# Patient Record
Sex: Female | Born: 1955
Health system: Southern US, Community
[De-identification: ages and names within clinical notes are randomized; demographics above are authoritative.]

## PROBLEM LIST (undated history)

## (undated) DIAGNOSIS — R7989 Other specified abnormal findings of blood chemistry: Secondary | ICD-10-CM

## (undated) DIAGNOSIS — C50919 Malignant neoplasm of unspecified site of unspecified female breast: Secondary | ICD-10-CM

## (undated) DIAGNOSIS — D509 Iron deficiency anemia, unspecified: Secondary | ICD-10-CM

## (undated) DIAGNOSIS — R87629 Unspecified abnormal cytological findings in specimens from vagina: Secondary | ICD-10-CM

## (undated) DIAGNOSIS — N809 Endometriosis, unspecified: Secondary | ICD-10-CM

## (undated) DIAGNOSIS — H409 Unspecified glaucoma: Secondary | ICD-10-CM

## (undated) DIAGNOSIS — Z9221 Personal history of antineoplastic chemotherapy: Secondary | ICD-10-CM

## (undated) DIAGNOSIS — R7309 Other abnormal glucose: Secondary | ICD-10-CM

## (undated) DIAGNOSIS — M1990 Unspecified osteoarthritis, unspecified site: Secondary | ICD-10-CM

## (undated) DIAGNOSIS — C73 Malignant neoplasm of thyroid gland: Secondary | ICD-10-CM

## (undated) DIAGNOSIS — B009 Herpesviral infection, unspecified: Secondary | ICD-10-CM

## (undated) DIAGNOSIS — T7840XA Allergy, unspecified, initial encounter: Secondary | ICD-10-CM

## (undated) DIAGNOSIS — D497 Neoplasm of unspecified behavior of endocrine glands and other parts of nervous system: Secondary | ICD-10-CM

## (undated) DIAGNOSIS — K219 Gastro-esophageal reflux disease without esophagitis: Secondary | ICD-10-CM

## (undated) DIAGNOSIS — G709 Myoneural disorder, unspecified: Secondary | ICD-10-CM

## (undated) DIAGNOSIS — Z923 Personal history of irradiation: Secondary | ICD-10-CM

## (undated) HISTORY — DX: Unspecified glaucoma: H40.9

## (undated) HISTORY — PX: TUBAL LIGATION: SHX77

## (undated) HISTORY — DX: Herpesviral infection, unspecified: B00.9

## (undated) HISTORY — DX: Allergy, unspecified, initial encounter: T78.40XA

## (undated) HISTORY — PX: COLOSTOMY REVERSAL: SHX5782

## (undated) HISTORY — DX: Endometriosis, unspecified: N80.9

## (undated) HISTORY — PX: OOPHORECTOMY: SHX86

## (undated) HISTORY — DX: Other abnormal glucose: R73.09

## (undated) HISTORY — PX: ABDOMINAL HYSTERECTOMY: SHX81

## (undated) HISTORY — PX: COLOSTOMY: SHX63

## (undated) HISTORY — PX: FOOT SURGERY: SHX648

## (undated) HISTORY — DX: Unspecified osteoarthritis, unspecified site: M19.90

## (undated) HISTORY — DX: Unspecified abnormal cytological findings in specimens from vagina: R87.629

## (undated) HISTORY — PX: SPINE SURGERY: SHX786

## (undated) HISTORY — DX: Other specified abnormal findings of blood chemistry: R79.89

## (undated) HISTORY — DX: Myoneural disorder, unspecified: G70.9

## (undated) HISTORY — DX: Malignant neoplasm of unspecified site of unspecified female breast: C50.919

## (undated) HISTORY — PX: SMALL INTESTINE SURGERY: SHX150

## (undated) HISTORY — PX: COLON SURGERY: SHX602

---

## 1998-02-09 ENCOUNTER — Other Ambulatory Visit: Admission: RE | Admit: 1998-02-09 | Discharge: 1998-02-09 | Payer: Self-pay | Admitting: Obstetrics and Gynecology

## 1998-03-09 ENCOUNTER — Ambulatory Visit (HOSPITAL_COMMUNITY): Admission: RE | Admit: 1998-03-09 | Discharge: 1998-03-09 | Payer: Self-pay | Admitting: Obstetrics and Gynecology

## 1998-03-16 ENCOUNTER — Encounter: Payer: Self-pay | Admitting: Obstetrics and Gynecology

## 1998-03-16 ENCOUNTER — Ambulatory Visit (HOSPITAL_COMMUNITY): Admission: RE | Admit: 1998-03-16 | Discharge: 1998-03-16 | Payer: Self-pay | Admitting: Obstetrics and Gynecology

## 1999-08-20 ENCOUNTER — Encounter: Admission: RE | Admit: 1999-08-20 | Discharge: 1999-11-18 | Payer: Self-pay | Admitting: Internal Medicine

## 2000-04-03 ENCOUNTER — Encounter: Admission: RE | Admit: 2000-04-03 | Discharge: 2000-04-03 | Payer: Self-pay | Admitting: Internal Medicine

## 2000-04-03 ENCOUNTER — Encounter: Payer: Self-pay | Admitting: Internal Medicine

## 2001-07-30 ENCOUNTER — Other Ambulatory Visit: Admission: RE | Admit: 2001-07-30 | Discharge: 2001-07-30 | Payer: Self-pay | Admitting: Internal Medicine

## 2001-08-13 ENCOUNTER — Encounter: Payer: Self-pay | Admitting: Internal Medicine

## 2001-08-13 ENCOUNTER — Encounter: Admission: RE | Admit: 2001-08-13 | Discharge: 2001-08-13 | Payer: Self-pay | Admitting: Internal Medicine

## 2002-09-13 ENCOUNTER — Other Ambulatory Visit: Admission: RE | Admit: 2002-09-13 | Discharge: 2002-09-13 | Payer: Self-pay | Admitting: Obstetrics and Gynecology

## 2002-10-01 ENCOUNTER — Encounter: Admission: RE | Admit: 2002-10-01 | Discharge: 2002-10-01 | Payer: Self-pay | Admitting: Obstetrics and Gynecology

## 2002-10-01 ENCOUNTER — Encounter: Payer: Self-pay | Admitting: Obstetrics and Gynecology

## 2003-01-16 ENCOUNTER — Encounter (INDEPENDENT_AMBULATORY_CARE_PROVIDER_SITE_OTHER): Payer: Self-pay | Admitting: *Deleted

## 2003-01-16 ENCOUNTER — Inpatient Hospital Stay (HOSPITAL_COMMUNITY): Admission: RE | Admit: 2003-01-16 | Discharge: 2003-01-19 | Payer: Self-pay | Admitting: Obstetrics and Gynecology

## 2005-02-11 ENCOUNTER — Emergency Department (HOSPITAL_COMMUNITY): Admission: EM | Admit: 2005-02-11 | Discharge: 2005-02-11 | Payer: Self-pay | Admitting: Emergency Medicine

## 2005-02-20 ENCOUNTER — Emergency Department (HOSPITAL_COMMUNITY): Admission: EM | Admit: 2005-02-20 | Discharge: 2005-02-20 | Payer: Self-pay | Admitting: Emergency Medicine

## 2006-01-25 ENCOUNTER — Encounter: Admission: RE | Admit: 2006-01-25 | Discharge: 2006-01-25 | Payer: Self-pay | Admitting: Internal Medicine

## 2008-02-20 ENCOUNTER — Encounter: Admission: RE | Admit: 2008-02-20 | Discharge: 2008-02-20 | Payer: Self-pay | Admitting: Internal Medicine

## 2010-07-16 NOTE — H&P (Signed)
NAME:  Madison Hopkins, Madison Hopkins                         ACCOUNT NO.:  [id number]   MEDICAL RECORD NO.:  [id number]                   PATIENT TYPE:  INP   LOCATION:  NA                                   [facility]   PHYSICIAN:  Dois Davenport A. Rivard, M.D.              DATE OF BIRTH:  1955/05/22   DATE OF ADMISSION:  01/16/2003  DATE OF DISCHARGE:                                HISTORY & PHYSICAL   HISTORY OF PRESENT ILLNESS:  This is a 55 year old, single, African American  woman, gravida 2, para 2, abortus 0, who was initially investigated on September 13, 2002, for menorrhagia.  She reported cycle every 28 days, lasting up to  14 days with 48 hours of very heavy flow, requiring one tampon and one pad  every hour and passing clots as large as 2 cm in diameter.  She also  reported dysmenorrhea starting 48 hours prior to menstrual flow and last 48  hours after with low pelvic pain of an intensity of 8/10 which was mildly  reduced with Anaprox 550 mg to 5/10.  She also reports occasional  dyspareunia, but denies breakthrough bleeding or postcoital bleeding.  She  also reports increased premenstrual syndrome with mood swings and diarrhea.  All of these symptoms, and especially the menorrhagia and dysmenorrhea, have  made her miss work two to three days a month.  She is currently not  sexually, but has had a previous bilateral tubal ligation.   A pelvic ultrasound performed on October 15, 2002, revealed a greatly  enlarged uterus measuring 15 cm x 9.1 cm x 8.7 cm with a normal endometrial  thickness of 0.8 cm.  Multiple fibroids were noted:  Mid uterus posteriorly  3.1 cm, anterior uterus, mid and lower uterine segment 2.8 cm, anterior  lower uterine segment 3.4 cm, anterior mid uterine body 6.4 cm.  Both  ovaries were essentially normal, but had small simple cysts of 3.3 on the  right side and 3.2 cm on the left side.  A mammogram in August of 2004 was  normal.  Complete blood work in July of 2004  revealed hemoglobin of 9.0 with  a hematocrit of 32.1.  The platelet count was increased at 488.  The total  cholesterol was normal.  TSH normal.  FSH 7.6.  Prolactin was normal.  Pap  smear on September 13, 2002, was within normal limits.   The patient was requesting definitive treatment and was started on Lupron  Depot where she received a dose of 11.25 mg on October 03, 2002.  She has been  amenorrheic since September of 2004 and her last hemoglobin at the [facility]  the first of November of 2004 was 11.3.  She is also currently using Niferex  Forte one tablet t.i.d.  She has prepared and given two units of blood for  postoperative autotransfusion.  The last unit was given on January 02, 2003.  Currently she is complaining of side effects of her Lupron Depot with hot  flashes and decreased energy.  These symptoms have improved in the last few  weeks.   REVIEW OF SYSTEMS:  CONSTITUTIONAL:  Negative.  HEAD, EYES, EARS, NOSE, AND  THROAT:  Negative.  CARDIOVASCULAR:  Negative.  GENITOURINARY:  Negative.  GASTROINTESTINAL:  Negative.  PSYCHIATRIC:  Negative.  NEUROLOGICAL:  Negative.   PAST MEDICAL HISTORY:  1. Spontaneous vaginal delivery x 2.  2. Status post bilateral tubal ligation.  3. Known for herpes simplex infection.  4. Known for presumed endometriosis.  5. History very remarkable for exploratory laparotomy in August of 1995     after a spontaneous vaginal delivery.  Three days postpartum, the patient     required a D&C for retained products of conception and subsequently     started complaining of abdominal pain with abnormal liver function tests.     Leonie Man, M.D., was consulted and performed exploratory laparotomy     where the patient was noted to have about 3000 mL of pus in her abdominal     cavity and the patient was found to have a perforation of the sigmoid     colon.  She subsequently had a sigmoid colostomy with a Hartmann pouch.     This colostomy was taken down  in January of 1996 without any     complications.   CURRENT MEDICATIONS:  Niferex t.i.d.   FAMILY HISTORY:  Mother deceased at age 46 of __________ liver cirrhosis.  Father deceased at age 9 of lung cancer.  Two brothers, 33 and 66 years  old, alive and well.  No history of feminine or colon cancer.   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  Lives alone with her 40-year-old daughter.  Nonsmoker, but reports  smoking marijuana on a regular basis, which she has discontinued in the last  two weeks and this is confidential.   PHYSICAL EXAMINATION:  WEIGHT:  Current weight is 191 pounds.  VITAL SIGNS:  Blood pressure 110/70.  HEENT:  Negative.  NECK:  Thyroid not enlarged.  HEART:  Regular rate and rhythm.  CHEST:  Clear.  BREASTS:  No masses, discharge, skin changes, or nipple retraction.  BACK:  No CVA tenderness.  ABDOMEN:  No tenderness, masses, organomegaly, or hepatosplenomegaly.  Midline incision noted.  EXTREMITIES:  Normal.  NEUROLOGIC:  Normal.  GYNECOLOGICAL:  Normal external genitalia and vagina.  The cervix is normal.  The uterus is increased in size and irregular compatible with 14-week size  uterus.  Adnexa are not felt.  RECTAL:  Exam is negative.   ASSESSMENT:  Large uterine fibroids in a patient with clinical menorrhagia  and anemia, status post Lupron Depot in August of 2004.  The patient is  requesting definitive surgical treatment and has opted for prophylactic  bilateral salpingo-oophorectomy.   PLAN:  The patient is admitted on January 16, 2003, to undergo total  abdominal hysterectomy with bilateral salpingo-oophorectomy.  The procedure  has been thoroughly reviewed with the patient, including possible risks of  bleeding, infection, injury to bowels, bladder, or ureters, surgical  menopause, and hormone replacement therapy risks and benefits.  The patient is given a full bowel preparation.  Crist Fat Rivard, M.D.    SAR/MEDQ  D:  01/13/2003  T:  01/13/2003  Job:  [id number]

## 2010-07-16 NOTE — Discharge Summary (Signed)
NAME:  Madison Hopkins, Madison Hopkins                         ACCOUNT NO.:  [id number]   MEDICAL RECORD NO.:  [id number]                   PATIENT TYPE:  INP   LOCATION:  9304                                 [facility]ILITY:  [facility]   PHYSICIAN:  Crist Fat. Rivard, M.D.              DATE OF BIRTH:  [birth year]   DATE OF ADMISSION:  01/16/2003  DATE OF DISCHARGE:  01/19/2003                                 DISCHARGE SUMMARY   DISCHARGE DIAGNOSES:  1. Uterine fibroids.  2. Menorrhagia.  3. Anemia.  4. Adenomyosis.  5. Endometriosis.  6. Pelvic adhesions.   OPERATION:  On the day of admission, the patient underwent a total abdominal  hysterectomy with bilateral salpingo-oophorectomy and lysis of adhesions  tolerating procedures well.   HISTORY OF PRESENT ILLNESS:  Madison Hopkins is a 55 year old single African  American female, gravida 2, para 2, abortus 0, who presents for hysterectomy  because of symptomatic uterine fibroids in the form of menorrhagia,  dysmenorrhea, and occasional dyspareunia.  Please see patient's dictated  history and physical examination for details.   PREOPERATIVE PHYSICAL EXAMINATION:  VITAL SIGNS: Weight 191 pounds, blood  pressure 110/70.  GENERAL: Within normal limits.  PELVIC EXAM: Normal external genitalia and vagina. The cervix was normal.  The uterus was increased in size and irregular compatible with a 14-week  size. Adnexae not felt. Rectovaginal exam negative.   HOSPITAL COURSE:  On the day of admission the patient underwent the  aforementioned procedures, tolerating them all well. Postoperative  hemoglobin was 9.2 (preoperative hemoglobin 10.0) and since the patient had  donated her blood prior to this procedure she received transfusion of two  units of her own blood on postoperative day #1.  Post transfusion hemoglobin  was 10.9. The remainder of the patient's hospital stay was marked by  persistent nausea; however, by postoperative day two the patient had resumed  bowel and bladder function. With administration of appropriate antiemetic  and further comfort measures, the patient had resolved her nausea by  postoperative day #3 and was therefore discharged home.   DISCHARGE MEDICATIONS:  1. Motrin 600 mg q.6h. for 10 days with food.  2. Tylox one or two tablets q.4-6h. p.r.n.   FOLLOWUP:  The patient is to be given a six weeks postoperative visit with  Dr. Estanislado Pandy and she was advised to keep this  appointment as previously  scheduled. However, she is to appear at [facility] on  Wednesday, January 22, 2003, for removal of her abdominal staples.  She is  to call the office to verify the exact time of that  appointment.   DISCHARGE INSTRUCTIONS:  The patient is advised to avoid tub bath for one  week, though she may shower. She is not to drive for two weeks, no heavy  lifting greater than 20 pounds for four weeks.  The patient's diet was  without restriction.   FINAL PATHOLOGY:  1.  Appendix biopsy of serosal nodule findings consistent with endometriosis;     2) uterus excision, polypoid leiomyoma and nonproliferative endometrium,     no hyperplasia or evidence of malignancy identified, extensive     adenomyosis, leiomyoma; serosa with features of endometriosis and     fibrovascular adhesions; mild chronic cervicitis, no atypia or dysplasia     identified; 3) right ovary and fallopian tube excision; serosal     fibrovascular adhesions of ovarian and fallopian tubes, ovarian serosal     endometriosis, endosalpingiosis, benign ovarian simple cysts, complete     transection of fallopian tubes, left ovary and fallopian tube excision,     ovarian adhesions, complete transection of fallopian tube, benign ovarian     corpus luteum, benign serous cyst; 4) appendix biopsy of serosal nodule,     endometriosis.     Madison Hopkins.                    Crist Fat Rivard, M.D.    EJP/MEDQ  D:  02/13/2003  T:  02/13/2003  Job:  [id number]

## 2010-07-16 NOTE — Op Note (Signed)
NAME:  Madison Hopkins, Madison Hopkins                         ACCOUNT NO.:  [id number]   MEDICAL RECORD NO.:  [id number]                   PATIENT TYPE:  INP   LOCATION:  9399                                 [facility]ILITY:  [facility]   PHYSICIAN:  Crist Fat. Rivard, M.D.              DATE OF BIRTH:  1955/12/14   DATE OF PROCEDURE:  01/16/2003  DATE OF DISCHARGE:                                 OPERATIVE REPORT   PREOPERATIVE DIAGNOSIS:  Uterine fibroids.   POSTOPERATIVE DIAGNOSES:  Uterine fibroids with pelvic adhesions and  endometriosis.   ANESTHESIA:  Marene Lenz T. Pamalee Leyden, M.D.   PROCEDURE:  Total abdominal hysterectomy with bilateral salpingo-  oophorectomy and removal of nodule on appendix, with lysis of adhesions.   SURGEON:  Crist Fat. Rivard, M.D.   ASSISTANT:  Naima A. Dillard, M.D.   ESTIMATED BLOOD LOSS:  200 mL.   After being informed of the planned procedure including possible  complications, such as bleeding, infection, injury to bowels, bladder, or  ureter, informed consent was obtained.  The patient is taken to OR #2, given  general anesthesia with endotracheal intubation without any complication.  She is placed in dorsal decubitus position, prepped and draped in a sterile  fashion, and a Foley catheter is inserted in her bladder.  We proceed with  infiltrating the midline incision using Marcaine 0.25% 20 mL and remove the  pre-existing midline scar from umbilicus to symphysis pubis.  The scar is  completely removed and the incision is brought down to the fascia, which is  opened first with knife, then with Mayo scissors, with an opening in the  abdomen protecting any injury to bowels.   Observation:  We note adhesion on the left side of the incision with the  omentum as well as adhesions in the pelvis, which involve two-thirds of the  ovary, anterior, fundal, and posterior on the left side, with omentum as  well as bowel.  The right side has small adherence between the appendix  and  the right tube as well as adherence between the cecum and the right tube.  The posterior cul-de-sac is partially obliterated with dense adhesions with  the bowel.   We then proceed with systematic lysis of adhesions sharply with traction and  counter traction, and we are able to free the uterus almost entirely.  We  are now able to identify round ligament on both sides, right tube, right  ovary.  The left tube is identified but the left ovary is still deeply  involved with dense adhesion on the left pelvic wall.  We are still able to  start to proceed with the planned procedure by isolating round ligaments on  both sides, suturing with a transfix suture of 0 Vicryl, and sectioning.  This will allow [place] easy entry to the retroperitoneum on the right side,  identifying the right ureter, and being able to clamp the infundibulopelvic  ligament  under direct visualization with Rogers clamps.  This ligament is  then sectioned and doubly sutured, first with a transfix suture of 0 Vicryl,  then with a free tie of 0 Vicryl.  We can now sharply dissect the adhesions  with the appendix as well as the cecum and completely free the adnexa.  During that process a 1 cm nodule is identified at the serosal surface at  the junction of the appendix and the cecum, and a superficial biopsy is sent  for frozen section, which comes back as possible endometriosis.  This nodule  is then completely removed with sharp dissection.  The serosa covering it is  then sutured with a running suture of 3-0 silk.  Hemostasis is adequate.  We  now put our attention on the left adnexa, and we eventually are able to open  the left retroperitoneal space, identify the left ureter, and clamp the  infundibulopelvic ligament with two Rogers clamps, sectioned, and doubly  sutured with a transfix suture of 0 Vicryl and a free tie of 0 Vicryl.  The  adnexa is then completely freed from the pelvic wall with sharp dissection.  We are  then able to skeletonize the uterine arteries, clamp those arteries,  sectioned and doubly sutured with a 0 Vicryl, and a clamp is placed on each  side on the cardinal ligament, which is then sectioned and sutured with a  transfix suture of 0 Vicryl.  At this point it is impossible to proceed with  the removal of the cervix due to the dense adhesion in the posterior cul-de-  sac, and the body of the uterus is then removed for better exposure.   We then proceed with sharp and blunt dissection of the posterior cul-de-sac  and are able to bring down the bowel in order to safely retract and identify  the posterior cul-de-sac.  Please note that previously after opening the  round ligament, the broad ligament was already opened from lysis of  adhesions and the bladder was already dissected down and freeing the  anterior aspect of the cervix.  We can now proceed with clamping the  uterosacral and cardinal ligaments on both sides with Rogers clamps,  sectioning those ligaments and suturing with a transfix suture of 0 Vicryl,  which subsequently opens the vaginal vault, and we are able to remove the  cervical stump completely.  Each vaginal angle is then sutured with a figure-  of-eight stitch of 0 Vicryl, which is attached to the corresponding ligament  for suspension.  The anterior and posterior lips of the vaginal vault are  then hemostatically sutured with a running suture of 0 Vicryl, and the  vaginal vault is closed with a figure-of-eight stitch of 0 Vicryl.  We then  irrigate profusely with warm saline, complete hemostasis on the posterior  aspect of the vaginal vault with cautery.  We note that the bowel is intact.  We also identify both ureters, which show normal peristalsis and no  dilatation.  Hemostasis is assessed systematically on all pedicles and is  felt to be adequate.  We then place Gelfoam in the posterior cul-de-sac just for small minimal oozing.  Instruments are removed, sponges  are removed, and  we proceed with closure of the incision.  Using two running sutures of 0 PDS  meeting midline, we close the fascia.  The wound is irrigated with warm  saline and a #10 flat JP drain is placed in the incision, held with a right  counter incision with 3-0 silk.  Skin is closed with staples.   Instrument and sponge count is complete x2.  Estimated blood loss is 200 mL.  The procedure is well-tolerated by the patient, who is taken to the recovery  room in a well and stable condition.                                               Crist Fat Rivard, M.D.    SAR/MEDQ  D:  01/16/2003  T:  01/17/2003  Job:  [id number]

## 2011-08-30 ENCOUNTER — Ambulatory Visit (INDEPENDENT_AMBULATORY_CARE_PROVIDER_SITE_OTHER): Payer: 59 | Admitting: Family Medicine

## 2011-08-30 VITALS — BP 138/82 | HR 80 | Temp 98.2°F | Resp 16 | Ht 69.0 in | Wt 205.0 lb

## 2011-08-30 DIAGNOSIS — L0390 Cellulitis, unspecified: Secondary | ICD-10-CM

## 2011-08-30 DIAGNOSIS — W57XXXA Bitten or stung by nonvenomous insect and other nonvenomous arthropods, initial encounter: Secondary | ICD-10-CM

## 2011-08-30 DIAGNOSIS — L0291 Cutaneous abscess, unspecified: Secondary | ICD-10-CM

## 2011-08-30 DIAGNOSIS — T148 Other injury of unspecified body region: Secondary | ICD-10-CM

## 2011-08-30 MED ORDER — METHYLPREDNISOLONE ACETATE 80 MG/ML IJ SUSP
80.0000 mg | Freq: Once | INTRAMUSCULAR | Status: AC
  Administered 2011-08-30: 80 mg via INTRAMUSCULAR

## 2011-08-30 MED ORDER — DOXYCYCLINE HYCLATE 100 MG PO TABS: 100.0000 mg | ORAL_TABLET | Freq: Two times a day (BID) | ORAL | Status: AC

## 2011-08-30 NOTE — Progress Notes (Signed)
Subjective: Went to an outside she had to get something and felt a bite or sting on the back of her right arm on Saturday. He has developed a red patch. It itches some now. Did not see a tick on herself.  Objective:  6 or 8 cm diameter erythematous lesion, well-circumscribed, on the back of her right arm adjacent to the axilla. A bite is visible in the center of it.  Assessment: Insect bite or sting with possible cellulitis  Plan: Depo-Medrol 80 IM Claritin Doxycycline 100 twice a day Return if problems

## 2011-08-30 NOTE — Patient Instructions (Signed)
Take Claritin 1 daily Take the doxycycline twice daily for infection. It can cause she does sunburn easily her, so be careful of your out in the sun to use sunscreen. Return if worse.

## 2011-09-19 ENCOUNTER — Ambulatory Visit (INDEPENDENT_AMBULATORY_CARE_PROVIDER_SITE_OTHER): Payer: 59 | Admitting: Family Medicine

## 2011-09-19 VITALS — BP 128/82 | HR 78 | Temp 98.4°F | Resp 16 | Ht 69.0 in | Wt 203.2 lb

## 2011-09-19 DIAGNOSIS — H01119 Allergic dermatitis of unspecified eye, unspecified eyelid: Secondary | ICD-10-CM

## 2011-09-19 DIAGNOSIS — Z91030 Bee allergy status: Secondary | ICD-10-CM

## 2011-09-19 DIAGNOSIS — Z91038 Other insect allergy status: Secondary | ICD-10-CM

## 2011-09-19 MED ORDER — METHYLPREDNISOLONE ACETATE 80 MG/ML IJ SUSP
80.0000 mg | Freq: Once | INTRAMUSCULAR | Status: AC
  Administered 2011-09-19: 80 mg via INTRAMUSCULAR

## 2011-09-19 NOTE — Patient Instructions (Addendum)
Continue claritin  Return or call if worse

## 2011-09-19 NOTE — Progress Notes (Signed)
Subjective: Patient got stung by some small Hornets last night, attacking her face. Both eyelids were affected, right worse than the left. This morning she or her lids were badly swollen despite the fact she did take some Claritin.  Objective: Very swollen right orbit with moderate swelling of the left. Eyes are noninflamed, pupils react normally, fundi benign.  Assessment: Bee sting and allergic blepharitis secondary to that  Plan: Depo-Medrol 80. Continue the Claritin. Call or return if worse.

## 2013-05-21 ENCOUNTER — Other Ambulatory Visit: Payer: Self-pay

## 2013-05-21 DIAGNOSIS — Z1231 Encounter for screening mammogram for malignant neoplasm of breast: Secondary | ICD-10-CM

## 2013-06-10 ENCOUNTER — Ambulatory Visit
Admission: RE | Admit: 2013-06-10 | Discharge: 2013-06-10 | Disposition: A | Discharge status: Home / Self Care | Payer: 59 | Source: Ambulatory Visit

## 2013-06-10 DIAGNOSIS — Z1231 Encounter for screening mammogram for malignant neoplasm of breast: Secondary | ICD-10-CM

## 2013-06-12 ENCOUNTER — Other Ambulatory Visit: Payer: Self-pay | Admitting: Internal Medicine

## 2013-06-12 DIAGNOSIS — R928 Other abnormal and inconclusive findings on diagnostic imaging of breast: Secondary | ICD-10-CM

## 2013-06-20 ENCOUNTER — Ambulatory Visit
Admission: RE | Admit: 2013-06-20 | Discharge: 2013-06-20 | Disposition: A | Discharge status: Home / Self Care | Payer: 59 | Source: Ambulatory Visit | Attending: Internal Medicine | Admitting: Internal Medicine

## 2013-06-20 DIAGNOSIS — R928 Other abnormal and inconclusive findings on diagnostic imaging of breast: Secondary | ICD-10-CM

## 2014-05-02 ENCOUNTER — Ambulatory Visit (INDEPENDENT_AMBULATORY_CARE_PROVIDER_SITE_OTHER): Payer: 59 | Admitting: Family Medicine

## 2014-05-02 VITALS — BP 120/72 | HR 100 | Temp 100.0°F | Resp 18 | Ht 69.5 in | Wt 200.0 lb

## 2014-05-02 DIAGNOSIS — R05 Cough: Secondary | ICD-10-CM

## 2014-05-02 DIAGNOSIS — R509 Fever, unspecified: Secondary | ICD-10-CM

## 2014-05-02 DIAGNOSIS — J1189 Influenza due to unidentified influenza virus with other manifestations: Secondary | ICD-10-CM | POA: Diagnosis not present

## 2014-05-02 DIAGNOSIS — J029 Acute pharyngitis, unspecified: Secondary | ICD-10-CM

## 2014-05-02 DIAGNOSIS — J111 Influenza due to unidentified influenza virus with other respiratory manifestations: Secondary | ICD-10-CM

## 2014-05-02 DIAGNOSIS — R059 Cough, unspecified: Secondary | ICD-10-CM

## 2014-05-02 LAB — POCT CBC
Granulocyte percent: 80.6 %G — AB (ref 37–80)
HCT, POC: 36.7 % — AB (ref 37.7–47.9)
Hemoglobin: 12 g/dL — AB (ref 12.2–16.2)
Lymph, poc: 0.7 (ref 0.6–3.4)
MCH, POC: 26.3 pg — AB (ref 27–31.2)
MCHC: 32.8 g/dL (ref 31.8–35.4)
MCV: 80.2 fL (ref 80–97)
MID (cbc): 0.5 (ref 0–0.9)
MPV: 7.1 fL (ref 0–99.8)
POC Granulocyte: 5.2 (ref 2–6.9)
POC LYMPH PERCENT: 11.3 %L (ref 10–50)
POC MID %: 8.1 %M (ref 0–12)
Platelet Count, POC: 293 10*3/uL (ref 142–424)
RBC: 4.57 M/uL (ref 4.04–5.48)
RDW, POC: 14.1 %
WBC: 6.4 10*3/uL (ref 4.6–10.2)

## 2014-05-02 LAB — POCT INFLUENZA A/B
Influenza A, POC: NEGATIVE
Influenza B, POC: NEGATIVE

## 2014-05-02 LAB — POCT RAPID STREP A (OFFICE): Rapid Strep A Screen: NEGATIVE

## 2014-05-02 MED ORDER — IBUPROFEN 200 MG PO TABS
400.0000 mg | ORAL_TABLET | Freq: Once | ORAL | Status: AC
  Administered 2014-05-02: 400 mg via ORAL

## 2014-05-02 MED ORDER — HYDROCODONE-HOMATROPINE 5-1.5 MG/5ML PO SYRP: ORAL_SOLUTION | ORAL | Status: DC

## 2014-05-02 MED ORDER — OSELTAMIVIR PHOSPHATE 75 MG PO CAPS: 75.0000 mg | ORAL_CAPSULE | Freq: Two times a day (BID) | ORAL | Status: DC

## 2014-05-02 NOTE — Patient Instructions (Signed)
Saline nasal spray atleast 4 times per day, over the counter mucinex or mucinex DM or hydrocodone if needed for cough. Drink plenty of fluids.  Return to the clinic or go to the nearest emergency room if any of your symptoms worsen or new symptoms occur.  Influenza Influenza ("the flu") is a viral infection of the respiratory tract. It occurs more often in winter months because people spend more time in close contact with one another. Influenza can make you feel very sick. Influenza easily spreads from person to person (contagious). CAUSES  Influenza is caused by a virus that infects the respiratory tract. You can catch the virus by breathing in droplets from an infected person's cough or sneeze. You can also catch the virus by touching something that was recently contaminated with the virus and then touching your mouth, nose, or eyes. RISKS AND COMPLICATIONS You may be at risk for a more severe case of influenza if you smoke cigarettes, have diabetes, have chronic heart disease (such as heart failure) or lung disease (such as asthma), or if you have a weakened immune system. Elderly people and pregnant women are also at risk for more serious infections. The most common problem of influenza is a lung infection (pneumonia). Sometimes, this problem can require emergency medical care and may be life threatening. SIGNS AND SYMPTOMS  Symptoms typically last 4 to 10 days and may include:  Fever.  Chills.  Headache, body aches, and muscle aches.  Sore throat.  Chest discomfort and cough.  Poor appetite.  Weakness or feeling tired.  Dizziness.  Nausea or vomiting. DIAGNOSIS  Diagnosis of influenza is often made based on your history and a physical exam. A nose or throat swab test can be done to confirm the diagnosis. TREATMENT  In mild cases, influenza goes away on its own. Treatment is directed at relieving symptoms. For more severe cases, your health care provider may prescribe antiviral  medicines to shorten the sickness. Antibiotic medicines are not effective because the infection is caused by a virus, not by bacteria. HOME CARE INSTRUCTIONS  Take medicines only as directed by your health care provider.  Use a cool mist humidifier to make breathing easier.  Get plenty of rest until your temperature returns to normal. This usually takes 3 to 4 days.  Drink enough fluid to keep your urine clear or pale yellow.  Cover yourmouth and nosewhen coughing or sneezing,and wash your handswellto prevent thevirusfrom spreading.  Stay homefromwork orschool untilthe fever is gonefor at least 88full day. PREVENTION  An annual influenza vaccination (flu shot) is the best way to avoid getting influenza. An annual flu shot is now routinely recommended for all adults in the [place] IF:  You experiencechest pain, yourcough worsens,or you producemore mucus.  Youhave nausea,vomiting, ordiarrhea.  Your fever returns or gets worse. SEEK IMMEDIATE MEDICAL CARE IF:  You havetrouble breathing, you become short of breath,or your skin ornails becomebluish.  You have severe painor stiffnessin the neck.  You develop a sudden headache, or pain in the face or ear.  You have nausea or vomiting that you cannot control. MAKE SURE YOU:   Understand these instructions.  Will watch your condition.  Will get help right away if you are not doing well or get worse. Document Released: 02/12/2000 Document Revised: 07/01/2013 Document Reviewed: 05/16/2011 [facility] Patient Information 2015 [place], [place]. This information is not intended to replace advice given to you by your health care provider. Make sure you discuss any questions  you have with your health care provider.

## 2014-05-02 NOTE — Progress Notes (Addendum)
This chart was scribed for Merri Ray, MD by Einar Pheasant, ED Scribe. This patient was seen in room 4 and the patient's care was started at 12:38 PM.  Subjective:    Patient ID: Madison Hopkins, female    DOB: 14-Apr-1955, 59 y.o.   MRN: [id number]  Chief Complaint  Patient presents with  . Ear Pain    symptoms started yesturday   . Generalized Body Aches  . Cough  . Chills  . Abdominal Pain  . lack of appetite    HPI Madison Hopkins is a 59 y.o. female with no record of flu vaccine this season.   Pt states that she started feeling bad yesterday. She was seen on Wednesday by her PCP, Dr. Quentin Cornwall. At the time all she had was a scratchy throat but she attributed it to her seasonal allergies. However, yesterday she started to feel really bad with symptoms of productive cough with yellow colored sputum, subjective fever, generalized myalgias, abdominal pain, 2 episodes of diarrhea, chills, and decreased appetite. Current office temperature is 102.9. She endorses a mild sore throat that radiates up to bilateral ears. Pt states that she has been keeping hydrated. She endorses multiple contacts at work. Pt denies any h/o of lung problems, nausea, or emesis.  There are no active problems to display for this patient.  Past Medical History  Diagnosis Date  . Allergy    Past Surgical History  Procedure Laterality Date  . Colon surgery     Allergies  Allergen Reactions  . Penicillins    Prior to Admission medications   Not on File   History   Social History  . Marital Status: Single    Spouse Name: N/A  . Number of Children: N/A  . Years of Education: N/A   Occupational History  . Not on file.   Social History Main Topics  . Smoking status: Never Smoker   . Smokeless tobacco: Not on file  . Alcohol Use: 0.0 oz/week    0 Standard drinks or equivalent per week  . Drug Use: No  . Sexual Activity: Not on file   Other Topics Concern  . Not on file   Social History  Narrative     Review of Systems  Constitutional: Positive for fever and chills. Negative for fatigue and unexpected weight change.  HENT: Positive for congestion and ear pain. Negative for rhinorrhea, sinus pressure, sore throat, trouble swallowing and voice change.   Eyes: Negative for discharge.  Respiratory: Positive for cough. Negative for chest tightness, shortness of breath, wheezing and stridor.   Cardiovascular: Negative for chest pain, palpitations and leg swelling.  Gastrointestinal: Positive for abdominal pain. Negative for blood in stool.  Genitourinary: Negative.   Musculoskeletal: Positive for myalgias.  Neurological: Negative for dizziness, syncope, light-headedness and headaches.       Objective:   Physical Exam  Constitutional: She is oriented to person, place, and time. She appears well-developed and well-nourished.  HENT:  Head: Normocephalic and atraumatic.  Right Ear: External ear normal.  Left Ear: External ear normal.  Mouth/Throat: Oropharynx is clear and moist. No oropharyngeal exudate.  Eyes: Conjunctivae and EOM are normal. Pupils are equal, round, and reactive to light.  Neck: Carotid bruit is not present.  Cardiovascular: Normal rate, regular rhythm, normal heart sounds and intact distal pulses.   Pulmonary/Chest: Effort normal and breath sounds normal.  Abdominal: Soft. She exhibits no distension and no pulsatile midline mass. There is no tenderness.  Neurological:  She is alert and oriented to person, place, and time.  Skin: Skin is warm and dry.  Psychiatric: She has a normal mood and affect. Her behavior is normal.  Vitals reviewed.  Filed Vitals:   05/02/14 1215  BP: 120/72  Pulse: 100  Temp: 102.9 F (39.4 C)  TempSrc: Oral  Resp: 18  Height: 5' 9.5" (1.765 m)  Weight: 200 lb (90.719 kg)  SpO2: 96%   Results for orders placed or performed in visit on 05/02/14  POCT rapid strep A  Result Value Ref Range   Rapid Strep A Screen Negative  Negative  POCT Influenza A/B  Result Value Ref Range   Influenza A, POC Negative    Influenza B, POC Negative   POCT CBC  Result Value Ref Range   WBC 6.4 4.6 - 10.2 K/uL   Lymph, poc 0.7 0.6 - 3.4   POC LYMPH PERCENT 11.3 10 - 50 %L   MID (cbc) 0.5 0 - 0.9   POC MID % 8.1 0 - 12 %M   POC Granulocyte 5.2 2 - 6.9   Granulocyte percent 80.6 (A) 37 - 80 %G   RBC 4.57 4.04 - 5.48 M/uL   Hemoglobin 12.0 (A) 12.2 - 16.2 g/dL   HCT, POC 36.7 (A) 37.7 - 47.9 %   MCV 80.2 80 - 97 fL   MCH, POC 26.3 (A) 27 - 31.2 pg   MCHC 32.8 31.8 - 35.4 g/dL   RDW, POC 14.1 %   Platelet Count, POC 293 142 - 424 K/uL   MPV 7.1 0 - 99.8 fL     Assessment & Plan:   Madison Hopkins is a 59 y.o. female Fever, unspecified fever cause - Plan: POCT rapid strep A, POCT Influenza A/B, ibuprofen (ADVIL,MOTRIN) tablet 400 mg, POCT CBC  Sore throat - Plan: POCT rapid strep A, POCT Influenza A/B, Culture, Group A Strep  Cough - Plan: POCT rapid strep A, POCT Influenza A/B, HYDROcodone-homatropine (HYCODAN) 5-1.5 MG/5ML syrup  Influenza with respiratory manifestations - Plan: oseltamivir (TAMIFLU) 75 MG capsule  Suspected influenza/influena like illness.  Discussed pros and cons of Tamiflu, including cost, but she would like rx - printed if she wanted to fill this. Cough treatment with mucinex during day, hycodan if needed - especially at night. Sx care with fluids, rest and contact precautions.   Meds ordered this encounter  Medications  . ibuprofen (ADVIL,MOTRIN) tablet 400 mg    Sig:   . oseltamivir (TAMIFLU) 75 MG capsule    Sig: Take 1 capsule (75 mg total) by mouth 2 (two) times daily.    Dispense:  10 capsule    Refill:  0  . HYDROcodone-homatropine (HYCODAN) 5-1.5 MG/5ML syrup    Sig: 34m by mouth a bedtime as needed for cough.    Dispense:  120 mL    Refill:  0   Patient Instructions  Saline nasal spray atleast 4 times per day, over the counter mucinex or mucinex DM or hydrocodone if needed for  cough. Drink plenty of fluids.  Return to the clinic or go to the nearest emergency room if any of your symptoms worsen or new symptoms occur.  Influenza Influenza ("the flu") is a viral infection of the respiratory tract. It occurs more often in winter months because people spend more time in close contact with one another. Influenza can make you feel very sick. Influenza easily spreads from person to person (contagious). CAUSES  Influenza is caused by a virus that  infects the respiratory tract. You can catch the virus by breathing in droplets from an infected person's cough or sneeze. You can also catch the virus by touching something that was recently contaminated with the virus and then touching your mouth, nose, or eyes. RISKS AND COMPLICATIONS You may be at risk for a more severe case of influenza if you smoke cigarettes, have diabetes, have chronic heart disease (such as heart failure) or lung disease (such as asthma), or if you have a weakened immune system. Elderly people and pregnant women are also at risk for more serious infections. The most common problem of influenza is a lung infection (pneumonia). Sometimes, this problem can require emergency medical care and may be life threatening. SIGNS AND SYMPTOMS  Symptoms typically last 4 to 10 days and may include:  Fever.  Chills.  Headache, body aches, and muscle aches.  Sore throat.  Chest discomfort and cough.  Poor appetite.  Weakness or feeling tired.  Dizziness.  Nausea or vomiting. DIAGNOSIS  Diagnosis of influenza is often made based on your history and a physical exam. A nose or throat swab test can be done to confirm the diagnosis. TREATMENT  In mild cases, influenza goes away on its own. Treatment is directed at relieving symptoms. For more severe cases, your health care provider may prescribe antiviral medicines to shorten the sickness. Antibiotic medicines are not effective because the infection is caused by a  virus, not by bacteria. HOME CARE INSTRUCTIONS  Take medicines only as directed by your health care provider.  Use a cool mist humidifier to make breathing easier.  Get plenty of rest until your temperature returns to normal. This usually takes 3 to 4 days.  Drink enough fluid to keep your urine clear or pale yellow.  Cover yourmouth and nosewhen coughing or sneezing,and wash your handswellto prevent thevirusfrom spreading.  Stay homefromwork orschool untilthe fever is gonefor at least 42full day. PREVENTION  An annual influenza vaccination (flu shot) is the best way to avoid getting influenza. An annual flu shot is now routinely recommended for all adults in the [place] IF:  You experiencechest pain, yourcough worsens,or you producemore mucus.  Youhave nausea,vomiting, ordiarrhea.  Your fever returns or gets worse. SEEK IMMEDIATE MEDICAL CARE IF:  You havetrouble breathing, you become short of breath,or your skin ornails becomebluish.  You have severe painor stiffnessin the neck.  You develop a sudden headache, or pain in the face or ear.  You have nausea or vomiting that you cannot control. MAKE SURE YOU:   Understand these instructions.  Will watch your condition.  Will get help right away if you are not doing well or get worse. Document Released: 02/12/2000 Document Revised: 07/01/2013 Document Reviewed: 05/16/2011 [facility] Patient Information 2015 [place], [place]. This information is not intended to replace advice given to you by your health care provider. Make sure you discuss any questions you have with your health care provider.      I personally performed the services described in this documentation, which was scribed in my presence. The recorded information has been reviewed and considered, and addended by me as needed.

## 2014-05-04 ENCOUNTER — Telehealth: Payer: Self-pay

## 2014-05-04 LAB — CULTURE, GROUP A STREP: Organism ID, Bacteria: NORMAL

## 2014-05-04 NOTE — Telephone Encounter (Signed)
The patient called to report that she is having headache pain on the top moving to the right side of her head and chest pain from excessive coughing.  The patient requests return call to discuss these symptoms and to see if she needs different medication.  The patient also expressed concern that she is not feeling better and she is supposed to return to work on Tuesday 05/06/14-the patient states she may need an extra recovery day.  Please call the patient to discuss at [phone].  Marking high priority due to need for doctor note for work.

## 2014-05-05 NOTE — Telephone Encounter (Signed)
lmom to cb. 

## 2014-05-05 NOTE — Telephone Encounter (Signed)
Ok to provide her a note out for tomorrow (Tuesday) but if not improving by Wednesday - recommend recheck. Sooner if worse.

## 2014-05-05 NOTE — Telephone Encounter (Signed)
Pt keeps coughing, putting pressure on her head. She doesn't like the hydrocodone. She went and got some Robitussin. Pt states she actually feels a lot better today, but she needs to be oow again tomorrow.

## 2014-05-06 NOTE — Telephone Encounter (Signed)
Spoke with pt, advised message from Dr. Carlota Raspberry. Note written and need fax number to her employer. Pt will call back with number.

## 2014-05-06 NOTE — Telephone Encounter (Signed)
Spoke with pt, faxed note to her employer.

## 2015-06-09 ENCOUNTER — Other Ambulatory Visit: Payer: Self-pay

## 2015-06-09 DIAGNOSIS — Z1231 Encounter for screening mammogram for malignant neoplasm of breast: Secondary | ICD-10-CM

## 2015-07-06 ENCOUNTER — Ambulatory Visit
Admission: RE | Admit: 2015-07-06 | Discharge: 2015-07-06 | Disposition: A | Discharge status: Home / Self Care | Payer: Commercial Managed Care - HMO | Source: Ambulatory Visit

## 2015-07-06 DIAGNOSIS — Z1231 Encounter for screening mammogram for malignant neoplasm of breast: Secondary | ICD-10-CM

## 2015-07-08 ENCOUNTER — Other Ambulatory Visit: Payer: Self-pay | Admitting: Internal Medicine

## 2015-07-08 DIAGNOSIS — R928 Other abnormal and inconclusive findings on diagnostic imaging of breast: Secondary | ICD-10-CM

## 2015-07-16 ENCOUNTER — Ambulatory Visit
Admission: RE | Admit: 2015-07-16 | Discharge: 2015-07-16 | Disposition: A | Discharge status: Home / Self Care | Payer: Commercial Managed Care - HMO | Source: Ambulatory Visit | Attending: Internal Medicine | Admitting: Internal Medicine

## 2015-07-16 DIAGNOSIS — R928 Other abnormal and inconclusive findings on diagnostic imaging of breast: Secondary | ICD-10-CM

## 2016-02-29 DIAGNOSIS — C50919 Malignant neoplasm of unspecified site of unspecified female breast: Secondary | ICD-10-CM

## 2016-02-29 HISTORY — DX: Malignant neoplasm of unspecified site of unspecified female breast: C50.919

## 2016-03-25 DIAGNOSIS — M5412 Radiculopathy, cervical region: Secondary | ICD-10-CM | POA: Diagnosis not present

## 2016-03-25 DIAGNOSIS — M542 Cervicalgia: Secondary | ICD-10-CM | POA: Diagnosis not present

## 2016-04-13 DIAGNOSIS — L309 Dermatitis, unspecified: Secondary | ICD-10-CM | POA: Diagnosis not present

## 2016-04-25 DIAGNOSIS — M25561 Pain in right knee: Secondary | ICD-10-CM | POA: Diagnosis not present

## 2016-05-11 DIAGNOSIS — N760 Acute vaginitis: Secondary | ICD-10-CM | POA: Diagnosis not present

## 2016-05-11 DIAGNOSIS — R109 Unspecified abdominal pain: Secondary | ICD-10-CM | POA: Diagnosis not present

## 2016-07-28 DIAGNOSIS — R7309 Other abnormal glucose: Secondary | ICD-10-CM

## 2016-07-28 DIAGNOSIS — K589 Irritable bowel syndrome without diarrhea: Secondary | ICD-10-CM | POA: Diagnosis not present

## 2016-07-28 DIAGNOSIS — N760 Acute vaginitis: Secondary | ICD-10-CM | POA: Diagnosis not present

## 2016-07-28 DIAGNOSIS — Z0000 Encounter for general adult medical examination without abnormal findings: Secondary | ICD-10-CM | POA: Diagnosis not present

## 2016-07-28 DIAGNOSIS — Z23 Encounter for immunization: Secondary | ICD-10-CM | POA: Diagnosis not present

## 2016-07-28 DIAGNOSIS — R87629 Unspecified abnormal cytological findings in specimens from vagina: Secondary | ICD-10-CM

## 2016-07-28 DIAGNOSIS — R7989 Other specified abnormal findings of blood chemistry: Secondary | ICD-10-CM

## 2016-07-28 DIAGNOSIS — Z01419 Encounter for gynecological examination (general) (routine) without abnormal findings: Secondary | ICD-10-CM | POA: Diagnosis not present

## 2016-07-28 HISTORY — DX: Other specified abnormal findings of blood chemistry: R79.89

## 2016-07-28 HISTORY — DX: Unspecified abnormal cytological findings in specimens from vagina: R87.629

## 2016-07-28 HISTORY — DX: Other abnormal glucose: R73.09

## 2016-07-28 LAB — VITAMIN D 25 HYDROXY (VIT D DEFICIENCY, FRACTURES): Vit D, 25-Hydroxy: 24.7

## 2016-07-28 LAB — HM PAP SMEAR

## 2016-08-15 ENCOUNTER — Encounter: Payer: Self-pay | Admitting: Physician Assistant

## 2016-08-15 ENCOUNTER — Ambulatory Visit (INDEPENDENT_AMBULATORY_CARE_PROVIDER_SITE_OTHER): Payer: 59 | Admitting: Physician Assistant

## 2016-08-15 DIAGNOSIS — N898 Other specified noninflammatory disorders of vagina: Secondary | ICD-10-CM

## 2016-08-15 DIAGNOSIS — Z113 Encounter for screening for infections with a predominantly sexual mode of transmission: Secondary | ICD-10-CM

## 2016-08-15 DIAGNOSIS — R350 Frequency of micturition: Secondary | ICD-10-CM | POA: Diagnosis not present

## 2016-08-15 LAB — POC MICROSCOPIC URINALYSIS (UMFC): Mucus: ABSENT

## 2016-08-15 LAB — POCT URINALYSIS DIP (MANUAL ENTRY)
Bilirubin, UA: NEGATIVE
Blood, UA: NEGATIVE
Glucose, UA: NEGATIVE mg/dL
Leukocytes, UA: NEGATIVE
Nitrite, UA: NEGATIVE
Spec Grav, UA: 1.025 (ref 1.010–1.025)
Urobilinogen, UA: 0.2 E.U./dL
pH, UA: 6 (ref 5.0–8.0)

## 2016-08-15 LAB — POCT WET + KOH PREP
Trich by wet prep: ABSENT
Yeast by KOH: ABSENT
Yeast by wet prep: ABSENT

## 2016-08-15 NOTE — Progress Notes (Signed)
08/15/2016 at 6:25 PM  Lyla Glassing / DOB: Jul 18, 1955 / MRN: [id number]  The patient  does not have a problem list on file.  SUBJECTIVE  KYNLEA BLACKSTON is a 61 y.o. female who complains of vaginal dryness, urinary frequency, suprapubic pressure , and urinary hesitancy x 1 month.  She denies dysuria, hematuria, flank pain, cloudy malordorous urine, genital rash and genital irritation. Has tried boric acid suppositories with no relief. She is sexually active, used a condom about a month ago and notes she has noticed the symptoms since then. She is concerned for an STD.  Most recent STD testing was >9 years. Has HSV 2. Has not had a flare up in a long time. PCP is Dr. Baird Cancer. She has already seen her for this but wants a second opinion. Pt had hysterectomy >20 years ago.    She  has a past medical history of Allergy.    Medications reviewed and updated by myself where necessary, and exist elsewhere in the encounter.   Ms. Sanzone is allergic to penicillins. She  reports that she has never smoked. She has never used smokeless tobacco. She reports that she drinks alcohol. She reports that she uses drugs, including Marijuana. She  has no sexual activity history on file. The patient  has a past surgical history that includes Colon surgery; Cesarean section; Abdominal hysterectomy; Small intestine surgery; and Spine surgery.  Her family history includes Cancer in her father; Mental illness in her mother.  Review of Systems  Constitutional: Negative for chills, diaphoresis and fever.  Gastrointestinal: Negative for nausea and vomiting.    OBJECTIVE  Her  vitals were not taken for this visit. The patient's body mass index is unknown because there is no height or weight on file.  Physical Exam  Constitutional: She is oriented to person, place, and time. She appears well-developed and well-nourished. No distress.  HENT:  Head: Normocephalic and atraumatic.  Eyes: Pupils are equal, round, and  reactive to light.  Neck: Normal range of motion.  GI: Soft. Bowel sounds are normal. There is no tenderness.  Genitourinary: There is no rash on the right labia. There is no rash on the left labia. Vaginal discharge (mild creamy white discharge noted) found.  Musculoskeletal: Normal range of motion.  Neurological: She is alert and oriented to person, place, and time.  Skin: Skin is warm and dry.  Psychiatric: She has a normal mood and affect.    Results for orders placed or performed in visit on 08/15/16 (from the past 24 hour(s))  POCT urinalysis dipstick     Status: Abnormal   Collection Time: 08/15/16  5:32 PM  Result Value Ref Range   Color, UA yellow yellow   Clarity, UA clear clear   Glucose, UA negative negative mg/dL   Bilirubin, UA negative negative   Ketones, POC UA trace (5) (A) negative mg/dL   Spec Grav, UA 1.025 1.010 - 1.025   Blood, UA negative negative   pH, UA 6.0 5.0 - 8.0   Protein Ur, POC trace (A) negative mg/dL   Urobilinogen, UA 0.2 0.2 or 1.0 E.U./dL   Nitrite, UA Negative Negative   Leukocytes, UA Negative Negative  POCT Microscopic Urinalysis ([facility])     Status: Abnormal   Collection Time: 08/15/16  5:42 PM  Result Value Ref Range   WBC,UR,HPF,POC None None WBC/hpf   RBC,UR,HPF,POC None None RBC/hpf   Bacteria Many (A) None, Too numerous to count   Mucus Absent Absent  Epithelial Cells, UR Per Microscopy Many (A) None, Too numerous to count cells/hpf  POCT Wet + KOH Prep     Status: Abnormal   Collection Time: 08/15/16  6:11 PM  Result Value Ref Range   Yeast by KOH Absent Absent   Yeast by wet prep Absent Absent   WBC by wet prep None (A) Few   Clue Cells Wet Prep HPF POC None None   Trich by wet prep Absent Absent   Bacteria Wet Prep HPF POC Many (A) Few   Epithelial Cells By [employer] Pref ([facility]) Few None, Few, Too numerous to count   RBC,UR,HPF,POC None None RBC/hpf    ASSESSMENT & PLAN  Gisel was seen today for vaginal pain and urinary  frequency.  Diagnoses and all orders for this visit:  Urinary frequency -     POCT urinalysis dipstick -     POCT Microscopic Urinalysis ([facility]) -     Urine Culture  Vaginal dryness -     POCT Wet + KOH Prep  Screen for STD (sexually transmitted disease) -     HIV antibody -     GC/Chlamydia Probe Amp -     RPR -     Hepatitis panel, acute    No clear etiology for patient's symptoms. POCT labs do not suggest UTI, yeast, or BV. There was some bacteria noted on wet prep. Await other lab results to discuss further tx plan. Instructed to avoid sexual activity while awaiting lab results. Follow up with PCP.   [name], PA-C  [facility] 08/15/2016 6:27 PM

## 2016-08-15 NOTE — Patient Instructions (Addendum)
Your point of care labs show some bacteria in your vaginal canal but it is not specific. Your other labs will return within the next week and we will contact you with these results and discuss further treatment plan. I recommend avoiding engaging in sexual intercourse until your other labs return. Return to clinic or PCP office if symptoms worsen. Make sure you complete a medical release form so we can fax your office notes and results to your PCP.     Safe Sex Practicing safe sex means taking steps before and during sex to reduce your risk of:  Getting an STD (sexually transmitted disease).  Giving your partner an STD.  Unwanted pregnancy.  How can I practice safe sex?  To practice safe sex:  Limit your sexual partners to only one partner who is having sex with only you.  Avoid using alcohol and recreational drugs before having sex. These substances can affect your judgment.  Before having sex with a new partner: ? Talk to your partner about past partners, past STDs, and drug use. ? You and your partner should be screened for STDs and discuss the results with each other.  Check your body regularly for sores, blisters, rashes, or unusual discharge. If you notice any of these problems, visit your health care provider.  If you have symptoms of an infection or you are being treated for an STD, avoid sexual contact.  While having sex, use a condom. Make sure to: ? Use a condom every time you have vaginal, oral, or anal sex. Both females and males should wear condoms during oral sex. ? Keep condoms in place from the beginning to the end of sexual activity. ? Use a latex condom, if possible. Latex condoms offer the best protection. ? Use only water-based lubricants or oils to lubricate a condom. Using petroleum-based lubricants or oils will weaken the condom and increase the chance that it will break.  See your health care provider for regular screenings, exams, and tests for  STDs.  Talk with your health care provider about the form of birth control (contraception) that is best for you.  Get vaccinated against hepatitis B and human papillomavirus (HPV).  If you are at risk of being infected with HIV (human immunodeficiency virus), talk with your health care provider about taking a prescription medicine to prevent HIV infection. You are considered at risk for HIV if: ? You are a man who has sex with other men. ? You are a heterosexual man or woman who is sexually active with more than one partner. ? You take drugs by injection. ? You are sexually active with a partner who has HIV.  This information is not intended to replace advice given to you by your health care provider. Make sure you discuss any questions you have with your health care provider. Document Released: 03/24/2004 Document Revised: 07/01/2015 Document Reviewed: 01/04/2015 Elsevier Interactive Patient Education  2018 [employer].     IF you received an x-ray today, you will receive an invoice from [facility]. Please contact [facility] at [phone] with questions or concerns regarding your invoice.   IF you received labwork today, you will receive an invoice from [facility]. Please contact [facility] at [phone] with questions or concerns regarding your invoice.   Our billing staff will not be able to assist you with questions regarding bills from these companies.  You will be contacted with the lab results as soon as they are available. The fastest way to get your  results is to activate your My Chart account. Instructions are located on the last page of this paperwork. If you have not heard from [place] regarding the results in 2 weeks, please contact this office.

## 2016-08-16 LAB — HEPATITIS PANEL, ACUTE
Hep A IgM: NEGATIVE
Hep B C IgM: NEGATIVE
Hep C Virus Ab: <0.1 s/co ratio (ref 0.0–0.9)
Hepatitis B Surface Ag: NEGATIVE

## 2016-08-16 LAB — GC/CHLAMYDIA PROBE AMP
Chlamydia trachomatis, NAA: NEGATIVE
Neisseria gonorrhoeae by PCR: NEGATIVE

## 2016-08-16 LAB — URINE CULTURE

## 2016-08-16 LAB — HIV ANTIBODY (ROUTINE TESTING W REFLEX): HIV Screen 4th Generation wRfx: NONREACTIVE

## 2016-08-16 LAB — RPR: RPR Ser Ql: NONREACTIVE

## 2016-08-18 ENCOUNTER — Telehealth: Payer: Self-pay | Admitting: Physician Assistant

## 2016-08-18 NOTE — Telephone Encounter (Addendum)
PATIENT STATES SHE SAW [facility] ON Monday FOR A FEW THINGS. BRITTANY DID A URINE CULTURE AND DID SOME BLOOD WORK ON HER. BRITTANY TOLD HER SHE WANTED TO WAIT UNTIL THE RESULTS COME BACK TO SEE WHAT THE NEXT STEP WOULD BE. SHE SAID SHE IS VERY ANXIOUS TO GET HER RESULTS. BEST PHONE [phone] (CELL) PHARMACY CHOICE IS [facility] ON [address]. [place]

## 2016-08-18 NOTE — Telephone Encounter (Signed)
[place] please release lab results.

## 2016-08-20 NOTE — Telephone Encounter (Signed)
I have reviewed her visit and her labs.  A common cause of sensation that a women has a UTI could come from vaginal dryness.  I would recommend that the patient use OTC azo to see if she might have cystitis which is inflammation of the bladder but she has not bacteria in her urine culture.  For her vaginal dryness I would recommend a lubricant to see if that will help with her symptoms.

## 2016-08-20 NOTE — Telephone Encounter (Signed)
Spoke w/ pt, she says she has tried these interventions without relief. States this has been ongoing since April but she has only seen primary care for this issue. I asked if she had seen a GYN about this problem and she has not. I explained that since all labs were negative a referral to GYN may be best. Patient is okay with this. Please advise.

## 2016-08-20 NOTE — Telephone Encounter (Signed)
Patient had abnormal UA / looks like screening labs are normal. She states pelvic pain continues and would like antibiotic if appropriate. Please review labs and urine culture. I did advise patient I will call her back today.

## 2016-08-22 ENCOUNTER — Other Ambulatory Visit: Payer: Self-pay | Admitting: Physician Assistant

## 2016-08-22 DIAGNOSIS — N898 Other specified noninflammatory disorders of vagina: Secondary | ICD-10-CM

## 2016-08-22 NOTE — Progress Notes (Signed)
Pt con't experience sensation to urinate despite negative lab work, use of OTC azo, and lubricants. Plan to refer to GYN for workup.

## 2016-08-22 NOTE — Telephone Encounter (Signed)
[place] - see below note from weekend call.

## 2016-08-24 NOTE — Progress Notes (Signed)
Please call pt and let her know she is negative for gonorrhea, chlamydia, HIV, syphilis and hepatitis. Her urine culture is negative - no UTI. Thank you!

## 2016-08-25 ENCOUNTER — Encounter: Payer: Self-pay | Admitting: Obstetrics and Gynecology

## 2016-08-25 ENCOUNTER — Ambulatory Visit: Payer: 59 | Admitting: Obstetrics and Gynecology

## 2016-08-25 VITALS — BP 126/72 | HR 80 | Resp 16 | Ht 68.0 in | Wt 198.0 lb

## 2016-08-25 DIAGNOSIS — N952 Postmenopausal atrophic vaginitis: Secondary | ICD-10-CM

## 2016-08-25 DIAGNOSIS — R87622 Low grade squamous intraepithelial lesion on cytologic smear of vagina (LGSIL): Secondary | ICD-10-CM

## 2016-08-25 DIAGNOSIS — R350 Frequency of micturition: Secondary | ICD-10-CM | POA: Diagnosis not present

## 2016-08-25 DIAGNOSIS — N760 Acute vaginitis: Secondary | ICD-10-CM

## 2016-08-25 LAB — POCT URINALYSIS DIPSTICK
Bilirubin, UA: NEGATIVE
Blood, UA: NEGATIVE
Glucose, UA: NEGATIVE
Ketones, UA: NEGATIVE
Leukocytes, UA: NEGATIVE
Nitrite, UA: NEGATIVE
Protein, UA: NEGATIVE
Urobilinogen, UA: 0.2 E.U./dL
pH, UA: 5 (ref 5.0–8.0)

## 2016-08-25 NOTE — Patient Instructions (Addendum)
Vaginitis Vaginitis is a condition in which the vaginal tissue swells and becomes red (inflamed). This condition is most often caused by a change in the normal balance of bacteria and yeast that live in the vagina. This change causes an overgrowth of certain bacteria or yeast, which causes the inflammation. There are different types of vaginitis, but the most common types are:  Bacterial vaginosis.  Yeast infection (candidiasis).  Trichomoniasis vaginitis. This is a sexually transmitted disease (STD).  Viral vaginitis.  Atrophic vaginitis.  Allergic vaginitis.  What are the causes? The cause of this condition depends on the type of vaginitis. It can be caused by:  Bacteria (bacterial vaginosis).  Yeast, which is a fungus (yeast infection).  A parasite (trichomoniasis vaginitis).  A virus (viral vaginitis).  Low hormone levels (atrophic vaginitis). Low hormone levels can occur during pregnancy, breastfeeding, or after menopause.  Irritants, such as bubble baths, scented tampons, and feminine sprays (allergic vaginitis).  Other factors can change the normal balance of the yeast and bacteria that live in the vagina. These include:  Antibiotic medicines.  Poor hygiene.  Diaphragms, vaginal sponges, spermicides, birth control pills, and intrauterine devices (IUD).  Sex.  Infection.  Uncontrolled diabetes.  A weakened defense (immune) system.  What increases the risk? This condition is more likely to develop in women who:  Smoke.  Use vaginal douches, scented tampons, or scented sanitary pads.  Wear tight-fitting pants.  Wear thong underwear.  Use oral birth control pills or an IUD.  Have sex without a condom.  Have multiple sex partners.  Have an STD.  Frequently use the spermicide nonoxynol-9.  Eat lots of foods high in sugar.  Have uncontrolled diabetes.  Have low estrogen levels.  Have a weakened immune system from an immune disorder or medical  treatment.  Are pregnant or breastfeeding.  What are the signs or symptoms? Symptoms vary depending on the cause of the vaginitis. Common symptoms include:  Abnormal vaginal discharge. ? The discharge is white, gray, or yellow with bacterial vaginosis. ? The discharge is thick, white, and cheesy with a yeast infection. ? The discharge is frothy and yellow or greenish with trichomoniasis.  A bad vaginal smell. The smell is fishy with bacterial vaginosis.  Vaginal itching, pain, or swelling.  Sex that is painful.  Pain or burning when urinating.  Sometimes there are no symptoms. How is this diagnosed? This condition is diagnosed based on your symptoms and medical history. A physical exam, including a pelvic exam, will also be done. You may also have other tests, including:  Tests to determine the pH level (acidity or alkalinity) of your vagina.  A whiff test, to assess the odor that results when a sample of your vaginal discharge is mixed with a potassium hydroxide solution.  Tests of vaginal fluid. A sample will be examined under a microscope.  How is this treated? Treatment varies depending on the type of vaginitis you have. Your treatment may include:  Antibiotic creams or pills to treat bacterial vaginosis and trichomoniasis.  Antifungal medicines, such as vaginal creams or suppositories, to treat a yeast infection.  Medicine to ease discomfort if you have viral vaginitis. Your sexual partner should also be treated.  Estrogen delivered in a cream, pill, suppository, or vaginal ring to treat atrophic vaginitis. If vaginal dryness occurs, lubricants and moisturizing creams may help. You may need to avoid scented soaps, sprays, or douches.  Stopping use of a product that is causing allergic vaginitis. Then using a vaginal  cream to treat the symptoms.  Follow these instructions at home: Lifestyle  Keep your genital area clean and dry. Avoid soap, and only rinse the area  with water.  Do not douche or use tampons until your health care provider says it is okay to do so. Use sanitary pads, if needed.  Do not have sex until your health care provider approves. When you can return to sex, practice safe sex and use condoms.  Wipe from front to back. This avoids the spread of bacteria from the rectum to the vagina. General instructions  Take over-the-counter and prescription medicines only as told by your health care provider.  If you were prescribed an antibiotic medicine, take or use it as told by your health care provider. Do not stop taking or using the antibiotic even if you start to feel better.  Keep all follow-up visits as told by your health care provider. This is important. How is this prevented?  Use mild, non-scented products. Do not use things that can irritate the vagina, such as fabric softeners. Avoid the following products if they are scented: ? Feminine sprays. ? Detergents. ? Tampons. ? Feminine hygiene products. ? Soaps or bubble baths.  Let air reach your genital area. ? Wear cotton underwear to reduce moisture buildup. ? Avoid wearing underwear while you sleep. ? Avoid wearing tight pants and underwear or nylons without a cotton panel. ? Avoid wearing thong underwear.  Take off any wet clothing, such as bathing suits, as soon as possible.  Practice safe sex and use condoms. Contact a health care provider if:  You have abdominal pain.  You have a fever.  You have symptoms that last for more than 2-3 days. Get help right away if:  You have a fever and your symptoms suddenly get worse. Summary  Vaginitis is a condition in which the vaginal tissue becomes inflamed.This condition is most often caused by a change in the normal balance of bacteria and yeast that live in the vagina.  Treatment varies depending on the type of vaginitis you have.  Do not douche, use tampons , or have sex until your health care provider approves.  When you can return to sex, practice safe sex and use condoms. This information is not intended to replace advice given to you by your health care provider. Make sure you discuss any questions you have with your health care provider. Document Released: 12/12/2006 Document Revised: 03/22/2016 Document Reviewed: 03/22/2016 Elsevier Interactive Patient Education  2018 [employer].  Pap Test Why am I having this test? A pap test is sometimes called a pap smear. It is a screening test that is used to check for signs of cancer of the vagina, cervix, and uterus. The test can also identify the presence of infection or precancerous changes. Your health care provider will likely recommend you have this test done on a regular basis. This test may be done:  Every 3 years, starting at age 64.  Every 5 years, in combination with testing for the presence of human papillomavirus (HPV).  More or less often depending on other medical conditions.  What kind of sample is taken? Using a small cotton swab, plastic spatula, or brush, your health care provider will collect a sample of cells from the surface of your cervix. Your cervix is the opening to your uterus, also called a womb. Secretions from the cervix and vagina may also be collected. How do I prepare for this test?  Be aware of where  you are in your menstrual cycle. You may be asked to reschedule the test if you are menstruating on the day of the test.  You may need to reschedule if you have a known vaginal infection on the day of the test.  You may be asked to avoid douching or taking a bath the day before or the day of the test.  Some medicines can cause abnormal test results, such as digitalis and tetracycline. Talk with your health care provider before your test if you take one of these medicines. What do the results mean? Abnormal test results may indicate a number of health conditions. These may include:  Cancer. Although pap test results  cannot be used to diagnose cancer of the cervix, vagina, or uterus, they may suggest the possibility of cancer. Further tests would be required to determine if cancer is present.  Sexually transmitted disease.  Fungal infection.  Parasite infection.  Herpes infection.  A condition causing or contributing to infertility.  It is your responsibility to obtain your test results. Ask the lab or department performing the test when and how you will get your results. Contact your health care provider to discuss any questions you have about your results. Talk with your health care provider to discuss your results, treatment options, and if necessary, the need for more tests. Talk with your health care provider if you have any questions about your results. This information is not intended to replace advice given to you by your health care provider. Make sure you discuss any questions you have with your health care provider. Document Released: 05/07/2002 Document Revised: 10/21/2015 Document Reviewed: 07/08/2013 Elsevier Interactive Patient Education  [employer].
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
## 2016-08-26 ENCOUNTER — Other Ambulatory Visit: Payer: Self-pay | Admitting: Internal Medicine

## 2016-08-26 ENCOUNTER — Telehealth: Payer: Self-pay | Admitting: *Deleted

## 2016-08-26 DIAGNOSIS — Z1231 Encounter for screening mammogram for malignant neoplasm of breast: Secondary | ICD-10-CM

## 2016-08-26 LAB — VAGINITIS/VAGINOSIS, DNA PROBE
Candida Species: NEGATIVE
Gardnerella vaginalis: NEGATIVE
Trichomonas vaginosis: NEGATIVE

## 2016-08-26 NOTE — Telephone Encounter (Signed)
Spoke with patient, advised as seen below per Dr. Quincy Simmonds. Patient scheduled for MMG on 08/29/16. Contraception is postmenopausal, scheduled for colpo on 09/09/16 at 3pm with Dr. Quincy Simmonds. Advised to take Motrin 800 mg with food and water one hour before procedure. Patient verbalizes understanding and is agreeable.   Routing to provider for final review. Patient is agreeable to disposition. Will close encounter.   Cc: Theresia Lo, Lerry Liner

## 2016-08-26 NOTE — Telephone Encounter (Signed)
-----   Message from Nunzio Cobbs, MD sent at 08/26/2016 12:55 PM EDT ----- Please report negative vaginitis testing to patient.  I suspect that her symptoms are really from atrophy.  We talked about potential estrogen therapy, but she will need to update her mammogram first.   I will also be seeing her for a colposcopy for LGSIL of the vagina.

## 2016-08-26 NOTE — Telephone Encounter (Signed)
Patient returning call.

## 2016-08-26 NOTE — Telephone Encounter (Signed)
Left message to call Chellie Vanlue at [phone].  

## 2016-08-29 ENCOUNTER — Ambulatory Visit
Admission: RE | Admit: 2016-08-29 | Discharge: 2016-08-29 | Disposition: A | Discharge status: Home / Self Care | Payer: Commercial Managed Care - HMO | Source: Ambulatory Visit | Attending: Internal Medicine | Admitting: Internal Medicine

## 2016-08-29 DIAGNOSIS — Z1231 Encounter for screening mammogram for malignant neoplasm of breast: Secondary | ICD-10-CM | POA: Diagnosis not present

## 2016-09-02 ENCOUNTER — Encounter: Payer: Self-pay | Admitting: Obstetrics and Gynecology

## 2016-09-09 ENCOUNTER — Encounter: Payer: Self-pay | Admitting: Obstetrics and Gynecology

## 2016-09-09 ENCOUNTER — Ambulatory Visit (INDEPENDENT_AMBULATORY_CARE_PROVIDER_SITE_OTHER): Payer: 59 | Admitting: Obstetrics and Gynecology

## 2016-09-09 VITALS — BP 136/78 | HR 72 | Resp 16 | Wt 198.0 lb

## 2016-09-09 DIAGNOSIS — R87622 Low grade squamous intraepithelial lesion on cytologic smear of vagina (LGSIL): Secondary | ICD-10-CM

## 2016-09-09 DIAGNOSIS — N895 Stricture and atresia of vagina: Secondary | ICD-10-CM

## 2016-09-09 DIAGNOSIS — Z780 Asymptomatic menopausal state: Secondary | ICD-10-CM | POA: Diagnosis not present

## 2016-09-09 DIAGNOSIS — N952 Postmenopausal atrophic vaginitis: Secondary | ICD-10-CM | POA: Diagnosis not present

## 2016-09-09 NOTE — Progress Notes (Signed)
Subjective:     Patient ID: Madison Hopkins, female   DOB: 1955-08-11, 61 y.o.   MRN: [id number]  HPI Pap History: 07/28/16 LGSIL   Needs a bone density. States she is having difficulty ordering this through her PCP.  Chart reviewed electronically.  No BMD noted.   Review of Systems LMP: Years ago Contraception: Total Hysterectomy/BSO/appy     Objective:   Physical Exam  Genitourinary:     Colposcopy of the vagina. Consent for procedure.  3% acetic acid placed.  Cervix absent.  Two  2 3- mm bands of vaginal mucosa at the apex. (States a hx of dysparuenia.) No acetowhite lesions. Lugol's placed and decreased uptake of the left vaginal apex.  Biopsy taken and sent to path. Bands of vaginal apical adhesions cut with scissors.  Monsel's placed.  Minimal EBL.  No complications.      Assessment:     Vaginal bands.  Lysed. Hx pap showing LGSIL of vagina. Menopause.  Desire for BMD.    Plan:       Follow up biopsy result.  Colposcopy instructions and precautions given.  Discussion of dyspareunia and vaginal adhesion bands. BMD ordered.  After visit summary to patient.  ___15____ minutes face to face time of which over 50% was spent in counseling.

## 2016-09-09 NOTE — Patient Instructions (Signed)

## 2016-09-15 ENCOUNTER — Encounter: Payer: Self-pay | Admitting: *Deleted

## 2016-10-21 ENCOUNTER — Other Ambulatory Visit: Payer: Self-pay | Admitting: Obstetrics and Gynecology

## 2016-10-21 DIAGNOSIS — E2839 Other primary ovarian failure: Secondary | ICD-10-CM

## 2016-10-24 ENCOUNTER — Inpatient Hospital Stay: Admission: RE | Admit: 2016-10-24 | Payer: Commercial Managed Care - HMO | Source: Ambulatory Visit

## 2016-10-24 ENCOUNTER — Ambulatory Visit
Admission: RE | Admit: 2016-10-24 | Discharge: 2016-10-24 | Disposition: A | Discharge status: Home / Self Care | Payer: 59 | Source: Ambulatory Visit | Attending: Obstetrics and Gynecology | Admitting: Obstetrics and Gynecology

## 2016-10-24 DIAGNOSIS — Z13820 Encounter for screening for osteoporosis: Secondary | ICD-10-CM | POA: Diagnosis not present

## 2016-10-24 DIAGNOSIS — E2839 Other primary ovarian failure: Secondary | ICD-10-CM

## 2016-10-24 DIAGNOSIS — Z780 Asymptomatic menopausal state: Secondary | ICD-10-CM | POA: Diagnosis not present

## 2016-12-19 DIAGNOSIS — M5412 Radiculopathy, cervical region: Secondary | ICD-10-CM | POA: Diagnosis not present

## 2016-12-26 ENCOUNTER — Ambulatory Visit (INDEPENDENT_AMBULATORY_CARE_PROVIDER_SITE_OTHER): Payer: 59 | Admitting: Physician Assistant

## 2016-12-26 ENCOUNTER — Encounter: Payer: Self-pay | Admitting: Physician Assistant

## 2016-12-26 VITALS — BP 142/78 | HR 75 | Temp 98.6°F | Resp 18 | Ht 69.88 in | Wt 199.4 lb

## 2016-12-26 DIAGNOSIS — J309 Allergic rhinitis, unspecified: Secondary | ICD-10-CM | POA: Diagnosis not present

## 2016-12-26 DIAGNOSIS — J0100 Acute maxillary sinusitis, unspecified: Secondary | ICD-10-CM

## 2016-12-26 MED ORDER — DOXYCYCLINE HYCLATE 100 MG PO CAPS: 100.0000 mg | ORAL_CAPSULE | Freq: Two times a day (BID) | ORAL | 0 refills | Status: AC

## 2016-12-26 NOTE — Progress Notes (Signed)
MRN: [id number] DOB: 08-14-1955  Subjective:   Madison Hopkins is a 61 y.o. female presenting for chief complaint of Sinus Problem ( X 4 days) .  Reports 5 day history of worsening right sided sinus congestion, sinus pain, ear fullness, sore throat, fatigue and subjective fever. Has tried xyzal, flonase, theraflu, honey and lemon, and warm mist humidifier consistently with no relief. Denies wheezing, shortness of breath, chest pain, myalgia, night sweats, nausea, vomiting, abdominal pain, diarrhea, and confusion. Has had sick contact with coworkers. Has history of yearly seasonal allergies, no history of asthma. Takes allergy meds daily. Patient hashad flu shot this season. Denies smoking. Denies any other aggravating or relieving factors, no other questions or concerns.  Madison Hopkins has a current medication list which includes the following prescription(s): acyclovir, acyclovir ointment, and boric acid in isopropyl. Also is allergic to penicillins.  Madison Hopkins  has a past medical history of Abnormal Pap smear of vagina (07/28/2016); Allergy; Elevated hemoglobin A1c (07/28/2016); Endometriosis; Low vitamin D level (07/28/2016); and STD (sexually transmitted disease). Also  has a past surgical history that includes Colon surgery; Cesarean section; Abdominal hysterectomy; Small intestine surgery; Spine surgery; and Oophorectomy.   Objective:   Vitals: BP (!) 144/80 (BP Location: Left Arm, Patient Position: Sitting, Cuff Size: Large)   Pulse 75   Temp 98.6 F (37 C) (Oral)   Resp 18   Ht 5' 9.88" (1.775 m)   Wt 199 lb 6.4 oz (90.4 kg)   SpO2 99%   BMI 28.71 kg/m   Physical Exam  Constitutional: She is oriented to person, place, and time. She appears well-developed and well-nourished. No distress.  HENT:  Head: Normocephalic and atraumatic.  Right Ear: Tympanic membrane is not erythematous and not bulging. A middle ear effusion is present.  Left Ear: Tympanic membrane is not erythematous and not  bulging. A middle ear effusion is present.  Nose: Mucosal edema (severe on right side, moderate on left side) present. No rhinorrhea. Right sinus exhibits maxillary sinus tenderness (exquisite tenderness with percussion and when pt tilts head forward). Right sinus exhibits no frontal sinus tenderness. Left sinus exhibits no maxillary sinus tenderness and no frontal sinus tenderness.  Mouth/Throat: Uvula is midline and mucous membranes are normal. Posterior oropharyngeal erythema present. No tonsillar exudate.  Eyes: Pupils are equal, round, and reactive to light. Conjunctivae and EOM are normal.  Neck: Normal range of motion.  Cardiovascular: Normal rate, regular rhythm and normal heart sounds.   Pulmonary/Chest: Effort normal and breath sounds normal. She has no wheezes. She has no rhonchi. She has no rales.  Lymphadenopathy:       Head (right side): Submandibular adenopathy present. No submental, no tonsillar, no preauricular, no posterior auricular and no occipital adenopathy present.       Head (left side): No submental, no submandibular, no tonsillar, no preauricular, no posterior auricular and no occipital adenopathy present.    She has no cervical adenopathy.       Right: No supraclavicular adenopathy present.       Left: No supraclavicular adenopathy present.  Neurological: She is alert and oriented to person, place, and time. No cranial nerve deficit.  Skin: Skin is warm and dry.  Psychiatric: She has a normal mood and affect.  Vitals reviewed.   No results found for this or any previous visit (from the past 24 hour(s)).  Assessment and Plan :  1. Acute non-recurrent maxillary sinusitis Due to duration of symptoms with extensive history of allergies and  no improvement despite consistent symptomatic treatment, will treat for bacterial etiology at this time.Return to clinic if symptoms worsen, do not improve, or as needed - doxycycline (VIBRAMYCIN) 100 MG capsule; Take 1 capsule (100  mg total) by mouth 2 (two) times daily.  Dispense: 14 capsule; Refill: 0  2. Allergic rhinitis, unspecified seasonality, unspecified trigger Due to history of allergies, will refer to allergy specialist as patient would like to consider allergy shots at this time if that is an option for her. - Ambulatory referral to [place], PA-C  [facility] 12/26/2016 7:24 PM

## 2016-12-26 NOTE — Patient Instructions (Addendum)
Due to your penicillin allergy, we are going to treat you with doxycycline for your sinus infection.  Please take as prescribed.  While taking Doxycycline:  -Do not drink milk or take iron supplements, multivitamins, calcium supplements, antacids, laxatives within 2 hours before or after taking doxycycline. -Avoid direct exposure to sunlight or tanning beds. Doxycycline can make you sunburn more easily. Wear protective clothing and use sunscreen (SPF 30 or higher) when you are outdoors. -Antibiotic medicines can cause diarrhea, which may be a sign of a new infection. If you have diarrhea that is watery or bloody, stop taking this medicine and seek medical care.   I have also placed a referral for allergy specialist.  They should contact you within 1-2 weeks to schedule an appointment.  I recommend continuing symptomatic treatment like you are. Eat light meals and continue to rest. Return to clinic if symptoms worsen, do not improve, or as needed  Sinusitis, Adult Sinusitis is soreness and inflammation of your sinuses. Sinuses are hollow spaces in the bones around your face. They are located:  Around your eyes.  In the middle of your forehead.  Behind your nose.  In your cheekbones.  Your sinuses and nasal passages are lined with a stringy fluid (mucus). Mucus normally drains out of your sinuses. When your nasal tissues get inflamed or swollen, the mucus can get trapped or blocked so air cannot flow through your sinuses. This lets bacteria, viruses, and funguses grow, and that leads to infection. Follow these instructions at home: Medicines  Take, use, or apply over-the-counter and prescription medicines only as told by your doctor. These may include nasal sprays.  If you were prescribed an antibiotic medicine, take it as told by your doctor. Do not stop taking the antibiotic even if you start to feel better. Hydrate and Humidify  Drink enough water to keep your pee (urine) clear or  pale yellow.  Use a cool mist humidifier to keep the humidity level in your home above 50%.  Breathe in steam for 10-15 minutes, 3-4 times a day or as told by your doctor. You can do this in the bathroom while a hot shower is running.  Try not to spend time in cool or dry air. Rest  Rest as much as possible.  Sleep with your head raised (elevated).  Make sure to get enough sleep each night. General instructions  Put a warm, moist washcloth on your face 3-4 times a day or as told by your doctor. This will help with discomfort.  Wash your hands often with soap and water. If there is no soap and water, use hand sanitizer.  Do not smoke. Avoid being around people who are smoking (secondhand smoke).  Keep all follow-up visits as told by your doctor. This is important. Contact a doctor if:  You have a fever.  Your symptoms get worse.  Your symptoms do not get better within 10 days. Get help right away if:  You have a very bad headache.  You cannot stop throwing up (vomiting).  You have pain or swelling around your face or eyes.  You have trouble seeing.  You feel confused.  Your neck is stiff.  You have trouble breathing. This information is not intended to replace advice given to you by your health care provider. Make sure you discuss any questions you have with your health care provider. Document Released: 08/03/2007 Document Revised: 10/11/2015 Document Reviewed: 12/10/2014 Elsevier Interactive Patient Education  2018 [employer].   IF  you received an x-ray today, you will receive an invoice from [facility]. Please contact [facility] at [phone] with questions or concerns regarding your invoice.   IF you received labwork today, you will receive an invoice from [place]. Please contact [facility] at [phone] with questions or concerns regarding your invoice.   Our billing staff will not be able to assist you with questions regarding bills  from these companies.  You will be contacted with the lab results as soon as they are available. The fastest way to get your results is to activate your My Chart account. Instructions are located on the last page of this paperwork. If you have not heard from [place] regarding the results in 2 weeks, please contact this office.

## 2016-12-26 NOTE — Progress Notes (Deleted)
   Madison Hopkins  MRN: [id number] DOB: 04/21/1955  Subjective:  Madison Hopkins is a 62 y.o. female seen in office today for a chief complaint of ***  Review of Systems  There are no active problems to display for this patient.   Current Outpatient Prescriptions on File Prior to Visit  Medication Sig Dispense Refill  . acyclovir (ZOVIRAX) 400 MG tablet TAKE 1 TABLET BY ORAL ROUTE EVERY 12 HOURS  3  . acyclovir ointment (ZOVIRAX) 5 % as needed.  3  . Boric Acid in Isopropyl SOLN Place in ear(s).     No current facility-administered medications on file prior to visit.     Allergies  Allergen Reactions  . Penicillins      Objective:  BP (!) 144/80 (BP Location: Left Arm, Patient Position: Sitting, Cuff Size: Large)   Pulse 75   Temp 98.6 F (37 C) (Oral)   Resp 18   Ht 5' 9.88" (1.775 m)   Wt 199 lb 6.4 oz (90.4 kg)   SpO2 99%   BMI 28.71 kg/m   Physical Exam  Assessment and Plan :  *** There are no diagnoses linked to this encounter.   [name] PA-C  [facility] 12/26/2016 5:21 PM

## 2016-12-27 ENCOUNTER — Telehealth: Payer: Self-pay | Admitting: Family Medicine

## 2016-12-27 NOTE — Telephone Encounter (Signed)
Pt was given Doxycycline yesterday and forgot to ask for the two pills for a yeast infection

## 2016-12-28 NOTE — Telephone Encounter (Signed)
Please advise 

## 2017-01-05 ENCOUNTER — Other Ambulatory Visit: Payer: Self-pay | Admitting: Urgent Care

## 2017-01-05 MED ORDER — FLUCONAZOLE 150 MG PO TABS: 150.0000 mg | ORAL_TABLET | ORAL | 0 refills | Status: DC

## 2017-01-05 NOTE — Progress Notes (Signed)
Patient will take diflucan for 2 doses, one week apart, to cover for yeast vaginitis associated with antibiotic use.

## 2017-01-05 NOTE — Telephone Encounter (Signed)
Copied from [employer]. Topic: Quick Communication - See Telephone Encounter >> Jan 05, 2017  9:20 AM [place], Laban Emperor wrote: CRM for notification. See Telephone encounter for:   Please see note below, pt called on Oct.30 but nothing has been called in for her or pt has not been contacted.  Open    12/27/2016 [facility], [place]  (Newest Message First)  December 28, 2016  Rodolph Bong, [place]   3:15 PM  Note    Please advise.  December 27, 2016    3:58 PM  Celedonio Savage L routed this conversation to Pcp Clinical Message Pool  Yvette Rack   3:57 PM  Note    Pt was given Doxycycline yesterday and forgot to ask for the two pills for a yeast infection  3:57 PM   Lyla Glassing contacted Yvette Rack   01/05/17.

## 2017-01-11 ENCOUNTER — Other Ambulatory Visit: Payer: Self-pay | Admitting: Physical Medicine and Rehabilitation

## 2017-01-11 DIAGNOSIS — M542 Cervicalgia: Principal | ICD-10-CM

## 2017-01-11 DIAGNOSIS — G8929 Other chronic pain: Secondary | ICD-10-CM

## 2017-01-21 ENCOUNTER — Ambulatory Visit
Admission: RE | Admit: 2017-01-21 | Discharge: 2017-01-21 | Disposition: A | Discharge status: Home / Self Care | Payer: 59 | Source: Ambulatory Visit | Attending: Physical Medicine and Rehabilitation | Admitting: Physical Medicine and Rehabilitation

## 2017-01-21 DIAGNOSIS — G8929 Other chronic pain: Secondary | ICD-10-CM

## 2017-01-21 DIAGNOSIS — M542 Cervicalgia: Principal | ICD-10-CM

## 2017-01-21 DIAGNOSIS — M50223 Other cervical disc displacement at C6-C7 level: Secondary | ICD-10-CM | POA: Diagnosis not present

## 2017-01-24 ENCOUNTER — Other Ambulatory Visit: Payer: Self-pay

## 2017-01-24 ENCOUNTER — Encounter: Payer: Self-pay | Admitting: Physician Assistant

## 2017-01-24 ENCOUNTER — Ambulatory Visit: Payer: 59 | Admitting: Physician Assistant

## 2017-01-24 VITALS — BP 136/72 | HR 83 | Temp 98.8°F | Resp 18 | Ht 69.88 in | Wt 200.4 lb

## 2017-01-24 DIAGNOSIS — R21 Rash and other nonspecific skin eruption: Secondary | ICD-10-CM

## 2017-01-24 DIAGNOSIS — J029 Acute pharyngitis, unspecified: Secondary | ICD-10-CM | POA: Diagnosis not present

## 2017-01-24 DIAGNOSIS — J069 Acute upper respiratory infection, unspecified: Secondary | ICD-10-CM

## 2017-01-24 DIAGNOSIS — R0981 Nasal congestion: Secondary | ICD-10-CM | POA: Diagnosis not present

## 2017-01-24 DIAGNOSIS — R059 Cough, unspecified: Secondary | ICD-10-CM

## 2017-01-24 DIAGNOSIS — M5412 Radiculopathy, cervical region: Secondary | ICD-10-CM | POA: Diagnosis not present

## 2017-01-24 DIAGNOSIS — R05 Cough: Secondary | ICD-10-CM

## 2017-01-24 MED ORDER — BENZOCAINE-MENTHOL 15-3.6 MG MT LOZG: 1.0000 | LOZENGE | Freq: Four times a day (QID) | OROMUCOSAL | 0 refills | Status: DC | PRN

## 2017-01-24 MED ORDER — FLUTICASONE PROPIONATE 50 MCG/ACT NA SUSP: NASAL | 5 refills | Status: DC

## 2017-01-24 MED ORDER — BENZONATATE 100 MG PO CAPS: 100.0000 mg | ORAL_CAPSULE | Freq: Three times a day (TID) | ORAL | 0 refills | Status: DC | PRN

## 2017-01-24 NOTE — Patient Instructions (Addendum)
- We will treat this as a respiratory viral infection.  - I recommend you continue to rest, drink plenty of fluids, eat light meals including soups.  - You may use cough syrup at night for your cough and sore throat, Tessalon pearls during the day. Be aware that cough syrup can definitely make you drowsy and sleepy so do not drive or operate any heavy machinery if it is affecting you during the day.  - You may also use cepacol,Tylenol, or ibuprofen over-the-counter for your sore throat.  - Use flonase for nasal congestion. - Please let me know if you are not seeing any improvement or get worse in 5-7 days.  For rash, I would continue the calamine lotion if it is helping.  You may also try hydrocortisone cream.  Please let us know if your rash worsens or does not improve with that treatment.  Thank you for letting me participate in your health and well being.  Upper Respiratory Infection, Adult Most upper respiratory infections (URIs) are caused by a virus. A URI affects the nose, throat, and upper air passages. The most common type of URI is often called "the common cold." Follow these instructions at home:  Take medicines only as told by your doctor.  Gargle warm saltwater or take cough drops to comfort your throat as told by your doctor.  Use a warm mist humidifier or inhale steam from a shower to increase air moisture. This may make it easier to breathe.  Drink enough fluid to keep your pee (urine) clear or pale yellow.  Eat soups and other clear broths.  Have a healthy diet.  Rest as needed.  Go back to work when your fever is gone or your doctor says it is okay. ? You may need to stay home longer to avoid giving your URI to others. ? You can also wear a face mask and wash your hands often to prevent spread of the virus.  Use your inhaler more if you have asthma.  Do not use any tobacco products, including cigarettes, chewing tobacco, or electronic cigarettes. If you need help  quitting, ask your doctor. Contact a doctor if:  You are getting worse, not better.  Your symptoms are not helped by medicine.  You have chills.  You are getting more short of breath.  You have brown or red mucus.  You have yellow or brown discharge from your nose.  You have pain in your face, especially when you bend forward.  You have a fever.  You have puffy (swollen) neck glands.  You have pain while swallowing.  You have white areas in the back of your throat. Get help right away if:  You have very bad or constant: ? Headache. ? Ear pain. ? Pain in your forehead, behind your eyes, and over your cheekbones (sinus pain). ? Chest pain.  You have long-lasting (chronic) lung disease and any of the following: ? Wheezing. ? Long-lasting cough. ? Coughing up blood. ? A change in your usual mucus.  You have a stiff neck.  You have changes in your: ? Vision. ? Hearing. ? Thinking. ? Mood. This information is not intended to replace advice given to you by your health care provider. Make sure you discuss any questions you have with your health care provider. Document Released: 08/03/2007 Document Revised: 10/18/2015 Document Reviewed: 05/22/2013 Elsevier Interactive Patient Education  2018 [employer].     IF you received an x-ray today, you will receive an invoice from  [facility]. Please contact [facility] Radiology at [phone] with questions or concerns regarding your invoice.   IF you received labwork today, you will receive an invoice from [place]. Please contact [facility] at [phone] with questions or concerns regarding your invoice.   Our billing staff will not be able to assist you with questions regarding bills from these companies.  You will be contacted with the lab results as soon as they are available. The fastest way to get your results is to activate your My Chart account. Instructions are located on the last page of this paperwork.  If you have not heard from [place] regarding the results in 2 weeks, please contact this office.

## 2017-01-24 NOTE — Progress Notes (Signed)
MRN: [id number] DOB: [birth year]  Subjective:   Madison Hopkins is a 61 y.o. female presenting for chief complaint of Cough (chest congestion x5days has taken some meds but isnt working robitussin and hydrocodone ); Laryngitis; and Rash (rash on chest x3days isnt sure if its from what she is taking ) .  Reports 6 day history of sinus congestion, rhinorrhea, itchy watery eyes, sore throat and dry cough (mild sputum production). Has tried Hycodan, Robitussin, and oral antihistamines with moderate relief. She is out of her flonase. Notes that she is feeling much better since when it first started. Denies fever, sinus pain, ear pain, wheezing, shortness of breath, chest pain and myalgia, night sweats, chills, nausea, vomiting, abdominal pain and diarrhea. Has had sick contact with anyone. Has history of seasonal allergies, no history of asthma. Patient has had flu shot this season. Denies smoking.   Also has a rash on her chest.  This appeared about 3 days ago.  It was itching but that has improved.  She has tried calamine lotion and this is helped.  She does not recall any new exposures to lotions, soaps, laundry detergents, jewelry, plants, animals, food, or medication.    Genever has a current medication list which includes the following prescription(s): acyclovir, acyclovir ointment, boric acid in isopropyl, fluconazole, fluticasone, and levocetirizine. Also is allergic to penicillins.  Madison Hopkins  has a past medical history of Abnormal Pap smear of vagina (07/28/2016), Allergy, Elevated hemoglobin A1c (07/28/2016), Endometriosis, Low vitamin D level (07/28/2016), and STD (sexually transmitted disease). Also  has a past surgical history that includes Colon surgery; Cesarean section; Abdominal hysterectomy; Small intestine surgery; Spine surgery; and Oophorectomy.   Objective:   Vitals: BP 136/72   Pulse 83   Temp 98.8 F (37.1 C) (Oral)   Resp 18   Ht 5' 9.88" (1.775 m)   Wt 200 lb 6.4 oz (90.9  kg)   SpO2 96%   BMI 28.85 kg/m   Physical Exam  Constitutional: She is oriented to person, place, and time. She appears well-developed and well-nourished. No distress.  HENT:  Head: Normocephalic and atraumatic.  Right Ear: Tympanic membrane, external ear and ear canal normal.  Left Ear: Tympanic membrane, external ear and ear canal normal.  Nose: Mucosal edema ( Moderate bilaterally ) and rhinorrhea present. Right sinus exhibits no maxillary sinus tenderness and no frontal sinus tenderness. Left sinus exhibits no maxillary sinus tenderness and no frontal sinus tenderness.  Mouth/Throat: Uvula is midline and mucous membranes are normal. Posterior oropharyngeal erythema present. Tonsils are 1+ on the right. Tonsils are 1+ on the left. No tonsillar exudate.  Eyes: Right conjunctiva is injected (mild). Left conjunctiva is injected (mild).  Neck: Normal range of motion.  Cardiovascular: Normal rate, regular rhythm and normal heart sounds.  Pulmonary/Chest: Effort normal and breath sounds normal. No respiratory distress. She has no wheezes. She has no rhonchi. She has no rales.  Lymphadenopathy:       Head (right side): No submental, no submandibular, no tonsillar, no preauricular, no posterior auricular and no occipital adenopathy present.       Head (left side): No submental, no submandibular, no tonsillar, no preauricular, no posterior auricular and no occipital adenopathy present.    She has no cervical adenopathy.       Right: No supraclavicular adenopathy present.       Left: No supraclavicular adenopathy present.  Neurological: She is alert and oriented to person, place, and time.  Skin: Skin is  warm and dry. Rash (Small region of anterior chest with erythematous papules ) noted.  Psychiatric: She has a normal mood and affect.  Vitals reviewed.   No results found for this or any previous visit (from the past 24 hour(s)).  Assessment and Plan :  1. Sinus congestion 2. Nasal  congestion - fluticasone (FLONASE) 50 MCG/ACT nasal spray; INHALE 1 SPRAY INTO THE NOSTRIL(S) EVERY DAY IN EACH NOSTRIL  Dispense: 16 g; Refill: 5 3. Sore throat - Benzocaine-Menthol (CEPACOL SORE THROAT) 15-3.6 MG LOZG; Use as directed 1 lozenge in the mouth or throat 4 (four) times daily as needed.  Dispense: 18 each; Refill: 0 4. Cough - benzonatate (TESSALON) 100 MG capsule; Take 1-2 capsules (100-200 mg total) by mouth 3 (three) times daily as needed for cough.  Dispense: 40 capsule; Refill: 0 5. Acute upper respiratory infection Patient is well-appearing, no distress.  Vitals are stable.  She is afebrile.  Symptoms are improving. This is consistent with viral etiology.  Will treat symptomatically at this time.  Encouraged to return to clinic if symptoms worsen, do not improve, or as needed.  6. Rash and nonspecific skin eruption Appearance consistent with a contact dermatitis.  Rash has improved since it started.  Recommended over-the-counter hydrocortisone cream.  Can continue calamine lotion if it is helping. Return to clinic if symptoms worsen, do not improve, or as needed.  [name], PA-C  [facility] 01/24/2017 7:24 PM

## 2017-01-27 ENCOUNTER — Ambulatory Visit: Payer: Self-pay | Admitting: *Deleted

## 2017-01-27 NOTE — Telephone Encounter (Addendum)
Pt seen by [name] on 01/24/17 but has no relief in symptoms; she has been using mucinex, cough syrup, nasal washes, and tea as previously instructed; pt triaged per nurse protocol and requests to be seen tomorrow 01/28/17; pt offered and accepted an appt 01/28/17 at 1040 with Whitney McVey; pt also states that [place] said that perhaps something can be called in for her and this is the pt's preference; she uses [place], [place]; please contact the pt at [phone] to let her know what the plan of action is; will route to [place] pool high priority; spoke with Jenny Reichmann at [facility] pool at [phone] and he will route this to provider and will call the pt with instructions.  Reason for Disposition . [1] Continuous (nonstop) coughing interferes with work or school AND [2] no improvement using cough treatment per Care Advice  Answer Assessment - Initial Assessment Questions 1. ONSET: "When did the cough begin?"      3 weeks ago 2. SEVERITY: "How bad is the cough today?"      severe 3. RESPIRATORY DISTRESS: "Describe your breathing."      no 4. FEVER: "Do you have a fever?" If so, ask: "What is your temperature, how was it measured, and when did it start?"     no 5. SPUTUM: "Describe the color of your sputum" (clear, white, yellow, green)     Green, yellow 6. HEMOPTYSIS: "Are you coughing up any blood?" If so ask: "How much?" (flecks, streaks, tablespoons, etc.)     no 7. CARDIAC HISTORY: "Do you have any history of heart disease?" (e.g., heart attack, congestive heart failure)      no 8. LUNG HISTORY: "Do you have any history of lung disease?"  (e.g., pulmonary embolus, asthma, emphysema)     no 9. PE RISK FACTORS: "Do you have a history of blood clots?" (or: recent major surgery, recent prolonged travel, bedridden )     no 10. OTHER SYMPTOMS: "Do you have any other symptoms?" (e.g., runny nose, wheezing, chest pain)       Sinus pain and pressure; runny nose 11. PREGNANCY:  "Is there any chance you are pregnant?" "When was your last menstrual period?"       No  12. TRAVEL: "Have you traveled out of the country in the last month?" (e.g., travel history, exposures)       no  Protocols used: [place]

## 2017-01-28 ENCOUNTER — Other Ambulatory Visit: Payer: Self-pay

## 2017-01-28 ENCOUNTER — Encounter: Payer: Self-pay | Admitting: Physician Assistant

## 2017-01-28 ENCOUNTER — Ambulatory Visit: Payer: 59 | Admitting: Physician Assistant

## 2017-01-28 VITALS — BP 118/72 | HR 82 | Temp 98.1°F | Ht 69.0 in | Wt 195.0 lb

## 2017-01-28 DIAGNOSIS — R059 Cough, unspecified: Secondary | ICD-10-CM

## 2017-01-28 DIAGNOSIS — R05 Cough: Secondary | ICD-10-CM

## 2017-01-28 DIAGNOSIS — J209 Acute bronchitis, unspecified: Secondary | ICD-10-CM | POA: Diagnosis not present

## 2017-01-28 MED ORDER — HYDROCODONE-HOMATROPINE 5-1.5 MG/5ML PO SYRP: 5.0000 mL | ORAL_SOLUTION | Freq: Three times a day (TID) | ORAL | 0 refills | Status: DC | PRN

## 2017-01-28 MED ORDER — AZITHROMYCIN 250 MG PO TABS: ORAL_TABLET | ORAL | 0 refills | Status: DC

## 2017-01-28 MED ORDER — PREDNISONE 20 MG PO TABS: 40.0000 mg | ORAL_TABLET | Freq: Every day | ORAL | 0 refills | Status: AC

## 2017-01-28 NOTE — Patient Instructions (Signed)
Stay well hydrated. Get lost of rest.   Advil or ibuprofen for pain. Do not take Aspirin.  Throat lozenges (if you are not at risk for choking) or sprays may be used to soothe your throat. Drink enough water and fluids to keep your urine clear or pale yellow. For sore throat: ? Gargle with 8 oz of salt water ( tsp of salt per 1 qt of water) as often as every 1-2 hours to soothe your throat.  Gargle liquid benadryl.  Use Elderberry syrup.   For sore throat try using a honey-based tea. Use 3 teaspoons of honey with juice squeezed from half lemon. Place shaved pieces of ginger into 1/2-1 cup of water and warm over stove top. Then mix the ingredients and repeat every 4 hours as needed.  Cough Syrup Recipe: Sweet Lemon & Honey Thyme  Ingredients a handful of fresh thyme sprigs   1 pint of water (2 cups)  1/2 cup honey (raw is best, but regular will do)  1/2 lemon chopped Instructions 1. Place the lemon in the pint jar and cover with the honey. The honey will macerate the lemons and draw out liquids which taste so delicious! 2. Meanwhile, toss the thyme leaves into a saucepan and cover them with the water. 3. Bring the water to a gentle simmer and reduce it to half, about a cup of tea. 4. When the tea is reduced and cooled a bit, strain the sprigs & leaves, add it into the pint jar and stir it well. 5. Give it a shake and use a spoonful as needed. 6. Store your homemade cough syrup in the refrigerator for about a month.  What causes a cough? In adults, common causes of a cough include: ?An infection of the airways or lungs (such as the common cold) ?Postnasal drip - Postnasal drip is when mucus from the nose drips down or flows along the back of the throat. Postnasal drip can happen when people have: .A cold .Allergies .A sinus infection - The sinuses are hollow areas in the bones of the face that open into the nose. ?Lung conditions, like asthma and chronic obstructive pulmonary disease  (COPD) - Both of these conditions can make it hard to breathe. COPD is usually caused by smoking. ?Acid reflux - Acid reflux is when the acid that is normally in your stomach backs up into your esophagus (the tube that carries food from your mouth to your stomach). ?A side effect from blood pressure medicines called "ACE inhibitors" ?Smoking cigarettes  Is there anything I can do on my own to get rid of my cough? Yes. To help get rid of your cough, you can: ?Use a humidifier in your bedroom ?Use an over-the-counter cough medicine, or suck on cough drops or hard candy ?Stop smoking, if you smoke ?If you have allergies, avoid the things you are allergic to (like pollen, dust, animals, or mold) If you have acid reflux, your doctor or nurse will tell you which lifestyle changes can help reduce symptoms.

## 2017-01-28 NOTE — Progress Notes (Signed)
Madison Hopkins  MRN: [id number] DOB: 07-Jun-1955  PCP: Glendale Chard, MD  Subjective:  Pt is a pleasant 61 year old female who presents to clinic for f/u congestion x 10 days. She was here 11/27 for the cough and congestions and treated supportively with tessalon pearls, flonase and cepacol. She has also been taking Robitussin, mucinex, eating broth, drinking ginger tea.  She is no better and her cough is getting worse, is keeping her up at night. She endorses fatigue. Denies fever, chills, night sweats, palpitations, n/v.   Review of Systems  Constitutional: Positive for activity change, appetite change and fatigue. Negative for chills, diaphoresis and fever.  HENT: Positive for congestion, postnasal drip, rhinorrhea and sinus pressure. Negative for sinus pain and sneezing.   Respiratory: Positive for cough and chest tightness. Negative for shortness of breath and wheezing.   Cardiovascular: Negative for chest pain and palpitations.  Gastrointestinal: Negative for abdominal pain, nausea and vomiting.  Neurological: Positive for headaches. Negative for dizziness and light-headedness.  Psychiatric/Behavioral: Positive for sleep disturbance.    There are no active problems to display for this patient.   Current Outpatient Medications on File Prior to Visit  Medication Sig Dispense Refill  . acyclovir (ZOVIRAX) 400 MG tablet TAKE 1 TABLET BY ORAL ROUTE EVERY 12 HOURS  3  . acyclovir ointment (ZOVIRAX) 5 % as needed.  3  . Boric Acid in Isopropyl SOLN Place in ear(s).    . fluticasone (FLONASE) 50 MCG/ACT nasal spray INHALE 1 SPRAY INTO THE NOSTRIL(S) EVERY DAY IN EACH NOSTRIL 16 g 5  . levocetirizine (XYZAL) 5 MG tablet every evening.  1  . Benzocaine-Menthol (CEPACOL SORE THROAT) 15-3.6 MG LOZG Use as directed 1 lozenge in the mouth or throat 4 (four) times daily as needed. (Patient not taking: Reported on 01/28/2017) 18 each 0  . benzonatate (TESSALON) 100 MG capsule Take 1-2  capsules (100-200 mg total) by mouth 3 (three) times daily as needed for cough. (Patient not taking: Reported on 01/28/2017) 40 capsule 0   No current facility-administered medications on file prior to visit.     Allergies  Allergen Reactions  . Penicillins      Objective:  BP 118/72 (BP Location: Left Arm, Patient Position: Sitting, Cuff Size: Large)   Pulse 82   Temp 98.1 F (36.7 C) (Oral)   Ht 5\' 9"  (1.753 m)   Wt 195 lb (88.5 kg)   SpO2 98%   BMI 28.80 kg/m   Physical Exam  Constitutional: She is oriented to person, place, and time and well-developed, well-nourished, and in no distress. No distress.  HENT:  Right Ear: External ear normal.  Left Ear: External ear normal.  Nose: Nose normal.  Mouth/Throat: Oropharynx is clear and moist. No oropharyngeal exudate.  Eyes: Conjunctivae are normal.  Neck: Neck supple.  Cardiovascular: Normal rate, regular rhythm and normal heart sounds.  Pulmonary/Chest: Effort normal and breath sounds normal. She has no wheezes. She has no rales.  Lymphadenopathy:    She has no cervical adenopathy.  Neurological: She is alert and oriented to person, place, and time. GCS score is 15.  Skin: Skin is warm and dry.  Psychiatric: Mood, memory, affect and judgment normal.  tearful  Vitals reviewed.   Assessment and Plan :  1. Acute bronchitis, unspecified organism - azithromycin (ZITHROMAX) 250 MG tablet; Take 2 tabs PO x 1 dose, then 1 tab PO QD x 4 days  Dispense: 6 tablet; Refill: 0 - predniSONE (DELTASONE) 20  MG tablet; Take 2 tablets (40 mg total) by mouth daily with breakfast for 5 days.  Dispense: 10 tablet; Refill: 0  2. Cough - HYDROcodone-homatropine (HYCODAN) 5-1.5 MG/5ML syrup; Take 5 mLs by mouth every 8 (eight) hours as needed for cough.  Dispense: 120 mL; Refill: 0 - Pt presents for worsening cough x >10 days, she was treated previously with supportive care. Plan to cover. RTC in 5-7 days if no improvement.   Mercer Pod,  PA-C  [facility] 01/28/2017 10:15 AM

## 2017-02-09 DIAGNOSIS — M5412 Radiculopathy, cervical region: Secondary | ICD-10-CM | POA: Diagnosis not present

## 2017-02-14 DIAGNOSIS — M25511 Pain in right shoulder: Secondary | ICD-10-CM | POA: Diagnosis not present

## 2017-02-14 DIAGNOSIS — M542 Cervicalgia: Secondary | ICD-10-CM | POA: Diagnosis not present

## 2017-02-14 DIAGNOSIS — M5412 Radiculopathy, cervical region: Secondary | ICD-10-CM | POA: Diagnosis not present

## 2017-03-09 ENCOUNTER — Ambulatory Visit: Payer: 59 | Admitting: Allergy

## 2017-04-17 DIAGNOSIS — H40003 Preglaucoma, unspecified, bilateral: Secondary | ICD-10-CM | POA: Diagnosis not present

## 2017-06-02 DIAGNOSIS — H40003 Preglaucoma, unspecified, bilateral: Secondary | ICD-10-CM | POA: Diagnosis not present

## 2017-06-09 ENCOUNTER — Ambulatory Visit: Payer: 59 | Admitting: Podiatry

## 2017-06-19 ENCOUNTER — Ambulatory Visit: Payer: 59 | Admitting: Podiatry

## 2017-06-19 ENCOUNTER — Ambulatory Visit (INDEPENDENT_AMBULATORY_CARE_PROVIDER_SITE_OTHER): Payer: 59

## 2017-06-19 ENCOUNTER — Encounter: Payer: Self-pay | Admitting: Podiatry

## 2017-06-19 DIAGNOSIS — Z01812 Encounter for preprocedural laboratory examination: Secondary | ICD-10-CM | POA: Diagnosis not present

## 2017-06-19 DIAGNOSIS — M779 Enthesopathy, unspecified: Secondary | ICD-10-CM | POA: Diagnosis not present

## 2017-06-19 DIAGNOSIS — D492 Neoplasm of unspecified behavior of bone, soft tissue, and skin: Secondary | ICD-10-CM

## 2017-06-19 DIAGNOSIS — M722 Plantar fascial fibromatosis: Secondary | ICD-10-CM

## 2017-06-20 NOTE — Progress Notes (Signed)
Subjective:   Patient ID: Madison Hopkins, female   DOB: 62 y.o.   MRN: [id number]   HPI Patient presents with an approximate 4 cm mass in the plantar aspect of the left arch that she states is been present for a couple of years and seems to be growing in size.  Patient does not remember injury and does not smoke and likes to be active   Review of Systems  All other systems reviewed and are negative.       Objective:  Physical Exam  Constitutional: She appears well-developed and well-nourished.  Cardiovascular: Intact distal pulses.  Pulmonary/Chest: Effort normal.  Musculoskeletal: Normal range of motion.  Neurological: She is alert.  Skin: Skin is warm.  Nursing note and vitals reviewed.   Neurovascular status was found to be intact muscle strength was adequate with an approximate 4.5 cm x 4 cm mass plantar aspect left arch that spongy but does not appear to be completely contained within subcutaneous tissue.     Assessment:  Possibility for lipoma versus fibroma or other unknown mass with growth occurring recent     Plan:  H&P x-ray removed and is precautionary measure I am sending for MRI.  I discussed the consideration for removal and I explained this procedure and that it may be necessary at one point in future but will wait for the results of the MRI  X-ray did not indicate calcification or other pathology from that standpoint

## 2017-07-11 ENCOUNTER — Other Ambulatory Visit: Payer: 59

## 2017-07-17 ENCOUNTER — Ambulatory Visit: Payer: 59 | Admitting: Podiatry

## 2017-07-31 ENCOUNTER — Other Ambulatory Visit: Payer: Self-pay

## 2017-07-31 ENCOUNTER — Other Ambulatory Visit (HOSPITAL_COMMUNITY)
Admission: RE | Admit: 2017-07-31 | Discharge: 2017-07-31 | Disposition: A | Discharge status: Home / Self Care | Payer: 59 | Source: Ambulatory Visit | Attending: Obstetrics and Gynecology | Admitting: Obstetrics and Gynecology

## 2017-07-31 ENCOUNTER — Ambulatory Visit: Payer: 59 | Admitting: Obstetrics and Gynecology

## 2017-07-31 ENCOUNTER — Encounter: Payer: Self-pay | Admitting: Obstetrics and Gynecology

## 2017-07-31 VITALS — BP 132/76 | HR 68 | Resp 16 | Ht 67.75 in | Wt 201.0 lb

## 2017-07-31 DIAGNOSIS — R2232 Localized swelling, mass and lump, left upper limb: Secondary | ICD-10-CM

## 2017-07-31 DIAGNOSIS — N760 Acute vaginitis: Secondary | ICD-10-CM | POA: Insufficient documentation

## 2017-07-31 DIAGNOSIS — Z01419 Encounter for gynecological examination (general) (routine) without abnormal findings: Secondary | ICD-10-CM | POA: Diagnosis not present

## 2017-07-31 NOTE — Progress Notes (Signed)
62 y.o. G17P0002 [name] here for annual exam.    Reporting urinary frequency.  Drink a lot of water. This is not a change for her.   Having vaginal dryness. Using baby bar soap and feeling better.  Using a vinegar and water spray douche.   Not sexually active.   Reports left axillary lumps for a couple of months.  No breast lumps.   Rash on her right leg. Seems to be moving.  Using hydrocortisone cream.  Feels like it is improving.   PCP: Dr. Timmothy Euler    No LMP recorded (lmp unknown). Patient has had a hysterectomy.           Sexually active: No.  The current method of family planning is status post hysterectomy.    Exercising: Yes.    walking Smoker:  no  Health Maintenance: Pap: 09/09/16 colpo biopsy showing atrophic squamous cells (menopausal change); 07/28/16 LGSIL History of abnormal Pap:  yes MMG:  08/29/16 BIRADS 1 negative/density b Colonoscopy: years ago per patient BMD:   08/29/16  Result  Normal TDaP:  Not up to date Gardasil:   n/a HIV and Hep C: 08/15/16 Negative Screening Labs:  Discuss today   reports that she has never smoked. She has never used smokeless tobacco. She reports that she drinks alcohol. She reports that she has current or past drug history. Drug: Marijuana.  Past Medical History:  Diagnosis Date  . Abnormal Pap smear of vagina 07/28/2016   LGSIL; colpo 07/2016 atrophic squamous cells  . Allergy   . Elevated hemoglobin A1c 07/28/2016   level - 6.1  . Endometriosis   . Low vitamin D level 07/28/2016   level 24.7  . STD (sexually transmitted disease)     Past Surgical History:  Procedure Laterality Date  . ABDOMINAL HYSTERECTOMY    . CESAREAN SECTION    . COLON SURGERY    . OOPHORECTOMY    . SMALL INTESTINE SURGERY    . SPINE SURGERY      Current Outpatient Medications  Medication Sig Dispense Refill  . Ferrous Gluconate-C-Folic Acid (IRON-C PO) Take by mouth.    . fluticasone (FLONASE) 50 MCG/ACT nasal spray INHALE  1 SPRAY INTO THE NOSTRIL(S) EVERY DAY IN EACH NOSTRIL 16 g 5  . glucosamine-chondroitin 500-400 MG tablet Take 1 tablet by mouth 3 (three) times daily.    Marland Kitchen levocetirizine (XYZAL) 5 MG tablet every evening.  1   No current facility-administered medications for this visit.     Family History  Problem Relation Age of Onset  . Mental illness Mother   . Alcoholism Mother   . Cirrhosis Mother   . Cancer Father     Review of Systems  Constitutional:       Left breast lump  HENT: Negative.   Eyes: Negative.   Respiratory: Negative.   Cardiovascular: Negative.   Gastrointestinal: Negative.   Endocrine: Negative.   Genitourinary: Negative.   Musculoskeletal:       Muscle or joint pain  Skin: Positive for rash.  Allergic/Immunologic: Negative.   Neurological: Negative.   Hematological: Negative.   Psychiatric/Behavioral: Negative.     Exam:   BP 132/76 (BP Location: Right Arm, Patient Position: Sitting, Cuff Size: Normal)   Pulse 68   Resp 16   Ht 5' 7.75" (1.721 m)   Wt 201 lb (91.2 kg)   LMP  (LMP Unknown)   BMI 30.79 kg/m     General appearance: alert, cooperative and appears stated  age Head: Normocephalic, without obvious abnormality, atraumatic Neck: no adenopathy, supple, symmetrical, trachea midline and thyroid normal to inspection and palpation Lungs: clear to auscultation bilaterally Breasts: right - normal appearance, no masses or tenderness, No nipple retraction or dimpling, No nipple discharge or bleeding, No axillary or supraclavicular adenopathy Left - normal appearance, no masses or tenderness, No nipple retraction or dimpling, No nipple discharge or bleeding,  Axillary mass 3 cm - firm and tender.  Rib cage?  Mass? Heart: regular rate and rhythm Abdomen: soft, non-tender; no masses, no organomegaly Extremities: extremities normal, atraumatic, no cyanosis or edema Skin: Skin color, texture, turgor normal. No rashes or lesions Lymph nodes: Cervical,  supraclavicular, and axillary nodes normal. No abnormal inguinal nodes palpated Neurologic: Grossly normal  Pelvic: External genitalia:  no lesions              Urethra:  normal appearing urethra with no masses, tenderness or lesions              Bartholins and Skenes: normal                 Vagina: normal appearing vagina with normal color and discharge, no lesions              Cervix:  Absent.              Pap taken: Yes.   Bimanual Exam:  Uterus:   absent              Adnexa: no mass, fullness, tenderness              Rectal exam: Yes.  .  Confirms.              Anus:  normal sphincter tone, no lesions  Chaperone was present for exam.  Assessment:   Well woman visit with normal exam. Status post TAH/BSO/appy.  VAIN I pap last year and atrophy on colpo bx. Left axillary mass.   Plan: Mammogram dx and left breast/axillary [place].  Recommended self breast awareness. Pap and HR HPV as above. Vaginitis testing from the pap.  Guidelines for Calcium, Vitamin D, regular exercise program including cardiovascular and weight bearing exercise. Labs and vaccines with PCP.  She will pursue her colonoscopy once she determines where she had it done last time.   Follow up annually and prn.  After visit summary provided.

## 2017-07-31 NOTE — Patient Instructions (Signed)

## 2017-08-02 LAB — CYTOLOGY - PAP
Bacterial vaginitis: NEGATIVE
Candida vaginitis: NEGATIVE
Diagnosis: UNDETERMINED — AB
HPV: NOT DETECTED
Trichomonas: NEGATIVE

## 2017-08-04 ENCOUNTER — Other Ambulatory Visit: Payer: Self-pay | Admitting: Obstetrics and Gynecology

## 2017-08-04 ENCOUNTER — Ambulatory Visit
Admission: RE | Admit: 2017-08-04 | Discharge: 2017-08-04 | Disposition: A | Discharge status: Home / Self Care | Payer: 59 | Source: Ambulatory Visit | Attending: Obstetrics and Gynecology | Admitting: Obstetrics and Gynecology

## 2017-08-04 ENCOUNTER — Telehealth: Payer: Self-pay

## 2017-08-04 DIAGNOSIS — R2232 Localized swelling, mass and lump, left upper limb: Secondary | ICD-10-CM

## 2017-08-04 DIAGNOSIS — R87810 Cervical high risk human papillomavirus (HPV) DNA test positive: Principal | ICD-10-CM

## 2017-08-04 DIAGNOSIS — N6321 Unspecified lump in the left breast, upper outer quadrant: Secondary | ICD-10-CM | POA: Diagnosis not present

## 2017-08-04 DIAGNOSIS — R928 Other abnormal and inconclusive findings on diagnostic imaging of breast: Secondary | ICD-10-CM | POA: Diagnosis not present

## 2017-08-04 DIAGNOSIS — N6320 Unspecified lump in the left breast, unspecified quadrant: Secondary | ICD-10-CM

## 2017-08-04 DIAGNOSIS — R87610 Atypical squamous cells of undetermined significance on cytologic smear of cervix (ASC-US): Secondary | ICD-10-CM

## 2017-08-04 NOTE — Telephone Encounter (Signed)
Spoke with patient. Advised of results as seen below from [place]. Patient verbalizes understanding. Requesting an appointment on 08/17/2017 as she is off of work. Advised will review with Dr.Silva and return call regarding scheduling.

## 2017-08-04 NOTE — Telephone Encounter (Signed)
-----   Message from Nunzio Cobbs, MD sent at 08/03/2017  5:54 AM EDT ----- Please contact patient with results.  Her pap shows ASCUS and her HR HPV is negative.  By ASCCP guidelines, she needs a colposcopy again this year.  She had a LGSIL pap last year and had colposcopy then showing atrophy.   Her vaginitis testing is negative.

## 2017-08-06 NOTE — Telephone Encounter (Signed)
[facility] for colposcopy on Thursday, June 20, am at 8:30, check in at 8:15.   Cc - Lamont Snowball

## 2017-08-07 ENCOUNTER — Other Ambulatory Visit: Payer: Self-pay | Admitting: Obstetrics and Gynecology

## 2017-08-07 ENCOUNTER — Ambulatory Visit
Admission: RE | Admit: 2017-08-07 | Discharge: 2017-08-07 | Disposition: A | Discharge status: Home / Self Care | Payer: 59 | Source: Ambulatory Visit | Attending: Obstetrics and Gynecology | Admitting: Obstetrics and Gynecology

## 2017-08-07 DIAGNOSIS — C779 Secondary and unspecified malignant neoplasm of lymph node, unspecified: Secondary | ICD-10-CM | POA: Diagnosis not present

## 2017-08-07 DIAGNOSIS — N6320 Unspecified lump in the left breast, unspecified quadrant: Secondary | ICD-10-CM

## 2017-08-07 DIAGNOSIS — N6321 Unspecified lump in the left breast, upper outer quadrant: Secondary | ICD-10-CM | POA: Diagnosis not present

## 2017-08-07 DIAGNOSIS — R590 Localized enlarged lymph nodes: Secondary | ICD-10-CM | POA: Diagnosis not present

## 2017-08-07 DIAGNOSIS — C50412 Malignant neoplasm of upper-outer quadrant of left female breast: Secondary | ICD-10-CM | POA: Diagnosis not present

## 2017-08-07 DIAGNOSIS — R2232 Localized swelling, mass and lump, left upper limb: Secondary | ICD-10-CM

## 2017-08-07 NOTE — Telephone Encounter (Signed)
Call to patient. Appointment scheduled for 08-17-17 at 0830. Instructed to take motrin 800 mg one hour prior with food.  Encounter closed.

## 2017-08-09 ENCOUNTER — Telehealth: Payer: Self-pay | Admitting: Oncology

## 2017-08-09 HISTORY — PX: BREAST BIOPSY: SHX20

## 2017-08-09 NOTE — Telephone Encounter (Signed)
Spoke to patient to confirm morning [facility] appointment on 6/19, packet mailed to patient

## 2017-08-10 ENCOUNTER — Encounter: Payer: Self-pay | Admitting: *Deleted

## 2017-08-10 ENCOUNTER — Other Ambulatory Visit: Payer: Self-pay | Admitting: *Deleted

## 2017-08-10 ENCOUNTER — Telehealth: Payer: Self-pay | Admitting: Obstetrics and Gynecology

## 2017-08-10 DIAGNOSIS — Z170 Estrogen receptor positive status [ER+]: Principal | ICD-10-CM

## 2017-08-10 DIAGNOSIS — C50412 Malignant neoplasm of upper-outer quadrant of left female breast: Secondary | ICD-10-CM

## 2017-08-10 NOTE — Telephone Encounter (Signed)
Has an appointment next week in 6 days at the [facility] for her new diagnosis of metastatic breast cancer.   Co-workers and family are very supportive.   She will keep her appointment for colposcopy in one week.

## 2017-08-15 NOTE — Progress Notes (Signed)
[place]  [phone] [phone]     ID: TONNI MANSOUR DOB: Jul 19, 1955  MR#: [id number]  AJO#:[id number]  Patient Care Team: Donzetta Kohut as PCP - General (Physician Assistant) [name], MD as Consulting Physician (General Surgery) [name], [name] Dad, MD as Consulting Physician (Oncology) Kyung Rudd, MD as Consulting Physician (Radiation Oncology) Nunzio Cobbs, MD as Consulting Physician (Obstetrics and Gynecology) Gentry Fitz, MD as Consulting Physician (Family Medicine) OTHER MD:   CHIEF COMPLAINT: HER-2 positive breast cancer  CURRENT TREATMENT: Neoadjuvant chemotherapy   HISTORY OF CURRENT ILLNESS: MEGHAN WARSHAWSKY noted a mass in the left axilla sometime in January or February 2019.  She eventually brought her to her gynecologist's attention, and underwent bilateral diagnostic mammography with tomography and left breast ultrasonography at [facility] on 08/04/2017 showing: breast density category B. There is a highly suspicious hypoechoic mass in the left breast at the 2 o'clock upper outer quadrant measuring 2.3 x 1.6 x 2.2 cm, located 2 cm from the nipple. Sonographically, there were 2 enlarged lymph nodes in the left axilla, the largest with cortical thickening measuring 2.5 cm. No evidence of malignancy was seen in the right breast.   Accordingly on 08/07/2017 she proceeded to biopsy of the left breast area and 1 of the lymph nodes in question. The pathology from this procedure showed ([id number]): Invasive ductal carcinoma, grade 3. Metastatic carcinoma was found in one left axillary lymph node. Prognostic indicators significant for: estrogen receptor, 30% positive with weak staining intensity and progesterone receptor, 0% negative. Proliferation marker Ki67 at 80%. HER2 amplified with ratios HER2/CEP17 signals 2.32 and average HER2 copies per cell 6.60  The patient's subsequent history is as  detailed below.  INTERVAL HISTORY: Zyanne was evaluated in the multidisciplinary breast cancer clinic on 08/16/2017 accompanied by her brother, son, daughter-in-law, and 2 friends. Her case was also presented at the multidisciplinary breast cancer conference on the same day. At that time a preliminary plan was proposed:   REVIEW OF SYSTEMS: The patient denies unusual headaches, visual changes, nausea, vomiting, stiff neck, dizziness, or gait imbalance. There has been no cough, phlegm production, or pleurisy, no chest pain or pressure, and no change in bowel or bladder habits. The patient denies fever, rash, bleeding, unexplained fatigue or unexplained weight loss. A detailed review of systems was otherwise entirely negative.   PAST MEDICAL HISTORY: Past Medical History:  Diagnosis Date  . Abnormal Pap smear of vagina 07/28/2016   LGSIL; colpo 07/2016 atrophic squamous cells  . Allergy   . Elevated hemoglobin A1c 07/28/2016   level - 6.1  . Endometriosis   . Low vitamin D level 07/28/2016   level 24.7  . STD (sexually transmitted disease)   GERD but no ulcers  PAST SURGICAL HISTORY: Past Surgical History:  Procedure Laterality Date  . ABDOMINAL HYSTERECTOMY    . CESAREAN SECTION    . COLON SURGERY    . OOPHORECTOMY    . SMALL INTESTINE SURGERY    . SPINE SURGERY    Hysterectomy with salpingo-oophorectomy, Tonsillectomy, Plantar Fascitis Right Foot Surgery    FAMILY HISTORY Family History  Problem Relation Age of Onset  . Mental illness Mother   . Alcoholism Mother   . Cirrhosis Mother   . Cancer Father   . Lung cancer Father   The patient' father died at age 33 due to lung cancer (heavy smoker). The patient's mother died due to liver cirrhosis (heavy drinker).  The patient has 2 brothers and no sisters. There was a paternal 1st cousin with colon cancer diagnosed in the mid 40's, who also had cervical cancer. The mother of this 1st cousin (the patient's paternal aunt) had  cancer (the patient's is unsure of what type). There was also a paternal uncle with prostate cancer diagnosed in the 1's. The patient denies a family history of breast or ovarian cancer.     GYNECOLOGIC HISTORY:  No LMP recorded (lmp unknown). Patient has had a hysterectomy. Menarche: 62 years old Age at first live birth: 62 years old She is GXP2.  The patient is status post total hysterectomy with bilateral salpingo-oophorectomy in 1995.  She never used contraception. She notes that she had an estrogen shot one time but no other HRTs.     SOCIAL HISTORY:  The patient works in Therapist, art for the tax department. She is single. At home is herself and no pets. Her son, [name] is age 74 and lives in [place], [place] as a Musician. The patient's daughter [name] is age 13 and lives in [place] in Therapist, art for [employer]. The patient has 5 grandchildren and 1 great grandchild. She attends [employer].      ADVANCED DIRECTIVES: Not in place; at the 08/16/2017 visit the patient was given the appropriate forms to complete on notarized at her discretion   HEALTH MAINTENANCE: Social History   Tobacco Use  . Smoking status: Never Smoker  . Smokeless tobacco: Never Used  Substance Use Topics  . Alcohol use: Yes    Alcohol/week: 0.0 oz    Comment: occasional  . Drug use: Yes    Types: Marijuana     Colonoscopy: 2009?  PAP: 07/31/2017/ positive for Atypical Squamous cells of uncertain significance.   Bone density: 10/24/2016 showed a T score of -0.7 (normal was (   Allergies  Allergen Reactions  . Penicillins     Current Outpatient Medications  Medication Sig Dispense Refill  . cetirizine (ZYRTEC) 10 MG tablet Take 10 mg by mouth daily.    . naproxen sodium (ALEVE) 220 MG tablet Take 220 mg by mouth.     No current facility-administered medications for this visit.     OBJECTIVE: Middle-aged African-American woman who appears well  Vitals:   08/16/17 0815    BP: (!) 150/69  Pulse: 63  Resp: 18  Temp: 98.5 F (36.9 C)  SpO2: 99%     Body mass index is 30.54 kg/m.   Wt Readings from Last 3 Encounters:  08/16/17 199 lb 6.4 oz (90.4 kg)  07/31/17 201 lb (91.2 kg)  01/28/17 195 lb (88.5 kg)      ECOG FS:0 - Asymptomatic  Ocular: Sclerae unicteric, pupils round and equal Ear-nose-throat: Oropharynx clear and moist Lymphatic: No cervical or supraclavicular adenopathy Lungs no rales or rhonchi Heart regular rate and rhythm Abd soft, nontender, positive bowel sounds MSK no focal spinal tenderness, no joint edema Neuro: non-focal, well-oriented, appropriate affect Breasts: The right breast is unremarkable.  The left breast is status post recent biopsy.  There is a moderate ecchymosis.  There is a palpable mass, not well-defined, and the lateral aspect of the left breast.  There is a well-defined 2 cm lymph node in the left axilla   LAB RESULTS:  CMP     Component Value Date/Time   NA 141 08/16/2017 0804   K 3.9 08/16/2017 0804   CL 107 08/16/2017 0804   CO2 26 08/16/2017 0804  GLUCOSE 109 08/16/2017 0804   BUN 11 08/16/2017 0804   CREATININE 0.97 08/16/2017 0804   CALCIUM 9.9 08/16/2017 0804   PROT 8.3 08/16/2017 0804   ALBUMIN 4.2 08/16/2017 0804   AST 19 08/16/2017 0804   ALT 20 08/16/2017 0804   ALKPHOS 118 08/16/2017 0804   BILITOT 0.6 08/16/2017 0804   GFRNONAA >60 08/16/2017 0804   GFRAA >60 08/16/2017 0804    No results found for: TOTALPROTELP, ALBUMINELP, A1GS, A2GS, BETS, BETA2SER, GAMS, MSPIKE, SPEI  No results found for: KPAFRELGTCHN, LAMBDASER, [facility]  Lab Results  Component Value Date   WBC 7.7 08/16/2017   NEUTROABS 4.2 08/16/2017   HGB 12.3 08/16/2017   HCT 37.8 08/16/2017   MCV 80.0 08/16/2017   PLT 343 08/16/2017    '@LASTCHEMISTRY'$ @  No results found for: LABCA2  No components found for: EHMCNO709  No results for input(s): INR in the last 168 hours.  No results found for: LABCA2  No  results found for: GGE366  No results found for: QHU765  No results found for: YYT035  No results found for: CA2729  No components found for: HGQUANT  No results found for: CEA1 / No results found for: CEA1   No results found for: AFPTUMOR  No results found for: CHROMOGRNA  No results found for: PSA1  Appointment on 08/16/2017  Component Date Value Ref Range Status  . WBC Count 08/16/2017 7.7  3.9 - 10.3 K/uL Final  . RBC 08/16/2017 4.72  3.70 - 5.45 MIL/uL Final  . Hemoglobin 08/16/2017 12.3  11.6 - 15.9 g/dL Final  . HCT 08/16/2017 37.8  34.8 - 46.6 % Final  . MCV 08/16/2017 80.0  79.5 - 101.0 fL Final  . MCH 08/16/2017 26.0  25.1 - 34.0 pg Final  . MCHC 08/16/2017 32.5  31.5 - 36.0 g/dL Final  . RDW 08/16/2017 14.2  11.2 - 14.5 % Final  . Platelet Count 08/16/2017 343  145 - 400 K/uL Final  . Neutrophils Relative % 08/16/2017 55  % Final  . Neutro Abs 08/16/2017 4.2  1.5 - 6.5 K/uL Final  . Lymphocytes Relative 08/16/2017 33  % Final  . Lymphs Abs 08/16/2017 2.6  0.9 - 3.3 K/uL Final  . Monocytes Relative 08/16/2017 7  % Final  . Monocytes Absolute 08/16/2017 0.5  0.1 - 0.9 K/uL Final  . Eosinophils Relative 08/16/2017 4  % Final  . Eosinophils Absolute 08/16/2017 0.3  0.0 - 0.5 K/uL Final  . Basophils Relative 08/16/2017 1  % Final  . Basophils Absolute 08/16/2017 0.1  0.0 - 0.1 K/uL Final   Performed at [facility], [place] [address], [place], [place] [address]  . Sodium 08/16/2017 141  136 - 145 mmol/L Final  . Potassium 08/16/2017 3.9  3.5 - 5.1 mmol/L Final  . Chloride 08/16/2017 107  98 - 109 mmol/L Final  . CO2 08/16/2017 26  22 - 29 mmol/L Final  . Glucose, Bld 08/16/2017 109  70 - 140 mg/dL Final  . BUN 08/16/2017 11  7 - 26 mg/dL Final  . Creatinine 08/16/2017 0.97  0.60 - 1.10 mg/dL Final  . Calcium 08/16/2017 9.9  8.4 - 10.4 mg/dL Final  . Total Protein 08/16/2017 8.3  6.4 - 8.3 g/dL Final  . Albumin 08/16/2017 4.2  3.5 - 5.0 g/dL  Final  . AST 08/16/2017 19  5 - 34 U/L Final  . ALT 08/16/2017 20  0 - 55 U/L Final  . Alkaline Phosphatase 08/16/2017  118  40 - 150 U/L Final  . Total Bilirubin 08/16/2017 0.6  0.2 - 1.2 mg/dL Final  . GFR, Est Non Af Am 08/16/2017 >60  >60 mL/min Final  . GFR, Est AFR Am 08/16/2017 >60  >60 mL/min Final   Comment: (NOTE) The eGFR has been calculated using the CKD EPI equation. This calculation has not been validated in all clinical situations. eGFR's persistently <60 mL/min signify possible Chronic Kidney Disease.   Georgiann Hahn gap 08/16/2017 8  3 - 11 Final   Performed at [facility], [place] [address], [place], [place] [address]    (this displays the last labs from the last 3 days)  No results found for: TOTALPROTELP, ALBUMINELP, A1GS, A2GS, BETS, BETA2SER, GAMS, MSPIKE, SPEI (this displays SPEP labs)  No results found for: KPAFRELGTCHN, LAMBDASER, KAPLAMBRATIO (kappa/lambda light chains)  No results found for: HGBA, HGBA2QUANT, HGBFQUANT, HGBSQUAN (Hemoglobinopathy evaluation)   No results found for: LDH  No results found for: IRON, TIBC, IRONPCTSAT (Iron and TIBC)  No results found for: FERRITIN  Urinalysis    Component Value Date/Time   BILIRUBINUR n 08/25/2016 1407   KETONESUR trace (5) (A) 08/15/2016 1732   PROTEINUR n 08/25/2016 1407   UROBILINOGEN 0.2 08/25/2016 1407   NITRITE n 08/25/2016 1407   LEUKOCYTESUR Negative 08/25/2016 1407     STUDIES: US Breast Ltd Uni Left Inc Axilla  Result Date: 08/04/2017 CLINICAL DATA:  62 year old female presenting for evaluation of a palpable left axillary mass. EXAM: DIGITAL DIAGNOSTIC BILATERAL MAMMOGRAM WITH CAD AND TOMO LEFT BREAST ULTRASOUND COMPARISON:  Previous exam(s). ACR Breast Density Category b: There are scattered areas of fibroglandular density. FINDINGS: In the upper-outer quadrant of the left breast there is an oval mass with indistinct margins measuring approximately 2 cm. Partially  visualized in the left axilla, a deep to the palpable marker are at least 2 abnormal left axillary lymph nodes. No other suspicious calcifications, masses or areas of distortion are seen in the bilateral breasts. Mammographic images were processed with CAD. Physical exam of the upper-outer left breast demonstrates a palpable mass at approximately 2 o'clock. Superior to this, into the axilla is a large tender palpable mass which corresponds with the area of concern identified by the patient. Ultrasound of the left breast at 2 o'clock, 2 cm from the nipple demonstrates an oval hypoechoic mass with indistinct margins measuring 2.3 x 1.6 x 2.2 cm. Two markedly enlarged lymph nodes are seen in the left axilla, the largest with a cortical thickness of at least 2.5 cm. IMPRESSION: 1. There is a highly suspicious mass in the left breast at 2 o'clock measuring 2.3 cm. 2. There are 2 markedly enlarged abnormal left axillary lymph nodes at the palpable site in the left axilla. 3.  No mammographic evidence of malignancy in the right breast. RECOMMENDATION: Ultrasound guided biopsy is recommended for the left breast mass and 1 of the abnormal left axillary lymph nodes. This procedure has been scheduled for 08/07/2017 at 3:45 p.m. I have discussed the findings and recommendations with the patient. Results were also provided in writing at the conclusion of the visit. If applicable, a reminder letter will be sent to the patient regarding the next appointment. BI-RADS CATEGORY  5: Highly suggestive of malignancy. Electronically Signed   By: Ammie Ferrier M.D.   On: 08/04/2017 10:21   Mm Diag Breast Tomo Bilateral  Result Date: 08/04/2017 CLINICAL DATA:  62 year old female presenting for evaluation of a palpable left axillary mass.  EXAM: DIGITAL DIAGNOSTIC BILATERAL MAMMOGRAM WITH CAD AND TOMO LEFT BREAST ULTRASOUND COMPARISON:  Previous exam(s). ACR Breast Density Category b: There are scattered areas of fibroglandular  density. FINDINGS: In the upper-outer quadrant of the left breast there is an oval mass with indistinct margins measuring approximately 2 cm. Partially visualized in the left axilla, a deep to the palpable marker are at least 2 abnormal left axillary lymph nodes. No other suspicious calcifications, masses or areas of distortion are seen in the bilateral breasts. Mammographic images were processed with CAD. Physical exam of the upper-outer left breast demonstrates a palpable mass at approximately 2 o'clock. Superior to this, into the axilla is a large tender palpable mass which corresponds with the area of concern identified by the patient. Ultrasound of the left breast at 2 o'clock, 2 cm from the nipple demonstrates an oval hypoechoic mass with indistinct margins measuring 2.3 x 1.6 x 2.2 cm. Two markedly enlarged lymph nodes are seen in the left axilla, the largest with a cortical thickness of at least 2.5 cm. IMPRESSION: 1. There is a highly suspicious mass in the left breast at 2 o'clock measuring 2.3 cm. 2. There are 2 markedly enlarged abnormal left axillary lymph nodes at the palpable site in the left axilla. 3.  No mammographic evidence of malignancy in the right breast. RECOMMENDATION: Ultrasound guided biopsy is recommended for the left breast mass and 1 of the abnormal left axillary lymph nodes. This procedure has been scheduled for 08/07/2017 at 3:45 p.m. I have discussed the findings and recommendations with the patient. Results were also provided in writing at the conclusion of the visit. If applicable, a reminder letter will be sent to the patient regarding the next appointment. BI-RADS CATEGORY  5: Highly suggestive of malignancy. Electronically Signed   By: Ammie Ferrier M.D.   On: 08/04/2017 10:21   [place] Axillary Node Core Biopsy Left  Addendum Date: 08/09/2017   ADDENDUM REPORT: 08/08/2017 14:15 ADDENDUM: Pathology revealed GRADE III INVASIVE DUCTAL CARCINOMA of the Left breast, 2 o'clock,  (ribbon clip). METASTATIC CARCINOMA of the Left axillary node (spiral clip). This was found to be concordant by Dr. Nolon Nations. Pathology results were discussed with the patient by telephone. The patient reported doing well after the biopsies with tenderness at the sites. Post biopsy instructions and care were reviewed and questions were answered. The patient was encouraged to call [facility] for any additional concerns. The patient was referred to [facility] on August 16, 2017. Pathology results reported by Terie Purser, RN on 08/08/2017. Electronically Signed   By: Nolon Nations M.D.   On: 08/08/2017 14:15   Result Date: 08/09/2017 CLINICAL DATA:  Patient presents for ultrasound-guided core biopsy of mass in the 2 o'clock location of the LEFT breast and an enlarged LOWER LEFT axillary lymph node. EXAM: ULTRASOUND GUIDED LEFT BREAST CORE NEEDLE BIOPSY ULTRASOUND-GUIDED LEFT AXILLARY CORE NEEDLE BIOPSY COMPARISON:  Previous exam(s). FINDINGS: I met with the patient and we discussed the procedure of ultrasound-guided biopsy, including benefits and alternatives. We discussed the high likelihood of a successful procedure. We discussed the risks of the procedure, including infection, bleeding, tissue injury, clip migration, and inadequate sampling. Informed written consent was given. The usual time-out protocol was performed immediately prior to the procedure. Lesion 1: Ribbon shaped clip, 2 o'clock location of the LEFT breast: Lesion quadrant: UPPER-OUTER QUADRANT LEFT breast.Using sterile technique and 1% Lidocaine as local  anesthetic, under direct ultrasound visualization, a 12 gauge spring-loaded device was used to perform biopsy of mass in the 2 o'clock location of the LEFT breast using a LATERAL to MEDIAL approach. At the conclusion of the procedure a ribbon shaped tissue marker clip was deployed into  the biopsy cavity. Lesion 2: Spiral shaped clip, LEFT axilla: Lesion QUADRANT: LEFT axilla. Using sterile technique and 1% lidocaine as local anesthetic, under direct ultrasound visualization a 14 gauge spring-loaded device was used to perform biopsy of a mass in the LEFT axilla using a LATERAL to MEDIAL approach. At the conclusion of procedure a spiral shaped HydroMARK clip was deployed into the biopsy cavity. Follow up 2 view mammogram was performed and dictated separately. IMPRESSION: Ultrasound guided biopsy of mass in the 2 o'clock location of the LEFT breast and enlarged LEFT axillary lymph node. No apparent complications. Electronically Signed: By: Nolon Nations M.D. On: 08/07/2017 16:30   Mm Clip Placement Left  Result Date: 08/08/2017 CLINICAL DATA:  Status post ultrasound-guided core biopsy of mass in the 2 o'clock location of the LEFT breast and an enlarged LEFT axillary lymph node. EXAM: DIAGNOSTIC LEFT MAMMOGRAM POST ULTRASOUND BIOPSY x2 COMPARISON:  Previous exam(s). FINDINGS: Mammographic images were obtained following ultrasound guided biopsy of mass in the 2 o'clock location of the LEFT breast and placement of a ribbon shaped clip. The clip is identified within the mass in the [place] as expected. Spiral shaped clip is identified in the enlarged LOWER LEFT axillary lymph node. IMPRESSION: Tissue marker clips are in the expected locations after biopsy. Final Assessment: Post Procedure Mammograms for Marker Placement Electronically Signed   By: Nolon Nations M.D.   On: 08/08/2017 08:36   [place] Lt Breast Bx W Loc Dev 1st Lesion Img Bx Spec US Guide  Addendum Date: 08/09/2017   ADDENDUM REPORT: 08/08/2017 14:15 ADDENDUM: Pathology revealed GRADE III INVASIVE DUCTAL CARCINOMA of the Left breast, 2 o'clock, (ribbon clip). METASTATIC CARCINOMA of the Left axillary node (spiral clip). This was found to be concordant by Dr. Nolon Nations. Pathology results were discussed with the  patient by telephone. The patient reported doing well after the biopsies with tenderness at the sites. Post biopsy instructions and care were reviewed and questions were answered. The patient was encouraged to call [facility] for any additional concerns. The patient was referred to [facility] on August 16, 2017. Pathology results reported by Terie Purser, RN on 08/08/2017. Electronically Signed   By: Nolon Nations M.D.   On: 08/08/2017 14:15   Result Date: 08/09/2017 CLINICAL DATA:  Patient presents for ultrasound-guided core biopsy of mass in the 2 o'clock location of the LEFT breast and an enlarged LOWER LEFT axillary lymph node. EXAM: ULTRASOUND GUIDED LEFT BREAST CORE NEEDLE BIOPSY ULTRASOUND-GUIDED LEFT AXILLARY CORE NEEDLE BIOPSY COMPARISON:  Previous exam(s). FINDINGS: I met with the patient and we discussed the procedure of ultrasound-guided biopsy, including benefits and alternatives. We discussed the high likelihood of a successful procedure. We discussed the risks of the procedure, including infection, bleeding, tissue injury, clip migration, and inadequate sampling. Informed written consent was given. The usual time-out protocol was performed immediately prior to the procedure. Lesion 1: Ribbon shaped clip, 2 o'clock location of the LEFT breast: Lesion quadrant: UPPER-OUTER QUADRANT LEFT breast.Using sterile technique and 1% Lidocaine as local anesthetic, under direct ultrasound visualization, a 12 gauge spring-loaded device was used to perform biopsy of  mass in the 2 o'clock location of the LEFT breast using a LATERAL to MEDIAL approach. At the conclusion of the procedure a ribbon shaped tissue marker clip was deployed into the biopsy cavity. Lesion 2: Spiral shaped clip, LEFT axilla: Lesion QUADRANT: LEFT axilla. Using sterile technique and 1% lidocaine as local anesthetic, under direct  ultrasound visualization a 14 gauge spring-loaded device was used to perform biopsy of a mass in the LEFT axilla using a LATERAL to MEDIAL approach. At the conclusion of procedure a spiral shaped HydroMARK clip was deployed into the biopsy cavity. Follow up 2 view mammogram was performed and dictated separately. IMPRESSION: Ultrasound guided biopsy of mass in the 2 o'clock location of the LEFT breast and enlarged LEFT axillary lymph node. No apparent complications. Electronically Signed: By: Nolon Nations M.D. On: 08/07/2017 16:30    ELIGIBLE FOR AVAILABLE RESEARCH PROTOCOL: BCEP  ASSESSMENT: 62 y.o. [place], [place] woman status post left breast upper outer quadrant and left axillary lymph node biopsy 08/07/2017 for a clinical T2 N1, stage II invasive ductal carcinoma, grade 3, estrogen receptor weakly positive, progesterone receptor negative, but HER-2 amplified, with an MIB-1 of 80%  (1) neoadjuvant chemotherapy will consist of carboplatin, docetaxel, trastuzumab, and Pertuzumab given every 21 days x 6  (2) trastuzumab will be continued to complete 6 months  (3) definitive surgery to follow  (4) adjuvant radiation to follow surgery  (5) will receive antiestrogens at the completion of local therapy  PLAN: We spent the better part of today's hour-long appointment discussing the biology of her diagnosis and the specifics of her situation. We first reviewed the fact that cancer is not one disease but more than 100 different diseases and that it is important to keep them separate-- otherwise when friends and relatives discuss their own cancer experiences with [place] confusion can result. Similarly we explained that if breast cancer spreads to the bone or liver, the patient would not have bone cancer or liver cancer, but breast cancer in the bone and breast cancer in the liver: one cancer in three places-- not 3 different cancers which otherwise would have to be treated in 3 different ways.  We  discussed the difference between local and systemic therapy. In terms of loco-regional treatment, lumpectomy plus radiation is equivalent to mastectomy as far as survival is concerned. For this reason, and because the cosmetic results are generally superior, we recommend breast conserving surgery.   We also noted that in terms of sequencing of treatments, whether systemic therapy or surgery is done first does not affect the ultimate outcome.  Introduced case, we strongly recommend starting with chemotherapy as this will make her surgery easier and give [place] additional prognostic information in terms of response.  I let her know that she has a 40% or so chance of having no residual disease in the breast after chemo/immunotherapy.  We then discussed the rationale for systemic therapy. There is some risk that this cancer may have already spread to other parts of her body.  We are going to obtain a chest CT scan and a bone scan given the lymph node positivity in her case.  She understands I expect these to be negative but even if they are negative that does not mean she does not have occult disease.  In fact the chance of her having occult disease outside the breast are high that is why she needs systemic therapy  Next we went over the options for systemic therapy which are anti-estrogens, anti-HER-2 immunotherapy,  and chemotherapy. [place] does meet criteria for anti-HER-2 immunotherapy as well as antiestrogens, although the benefit from antiestrogens would be questionable given the weak positivity noted and the progesterone negativity.  She is also a good candidate for chemotherapy.  Ensure she will receive all 3 systemic modalities.  I discussed the possible toxicity side effects and complications of carboplatin and docetaxel as well as trastuzumab and Pertuzumab.  The target start date is September 07, 2017.  Before then she will be scanned, have a port placed, have an echocardiogram, and come to chemotherapy school.   I have also suggested she consider the BCEP study and she will be contacted by 1 of the study nurses regarding participation  Bethena Roys has expressed some interest in obtaining a second opinion and she is very welcome to.  She will let us know if she wants [place] to help her set that up.  I am going to see her again on July 9 to review all her pretreatment studies, and give her detailed instructions on how to take her supportive medications  Dudley has a good understanding of the overall plan. She agrees with it. She knows the goal of treatment in her case is cure. She will call with any problems that may develop before her next visit here.  Shawnie Dapper   08/16/2017 9:55 AM Medical Oncology and Hematology [facility] [address] [place], [place] [address] Tel. [phone]    Fax. [phone]

## 2017-08-16 ENCOUNTER — Inpatient Hospital Stay: Payer: 59

## 2017-08-16 ENCOUNTER — Ambulatory Visit: Payer: Self-pay | Admitting: Surgery

## 2017-08-16 ENCOUNTER — Inpatient Hospital Stay: Payer: 59 | Attending: Oncology | Admitting: Oncology

## 2017-08-16 ENCOUNTER — Other Ambulatory Visit: Payer: Self-pay | Admitting: *Deleted

## 2017-08-16 ENCOUNTER — Ambulatory Visit
Admission: RE | Admit: 2017-08-16 | Discharge: 2017-08-16 | Disposition: A | Discharge status: Home / Self Care | Payer: 59 | Source: Ambulatory Visit | Attending: Radiation Oncology | Admitting: Radiation Oncology

## 2017-08-16 ENCOUNTER — Encounter: Payer: Self-pay | Admitting: Oncology

## 2017-08-16 VITALS — BP 150/69 | HR 63 | Temp 98.5°F | Resp 18 | Ht 67.75 in | Wt 199.4 lb

## 2017-08-16 DIAGNOSIS — C50412 Malignant neoplasm of upper-outer quadrant of left female breast: Secondary | ICD-10-CM

## 2017-08-16 DIAGNOSIS — Z170 Estrogen receptor positive status [ER+]: Secondary | ICD-10-CM | POA: Diagnosis not present

## 2017-08-16 DIAGNOSIS — C773 Secondary and unspecified malignant neoplasm of axilla and upper limb lymph nodes: Secondary | ICD-10-CM | POA: Insufficient documentation

## 2017-08-16 DIAGNOSIS — R590 Localized enlarged lymph nodes: Secondary | ICD-10-CM | POA: Diagnosis not present

## 2017-08-16 DIAGNOSIS — C50912 Malignant neoplasm of unspecified site of left female breast: Secondary | ICD-10-CM | POA: Diagnosis not present

## 2017-08-16 LAB — CBC WITH DIFFERENTIAL (CANCER CENTER ONLY)
Basophils Absolute: 0.1 10*3/uL (ref 0.0–0.1)
Basophils Relative: 1 %
Eosinophils Absolute: 0.3 10*3/uL (ref 0.0–0.5)
Eosinophils Relative: 4 %
HCT: 37.8 % (ref 34.8–46.6)
Hemoglobin: 12.3 g/dL (ref 11.6–15.9)
Lymphocytes Relative: 33 %
Lymphs Abs: 2.6 10*3/uL (ref 0.9–3.3)
MCH: 26 pg (ref 25.1–34.0)
MCHC: 32.5 g/dL (ref 31.5–36.0)
MCV: 80 fL (ref 79.5–101.0)
Monocytes Absolute: 0.5 10*3/uL (ref 0.1–0.9)
Monocytes Relative: 7 %
Neutro Abs: 4.2 10*3/uL (ref 1.5–6.5)
Neutrophils Relative %: 55 %
Platelet Count: 343 10*3/uL (ref 145–400)
RBC: 4.72 MIL/uL (ref 3.70–5.45)
RDW: 14.2 % (ref 11.2–14.5)
WBC Count: 7.7 10*3/uL (ref 3.9–10.3)

## 2017-08-16 LAB — CMP (CANCER CENTER ONLY)
ALT: 20 U/L (ref 0–55)
AST: 19 U/L (ref 5–34)
Albumin: 4.2 g/dL (ref 3.5–5.0)
Alkaline Phosphatase: 118 U/L (ref 40–150)
Anion gap: 8 (ref 3–11)
BUN: 11 mg/dL (ref 7–26)
CO2: 26 mmol/L (ref 22–29)
Calcium: 9.9 mg/dL (ref 8.4–10.4)
Chloride: 107 mmol/L (ref 98–109)
Creatinine: 0.97 mg/dL (ref 0.60–1.10)
GFR, Est AFR Am: >60 mL/min (ref >60)
GFR, Estimated: >60 mL/min (ref >60)
Glucose, Bld: 109 mg/dL (ref 70–140)
Potassium: 3.9 mmol/L (ref 3.5–5.1)
Sodium: 141 mmol/L (ref 136–145)
Total Bilirubin: 0.6 mg/dL (ref 0.2–1.2)
Total Protein: 8.3 g/dL (ref 6.4–8.3)

## 2017-08-16 NOTE — Progress Notes (Signed)
Nutrition Assessment  Reason for Assessment:  Pt seen in Breast Clinic  ASSESSMENT:  62 year old female with new diagnosis of breast cancer.    Patient reports good appetite Medications:  reviewed  Labs: reviewed  Anthropometrics:   Height: 67 inches Weight: 199 lb 6.4 BMI: 30   NUTRITION DIAGNOSIS: Food and nutrition related knowledge deficit related to new diagnosis of breast cancer as evidenced by no prior need for nutrition related information.  INTERVENTION:   Discussed and provided packet of information regarding nutritional tips for breast cancer patients.  Questions answered.  Teachback method used.  Contact information provided and patient knows to contact me with questions/concerns.    MONITORING, EVALUATION, and GOAL: Pt will consume a healthy plant based diet to maintain lean body mass throughout treatment.   Julane Crock B. Zenia Resides, [place], [place] Registered Dietitian [phone] (pager)

## 2017-08-16 NOTE — Progress Notes (Signed)
START ON PATHWAY REGIMEN - Breast     A cycle is every 21 days:     Pertuzumab      Pertuzumab      Trastuzumab      Trastuzumab      Carboplatin      Docetaxel   **Always confirm dose/schedule in your pharmacy ordering system**  Patient Characteristics: Preoperative or Nonsurgical Candidate (Clinical Staging), Neoadjuvant Therapy followed by Surgery, Invasive Disease, Chemotherapy, HER2 Positive, ER Positive Therapeutic Status: Preoperative or Nonsurgical Candidate (Clinical Staging) AJCC M Category: cM0 AJCC Grade: G3 Breast Surgical Plan: Neoadjuvant Therapy followed by Surgery ER Status: Positive (+) AJCC 8 Stage Grouping: IIB HER2 Status: Positive (+) AJCC T Category: cT2 AJCC N Category: cN1 PR Status: Negative (-) Intent of Therapy: Curative Intent, Discussed with Patient 

## 2017-08-16 NOTE — Progress Notes (Signed)
Radiation Oncology         [phone] ________________________________  Name: Madison Hopkins        MRN: [id number]  Date of Service: 08/16/2017 DOB: Jan 21, 1956  CC:Leonie Douglas, PA-C  Rozetta Nunnery, *     REFERRING PHYSICIAN: Melony Overly E, *   DIAGNOSIS: The encounter diagnosis was Malignant neoplasm of upper-outer quadrant of left breast in female, estrogen receptor positive ([place]).   HISTORY OF PRESENT ILLNESS: Madison Hopkins is a 62 y.o. female seen in the multidisciplinary breast clinic for a new diagnosis of left breast cancer. The patient was noted to have a palpable mass in the axilla. She proceeded with a mammogram which revealed a mass in the upper outer quadrant of the left breast as well as a large mass in the left axilla. She proceeded with diagnostic imaging that revealed a 2.3 x 2.2 x 1.6 cm mass in the 2:00 position of the left breast as well as two enlarged nodes in the axilla measuring at least 2.5 cm. She underwent a biopsy of both sites on 08/07/17 that revealed a grade 3 invasive ductal carcinoma of the breast and node. The cancer was ER positive with weak staining, and PR negative, HER2 was amplified and her Ki 67 was 80%. She comes today to discuss options of treatment for her cancer.   PREVIOUS RADIATION THERAPY: No   PAST MEDICAL HISTORY:  Past Medical History:  Diagnosis Date  . Abnormal Pap smear of vagina 07/28/2016   LGSIL; colpo 07/2016 atrophic squamous cells  . Allergy   . Elevated hemoglobin A1c 07/28/2016   level - 6.1  . Endometriosis   . Low vitamin D level 07/28/2016   level 24.7  . STD (sexually transmitted disease)        PAST SURGICAL HISTORY: Past Surgical History:  Procedure Laterality Date  . ABDOMINAL HYSTERECTOMY    . CESAREAN SECTION    . COLON SURGERY    . OOPHORECTOMY    . SMALL INTESTINE SURGERY    . SPINE SURGERY       FAMILY HISTORY:  Family History  Problem Relation Age of Onset  . Mental  illness Mother   . Alcoholism Mother   . Cirrhosis Mother   . Cancer Father      SOCIAL HISTORY:  reports that she has never smoked. She has never used smokeless tobacco. She reports that she drinks alcohol. She reports that she has current or past drug history. Drug: Marijuana. The patient is single and lives in [place]. She is accompanied by her brother, mother, son, and extended family. She works for [employer]   ALLERGIES: Penicillins   MEDICATIONS:  Current Outpatient Medications  Medication Sig Dispense Refill  . Ferrous Gluconate-C-Folic Acid (IRON-C PO) Take by mouth.    . fluticasone (FLONASE) 50 MCG/ACT nasal spray INHALE 1 SPRAY INTO THE NOSTRIL(S) EVERY DAY IN EACH NOSTRIL 16 g 5  . glucosamine-chondroitin 500-400 MG tablet Take 1 tablet by mouth 3 (three) times daily.    Marland Kitchen levocetirizine (XYZAL) 5 MG tablet every evening.  1   No current facility-administered medications for this encounter.      REVIEW OF SYSTEMS: On review of systems, the patient reports that she is doing well overall. She denies any chest pain, shortness of breath, cough, fevers, chills, night sweats, unintended weight changes. She denies any bowel or bladder disturbances, and denies abdominal pain, nausea or vomiting. She denies any new musculoskeletal or joint aches  or pains. A complete review of systems is obtained and is otherwise negative.     PHYSICAL EXAM:  Wt Readings from Last 3 Encounters:  07/31/17 201 lb (91.2 kg)  01/28/17 195 lb (88.5 kg)  01/24/17 200 lb 6.4 oz (90.9 kg)   Temp Readings from Last 3 Encounters:  01/28/17 98.1 F (36.7 C) (Oral)  01/24/17 98.8 F (37.1 C) (Oral)  12/26/16 98.6 F (37 C) (Oral)   BP Readings from Last 3 Encounters:  07/31/17 132/76  01/28/17 118/72  01/24/17 136/72   Pulse Readings from Last 3 Encounters:  07/31/17 68  01/28/17 82  01/24/17 83     In general this is a well appearing African American female in no acute distress.  She is alert and oriented x4 and appropriate throughout the examination. HEENT reveals that the patient is normocephalic, atraumatic. EOMs are intact. PERRLA. Skin is intact without any evidence of gross lesions. Cardiopulmonary assessment is negative for acute distress and she exhibits normal effort.    ECOG = 1  0 - Asymptomatic (Fully active, able to carry on all predisease activities without restriction)  1 - Symptomatic but completely ambulatory (Restricted in physically strenuous activity but ambulatory and able to carry out work of a light or sedentary nature. For example, light housework, office work)  2 - Symptomatic, <50% in bed during the day (Ambulatory and capable of all self care but unable to carry out any work activities. Up and about more than 50% of waking hours)  3 - Symptomatic, >50% in bed, but not bedbound (Capable of only limited self-care, confined to bed or chair 50% or more of waking hours)  4 - Bedbound (Completely disabled. Cannot carry on any self-care. Totally confined to bed or chair)  5 - Death   Eustace Pen MM, Creech RH, Tormey DC, et al. [phone]). "Toxicity and response criteria of the [facility]". [place] Oncol. 5 (6): 649-55    LABORATORY DATA:  Lab Results  Component Value Date   WBC 6.4 05/02/2014   HGB 12.0 (A) 05/02/2014   HCT 36.7 (A) 05/02/2014   MCV 80.2 05/02/2014   No results found for: NA, K, CL, CO2 No results found for: ALT, AST, GGT, ALKPHOS, BILITOT    RADIOGRAPHY: US Breast Ltd Uni Left Inc Axilla  Result Date: 08/04/2017 CLINICAL DATA:  62 year old female presenting for evaluation of a palpable left axillary mass. EXAM: DIGITAL DIAGNOSTIC BILATERAL MAMMOGRAM WITH CAD AND TOMO LEFT BREAST ULTRASOUND COMPARISON:  Previous exam(s). ACR Breast Density Category b: There are scattered areas of fibroglandular density. FINDINGS: In the upper-outer quadrant of the left breast there is an oval mass with indistinct  margins measuring approximately 2 cm. Partially visualized in the left axilla, a deep to the palpable marker are at least 2 abnormal left axillary lymph nodes. No other suspicious calcifications, masses or areas of distortion are seen in the bilateral breasts. Mammographic images were processed with CAD. Physical exam of the upper-outer left breast demonstrates a palpable mass at approximately 2 o'clock. Superior to this, into the axilla is a large tender palpable mass which corresponds with the area of concern identified by the patient. Ultrasound of the left breast at 2 o'clock, 2 cm from the nipple demonstrates an oval hypoechoic mass with indistinct margins measuring 2.3 x 1.6 x 2.2 cm. Two markedly enlarged lymph nodes are seen in the left axilla, the largest with a cortical thickness of at least 2.5 cm. IMPRESSION: 1. There is  a highly suspicious mass in the left breast at 2 o'clock measuring 2.3 cm. 2. There are 2 markedly enlarged abnormal left axillary lymph nodes at the palpable site in the left axilla. 3.  No mammographic evidence of malignancy in the right breast. RECOMMENDATION: Ultrasound guided biopsy is recommended for the left breast mass and 1 of the abnormal left axillary lymph nodes. This procedure has been scheduled for 08/07/2017 at 3:45 p.m. I have discussed the findings and recommendations with the patient. Results were also provided in writing at the conclusion of the visit. If applicable, a reminder letter will be sent to the patient regarding the next appointment. BI-RADS CATEGORY  5: Highly suggestive of malignancy. Electronically Signed   By: Ammie Ferrier M.D.   On: 08/04/2017 10:21   Mm Diag Breast Tomo Bilateral  Result Date: 08/04/2017 CLINICAL DATA:  62 year old female presenting for evaluation of a palpable left axillary mass. EXAM: DIGITAL DIAGNOSTIC BILATERAL MAMMOGRAM WITH CAD AND TOMO LEFT BREAST ULTRASOUND COMPARISON:  Previous exam(s). ACR Breast Density Category b:  There are scattered areas of fibroglandular density. FINDINGS: In the upper-outer quadrant of the left breast there is an oval mass with indistinct margins measuring approximately 2 cm. Partially visualized in the left axilla, a deep to the palpable marker are at least 2 abnormal left axillary lymph nodes. No other suspicious calcifications, masses or areas of distortion are seen in the bilateral breasts. Mammographic images were processed with CAD. Physical exam of the upper-outer left breast demonstrates a palpable mass at approximately 2 o'clock. Superior to this, into the axilla is a large tender palpable mass which corresponds with the area of concern identified by the patient. Ultrasound of the left breast at 2 o'clock, 2 cm from the nipple demonstrates an oval hypoechoic mass with indistinct margins measuring 2.3 x 1.6 x 2.2 cm. Two markedly enlarged lymph nodes are seen in the left axilla, the largest with a cortical thickness of at least 2.5 cm. IMPRESSION: 1. There is a highly suspicious mass in the left breast at 2 o'clock measuring 2.3 cm. 2. There are 2 markedly enlarged abnormal left axillary lymph nodes at the palpable site in the left axilla. 3.  No mammographic evidence of malignancy in the right breast. RECOMMENDATION: Ultrasound guided biopsy is recommended for the left breast mass and 1 of the abnormal left axillary lymph nodes. This procedure has been scheduled for 08/07/2017 at 3:45 p.m. I have discussed the findings and recommendations with the patient. Results were also provided in writing at the conclusion of the visit. If applicable, a reminder letter will be sent to the patient regarding the next appointment. BI-RADS CATEGORY  5: Highly suggestive of malignancy. Electronically Signed   By: Ammie Ferrier M.D.   On: 08/04/2017 10:21   [place] Axillary Node Core Biopsy Left  Addendum Date: 08/09/2017   ADDENDUM REPORT: 08/08/2017 14:15 ADDENDUM: Pathology revealed GRADE III INVASIVE DUCTAL  CARCINOMA of the Left breast, 2 o'clock, (ribbon clip). METASTATIC CARCINOMA of the Left axillary node (spiral clip). This was found to be concordant by Dr. Nolon Nations. Pathology results were discussed with the patient by telephone. The patient reported doing well after the biopsies with tenderness at the sites. Post biopsy instructions and care were reviewed and questions were answered. The patient was encouraged to call [facility] for any additional concerns. The patient was referred to [facility] on August 16, 2017. Pathology  results reported by Terie Purser, RN on 08/08/2017. Electronically Signed   By: Nolon Nations M.D.   On: 08/08/2017 14:15   Result Date: 08/09/2017 CLINICAL DATA:  Patient presents for ultrasound-guided core biopsy of mass in the 2 o'clock location of the LEFT breast and an enlarged LOWER LEFT axillary lymph node. EXAM: ULTRASOUND GUIDED LEFT BREAST CORE NEEDLE BIOPSY ULTRASOUND-GUIDED LEFT AXILLARY CORE NEEDLE BIOPSY COMPARISON:  Previous exam(s). FINDINGS: I met with the patient and we discussed the procedure of ultrasound-guided biopsy, including benefits and alternatives. We discussed the high likelihood of a successful procedure. We discussed the risks of the procedure, including infection, bleeding, tissue injury, clip migration, and inadequate sampling. Informed written consent was given. The usual time-out protocol was performed immediately prior to the procedure. Lesion 1: Ribbon shaped clip, 2 o'clock location of the LEFT breast: Lesion quadrant: UPPER-OUTER QUADRANT LEFT breast.Using sterile technique and 1% Lidocaine as local anesthetic, under direct ultrasound visualization, a 12 gauge spring-loaded device was used to perform biopsy of mass in the 2 o'clock location of the LEFT breast using a LATERAL to MEDIAL approach. At the conclusion of the procedure a ribbon  shaped tissue marker clip was deployed into the biopsy cavity. Lesion 2: Spiral shaped clip, LEFT axilla: Lesion QUADRANT: LEFT axilla. Using sterile technique and 1% lidocaine as local anesthetic, under direct ultrasound visualization a 14 gauge spring-loaded device was used to perform biopsy of a mass in the LEFT axilla using a LATERAL to MEDIAL approach. At the conclusion of procedure a spiral shaped HydroMARK clip was deployed into the biopsy cavity. Follow up 2 view mammogram was performed and dictated separately. IMPRESSION: Ultrasound guided biopsy of mass in the 2 o'clock location of the LEFT breast and enlarged LEFT axillary lymph node. No apparent complications. Electronically Signed: By: Nolon Nations M.D. On: 08/07/2017 16:30   Mm Clip Placement Left  Result Date: 08/08/2017 CLINICAL DATA:  Status post ultrasound-guided core biopsy of mass in the 2 o'clock location of the LEFT breast and an enlarged LEFT axillary lymph node. EXAM: DIAGNOSTIC LEFT MAMMOGRAM POST ULTRASOUND BIOPSY x2 COMPARISON:  Previous exam(s). FINDINGS: Mammographic images were obtained following ultrasound guided biopsy of mass in the 2 o'clock location of the LEFT breast and placement of a ribbon shaped clip. The clip is identified within the mass in the [place] as expected. Spiral shaped clip is identified in the enlarged LOWER LEFT axillary lymph node. IMPRESSION: Tissue marker clips are in the expected locations after biopsy. Final Assessment: Post Procedure Mammograms for Marker Placement Electronically Signed   By: Nolon Nations M.D.   On: 08/08/2017 08:36   [place] Lt Breast Bx W Loc Dev 1st Lesion Img Bx Spec US Guide  Addendum Date: 08/09/2017   ADDENDUM REPORT: 08/08/2017 14:15 ADDENDUM: Pathology revealed GRADE III INVASIVE DUCTAL CARCINOMA of the Left breast, 2 o'clock, (ribbon clip). METASTATIC CARCINOMA of the Left axillary node (spiral clip). This was found to be concordant by Dr. Nolon Nations.  Pathology results were discussed with the patient by telephone. The patient reported doing well after the biopsies with tenderness at the sites. Post biopsy instructions and care were reviewed and questions were answered. The patient was encouraged to call [facility] for any additional concerns. The patient was referred to [facility] at [facility] on August 16, 2017. Pathology results reported by Terie Purser, RN on 08/08/2017. Electronically Signed   By: Nolon Nations  M.D.   On: 08/08/2017 14:15   Result Date: 08/09/2017 CLINICAL DATA:  Patient presents for ultrasound-guided core biopsy of mass in the 2 o'clock location of the LEFT breast and an enlarged LOWER LEFT axillary lymph node. EXAM: ULTRASOUND GUIDED LEFT BREAST CORE NEEDLE BIOPSY ULTRASOUND-GUIDED LEFT AXILLARY CORE NEEDLE BIOPSY COMPARISON:  Previous exam(s). FINDINGS: I met with the patient and we discussed the procedure of ultrasound-guided biopsy, including benefits and alternatives. We discussed the high likelihood of a successful procedure. We discussed the risks of the procedure, including infection, bleeding, tissue injury, clip migration, and inadequate sampling. Informed written consent was given. The usual time-out protocol was performed immediately prior to the procedure. Lesion 1: Ribbon shaped clip, 2 o'clock location of the LEFT breast: Lesion quadrant: UPPER-OUTER QUADRANT LEFT breast.Using sterile technique and 1% Lidocaine as local anesthetic, under direct ultrasound visualization, a 12 gauge spring-loaded device was used to perform biopsy of mass in the 2 o'clock location of the LEFT breast using a LATERAL to MEDIAL approach. At the conclusion of the procedure a ribbon shaped tissue marker clip was deployed into the biopsy cavity. Lesion 2: Spiral shaped clip, LEFT axilla: Lesion QUADRANT: LEFT axilla. Using sterile technique and 1% lidocaine  as local anesthetic, under direct ultrasound visualization a 14 gauge spring-loaded device was used to perform biopsy of a mass in the LEFT axilla using a LATERAL to MEDIAL approach. At the conclusion of procedure a spiral shaped HydroMARK clip was deployed into the biopsy cavity. Follow up 2 view mammogram was performed and dictated separately. IMPRESSION: Ultrasound guided biopsy of mass in the 2 o'clock location of the LEFT breast and enlarged LEFT axillary lymph node. No apparent complications. Electronically Signed: By: Nolon Nations M.D. On: 08/07/2017 16:30       IMPRESSION/PLAN: 1. Stage IIB, cT2N1M0, grade 3 ER positive, HER2 amplified invasive ductal carcinoma of the left breast. Dr. Lisbeth Renshaw discusses the pathology findings and reviews the nature of HER2 amplified, left sided breast disease. The consensus from the breast conference includes the consideration of neoadjuvant chemotherapy following MRI and PAC placement. Following her course she would undergo surgical resection, and approach is to be determined. Given her involved nodes, Dr. Lisbeth Renshaw reviews the rational for adjuvant radiotherapy. We discussed the risks, benefits, short, and long term effects of radiotherapy, and the patient is interested in proceeding. Dr. Lisbeth Renshaw discusses the delivery and logistics of radiotherapy and anticipates a course of 6 1/2 weeks of radiotherapy with deep inspiration breath hold technique to the breast/chest wall and regional node sites. We will see her back about 2 weeks after surgery to discuss the simulation process and anticipate we starting radiotherapy about 4-6 weeks after surgery.   In a visit lasting 45 minutes, greater than 50% of the time was spent face to face discussing her case, and coordinating the patient's care.  The above documentation reflects my direct findings during this shared patient visit. Please see the separate note by Dr. Lisbeth Renshaw on this date for the remainder of the patient's plan of  care.    Carola Rhine, PAC

## 2017-08-17 ENCOUNTER — Telehealth: Payer: Self-pay | Admitting: Oncology

## 2017-08-17 ENCOUNTER — Ambulatory Visit: Payer: 59 | Admitting: Obstetrics and Gynecology

## 2017-08-17 ENCOUNTER — Other Ambulatory Visit: Payer: Self-pay

## 2017-08-17 ENCOUNTER — Encounter: Payer: Self-pay | Admitting: Obstetrics and Gynecology

## 2017-08-17 DIAGNOSIS — R87610 Atypical squamous cells of undetermined significance on cytologic smear of cervix (ASC-US): Secondary | ICD-10-CM

## 2017-08-17 NOTE — Telephone Encounter (Signed)
Per 6/19 no los °

## 2017-08-17 NOTE — Progress Notes (Signed)
  Subjective:     Patient ID: Madison Hopkins, female   DOB: [birth year], 62 y.o.   MRN: [id number]  HPI  Pap History: 07/31/17 ASCUS:Neg HR HPV.negative for BV, Candida, and trichomonas.  Still has irritation.  09/09/16 colpo biopsy showing atrophic squamous cells (menopausal change); 07/28/16 LGSIL  New dx of metastatic breast cancer.  Had appt yesterday at cancer center.   Review of Systems  LMP: unknown Contraception: Hysterectomy/BSO     Objective:   Physical Exam  Genitourinary:      Colposcopy Consent for procedure.  3% acetic acid used.  White light and green light filter used.  Cervix absent.  No lesions or acetoewhite change noted.  Atrophy seen.  Lugol's placed.  Decreased lugol's uptake left vaginal apex over 1 cm area.  Bx done and sent to pathology.  Monsel's placed.  Minimal EBL.  No complications.     Assessment:     ASCUS pap and negative HR HPV.  Hx prior LGSIL.  New dx metastatic breast cancer.     Plan:     FU biopsy.  Instructions/precautions given.  Support and encouragement given for new diagnosis of cancer.  She has a great team taking care of her!  After visit summary to patient.

## 2017-08-17 NOTE — Patient Instructions (Signed)

## 2017-08-18 ENCOUNTER — Encounter: Payer: Self-pay | Admitting: *Deleted

## 2017-08-18 ENCOUNTER — Encounter: Payer: Self-pay | Admitting: General Practice

## 2017-08-18 NOTE — Progress Notes (Signed)
[facility] Psychosocial Distress Screening Spiritual Care  Spoke with Madison Hopkins by phone following  [facility] to introduce [facility] team/resources, reviewing distress screen per protocol.  The patient scored a 5 on the Psychosocial Distress Thermometer which indicates moderate distress. Also assessed for distress and other psychosocial needs.   ONCBCN DISTRESS SCREENING 08/18/2017  Screening Type Initial Screening  Distress experienced in past week (1-10) 5  Emotional problem type Nervousness/Anxiety;Adjusting to illness  Information Concerns Type Lack of info about treatment;Lack of info about complementary therapy choices  Physical Problem type Pain;Loss of appetitie;Skin dry/itchy  Referral to support programs Yes   Madison Hopkins is using her resources--including faith, family, and friends who are survivors--to cope with the distress of dx. She is processing the emotional ups and downs related to fear and anxiety. This weekend she is traveling to enjoy children and grandchildren at a lake in [place], with swimming, a cookout, and much love and support. Anticipating the joy of being present is a meaningful distraction from worry and means to focus on what is most important in life.  Reflected back to her the skills that she is using in this kind of emotional work. Normalized feelings, providing empathic listening, pastoral reflection, and emotional support.   Follow up needed: No. Encouraged Madison Hopkins to utilize [employer]. She is already familiar with [employer] and is aware of ongoing [employer] as well. Per pt, no other needs at this time, but please page if needs arise or circumstances change. Thank you.   [place], [place], [facility] Pager [phone] Voicemail [phone]

## 2017-08-21 ENCOUNTER — Encounter: Payer: Self-pay | Admitting: Obstetrics and Gynecology

## 2017-08-21 ENCOUNTER — Encounter: Payer: Self-pay | Admitting: Oncology

## 2017-08-21 NOTE — Progress Notes (Signed)
Called patient to introduce myself as Financial Resource Specialist and to see if she has any financial questions or concerns. Patient not sure what her ded/OOP amounts are and if she has met them or close to it or not. Advised patient to contact her insurance company to obtain specific plan details and to date amounts/ She verbalized understanding. Advised her there are copay assistance programs available for some of her treament drugs if we need to apply. She will contact her insurance company prior to chemo ed and meet with me after class on 08/28/17. Gave her my contact name and number. ° °Discussed the one-time $1000 [employer] grant to help with personal expenses while going through treamtent. Verbally provided her with the income guidelines. She states she is over the income amount for a household of 1. Advised her if anything changes with her income and she falls below that amount, to contact me and we can revisit. She verbalized understanding. ° °She had questions regarding FMLA for herself and children. Advised her that if she has the paperwork she can complete her portion and bring in for Stacey Green to complete the rest and method of how she would like it returned. Gave her Stacey's contact information as well. She states her children live out of state. Provided fax number where paperwork can be sent for them. She states she has no further questions at this time. Advised her to contact me should anything come up.        °

## 2017-08-22 ENCOUNTER — Telehealth: Payer: Self-pay | Admitting: *Deleted

## 2017-08-22 NOTE — Telephone Encounter (Signed)
Spoke to pt concerning [place] from 6.19.19. Denies questions or concerns regarding dx or treatment plan. Discussed future appts and confirmed date and time. Encourage pt to call with needs. Received verbal understanding.

## 2017-08-23 ENCOUNTER — Ambulatory Visit
Admission: RE | Admit: 2017-08-23 | Discharge: 2017-08-23 | Disposition: A | Discharge status: Home / Self Care | Payer: 59 | Source: Ambulatory Visit | Attending: Oncology | Admitting: Oncology

## 2017-08-23 DIAGNOSIS — C50912 Malignant neoplasm of unspecified site of left female breast: Secondary | ICD-10-CM | POA: Diagnosis not present

## 2017-08-23 DIAGNOSIS — Z170 Estrogen receptor positive status [ER+]: Principal | ICD-10-CM

## 2017-08-23 DIAGNOSIS — C50412 Malignant neoplasm of upper-outer quadrant of left female breast: Secondary | ICD-10-CM

## 2017-08-23 MED ORDER — GADOBENATE DIMEGLUMINE 529 MG/ML IV SOLN
18.0000 mL | Freq: Once | INTRAVENOUS | Status: AC | PRN
  Administered 2017-08-23: 18 mL via INTRAVENOUS

## 2017-08-23 NOTE — Pre-Procedure Instructions (Signed)
ALESHKA CORNEY  08/23/2017      [id number] [address], [place] [address] Phone: [phone] Fax: [phone]    Your procedure is scheduled on Wed., September 06, 2017 from 9:00AM-10:00AM  Report to [facility] Admitting Entrance "A" at 7:00AM  Call this number if you have problems the morning of surgery:  [phone]   Remember:  Do not eat or drink after midnight on July 9th  Please complete your PRE-SURGERY ENSURE that was given to by 6:00AM the morning of surgery.  Please, if able, drink it in one setting. DO NOT SIP.   Take these medicines the morning of surgery with A SIP OF WATER By 6:00AM: Cetirizine (ZYRTEC). If needed Fluticasone (FLONASE) and Eye drops  7 days before surgery (7/3), stop taking all Other Aspirin Products, Vitamins, Fish oils, and Herbal medications. Also stop all NSAIDS i.e. Advil, Ibuprofen, Motrin, Aleve, Anaprox, Naproxen, BC, Goody Powders, and all Supplements.    Do not wear jewelry, make-up or nail polish.  Do not wear lotions, powders, or perfumes, or deodorant.  Do not shave 48 hours prior to surgery.    Do not bring valuables to the hospital.  [facility] is not responsible for any belongings or valuables.  Contacts, dentures or bridgework may not be worn into surgery.  Leave your suitcase in the car.  After surgery it may be brought to your room.  For patients admitted to the hospital, discharge time will be determined by your treatment team.  Patients discharged the day of surgery will not be allowed to drive home.   Special instructions:  [facility]- Preparing For Surgery  Before surgery, you can play an important role. Because skin is not sterile, your skin needs to be as free of germs as possible. You can reduce the number of germs on your skin by washing with CHG (chlorahexidine gluconate) Soap before surgery.  CHG is an antiseptic cleaner which kills germs and bonds  with the skin to continue killing germs even after washing.    Oral Hygiene is also important to reduce your risk of infection.  Remember - BRUSH YOUR TEETH THE MORNING OF SURGERY WITH YOUR REGULAR TOOTHPASTE  Please do not use if you have an allergy to CHG or antibacterial soaps. If your skin becomes reddened/irritated stop using the CHG.  Do not shave (including legs and underarms) for at least 48 hours prior to first CHG shower. It is OK to shave your face.  Please follow these instructions carefully.   1. Shower the NIGHT BEFORE SURGERY and the MORNING OF SURGERY with CHG.   2. If you chose to wash your hair, wash your hair first as usual with your normal shampoo.  3. After you shampoo, rinse your hair and body thoroughly to remove the shampoo.  4. Use CHG as you would any other liquid soap. You can apply CHG directly to the skin and wash gently with a scrungie or a clean washcloth.   5. Apply the CHG Soap to your body ONLY FROM THE NECK DOWN.  Do not use on open wounds or open sores. Avoid contact with your eyes, ears, mouth and genitals (private parts). Wash Face and genitals (private parts)  with your normal soap.  6. Wash thoroughly, paying special attention to the area where your surgery will be performed.  7. Thoroughly rinse your body with warm water from the neck down.  8. DO NOT  shower/wash with your normal soap after using and rinsing off the CHG Soap.  9. Pat yourself dry with a CLEAN TOWEL.  10. Wear CLEAN PAJAMAS to bed the night before surgery, wear comfortable clothes the morning of surgery  11. Place CLEAN SHEETS on your bed the night of your first shower and DO NOT SLEEP WITH PETS.  Day of Surgery:  Do not apply any deodorants/lotions.  Please wear clean clothes to the hospital/surgery center.   Remember to brush your teeth WITH YOUR REGULAR TOOTHPASTE.  Please read over the following fact sheets that you were given. Pain Booklet, Coughing and Deep Breathing  and Surgical Site Infection Prevention

## 2017-08-24 ENCOUNTER — Other Ambulatory Visit: Payer: Self-pay

## 2017-08-24 ENCOUNTER — Encounter (HOSPITAL_COMMUNITY): Payer: Self-pay

## 2017-08-24 ENCOUNTER — Encounter (HOSPITAL_COMMUNITY)
Admission: RE | Admit: 2017-08-24 | Discharge: 2017-08-24 | Disposition: A | Discharge status: Home / Self Care | Payer: 59 | Source: Ambulatory Visit | Attending: Surgery | Admitting: Surgery

## 2017-08-24 DIAGNOSIS — Z01812 Encounter for preprocedural laboratory examination: Secondary | ICD-10-CM | POA: Insufficient documentation

## 2017-08-24 LAB — CBC
HCT: 39.1 % (ref 36.0–46.0)
Hemoglobin: 11.9 g/dL — ABNORMAL LOW (ref 12.0–15.0)
MCH: 25.4 pg — ABNORMAL LOW (ref 26.0–34.0)
MCHC: 30.4 g/dL (ref 30.0–36.0)
MCV: 83.5 fL (ref 78.0–100.0)
Platelets: 373 10*3/uL (ref 150–400)
RBC: 4.68 MIL/uL (ref 3.87–5.11)
RDW: 14 % (ref 11.5–15.5)
WBC: 9.9 10*3/uL (ref 4.0–10.5)

## 2017-08-24 LAB — BASIC METABOLIC PANEL
Anion gap: 9 (ref 5–15)
BUN: 11 mg/dL (ref 8–23)
CO2: 25 mmol/L (ref 22–32)
Calcium: 9.9 mg/dL (ref 8.9–10.3)
Chloride: 106 mmol/L (ref 98–111)
Creatinine, Ser: 0.8 mg/dL (ref 0.44–1.00)
GFR calc Af Amer: >60 mL/min (ref >60)
GFR calc non Af Amer: >60 mL/min (ref >60)
Glucose, Bld: 87 mg/dL (ref 70–99)
Potassium: 3.8 mmol/L (ref 3.5–5.1)
Sodium: 140 mmol/L (ref 135–145)

## 2017-08-24 NOTE — Progress Notes (Signed)
PCP - Dr. [name]  Cardiologist - Denies  Chest x-ray - Denies  EKG - Denies  Stress Test - Denies  ECHO - Denis  Cardiac Cath - Denies  Sleep Study - Denies CPAP - None  LABS- 08/24/17: CBC, BMP  ASA- Denies  Anesthesia- No  Pt denies having chest pain, sob, or fever at this time. All instructions explained to the pt, with a verbal understanding of the material. Pt agrees to go over the instructions while at home for a better understanding. The opportunity to ask questions was provided.

## 2017-08-28 ENCOUNTER — Inpatient Hospital Stay: Payer: 59 | Attending: Oncology

## 2017-08-28 ENCOUNTER — Ambulatory Visit (HOSPITAL_COMMUNITY)
Admission: RE | Admit: 2017-08-28 | Discharge: 2017-08-28 | Disposition: A | Discharge status: Home / Self Care | Payer: 59 | Source: Ambulatory Visit | Attending: Oncology | Admitting: Oncology

## 2017-08-28 DIAGNOSIS — C50412 Malignant neoplasm of upper-outer quadrant of left female breast: Secondary | ICD-10-CM | POA: Diagnosis present

## 2017-08-28 DIAGNOSIS — Z170 Estrogen receptor positive status [ER+]: Secondary | ICD-10-CM | POA: Insufficient documentation

## 2017-08-28 DIAGNOSIS — R197 Diarrhea, unspecified: Secondary | ICD-10-CM | POA: Insufficient documentation

## 2017-08-28 DIAGNOSIS — C773 Secondary and unspecified malignant neoplasm of axilla and upper limb lymph nodes: Secondary | ICD-10-CM | POA: Insufficient documentation

## 2017-08-28 DIAGNOSIS — Z5112 Encounter for antineoplastic immunotherapy: Secondary | ICD-10-CM | POA: Insufficient documentation

## 2017-08-28 DIAGNOSIS — I071 Rheumatic tricuspid insufficiency: Secondary | ICD-10-CM | POA: Diagnosis not present

## 2017-08-28 NOTE — Progress Notes (Signed)
  Echocardiogram 2D Echocardiogram has been performed.  Madison Hopkins 08/28/2017, 12:06 PM

## 2017-08-28 NOTE — Progress Notes (Signed)
FMLA for son, Sharlyn Odonnel, successfully faxed to [employer] at [phone]. Mailed copy to patient address on file.

## 2017-08-29 ENCOUNTER — Encounter: Payer: Self-pay | Admitting: Oncology

## 2017-08-29 NOTE — Progress Notes (Signed)
Called patient being that we didn't have a chance to meet officially after chemo ed due to her having another appointment directly after.  She states she had a chance to get information from her insurance company regarding where she stands with ded/OOP but since then has had some other test done. Advised her that we can meet to discuss further when she comes on 7/9 to determine whether or not she will need copay assistance and to discuss the grant further. Patient has my contact name and number for any additional financial questions or concerns.

## 2017-08-30 ENCOUNTER — Ambulatory Visit (HOSPITAL_COMMUNITY)
Admission: RE | Admit: 2017-08-30 | Discharge: 2017-08-30 | Disposition: A | Discharge status: Home / Self Care | Payer: 59 | Source: Ambulatory Visit | Attending: Oncology | Admitting: Oncology

## 2017-08-30 ENCOUNTER — Ambulatory Visit: Payer: Self-pay | Admitting: Obstetrics and Gynecology

## 2017-08-30 ENCOUNTER — Encounter (HOSPITAL_COMMUNITY)
Admission: RE | Admit: 2017-08-30 | Discharge: 2017-08-30 | Disposition: A | Discharge status: Home / Self Care | Payer: 59 | Source: Ambulatory Visit | Attending: Oncology | Admitting: Oncology

## 2017-08-30 DIAGNOSIS — C773 Secondary and unspecified malignant neoplasm of axilla and upper limb lymph nodes: Secondary | ICD-10-CM | POA: Diagnosis not present

## 2017-08-30 DIAGNOSIS — Z170 Estrogen receptor positive status [ER+]: Secondary | ICD-10-CM | POA: Insufficient documentation

## 2017-08-30 DIAGNOSIS — I700 Atherosclerosis of aorta: Secondary | ICD-10-CM | POA: Diagnosis not present

## 2017-08-30 DIAGNOSIS — C50412 Malignant neoplasm of upper-outer quadrant of left female breast: Secondary | ICD-10-CM | POA: Diagnosis not present

## 2017-08-30 DIAGNOSIS — C50912 Malignant neoplasm of unspecified site of left female breast: Secondary | ICD-10-CM | POA: Diagnosis not present

## 2017-08-30 DIAGNOSIS — E042 Nontoxic multinodular goiter: Secondary | ICD-10-CM | POA: Diagnosis not present

## 2017-08-30 MED ORDER — TECHNETIUM TC 99M MEDRONATE IV KIT
20.6000 | PACK | Freq: Once | INTRAVENOUS | Status: AC | PRN
  Administered 2017-08-30: 20.6 via INTRAVENOUS

## 2017-08-30 MED ORDER — IOHEXOL 300 MG/ML  SOLN
75.0000 mL | Freq: Once | INTRAMUSCULAR | Status: AC | PRN
  Administered 2017-08-30: 75 mL via INTRAVENOUS

## 2017-08-30 NOTE — Progress Notes (Signed)
FMLA successfully faxed to [place] at [phone]. Patient requested to pick up copy from Ms. Wilma at front desk.

## 2017-09-04 NOTE — Progress Notes (Signed)
[place]  [phone] [phone]     ID: NALINI ALCARAZ DOB: 1955-04-13  MR#: [id number]  IRC#:[id number]  Patient Care Team: Donzetta Kohut as PCP - General (Physician Assistant) [name], MD as Consulting Physician (General Surgery) Harumi Yamin, [name] Dad, MD as Consulting Physician (Oncology) Kyung Rudd, MD as Consulting Physician (Radiation Oncology) [name], [name] All, MD as Consulting Physician (Obstetrics and Gynecology) Gentry Fitz, MD as Consulting Physician (Family Medicine) OTHER MD:  CHIEF COMPLAINT: HER-2 positive breast cancer  CURRENT TREATMENT: Neoadjuvant chemotherapy   HISTORY OF CURRENT ILLNESS: From the original intake note:  DUANA BENEDICT noted a mass in the left axilla sometime in January or February 2019.  She eventually brought her to her gynecologist's attention, and underwent bilateral diagnostic mammography with tomography and left breast ultrasonography at [facility] on 08/04/2017 showing: breast density category B. There is a highly suspicious hypoechoic mass in the left breast at the 2 o'clock upper outer quadrant measuring 2.3 x 1.6 x 2.2 cm, located 2 cm from the nipple. Sonographically, there were 2 enlarged lymph nodes in the left axilla, the largest with cortical thickening measuring 2.5 cm. No evidence of malignancy was seen in the right breast.   Accordingly on 08/07/2017 she proceeded to biopsy of the left breast area and 1 of the lymph nodes in question. The pathology from this procedure showed ([id number]): Invasive ductal carcinoma, grade 3. Metastatic carcinoma was found in one left axillary lymph node. Prognostic indicators significant for: estrogen receptor, 30% positive with weak staining intensity and progesterone receptor, 0% negative. Proliferation marker Ki67 at 80%. HER2 amplified with ratios HER2/CEP17 signals 2.32 and average HER2 copies per cell 6.60  The  patient's subsequent history is as detailed below.  INTERVAL HISTORY: Malessa returns today for follow-up of her HER-2 positive, weakly estrogen receptor positive breast cancer. She is accompanied by her brother and daughter.  Since her last visit here, she underwent a bilateral breast MRI with and without contrast on 08/23/2017, showing 2.5 cm biopsy-proven malignancy within the upper outer left breast with 1.3 x 3.7 x 1.3 cm highly suspicious enhancement extending anteriorly from the medial aspect of this malignancy. The entire area measures 5 cm in AP dimension. At least 3 enlarged left axillary lymph nodes compatible with metastatic disease. No MRI evidence of right breast malignancy.   She also underwent a CT chest with contrast on 08/30/2017, showing solid 2.4 cm outer upper posterior left breast mass, compatible with known primary left breast carcinoma. Bulky left axillary nodal metastases. No additional potential sites of metastatic disease in the chest. Mild multinodular goiter with dominant 1.6 cm upper left thyroid lobe nodule. Recommend correlation with thyroid ultrasound.   On 08/30/2017 she underwent a NM Whole Body Bone Scan, showing no findings specific for metastatic disease to bone. Degenerative appearing radiotracer activity at the knees, elbows, ankles, feet.   Echocardiography 08/28/2017 showed an ejection fraction in the 60-65% range.  She met with our chemotherapy teaching nurse.  Port-A-Cath will be placed tomorrow, 09/06/2017.  She is ready to start chemotherapy the following day.  REVIEW OF SYSTEMS: Marybeth is doing well. She reports being ready to "fight". The patient denies unusual headaches, visual changes, nausea, vomiting, or dizziness. There has been no unusual cough, phlegm production, or pleurisy. This been no change in bowel or bladder habits. The patient denies unexplained fatigue or unexplained weight loss, bleeding, rash, or fever. A detailed review of systems was  otherwise noncontributory.    PAST MEDICAL HISTORY: Past Medical History:  Diagnosis Date  . Abnormal Pap smear of vagina 07/28/2016   LGSIL; colpo 07/2016 atrophic squamous cells; colpo 07/2017 - atypia  . Allergy   . Breast cancer ([place])    Metastatic Left breast  . Elevated hemoglobin A1c 07/28/2016   level - 6.1  . Endometriosis   . Low vitamin D level 07/28/2016   level 24.7  . STD (sexually transmitted disease)   GERD but no ulcers  PAST SURGICAL HISTORY: Past Surgical History:  Procedure Laterality Date  . ABDOMINAL HYSTERECTOMY    . CESAREAN SECTION    . COLON SURGERY    . OOPHORECTOMY    . SMALL INTESTINE SURGERY    . SPINE SURGERY    Hysterectomy with salpingo-oophorectomy, Tonsillectomy, Plantar Fascitis Right Foot Surgery    FAMILY HISTORY Family History  Problem Relation Age of Onset  . Mental illness Mother   . Alcoholism Mother   . Cirrhosis Mother   . Cancer Father   . Lung cancer Father   The patient' father died at age 73 due to lung cancer (heavy smoker). The patient's mother died due to liver cirrhosis (heavy drinker). The patient has 2 brothers and no sisters. There was a paternal 1st cousin with colon cancer diagnosed in the mid 40's, who also had cervical cancer. The mother of this 1st cousin (the patient's paternal aunt) had cancer (the patient's is unsure of what type). There was also a paternal uncle with prostate cancer diagnosed in the 3's. The patient denies a family history of breast or ovarian cancer.     GYNECOLOGIC HISTORY:  No LMP recorded (lmp unknown). Patient has had a hysterectomy. Menarche: 62 years old Age at first live birth: 62 years old She is GXP2.  The patient is status post total hysterectomy with bilateral salpingo-oophorectomy in 1995.  She never used contraception. She notes that she had an estrogen shot one time but no other HRTs.    SOCIAL HISTORY:  The patient works in Therapist, art for the tax department. She is  single. At home is herself and no pets. Her son, [name] is age 33 and lives in [place], [place] as a Musician. The patient's daughter [name] is age 35 and lives in [place] in Therapist, art for [employer]. The patient has 5 grandchildren and 1 great grandchild. She attends [employer].      ADVANCED DIRECTIVES: Not in place; at the 08/16/2017 visit the patient was given the appropriate forms to complete on notarized at her discretion   HEALTH MAINTENANCE: Social History   Tobacco Use  . Smoking status: Never Smoker  . Smokeless tobacco: Never Used  Substance Use Topics  . Alcohol use: Yes    Alcohol/week: 0.0 oz    Comment: occasional  . Drug use: Yes    Types: Marijuana     Colonoscopy: 2009?  PAP: 07/31/2017/ positive for Atypical Squamous cells of uncertain significance.   Bone density: 10/24/2016 showed a T score of -0.7 (normal was (   Allergies  Allergen Reactions  . Penicillins Nausea Only and Other (See Comments)    Has patient had a PCN reaction causing immediate rash, facial/tongue/throat swelling, SOB or lightheadedness with hypotension: No Has patient had a PCN reaction causing severe rash involving mucus membranes or skin necrosis: No Has patient had a PCN reaction that required hospitalization: No Has patient had a PCN reaction occurring within the last 10 years:  Yes--nausea & headache ONLY If all of the above answers are "NO", then may proceed with Cephalosporin use.     Current Outpatient Medications  Medication Sig Dispense Refill  . Artificial Tear Solution (OPTI-TEARS OP) Place 1 drop into both eyes 3 (three) times daily as needed (for dry/irritated eyes.).    Marland Kitchen cetirizine (ZYRTEC) 10 MG tablet Take 10 mg by mouth daily.    . fluticasone (FLONASE) 50 MCG/ACT nasal spray Place 1 spray into both nostrils daily as needed for allergies.    . Misc Natural Products (OSTEO BI-FLEX ADV TRIPLE ST PO) Take 1 tablet by mouth daily after lunch.    . Multiple  Vitamin (MULTIVITAMIN WITH MINERALS) TABS tablet Take 1 tablet by mouth daily after lunch.    . naproxen sodium (ALEVE) 220 MG tablet Take 220 mg by mouth 2 (two) times daily as needed (FOR PAIN.).     Marland Kitchen Nutritional Supplements (IMMUNE ENHANCE) TABS Take 1 tablet by mouth daily after lunch.    . tetrahydrozoline (VISINE) 0.05 % ophthalmic solution Place 1 drop into both eyes 3 (three) times daily as needed (for dry eyes.).     No current facility-administered medications for this visit.     OBJECTIVE: Middle-aged African-American woman in no acute distress  Vitals:   09/05/17 1615  BP: (!) 151/87  Pulse: 72  Resp: 18  Temp: 98.4 F (36.9 C)  SpO2: 100%     Body mass index is 30.72 kg/m.   Wt Readings from Last 3 Encounters:  09/05/17 199 lb 1.6 oz (90.3 kg)  08/24/17 199 lb 11.2 oz (90.6 kg)  08/17/17 197 lb (89.4 kg)      ECOG FS:0 - Asymptomatic  Exam was not repeated today  LAB RESULTS:  CMP     Component Value Date/Time   NA 140 08/24/2017 1335   K 3.8 08/24/2017 1335   CL 106 08/24/2017 1335   CO2 25 08/24/2017 1335   GLUCOSE 87 08/24/2017 1335   BUN 11 08/24/2017 1335   CREATININE 0.80 08/24/2017 1335   CREATININE 0.97 08/16/2017 0804   CALCIUM 9.9 08/24/2017 1335   PROT 8.3 08/16/2017 0804   ALBUMIN 4.2 08/16/2017 0804   AST 19 08/16/2017 0804   ALT 20 08/16/2017 0804   ALKPHOS 118 08/16/2017 0804   BILITOT 0.6 08/16/2017 0804   GFRNONAA >60 08/24/2017 1335   GFRNONAA >60 08/16/2017 0804   GFRAA >60 08/24/2017 1335   GFRAA >60 08/16/2017 0804    No results found for: TOTALPROTELP, ALBUMINELP, A1GS, A2GS, BETS, BETA2SER, GAMS, MSPIKE, SPEI  No results found for: KPAFRELGTCHN, LAMBDASER, KAPLAMBRATIO  Lab Results  Component Value Date   WBC 9.9 08/24/2017   NEUTROABS 4.2 08/16/2017   HGB 11.9 (L) 08/24/2017   HCT 39.1 08/24/2017   MCV 83.5 08/24/2017   PLT 373 08/24/2017    _0 @  No results found for: LABCA2  No components  found for: [id number]  No results for input(s): INR in the last 168 hours.  No results found for: LABCA2  No results found for: RFF638  No results found for: GYK599  No results found for: JTT017  No results found for: CA2729  No components found for: HGQUANT  No results found for: CEA1 / No results found for: CEA1   No results found for: AFPTUMOR  No results found for: CHROMOGRNA  No results found for: PSA1  No visits with results within 3 Day(s) from this visit.  Latest known visit with results is:  Hospital Outpatient Visit on 08/24/2017  Component Date Value Ref Range Status  . Sodium 08/24/2017 140  135 - 145 mmol/L Final  . Potassium 08/24/2017 3.8  3.5 - 5.1 mmol/L Final  . Chloride 08/24/2017 106  98 - 111 mmol/L Final   Please note change in reference range.  . CO2 08/24/2017 25  22 - 32 mmol/L Final  . Glucose, Bld 08/24/2017 87  70 - 99 mg/dL Final   Please note change in reference range.  . BUN 08/24/2017 11  8 - 23 mg/dL Final   Please note change in reference range.  . Creatinine, Ser 08/24/2017 0.80  0.44 - 1.00 mg/dL Final  . Calcium 08/24/2017 9.9  8.9 - 10.3 mg/dL Final  . GFR calc non Af Amer 08/24/2017 >60  >60 mL/min Final  . GFR calc Af Amer 08/24/2017 >60  >60 mL/min Final   Comment: (NOTE) The eGFR has been calculated using the CKD EPI equation. This calculation has not been validated in all clinical situations. eGFR's persistently <60 mL/min signify possible Chronic Kidney Disease.   Georgiann Hahn gap 08/24/2017 9  5 - 15 Final   Performed at [facility], [place] [address], [place], [place] [address]  . WBC 08/24/2017 9.9  4.0 - 10.5 K/uL Final  . RBC 08/24/2017 4.68  3.87 - 5.11 MIL/uL Final  . Hemoglobin 08/24/2017 11.9* 12.0 - 15.0 g/dL Final  . HCT 08/24/2017 39.1  36.0 - 46.0 % Final  . MCV 08/24/2017 83.5  78.0 - 100.0 fL Final  . MCH 08/24/2017 25.4* 26.0 - 34.0 pg Final  . MCHC 08/24/2017 30.4  30.0 - 36.0 g/dL Final  . RDW  08/24/2017 14.0  11.5 - 15.5 % Final  . Platelets 08/24/2017 373  150 - 400 K/uL Final   Performed at [facility], [place] [address], [place], [place] [address]    (this displays the last labs from the last 3 days)  No results found for: TOTALPROTELP, ALBUMINELP, A1GS, A2GS, BETS, BETA2SER, GAMS, MSPIKE, SPEI (this displays SPEP labs)  No results found for: KPAFRELGTCHN, LAMBDASER, KAPLAMBRATIO (kappa/lambda light chains)  No results found for: HGBA, HGBA2QUANT, HGBFQUANT, HGBSQUAN (Hemoglobinopathy evaluation)   No results found for: LDH  No results found for: IRON, TIBC, IRONPCTSAT (Iron and TIBC)  No results found for: FERRITIN  Urinalysis    Component Value Date/Time   BILIRUBINUR n 08/25/2016 1407   KETONESUR trace (5) (A) 08/15/2016 1732   PROTEINUR n 08/25/2016 1407   UROBILINOGEN 0.2 08/25/2016 1407   NITRITE n 08/25/2016 1407   LEUKOCYTESUR Negative 08/25/2016 1407     STUDIES: Ct Chest W Contrast  Result Date: 08/30/2017 CLINICAL DATA:  New diagnosis of left breast cancer. Initial staging evaluation. EXAM: CT CHEST WITH CONTRAST TECHNIQUE: Multidetector CT imaging of the chest was performed during intravenous contrast administration. CONTRAST:  42m OMNIPAQUE IOHEXOL 300 MG/ML  SOLN COMPARISON:  None. FINDINGS: Cardiovascular: Normal heart size. No significant pericardial effusion/thickening. Atherosclerotic nonaneurysmal thoracic aorta. Normal caliber pulmonary arteries. No central pulmonary emboli. Mediastinum/Nodes: Mild multinodular goiter with dominant heterogeneous hypodense 1.6 cm upper left thyroid lobe nodule. Unremarkable esophagus. Multiple bulky left axillary lymph nodes, largest 3.6 cm (series 2/image 26). No right axillary adenopathy. No mediastinal or hilar adenopathy. Lungs/Pleura: No pneumothorax. No pleural effusion. No acute consolidative airspace disease, lung masses or significant pulmonary nodules. Tiny calcified granuloma in the lingula. Small  parenchymal band in the medial left lower lobe compatible with mild postinfectious/postinflammatory scarring. Upper  abdomen: No acute abnormality. Musculoskeletal: No aggressive appearing focal osseous lesions. Mild thoracic spondylosis. Solid 2.4 x 1.8 cm outer upper posterior left breast mass (series 2/image 69). IMPRESSION: 1. Solid 2.4 cm outer upper posterior left breast mass, compatible with known primary left breast carcinoma. 2. Bulky left axillary nodal metastases. 3. No additional potential sites of metastatic disease in the chest. 4. Mild multinodular goiter with dominant 1.6 cm upper left thyroid lobe nodule. Recommend correlation with thyroid ultrasound. This follows ACR consensus guidelines: Managing Incidental Thyroid Nodules Detected on Imaging: White Paper of the ACR Incidental Thyroid Findings Committee. J Am Coll Radiol 2015; 12:143-150.5. Aortic Atherosclerosis (ICD10-I70.0). Electronically Signed   By: Ilona Sorrel M.D.   On: 08/30/2017 15:08   Nm Bone Scan Whole Body  Result Date: 08/30/2017 CLINICAL DATA:  62 year old female with recently diagnosed breast cancer with left axillary nodal metastasis. Bilateral knee pain. EXAM: NUCLEAR MEDICINE WHOLE BODY BONE SCAN TECHNIQUE: Whole body anterior and posterior images were obtained approximately 3 hours after intravenous injection of radiopharmaceutical. RADIOPHARMACEUTICALS:  20.6 mCi Technetium-62mMDP IV COMPARISON:  Chest CT 08/30/2017. FINDINGS: Expected radiotracer activity in both kidneys and the urinary bladder. Mild levoconvex thoracic scoliosis. Homogeneous radiotracer activity throughout the axial skeleton including the skull and pelvis. Scattered moderate and occasionally intense foci of radiotracer activity at both knees, both elbows. Symmetric appearance of moderate increased radiotracer activity at both ankles, great toes. These appear degenerative in nature. No suspicious axial or appendicular skeleton activity identified.  IMPRESSION: 1.  No findings specific for metastatic disease to bone. 2. Degenerative appearing radiotracer activity at the knees, elbows, ankles, feet. Electronically Signed   By: HGenevie AnnM.D.   On: 08/30/2017 17:08   Mr Breast Bilateral W Wo Contrast Inc Cad  Result Date: 08/23/2017 CLINICAL DATA:  62year old female with newly diagnosed LEFT breast cancer and LEFT axillary lymph node metastasis. LABS:  Creatinine was obtained on site at [facility] [address] Results: Creatinine 0.8 mg/dL.  GFR - 92. EXAM: BILATERAL BREAST MRI WITH AND WITHOUT CONTRAST TECHNIQUE: Multiplanar, multisequence MR images of both breasts were obtained prior to and following the intravenous administration of 18 ml of MultiHance. THREE-DIMENSIONAL MR IMAGE RENDERING ON INDEPENDENT WORKSTATION: Three-dimensional MR images were rendered by post-processing of the original MR data on an independent workstation. The three-dimensional MR images were interpreted, and findings are reported in the following complete MRI report for this study. Three dimensional images were evaluated at the independent DynaCad workstation COMPARISON:  08/04/2017 mammogram, ultrasound and prior studies FINDINGS: Breast composition: b. Scattered fibroglandular tissue. Background parenchymal enhancement: Minimal Right breast: No suspicious mass or enhancement identified. A 6 x 10 x 10 mm intraparenchymal lymph node within the deep posterior UPPER RIGHT breast is noted (series 6, image 96). Left breast: A 2.5 x 2.2 x 2.5 cm (transverse x AP x CC) irregular enhancing mass within the UPPER-OUTER LEFT breast with biopsy clip artifact (junction of the middle and posterior thirds) is compatible with biopsy-proven malignancy. There is also abnormal linear persistent enhancement measuring 1.3 x 3.7 x 1.3 cm (transverse x AP x CC) extending anteriorly from the MEDIAL aspect of the biopsy-proven malignancy, highly suspicious for tumor extension. This  abnormal enhancement and biopsy-proven malignancy measures 5 cm in the AP dimension. No other abnormal or suspicious abnormalities within the LEFT breast noted. Lymph nodes: At least 3 enlarged LEFT axillary lymph nodes are noted, the 2 largest measuring 6 cm in greatest diameter. No other abnormal  lymph nodes are noted. Ancillary findings:  None. IMPRESSION: 1. 2.5 cm biopsy-proven malignancy within the UPPER-OUTER LEFT breast with 1.3 x 3.7 x 1.3 cm highly suspicious enhancement extending anteriorly from the MEDIAL aspect of this malignancy. The entire area measures 5 cm in AP dimension. 2. At least 3 enlarged LEFT axillary lymph nodes compatible with metastatic disease. 3. No MR evidence of RIGHT breast malignancy. RECOMMENDATION: Treatment plan BI-RADS CATEGORY  6: Known biopsy-proven malignancy. Electronically Signed   By: Margarette Canada M.D.   On: 08/23/2017 16:21   [place] Axillary Node Core Biopsy Left  Addendum Date: 08/09/2017   ADDENDUM REPORT: 08/08/2017 14:15 ADDENDUM: Pathology revealed GRADE III INVASIVE DUCTAL CARCINOMA of the Left breast, 2 o'clock, (ribbon clip). METASTATIC CARCINOMA of the Left axillary node (spiral clip). This was found to be concordant by Dr. Nolon Nations. Pathology results were discussed with the patient by telephone. The patient reported doing well after the biopsies with tenderness at the sites. Post biopsy instructions and care were reviewed and questions were answered. The patient was encouraged to call [facility] for any additional concerns. The patient was referred to [facility] on August 16, 2017. Pathology results reported by Terie Purser, RN on 08/08/2017. Electronically Signed   By: Nolon Nations M.D.   On: 08/08/2017 14:15   Result Date: 08/09/2017 CLINICAL DATA:  Patient presents for ultrasound-guided core biopsy of mass in the 2 o'clock location of the LEFT  breast and an enlarged LOWER LEFT axillary lymph node. EXAM: ULTRASOUND GUIDED LEFT BREAST CORE NEEDLE BIOPSY ULTRASOUND-GUIDED LEFT AXILLARY CORE NEEDLE BIOPSY COMPARISON:  Previous exam(s). FINDINGS: I met with the patient and we discussed the procedure of ultrasound-guided biopsy, including benefits and alternatives. We discussed the high likelihood of a successful procedure. We discussed the risks of the procedure, including infection, bleeding, tissue injury, clip migration, and inadequate sampling. Informed written consent was given. The usual time-out protocol was performed immediately prior to the procedure. Lesion 1: Ribbon shaped clip, 2 o'clock location of the LEFT breast: Lesion quadrant: UPPER-OUTER QUADRANT LEFT breast.Using sterile technique and 1% Lidocaine as local anesthetic, under direct ultrasound visualization, a 12 gauge spring-loaded device was used to perform biopsy of mass in the 2 o'clock location of the LEFT breast using a LATERAL to MEDIAL approach. At the conclusion of the procedure a ribbon shaped tissue marker clip was deployed into the biopsy cavity. Lesion 2: Spiral shaped clip, LEFT axilla: Lesion QUADRANT: LEFT axilla. Using sterile technique and 1% lidocaine as local anesthetic, under direct ultrasound visualization a 14 gauge spring-loaded device was used to perform biopsy of a mass in the LEFT axilla using a LATERAL to MEDIAL approach. At the conclusion of procedure a spiral shaped HydroMARK clip was deployed into the biopsy cavity. Follow up 2 view mammogram was performed and dictated separately. IMPRESSION: Ultrasound guided biopsy of mass in the 2 o'clock location of the LEFT breast and enlarged LEFT axillary lymph node. No apparent complications. Electronically Signed: By: Nolon Nations M.D. On: 08/07/2017 16:30   Mm Clip Placement Left  Result Date: 08/08/2017 CLINICAL DATA:  Status post ultrasound-guided core biopsy of mass in the 2 o'clock location of the LEFT  breast and an enlarged LEFT axillary lymph node. EXAM: DIAGNOSTIC LEFT MAMMOGRAM POST ULTRASOUND BIOPSY x2 COMPARISON:  Previous exam(s). FINDINGS: Mammographic images were obtained following ultrasound guided biopsy of mass in the 2 o'clock location of the LEFT  breast and placement of a ribbon shaped clip. The clip is identified within the mass in the [place] as expected. Spiral shaped clip is identified in the enlarged LOWER LEFT axillary lymph node. IMPRESSION: Tissue marker clips are in the expected locations after biopsy. Final Assessment: Post Procedure Mammograms for Marker Placement Electronically Signed   By: Nolon Nations M.D.   On: 08/08/2017 08:36   [place] Lt Breast Bx W Loc Dev 1st Lesion Img Bx Spec US Guide  Addendum Date: 08/09/2017   ADDENDUM REPORT: 08/08/2017 14:15 ADDENDUM: Pathology revealed GRADE III INVASIVE DUCTAL CARCINOMA of the Left breast, 2 o'clock, (ribbon clip). METASTATIC CARCINOMA of the Left axillary node (spiral clip). This was found to be concordant by Dr. Nolon Nations. Pathology results were discussed with the patient by telephone. The patient reported doing well after the biopsies with tenderness at the sites. Post biopsy instructions and care were reviewed and questions were answered. The patient was encouraged to call [facility] for any additional concerns. The patient was referred to [facility] on August 16, 2017. Pathology results reported by Terie Purser, RN on 08/08/2017. Electronically Signed   By: Nolon Nations M.D.   On: 08/08/2017 14:15   Result Date: 08/09/2017 CLINICAL DATA:  Patient presents for ultrasound-guided core biopsy of mass in the 2 o'clock location of the LEFT breast and an enlarged LOWER LEFT axillary lymph node. EXAM: ULTRASOUND GUIDED LEFT BREAST CORE NEEDLE BIOPSY ULTRASOUND-GUIDED LEFT AXILLARY CORE NEEDLE BIOPSY COMPARISON:   Previous exam(s). FINDINGS: I met with the patient and we discussed the procedure of ultrasound-guided biopsy, including benefits and alternatives. We discussed the high likelihood of a successful procedure. We discussed the risks of the procedure, including infection, bleeding, tissue injury, clip migration, and inadequate sampling. Informed written consent was given. The usual time-out protocol was performed immediately prior to the procedure. Lesion 1: Ribbon shaped clip, 2 o'clock location of the LEFT breast: Lesion quadrant: UPPER-OUTER QUADRANT LEFT breast.Using sterile technique and 1% Lidocaine as local anesthetic, under direct ultrasound visualization, a 12 gauge spring-loaded device was used to perform biopsy of mass in the 2 o'clock location of the LEFT breast using a LATERAL to MEDIAL approach. At the conclusion of the procedure a ribbon shaped tissue marker clip was deployed into the biopsy cavity. Lesion 2: Spiral shaped clip, LEFT axilla: Lesion QUADRANT: LEFT axilla. Using sterile technique and 1% lidocaine as local anesthetic, under direct ultrasound visualization a 14 gauge spring-loaded device was used to perform biopsy of a mass in the LEFT axilla using a LATERAL to MEDIAL approach. At the conclusion of procedure a spiral shaped HydroMARK clip was deployed into the biopsy cavity. Follow up 2 view mammogram was performed and dictated separately. IMPRESSION: Ultrasound guided biopsy of mass in the 2 o'clock location of the LEFT breast and enlarged LEFT axillary lymph node. No apparent complications. Electronically Signed: By: Nolon Nations M.D. On: 08/07/2017 16:30    ELIGIBLE FOR AVAILABLE RESEARCH PROTOCOL: yes (see Research Studies)  ASSESSMENT: 62 y.o. [place], [place] woman status post left breast upper outer quadrant and left axillary lymph node biopsy 08/07/2017, both positive for a T2 N1, stage IIB invasive ductal carcinoma, grade 3, estrogen receptor weakly positive, progesterone  receptor negative, but HER-2 amplified, with an MIB-1 of 80%  (a) staging chest CT and bone scan 08/30/2017 showed no evidence of metastatic disease  (1) neoadjuvant chemotherapy will consist of  carboplatin, docetaxel, trastuzumab, and Pertuzumab given every 21 days x 6 starting 09/07/2017  (2) trastuzumab will be continued to complete 6 months  (3) definitive surgery to follow  (4) adjuvant radiation to follow surgery  (5) will start antiestrogens at the completion of local therapy  PLAN: I spent approximately 40 minutes with the patient and her family members going over her studies and situation.  We reviewed her MRI and she was able to appreciate the images and took pictures of them.  We reviewed the bone scan and chest CT scan which do not show evidence of metastatic disease.  We reviewed her echocardiogram which shows a good ejection fraction.  We then reviewed the possible toxicity side effects and complications of chemotherapy including the rare case of permanent cardiac toxicity and permanent hair loss.  She is ready to start chemotherapy 09/07/2017.  I then gave her a copy of our supportive therapy route sheet and we went over it in detail so she would understand how to use it.  All her prescriptions have been entered and she was reminded to purchase the Claritin which is not a prescription drug.  She will return in 2 days for her first cycle and then she will return to see me a week later.  I have asked her to keep a diary of symptoms we can best troubleshoot problems with subsequent cycles.  I also asked her to call us if she develops either diarrhea or constipation so it we can help her manage those particular issues.  Once she completes her chemotherapy she will have a repeat breast MRI and proceed to surgery.  I am hopeful of good results.  She will call with any other issues that may develop before the next visit. Matelyn Antonelli, [name] Dad, MD  09/05/17 4:35 PM Medical Oncology and  Hematology [facility] [address] [place], [place] [address] Tel. [phone]    Fax. [phone]  I, Margit Banda am acting as a scribe for Chauncey Cruel, MD.   I, Lurline Del MD, have reviewed the above documentation for accuracy and completeness, and I agree with the above.

## 2017-09-05 ENCOUNTER — Inpatient Hospital Stay (HOSPITAL_BASED_OUTPATIENT_CLINIC_OR_DEPARTMENT_OTHER): Payer: 59 | Admitting: Oncology

## 2017-09-05 VITALS — BP 151/87 | HR 72 | Temp 98.4°F | Resp 18 | Ht 67.5 in | Wt 199.1 lb

## 2017-09-05 DIAGNOSIS — R197 Diarrhea, unspecified: Secondary | ICD-10-CM | POA: Diagnosis not present

## 2017-09-05 DIAGNOSIS — Z170 Estrogen receptor positive status [ER+]: Secondary | ICD-10-CM

## 2017-09-05 DIAGNOSIS — C773 Secondary and unspecified malignant neoplasm of axilla and upper limb lymph nodes: Secondary | ICD-10-CM | POA: Diagnosis not present

## 2017-09-05 DIAGNOSIS — C50412 Malignant neoplasm of upper-outer quadrant of left female breast: Secondary | ICD-10-CM | POA: Diagnosis not present

## 2017-09-05 DIAGNOSIS — Z5112 Encounter for antineoplastic immunotherapy: Secondary | ICD-10-CM | POA: Diagnosis not present

## 2017-09-05 MED ORDER — PROCHLORPERAZINE MALEATE 10 MG PO TABS: 10.0000 mg | ORAL_TABLET | Freq: Four times a day (QID) | ORAL | 1 refills | Status: DC | PRN

## 2017-09-05 MED ORDER — DEXAMETHASONE 4 MG PO TABS: 8.0000 mg | ORAL_TABLET | Freq: Two times a day (BID) | ORAL | 1 refills | Status: DC

## 2017-09-05 MED ORDER — LORAZEPAM 0.5 MG PO TABS: 0.5000 mg | ORAL_TABLET | Freq: Every evening | ORAL | 0 refills | Status: DC | PRN

## 2017-09-05 MED ORDER — LIDOCAINE-PRILOCAINE 2.5-2.5 % EX CREA: TOPICAL_CREAM | CUTANEOUS | 3 refills | Status: DC

## 2017-09-05 NOTE — Anesthesia Preprocedure Evaluation (Addendum)
Anesthesia Evaluation  Patient identified by MRN, date of birth, ID band Patient awake    Reviewed: Allergy & Precautions, NPO status , Patient's Chart, lab work & pertinent test results  History of Anesthesia Complications Negative for: history of anesthetic complications  Airway Mallampati: II  TM Distance: >3 FB Neck ROM: Full    Dental  (+) Dental Advisory Given, Chipped, Missing   Pulmonary neg pulmonary ROS,    breath sounds clear to auscultation       Cardiovascular Exercise Tolerance: Good  Rhythm:Regular Rate:Normal  '19 TTE - Moderate concentric LVH. EF 60% to 65%. Left atrium was mildly dilated.    Neuro/Psych negative neurological ROS  negative psych ROS   GI/Hepatic negative GI ROS, (+)     substance abuse  marijuana use,   Endo/Other  Obesity   Renal/GU negative Renal ROS  negative genitourinary   Musculoskeletal negative musculoskeletal ROS (+)   Abdominal   Peds  Hematology negative hematology ROS (+)   Anesthesia Other Findings Hx STIs  Reproductive/Obstetrics Breast cancer S/p hysterectomy                             Anesthesia Physical Anesthesia Plan  ASA: II  Anesthesia Plan: General   Post-op Pain Management:    Induction:   PONV Risk Score and Plan: 3 and Treatment may vary due to age or medical condition, Ondansetron, Dexamethasone and Midazolam  Airway Management Planned: LMA  Additional Equipment: None  Intra-op Plan:   Post-operative Plan: Extubation in OR  Informed Consent: I have reviewed the patients History and Physical, chart, labs and discussed the procedure including the risks, benefits and alternatives for the proposed anesthesia with the patient or authorized representative who has indicated his/her understanding and acceptance.   Dental advisory given  Plan Discussed with: CRNA and Anesthesiologist  Anesthesia Plan Comments:          Anesthesia Quick Evaluation

## 2017-09-05 NOTE — H&P (Addendum)
Madison Hopkins  Location: [facility] Surgery Patient #: [id number] DOB: 10/25/1955 Undefined / Language: [name] / Race: Black or African American Female  History of Present Illness   The patient is a 62 year old female who presents with a complaint of breast cancer.  The PCP is [place], [place] [facility])  The patient was referred by Dr. Domenick Bookbinder  GYN - Dr. Sharen Counter  The pateint was seen at the [facility] - Oncology is Drs. [name] and Lisbeth Renshaw  With her are Will Schlafer (brother), Wanza Szumski (SIL), Alanda Amass (son), Andreas Newport (friend) and Danne Baxter (friend).  She was at Dr. Elza Rafter office and showed Dr. Quincy Simmonds a lump in her left axilla. She thought that is was an infection under her left axilla. This prompted an early mammogram. Her last mammogram was less than a year ago. she's had no prior history of breast disease. She has no family history of breast cancer. She has not been on hormonal therapy. She did have a TAH-BSO in 1995.  [facility] - 2.3 cm mass at the 2 o'clock in the left breast, 2 enlarged lymph nodes - in the left axilla Biopsy: Left breast biopsy and left axillary lymph node biopsy - 08/07/2017 ([id number]) - IDC, grade 3, ER - 30%, PR - 0%, Ki67 - 80%, Her2 - POSITIVE, lymph node 1/1 POSITIVE Family history of breast or ovarian cancer: None On hormone therapy: None  I discussed the options for breast cancer treatment with the patient. The patient is at the [facility], which includes medical oncology and radiation oncology. I discussed the surgical options of lumpectomy vs. mastectomy. If mastectomy, there is the possibility of reconstruction. I discussed the options of lymph node biopsy. The treatment plan depends on the pathologic staging of the tumor and the patient's personal wishes. The risks of surgery include, but are not limited to, bleeding,  infection, the need for further surgery, and nerve injury. The patient has been given literature on the treatment of breast cancer. Because she has a positive node that is Her2 positive - she will get neoadjuvant chemotx.  Plan: 1) Neoadjuvant chemotx, 2) Power port placement, 3) MRI, 4) Final surgery dependent on response to chemotx  Past Medical History: 1. TAH-BSO - 1995 2. Small bowel surgery - 1995 - Dr. Bubba Camp 3. Colonoscopy about 9 years ago 4. She has had injections in her back.  Social History: Single Has two children. Son, [place], with her (live in [place], near [place]). Daughter in [place]. With her are Will Califf (brother), Kandi Brusseau (SIL), Alanda Amass (son), Andreas Newport (friend) and Danne Baxter (friend).   She works as a Scientist, water quality for [employer]  Past Surgical History Tawni Pummel, RN; 08/16/2017 7:37 AM) Breast Biopsy  Left. Cesarean Section - Multiple  Foot Surgery  Right. Hysterectomy (not due to cancer) - Complete  Oral Surgery  Tonsillectomy   Diagnostic Studies History Tawni Pummel, RN; 08/16/2017 7:37 AM) Colonoscopy  within last year Mammogram  within last year Pap Smear  >5 years ago  Medication History Tawni Pummel, RN; 08/16/2017 7:38 AM) Medications Reconciled  Social History Tawni Pummel, RN; 08/16/2017 7:37 AM) Alcohol use  Moderate alcohol use. Caffeine use  Coffee. No drug use  Tobacco use  Current some day smoker.  Family History Tawni Pummel, RN; 08/16/2017 7:37 AM) Alcohol Abuse  Mother. Arthritis  Family Members In General. Depression  Mother. Hypertension  Family Members In [place],  Father, Mother. Respiratory Condition  Father.  Pregnancy / Birth History Tawni Pummel, RN; 08/16/2017 7:37 AM) Age at menarche  7 years. Age of menopause  <45 Contraceptive History  Intrauterine device. Gravida  2 Irregular periods  Maternal age  61-20 Para  2  Other Problems Tawni Pummel, RN; 08/16/2017 7:37 AM) Arthritis  Back Pain  Gastroesophageal Reflux Disease  Hemorrhoids  Lump In Breast  Oophorectomy  Bilateral. Transfusion history  Umbilical Hernia Repair     Review of Systems Sunday Spillers Ledford RN; 08/16/2017 7:37 AM) General Present- Appetite Loss and Fatigue. Not Present- Chills, Fever, Night Sweats, Weight Gain and Weight Loss. Skin Present- Rash. Not Present- Change in Wart/Mole, Dryness, Hives, Jaundice, New Lesions, Non-Healing Wounds and Ulcer. HEENT Present- Hoarseness, Seasonal Allergies, Sinus Pain, Sore Throat and Wears glasses/contact lenses. Not Present- Earache, Hearing Loss, Nose Bleed, Oral Ulcers, Ringing in the Ears, Visual Disturbances and Yellow Eyes. Respiratory Not Present- Bloody sputum, Chronic Cough, Difficulty Breathing, Snoring and Wheezing. Breast Present- Breast Mass and Breast Pain. Not Present- Nipple Discharge and Skin Changes. Cardiovascular Not Present- Chest Pain, Difficulty Breathing Lying Down, Leg Cramps, Palpitations, Rapid Heart Rate, Shortness of Breath and Swelling of Extremities. Gastrointestinal Present- Gets full quickly at meals. Not Present- Abdominal Pain, Bloating, Bloody Stool, Change in Bowel Habits, Chronic diarrhea, Constipation, Difficulty Swallowing, Excessive gas, Hemorrhoids, Indigestion, Nausea, Rectal Pain and Vomiting. Female Genitourinary Not Present- Frequency, Nocturia, Painful Urination, Pelvic Pain and Urgency. Musculoskeletal Not Present- Back Pain, Joint Pain, Joint Stiffness, Muscle Pain, Muscle Weakness and Swelling of Extremities. Neurological Not Present- Decreased Memory, Fainting, Headaches, Numbness, Seizures, Tingling, Tremor, Trouble walking and Weakness. Psychiatric Not Present- Anxiety, Bipolar, Change in Sleep Pattern, Depression, Fearful and Frequent crying. Endocrine Not Present- Cold Intolerance, Excessive Hunger, Hair Changes, Heat Intolerance, Hot flashes and New  Diabetes. Hematology Not Present- Blood Thinners, Easy Bruising, Excessive bleeding, Gland problems, HIV and Persistent Infections.  Physical Exam  General: WN AA [place] and generally healthy appearing. Skin: Inspection and palpation of the skin unremarkable.  Eyes: Conjunctivae white, pupils equal. Face, ears, nose, mouth, and throat: Face - normal. Normal ears and nose. Lips and teeth normal.  Neck: Supple. No mass. Trachea midline. No thyroid mass.  Lymph Nodes: No supraclavicular or cervical adenopathy.  She has a 3 to 4 cm mass high in her left axilla, consistent with the postive lymph node.  Lungs: Normal respiratory effort. Clear to auscultation and symmetric breath sounds. Cardiovascular: Regular rate and rythm. Normal auscultation of the heart. No murmur or rub.  Breasts: Right - No mass or nodule. Left - She has a 2 cm mass at the 9 o'clock position of the left breast, about 6 cm off the areola. The breast mass is less impressive than the axillary mass  Abdomen: Soft. No mass. Liver and spleen not palpable. No tenderness. No hernia. Normal bowel sounds.   Long mid line scar. Rectal: Not done.  Musculoskeletal/extremities: Normal gait. Good strength and ROM in upper and lower extremities.   Neurologic: Grossly intact to motor and sensory function.   Psychiatric: Has normal mood and affect. Judgement and insight appear normal.  Assessment & Plan  1.  BREAST CANCER, STAGE 2, LEFT (C50.912)  Story: Left breast biopsy and left axillary lymph node biopsy - 08/07/2017 ([id number]) - IDC, grade 3, ER - 30%, PR - 0%, Ki67 - 80%, Her2 - POSITIVE, 1/1 nodes  Oncology - [name] and Moody  Plan:   1) Neoadjuvant chemotx,   2) Power  port placement, To start chemotx on 7/11 - Dr. Jana Hakim ask the port be accessed.    3) MRI - 08/23/2017 - 1. 2.5 cm biopsy-proven malignancy within the UPPER-OUTER LEFT breast with 1.3 x 3.7 x 1.3 cm highly  suspicious enhancement extending anteriorly from the MEDIAL aspect of this malignancy. The entire area measures 5 cm in AP dimension.  2. At least 3 enlarged LEFT axillary lymph nodes compatible with metastatic disease.      4) Final surgery dependent on response to chemotx  2. She has had injections in her back. 3.  1.6 cm left thyroid nodule on CT scan of chest - 08/30/2017 4.   Bone scan - 08/30/2017 - neg   [name], MD, [facility] Surgery Pager: [phone] Office phone:  [phone]

## 2017-09-06 ENCOUNTER — Encounter: Payer: Self-pay | Admitting: Oncology

## 2017-09-06 ENCOUNTER — Ambulatory Visit (HOSPITAL_COMMUNITY): Payer: 59 | Admitting: Anesthesiology

## 2017-09-06 ENCOUNTER — Ambulatory Visit (HOSPITAL_COMMUNITY): Payer: 59

## 2017-09-06 ENCOUNTER — Other Ambulatory Visit: Payer: Self-pay

## 2017-09-06 ENCOUNTER — Ambulatory Visit (HOSPITAL_COMMUNITY)
Admission: RE | Admit: 2017-09-06 | Discharge: 2017-09-06 | Disposition: A | Discharge status: Home / Self Care | Payer: 59 | Source: Ambulatory Visit | Attending: Surgery | Admitting: Surgery

## 2017-09-06 ENCOUNTER — Encounter (HOSPITAL_COMMUNITY): Payer: Self-pay | Admitting: General Practice

## 2017-09-06 ENCOUNTER — Encounter (HOSPITAL_COMMUNITY)
Admission: RE | Disposition: A | Discharge status: Home / Self Care | Payer: Self-pay | Source: Ambulatory Visit | Attending: Surgery

## 2017-09-06 DIAGNOSIS — Z452 Encounter for adjustment and management of vascular access device: Secondary | ICD-10-CM | POA: Diagnosis not present

## 2017-09-06 DIAGNOSIS — E669 Obesity, unspecified: Secondary | ICD-10-CM | POA: Insufficient documentation

## 2017-09-06 DIAGNOSIS — C50412 Malignant neoplasm of upper-outer quadrant of left female breast: Secondary | ICD-10-CM | POA: Diagnosis not present

## 2017-09-06 DIAGNOSIS — Z170 Estrogen receptor positive status [ER+]: Secondary | ICD-10-CM | POA: Diagnosis not present

## 2017-09-06 DIAGNOSIS — R590 Localized enlarged lymph nodes: Secondary | ICD-10-CM | POA: Insufficient documentation

## 2017-09-06 DIAGNOSIS — Z419 Encounter for procedure for purposes other than remedying health state, unspecified: Secondary | ICD-10-CM

## 2017-09-06 DIAGNOSIS — C50912 Malignant neoplasm of unspecified site of left female breast: Secondary | ICD-10-CM | POA: Diagnosis not present

## 2017-09-06 DIAGNOSIS — Z6830 Body mass index (BMI) 30.0-30.9, adult: Secondary | ICD-10-CM | POA: Insufficient documentation

## 2017-09-06 DIAGNOSIS — F17200 Nicotine dependence, unspecified, uncomplicated: Secondary | ICD-10-CM | POA: Diagnosis not present

## 2017-09-06 DIAGNOSIS — Z95828 Presence of other vascular implants and grafts: Secondary | ICD-10-CM

## 2017-09-06 HISTORY — PX: PORTACATH PLACEMENT: SHX2246

## 2017-09-06 SURGERY — INSERTION, TUNNELED CENTRAL VENOUS DEVICE, WITH PORT
Anesthesia: General | Site: Chest

## 2017-09-06 MED ORDER — CEFAZOLIN SODIUM-DEXTROSE 2-4 GM/100ML-% IV SOLN
2.0000 g | INTRAVENOUS | Status: DC
  Filled 2017-09-06: qty 100

## 2017-09-06 MED ORDER — GABAPENTIN 300 MG PO CAPS
300.0000 mg | ORAL_CAPSULE | ORAL | Status: AC
  Administered 2017-09-06: 300 mg via ORAL
  Filled 2017-09-06: qty 1

## 2017-09-06 MED ORDER — OXYCODONE HCL 5 MG PO TABS
5.0000 mg | ORAL_TABLET | Freq: Once | ORAL | Status: AC | PRN
  Administered 2017-09-06: 5 mg via ORAL

## 2017-09-06 MED ORDER — SUGAMMADEX SODIUM 200 MG/2ML IV SOLN
INTRAVENOUS | Status: AC
  Filled 2017-09-06: qty 2

## 2017-09-06 MED ORDER — ONDANSETRON HCL 4 MG/2ML IJ SOLN
INTRAMUSCULAR | Status: DC | PRN
  Administered 2017-09-06: 4 mg via INTRAVENOUS

## 2017-09-06 MED ORDER — SODIUM CHLORIDE 0.9 % IV SOLN
INTRAVENOUS | Status: AC
  Filled 2017-09-06: qty 1.2

## 2017-09-06 MED ORDER — BUPIVACAINE-EPINEPHRINE (PF) 0.25% -1:200000 IJ SOLN
INTRAMUSCULAR | Status: AC
  Filled 2017-09-06: qty 30

## 2017-09-06 MED ORDER — PROPOFOL 10 MG/ML IV BOLUS
INTRAVENOUS | Status: DC | PRN
  Administered 2017-09-06: 160 mg via INTRAVENOUS

## 2017-09-06 MED ORDER — MIDAZOLAM HCL 5 MG/5ML IJ SOLN
INTRAMUSCULAR | Status: DC | PRN
  Administered 2017-09-06: 2 mg via INTRAVENOUS

## 2017-09-06 MED ORDER — PHENYLEPHRINE HCL 10 MG/ML IJ SOLN
INTRAMUSCULAR | Status: DC | PRN
  Administered 2017-09-06 (×2): 80 ug via INTRAVENOUS
  Administered 2017-09-06: 120 ug via INTRAVENOUS
  Administered 2017-09-06 (×3): 80 ug via INTRAVENOUS

## 2017-09-06 MED ORDER — OXYCODONE HCL 5 MG/5ML PO SOLN: 5.0000 mg | Freq: Once | ORAL | Status: AC | PRN

## 2017-09-06 MED ORDER — FENTANYL CITRATE (PF) 100 MCG/2ML IJ SOLN
INTRAMUSCULAR | Status: DC | PRN
  Administered 2017-09-06: 25 ug via INTRAVENOUS
  Administered 2017-09-06: 50 ug via INTRAVENOUS

## 2017-09-06 MED ORDER — BUPIVACAINE-EPINEPHRINE 0.25% -1:200000 IJ SOLN
INTRAMUSCULAR | Status: DC | PRN
  Administered 2017-09-06: 15 mL

## 2017-09-06 MED ORDER — HEPARIN SODIUM (PORCINE) 5000 UNIT/ML IJ SOLN
INTRAMUSCULAR | Status: DC | PRN
  Administered 2017-09-06: 08:00:00

## 2017-09-06 MED ORDER — 0.9 % SODIUM CHLORIDE (POUR BTL) OPTIME
TOPICAL | Status: DC | PRN
  Administered 2017-09-06: 1000 mL

## 2017-09-06 MED ORDER — PROMETHAZINE HCL 25 MG/ML IJ SOLN: 6.2500 mg | INTRAMUSCULAR | Status: DC | PRN

## 2017-09-06 MED ORDER — DEXAMETHASONE SODIUM PHOSPHATE 10 MG/ML IJ SOLN
INTRAMUSCULAR | Status: DC | PRN
  Administered 2017-09-06: 10 mg via INTRAVENOUS

## 2017-09-06 MED ORDER — PROPOFOL 10 MG/ML IV BOLUS
INTRAVENOUS | Status: AC
  Filled 2017-09-06: qty 20

## 2017-09-06 MED ORDER — CHLORHEXIDINE GLUCONATE CLOTH 2 % EX PADS: 6.0000 | MEDICATED_PAD | Freq: Once | CUTANEOUS | Status: DC

## 2017-09-06 MED ORDER — PHENYLEPHRINE 40 MCG/ML (10ML) SYRINGE FOR IV PUSH (FOR BLOOD PRESSURE SUPPORT)
PREFILLED_SYRINGE | INTRAVENOUS | Status: AC
  Filled 2017-09-06: qty 10

## 2017-09-06 MED ORDER — FENTANYL CITRATE (PF) 100 MCG/2ML IJ SOLN: 25.0000 ug | INTRAMUSCULAR | Status: DC | PRN

## 2017-09-06 MED ORDER — SUCCINYLCHOLINE CHLORIDE 200 MG/10ML IV SOSY
PREFILLED_SYRINGE | INTRAVENOUS | Status: AC
  Filled 2017-09-06: qty 10

## 2017-09-06 MED ORDER — ONDANSETRON HCL 4 MG/2ML IJ SOLN
INTRAMUSCULAR | Status: AC
  Filled 2017-09-06: qty 2

## 2017-09-06 MED ORDER — EPHEDRINE SULFATE 50 MG/ML IJ SOLN
INTRAMUSCULAR | Status: DC | PRN
  Administered 2017-09-06 (×2): 5 mg via INTRAVENOUS

## 2017-09-06 MED ORDER — DEXAMETHASONE SODIUM PHOSPHATE 10 MG/ML IJ SOLN
INTRAMUSCULAR | Status: AC
  Filled 2017-09-06: qty 1

## 2017-09-06 MED ORDER — LACTATED RINGERS IV SOLN
INTRAVENOUS | Status: DC | PRN
  Administered 2017-09-06: 08:00:00 via INTRAVENOUS

## 2017-09-06 MED ORDER — ROCURONIUM BROMIDE 10 MG/ML (PF) SYRINGE
PREFILLED_SYRINGE | INTRAVENOUS | Status: AC
  Filled 2017-09-06: qty 10

## 2017-09-06 MED ORDER — OXYCODONE HCL 5 MG PO TABS
ORAL_TABLET | ORAL | Status: AC
  Filled 2017-09-06: qty 1

## 2017-09-06 MED ORDER — HEPARIN SOD (PORK) LOCK FLUSH 100 UNIT/ML IV SOLN
INTRAVENOUS | Status: DC | PRN
  Administered 2017-09-06: 400 [IU]

## 2017-09-06 MED ORDER — LIDOCAINE HCL (CARDIAC) PF 100 MG/5ML IV SOSY
PREFILLED_SYRINGE | INTRAVENOUS | Status: DC | PRN
  Administered 2017-09-06: 100 mg via INTRAVENOUS

## 2017-09-06 MED ORDER — HEPARIN SOD (PORK) LOCK FLUSH 100 UNIT/ML IV SOLN
INTRAVENOUS | Status: AC
  Filled 2017-09-06: qty 5

## 2017-09-06 MED ORDER — MIDAZOLAM HCL 2 MG/2ML IJ SOLN
INTRAMUSCULAR | Status: AC
  Filled 2017-09-06: qty 2

## 2017-09-06 MED ORDER — HYDROCODONE-ACETAMINOPHEN 5-325 MG PO TABS: 1.0000 | ORAL_TABLET | Freq: Four times a day (QID) | ORAL | 0 refills | Status: DC | PRN

## 2017-09-06 MED ORDER — FENTANYL CITRATE (PF) 250 MCG/5ML IJ SOLN
INTRAMUSCULAR | Status: AC
  Filled 2017-09-06: qty 5

## 2017-09-06 MED ORDER — ACETAMINOPHEN 500 MG PO TABS
1000.0000 mg | ORAL_TABLET | ORAL | Status: AC
  Administered 2017-09-06: 1000 mg via ORAL
  Filled 2017-09-06: qty 2

## 2017-09-06 SURGICAL SUPPLY — 39 items
ADH SKN CLS APL DERMABOND .7 (GAUZE/BANDAGES/DRESSINGS) ×1
APL SKNCLS STERI-STRIP NONHPOA (GAUZE/BANDAGES/DRESSINGS) ×1
BAG DECANTER FOR FLEXI CONT (MISCELLANEOUS) ×2 IMPLANT
BENZOIN TINCTURE PRP APPL 2/3 (GAUZE/BANDAGES/DRESSINGS) ×1 IMPLANT
CHLORAPREP W/TINT 10.5 ML (MISCELLANEOUS) ×2 IMPLANT
COVER SURGICAL LIGHT HANDLE (MISCELLANEOUS) ×2 IMPLANT
COVER TRANSDUCER ULTRASND GEL (DRAPE) IMPLANT
CRADLE DONUT ADULT HEAD (MISCELLANEOUS) ×2 IMPLANT
DERMABOND ADVANCED (GAUZE/BANDAGES/DRESSINGS) ×1
DERMABOND ADVANCED .7 DNX12 (GAUZE/BANDAGES/DRESSINGS) ×1 IMPLANT
DRAPE C-ARM 42X72 X-RAY (DRAPES) ×2 IMPLANT
DRAPE CHEST BREAST 15X10 FENES (DRAPES) ×2 IMPLANT
ELECT CAUTERY BLADE 6.4 (BLADE) ×2 IMPLANT
ELECT REM PT RETURN 9FT ADLT (ELECTROSURGICAL) ×2
ELECTRODE REM PT RTRN 9FT ADLT (ELECTROSURGICAL) ×1 IMPLANT
GAUZE SPONGE 4X4 12PLY STRL (GAUZE/BANDAGES/DRESSINGS) ×2 IMPLANT
GAUZE SPONGE 4X4 16PLY XRAY LF (GAUZE/BANDAGES/DRESSINGS) ×2 IMPLANT
GEL ULTRASOUND 20GR AQUASONIC (MISCELLANEOUS) IMPLANT
GLOVE SURG SIGNA 7.5 PF LTX (GLOVE) ×3 IMPLANT
GOWN STRL REUS W/ TWL LRG LVL3 (GOWN DISPOSABLE) ×1 IMPLANT
GOWN STRL REUS W/ TWL XL LVL3 (GOWN DISPOSABLE) ×1 IMPLANT
GOWN STRL REUS W/TWL LRG LVL3 (GOWN DISPOSABLE) ×2
GOWN STRL REUS W/TWL XL LVL3 (GOWN DISPOSABLE) ×2
INTRODUCER 13FR (INTRODUCER) IMPLANT
KIT BASIN OR (CUSTOM PROCEDURE TRAY) ×2 IMPLANT
KIT PORT POWER 8FR ISP CVUE (Port) ×1 IMPLANT
KIT TURNOVER KIT B (KITS) ×2 IMPLANT
NS IRRIG 1000ML POUR BTL (IV SOLUTION) ×2 IMPLANT
PAD ARMBOARD 7.5X6 YLW CONV (MISCELLANEOUS) ×2 IMPLANT
PENCIL BUTTON HOLSTER BLD 10FT (ELECTRODE) ×2 IMPLANT
SET SHEATH INTRODUCER 10FR (MISCELLANEOUS) IMPLANT
SHEATH COOK PEEL AWAY SET 9F (SHEATH) IMPLANT
STRIP CLOSURE SKIN 1/2X4 (GAUZE/BANDAGES/DRESSINGS) ×1 IMPLANT
SUT MNCRL AB 4-0 PS2 18 (SUTURE) ×2 IMPLANT
SUT VIC AB 3-0 SH 18 (SUTURE) ×2 IMPLANT
SYR 5ML LUER SLIP (SYRINGE) ×2 IMPLANT
TOWEL OR 17X24 6PK STRL BLUE (TOWEL DISPOSABLE) ×2 IMPLANT
TOWEL OR 17X26 10 PK STRL BLUE (TOWEL DISPOSABLE) ×1 IMPLANT
TRAY LAPAROSCOPIC MC (CUSTOM PROCEDURE TRAY) ×2 IMPLANT

## 2017-09-06 NOTE — Transfer of Care (Signed)
Immediate Anesthesia Transfer of Care Note  Patient: Madison Hopkins  Procedure(s) Performed: INSERTION PORT-A-CATH (N/A Chest)  Patient Location: PACU  Anesthesia Type:General  Level of Consciousness: awake, alert , oriented and patient cooperative  Airway & Oxygen Therapy: Patient Spontanous Breathing and Patient connected to face mask oxygen  Post-op Assessment: Report given to RN and Post -op Vital signs reviewed and stable  Post vital signs: Reviewed and stable  Last Vitals:  Vitals Value Taken Time  BP 120/65 09/06/2017  9:37 AM  Temp    Pulse 76 09/06/2017  9:38 AM  Resp 15 09/06/2017  9:38 AM  SpO2 100 % 09/06/2017  9:38 AM  Vitals shown include unvalidated device data.  Last Pain:  Vitals:   09/06/17 0732  TempSrc: Oral  PainSc: 8       Patients Stated Pain Goal: 3 (06/84/03 3533)  Complications: No apparent anesthesia complications

## 2017-09-06 NOTE — Anesthesia Procedure Notes (Signed)
Procedure Name: LMA Insertion Date/Time: 09/06/2017 8:42 AM Performed by: Shirlyn Goltz, CRNA Patient Re-evaluated:Patient Re-evaluated prior to induction Oxygen Delivery Method: Circle system utilized Preoxygenation: Pre-oxygenation with 100% oxygen Induction Type: IV induction Ventilation: Mask ventilation without difficulty LMA: LMA inserted LMA Size: 4.0 Number of attempts: 1 Placement Confirmation: positive ETCO2 and breath sounds checked- equal and bilateral Tube secured with: Tape Dental Injury: Teeth and Oropharynx as per pre-operative assessment

## 2017-09-06 NOTE — Op Note (Signed)
09/06/2017  9:51 AM  PATIENT:  Madison Hopkins, 62 y.o., female MRN: [id number] DOB: 21-Jun-1955  PREOP DIAGNOSIS:  LEFT BREAST CANCER, anticipate chemotherapy  POSTOP DIAGNOSIS:   Left breast cancer, anticipate chemotherapy  PROCEDURE:   Procedure(s): INSERTION Left subclavian power port, left accessed  SURGEON:   [name], M.D.  ANESTHESIA:   general  Anesthesiologist: Audry Pili, MD CRNA: Shirlyn Goltz, CRNA  General  EBL:  minimal  ml  COUNTS CORRECT:  YES  INDICATIONS FOR PROCEDURE:  Madison Hopkins is a 61 y.o. (DOB: 06-02-1955) AA female whose primary care physician is Timmothy Euler, [place], PA-C and comes for power port placement for the treatment of left breast cancer cancer.  Dr. Jana Hakim is her treating oncologist.   The indications and risks of the surgery were explained to the patient.  The risks include, but are not limited to, infection, bleeding, pneumothorax, nerve injury, and thrombosis of the vein.  OPERATIVE NOTE:  The patient was taken to [facility] at [facility].  Anesthesia was provided by Anesthesiologist: Audry Pili, MD CRNA: Shirlyn Goltz, CRNA.  At the beginning of the operation, the patient was given 2 gm Ancef, had a roll placed under her back, and had the upper chest/neck prepped with Chloroprep and draped.   A time out was held and the surgery checklist reviewed.   The patient was placed in Trendelenburg position.  The right subclavian vein was accessed with a 16 gauge needle and a guide wire threaded through the needle into the vein.  The position of the wire was checked with fluoroscopy.   I then developed a pocket in the upper inner aspect of the right chest for the port reservoir.  I used the [employer] for venous access.  The reservoir was sewn in place with a 3-0 Vicryl suture.  The reservoir had been flushed with dilute (10 units/cc) heparin.   I then passed the silastic tubing from the reservoir  incision to the subclavian stick site and used the 8 French introducer to pass it into the vein.  The tip of the silastic catheter was position at the junction of the SVC and the right atrium under fluoroscopy.  The silastic catheter was then attached to the port with the bayonet device.  The entire port and tubing were checked with fluoroscopy and then the port was flushed with dilute heparin.     The wounds were then closed with 3-0 vicryl subcutaneous sutures and the skin closed with a 4-0 Monocryl suture.  The skin was painted with tincture of benzoin and steristripped.  I accessed the port with a right angle Huber needle and flushed the port with 4 cc of concentrated heparin (100 units/cc).  The port was left accessed because of planned chemotx tomorrow.   The patient was transferred to the recovery room in good condition.  The sponge and needle count were correct at the end of the case.  A CXR is ordered for port placement and pending at the time of this note.  [name], MD, [facility] Surgery Pager: [phone] Office phone:  [phone]

## 2017-09-06 NOTE — Interval H&P Note (Signed)
History and Physical Interval Note:  09/06/2017 7:52 AM  Madison Hopkins  has presented today for surgery, with the diagnosis of LEFT BREAST CANCER  The various methods of treatment have been discussed with the patient and family.   Husband at bedside.  Will leave port accessed.  After consideration of risks, benefits and other options for treatment, the patient has consented to  Procedure(s): INSERTION PORT-A-CATH (N/A) as a surgical intervention .  The patient's history has been reviewed, patient examined, no change in status, stable for surgery.  I have reviewed the patient's chart and labs.  Questions were answered to the patient's satisfaction.     Shann Medal

## 2017-09-06 NOTE — Discharge Instructions (Signed)
CENTRAL  SURGERY - DISCHARGE INSTRUCTIONS TO PATIENT  Activity:  Driving - May drive in one or two days, if doing well   Lifting - No lifting more than 15 pounds for 5 days  Wound Care:   Leave the incision dry for two days, then you may shower  Diet:  As tolerated  Follow up appointment:  Call Dr. Pollie Friar office [facility]) at [phone] for an appointment in 2 months.  Medications and dosages:  Resume your home medications.  You have a prescription for:  Vicodin  Call Dr. Lucia Gaskins or his office  [phone]) if you have:  Temperature greater than 100.4,  Persistent nausea and vomiting,  Severe uncontrolled pain,  Redness, tenderness, or signs of infection (pain, swelling, redness, odor or green/yellow discharge around the site),  Difficulty breathing, headache or visual disturbances,  Any other questions or concerns you may have after discharge.  In an emergency, call 911 or go to an Emergency Department at a nearby hospital.

## 2017-09-06 NOTE — Progress Notes (Signed)
Enrolled patient on 09/05/17 in copay assistance for Neulasta through [place] based on OOP net yet met. Patient approved for up to $10,000 leaving her with a $0 copay for the first injection and a $5 copay for each dose afterwards after insurance pays.  Enrolled patient in copay assistance for Herceptin through [place]. Patient approved for $25,000 for a calendar year leaving her with a $25 copay after insurance pays.  Enrolled patient in copay assistance for Perjeta through [place]. Patient approved for $25,000 for a calendar year leaving her with a $25 copay after insurance pays. Patient will receive copies of the approval letters in the mail as well.

## 2017-09-07 ENCOUNTER — Encounter (HOSPITAL_COMMUNITY): Payer: Self-pay | Admitting: Surgery

## 2017-09-07 ENCOUNTER — Inpatient Hospital Stay: Payer: 59

## 2017-09-07 VITALS — BP 139/77 | HR 68 | Temp 98.7°F | Resp 17

## 2017-09-07 DIAGNOSIS — Z5112 Encounter for antineoplastic immunotherapy: Secondary | ICD-10-CM | POA: Diagnosis not present

## 2017-09-07 DIAGNOSIS — Z170 Estrogen receptor positive status [ER+]: Principal | ICD-10-CM

## 2017-09-07 DIAGNOSIS — C50412 Malignant neoplasm of upper-outer quadrant of left female breast: Secondary | ICD-10-CM

## 2017-09-07 MED ORDER — PALONOSETRON HCL INJECTION 0.25 MG/5ML
0.2500 mg | Freq: Once | INTRAVENOUS | Status: AC
  Administered 2017-09-07: 0.25 mg via INTRAVENOUS

## 2017-09-07 MED ORDER — PEGFILGRASTIM 6 MG/0.6ML ~~LOC~~ PSKT
PREFILLED_SYRINGE | SUBCUTANEOUS | Status: AC
  Filled 2017-09-07: qty 0.6

## 2017-09-07 MED ORDER — DIPHENHYDRAMINE HCL 25 MG PO CAPS
ORAL_CAPSULE | ORAL | Status: AC
  Filled 2017-09-07: qty 1

## 2017-09-07 MED ORDER — SODIUM CHLORIDE 0.9 % IV SOLN
Freq: Once | INTRAVENOUS | Status: AC
  Administered 2017-09-07: 13:00:00 via INTRAVENOUS
  Filled 2017-09-07: qty 5

## 2017-09-07 MED ORDER — SODIUM CHLORIDE 0.9 % IV SOLN
Freq: Once | INTRAVENOUS | Status: AC
  Administered 2017-09-07: 09:00:00 via INTRAVENOUS

## 2017-09-07 MED ORDER — SODIUM CHLORIDE 0.9 % IV SOLN
645.5000 mg | Freq: Once | INTRAVENOUS | Status: AC
  Administered 2017-09-07: 650 mg via INTRAVENOUS
  Filled 2017-09-07: qty 65

## 2017-09-07 MED ORDER — ACETAMINOPHEN 325 MG PO TABS
ORAL_TABLET | ORAL | Status: AC
  Filled 2017-09-07: qty 2

## 2017-09-07 MED ORDER — DOCETAXEL CHEMO INJECTION 160 MG/16ML
75.0000 mg/m2 | Freq: Once | INTRAVENOUS | Status: AC
  Administered 2017-09-07: 160 mg via INTRAVENOUS
  Filled 2017-09-07: qty 16

## 2017-09-07 MED ORDER — ACETAMINOPHEN 325 MG PO TABS
650.0000 mg | ORAL_TABLET | Freq: Once | ORAL | Status: AC
  Administered 2017-09-07: 650 mg via ORAL

## 2017-09-07 MED ORDER — SODIUM CHLORIDE 0.9 % IV SOLN
420.0000 mg | Freq: Once | INTRAVENOUS | Status: AC
  Administered 2017-09-07: 420 mg via INTRAVENOUS
  Filled 2017-09-07: qty 14

## 2017-09-07 MED ORDER — PALONOSETRON HCL INJECTION 0.25 MG/5ML
INTRAVENOUS | Status: AC
  Filled 2017-09-07: qty 5

## 2017-09-07 MED ORDER — HEPARIN SOD (PORK) LOCK FLUSH 100 UNIT/ML IV SOLN
500.0000 [IU] | Freq: Once | INTRAVENOUS | Status: AC | PRN
  Administered 2017-09-07: 500 [IU]
  Filled 2017-09-07: qty 5

## 2017-09-07 MED ORDER — DIPHENHYDRAMINE HCL 25 MG PO CAPS
25.0000 mg | ORAL_CAPSULE | Freq: Once | ORAL | Status: AC
  Administered 2017-09-07: 25 mg via ORAL

## 2017-09-07 MED ORDER — SODIUM CHLORIDE 0.9 % IV SOLN
8.0000 mg/kg | Freq: Once | INTRAVENOUS | Status: AC
  Administered 2017-09-07: 714 mg via INTRAVENOUS
  Filled 2017-09-07: qty 34

## 2017-09-07 MED ORDER — SODIUM CHLORIDE 0.9% FLUSH
10.0000 mL | INTRAVENOUS | Status: DC | PRN
  Administered 2017-09-07: 10 mL
  Filled 2017-09-07: qty 10

## 2017-09-07 MED ORDER — PEGFILGRASTIM 6 MG/0.6ML ~~LOC~~ PSKT
6.0000 mg | PREFILLED_SYRINGE | Freq: Once | SUBCUTANEOUS | Status: AC
  Administered 2017-09-07: 6 mg via SUBCUTANEOUS

## 2017-09-07 NOTE — Anesthesia Postprocedure Evaluation (Signed)
Anesthesia Post Note  Patient: Madison Hopkins  Procedure(s) Performed: INSERTION PORT-A-CATH (N/A Chest)     Patient location during evaluation: PACU Anesthesia Type: General Level of consciousness: awake and alert Pain management: pain level controlled Vital Signs Assessment: post-procedure vital signs reviewed and stable Respiratory status: spontaneous breathing, nonlabored ventilation and respiratory function stable Cardiovascular status: blood pressure returned to baseline and stable Postop Assessment: no apparent nausea or vomiting Anesthetic complications: no    Last Vitals:  Vitals:   09/06/17 1007 09/06/17 1024  BP: 134/76 (!) 141/83  Pulse: 71 70  Resp: 12 12  Temp:  36.6 C  SpO2: 100% 100%    Last Pain:  Vitals:   09/06/17 1014  TempSrc:   PainSc: Asleep   Pain Goal: Patients Stated Pain Goal: 3 (09/06/17 1009)               Audry Pili

## 2017-09-07 NOTE — Progress Notes (Signed)
Ok to proceed with labs from 6/27 per Dr. Jana Hakim.

## 2017-09-07 NOTE — Patient Instructions (Addendum)
[facility] Discharge Instructions for Patients Receiving Chemotherapy  Today you received the following chemotherapy agents herceptin/perjeta/taxotere/carboplatin   To help prevent nausea and vomiting after your treatment, we encourage you to take your nausea medication as directed  If you develop nausea and vomiting that is not controlled by your nausea medication, call the clinic.   BELOW ARE SYMPTOMS THAT SHOULD BE REPORTED IMMEDIATELY:  *FEVER GREATER THAN 100.5 F  *CHILLS WITH OR WITHOUT FEVER  NAUSEA AND VOMITING THAT IS NOT CONTROLLED WITH YOUR NAUSEA MEDICATION  *UNUSUAL SHORTNESS OF BREATH  *UNUSUAL BRUISING OR BLEEDING  TENDERNESS IN MOUTH AND THROAT WITH OR WITHOUT PRESENCE OF ULCERS  *URINARY PROBLEMS  *BOWEL PROBLEMS  UNUSUAL RASH Items with * indicate a potential emergency and should be followed up as soon as possible.  Feel free to call the clinic you have any questions or concerns. The clinic phone number is [phone].  Trastuzumab injection for infusion What is this medicine? TRASTUZUMAB (tras TOO zoo mab) is a monoclonal antibody. It is used to treat breast cancer and stomach cancer. This medicine may be used for other purposes; ask your health care provider or pharmacist if you have questions. COMMON BRAND NAME(S): Herceptin What should I tell my health care provider before I take this medicine? They need to know if you have any of these conditions: -heart disease -heart failure -lung or breathing disease, like asthma -an unusual or allergic reaction to trastuzumab, benzyl alcohol, or other medications, foods, dyes, or preservatives -pregnant or trying to get pregnant -breast-feeding How should I use this medicine? This drug is given as an infusion into a vein. It is administered in a hospital or clinic by a specially trained health care professional. Talk to your pediatrician regarding the use of this medicine in children. This  medicine is not approved for use in children. Overdosage: If you think you have taken too much of this medicine contact a poison control center or emergency room at once. NOTE: This medicine is only for you. Do not share this medicine with others. What if I miss a dose? It is important not to miss a dose. Call your doctor or health care professional if you are unable to keep an appointment. What may interact with this medicine? This medicine may interact with the following medications: -certain types of chemotherapy, such as daunorubicin, doxorubicin, epirubicin, and idarubicin This list may not describe all possible interactions. Give your health care provider a list of all the medicines, herbs, non-prescription drugs, or dietary supplements you use. Also tell them if you smoke, drink alcohol, or use illegal drugs. Some items may interact with your medicine. What should I watch for while using this medicine? Visit your doctor for checks on your progress. Report any side effects. Continue your course of treatment even though you feel ill unless your doctor tells you to stop. Call your doctor or health care professional for advice if you get a fever, chills or sore throat, or other symptoms of a cold or flu. Do not treat yourself. Try to avoid being around people who are sick. You may experience fever, chills and shaking during your first infusion. These effects are usually mild and can be treated with other medicines. Report any side effects during the infusion to your health care professional. Fever and chills usually do not happen with later infusions. Do not become pregnant while taking this medicine or for 7 months after stopping it. Women should inform their doctor if they  wish to become pregnant or think they might be pregnant. Women of child-bearing potential will need to have a negative pregnancy test before starting this medicine. There is a potential for serious side effects to an unborn  child. Talk to your health care professional or pharmacist for more information. Do not breast-feed an infant while taking this medicine or for 7 months after stopping it. Women must use effective birth control with this medicine. What side effects may I notice from receiving this medicine? Side effects that you should report to your doctor or health care professional as soon as possible: -allergic reactions like skin rash, itching or hives, swelling of the face, lips, or tongue -chest pain or palpitations -cough -dizziness -feeling faint or lightheaded, falls -fever -general ill feeling or flu-like symptoms -signs of worsening heart failure like breathing problems; swelling in your legs and feet -unusually weak or tired Side effects that usually do not require medical attention (report to your doctor or health care professional if they continue or are bothersome): -bone pain -changes in taste -diarrhea -joint pain -nausea/vomiting -weight loss This list may not describe all possible side effects. Call your doctor for medical advice about side effects. You may report side effects to FDA at 1-800-FDA-1088. Where should I keep my medicine? This drug is given in a hospital or clinic and will not be stored at home. NOTE: This sheet is a summary. It may not cover all possible information. If you have questions about this medicine, talk to your doctor, pharmacist, or health care provider.  2018 Elsevier/Gold Standard (2016-02-09 14:37:52)  Pertuzumab injection What is this medicine? PERTUZUMAB (per TOOZ ue mab) is a monoclonal antibody. It is used to treat breast cancer. This medicine may be used for other purposes; ask your health care provider or pharmacist if you have questions. COMMON BRAND NAME(S): PERJETA What should I tell my health care provider before I take this medicine? They need to know if you have any of these conditions: -heart disease -heart failure -high blood  pressure -history of irregular heart beat -recent or ongoing radiation therapy -an unusual or allergic reaction to pertuzumab, other medicines, foods, dyes, or preservatives -pregnant or trying to get pregnant -breast-feeding How should I use this medicine? This medicine is for infusion into a vein. It is given by a health care professional in a hospital or clinic setting. Talk to your pediatrician regarding the use of this medicine in children. Special care may be needed. Overdosage: If you think you have taken too much of this medicine contact a poison control center or emergency room at once. NOTE: This medicine is only for you. Do not share this medicine with others. What if I miss a dose? It is important not to miss your dose. Call your doctor or health care professional if you are unable to keep an appointment. What may interact with this medicine? Interactions are not expected. Give your health care provider a list of all the medicines, herbs, non-prescription drugs, or dietary supplements you use. Also tell them if you smoke, drink alcohol, or use illegal drugs. Some items may interact with your medicine. This list may not describe all possible interactions. Give your health care provider a list of all the medicines, herbs, non-prescription drugs, or dietary supplements you use. Also tell them if you smoke, drink alcohol, or use illegal drugs. Some items may interact with your medicine. What should I watch for while using this medicine? Your condition will be monitored carefully while you  are receiving this medicine. Report any side effects. Continue your course of treatment even though you feel ill unless your doctor tells you to stop. Do not become pregnant while taking this medicine or for 7 months after stopping it. Women should inform their doctor if they wish to become pregnant or think they might be pregnant. Women of child-bearing potential will need to have a negative pregnancy  test before starting this medicine. There is a potential for serious side effects to an unborn child. Talk to your health care professional or pharmacist for more information. Do not breast-feed an infant while taking this medicine or for 7 months after stopping it. Women must use effective birth control with this medicine. Call your doctor or health care professional for advice if you get a fever, chills or sore throat, or other symptoms of a cold or flu. Do not treat yourself. Try to avoid being around people who are sick. You may experience fever, chills, and headache during the infusion. Report any side effects during the infusion to your health care professional. What side effects may I notice from receiving this medicine? Side effects that you should report to your doctor or health care professional as soon as possible: -breathing problems -chest pain or palpitations -dizziness -feeling faint or lightheaded -fever or chills -skin rash, itching or hives -sore throat -swelling of the face, lips, or tongue -swelling of the legs or ankles -unusually weak or tired Side effects that usually do not require medical attention (report to your doctor or health care professional if they continue or are bothersome): -diarrhea -hair loss -nausea, vomiting -tiredness This list may not describe all possible side effects. Call your doctor for medical advice about side effects. You may report side effects to FDA at 1-800-FDA-1088. Where should I keep my medicine? This drug is given in a hospital or clinic and will not be stored at home. NOTE: This sheet is a summary. It may not cover all possible information. If you have questions about this medicine, talk to your doctor, pharmacist, or health care provider.  2018 Elsevier/Gold Standard (2015-03-19 12:08:50)  Docetaxel injection What is this medicine? DOCETAXEL (doe se TAX el) is a chemotherapy drug. It targets fast dividing cells, like cancer  cells, and causes these cells to die. This medicine is used to treat many types of cancers like breast cancer, certain stomach cancers, head and neck cancer, lung cancer, and prostate cancer. This medicine may be used for other purposes; ask your health care provider or pharmacist if you have questions. COMMON BRAND NAME(S): Docefrez, Taxotere What should I tell my health care provider before I take this medicine? They need to know if you have any of these conditions: -infection (especially a virus infection such as chickenpox, cold sores, or herpes) -liver disease -low blood counts, like low white cell, platelet, or red cell counts -an unusual or allergic reaction to docetaxel, polysorbate 80, other chemotherapy agents, other medicines, foods, dyes, or preservatives -pregnant or trying to get pregnant -breast-feeding How should I use this medicine? This drug is given as an infusion into a vein. It is administered in a hospital or clinic by a specially trained health care professional. Talk to your pediatrician regarding the use of this medicine in children. Special care may be needed. Overdosage: If you think you have taken too much of this medicine contact a poison control center or emergency room at once. NOTE: This medicine is only for you. Do not share this medicine with others.  What if I miss a dose? It is important not to miss your dose. Call your doctor or health care professional if you are unable to keep an appointment. What may interact with this medicine? -cyclosporine -erythromycin -ketoconazole -medicines to increase blood counts like filgrastim, pegfilgrastim, sargramostim -vaccines Talk to your doctor or health care professional before taking any of these medicines: -acetaminophen -aspirin -ibuprofen -ketoprofen -naproxen This list may not describe all possible interactions. Give your health care provider a list of all the medicines, herbs, non-prescription drugs, or  dietary supplements you use. Also tell them if you smoke, drink alcohol, or use illegal drugs. Some items may interact with your medicine. What should I watch for while using this medicine? Your condition will be monitored carefully while you are receiving this medicine. You will need important blood work done while you are taking this medicine. This drug may make you feel generally unwell. This is not uncommon, as chemotherapy can affect healthy cells as well as cancer cells. Report any side effects. Continue your course of treatment even though you feel ill unless your doctor tells you to stop. In some cases, you may be given additional medicines to help with side effects. Follow all directions for their use. Call your doctor or health care professional for advice if you get a fever, chills or sore throat, or other symptoms of a cold or flu. Do not treat yourself. This drug decreases your body's ability to fight infections. Try to avoid being around people who are sick. This medicine may increase your risk to bruise or bleed. Call your doctor or health care professional if you notice any unusual bleeding. This medicine may contain alcohol in the product. You may get drowsy or dizzy. Do not drive, use machinery, or do anything that needs mental alertness until you know how this medicine affects you. Do not stand or sit up quickly, especially if you are an older patient. This reduces the risk of dizzy or fainting spells. Avoid alcoholic drinks. Do not become pregnant while taking this medicine. Women should inform their doctor if they wish to become pregnant or think they might be pregnant. There is a potential for serious side effects to an unborn child. Talk to your health care professional or pharmacist for more information. Do not breast-feed an infant while taking this medicine. What side effects may I notice from receiving this medicine? Side effects that you should report to your doctor or health  care professional as soon as possible: -allergic reactions like skin rash, itching or hives, swelling of the face, lips, or tongue -low blood counts - This drug may decrease the number of white blood cells, red blood cells and platelets. You may be at increased risk for infections and bleeding. -signs of infection - fever or chills, cough, sore throat, pain or difficulty passing urine -signs of decreased platelets or bleeding - bruising, pinpoint red spots on the skin, black, tarry stools, nosebleeds -signs of decreased red blood cells - unusually weak or tired, fainting spells, lightheadedness -breathing problems -fast or irregular heartbeat -low blood pressure -mouth sores -nausea and vomiting -pain, swelling, redness or irritation at the injection site -pain, tingling, numbness in the hands or feet -swelling of the ankle, feet, hands -weight gain Side effects that usually do not require medical attention (report to your doctor or health care professional if they continue or are bothersome): -bone pain -complete hair loss including hair on your head, underarms, pubic hair, eyebrows, and eyelashes -diarrhea -excessive tearing -  changes in the color of fingernails -loosening of the fingernails -nausea -muscle pain -red flush to skin -sweating -weak or tired This list may not describe all possible side effects. Call your doctor for medical advice about side effects. You may report side effects to FDA at 1-800-FDA-1088. Where should I keep my medicine? This drug is given in a hospital or clinic and will not be stored at home. NOTE: This sheet is a summary. It may not cover all possible information. If you have questions about this medicine, talk to your doctor, pharmacist, or health care provider.  2018 Elsevier/Gold Standard (2015-03-19 12:32:56)  Carboplatin injection What is this medicine? CARBOPLATIN (KAR boe pla tin) is a chemotherapy drug. It targets fast dividing cells, like  cancer cells, and causes these cells to die. This medicine is used to treat ovarian cancer and many other cancers. This medicine may be used for other purposes; ask your health care provider or pharmacist if you have questions. COMMON BRAND NAME(S): Paraplatin What should I tell my health care provider before I take this medicine? They need to know if you have any of these conditions: -blood disorders -hearing problems -kidney disease -recent or ongoing radiation therapy -an unusual or allergic reaction to carboplatin, cisplatin, other chemotherapy, other medicines, foods, dyes, or preservatives -pregnant or trying to get pregnant -breast-feeding How should I use this medicine? This drug is usually given as an infusion into a vein. It is administered in a hospital or clinic by a specially trained health care professional. Talk to your pediatrician regarding the use of this medicine in children. Special care may be needed. Overdosage: If you think you have taken too much of this medicine contact a poison control center or emergency room at once. NOTE: This medicine is only for you. Do not share this medicine with others. What if I miss a dose? It is important not to miss a dose. Call your doctor or health care professional if you are unable to keep an appointment. What may interact with this medicine? -medicines for seizures -medicines to increase blood counts like filgrastim, pegfilgrastim, sargramostim -some antibiotics like amikacin, gentamicin, neomycin, streptomycin, tobramycin -vaccines Talk to your doctor or health care professional before taking any of these medicines: -acetaminophen -aspirin -ibuprofen -ketoprofen -naproxen This list may not describe all possible interactions. Give your health care provider a list of all the medicines, herbs, non-prescription drugs, or dietary supplements you use. Also tell them if you smoke, drink alcohol, or use illegal drugs. Some items may  interact with your medicine. What should I watch for while using this medicine? Your condition will be monitored carefully while you are receiving this medicine. You will need important blood work done while you are taking this medicine. This drug may make you feel generally unwell. This is not uncommon, as chemotherapy can affect healthy cells as well as cancer cells. Report any side effects. Continue your course of treatment even though you feel ill unless your doctor tells you to stop. In some cases, you may be given additional medicines to help with side effects. Follow all directions for their use. Call your doctor or health care professional for advice if you get a fever, chills or sore throat, or other symptoms of a cold or flu. Do not treat yourself. This drug decreases your body's ability to fight infections. Try to avoid being around people who are sick. This medicine may increase your risk to bruise or bleed. Call your doctor or health care professional if  you notice any unusual bleeding. Be careful brushing and flossing your teeth or using a toothpick because you may get an infection or bleed more easily. If you have any dental work done, tell your dentist you are receiving this medicine. Avoid taking products that contain aspirin, acetaminophen, ibuprofen, naproxen, or ketoprofen unless instructed by your doctor. These medicines may hide a fever. Do not become pregnant while taking this medicine. Women should inform their doctor if they wish to become pregnant or think they might be pregnant. There is a potential for serious side effects to an unborn child. Talk to your health care professional or pharmacist for more information. Do not breast-feed an infant while taking this medicine. What side effects may I notice from receiving this medicine? Side effects that you should report to your doctor or health care professional as soon as possible: -allergic reactions like skin rash, itching or  hives, swelling of the face, lips, or tongue -signs of infection - fever or chills, cough, sore throat, pain or difficulty passing urine -signs of decreased platelets or bleeding - bruising, pinpoint red spots on the skin, black, tarry stools, nosebleeds -signs of decreased red blood cells - unusually weak or tired, fainting spells, lightheadedness -breathing problems -changes in hearing -changes in vision -chest pain -high blood pressure -low blood counts - This drug may decrease the number of white blood cells, red blood cells and platelets. You may be at increased risk for infections and bleeding. -nausea and vomiting -pain, swelling, redness or irritation at the injection site -pain, tingling, numbness in the hands or feet -problems with balance, talking, walking -trouble passing urine or change in the amount of urine Side effects that usually do not require medical attention (report to your doctor or health care professional if they continue or are bothersome): -hair loss -loss of appetite -metallic taste in the mouth or changes in taste This list may not describe all possible side effects. Call your doctor for medical advice about side effects. You may report side effects to FDA at 1-800-FDA-1088. Where should I keep my medicine? This drug is given in a hospital or clinic and will not be stored at home. NOTE: This sheet is a summary. It may not cover all possible information. If you have questions about this medicine, talk to your doctor, pharmacist, or health care provider.  2018 Elsevier/Gold Standard (2007-05-22 14:38:05)

## 2017-09-07 NOTE — Progress Notes (Signed)
Discharge instructions printed and verbally reviewed. Patient and brother verbalized understanding. All questions answered at this  Time.

## 2017-09-11 ENCOUNTER — Encounter: Payer: 59 | Admitting: Physician Assistant

## 2017-09-12 ENCOUNTER — Telehealth: Payer: Self-pay | Admitting: Oncology

## 2017-09-12 ENCOUNTER — Telehealth: Payer: Self-pay | Admitting: Adult Health

## 2017-09-12 NOTE — Telephone Encounter (Signed)
Faxed medical records to Edrick Kins with [employer] @ [phone]. Release SY#[id number]

## 2017-09-12 NOTE — Telephone Encounter (Signed)
Called patient per 7/16 msg sent by [facility]

## 2017-09-13 ENCOUNTER — Telehealth: Payer: Self-pay | Admitting: *Deleted

## 2017-09-13 MED ORDER — CHOLESTYRAMINE 4 GM/DOSE PO POWD: 4.0000 g | Freq: Two times a day (BID) | ORAL | 2 refills | Status: DC

## 2017-09-13 MED ORDER — LOPERAMIDE HCL 2 MG PO CAPS: 2.0000 mg | ORAL_CAPSULE | ORAL | 0 refills | Status: DC | PRN

## 2017-09-13 NOTE — Telephone Encounter (Signed)
This RN returned VM left on Triage line at 1610 and forwarded to this RN's line at 1637- retrieved at 1730.  Per call Nyx states she is attempting to speak to someone per multiple calls and has not been able to regarding diarrhea.  This RN returned call and discussed her diarrhea concerns with Kiara stating she has had 5 loose watery minimal to moderate amount of stools.  She has not taken any medication for symptom.  Of note she did call after hours and spoke with a [facility] nurse who advised pt to go to the ER with Aram Beecham informing this RN " I would just wait and see Dr Jana Hakim at my scheduled appointment tomorrow ".  This RN informed her per MD - of use of imodium per this office instructions of taking 2 tabs as soon as she obtains medication and then 1 tab post each loose stool .  Also informed her medication for continued diarrhea control - Questran - is being called in to her pharmacy and to begin to use as well.  This RN also apologized for her calls which did not get routed well or answered.  Gaetana verbalized appreciation of call and plan with verbalized understanding.  Medications sent to verified pharmacy,

## 2017-09-13 NOTE — Progress Notes (Signed)
[facility]  [phone] [phone]     ID: Madison Hopkins DOB: 1955/11/05  MR#: [id number]  CSN#:[id number]  Patient Care Team: Madison Hopkins as PCP - General (Physician Assistant) Madison Overall, MD as Consulting Physician (General Surgery) Madison Hopkins, Madison Dad, MD as Consulting Physician (Oncology) Madison Rudd, MD as Consulting Physician (Radiation Oncology) Madison Hopkins, Madison All, MD as Consulting Physician (Obstetrics and Gynecology) Madison Fitz, MD as Consulting Physician (Family Medicine) OTHER MD:  CHIEF COMPLAINT: HER-2 positive breast cancer  CURRENT TREATMENT: Neoadjuvant chemotherapy  INTERVAL HISTORY: Madison Hopkins returns today for follow-up of her HER-2 positive, weakly estrogen receptor positive breast cancer accompanied by her brother, son and daughter. She is receiving neoadjuvant chemotherapy with carboplatin, docetaxel, trastuzumab, and pertuzumab. Today is day 8 cycle 1.   She has significant issues with diarrhea. She tried to aid this with Imodium and Questran. She also has a post nasal drip and sore throat. She has some oral sores. She is trying to stay hydrated, but she says she only drinks about 2-3 8 oz water bottles daily.   On day 1, she did breathing exercises and ate chicken, humus and vegetable soup. She took nausea medication as instructed every 6 hours. She felt good on day 2. She ate yogurt and chicken salad.   On day 3 she received her Neulasta shot, which she did not initially feel any bony aches, but she is feeling this now. On day 4 she drank a green smoothie and chicken quesadillas. She went to church and felt pretty good.   On day 4 she went to work, and she felt pretty good. She stay at work the majority of the day. She ate [employer] sandwiches. Day 5 was also a decent day, and she went to work. She ate spaghetti and watermelon. She also ate oatmeal with blueberries. She did not have any of her  medications this day. The diarrhea started by the end of day 5. She had fairly normal bowel movements up until day 5.   On day 6 and 7 she had more diarrhea. She had 7 episodes of diarrhea on day 7. She took Imodium and Questran. Today, she has had no desire to eat and had one voluminous episode of diarrhea. She took 1 Imodium today.     REVIEW OF SYSTEMS:  She denies unusual headaches, visual changes, vomiting, or dizziness. There has been no unusual cough, phlegm production, or pleurisy. This been no change in bladder habits. She denies unexplained fatigue or unexplained weight loss, bleeding, rash, or fever. A detailed review of systems was otherwise stable.     HISTORY OF CURRENT ILLNESS: From the original intake note:  Madison Hopkins noted a mass in the left axilla sometime in January or February 2019.  She eventually brought her to her gynecologist's attention, and underwent bilateral diagnostic mammography with tomography and left breast ultrasonography at [facility] on 08/04/2017 showing: breast density category B. There is a highly suspicious hypoechoic mass in the left breast at the 2 o'clock upper outer quadrant measuring 2.3 x 1.6 x 2.2 cm, located 2 cm from the nipple. Sonographically, there were 2 enlarged lymph nodes in the left axilla, the largest with cortical thickening measuring 2.5 cm. No evidence of malignancy was seen in the right breast.   Accordingly on 08/07/2017 she proceeded to biopsy of the left breast area and 1 of the lymph nodes in question. The pathology from this procedure showed (  [id number]): Invasive ductal carcinoma, grade 3. Metastatic carcinoma was found in one left axillary lymph node. Prognostic indicators significant for: estrogen receptor, 30% positive with weak staining intensity and progesterone receptor, 0% negative. Proliferation marker Ki67 at 80%. HER2 amplified with ratios HER2/CEP17 signals 2.32 and average HER2 copies per cell 6.60  The  patient's subsequent history is as detailed below.   PAST MEDICAL HISTORY: Past Medical History:  Diagnosis Date  . Abnormal Pap smear of vagina 07/28/2016   LGSIL; colpo 07/2016 atrophic squamous cells; colpo 07/2017 - atypia  . Allergy   . Breast cancer ([place])    Metastatic Left breast  . Elevated hemoglobin A1c 07/28/2016   level - 6.1  . Endometriosis   . Low vitamin D level 07/28/2016   level 24.7  . STD (sexually transmitted disease)   GERD but no ulcers  PAST SURGICAL HISTORY: Past Surgical History:  Procedure Laterality Date  . ABDOMINAL HYSTERECTOMY    . CESAREAN SECTION    . COLON SURGERY    . OOPHORECTOMY    . PORTACATH PLACEMENT N/A 09/06/2017   Procedure: INSERTION PORT-A-CATH;  Surgeon: Madison Overall, MD;  Location: [facility];  Service: General;  Laterality: N/A;  . SMALL INTESTINE SURGERY    . SPINE SURGERY    Hysterectomy with salpingo-oophorectomy, Tonsillectomy, Plantar Fascitis Right Foot Surgery    FAMILY HISTORY Family History  Problem Relation Age of Onset  . Mental illness Mother   . Alcoholism Mother   . Cirrhosis Mother   . Cancer Father   . Lung cancer Father   The patient' father died at age 66 due to lung cancer (heavy smoker). The patient's mother died due to liver cirrhosis (heavy drinker). The patient has 2 brothers and no sisters. There was a paternal 1st cousin with colon cancer diagnosed in the mid 40's, who also had cervical cancer. The mother of this 1st cousin (the patient's paternal aunt) had cancer (the patient's is unsure of what type). There was also a paternal uncle with prostate cancer diagnosed in the 28's. The patient denies a family history of breast or ovarian cancer.     GYNECOLOGIC HISTORY:  No LMP recorded (lmp unknown). Patient has had a hysterectomy. Menarche: 62 years old Age at first live birth: 62 years old She is GXP2.  The patient is status post total hysterectomy with bilateral salpingo-oophorectomy in 1995.  She  never used contraception. She notes that she had an estrogen shot one time but no other HRTs.    SOCIAL HISTORY:  The patient works in Therapist, art for the tax department. She is single. At home is herself and no pets. Her son, [name] is age 44 and lives in [place], [place] as a Musician. The patient's daughter [name] is age 70 and lives in [place] in Therapist, art for [employer]. The patient has 5 grandchildren and 1 great grandchild. She attends [employer].      ADVANCED DIRECTIVES: Not in place; at the 08/16/2017 visit the patient was given the appropriate forms to complete on notarized at her discretion   HEALTH MAINTENANCE: Social History   Tobacco Use  . Smoking status: Never Smoker  . Smokeless tobacco: Never Used  Substance Use Topics  . Alcohol use: Yes    Alcohol/week: 0.0 oz    Comment: occasional  . Drug use: Yes    Types: Marijuana     Colonoscopy: 2009?  PAP: 07/31/2017/ positive for Atypical Squamous cells of uncertain significance.  Bone density: 10/24/2016 showed a T score of -0.7 (normal was (   Allergies  Allergen Reactions  . Penicillins Nausea Only and Other (See Comments)    Has patient had a PCN reaction causing immediate rash, facial/tongue/throat swelling, SOB or lightheadedness with hypotension: No Has patient had a PCN reaction causing severe rash involving mucus membranes or skin necrosis: No Has patient had a PCN reaction that required hospitalization: No Has patient had a PCN reaction occurring within the last 10 years: Yes--nausea & headache ONLY If [facility] of the above answers are "NO", then may proceed with Cephalosporin use.     Current Outpatient Medications  Medication Sig Dispense Refill  . Artificial Tear Solution (OPTI-TEARS OP) Place 1 drop into both eyes 3 (three) times daily as needed (for dry/irritated eyes.).    Marland Kitchen cetirizine (ZYRTEC) 10 MG tablet Take 10 mg by mouth daily.    Marland Kitchen dexamethasone (DECADRON) 4 MG tablet  Take 2 tablets (8 mg total) by mouth 2 (two) times daily. Start the day before Taxotere. Take once the day after, then 2 times a day x 2d. 30 tablet 1  . fluticasone (FLONASE) 50 MCG/ACT nasal spray Place 1 spray into both nostrils daily as needed for allergies.    Marland Kitchen HYDROcodone-acetaminophen (NORCO/VICODIN) 5-325 MG tablet Take 1 tablet by mouth every 6 (six) hours as needed for moderate pain. 10 tablet 0  . lidocaine-prilocaine (EMLA) cream Apply to affected area once 30 g 3  . LORazepam (ATIVAN) 0.5 MG tablet Take 1 tablet (0.5 mg total) by mouth at bedtime as needed (Nausea or vomiting). 30 tablet 0  . Misc Natural Products (OSTEO BI-FLEX ADV TRIPLE ST PO) Take 1 tablet by mouth daily after lunch.    . Multiple Vitamin (MULTIVITAMIN WITH MINERALS) TABS tablet Take 1 tablet by mouth daily after lunch.    . naproxen sodium (ALEVE) 220 MG tablet Take 220 mg by mouth 2 (two) times daily as needed (FOR PAIN.).     Marland Kitchen Nutritional Supplements (IMMUNE ENHANCE) TABS Take 1 tablet by mouth daily after lunch.    . prochlorperazine (COMPAZINE) 10 MG tablet Take 1 tablet (10 mg total) by mouth every 6 (six) hours as needed (Nausea or vomiting). 30 tablet 1  . tetrahydrozoline (VISINE) 0.05 % ophthalmic solution Place 1 drop into both eyes 3 (three) times daily as needed (for dry eyes.).     No current facility-administered medications for this visit.     OBJECTIVE: Middle-aged African-American woman who appears stated age  There were no vitals filed for this visit.   There is no height or weight on file to calculate BMI.   Wt Readings from Last 3 Encounters:  09/06/17 199 lb 1.6 oz (90.3 kg)  09/05/17 199 lb 1.6 oz (90.3 kg)  08/24/17 199 lb 11.2 oz (90.6 kg)      ECOG FS:1 - Symptomatic but completely ambulatory  Sclerae unicteric, EOMs intact Oropharynx shows mild thrush No cervical or supraclavicular adenopathy Lungs no rales or rhonchi Heart regular rate and rhythm Abd soft, nontender,  positive bowel sounds MSK no focal spinal tenderness, no upper extremity lymphedema Neuro: nonfocal, well oriented, appropriate affect Breasts: Deferred   LAB RESULTS:  CMP     Component Value Date/Time   NA 140 08/24/2017 1335   K 3.8 08/24/2017 1335   CL 106 08/24/2017 1335   CO2 25 08/24/2017 1335   GLUCOSE 87 08/24/2017 1335   BUN 11 08/24/2017 1335   CREATININE 0.80 08/24/2017 1335  CREATININE 0.97 08/16/2017 0804   CALCIUM 9.9 08/24/2017 1335   PROT 8.3 08/16/2017 0804   ALBUMIN 4.2 08/16/2017 0804   AST 19 08/16/2017 0804   ALT 20 08/16/2017 0804   ALKPHOS 118 08/16/2017 0804   BILITOT 0.6 08/16/2017 0804   GFRNONAA >60 08/24/2017 1335   GFRNONAA >60 08/16/2017 0804   GFRAA >60 08/24/2017 1335   GFRAA >60 08/16/2017 0804    No results found for: TOTALPROTELP, ALBUMINELP, A1GS, A2GS, BETS, BETA2SER, GAMS, MSPIKE, SPEI  No results found for: KPAFRELGTCHN, LAMBDASER, KAPLAMBRATIO  Lab Results  Component Value Date   WBC 9.9 08/24/2017   NEUTROABS 4.2 08/16/2017   HGB 11.9 (L) 08/24/2017   HCT 39.1 08/24/2017   MCV 83.5 08/24/2017   PLT 373 08/24/2017    '@LASTCHEMISTRY'$ @  No results found for: LABCA2  No components found for: [id number]  No results for input(s): INR in the last 168 hours.  No results found for: LABCA2  No results found for: UDJ497  No results found for: WYO378  No results found for: HYI502  No results found for: CA2729  No components found for: HGQUANT  No results found for: CEA1 / No results found for: CEA1   No results found for: AFPTUMOR  No results found for: CHROMOGRNA  No results found for: PSA1  No visits with results within 3 Day(s) from this visit.  Latest known visit with results is:  Hospital Outpatient Visit on 08/24/2017  Component Date Value Ref Range Status  . Sodium 08/24/2017 140  135 - 145 mmol/L Final  . Potassium 08/24/2017 3.8  3.5 - 5.1 mmol/L Final  . Chloride 08/24/2017 106  98 - 111 mmol/L  Final   Please note change in reference range.  . CO2 08/24/2017 25  22 - 32 mmol/L Final  . Glucose, Bld 08/24/2017 87  70 - 99 mg/dL Final   Please note change in reference range.  . BUN 08/24/2017 11  8 - 23 mg/dL Final   Please note change in reference range.  . Creatinine, Ser 08/24/2017 0.80  0.44 - 1.00 mg/dL Final  . Calcium 08/24/2017 9.9  8.9 - 10.3 mg/dL Final  . GFR calc non Af Amer 08/24/2017 >60  >60 mL/min Final  . GFR calc Af Amer 08/24/2017 >60  >60 mL/min Final   Comment: (NOTE) The eGFR has been calculated using the CKD EPI equation. This calculation has not been validated in [facility] clinical situations. eGFR's persistently <60 mL/min signify possible Chronic Kidney Disease.   Georgiann Hahn gap 08/24/2017 9  5 - 15 Final   Performed at [facility], [place] [address], [place], [place] [address]  . WBC 08/24/2017 9.9  4.0 - 10.5 K/uL Final  . RBC 08/24/2017 4.68  3.87 - 5.11 MIL/uL Final  . Hemoglobin 08/24/2017 11.9* 12.0 - 15.0 g/dL Final  . HCT 08/24/2017 39.1  36.0 - 46.0 % Final  . MCV 08/24/2017 83.5  78.0 - 100.0 fL Final  . MCH 08/24/2017 25.4* 26.0 - 34.0 pg Final  . MCHC 08/24/2017 30.4  30.0 - 36.0 g/dL Final  . RDW 08/24/2017 14.0  11.5 - 15.5 % Final  . Platelets 08/24/2017 373  150 - 400 K/uL Final   Performed at [facility], [place] [address], [place], [place] [address]    (this displays the last labs from the last 3 days)  No results found for: TOTALPROTELP, ALBUMINELP, A1GS, A2GS, BETS, BETA2SER, GAMS, MSPIKE, SPEI (this displays SPEP labs)  No results found for: KPAFRELGTCHN, LAMBDASER, KAPLAMBRATIO (kappa/lambda light chains)  No results found for: HGBA, HGBA2QUANT, HGBFQUANT, HGBSQUAN (Hemoglobinopathy evaluation)   No results found for: LDH  No results found for: IRON, TIBC, IRONPCTSAT (Iron and TIBC)  No results found for: FERRITIN  Urinalysis    Component Value Date/Time   BILIRUBINUR n 08/25/2016 1407   KETONESUR trace (5) (A)  08/15/2016 1732   PROTEINUR n 08/25/2016 1407   UROBILINOGEN 0.2 08/25/2016 1407   NITRITE n 08/25/2016 1407   LEUKOCYTESUR Negative 08/25/2016 1407     STUDIES: Ct Chest W Contrast  Result Date: 08/30/2017 CLINICAL DATA:  New diagnosis of left breast cancer. Initial staging evaluation. EXAM: CT CHEST WITH CONTRAST TECHNIQUE: Multidetector CT imaging of the chest was performed during intravenous contrast administration. CONTRAST:  58m OMNIPAQUE IOHEXOL 300 MG/ML  SOLN COMPARISON:  None. FINDINGS: Cardiovascular: Normal heart size. No significant pericardial effusion/thickening. Atherosclerotic nonaneurysmal thoracic aorta. Normal caliber pulmonary arteries. No central pulmonary emboli. Mediastinum/Nodes: Mild multinodular goiter with dominant heterogeneous hypodense 1.6 cm upper left thyroid lobe nodule. Unremarkable esophagus. Multiple bulky left axillary lymph nodes, largest 3.6 cm (series 2/image 26). No right axillary adenopathy. No mediastinal or hilar adenopathy. Lungs/Pleura: No pneumothorax. No pleural effusion. No acute consolidative airspace disease, lung masses or significant pulmonary nodules. Tiny calcified granuloma in the lingula. Small parenchymal band in the medial left lower lobe compatible with mild postinfectious/postinflammatory scarring. Upper abdomen: No acute abnormality. Musculoskeletal: No aggressive appearing focal osseous lesions. Mild thoracic spondylosis. Solid 2.4 x 1.8 cm outer upper posterior left breast mass (series 2/image 69). IMPRESSION: 1. Solid 2.4 cm outer upper posterior left breast mass, compatible with known primary left breast carcinoma. 2. Bulky left axillary nodal metastases. 3. No additional potential sites of metastatic disease in the chest. 4. Mild multinodular goiter with dominant 1.6 cm upper left thyroid lobe nodule. Recommend correlation with thyroid ultrasound. This follows ACR consensus guidelines: Managing Incidental Thyroid Nodules Detected on  Imaging: White Paper of the ACR Incidental Thyroid Findings Committee. J Am Coll Radiol 2015; 12:143-150.5. Aortic Atherosclerosis (ICD10-I70.0). Electronically Signed   By: JIlona SorrelM.D.   On: 08/30/2017 15:08   Nm Bone Scan Whole Body  Result Date: 08/30/2017 CLINICAL DATA:  62year old female with recently diagnosed breast cancer with left axillary nodal metastasis. Bilateral knee pain. EXAM: NUCLEAR MEDICINE WHOLE BODY BONE SCAN TECHNIQUE: Whole body anterior and posterior images were obtained approximately 3 hours after intravenous injection of radiopharmaceutical. RADIOPHARMACEUTICALS:  20.6 mCi Technetium-952mDP IV COMPARISON:  Chest CT 08/30/2017. FINDINGS: Expected radiotracer activity in both kidneys and the urinary bladder. Mild levoconvex thoracic scoliosis. Homogeneous radiotracer activity throughout the axial skeleton including the skull and pelvis. Scattered moderate and occasionally intense foci of radiotracer activity at both knees, both elbows. Symmetric appearance of moderate increased radiotracer activity at both ankles, great toes. These appear degenerative in nature. No suspicious axial or appendicular skeleton activity identified. IMPRESSION: 1.  No findings specific for metastatic disease to bone. 2. Degenerative appearing radiotracer activity at the knees, elbows, ankles, feet. Electronically Signed   By: H Genevie Ann.D.   On: 08/30/2017 17:08   Mr Breast Bilateral W Wo Contrast Inc Cad  Result Date: 08/23/2017 CLINICAL DATA:  [age]ear old female with newly diagnosed LEFT breast cancer and LEFT axillary lymph node metastasis. LABS:  Creatinine was obtained on site at [facility] [address] Results: Creatinine 0.8 mg/dL.  GFR - 92. EXAM: BILATERAL BREAST MRI WITH AND WITHOUT CONTRAST TECHNIQUE:  Multiplanar, multisequence MR images of both breasts were obtained prior to and following the intravenous administration of 18 ml of MultiHance. THREE-DIMENSIONAL MR IMAGE  RENDERING ON INDEPENDENT WORKSTATION: Three-dimensional MR images were rendered by post-processing of the original MR data on an independent workstation. The three-dimensional MR images were interpreted, and findings are reported in the following complete MRI report for this study. Three dimensional images were evaluated at the independent DynaCad workstation COMPARISON:  08/04/2017 mammogram, ultrasound and prior studies FINDINGS: Breast composition: b. Scattered fibroglandular tissue. Background parenchymal enhancement: Minimal Right breast: No suspicious mass or enhancement identified. A 6 x 10 x 10 mm intraparenchymal lymph node within the deep posterior UPPER RIGHT breast is noted (series 6, image 96). Left breast: A 2.5 x 2.2 x 2.5 cm (transverse x AP x CC) irregular enhancing mass within the UPPER-OUTER LEFT breast with biopsy clip artifact (junction of the middle and posterior thirds) is compatible with biopsy-proven malignancy. There is also abnormal linear persistent enhancement measuring 1.3 x 3.7 x 1.3 cm (transverse x AP x CC) extending anteriorly from the MEDIAL aspect of the biopsy-proven malignancy, highly suspicious for tumor extension. This abnormal enhancement and biopsy-proven malignancy measures 5 cm in the AP dimension. No other abnormal or suspicious abnormalities within the LEFT breast noted. Lymph nodes: At least 3 enlarged LEFT axillary lymph nodes are noted, the 2 largest measuring 6 cm in greatest diameter. No other abnormal lymph nodes are noted. Ancillary findings:  None. IMPRESSION: 1. 2.5 cm biopsy-proven malignancy within the UPPER-OUTER LEFT breast with 1.3 x 3.7 x 1.3 cm highly suspicious enhancement extending anteriorly from the MEDIAL aspect of this malignancy. The entire area measures 5 cm in AP dimension. 2. At least 3 enlarged LEFT axillary lymph nodes compatible with metastatic disease. 3. No MR evidence of RIGHT breast malignancy. RECOMMENDATION: Treatment plan BI-RADS  CATEGORY  6: Known biopsy-proven malignancy. Electronically Signed   By: Margarette Canada M.D.   On: 08/23/2017 16:21   Dg Chest Port 1 View  Result Date: 09/06/2017 CLINICAL DATA:  Porta catheter placement EXAM: PORTABLE CHEST 1 VIEW COMPARISON:  CT scan chest of August 30, 2017 FINDINGS: The patient has undergone placement of Port-A-Cath appliance on the right. The tip of the catheter projects over the midportion of the SVC. The lungs are well-expanded. There is no focal infiltrate. The heart is top-normal in size. The pulmonary vascularity is normal. The mediastinum is normal in width. There is no pleural effusion. The bony thorax is unremarkable. IMPRESSION: No postprocedure complication following porta catheter placement. No acute cardiopulmonary abnormality. Electronically Signed   By: David  Martinique M.D.   On: 09/06/2017 10:13   Dg Fluoro Guide Cv Line-no Report  Result Date: 09/06/2017 Fluoroscopy was utilized by the requesting physician.  No radiographic interpretation.    ELIGIBLE FOR AVAILABLE RESEARCH PROTOCOL: yes (see Research Studies)  ASSESSMENT: 62 y.o. [place], [place] woman status post left breast upper outer quadrant and left axillary lymph node biopsy 08/07/2017, both positive for a T2 N1, stage IIB invasive ductal carcinoma, grade 3, estrogen receptor weakly positive, progesterone receptor negative, but HER-2 amplified, with an MIB-1 of 80%  (a) staging chest CT and bone scan 08/30/2017 showed no evidence of metastatic disease  (1) neoadjuvant chemotherapy will consist of carboplatin, docetaxel, trastuzumab, and Pertuzumab given every 21 days x 6 starting 09/07/2017  (2) trastuzumab will be continued to complete 6 months  (a) echocardiogram 08/28/2017 showed an ejection fraction in the 60-65% range.  (3) definitive surgery to  follow  (4) adjuvant radiation to follow surgery  (5) will start antiestrogens at the completion of local therapy  PLAN: I spent approximately 30 minutes  with Madison Hopkins with more than 50% of that time spent in counseling and coordination of care.  She did moderately well with her first cycle but there were some significant problems.  First I think she does have some thrush and that is contributing to the taste perversion.  Unfortunately the taste problem is not really completely gone to go away until after chemo is done but at least we can improve it by giving her Diflucan and we discussed that today.  I went ahead and placed that prescription for her  She will repeat the Diflucan days 1 through 5 of each subsequent cycle  We then reviewed how to take care of the diarrhea.  She did much better when she started taking Imodium and Questran.  Nevertheless I think it would be prudent to omit Perjeta from the next cycle.  We will try to put it back in on cycle 3.  I am going to try to get her some fluids today because I do believe she is dehydrated.  We discussed how to keep herself hydrated and how to know how much fluid she is taking daily.  We also discussed taking decaf teas and using carbonated water to get around the taste issue  She would like to take Ensure but she is milk intolerant.  I have a call out to our nutritionist to see if she can advises what supplement would be good for her to take given those issues.  Otherwise I think with the small changes she will do better the next cycle.  She understands she will lose her hair soon.  Her son is a hairdresser so he will be able to help her with that.  She knows to call for any other issues that may develop before the next visit.   Once she completes her chemotherapy she will have a repeat breast MRI and proceed to surgery.  I am hopeful of good results.   Sharnee Douglass, Madison Dad, MD  09/13/17 1:59 PM Medical Oncology and Hematology [facility] [address] [place], [place] [address] Tel. [phone]    Fax. [phone]  Alice Rieger, am acting as scribe for Chauncey Cruel MD.  I, Lurline Del MD, have reviewed the above documentation for accuracy and completeness, and I agree with the above.

## 2017-09-13 NOTE — Progress Notes (Signed)
FMLA for daughter, Alveta Quintela, was successfully faxed to [employer] at [phone]. Mailed copy to patient address on file.

## 2017-09-14 ENCOUNTER — Inpatient Hospital Stay: Payer: 59

## 2017-09-14 ENCOUNTER — Other Ambulatory Visit: Payer: Self-pay | Admitting: *Deleted

## 2017-09-14 ENCOUNTER — Encounter: Payer: Self-pay | Admitting: Nutrition

## 2017-09-14 ENCOUNTER — Telehealth: Payer: Self-pay | Admitting: Oncology

## 2017-09-14 ENCOUNTER — Inpatient Hospital Stay (HOSPITAL_BASED_OUTPATIENT_CLINIC_OR_DEPARTMENT_OTHER): Payer: 59 | Admitting: Oncology

## 2017-09-14 VITALS — BP 109/66 | HR 75 | Temp 99.7°F | Resp 18 | Ht 67.5 in | Wt 190.7 lb

## 2017-09-14 DIAGNOSIS — R439 Unspecified disturbances of smell and taste: Secondary | ICD-10-CM | POA: Diagnosis not present

## 2017-09-14 DIAGNOSIS — C50412 Malignant neoplasm of upper-outer quadrant of left female breast: Secondary | ICD-10-CM

## 2017-09-14 DIAGNOSIS — Z170 Estrogen receptor positive status [ER+]: Secondary | ICD-10-CM

## 2017-09-14 DIAGNOSIS — R197 Diarrhea, unspecified: Secondary | ICD-10-CM | POA: Diagnosis not present

## 2017-09-14 DIAGNOSIS — C773 Secondary and unspecified malignant neoplasm of axilla and upper limb lymph nodes: Secondary | ICD-10-CM | POA: Diagnosis not present

## 2017-09-14 DIAGNOSIS — Z5112 Encounter for antineoplastic immunotherapy: Secondary | ICD-10-CM | POA: Diagnosis not present

## 2017-09-14 LAB — CBC WITH DIFFERENTIAL/PLATELET
Basophils Absolute: 0 10*3/uL (ref 0.0–0.1)
Basophils Relative: 0 %
Eosinophils Absolute: 0.2 10*3/uL (ref 0.0–0.5)
Eosinophils Relative: 2 %
HCT: 36.7 % (ref 34.8–46.6)
Hemoglobin: 11.9 g/dL (ref 11.6–15.9)
Lymphocytes Relative: 27 %
Lymphs Abs: 3.3 10*3/uL (ref 0.9–3.3)
MCH: 25.6 pg (ref 25.1–34.0)
MCHC: 32.4 g/dL (ref 31.5–36.0)
MCV: 78.8 fL — ABNORMAL LOW (ref 79.5–101.0)
Monocytes Absolute: 1.8 10*3/uL — ABNORMAL HIGH (ref 0.1–0.9)
Monocytes Relative: 15 %
Neutro Abs: 6.9 10*3/uL — ABNORMAL HIGH (ref 1.5–6.5)
Neutrophils Relative %: 56 %
Platelets: 199 10*3/uL (ref 145–400)
RBC: 4.66 MIL/uL (ref 3.70–5.45)
RDW: 14.1 % (ref 11.2–14.5)
WBC: 12.2 10*3/uL — ABNORMAL HIGH (ref 3.9–10.3)

## 2017-09-14 LAB — COMPREHENSIVE METABOLIC PANEL
ALT: 28 U/L (ref 0–44)
AST: 16 U/L (ref 15–41)
Albumin: 3.6 g/dL (ref 3.5–5.0)
Alkaline Phosphatase: 128 U/L — ABNORMAL HIGH (ref 38–126)
Anion gap: 5 (ref 5–15)
BUN: 16 mg/dL (ref 8–23)
CO2: 29 mmol/L (ref 22–32)
Calcium: 9.4 mg/dL (ref 8.9–10.3)
Chloride: 102 mmol/L (ref 98–111)
Creatinine, Ser: 0.94 mg/dL (ref 0.44–1.00)
GFR calc Af Amer: >60 mL/min (ref >60)
GFR calc non Af Amer: >60 mL/min (ref >60)
Glucose, Bld: 95 mg/dL (ref 70–99)
Potassium: 3.8 mmol/L (ref 3.5–5.1)
Sodium: 136 mmol/L (ref 135–145)
Total Bilirubin: 0.6 mg/dL (ref 0.3–1.2)
Total Protein: 6.8 g/dL (ref 6.5–8.1)

## 2017-09-14 MED ORDER — SODIUM CHLORIDE 0.9 % IV SOLN
1000.0000 mL | Freq: Once | INTRAVENOUS | Status: AC
  Administered 2017-09-14: 1000 mL via INTRAVENOUS

## 2017-09-14 MED ORDER — HEPARIN SOD (PORK) LOCK FLUSH 100 UNIT/ML IV SOLN
500.0000 [IU] | Freq: Once | INTRAVENOUS | Status: AC
  Administered 2017-09-14: 500 [IU] via INTRAVENOUS
  Filled 2017-09-14: qty 5

## 2017-09-14 MED ORDER — SODIUM CHLORIDE 0.9% FLUSH
10.0000 mL | Freq: Once | INTRAVENOUS | Status: AC
  Administered 2017-09-14: 10 mL via INTRAVENOUS
  Filled 2017-09-14: qty 10

## 2017-09-14 MED ORDER — SODIUM CHLORIDE 0.9 % IV SOLN: Freq: Once | INTRAVENOUS | Status: DC

## 2017-09-14 MED ORDER — FLUCONAZOLE 100 MG PO TABS: ORAL_TABLET | ORAL | 0 refills | Status: DC

## 2017-09-14 NOTE — Telephone Encounter (Signed)
Gave avs and calendar ° °

## 2017-09-14 NOTE — Progress Notes (Signed)
Contacted patient at home. Patient concerned that there is lactose in Oral Nutrition Supplements such as Boost and Ensure. Both these products are suitable for lactose intolerance. Patient is appreciative of information.

## 2017-09-14 NOTE — Telephone Encounter (Signed)
Per 7/18 los patient requested afternoon

## 2017-09-14 NOTE — Progress Notes (Signed)
n

## 2017-09-14 NOTE — Patient Instructions (Signed)
Dehydration, Adult Dehydration is when there is not enough fluid or water in your body. This happens when you lose more fluids than you take in. Dehydration can range from mild to very bad. It should be treated right away to keep it from getting very bad. Symptoms of mild dehydration may include:  Thirst.  Dry lips.  Slightly dry mouth.  Dry, warm skin.  Dizziness. Symptoms of moderate dehydration may include:  Very dry mouth.  Muscle cramps.  Dark pee (urine). Pee may be the color of tea.  Your body making less pee.  Your eyes making fewer tears.  Heartbeat that is uneven or faster than normal (palpitations).  Headache.  Light-headedness, especially when you stand up from sitting.  Fainting (syncope). Symptoms of very bad dehydration may include:  Changes in skin, such as: ? Cold and clammy skin. ? Blotchy (mottled) or pale skin. ? Skin that does not quickly return to normal after being lightly pinched and let go (poor skin turgor).  Changes in body fluids, such as: ? Feeling very thirsty. ? Your eyes making fewer tears. ? Not sweating when body temperature is high, such as in hot weather. ? Your body making very little pee.  Changes in vital signs, such as: ? Weak pulse. ? Pulse that is more than 100 beats a minute when you are sitting still. ? Fast breathing. ? Low blood pressure.  Other changes, such as: ? Sunken eyes. ? Cold hands and feet. ? Confusion. ? Lack of energy (lethargy). ? Trouble waking up from sleep. ? Short-term weight loss. ? Unconsciousness. Follow these instructions at home:  If told by your doctor, drink an ORS: ? Make an ORS by using instructions on the package. ? Start by drinking small amounts, about  cup (120 mL) every 5-10 minutes. ? Slowly drink more until you have had the amount that your doctor said to have.  Drink enough clear fluid to keep your pee clear or pale yellow. If you were told to drink an ORS, finish the ORS  first, then start slowly drinking clear fluids. Drink fluids such as: ? Water. Do not drink only water by itself. Doing that can make the salt (sodium) level in your body get too low (hyponatremia). ? Ice chips. ? Fruit juice that you have added water to (diluted). ? Low-calorie sports drinks.  Avoid: ? Alcohol. ? Drinks that have a lot of sugar. These include high-calorie sports drinks, fruit juice that does not have water added, and soda. ? Caffeine. ? Foods that are greasy or have a lot of fat or sugar.  Take over-the-counter and prescription medicines only as told by your doctor.  Do not take salt tablets. Doing that can make the salt level in your body get too high (hypernatremia).  Eat foods that have minerals (electrolytes). Examples include bananas, oranges, potatoes, tomatoes, and spinach.  Keep all follow-up visits as told by your doctor. This is important. Contact a doctor if:  You have belly (abdominal) pain that: ? Gets worse. ? Stays in one area (localizes).  You have a rash.  You have a stiff neck.  You get angry or annoyed more easily than normal (irritability).  You are more sleepy than normal.  You have a harder time waking up than normal.  You feel: ? Weak. ? Dizzy. ? Very thirsty.  You have peed (urinated) only a small amount of very dark pee during 6-8 hours. Get help right away if:  You have symptoms of   very bad dehydration.  You cannot drink fluids without throwing up (vomiting).  Your symptoms get worse with treatment.  You have a fever.  You have a very bad headache.  You are throwing up or having watery poop (diarrhea) and it: ? Gets worse. ? Does not go away.  You have blood or something green (bile) in your throw-up.  You have blood in your poop (stool). This may cause poop to look black and tarry.  You have not peed in 6-8 hours.  You pass out (faint).  Your heart rate when you are sitting still is more than 100 beats a  minute.  You have trouble breathing. This information is not intended to replace advice given to you by your health care provider. Make sure you discuss any questions you have with your health care provider. Document Released: 12/11/2008 Document Revised: 09/04/2015 Document Reviewed: 04/10/2015 [employer] Interactive Patient Education  2018 [employer]  

## 2017-09-25 ENCOUNTER — Telehealth: Payer: Self-pay | Admitting: *Deleted

## 2017-09-25 NOTE — Telephone Encounter (Signed)
Pt left VM stating " I was talking with my family and I know Dr Jana Hakim said he was going to hold the drug that caused the most diarrhea this cycle - but I would rather deal with the diarrhea then the constipation "  Return call number given as [phone].  This RN returned call to above and obtained identified VM - message left to return call - above can be further discussed at scheduled visit prior to treatment this week.

## 2017-09-27 ENCOUNTER — Inpatient Hospital Stay: Payer: 59

## 2017-09-27 ENCOUNTER — Other Ambulatory Visit: Payer: 59

## 2017-09-27 DIAGNOSIS — Z95828 Presence of other vascular implants and grafts: Secondary | ICD-10-CM

## 2017-09-27 DIAGNOSIS — C50412 Malignant neoplasm of upper-outer quadrant of left female breast: Secondary | ICD-10-CM

## 2017-09-27 DIAGNOSIS — Z5112 Encounter for antineoplastic immunotherapy: Secondary | ICD-10-CM | POA: Diagnosis not present

## 2017-09-27 DIAGNOSIS — Z170 Estrogen receptor positive status [ER+]: Principal | ICD-10-CM

## 2017-09-27 LAB — COMPREHENSIVE METABOLIC PANEL
ALT: 24 U/L (ref 0–44)
AST: 14 U/L — ABNORMAL LOW (ref 15–41)
Albumin: 3.8 g/dL (ref 3.5–5.0)
Alkaline Phosphatase: 139 U/L — ABNORMAL HIGH (ref 38–126)
Anion gap: 8 (ref 5–15)
BUN: 7 mg/dL — ABNORMAL LOW (ref 8–23)
CO2: 26 mmol/L (ref 22–32)
Calcium: 9.8 mg/dL (ref 8.9–10.3)
Chloride: 106 mmol/L (ref 98–111)
Creatinine, Ser: 0.82 mg/dL (ref 0.44–1.00)
GFR calc Af Amer: >60 mL/min (ref >60)
GFR calc non Af Amer: >60 mL/min (ref >60)
Glucose, Bld: 133 mg/dL — ABNORMAL HIGH (ref 70–99)
Potassium: 3.6 mmol/L (ref 3.5–5.1)
Sodium: 140 mmol/L (ref 135–145)
Total Bilirubin: 0.6 mg/dL (ref 0.3–1.2)
Total Protein: 7.2 g/dL (ref 6.5–8.1)

## 2017-09-27 LAB — CBC WITH DIFFERENTIAL/PLATELET
Basophils Absolute: 0 10*3/uL (ref 0.0–0.1)
Basophils Relative: 1 %
Eosinophils Absolute: 0.3 10*3/uL (ref 0.0–0.5)
Eosinophils Relative: 5 %
HCT: 32.7 % — ABNORMAL LOW (ref 34.8–46.6)
Hemoglobin: 10.6 g/dL — ABNORMAL LOW (ref 11.6–15.9)
Lymphocytes Relative: 33 %
Lymphs Abs: 2.4 10*3/uL (ref 0.9–3.3)
MCH: 25.8 pg (ref 25.1–34.0)
MCHC: 32.3 g/dL (ref 31.5–36.0)
MCV: 79.9 fL (ref 79.5–101.0)
Monocytes Absolute: 0.5 10*3/uL (ref 0.1–0.9)
Monocytes Relative: 7 %
Neutro Abs: 3.9 10*3/uL (ref 1.5–6.5)
Neutrophils Relative %: 54 %
Platelets: 213 10*3/uL (ref 145–400)
RBC: 4.09 MIL/uL (ref 3.70–5.45)
RDW: 14.6 % — ABNORMAL HIGH (ref 11.2–14.5)
WBC: 7.2 10*3/uL (ref 3.9–10.3)

## 2017-09-27 MED ORDER — SODIUM CHLORIDE 0.9% FLUSH
10.0000 mL | INTRAVENOUS | Status: DC | PRN
  Administered 2017-09-27: 10 mL via INTRAVENOUS
  Filled 2017-09-27: qty 10

## 2017-09-28 ENCOUNTER — Inpatient Hospital Stay: Payer: 59 | Attending: Adult Health | Admitting: Adult Health

## 2017-09-28 ENCOUNTER — Telehealth: Payer: Self-pay | Admitting: Oncology

## 2017-09-28 ENCOUNTER — Inpatient Hospital Stay: Payer: 59

## 2017-09-28 ENCOUNTER — Other Ambulatory Visit: Payer: Self-pay | Admitting: Adult Health

## 2017-09-28 ENCOUNTER — Encounter: Payer: Self-pay | Admitting: Adult Health

## 2017-09-28 DIAGNOSIS — C50412 Malignant neoplasm of upper-outer quadrant of left female breast: Secondary | ICD-10-CM | POA: Diagnosis not present

## 2017-09-28 DIAGNOSIS — Z170 Estrogen receptor positive status [ER+]: Principal | ICD-10-CM

## 2017-09-28 DIAGNOSIS — R110 Nausea: Secondary | ICD-10-CM | POA: Diagnosis not present

## 2017-09-28 DIAGNOSIS — Z5112 Encounter for antineoplastic immunotherapy: Secondary | ICD-10-CM | POA: Insufficient documentation

## 2017-09-28 DIAGNOSIS — D630 Anemia in neoplastic disease: Secondary | ICD-10-CM | POA: Insufficient documentation

## 2017-09-28 DIAGNOSIS — Z5111 Encounter for antineoplastic chemotherapy: Secondary | ICD-10-CM | POA: Diagnosis not present

## 2017-09-28 DIAGNOSIS — B009 Herpesviral infection, unspecified: Secondary | ICD-10-CM | POA: Diagnosis not present

## 2017-09-28 DIAGNOSIS — N898 Other specified noninflammatory disorders of vagina: Secondary | ICD-10-CM | POA: Insufficient documentation

## 2017-09-28 DIAGNOSIS — R197 Diarrhea, unspecified: Secondary | ICD-10-CM | POA: Diagnosis not present

## 2017-09-28 DIAGNOSIS — C773 Secondary and unspecified malignant neoplasm of axilla and upper limb lymph nodes: Secondary | ICD-10-CM

## 2017-09-28 DIAGNOSIS — K644 Residual hemorrhoidal skin tags: Secondary | ICD-10-CM | POA: Diagnosis not present

## 2017-09-28 MED ORDER — PEGFILGRASTIM 6 MG/0.6ML ~~LOC~~ PSKT
6.0000 mg | PREFILLED_SYRINGE | Freq: Once | SUBCUTANEOUS | Status: AC
  Administered 2017-09-28: 6 mg via SUBCUTANEOUS

## 2017-09-28 MED ORDER — SODIUM CHLORIDE 0.9 % IV SOLN
75.0000 mg/m2 | Freq: Once | INTRAVENOUS | Status: AC
  Administered 2017-09-28: 160 mg via INTRAVENOUS
  Filled 2017-09-28: qty 16

## 2017-09-28 MED ORDER — DIPHENHYDRAMINE HCL 25 MG PO CAPS
25.0000 mg | ORAL_CAPSULE | Freq: Once | ORAL | Status: AC
  Administered 2017-09-28: 25 mg via ORAL

## 2017-09-28 MED ORDER — ACETAMINOPHEN 325 MG PO TABS
650.0000 mg | ORAL_TABLET | Freq: Once | ORAL | Status: AC
  Administered 2017-09-28: 650 mg via ORAL

## 2017-09-28 MED ORDER — PALONOSETRON HCL INJECTION 0.25 MG/5ML
INTRAVENOUS | Status: AC
  Filled 2017-09-28: qty 5

## 2017-09-28 MED ORDER — ACETAMINOPHEN 325 MG PO TABS
ORAL_TABLET | ORAL | Status: AC
  Filled 2017-09-28: qty 2

## 2017-09-28 MED ORDER — DIPHENHYDRAMINE HCL 25 MG PO CAPS
ORAL_CAPSULE | ORAL | Status: AC
Start: 2017-09-28 — End: ?
  Filled 2017-09-28: qty 1

## 2017-09-28 MED ORDER — SODIUM CHLORIDE 0.9 % IV SOLN
6.0000 mg/kg | Freq: Once | INTRAVENOUS | Status: AC
  Administered 2017-09-28: 546 mg via INTRAVENOUS
  Filled 2017-09-28: qty 26

## 2017-09-28 MED ORDER — SODIUM CHLORIDE 0.9 % IV SOLN
Freq: Once | INTRAVENOUS | Status: AC
  Administered 2017-09-28: 09:00:00 via INTRAVENOUS
  Filled 2017-09-28: qty 250

## 2017-09-28 MED ORDER — PALONOSETRON HCL INJECTION 0.25 MG/5ML
0.2500 mg | Freq: Once | INTRAVENOUS | Status: AC
  Administered 2017-09-28: 0.25 mg via INTRAVENOUS

## 2017-09-28 MED ORDER — HEPARIN SOD (PORK) LOCK FLUSH 100 UNIT/ML IV SOLN
500.0000 [IU] | Freq: Once | INTRAVENOUS | Status: AC | PRN
Start: 2017-09-28 — End: 2017-09-28
  Administered 2017-09-28: 500 [IU]
  Filled 2017-09-28: qty 5

## 2017-09-28 MED ORDER — DEXAMETHASONE 4 MG PO TABS: 8.0000 mg | ORAL_TABLET | Freq: Two times a day (BID) | ORAL | 1 refills | Status: DC

## 2017-09-28 MED ORDER — FOSAPREPITANT DIMEGLUMINE INJECTION 150 MG
Freq: Once | INTRAVENOUS | Status: AC
  Administered 2017-09-28: 10:00:00 via INTRAVENOUS
  Filled 2017-09-28: qty 5

## 2017-09-28 MED ORDER — SODIUM CHLORIDE 0.9% FLUSH
10.0000 mL | INTRAVENOUS | Status: DC | PRN
  Administered 2017-09-28: 10 mL
  Filled 2017-09-28: qty 10

## 2017-09-28 MED ORDER — PEGFILGRASTIM 6 MG/0.6ML ~~LOC~~ PSKT
PREFILLED_SYRINGE | SUBCUTANEOUS | Status: AC
  Filled 2017-09-28: qty 0.6

## 2017-09-28 MED ORDER — SODIUM CHLORIDE 0.9 % IV SOLN
632.5000 mg | Freq: Once | INTRAVENOUS | Status: AC
  Administered 2017-09-28: 630 mg via INTRAVENOUS
  Filled 2017-09-28: qty 63

## 2017-09-28 NOTE — Telephone Encounter (Signed)
No 8/1 los/orders/referrals.  °

## 2017-09-28 NOTE — Progress Notes (Signed)
Madison Hopkins  [phone] [phone]     ID: Madison Hopkins DOB: Nov 17, 1955  MR#: [id number]  CSN#:[id number]  Patient Care Team: Madison Hopkins as PCP - General (Physician Assistant) Madison Overall, MD as Consulting Physician (General Surgery) Madison Hopkins, Madison Dad, MD as Consulting Physician (Oncology) Madison Rudd, MD as Consulting Physician (Radiation Oncology) Madison Hopkins, Madison All, MD as Consulting Physician (Obstetrics and Gynecology) Madison Fitz, MD as Consulting Physician (Family Medicine) OTHER MD:  CHIEF COMPLAINT: HER-2 positive breast cancer  CURRENT TREATMENT: Neoadjuvant chemotherapy  INTERVAL HISTORY: Madison Hopkins returns today for follow-up of her HER-2 positive, weakly estrogen receptor positive breast cancer accompanied by her brother, son and daughter. She is receiving neoadjuvant chemotherapy with carboplatin, docetaxel, trastuzumab, and pertuzumab. Today is day 1 cycle 2.   REVIEW OF SYSTEMS: Madison Hopkins is doing well today.  She is eating and drinking fluids again.  She doesn't feel as dehydrated.  Her stools have become more formed.  She did have diarrhea more last week, however this has improved with increased fluid intake.  She does know how to manage her diarrhea if it does become a problem.  She is having a daily bowel movement at this point.    She denies peripheral neuropathy.  She denies fevers or chills.  Her mouth is improved.  She says the diflucan helped her thrush.  She has some questions about her premeds. But otherwise she is doing well and is ready to proceed with chemotherapy.    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  She walks in the morning.  She takes off Thursday, Friday, and Monday with chemotherapy.  Occasionally she needs to take off a Tuesday.  This is working well thus far.     HISTORY OF CURRENT ILLNESS: From the original intake note:  Madison Hopkins noted a mass in the left  axilla sometime in January or February 2019.  She eventually brought her to her gynecologist's attention, and underwent bilateral diagnostic mammography with tomography and left breast ultrasonography at [facility] on 08/04/2017 showing: breast density category B. There is a highly suspicious hypoechoic mass in the left breast at the 2 o'clock upper outer quadrant measuring 2.3 x 1.6 x 2.2 cm, located 2 cm from the nipple. Sonographically, there were 2 enlarged lymph nodes in the left axilla, the largest with cortical thickening measuring 2.5 cm. No evidence of malignancy was seen in the right breast.   Accordingly on 08/07/2017 she proceeded to biopsy of the left breast area and 1 of the lymph nodes in question. The pathology from this procedure showed ([id number]): Invasive ductal carcinoma, grade 3. Metastatic carcinoma was found in one left axillary lymph node. Prognostic indicators significant for: estrogen receptor, 30% positive with weak staining intensity and progesterone receptor, 0% negative. Proliferation marker Ki67 at 80%. HER2 amplified with ratios HER2/CEP17 signals 2.32 and average HER2 copies per cell 6.60  The patient's subsequent history is as detailed below.   PAST MEDICAL HISTORY: Past Medical History:  Diagnosis Date  . Abnormal Pap smear of vagina 07/28/2016   LGSIL; colpo 07/2016 atrophic squamous cells; colpo 07/2017 - atypia  . Allergy   . Breast cancer ([place])    Metastatic Left breast  . Elevated hemoglobin A1c 07/28/2016   level - 6.1  . Endometriosis   . Low vitamin D level 07/28/2016   level 24.7  . STD (sexually transmitted disease)   GERD but no ulcers  PAST SURGICAL HISTORY: Past Surgical History:  Procedure Laterality Date  . ABDOMINAL HYSTERECTOMY    . CESAREAN SECTION    . COLON SURGERY    . OOPHORECTOMY    . PORTACATH PLACEMENT N/A 09/06/2017   Procedure: INSERTION PORT-A-CATH;  Surgeon: Madison Overall, MD;  Location: [facility];  Service: General;   Laterality: N/A;  . SMALL INTESTINE SURGERY    . SPINE SURGERY    Hysterectomy with salpingo-oophorectomy, Tonsillectomy, Plantar Fascitis Right Foot Surgery    FAMILY HISTORY Family History  Problem Relation Age of Onset  . Mental illness Mother   . Alcoholism Mother   . Cirrhosis Mother   . Cancer Father   . Lung cancer Father   The patient' father died at age 51 due to lung cancer (heavy smoker). The patient's mother died due to liver cirrhosis (heavy drinker). The patient has 2 brothers and no sisters. There was a paternal 1st cousin with colon cancer diagnosed in the mid 40's, who also had cervical cancer. The mother of this 1st cousin (the patient's paternal aunt) had cancer (the patient's is unsure of what type). There was also a paternal uncle with prostate cancer diagnosed in the 64's. The patient denies a family history of breast or ovarian cancer.     GYNECOLOGIC HISTORY:  No LMP recorded (lmp unknown). Patient has had a hysterectomy. Menarche: 62 years old Age at first live birth: 62 years old She is GXP2.  The patient is status post total hysterectomy with bilateral salpingo-oophorectomy in 1995.  She never used contraception. She notes that she had an estrogen shot one time but no other HRTs.    SOCIAL HISTORY:  The patient works in Therapist, art for the tax department. She is single. At home is herself and no pets. Her son, Madison Hopkins is age 61 and lives in [place], [place] as a Musician. The patient's daughter Madison Hopkins is age 35 and lives in [place] in Therapist, art for [employer]. The patient has 5 grandchildren and 1 great grandchild. She attends [employer].      ADVANCED DIRECTIVES: Not in place; at the 08/16/2017 visit the patient was given the appropriate forms to complete on notarized at her discretion   HEALTH MAINTENANCE: Social History   Tobacco Use  . Smoking status: Never Smoker  . Smokeless tobacco: Never Used  Substance Use Topics  .  Alcohol use: Yes    Alcohol/week: 0.0 oz    Comment: occasional  . Drug use: Yes    Types: Marijuana     Colonoscopy: 2009?  PAP: 07/31/2017/ positive for Atypical Squamous cells of uncertain significance.   Bone density: 10/24/2016 showed a T score of -0.7 (normal was (   Allergies  Allergen Reactions  . Penicillins Nausea Only and Other (See Comments)    Has patient had a PCN reaction causing immediate rash, facial/tongue/throat swelling, SOB or lightheadedness with hypotension: No Has patient had a PCN reaction causing severe rash involving mucus membranes or skin necrosis: No Has patient had a PCN reaction that required hospitalization: No Has patient had a PCN reaction occurring within the last 10 years: Yes--nausea & headache ONLY If Hopkins of the above answers are "NO", then may proceed with Cephalosporin use.     Current Outpatient Medications  Medication Sig Dispense Refill  . cholestyramine (QUESTRAN) 4 GM/DOSE powder Take 1 packet (4 g total) by mouth 2 (two) times daily with a meal. 378 g 2  . dexamethasone (DECADRON) 4 MG  tablet Take 2 tablets (8 mg total) by mouth 2 (two) times daily. Start the day before Taxotere. Take once the day after, then 2 times a day x 2d. 30 tablet 1  . fluconazole (DIFLUCAN) 100 MG tablet Take daily for 5 days starting on chemo day 30 tablet 0  . fluticasone (FLONASE) 50 MCG/ACT nasal spray Place 1 spray into both nostrils daily as needed for allergies.    Marland Kitchen HYDROcodone-acetaminophen (NORCO/VICODIN) 5-325 MG tablet Take 1 tablet by mouth every 6 (six) hours as needed for moderate pain. 10 tablet 0  . lidocaine-prilocaine (EMLA) cream Apply to affected area once 30 g 3  . loperamide (IMODIUM) 2 MG capsule Take 1 capsule (2 mg total) by mouth as needed for diarrhea or loose stools. 30 capsule 0  . LORazepam (ATIVAN) 0.5 MG tablet Take 1 tablet (0.5 mg total) by mouth at bedtime as needed (Nausea or vomiting). 30 tablet 0  . Misc Natural Products  (OSTEO BI-FLEX ADV TRIPLE ST PO) Take 1 tablet by mouth daily after lunch.    . Multiple Vitamin (MULTIVITAMIN WITH MINERALS) TABS tablet Take 1 tablet by mouth daily after lunch.    . naproxen sodium (ALEVE) 220 MG tablet Take 220 mg by mouth 2 (two) times daily as needed (FOR PAIN.).     Marland Kitchen Nutritional Supplements (IMMUNE ENHANCE) TABS Take 1 tablet by mouth daily after lunch.    . prochlorperazine (COMPAZINE) 10 MG tablet Take 1 tablet (10 mg total) by mouth every 6 (six) hours as needed (Nausea or vomiting). 30 tablet 1  . tetrahydrozoline (VISINE) 0.05 % ophthalmic solution Place 1 drop into both eyes 3 (three) times daily as needed (for dry eyes.).    Marland Kitchen Artificial Tear Solution (OPTI-TEARS OP) Place 1 drop into both eyes 3 (three) times daily as needed (for dry/irritated eyes.).     No current facility-administered medications for this visit.     OBJECTIVE:  Vitals:   09/28/17 0823  BP: 136/82  Pulse: 71  Resp: 18  Temp: 98 F (36.7 C)  SpO2: 100%     Body mass index is 29.94 kg/m.   Wt Readings from Last 3 Encounters:  09/28/17 194 lb (88 kg)  09/14/17 190 lb 11.2 oz (86.5 kg)  09/06/17 199 lb 1.6 oz (90.3 kg)  ECOG FS:1 - Symptomatic but completely ambulatory GENERAL: Patient is a well appearing female in no acute distress HEENT:  Sclerae anicteric.  Oropharynx clear and moist. No ulcerations or evidence of oropharyngeal candidiasis. Neck is supple.  NODES:  No cervical, supraclavicular, or axillary lymphadenopathy palpated.  BREAST EXAM:  Deferred. LUNGS:  Clear to auscultation bilaterally.  No wheezes or rhonchi. HEART:  Regular rate and rhythm. No murmur appreciated. ABDOMEN:  Soft, nontender.  Positive, normoactive bowel sounds. No organomegaly palpated. MSK:  No focal spinal tenderness to palpation. Full range of motion bilaterally in the upper extremities. EXTREMITIES:  No peripheral edema.   SKIN:  Clear with no obvious rashes or skin changes. No nail  dyscrasia. NEURO:  Nonfocal. Well oriented.  Appropriate affect.    LAB RESULTS:  CMP     Component Value Date/Time   NA 140 09/27/2017 1131   K 3.6 09/27/2017 1131   CL 106 09/27/2017 1131   CO2 26 09/27/2017 1131   GLUCOSE 133 (H) 09/27/2017 1131   BUN 7 (L) 09/27/2017 1131   CREATININE 0.82 09/27/2017 1131   CREATININE 0.97 08/16/2017 0804   CALCIUM 9.8 09/27/2017 1131   PROT  7.2 09/27/2017 1131   ALBUMIN 3.8 09/27/2017 1131   AST 14 (L) 09/27/2017 1131   AST 19 08/16/2017 0804   ALT 24 09/27/2017 1131   ALT 20 08/16/2017 0804   ALKPHOS 139 (H) 09/27/2017 1131   BILITOT 0.6 09/27/2017 1131   BILITOT 0.6 08/16/2017 0804   GFRNONAA >60 09/27/2017 1131   GFRNONAA >60 08/16/2017 0804   GFRAA >60 09/27/2017 1131   GFRAA >60 08/16/2017 0804    No results found for: TOTALPROTELP, ALBUMINELP, A1GS, A2GS, BETS, BETA2SER, GAMS, MSPIKE, SPEI  No results found for: KPAFRELGTCHN, LAMBDASER, KAPLAMBRATIO  Lab Results  Component Value Date   WBC 7.2 09/27/2017   NEUTROABS 3.9 09/27/2017   HGB 10.6 (L) 09/27/2017   HCT 32.7 (L) 09/27/2017   MCV 79.9 09/27/2017   PLT 213 09/27/2017    '@LASTCHEMISTRY'$ @  No results found for: LABCA2  No components found for: QQIWLN989  No results for input(s): INR in the last 168 hours.  No results found for: LABCA2  No results found for: QJJ941  No results found for: DEY814  No results found for: GYJ856  No results found for: CA2729  No components found for: HGQUANT  No results found for: CEA1 / No results found for: CEA1   No results found for: AFPTUMOR  No results found for: CHROMOGRNA  No results found for: PSA1  Appointment on 09/27/2017  Component Date Value Ref Range Status  . Sodium 09/27/2017 140  135 - 145 mmol/L Final  . Potassium 09/27/2017 3.6  3.5 - 5.1 mmol/L Final  . Chloride 09/27/2017 106  98 - 111 mmol/L Final  . CO2 09/27/2017 26  22 - 32 mmol/L Final  . Glucose, Bld 09/27/2017 133* 70 - 99 mg/dL  Final  . BUN 09/27/2017 7* 8 - 23 mg/dL Final  . Creatinine, Ser 09/27/2017 0.82  0.44 - 1.00 mg/dL Final  . Calcium 09/27/2017 9.8  8.9 - 10.3 mg/dL Final  . Total Protein 09/27/2017 7.2  6.5 - 8.1 g/dL Final  . Albumin 09/27/2017 3.8  3.5 - 5.0 g/dL Final  . AST 09/27/2017 14* 15 - 41 U/L Final  . ALT 09/27/2017 24  0 - 44 U/L Final  . Alkaline Phosphatase 09/27/2017 139* 38 - 126 U/L Final  . Total Bilirubin 09/27/2017 0.6  0.3 - 1.2 mg/dL Final  . GFR calc non Af Amer 09/27/2017 >60  >60 mL/min Final  . GFR calc Af Amer 09/27/2017 >60  >60 mL/min Final   Comment: (NOTE) The eGFR has been calculated using the CKD EPI equation. This calculation has not been validated in Hopkins clinical situations. eGFR's persistently <60 mL/min signify possible Chronic Kidney Disease.   Georgiann Hahn gap 09/27/2017 8  5 - 15 Final   Performed at [facility], [place] [address] [place], [place] [address]  . WBC 09/27/2017 7.2  3.9 - 10.3 K/uL Final  . RBC 09/27/2017 4.09  3.70 - 5.45 MIL/uL Final  . Hemoglobin 09/27/2017 10.6* 11.6 - 15.9 g/dL Final  . HCT 09/27/2017 32.7* 34.8 - 46.6 % Final  . MCV 09/27/2017 79.9  79.5 - 101.0 fL Final  . MCH 09/27/2017 25.8  25.1 - 34.0 pg Final  . MCHC 09/27/2017 32.3  31.5 - 36.0 g/dL Final  . RDW 09/27/2017 14.6* 11.2 - 14.5 % Final  . Platelets 09/27/2017 213  145 - 400 K/uL Final  . Neutrophils Relative % 09/27/2017 54  % Final  . Neutro Abs 09/27/2017 3.9  1.5 - 6.5 K/uL Final  . Lymphocytes Relative 09/27/2017 33  % Final  . Lymphs Abs 09/27/2017 2.4  0.9 - 3.3 K/uL Final  . Monocytes Relative 09/27/2017 7  % Final  . Monocytes Absolute 09/27/2017 0.5  0.1 - 0.9 K/uL Final  . Eosinophils Relative 09/27/2017 5  % Final  . Eosinophils Absolute 09/27/2017 0.3  0.0 - 0.5 K/uL Final  . Basophils Relative 09/27/2017 1  % Final  . Basophils Absolute 09/27/2017 0.0  0.0 - 0.1 K/uL Final   Performed at [facility], [place] [address] [place], [place] [address]    (this displays the last labs from the last 3 days)  No results found for: TOTALPROTELP, ALBUMINELP, A1GS, A2GS, BETS, BETA2SER, GAMS, MSPIKE, SPEI (this displays SPEP labs)  No results found for: KPAFRELGTCHN, LAMBDASER, KAPLAMBRATIO (kappa/lambda light chains)  No results found for: HGBA, HGBA2QUANT, HGBFQUANT, HGBSQUAN (Hemoglobinopathy evaluation)   No results found for: LDH  No results found for: IRON, TIBC, IRONPCTSAT (Iron and TIBC)  No results found for: FERRITIN  Urinalysis    Component Value Date/Time   BILIRUBINUR n 08/25/2016 1407   KETONESUR trace (5) (A) 08/15/2016 1732   PROTEINUR n 08/25/2016 1407   UROBILINOGEN 0.2 08/25/2016 1407   NITRITE n 08/25/2016 1407   LEUKOCYTESUR Negative 08/25/2016 1407     STUDIES: Ct Chest W Contrast  Result Date: 08/30/2017 CLINICAL DATA:  New diagnosis of left breast cancer. Initial staging evaluation. EXAM: CT CHEST WITH CONTRAST TECHNIQUE: Multidetector CT imaging of the chest was performed during intravenous contrast administration. CONTRAST:  53m OMNIPAQUE IOHEXOL 300 MG/ML  SOLN COMPARISON:  None. FINDINGS: Cardiovascular: Normal heart size. No significant pericardial effusion/thickening. Atherosclerotic nonaneurysmal thoracic aorta. Normal caliber pulmonary arteries. No central pulmonary emboli. Mediastinum/Nodes: Mild multinodular goiter with dominant heterogeneous hypodense 1.6 cm upper left thyroid lobe nodule. Unremarkable esophagus. Multiple bulky left axillary lymph nodes, largest 3.6 cm (series 2/image 26). No right axillary adenopathy. No mediastinal or hilar adenopathy. Lungs/Pleura: No pneumothorax. No pleural effusion. No acute consolidative airspace disease, lung masses or significant pulmonary nodules. Tiny calcified granuloma in the lingula. Small parenchymal band in the medial left lower lobe compatible with mild postinfectious/postinflammatory scarring. Upper abdomen: No  acute abnormality. Musculoskeletal: No aggressive appearing focal osseous lesions. Mild thoracic spondylosis. Solid 2.4 x 1.8 cm outer upper posterior left breast mass (series 2/image 69). IMPRESSION: 1. Solid 2.4 cm outer upper posterior left breast mass, compatible with known primary left breast carcinoma. 2. Bulky left axillary nodal metastases. 3. No additional potential sites of metastatic disease in the chest. 4. Mild multinodular goiter with dominant 1.6 cm upper left thyroid lobe nodule. Recommend correlation with thyroid ultrasound. This follows ACR consensus guidelines: Managing Incidental Thyroid Nodules Detected on Imaging: White Paper of the ACR Incidental Thyroid Findings Committee. J Am Coll Radiol 2015; 12:143-150.5. Aortic Atherosclerosis (ICD10-I70.0). Electronically Signed   By: JIlona SorrelM.D.   On: 08/30/2017 15:08   Nm Bone Scan Whole Body  Result Date: 08/30/2017 CLINICAL DATA:  62year old female with recently diagnosed breast cancer with left axillary nodal metastasis. Bilateral knee pain. EXAM: NUCLEAR MEDICINE WHOLE BODY BONE SCAN TECHNIQUE: Whole body anterior and posterior images were obtained approximately 3 hours after intravenous injection of radiopharmaceutical. RADIOPHARMACEUTICALS:  20.6 mCi Technetium-964mDP IV COMPARISON:  Chest CT 08/30/2017. FINDINGS: Expected radiotracer activity in both kidneys and the urinary bladder. Mild levoconvex thoracic scoliosis. Homogeneous radiotracer activity throughout the axial skeleton including the skull and pelvis. Scattered  moderate and occasionally intense foci of radiotracer activity at both knees, both elbows. Symmetric appearance of moderate increased radiotracer activity at both ankles, great toes. These appear degenerative in nature. No suspicious axial or appendicular skeleton activity identified. IMPRESSION: 1.  No findings specific for metastatic disease to bone. 2. Degenerative appearing radiotracer activity at the knees,  elbows, ankles, feet. Electronically Signed   By: Genevie Ann M.D.   On: 08/30/2017 17:08   Dg Chest Port 1 View  Result Date: 09/06/2017 CLINICAL DATA:  Porta catheter placement EXAM: PORTABLE CHEST 1 VIEW COMPARISON:  CT scan chest of August 30, 2017 FINDINGS: The patient has undergone placement of Port-A-Cath appliance on the right. The tip of the catheter projects over the midportion of the SVC. The lungs are well-expanded. There is no focal infiltrate. The heart is top-normal in size. The pulmonary vascularity is normal. The mediastinum is normal in width. There is no pleural effusion. The bony thorax is unremarkable. IMPRESSION: No postprocedure complication following porta catheter placement. No acute cardiopulmonary abnormality. Electronically Signed   By: David  Martinique M.D.   On: 09/06/2017 10:13   Dg Fluoro Guide Cv Line-no Report  Result Date: 09/06/2017 Fluoroscopy was utilized by the requesting physician.  No radiographic interpretation.    ELIGIBLE FOR AVAILABLE RESEARCH PROTOCOL: yes (see Research Studies)  ASSESSMENT: 62 y.o. [place], [place] woman status post left breast upper outer quadrant and left axillary lymph node biopsy 08/07/2017, both positive for a T2 N1, stage IIB invasive ductal carcinoma, grade 3, estrogen receptor weakly positive, progesterone receptor negative, but HER-2 amplified, with an MIB-1 of 80%  (a) staging chest CT and bone scan 08/30/2017 showed no evidence of metastatic disease  (1) neoadjuvant chemotherapy will consist of carboplatin, docetaxel, trastuzumab, and Pertuzumab given every 21 days x 6 starting 09/07/2017  (2) trastuzumab will be continued to complete 6 months  (a) echocardiogram 08/28/2017 showed an ejection fraction in the 60-65% range.  (3) definitive surgery to follow  (4) adjuvant radiation to follow surgery  (5) will start antiestrogens at the completion of local therapy  PLAN:  Jenesis is doing well today.  Her labs are stable and she is  ready to proceed with her second cycle of Docetaxel, Carboplatin, and Trastuzumab.  We are omitting Pertuzumab with this cycle.  I reviewed her anti emetics with her in detail again, and reinforced she could take the Diflucan daily x 5 days.  She is doing well with her activity level and will continue this, alternating with rest when needed.    We will see her back in one week for labs and follow up.    She knows to call for any other issues that may develop before the next visit. Once she completes her chemotherapy she will have a repeat breast MRI and proceed to surgery.    A total of (30) minutes of face-to-face time was spent with this patient with greater than 50% of that time in counseling and care-coordination.   Wilber Bihari, NP  09/28/17 8:26 AM Medical Oncology and Hematology [facility] [address] [place], [place] [address] Tel. [phone]    Fax. [phone]

## 2017-09-28 NOTE — Patient Instructions (Signed)
[facility] Discharge Instructions for Patients Receiving Chemotherapy  Today you received the following chemotherapy agents: Trastuzumab (Herceptin), Docetaxel (Taxotere), and Carboplatin (Paraplatin)  To help prevent nausea and vomiting after your treatment, we encourage you to take your nausea medication as prescribed. Received Aloxi during treatment today-->Take Compazine (not Zofran) for the next 3 days as needed.    If you develop nausea and vomiting that is not controlled by your nausea medication, call the clinic.   BELOW ARE SYMPTOMS THAT SHOULD BE REPORTED IMMEDIATELY:  *FEVER GREATER THAN 100.5 F  *CHILLS WITH OR WITHOUT FEVER  NAUSEA AND VOMITING THAT IS NOT CONTROLLED WITH YOUR NAUSEA MEDICATION  *UNUSUAL SHORTNESS OF BREATH  *UNUSUAL BRUISING OR BLEEDING  TENDERNESS IN MOUTH AND THROAT WITH OR WITHOUT PRESENCE OF ULCERS  *URINARY PROBLEMS  *BOWEL PROBLEMS  UNUSUAL RASH Items with * indicate a potential emergency and should be followed up as soon as possible.  Feel free to call the clinic should you have any questions or concerns. The clinic phone number is [phone].  Please show the [place] at check-in to the Emergency Department and triage nurse.  Pegfilgrastim injection (OnPro) What is this medicine? PEGFILGRASTIM (PEG fil gra stim) is a long-acting granulocyte colony-stimulating factor that stimulates the growth of neutrophils, a type of white blood cell important in the body's fight against infection. It is used to reduce the incidence of fever and infection in patients with certain types of cancer who are receiving chemotherapy that affects the bone marrow, and to increase survival after being exposed to high doses of radiation. This medicine may be used for other purposes; ask your health care provider or pharmacist if you have questions. COMMON BRAND NAME(S): Neulasta What should I tell my health care provider before I take this  medicine? They need to know if you have any of these conditions: -kidney disease -latex allergy -ongoing radiation therapy -sickle cell disease -skin reactions to acrylic adhesives (On-Body Injector only) -an unusual or allergic reaction to pegfilgrastim, filgrastim, other medicines, foods, dyes, or preservatives -pregnant or trying to get pregnant -breast-feeding How should I use this medicine? This medicine is for injection under the skin. If you get this medicine at home, you will be taught how to prepare and give the pre-filled syringe or how to use the On-body Injector. Refer to the patient Instructions for Use for detailed instructions. Use exactly as directed. Tell your healthcare provider immediately if you suspect that the On-body Injector may not have performed as intended or if you suspect the use of the On-body Injector resulted in a missed or partial dose. It is important that you put your used needles and syringes in a special sharps container. Do not put them in a trash can. If you do not have a sharps container, call your pharmacist or healthcare provider to get one. Talk to your pediatrician regarding the use of this medicine in children. While this drug may be prescribed for selected conditions, precautions do apply. Overdosage: If you think you have taken too much of this medicine contact a poison control center or emergency room at once. NOTE: This medicine is only for you. Do not share this medicine with others. What if I miss a dose? It is important not to miss your dose. Call your doctor or health care professional if you miss your dose. If you miss a dose due to an On-body Injector failure or leakage, a new dose should be administered as soon as  possible using a single prefilled syringe for manual use. What may interact with this medicine? Interactions have not been studied. Give your health care provider a list of all the medicines, herbs, non-prescription drugs, or  dietary supplements you use. Also tell them if you smoke, drink alcohol, or use illegal drugs. Some items may interact with your medicine. This list may not describe all possible interactions. Give your health care provider a list of all the medicines, herbs, non-prescription drugs, or dietary supplements you use. Also tell them if you smoke, drink alcohol, or use illegal drugs. Some items may interact with your medicine. What should I watch for while using this medicine? You may need blood work done while you are taking this medicine. If you are going to need a MRI, CT scan, or other procedure, tell your doctor that you are using this medicine (On-Body Injector only). What side effects may I notice from receiving this medicine? Side effects that you should report to your doctor or health care professional as soon as possible: -allergic reactions like skin rash, itching or hives, swelling of the face, lips, or tongue -dizziness -fever -pain, redness, or irritation at site where injected -pinpoint red spots on the skin -red or dark-brown urine -shortness of breath or breathing problems -stomach or side pain, or pain at the shoulder -swelling -tiredness -trouble passing urine or change in the amount of urine Side effects that usually do not require medical attention (report to your doctor or health care professional if they continue or are bothersome): -bone pain -muscle pain This list may not describe all possible side effects. Call your doctor for medical advice about side effects. You may report side effects to FDA at 1-800-FDA-1088. Where should I keep my medicine? Keep out of the reach of children. Store pre-filled syringes in a refrigerator between 2 and 8 degrees C (36 and 46 degrees F). Do not freeze. Keep in carton to protect from light. Throw away this medicine if it is left out of the refrigerator for more than 48 hours. Throw away any unused medicine after the expiration  date. NOTE: This sheet is a summary. It may not cover all possible information. If you have questions about this medicine, talk to your doctor, pharmacist, or health care provider.  2018 Elsevier/Gold Standard (2016-02-11 12:58:03)

## 2017-09-29 ENCOUNTER — Other Ambulatory Visit: Payer: Self-pay | Admitting: Oncology

## 2017-09-29 DIAGNOSIS — Z170 Estrogen receptor positive status [ER+]: Principal | ICD-10-CM

## 2017-09-29 DIAGNOSIS — C50412 Malignant neoplasm of upper-outer quadrant of left female breast: Secondary | ICD-10-CM

## 2017-09-29 LAB — GLUCOSE, CAPILLARY: Glucose-Capillary: 187 mg/dL — ABNORMAL HIGH (ref 70–99)

## 2017-10-02 ENCOUNTER — Other Ambulatory Visit: Payer: Self-pay

## 2017-10-02 MED ORDER — LOPERAMIDE HCL 2 MG PO CAPS: 2.0000 mg | ORAL_CAPSULE | ORAL | 0 refills | Status: DC | PRN

## 2017-10-06 ENCOUNTER — Encounter: Payer: Self-pay | Admitting: Adult Health

## 2017-10-06 ENCOUNTER — Inpatient Hospital Stay: Payer: 59

## 2017-10-06 ENCOUNTER — Inpatient Hospital Stay (HOSPITAL_BASED_OUTPATIENT_CLINIC_OR_DEPARTMENT_OTHER): Payer: 59 | Admitting: Adult Health

## 2017-10-06 VITALS — BP 130/71 | HR 85 | Temp 98.7°F | Resp 18 | Ht 67.5 in | Wt 187.0 lb

## 2017-10-06 DIAGNOSIS — Z170 Estrogen receptor positive status [ER+]: Principal | ICD-10-CM

## 2017-10-06 DIAGNOSIS — D630 Anemia in neoplastic disease: Secondary | ICD-10-CM

## 2017-10-06 DIAGNOSIS — C50412 Malignant neoplasm of upper-outer quadrant of left female breast: Secondary | ICD-10-CM | POA: Diagnosis not present

## 2017-10-06 DIAGNOSIS — C773 Secondary and unspecified malignant neoplasm of axilla and upper limb lymph nodes: Secondary | ICD-10-CM

## 2017-10-06 DIAGNOSIS — Z5111 Encounter for antineoplastic chemotherapy: Secondary | ICD-10-CM | POA: Diagnosis not present

## 2017-10-06 DIAGNOSIS — K644 Residual hemorrhoidal skin tags: Secondary | ICD-10-CM | POA: Diagnosis not present

## 2017-10-06 DIAGNOSIS — R197 Diarrhea, unspecified: Secondary | ICD-10-CM | POA: Diagnosis not present

## 2017-10-06 DIAGNOSIS — N898 Other specified noninflammatory disorders of vagina: Secondary | ICD-10-CM

## 2017-10-06 DIAGNOSIS — B009 Herpesviral infection, unspecified: Secondary | ICD-10-CM

## 2017-10-06 LAB — CBC WITH DIFFERENTIAL/PLATELET
Basophils Absolute: 0 10*3/uL (ref 0.0–0.1)
Basophils Relative: 0 %
Eosinophils Absolute: 0 10*3/uL (ref 0.0–0.5)
Eosinophils Relative: 0 %
HCT: 33.3 % — ABNORMAL LOW (ref 34.8–46.6)
Hemoglobin: 10.6 g/dL — ABNORMAL LOW (ref 11.6–15.9)
Lymphocytes Relative: 21 %
Lymphs Abs: 3.3 10*3/uL (ref 0.9–3.3)
MCH: 25.7 pg (ref 25.1–34.0)
MCHC: 31.9 g/dL (ref 31.5–36.0)
MCV: 80.6 fL (ref 79.5–101.0)
Monocytes Absolute: 1.9 10*3/uL — ABNORMAL HIGH (ref 0.1–0.9)
Monocytes Relative: 13 %
Neutro Abs: 10 10*3/uL — ABNORMAL HIGH (ref 1.5–6.5)
Neutrophils Relative %: 66 %
Platelets: 209 10*3/uL (ref 145–400)
RBC: 4.14 MIL/uL (ref 3.70–5.45)
RDW: 15.2 % — ABNORMAL HIGH (ref 11.2–14.5)
WBC: 15.2 10*3/uL — ABNORMAL HIGH (ref 3.9–10.3)

## 2017-10-06 LAB — COMPREHENSIVE METABOLIC PANEL
ALT: 38 U/L (ref 0–44)
AST: 21 U/L (ref 15–41)
Albumin: 4.1 g/dL (ref 3.5–5.0)
Alkaline Phosphatase: 179 U/L — ABNORMAL HIGH (ref 38–126)
Anion gap: 10 (ref 5–15)
BUN: 10 mg/dL (ref 8–23)
CO2: 24 mmol/L (ref 22–32)
Calcium: 9.7 mg/dL (ref 8.9–10.3)
Chloride: 101 mmol/L (ref 98–111)
Creatinine, Ser: 0.93 mg/dL (ref 0.44–1.00)
GFR calc Af Amer: >60 mL/min (ref >60)
GFR calc non Af Amer: >60 mL/min (ref >60)
Glucose, Bld: 96 mg/dL (ref 70–99)
Potassium: 3.6 mmol/L (ref 3.5–5.1)
Sodium: 135 mmol/L (ref 135–145)
Total Bilirubin: 0.5 mg/dL (ref 0.3–1.2)
Total Protein: 7.6 g/dL (ref 6.5–8.1)

## 2017-10-06 MED ORDER — SODIUM CHLORIDE 0.9% FLUSH
10.0000 mL | INTRAVENOUS | Status: DC | PRN
  Administered 2017-10-06: 10 mL
  Filled 2017-10-06: qty 10

## 2017-10-06 MED ORDER — SODIUM CHLORIDE 0.9 % IV SOLN
INTRAVENOUS | Status: DC
  Administered 2017-10-06 (×2): via INTRAVENOUS
  Filled 2017-10-06 (×3): qty 250

## 2017-10-06 MED ORDER — VALACYCLOVIR HCL 500 MG PO TABS: 500.0000 mg | ORAL_TABLET | Freq: Two times a day (BID) | ORAL | 2 refills | Status: DC

## 2017-10-06 MED ORDER — ACYCLOVIR 5 % EX OINT: 1.0000 "application " | TOPICAL_OINTMENT | CUTANEOUS | 2 refills | Status: DC

## 2017-10-06 MED ORDER — HYDROCORTISONE ACETATE 25 MG RE SUPP: 25.0000 mg | Freq: Two times a day (BID) | RECTAL | 0 refills | Status: DC

## 2017-10-06 NOTE — Patient Instructions (Signed)

## 2017-10-06 NOTE — Progress Notes (Addendum)
Madison Hopkins  [phone] Fax:(336) [phone]     ID: DEVANNY PALECEK DOB: May 02, 1955  MR#: [id number]  SLH#:[id number]  Patient Care Team: Donzetta Kohut as PCP - General (Physician Assistant) [name], MD as Consulting Physician (General Surgery) [name], [name] Dad, MD as Consulting Physician (Oncology) Kyung Rudd, MD as Consulting Physician (Radiation Oncology) [name], [name] All, MD as Consulting Physician (Obstetrics and Gynecology) Gentry Fitz, MD as Consulting Physician (Family Medicine) OTHER MD:  CHIEF COMPLAINT: HER-2 positive breast cancer  CURRENT TREATMENT: Neoadjuvant chemotherapy  INTERVAL HISTORY: Madison Hopkins returns today for follow-up of her HER-2 positive, weakly estrogen receptor positive breast cancer accompanied by her brother, son and daughter. She is receiving neoadjuvant chemotherapy with carboplatin, docetaxel, trastuzumab, and pertuzumab. Today is day 8 cycle 2.   REVIEW OF SYSTEMS: [name] is doing moderately well today.  She has several issues today.  She has a decreased appetite.  She says that she is forcing herself to eat whatever she can eat such as french fries, dry cereal such as cheerios, yogurt, smoothies.  She is concerned about her appetite loss and her weight decrease.  She is drinking 67 fluid ounces per day.  She is taking Diflucan daily throughout chemotherapy.  She notes some vaginal discomfort.  She notes it as irritation/odor.  She has h/o genital herpes.  She notes she had a flare up in her gluteal fold.  She also notes some hemorrhoids.  These are noted externally. These aren't bleeding, however she has tried over the counter Tucks pads, and Preparation H without relief.   Sanaa has had diarrhea.  She is experiencing diarrhea that started this past Wednesday through today.  She is having 2-3 loose bowel movements per day.  They are not watery.  She estimates that they are large volume.  When this  happens she takes Imodium, 2 tablets.  If that doesn't work, then she will take Cholestyramine.  That helps, but it has remained.  Otherwise, Kaelei is doing moderately well and a detailed ROS was non contributory.     HISTORY OF CURRENT ILLNESS: From the original intake note:  Madison Hopkins noted a mass in the left axilla sometime in January or February 2019.  She eventually brought her to her gynecologist's attention, and underwent bilateral diagnostic mammography with tomography and left breast ultrasonography at [facility] on 08/04/2017 showing: breast density category B. There is a highly suspicious hypoechoic mass in the left breast at the 2 o'clock upper outer quadrant measuring 2.3 x 1.6 x 2.2 cm, located 2 cm from the nipple. Sonographically, there were 2 enlarged lymph nodes in the left axilla, the largest with cortical thickening measuring 2.5 cm. No evidence of malignancy was seen in the right breast.   Accordingly on 08/07/2017 she proceeded to biopsy of the left breast area and 1 of the lymph nodes in question. The pathology from this procedure showed ([id number]): Invasive ductal carcinoma, grade 3. Metastatic carcinoma was found in one left axillary lymph node. Prognostic indicators significant for: estrogen receptor, 30% positive with weak staining intensity and progesterone receptor, 0% negative. Proliferation marker Ki67 at 80%. HER2 amplified with ratios HER2/CEP17 signals 2.32 and average HER2 copies per cell 6.60  The patient's subsequent history is as detailed below.   PAST MEDICAL HISTORY: Past Medical History:  Diagnosis Date  . Abnormal Pap smear of vagina 07/28/2016   LGSIL; colpo 07/2016 atrophic squamous cells; colpo 07/2017 - atypia  . Allergy   .  Breast cancer ([facility])    Metastatic Left breast  . Elevated hemoglobin A1c 07/28/2016   level - 6.1  . Endometriosis   . Low vitamin D level 07/28/2016   level 24.7  . STD (sexually transmitted disease)   GERD  but no ulcers  PAST SURGICAL HISTORY: Past Surgical History:  Procedure Laterality Date  . ABDOMINAL HYSTERECTOMY    . CESAREAN SECTION    . COLON SURGERY    . OOPHORECTOMY    . PORTACATH PLACEMENT N/A 09/06/2017   Procedure: INSERTION PORT-A-CATH;  Surgeon: [name], MD;  Location: [facility];  Service: General;  Laterality: N/A;  . SMALL INTESTINE SURGERY    . SPINE SURGERY    Hysterectomy with salpingo-oophorectomy, Tonsillectomy, Plantar Fascitis Right Foot Surgery    FAMILY HISTORY Family History  Problem Relation Age of Onset  . Mental illness Mother   . Alcoholism Mother   . Cirrhosis Mother   . Cancer Father   . Lung cancer Father   The patient' father died at age 62 due to lung cancer (heavy smoker). The patient's mother died due to liver cirrhosis (heavy drinker). The patient has 2 brothers and no sisters. There was a paternal 1st cousin with colon cancer diagnosed in the mid 40's, who also had cervical cancer. The mother of this 1st cousin (the patient's paternal aunt) had cancer (the patient's is unsure of what type). There was also a paternal uncle with prostate cancer diagnosed in the 77's. The patient denies a family history of breast or ovarian cancer.     GYNECOLOGIC HISTORY:  No LMP recorded (lmp unknown). Patient has had a hysterectomy. Menarche: 62 years old Age at first live birth: 62 years old She is GXP2.  The patient is status post total hysterectomy with bilateral salpingo-oophorectomy in 1995.  She never used contraception. She notes that she had an estrogen shot one time but no other HRTs.    SOCIAL HISTORY:  The patient works in Therapist, art for the tax department. She is single. At home is herself and no pets. Her son, [name] is age 59 and lives in [place], [place] as a Musician. The patient's daughter [name] is age 62 and lives in [place] in Therapist, art for [employer]. The patient has 5 grandchildren and 1 great grandchild. She attends [employer].     ADVANCED DIRECTIVES: Not in place; at the 08/16/2017 visit the patient was given the appropriate forms to complete on notarized at her discretion   HEALTH MAINTENANCE: Social History   Tobacco Use  . Smoking status: Never Smoker  . Smokeless tobacco: Never Used  Substance Use Topics  . Alcohol use: Yes    Alcohol/week: 0.0 standard drinks    Comment: occasional  . Drug use: Yes    Types: Marijuana     Colonoscopy: 2009?  PAP: 07/31/2017/ positive for Atypical Squamous cells of uncertain significance.   Bone density: 10/24/2016 showed a T score of -0.7 (normal was (   Allergies  Allergen Reactions  . Penicillins Nausea Only and Other (See Comments)    Has patient had a PCN reaction causing immediate rash, facial/tongue/throat swelling, SOB or lightheadedness with hypotension: No Has patient had a PCN reaction causing severe rash involving mucus membranes or skin necrosis: No Has patient had a PCN reaction that required hospitalization: No Has patient had a PCN reaction occurring within the last 10 years: Yes--nausea & headache ONLY If all of the above answers are "NO", then  may proceed with Cephalosporin use.     Current Outpatient Medications  Medication Sig Dispense Refill  . Artificial Tear Solution (OPTI-TEARS OP) Place 1 drop into both eyes 3 (three) times daily as needed (for dry/irritated eyes.).    Marland Kitchen cholestyramine (QUESTRAN) 4 GM/DOSE powder Take 1 packet (4 g total) by mouth 2 (two) times daily with a meal. 378 g 2  . dexamethasone (DECADRON) 4 MG tablet Take 2 tablets (8 mg total) by mouth 2 (two) times daily. Start the day before Taxotere. Take once the day after, then 2 times a day x 2d. 30 tablet 1  . fluconazole (DIFLUCAN) 100 MG tablet TAKE DAILY FOR 5 DAYS STARTING ON CHEMO DAY 30 tablet 0  . fluticasone (FLONASE) 50 MCG/ACT nasal spray Place 1 spray into both nostrils daily as needed for allergies.    Marland Kitchen HYDROcodone-acetaminophen  (NORCO/VICODIN) 5-325 MG tablet Take 1 tablet by mouth every 6 (six) hours as needed for moderate pain. 10 tablet 0  . lidocaine-prilocaine (EMLA) cream Apply to affected area once 30 g 3  . loperamide (IMODIUM) 2 MG capsule Take 1 capsule (2 mg total) by mouth as needed for diarrhea or loose stools. 30 capsule 0  . LORazepam (ATIVAN) 0.5 MG tablet TAKE 1 TABLET (0.5 MG TOTAL) BY MOUTH AT BEDTIME AS NEEDED (NAUSEA OR VOMITING). 30 tablet 1  . Misc Natural Products (OSTEO BI-FLEX ADV TRIPLE ST PO) Take 1 tablet by mouth daily after lunch.    . Multiple Vitamin (MULTIVITAMIN WITH MINERALS) TABS tablet Take 1 tablet by mouth daily after lunch.    . naproxen sodium (ALEVE) 220 MG tablet Take 220 mg by mouth 2 (two) times daily as needed (FOR PAIN.).     Marland Kitchen Nutritional Supplements (IMMUNE ENHANCE) TABS Take 1 tablet by mouth daily after lunch.    . prochlorperazine (COMPAZINE) 10 MG tablet Take 1 tablet (10 mg total) by mouth every 6 (six) hours as needed (Nausea or vomiting). 30 tablet 1  . tetrahydrozoline (VISINE) 0.05 % ophthalmic solution Place 1 drop into both eyes 3 (three) times daily as needed (for dry eyes.).     No current facility-administered medications for this visit.     OBJECTIVE:  Vitals:   10/06/17 1325  BP: 130/71  Pulse: 85  Resp: 18  Temp: 98.7 F (37.1 C)  SpO2: 100%     Body mass index is 28.86 kg/m.   Wt Readings from Last 3 Encounters:  10/06/17 187 lb (84.8 kg)  09/28/17 194 lb (88 kg)  09/14/17 190 lb 11.2 oz (86.5 kg)  ECOG FS:1 - Symptomatic but completely ambulatory GENERAL: Patient is a well appearing female in no acute distress HEENT:  Sclerae anicteric.  Oropharynx clear and moist. No ulcerations or evidence of oropharyngeal candidiasis. Neck is supple.  NODES:  No cervical, supraclavicular, or axillary lymphadenopathy palpated.  BREAST EXAM:  Deferred. LUNGS:  Clear to auscultation bilaterally.  No wheezes or rhonchi. HEART:  Regular rate and rhythm.  No murmur appreciated. ABDOMEN:  Soft, nontender.  Positive, normoactive bowel sounds. No organomegaly palpated. MSK:  No focal spinal tenderness to palpation. Full range of motion bilaterally in the upper extremities. EXTREMITIES:  No peripheral edema.   SKIN:  Clear with no obvious rashes or skin changes. No nail dyscrasia. + 1 external hemorrhoid, 3 small genital herpes lesions noted posterior to anus, no sign of infection noted. NEURO:  Nonfocal. Well oriented.  Appropriate affect.    LAB RESULTS:  CMP  Component Value Date/Time   NA 140 09/27/2017 1131   K 3.6 09/27/2017 1131   CL 106 09/27/2017 1131   CO2 26 09/27/2017 1131   GLUCOSE 133 (H) 09/27/2017 1131   BUN 7 (L) 09/27/2017 1131   CREATININE 0.82 09/27/2017 1131   CREATININE 0.97 08/16/2017 0804   CALCIUM 9.8 09/27/2017 1131   PROT 7.2 09/27/2017 1131   ALBUMIN 3.8 09/27/2017 1131   AST 14 (L) 09/27/2017 1131   AST 19 08/16/2017 0804   ALT 24 09/27/2017 1131   ALT 20 08/16/2017 0804   ALKPHOS 139 (H) 09/27/2017 1131   BILITOT 0.6 09/27/2017 1131   BILITOT 0.6 08/16/2017 0804   GFRNONAA >60 09/27/2017 1131   GFRNONAA >60 08/16/2017 0804   GFRAA >60 09/27/2017 1131   GFRAA >60 08/16/2017 0804    No results found for: TOTALPROTELP, ALBUMINELP, A1GS, A2GS, BETS, BETA2SER, GAMS, MSPIKE, SPEI  No results found for: KPAFRELGTCHN, LAMBDASER, KAPLAMBRATIO  Lab Results  Component Value Date   WBC 15.2 (H) 10/06/2017   NEUTROABS 10.0 (H) 10/06/2017   HGB 10.6 (L) 10/06/2017   HCT 33.3 (L) 10/06/2017   MCV 80.6 10/06/2017   PLT 209 10/06/2017    _0 @  No results found for: LABCA2  No components found for: MPNTIR443  No results for input(s): INR in the last 168 hours.  No results found for: LABCA2  No results found for: XVQ008  No results found for: QPY195  No results found for: KDT267  No results found for: CA2729  No components found for: HGQUANT  No results found for: CEA1 / No  results found for: CEA1   No results found for: AFPTUMOR  No results found for: CHROMOGRNA  No results found for: PSA1  Appointment on 10/06/2017  Component Date Value Ref Range Status  . WBC 10/06/2017 15.2* 3.9 - 10.3 K/uL Final  . RBC 10/06/2017 4.14  3.70 - 5.45 MIL/uL Final  . Hemoglobin 10/06/2017 10.6* 11.6 - 15.9 g/dL Final  . HCT 10/06/2017 33.3* 34.8 - 46.6 % Final  . MCV 10/06/2017 80.6  79.5 - 101.0 fL Final  . MCH 10/06/2017 25.7  25.1 - 34.0 pg Final  . MCHC 10/06/2017 31.9  31.5 - 36.0 g/dL Final  . RDW 10/06/2017 15.2* 11.2 - 14.5 % Final  . Platelets 10/06/2017 209  145 - 400 K/uL Final  . Neutrophils Relative % 10/06/2017 66  % Final  . Neutro Abs 10/06/2017 10.0* 1.5 - 6.5 K/uL Final  . Lymphocytes Relative 10/06/2017 21  % Final  . Lymphs Abs 10/06/2017 3.3  0.9 - 3.3 K/uL Final  . Monocytes Relative 10/06/2017 13  % Final  . Monocytes Absolute 10/06/2017 1.9* 0.1 - 0.9 K/uL Final  . Eosinophils Relative 10/06/2017 0  % Final  . Eosinophils Absolute 10/06/2017 0.0  0.0 - 0.5 K/uL Final  . Basophils Relative 10/06/2017 0  % Final  . Basophils Absolute 10/06/2017 0.0  0.0 - 0.1 K/uL Final   Performed at [facility], [place] [address], [place], [place] [address]    (this displays the last labs from the last 3 days)  No results found for: TOTALPROTELP, ALBUMINELP, A1GS, A2GS, BETS, BETA2SER, GAMS, MSPIKE, SPEI (this displays SPEP labs)  No results found for: KPAFRELGTCHN, LAMBDASER, KAPLAMBRATIO (kappa/lambda light chains)  No results found for: HGBA, HGBA2QUANT, HGBFQUANT, HGBSQUAN (Hemoglobinopathy evaluation)   No results found for: LDH  No results found for: IRON, TIBC, IRONPCTSAT (Iron and TIBC)  No results found  for: FERRITIN  Urinalysis    Component Value Date/Time   BILIRUBINUR n 08/25/2016 1407   KETONESUR trace (5) (A) 08/15/2016 1732   PROTEINUR n 08/25/2016 1407   UROBILINOGEN 0.2 08/25/2016 1407   NITRITE n  08/25/2016 1407   LEUKOCYTESUR Negative 08/25/2016 1407     STUDIES: No results found.  ELIGIBLE FOR AVAILABLE RESEARCH PROTOCOL: yes (see Research Studies)  ASSESSMENT: 62 y.o. [place], [place] woman status post left breast upper outer quadrant and left axillary lymph node biopsy 08/07/2017, both positive for a T2 N1, stage IIB invasive ductal carcinoma, grade 3, estrogen receptor weakly positive, progesterone receptor negative, but HER-2 amplified, with an MIB-1 of 80%  (a) staging chest CT and bone scan 08/30/2017 showed no evidence of metastatic disease  (1) neoadjuvant chemotherapy will consist of carboplatin, docetaxel, trastuzumab, and Pertuzumab given every 21 days x 6 starting 09/07/2017, perjeta omitted with cycle 2 due to diarrhea  (2) trastuzumab will be continued to complete 6 months  (a) echocardiogram 08/28/2017 showed an ejection fraction in the 60-65% range.  (3) definitive surgery to follow  (4) adjuvant radiation to follow surgery  (5) will start antiestrogens at the completion of local therapy  PLAN:  Fantasia has several issues today.  She tolerated her treatment moderately well, but does need some support.  Due to having several issues, I typed them in numerical order for ease below.  1. HER-2 positive breast cancer: Cycle 2 day 8 of neoadjuvant chemotherapy.  Her tumor is shrinking.  Her CBC is stable.  She is not neutropenic today.  She will continue on treatment.   2. Diarrhea: Recommended she continue with Questran and Loperamide and to increase fluid intake after diarrhea episodes.   3. Decreased appetite/taste changes: encouraged continued oral intake, and referred to our dietician 4.  External Hemorrhoid: sent in Anusol HC cream 5: Genital herpes: prescribed Valtrex BID 6. Vaginal irritation/odor: recommended f/u with gynecology for full eval.  She has continued on Diflucan daily.   7. Sleep challenges.  She can take two lorazepam if she absolutely has to for  sleep, however I encouraged her to only do when absolutely necessary, but to try one tablet first.  I instructed her to limit her fluid intake as it nears bed time and to look at her sleep habits.  I recommended that she also try to go without the lorazepam particularly in her off week.    I reviewed each of Wendie's medications in the medication list one by one and refilled what was necessary per our review together.    She knows to call for any other issues that may develop before the next visit. She met with Dr. Jana Hakim today briefly who gave her reassurance.  A total of (50) minutes of face-to-face time was spent with this patient with greater than 50% of that time in counseling and care-coordination.   Wilber Bihari, NP  10/06/17 1:32 PM Medical Oncology and Hematology [facility] [address] [place], [place] [address] Tel. [phone]    Fax. [phone]   ADDENDUM: Eldred is getting discouraged because of the variety of side effects she is experiencing.  They are all manageable but can cause her to stop her treatment or even infection given the problems with hemorrhoids and diarrhea.  I think it would be prudent to eliminate epratuzumab from cycle 3 coming up and I have done that.  That should take care of the diarrhea problem.  We can then consider whether to  resume it with cycles 4 and subsequent cycles  Her cancer already seems to be responding.  This is very favorable.  It was very encouraging to her.  I personally saw this patient and performed a substantive portion of this encounter with the listed APP documented above.   Chauncey Cruel, MD Medical Oncology and Hematology [facility] [address] [place], [place] [address] Tel. [phone]    Fax. [phone]

## 2017-10-07 ENCOUNTER — Other Ambulatory Visit: Payer: Self-pay

## 2017-10-07 ENCOUNTER — Ambulatory Visit: Payer: 59 | Admitting: Physician Assistant

## 2017-10-07 ENCOUNTER — Encounter: Payer: Self-pay | Admitting: Physician Assistant

## 2017-10-07 VITALS — BP 127/78 | HR 73 | Temp 98.3°F | Resp 18 | Ht 68.19 in | Wt 187.0 lb

## 2017-10-07 DIAGNOSIS — R300 Dysuria: Secondary | ICD-10-CM

## 2017-10-07 DIAGNOSIS — N3001 Acute cystitis with hematuria: Secondary | ICD-10-CM | POA: Diagnosis not present

## 2017-10-07 DIAGNOSIS — K645 Perianal venous thrombosis: Secondary | ICD-10-CM | POA: Diagnosis not present

## 2017-10-07 LAB — POC MICROSCOPIC URINALYSIS (UMFC): Mucus: ABSENT

## 2017-10-07 LAB — POCT URINALYSIS DIP (MANUAL ENTRY)
Bilirubin, UA: NEGATIVE
Glucose, UA: 100 mg/dL — AB
Ketones, POC UA: NEGATIVE mg/dL
Leukocytes, UA: NEGATIVE
Nitrite, UA: POSITIVE — AB
Protein Ur, POC: 30 mg/dL — AB
Spec Grav, UA: 1.01 (ref 1.010–1.025)
Urobilinogen, UA: 1 E.U./dL
pH, UA: 6 (ref 5.0–8.0)

## 2017-10-07 MED ORDER — NITROFURANTOIN MONOHYD MACRO 100 MG PO CAPS: 100.0000 mg | ORAL_CAPSULE | Freq: Two times a day (BID) | ORAL | 0 refills | Status: DC

## 2017-10-07 NOTE — Patient Instructions (Addendum)
  Take sitz baths 2-3 times daily for the next 2-3 days.  Simple warm water is all that is required, and the addition of other bath or Epsom salts is not necessary.  (see below for instructions) You may not need a suppository -  If you feel like you do require it, you may use it after 24 hours.  Keep area clean after episodes of diarrhea.   Start taking Macrobid for UTI.  Continue taking your Valtrex pills .  Come back and see me next week if you are still experiencing problems.   How to Take a Sitz Bath A sitz bath is a warm water bath that is taken while you are sitting down. The water should only come up to your hips and should cover your buttocks. Your health care provider may recommend a sitz bath to help you:  Clean the lower part of your body, including your genital area.  With itching.  With pain.  With sore muscles or muscles that tighten or spasm.  How to take a sitz bath Take 3-4 sitz baths per day or as told by your health care provider. 1. Partially fill a bathtub with warm water. You will only need the water to be deep enough to cover your hips and buttocks when you are sitting in it. 2. If your health care provider told you to put medicine in the water, follow the directions exactly. 3. Sit in the water and open the tub drain a little. 4. Turn on the warm water again to keep the tub at the correct level. Keep the water running constantly. 5. Soak in the water for 15-20 minutes or as told by your health care provider. 6. After the sitz bath, pat the affected area dry first. Do not rub it. 7. Be careful when you stand up after the sitz bath because you may feel dizzy.  Contact a health care provider if:  Your symptoms get worse. Do not continue with sitz baths if your symptoms get worse.  You have new symptoms. Do not continue with sitz baths until you talk with your health care provider. This information is not intended to replace advice given to you by your health  care provider. Make sure you discuss any questions you have with your health care provider. Document Released: 11/07/2003 Document Revised: 07/15/2015 Document Reviewed: 02/12/2014 Elsevier Interactive Patient Education  2018 [employer].   IF you received an x-ray today, you will receive an invoice from [facility]. Please contact [facility] at [phone] with questions or concerns regarding your invoice.   IF you received labwork today, you will receive an invoice from [facility]. Please contact [facility] at [phone] with questions or concerns regarding your invoice.   Our billing staff will not be able to assist you with questions regarding bills from these companies.  You will be contacted with the lab results as soon as they are available. The fastest way to get your results is to activate your My Chart account. Instructions are located on the last page of this paperwork. If you have not heard from [place] regarding the results in 2 weeks, please contact this office.

## 2017-10-07 NOTE — Progress Notes (Signed)
 Madison Hopkins  MRN: [id number] DOB: 05/16/1955  PCP: Wiseman, Brittany D, PA-C  Subjective:  Pt is a pleasant 62 year old female who presents to clinic for multiple complaints.   1) dysuria x 1 week. Feels "tugging" in lower abdominal area with dysuria. She took otc azo last night. Denies fever, chills, n/v, back pain, flank pain, diaphoresis. Of note she also has genital herpes outbreak currently.   2) Diarrhea x 1 week. endorses 2-3 loose bowel movements per day.  They are not watery, but are large volume. She has taken Imodium. Suspects this is side effect of chemo treatment.   3) Hemorrhoids. She has tried over the counter Tucks pads, and Preparation H without relief. She was Rx anusol by oncologist yesterday with no relief.   Of note she is currently being treated for HER-2 positive breast cancer with neoadjuvant chemotherapy with carboplatin, docetaxel, trastuzumab, and pertuzumab She was seen by oncologist yesterday and advised to con't Questran and Loperamide and to increase fluid intake after diarrhea episodes. Rx for anusol cream for hemorrhoid.   Review of Systems  Constitutional: Negative for chills, fatigue and fever.  Respiratory: Negative for cough, shortness of breath and wheezing.   Cardiovascular: Negative for chest pain and palpitations.  Gastrointestinal: Positive for abdominal pain, diarrhea and rectal pain. Negative for anal bleeding, blood in stool, nausea and vomiting.  Genitourinary: Positive for dysuria, frequency and pelvic pain. Negative for decreased urine volume, difficulty urinating, enuresis, flank pain, hematuria and urgency.  Musculoskeletal: Negative for back pain.  Skin: Positive for rash.  Neurological: Negative for dizziness, weakness, light-headedness and headaches.    Patient Active Problem List   Diagnosis Date Noted  . Malignant neoplasm of upper-outer quadrant of left breast in female, estrogen receptor positive (HCC) 08/10/2017     Current Outpatient Medications on File Prior to Visit  Medication Sig Dispense Refill  . acyclovir ointment (ZOVIRAX) 5 % Apply 1 application topically every 4 (four) hours. 15 g 2  . Artificial Tear Solution (OPTI-TEARS OP) Place 1 drop into both eyes 3 (three) times daily as needed (for dry/irritated eyes.).    . cholestyramine (QUESTRAN) 4 GM/DOSE powder Take 1 packet (4 g total) by mouth 2 (two) times daily with a meal. 378 g 2  . dexamethasone (DECADRON) 4 MG tablet Take 2 tablets (8 mg total) by mouth 2 (two) times daily. Start the day before Taxotere. Take once the day after, then 2 times a day x 2d. 30 tablet 1  . fluconazole (DIFLUCAN) 100 MG tablet TAKE DAILY FOR 5 DAYS STARTING ON CHEMO DAY 30 tablet 0  . fluticasone (FLONASE) 50 MCG/ACT nasal spray Place 1 spray into both nostrils daily as needed for allergies.    . hydrocortisone (ANUSOL-HC) 25 MG suppository Place 1 suppository (25 mg total) rectally 2 (two) times daily. 12 suppository 0  . lidocaine-prilocaine (EMLA) cream Apply to affected area once 30 g 3  . loperamide (IMODIUM) 2 MG capsule Take 1 capsule (2 mg total) by mouth as needed for diarrhea or loose stools. 30 capsule 0  . LORazepam (ATIVAN) 0.5 MG tablet TAKE 1 TABLET (0.5 MG TOTAL) BY MOUTH AT BEDTIME AS NEEDED (NAUSEA OR VOMITING). 30 tablet 1  . Misc Natural Products (OSTEO BI-FLEX ADV TRIPLE ST PO) Take 1 tablet by mouth daily after lunch.    . Multiple Vitamin (MULTIVITAMIN WITH MINERALS) TABS tablet Take 1 tablet by mouth daily after lunch.    . naproxen sodium (ALEVE) 220   MG tablet Take 220 mg by mouth 2 (two) times daily as needed (FOR PAIN.).     Marland Kitchen Nutritional Supplements (IMMUNE ENHANCE) TABS Take 1 tablet by mouth daily after lunch.    . prochlorperazine (COMPAZINE) 10 MG tablet Take 1 tablet (10 mg total) by mouth every 6 (six) hours as needed (Nausea or vomiting). 30 tablet 1  . tetrahydrozoline (VISINE) 0.05 % ophthalmic solution Place 1 drop into both  eyes 3 (three) times daily as needed (for dry eyes.).    Marland Kitchen valACYclovir (VALTREX) 500 MG tablet Take 1 tablet (500 mg total) by mouth 2 (two) times daily. 60 tablet 2   Current Facility-Administered Medications on File Prior to Visit  Medication Dose Route Frequency Provider Last Rate Last Dose  . 0.9 %  sodium chloride infusion   Intravenous Continuous Gardenia Phlegm, NP 500 mL/hr at 10/06/17 1650    . sodium chloride flush (NS) 0.9 % injection 10 mL  10 mL Intracatheter PRN [name], [name] Dad, MD   10 mL at 10/06/17 1411    Allergies  Allergen Reactions  . Penicillins Nausea Only and Other (See Comments)    Has patient had a PCN reaction causing immediate rash, facial/tongue/throat swelling, SOB or lightheadedness with hypotension: No Has patient had a PCN reaction causing severe rash involving mucus membranes or skin necrosis: No Has patient had a PCN reaction that required hospitalization: No Has patient had a PCN reaction occurring within the last 10 years: Yes--nausea & headache ONLY If all of the above answers are "NO", then may proceed with Cephalosporin use.      Objective:  BP 127/78   Pulse 73   Temp 98.3 F (36.8 C) (Oral)   Resp 18   Ht 5' 8.19" (1.732 m)   Wt 187 lb (84.8 kg)   LMP  (LMP Unknown)   SpO2 98%   BMI 28.28 kg/m   Physical Exam  Constitutional: She is oriented to person, place, and time. No distress.  Genitourinary:     Neurological: She is alert and oriented to person, place, and time.  Skin: Skin is warm and dry.  Psychiatric: Judgment normal.  Vitals reviewed.  Results for orders placed or performed in visit on 10/07/17  POCT urinalysis dipstick  Result Value Ref Range   Color, UA orange (A) yellow   Clarity, UA clear clear   Glucose, UA =100 (A) negative mg/dL   Bilirubin, UA negative negative   Ketones, POC UA negative negative mg/dL   Spec Grav, UA 1.010 1.010 - 1.025   Blood, UA trace-intact (A) negative   pH, UA 6.0 5.0  - 8.0   Protein Ur, POC =30 (A) negative mg/dL   Urobilinogen, UA 1.0 0.2 or 1.0 E.U./dL   Nitrite, UA Positive (A) Negative   Leukocytes, UA Negative Negative  POCT Microscopic Urinalysis ([facility])  Result Value Ref Range   WBC,UR,HPF,POC Few (A) None WBC/hpf   RBC,UR,HPF,POC None None RBC/hpf   Bacteria None None, Too numerous to count   Mucus Absent Absent   Epithelial Cells, UR Per Microscopy Few (A) None, Too numerous to count cells/hpf    Procedure: Verbal consent obtained. Skin was cleaned with soap and water and anesthetized with lidocaine. A 1 cm elliptical incision was made. A moderate amount of thrombosed material expressed. Wound wound care discussed.  Assessment and Plan :  1. External hemorrhoid, thrombosed - External hemorrhoid excised today without difficulty. pt endorses immediate relief. Wound care discussed - advised stiz baths.  RTC PRN.   2. Acute cystitis with hematuria 3. Dysuria - UA suspicious for UTI, no suspicion for pyelonephritis - will cover for simple UTI with Macrobid. Suspect UTI 2/2 diarrhea, which is likely 2/2 chemo treatment. Currently closely followed by oncologist. hygiene discussed. RTC in 3-5 days if no improvement.  - nitrofurantoin, macrocrystal-monohydrate, (MACROBID) 100 MG capsule; Take 1 capsule (100 mg total) by mouth 2 (two) times daily.  Dispense: 20 capsule; Refill: 0 - POCT urinalysis dipstick - POCT Microscopic Urinalysis ([facility]) - Urine Culture   Whitney , PA-C  [facility] 10/07/2017 9:31 AM  Please note: Portions of this report may have been transcribed using dragon voice recognition software. Every effort was made to ensure accuracy; however, inadvertent computerized transcription errors may be present. 

## 2017-10-08 LAB — URINE CULTURE

## 2017-10-09 ENCOUNTER — Other Ambulatory Visit: Payer: Self-pay | Admitting: Oncology

## 2017-10-10 ENCOUNTER — Telehealth: Payer: Self-pay | Admitting: Adult Health

## 2017-10-10 NOTE — Telephone Encounter (Signed)
Per 8/9 no los 

## 2017-10-19 ENCOUNTER — Encounter: Payer: Self-pay | Admitting: Adult Health

## 2017-10-19 ENCOUNTER — Inpatient Hospital Stay: Payer: 59

## 2017-10-19 ENCOUNTER — Inpatient Hospital Stay: Payer: 59 | Admitting: Adult Health

## 2017-10-19 ENCOUNTER — Inpatient Hospital Stay: Payer: 59 | Admitting: Nutrition

## 2017-10-19 ENCOUNTER — Encounter: Payer: Self-pay | Admitting: *Deleted

## 2017-10-19 VITALS — BP 134/75 | HR 70 | Temp 98.5°F | Resp 18 | Wt 193.4 lb

## 2017-10-19 DIAGNOSIS — C50412 Malignant neoplasm of upper-outer quadrant of left female breast: Secondary | ICD-10-CM

## 2017-10-19 DIAGNOSIS — C773 Secondary and unspecified malignant neoplasm of axilla and upper limb lymph nodes: Secondary | ICD-10-CM | POA: Diagnosis not present

## 2017-10-19 DIAGNOSIS — Z5111 Encounter for antineoplastic chemotherapy: Secondary | ICD-10-CM | POA: Diagnosis not present

## 2017-10-19 DIAGNOSIS — Z170 Estrogen receptor positive status [ER+]: Principal | ICD-10-CM

## 2017-10-19 DIAGNOSIS — Z95828 Presence of other vascular implants and grafts: Secondary | ICD-10-CM

## 2017-10-19 LAB — COMPREHENSIVE METABOLIC PANEL
ALT: 27 U/L (ref 0–44)
AST: 16 U/L (ref 15–41)
Albumin: 3.7 g/dL (ref 3.5–5.0)
Alkaline Phosphatase: 163 U/L — ABNORMAL HIGH (ref 38–126)
Anion gap: 8 (ref 5–15)
BUN: 8 mg/dL (ref 8–23)
CO2: 25 mmol/L (ref 22–32)
Calcium: 9.6 mg/dL (ref 8.9–10.3)
Chloride: 109 mmol/L (ref 98–111)
Creatinine, Ser: 0.79 mg/dL (ref 0.44–1.00)
GFR calc Af Amer: >60 mL/min (ref >60)
GFR calc non Af Amer: >60 mL/min (ref >60)
Glucose, Bld: 117 mg/dL — ABNORMAL HIGH (ref 70–99)
Potassium: 3.6 mmol/L (ref 3.5–5.1)
Sodium: 142 mmol/L (ref 135–145)
Total Bilirubin: 0.3 mg/dL (ref 0.3–1.2)
Total Protein: 6.9 g/dL (ref 6.5–8.1)

## 2017-10-19 LAB — CBC WITH DIFFERENTIAL/PLATELET
Basophils Absolute: 0 10*3/uL (ref 0.0–0.1)
Basophils Relative: 0 %
Eosinophils Absolute: 0.1 10*3/uL (ref 0.0–0.5)
Eosinophils Relative: 2 %
HCT: 32.6 % — ABNORMAL LOW (ref 34.8–46.6)
Hemoglobin: 10.1 g/dL — ABNORMAL LOW (ref 11.6–15.9)
Lymphocytes Relative: 39 %
Lymphs Abs: 1.9 10*3/uL (ref 0.9–3.3)
MCH: 26.6 pg (ref 25.1–34.0)
MCHC: 31 g/dL — ABNORMAL LOW (ref 31.5–36.0)
MCV: 86 fL (ref 79.5–101.0)
Monocytes Absolute: 0.4 10*3/uL (ref 0.1–0.9)
Monocytes Relative: 9 %
Neutro Abs: 2.4 10*3/uL (ref 1.5–6.5)
Neutrophils Relative %: 50 %
Platelets: 248 10*3/uL (ref 145–400)
RBC: 3.79 MIL/uL (ref 3.70–5.45)
RDW: 17.9 % — ABNORMAL HIGH (ref 11.2–14.5)
WBC: 4.9 10*3/uL (ref 3.9–10.3)

## 2017-10-19 MED ORDER — TRASTUZUMAB CHEMO 150 MG IV SOLR
6.0000 mg/kg | Freq: Once | INTRAVENOUS | Status: AC
  Administered 2017-10-19: 546 mg via INTRAVENOUS
  Filled 2017-10-19: qty 26

## 2017-10-19 MED ORDER — DIPHENHYDRAMINE HCL 25 MG PO CAPS
ORAL_CAPSULE | ORAL | Status: AC
  Filled 2017-10-19: qty 1

## 2017-10-19 MED ORDER — PALONOSETRON HCL INJECTION 0.25 MG/5ML
INTRAVENOUS | Status: AC
  Filled 2017-10-19: qty 5

## 2017-10-19 MED ORDER — PALONOSETRON HCL INJECTION 0.25 MG/5ML
0.2500 mg | Freq: Once | INTRAVENOUS | Status: AC
  Administered 2017-10-19: 0.25 mg via INTRAVENOUS

## 2017-10-19 MED ORDER — ACETAMINOPHEN 325 MG PO TABS
650.0000 mg | ORAL_TABLET | Freq: Once | ORAL | Status: AC
  Administered 2017-10-19: 650 mg via ORAL

## 2017-10-19 MED ORDER — SODIUM CHLORIDE 0.9 % IV SOLN
Freq: Once | INTRAVENOUS | Status: AC
  Administered 2017-10-19: 12:00:00 via INTRAVENOUS
  Filled 2017-10-19: qty 250

## 2017-10-19 MED ORDER — SODIUM CHLORIDE 0.9% FLUSH
10.0000 mL | INTRAVENOUS | Status: DC | PRN
  Administered 2017-10-19: 10 mL
  Filled 2017-10-19: qty 10

## 2017-10-19 MED ORDER — SODIUM CHLORIDE 0.9 % IV SOLN
Freq: Once | INTRAVENOUS | Status: AC
  Administered 2017-10-19: 13:00:00 via INTRAVENOUS
  Filled 2017-10-19: qty 5

## 2017-10-19 MED ORDER — SODIUM CHLORIDE 0.9% FLUSH
10.0000 mL | INTRAVENOUS | Status: DC | PRN
  Administered 2017-10-19: 10 mL via INTRAVENOUS
  Filled 2017-10-19: qty 10

## 2017-10-19 MED ORDER — SODIUM CHLORIDE 0.9 % IV SOLN
75.0000 mg/m2 | Freq: Once | INTRAVENOUS | Status: AC
  Administered 2017-10-19: 160 mg via INTRAVENOUS
  Filled 2017-10-19: qty 16

## 2017-10-19 MED ORDER — SODIUM CHLORIDE 0.9 % IV SOLN
630.0000 mg | Freq: Once | INTRAVENOUS | Status: AC
  Administered 2017-10-19: 630 mg via INTRAVENOUS
  Filled 2017-10-19: qty 63

## 2017-10-19 MED ORDER — HEPARIN SOD (PORK) LOCK FLUSH 100 UNIT/ML IV SOLN
500.0000 [IU] | Freq: Once | INTRAVENOUS | Status: AC | PRN
  Administered 2017-10-19: 500 [IU]
  Filled 2017-10-19: qty 5

## 2017-10-19 MED ORDER — ACETAMINOPHEN 325 MG PO TABS
ORAL_TABLET | ORAL | Status: AC
  Filled 2017-10-19: qty 2

## 2017-10-19 MED ORDER — PEGFILGRASTIM 6 MG/0.6ML ~~LOC~~ PSKT
PREFILLED_SYRINGE | SUBCUTANEOUS | Status: AC
  Filled 2017-10-19: qty 0.6

## 2017-10-19 MED ORDER — PEGFILGRASTIM 6 MG/0.6ML ~~LOC~~ PSKT
6.0000 mg | PREFILLED_SYRINGE | Freq: Once | SUBCUTANEOUS | Status: AC
  Administered 2017-10-19: 6 mg via SUBCUTANEOUS

## 2017-10-19 MED ORDER — DIPHENHYDRAMINE HCL 25 MG PO CAPS
25.0000 mg | ORAL_CAPSULE | Freq: Once | ORAL | Status: AC
  Administered 2017-10-19: 25 mg via ORAL

## 2017-10-19 NOTE — Progress Notes (Signed)
62 year old female diagnosed with breast cancer.  She is a patient of Dr. Jana Hakim.  Past medical history includes STD, vitamin D deficiency, increased hemoglobin A1c.  Medications include Questran, Decadron, Imodium, Ativan, multivitamin, and Compazine.  Labs were reviewed.  Height: 5 feet 8 inches. Weight: 193 pounds. Usual body weight: 199 pounds in July 2019. BMI: 29.24.  Patient reports she has decreased appetite after she receives treatment.  This usually resolves in 2 to 3 days. Patient has tried to increase plant-based foods.   She has recently gained 6 pounds. No other nutrition impact symptoms mentioned.  Nutrition diagnosis: Food and nutrition related knowledge deficit related to breast cancer as evidenced by no prior need for nutrition related information.  Intervention: Patient educated to continue plant-based diet with increased protein foods. Encourage small amounts of food more often especially right after chemotherapy. Reviewed strategies for improving diarrhea after treatment. Fact sheets were given.  Contact information provided.  Questions and answers were given.  Monitoring, evaluation, goals: Patient will tolerate increased calories and protein to minimize weight loss.  She will continue to consume a healthy plant-based diet.  No follow-up required patient has my contact information for questions.  **Disclaimer: This note was dictated with voice recognition software. Similar sounding words can inadvertently be transcribed and this note may contain transcription errors which may not have been corrected upon publication of note.**

## 2017-10-19 NOTE — Patient Instructions (Signed)
[facility] Discharge Instructions for Patients Receiving Chemotherapy  Today you received the following chemotherapy agents:  Herceptin, Taxotere, Carboplatin  To help prevent nausea and vomiting after your treatment, we encourage you to take your nausea medication as prescribed.   If you develop nausea and vomiting that is not controlled by your nausea medication, call the clinic.   BELOW ARE SYMPTOMS THAT SHOULD BE REPORTED IMMEDIATELY:  *FEVER GREATER THAN 100.5 F  *CHILLS WITH OR WITHOUT FEVER  NAUSEA AND VOMITING THAT IS NOT CONTROLLED WITH YOUR NAUSEA MEDICATION  *UNUSUAL SHORTNESS OF BREATH  *UNUSUAL BRUISING OR BLEEDING  TENDERNESS IN MOUTH AND THROAT WITH OR WITHOUT PRESENCE OF ULCERS  *URINARY PROBLEMS  *BOWEL PROBLEMS  UNUSUAL RASH Items with * indicate a potential emergency and should be followed up as soon as possible.  Feel free to call the clinic should you have any questions or concerns. The clinic phone number is [phone].  Please show the CHEMO ALERT CARD at check-in to the Emergency Department and triage nurse.   

## 2017-10-19 NOTE — Progress Notes (Addendum)
Madison Hopkins  [phone] [phone]     ID: [name] DOB: [birth year]  MR#: [id number]  RXV#:[id number]  Patient Care Team: Donzetta Kohut as PCP - General (Physician Assistant) [name], MD as Consulting Physician (General Surgery) [name], [name] Dad, MD as Consulting Physician (Oncology) Kyung Rudd, MD as Consulting Physician (Radiation Oncology) [name], [name] All, MD as Consulting Physician (Obstetrics and Gynecology) Gentry Fitz, MD as Consulting Physician (Family Medicine) OTHER MD:  CHIEF COMPLAINT: HER-2 positive breast cancer  CURRENT TREATMENT: Neoadjuvant chemotherapy  INTERVAL HISTORY: Madison Hopkins returns today for follow-up of her HER-2 positive, weakly estrogen receptor positive breast cancer accompanied by her brother, son and daughter. She is receiving neoadjuvant chemotherapy with carboplatin, docetaxel, trastuzumab, and pertuzumab. Today is day 1 cycle 3.   REVIEW OF SYSTEMS: [name] is doing moderately well.  She went to her PCP and underwent evaluation for her irritation. She had a UTI, her hemorrhoids were lanced, and her herpes had flared, and had become infected.  She received antibiotics and improved greatly. Since then she has been feeling well.  She has not had any fevers or chills.  She has been working and has no issues.    [name] would like to have a Museum/gallery conservator.  She would like to meet with Dr. Dalbert Batman.  She tells me that her spirit feels unsettled about Dr. Lucia Gaskins.  Heike is doing well otherwise.  A detailed ROS was conducted and was completely non contributory.     HISTORY OF CURRENT ILLNESS: From the original intake note:  Madison Hopkins noted a mass in the left axilla sometime in January or February 2019.  She eventually brought her to her gynecologist's attention, and underwent bilateral diagnostic mammography with tomography and left breast ultrasonography at [facility] on  08/04/2017 showing: breast density category B. There is a highly suspicious hypoechoic mass in the left breast at the 2 o'clock upper outer quadrant measuring 2.3 x 1.6 x 2.2 cm, located 2 cm from the nipple. Sonographically, there were 2 enlarged lymph nodes in the left axilla, the largest with cortical thickening measuring 2.5 cm. No evidence of malignancy was seen in the right breast.   Accordingly on 08/07/2017 she proceeded to biopsy of the left breast area and 1 of the lymph nodes in question. The pathology from this procedure showed ([id number]): Invasive ductal carcinoma, grade 3. Metastatic carcinoma was found in one left axillary lymph node. Prognostic indicators significant for: estrogen receptor, 30% positive with weak staining intensity and progesterone receptor, 0% negative. Proliferation marker Ki67 at 80%. HER2 amplified with ratios HER2/CEP17 signals 2.32 and average HER2 copies per cell 6.60  The patient's subsequent history is as detailed below.   PAST MEDICAL HISTORY: Past Medical History:  Diagnosis Date  . Abnormal Pap smear of vagina 07/28/2016   LGSIL; colpo 07/2016 atrophic squamous cells; colpo 07/2017 - atypia  . Allergy   . Breast cancer ([place])    Metastatic Left breast  . Elevated hemoglobin A1c 07/28/2016   level - 6.1  . Endometriosis   . Low vitamin D level 07/28/2016   level 24.7  . STD (sexually transmitted disease)   GERD but no ulcers  PAST SURGICAL HISTORY: Past Surgical History:  Procedure Laterality Date  . ABDOMINAL HYSTERECTOMY    . CESAREAN SECTION    . COLON SURGERY    . OOPHORECTOMY    . PORTACATH PLACEMENT N/A 09/06/2017   Procedure: INSERTION PORT-A-CATH;  Surgeon: [name], MD;  Location: [place];  Service: General;  Laterality: N/A;  . SMALL INTESTINE SURGERY    . SPINE SURGERY    Hysterectomy with salpingo-oophorectomy, Tonsillectomy, Plantar Fascitis Right Foot Surgery    FAMILY HISTORY Family History  Problem Relation Age of  Onset  . Mental illness Mother   . Alcoholism Mother   . Cirrhosis Mother   . Cancer Father   . Lung cancer Father   The patient' father died at age 24 due to lung cancer (heavy smoker). The patient's mother died due to liver cirrhosis (heavy drinker). The patient has 2 brothers and no sisters. There was a paternal 1st cousin with colon cancer diagnosed in the mid 40's, who also had cervical cancer. The mother of this 1st cousin (the patient's paternal aunt) had cancer (the patient's is unsure of what type). There was also a paternal uncle with prostate cancer diagnosed in the 9's. The patient denies a family history of breast or ovarian cancer.     GYNECOLOGIC HISTORY:  No LMP recorded (lmp unknown). Patient has had a hysterectomy. Menarche: 62 years old Age at first live birth: 62 years old She is GXP2.  The patient is status post total hysterectomy with bilateral salpingo-oophorectomy in 1995.  She never used contraception. She notes that she had an estrogen shot one time but no other HRTs.    SOCIAL HISTORY:  The patient works in Therapist, art for the tax department. She is single. At home is herself and no pets. Her son, Madison Hopkins is age 62 and lives in [place], [place] as a Musician. The patient's daughter Madison Hopkins is age 62 and lives in [place] in Therapist, art for [employer]. The patient has 5 grandchildren and 1 great grandchild. She attends [employer].     ADVANCED DIRECTIVES: Not in place; at the 08/16/2017 visit the patient was given the appropriate forms to complete on notarized at her discretion   HEALTH MAINTENANCE: Social History   Tobacco Use  . Smoking status: Never Smoker  . Smokeless tobacco: Never Used  Substance Use Topics  . Alcohol use: Yes    Alcohol/week: 0.0 standard drinks    Comment: occasional  . Drug use: Yes    Types: Marijuana     Colonoscopy: 2009?  PAP: 07/31/2017/ positive for Atypical Squamous cells of uncertain significance.    Bone density: 10/24/2016 showed a T score of -0.7 (normal was (   Allergies  Allergen Reactions  . Penicillins Nausea Only and Other (See Comments)    Has patient had a PCN reaction causing immediate rash, facial/tongue/throat swelling, SOB or lightheadedness with hypotension: No Has patient had a PCN reaction causing severe rash involving mucus membranes or skin necrosis: No Has patient had a PCN reaction that required hospitalization: No Has patient had a PCN reaction occurring within the last 10 years: Yes--nausea & headache ONLY If all of the above answers are "NO", then may proceed with Cephalosporin use.     Current Outpatient Medications  Medication Sig Dispense Refill  . acyclovir ointment (ZOVIRAX) 5 % Apply 1 application topically every 4 (four) hours. 15 g 2  . Artificial Tear Solution (OPTI-TEARS OP) Place 1 drop into both eyes 3 (three) times daily as needed (for dry/irritated eyes.).    Marland Kitchen cholestyramine (QUESTRAN) 4 GM/DOSE powder Take 1 packet (4 g total) by mouth 2 (two) times daily with a meal. 378 g 2  . dexamethasone (DECADRON) 4 MG tablet Take 2  tablets (8 mg total) by mouth 2 (two) times daily. Start the day before Taxotere. Take once the day after, then 2 times a day x 2d. 30 tablet 1  . fluconazole (DIFLUCAN) 100 MG tablet TAKE DAILY FOR 5 DAYS STARTING ON CHEMO DAY 30 tablet 0  . fluticasone (FLONASE) 50 MCG/ACT nasal spray Place 1 spray into both nostrils daily as needed for allergies.    . hydrocortisone (ANUSOL-HC) 25 MG suppository Place 1 suppository (25 mg total) rectally 2 (two) times daily. 12 suppository 0  . lidocaine-prilocaine (EMLA) cream Apply to affected area once 30 g 3  . loperamide (IMODIUM) 2 MG capsule Take 1 capsule (2 mg total) by mouth as needed for diarrhea or loose stools. 30 capsule 0  . LORazepam (ATIVAN) 0.5 MG tablet TAKE 1 TABLET (0.5 MG TOTAL) BY MOUTH AT BEDTIME AS NEEDED (NAUSEA OR VOMITING). 30 tablet 1  . Misc Natural Products  (OSTEO BI-FLEX ADV TRIPLE ST PO) Take 1 tablet by mouth daily after lunch.    . Multiple Vitamin (MULTIVITAMIN WITH MINERALS) TABS tablet Take 1 tablet by mouth daily after lunch.    . naproxen sodium (ALEVE) 220 MG tablet Take 220 mg by mouth 2 (two) times daily as needed (FOR PAIN.).     Marland Kitchen nitrofurantoin, macrocrystal-monohydrate, (MACROBID) 100 MG capsule Take 1 capsule (100 mg total) by mouth 2 (two) times daily. 20 capsule 0  . Nutritional Supplements (IMMUNE ENHANCE) TABS Take 1 tablet by mouth daily after lunch.    . prochlorperazine (COMPAZINE) 10 MG tablet Take 1 tablet (10 mg total) by mouth every 6 (six) hours as needed (Nausea or vomiting). 30 tablet 1  . tetrahydrozoline (VISINE) 0.05 % ophthalmic solution Place 1 drop into both eyes 3 (three) times daily as needed (for dry eyes.).    Marland Kitchen valACYclovir (VALTREX) 500 MG tablet Take 1 tablet (500 mg total) by mouth 2 (two) times daily. 60 tablet 2   No current facility-administered medications for this visit.     OBJECTIVE:  Vitals:   10/19/17 1011  BP: 134/75  Pulse: 70  Resp: 18  Temp: 98.5 F (36.9 C)  SpO2: 100%     Body mass index is 29.24 kg/m.   Wt Readings from Last 3 Encounters:  10/19/17 193 lb 6 oz (87.7 kg)  10/07/17 187 lb (84.8 kg)  10/06/17 187 lb (84.8 kg)  ECOG FS:1 - Symptomatic but completely ambulatory GENERAL: Patient is a well appearing female in no acute distress HEENT:  Sclerae anicteric.  Oropharynx clear and moist. No ulcerations or evidence of oropharyngeal candidiasis. Neck is supple.  NODES:  No cervical, supraclavicular, or axillary lymphadenopathy palpated.  BREAST EXAM:  Deferred. LUNGS:  Clear to auscultation bilaterally.  No wheezes or rhonchi. HEART:  Regular rate and rhythm. No murmur appreciated. ABDOMEN:  Soft, nontender.  Positive, normoactive bowel sounds. No organomegaly palpated. MSK:  No focal spinal tenderness to palpation. Full range of motion bilaterally in the upper  extremities. EXTREMITIES:  No peripheral edema.   SKIN:  Clear with no obvious rashes or skin changes. No nail dyscrasia. NEURO:  Nonfocal. Well oriented.  Appropriate affect.    LAB RESULTS:  CMP     Component Value Date/Time   NA 135 10/06/2017 1305   K 3.6 10/06/2017 1305   CL 101 10/06/2017 1305   CO2 24 10/06/2017 1305   GLUCOSE 96 10/06/2017 1305   BUN 10 10/06/2017 1305   CREATININE 0.93 10/06/2017 1305   CREATININE 0.97  08/16/2017 0804   CALCIUM 9.7 10/06/2017 1305   PROT 7.6 10/06/2017 1305   ALBUMIN 4.1 10/06/2017 1305   AST 21 10/06/2017 1305   AST 19 08/16/2017 0804   ALT 38 10/06/2017 1305   ALT 20 08/16/2017 0804   ALKPHOS 179 (H) 10/06/2017 1305   BILITOT 0.5 10/06/2017 1305   BILITOT 0.6 08/16/2017 0804   GFRNONAA >60 10/06/2017 1305   GFRNONAA >60 08/16/2017 0804   GFRAA >60 10/06/2017 1305   GFRAA >60 08/16/2017 0804    No results found for: TOTALPROTELP, ALBUMINELP, A1GS, A2GS, BETS, BETA2SER, GAMS, MSPIKE, SPEI  No results found for: KPAFRELGTCHN, LAMBDASER, KAPLAMBRATIO  Lab Results  Component Value Date   WBC 4.9 10/19/2017   NEUTROABS 2.4 10/19/2017   HGB 10.1 (L) 10/19/2017   HCT 32.6 (L) 10/19/2017   MCV 86.0 10/19/2017   PLT 248 10/19/2017    '@LASTCHEMISTRY'$ @  No results found for: LABCA2  No components found for: [id number]  No results for input(s): INR in the last 168 hours.  No results found for: LABCA2  No results found for: TGG269  No results found for: SWN462  No results found for: VOJ500  No results found for: CA2729  No components found for: HGQUANT  No results found for: CEA1 / No results found for: CEA1   No results found for: AFPTUMOR  No results found for: [place]  No results found for: PSA1  Appointment on 10/19/2017  Component Date Value Ref Range Status  . WBC 10/19/2017 4.9  3.9 - 10.3 K/uL Final  . RBC 10/19/2017 3.79  3.70 - 5.45 MIL/uL Final  . Hemoglobin 10/19/2017 10.1* 11.6 - 15.9 g/dL  Final  . HCT 10/19/2017 32.6* 34.8 - 46.6 % Final  . MCV 10/19/2017 86.0  79.5 - 101.0 fL Final  . MCH 10/19/2017 26.6  25.1 - 34.0 pg Final  . MCHC 10/19/2017 31.0* 31.5 - 36.0 g/dL Final  . RDW 10/19/2017 17.9* 11.2 - 14.5 % Final  . Platelets 10/19/2017 248  145 - 400 K/uL Final  . Neutrophils Relative % 10/19/2017 50  % Final  . Neutro Abs 10/19/2017 2.4  1.5 - 6.5 K/uL Final  . Lymphocytes Relative 10/19/2017 39  % Final  . Lymphs Abs 10/19/2017 1.9  0.9 - 3.3 K/uL Final  . Monocytes Relative 10/19/2017 9  % Final  . Monocytes Absolute 10/19/2017 0.4  0.1 - 0.9 K/uL Final  . Eosinophils Relative 10/19/2017 2  % Final  . Eosinophils Absolute 10/19/2017 0.1  0.0 - 0.5 K/uL Final  . Basophils Relative 10/19/2017 0  % Final  . Basophils Absolute 10/19/2017 0.0  0.0 - 0.1 K/uL Final   Performed at [facility], [address] [place], [place] [address]    (this displays the last labs from the last 3 days)  No results found for: TOTALPROTELP, ALBUMINELP, A1GS, A2GS, BETS, BETA2SER, GAMS, MSPIKE, SPEI (this displays SPEP labs)  No results found for: KPAFRELGTCHN, LAMBDASER, KAPLAMBRATIO (kappa/lambda light chains)  No results found for: HGBA, HGBA2QUANT, HGBFQUANT, HGBSQUAN (Hemoglobinopathy evaluation)   No results found for: LDH  No results found for: IRON, TIBC, IRONPCTSAT (Iron and TIBC)  No results found for: FERRITIN  Urinalysis    Component Value Date/Time   BILIRUBINUR negative 10/07/2017 0939   BILIRUBINUR n 08/25/2016 1407   KETONESUR negative 10/07/2017 0939   PROTEINUR =30 (A) 10/07/2017 0939   PROTEINUR n 08/25/2016 1407   UROBILINOGEN 1.0 10/07/2017 0939   NITRITE Positive (A)  10/07/2017 0939   NITRITE n 08/25/2016 1407   LEUKOCYTESUR Negative 10/07/2017 0939     STUDIES: No results found.  ELIGIBLE FOR AVAILABLE RESEARCH PROTOCOL: yes (see Research Studies)  ASSESSMENT: 62 y.o. [place], [place] woman status post left breast  upper outer quadrant and left axillary lymph node biopsy 08/07/2017, both positive for a T2 N1, stage IIB invasive ductal carcinoma, grade 3, estrogen receptor weakly positive, progesterone receptor negative, but HER-2 amplified, with an MIB-1 of 80%  (a) staging chest CT and bone scan 08/30/2017 showed no evidence of metastatic disease  (1) neoadjuvant chemotherapy will consist of carboplatin, docetaxel, trastuzumab, and Pertuzumab given every 21 days x 6 starting 09/07/2017, perjeta omitted with cycle 2 and 3 due to diarrhea  (2) trastuzumab will be continued to complete 6 months  (a) echocardiogram 08/28/2017 showed an ejection fraction in the 60-65% range.  (3) definitive surgery to follow  (4) adjuvant radiation to follow surgery  (5) will start antiestrogens at the completion of local therapy  PLAN:  Ferris is doing well today.  She is ready to proceed with her third cycle of chemotherapy.  She feels much better having had her hemorrhoid excised.  I reviewed her CBC with her in detail which is normal.  She has not had any peripheral neuropathy at this point. She has [place] and imodium if needed for diarrhea.    I spoke briefly with our nurse navigator Guy who will get a new patient appointment set up with Dr. Dalbert Batman.   Aram Beecham will return in one week for labs and f/u.  She knows to call for any other issues that may develop before the next visit.   A total of (20) minutes of face-to-face time was spent with this patient with greater than 50% of that time in counseling and care-coordination.   Wilber Bihari, NP  10/19/17 10:15 AM Medical Oncology and Hematology [facility] [address] [place], [place] [address] Tel. [phone]    Fax. [phone]

## 2017-10-25 ENCOUNTER — Other Ambulatory Visit: Payer: Self-pay | Admitting: Adult Health

## 2017-10-25 ENCOUNTER — Telehealth: Payer: Self-pay

## 2017-10-25 ENCOUNTER — Inpatient Hospital Stay: Payer: 59 | Admitting: Medical

## 2017-10-25 ENCOUNTER — Inpatient Hospital Stay: Payer: 59

## 2017-10-25 VITALS — BP 119/78 | HR 88 | Temp 98.2°F | Resp 18 | Ht 68.19 in | Wt 186.8 lb

## 2017-10-25 DIAGNOSIS — Z170 Estrogen receptor positive status [ER+]: Principal | ICD-10-CM

## 2017-10-25 DIAGNOSIS — R110 Nausea: Secondary | ICD-10-CM

## 2017-10-25 DIAGNOSIS — M2550 Pain in unspecified joint: Secondary | ICD-10-CM | POA: Diagnosis not present

## 2017-10-25 DIAGNOSIS — C50412 Malignant neoplasm of upper-outer quadrant of left female breast: Secondary | ICD-10-CM | POA: Diagnosis not present

## 2017-10-25 DIAGNOSIS — Z5111 Encounter for antineoplastic chemotherapy: Secondary | ICD-10-CM | POA: Diagnosis not present

## 2017-10-25 DIAGNOSIS — R630 Anorexia: Secondary | ICD-10-CM | POA: Diagnosis not present

## 2017-10-25 LAB — CMP (CANCER CENTER ONLY)
ALT: 30 U/L (ref 0–44)
AST: 23 U/L (ref 15–41)
Albumin: 3.9 g/dL (ref 3.5–5.0)
Alkaline Phosphatase: 182 U/L — ABNORMAL HIGH (ref 38–126)
Anion gap: 8 (ref 5–15)
BUN: 10 mg/dL (ref 8–23)
CO2: 26 mmol/L (ref 22–32)
Calcium: 9.4 mg/dL (ref 8.9–10.3)
Chloride: 101 mmol/L (ref 98–111)
Creatinine: 0.81 mg/dL (ref 0.44–1.00)
GFR, Est AFR Am: >60 mL/min (ref >60)
GFR, Estimated: >60 mL/min (ref >60)
Glucose, Bld: 94 mg/dL (ref 70–99)
Potassium: 3.6 mmol/L (ref 3.5–5.1)
Sodium: 135 mmol/L (ref 135–145)
Total Bilirubin: 0.6 mg/dL (ref 0.3–1.2)
Total Protein: 7.3 g/dL (ref 6.5–8.1)

## 2017-10-25 LAB — CBC WITH DIFFERENTIAL (CANCER CENTER ONLY)
Basophils Absolute: 0 10*3/uL (ref 0.0–0.1)
Basophils Relative: 1 %
Eosinophils Absolute: 0.1 10*3/uL (ref 0.0–0.5)
Eosinophils Relative: 1 %
HCT: 33.6 % — ABNORMAL LOW (ref 34.8–46.6)
Hemoglobin: 11 g/dL — ABNORMAL LOW (ref 11.6–15.9)
Lymphocytes Relative: 16 %
Lymphs Abs: 1.4 10*3/uL (ref 0.9–3.3)
MCH: 27 pg (ref 25.1–34.0)
MCHC: 32.7 g/dL (ref 31.5–36.0)
MCV: 82.6 fL (ref 79.5–101.0)
Monocytes Absolute: 0.7 10*3/uL (ref 0.1–0.9)
Monocytes Relative: 8 %
Neutro Abs: 6.7 10*3/uL — ABNORMAL HIGH (ref 1.5–6.5)
Neutrophils Relative %: 74 %
Platelet Count: 261 10*3/uL (ref 145–400)
RBC: 4.07 MIL/uL (ref 3.70–5.45)
RDW: 19.3 % — ABNORMAL HIGH (ref 11.2–14.5)
WBC Count: 8.9 10*3/uL (ref 3.9–10.3)

## 2017-10-25 MED ORDER — SODIUM CHLORIDE 0.9 % IV SOLN
Freq: Once | INTRAVENOUS | Status: AC
  Administered 2017-10-25: 11:00:00 via INTRAVENOUS
  Filled 2017-10-25: qty 250

## 2017-10-25 MED ORDER — HEPARIN SOD (PORK) LOCK FLUSH 100 UNIT/ML IV SOLN
500.0000 [IU] | Freq: Once | INTRAVENOUS | Status: AC
  Administered 2017-10-25: 500 [IU] via INTRAVENOUS
  Filled 2017-10-25: qty 5

## 2017-10-25 MED ORDER — HYDROCODONE-ACETAMINOPHEN 5-325 MG PO TABS: ORAL_TABLET | ORAL | 0 refills | Status: DC

## 2017-10-25 MED ORDER — SODIUM CHLORIDE 0.9 % IJ SOLN
10.0000 mL | Freq: Once | INTRAMUSCULAR | Status: AC
  Administered 2017-10-25: 10 mL via INTRAVENOUS
  Filled 2017-10-25: qty 10

## 2017-10-25 NOTE — Telephone Encounter (Signed)
Madison Hopkins.  This is the one I called you about.  Thanks so much for seeing her!!!

## 2017-10-25 NOTE — Telephone Encounter (Signed)
Patient called in to say she was not feeling well today and was on her way to center to see someone.  She has an appt with [place] on Friday but feels she is unable to wait until then.  LC ordered labs for today and addon lab and [facility] with [place] initiated.  Patient is aware.

## 2017-10-25 NOTE — Progress Notes (Signed)
 [facility] Progress Note   [name] [id number] 02/20/1956 62 y.o.  [name] is managed by Dr. [name]  Actively treated with chemotherapy/immunotherapy: yes  Current Therapy: carboplatin, docetaxel, trastuzumab, and pertuzumab  Last Treated: 10/19/2017 (day 1, cycle 3)  Assessment: Plan:    Nausea without vomiting - Plan: 0.9 %  sodium chloride infusion, sodium chloride 0.9 % injection 10 mL, heparin lock flush 100 unit/mL  Malignant neoplasm of upper-outer quadrant of left breast in female, estrogen receptor positive (HCC) - Plan: sodium chloride 0.9 % injection 10 mL, heparin lock flush 100 unit/mL  Arthralgia, unspecified joint - Plan: sodium chloride 0.9 % injection 10 mL, heparin lock flush 100 unit/mL, HYDROcodone-acetaminophen (NORCO) 5-325 MG tablet  Anorexia - Plan: sodium chloride 0.9 % injection 10 mL, heparin lock flush 100 unit/mL   HER-2/neu malignant neoplasm of the left breast: The patient is status post cycle 3, day 1 of carboplatin, docetaxel, trastuzumab, and pertuzumab which was dosed on 10/19/2017.  Nausea without vomiting: Patient was given 1 L of normal saline today.  Her medications were reviewed.  She was told that she could use his Ativan sublingual every 8 hours as needed for nausea.  Arthralgias: The patient was instructed to continue using Claritin after her PEG filgrastim injection.  She was also told that she could use Tylenol or Motrin as needed.  She was given a prescription for Vicodin and was told that she could take 1/2 to 1 tablet every 4 hours as needed for pain.  Anorexia: It was reviewed with Mrs. Selk that she should try multiple small meals throughout the day rather than fewer large meals.  Please see After Visit Summary for patient specific instructions.  Future Appointments  Date Time Provider Department Center  11/09/2017  8:45 AM [facility] LAB 2 [facility] None  11/09/2017  9:00 AM [facility]  FLUSH [facility] None  11/09/2017  9:30 AM Causey, Lindsey Cornetto, NP [facility] None  11/09/2017 10:15 AM [facility] INFUSION [facility] None  11/29/2017  9:00 AM [facility] ECHO 1-BUZZ [facility]  11/29/2017 10:00 AM Bensimhon, Daniel R, MD [facility] None  11/30/2017  9:45 AM [facility] LAB 2 [facility] None  11/30/2017 10:00 AM [facility] FLUSH [facility] None  11/30/2017 10:30 AM Causey, Lindsey Cornetto, NP [facility] None  11/30/2017 11:15 AM [facility] INFUSION [facility] None  08/29/2018  8:00 AM Amundson C Silva, Brook E, MD [facility] None    No orders of the defined types were placed in this encounter.      Subjective:   Patient ID:  Madison Hopkins is a 62 y.o. (DOB 09/29/1955) female.  Chief Complaint:  Chief Complaint  Patient presents with  . Nausea  . Fatigue  . Anorexia    HPI Madison Hopkins Is a 62-year-old female with a HER-2 positive, weakly estrogen receptor positive breast cancer who is status post day 1, cycle 3 of carboplatin, docetaxel, trastuzumab, and pertuzumab which was dosed on 10/19/2017. She presents to the office today with arthralgias, nausea, and anorexia. She is forcing herself to eat.  She request to receive IV fluids today.  She would like to receive IV fluids after her upcoming treatments.  She denies fevers, chills, sweats, vomiting, constipation, or diarrhea.  Medications: I have reviewed the patient's current medications.  Allergies:  Allergies  Allergen Reactions  . Penicillins Nausea Only    Has patient had a PCN reaction causing immediate rash, facial/tongue/throat swelling, SOB or lightheadedness with hypotension: No Has patient had   a PCN reaction causing severe rash involving mucus membranes or skin necrosis: No Has patient had a PCN reaction that required hospitalization: No Has patient had a PCN reaction occurring within the last 10 years: Yes--nausea & headache ONLY If all of the above answers are "NO", then may proceed with  Cephalosporin use.     Past Medical History:  Diagnosis Date  . Abnormal Pap smear of vagina 07/28/2016   LGSIL; colpo 07/2016 atrophic squamous cells; colpo 07/2017 - atypia  . Allergy   . Breast cancer (HCC)    Metastatic Left breast  . Elevated hemoglobin A1c 07/28/2016   level - 6.1  . Endometriosis   . Low vitamin D level 07/28/2016   level 24.7  . STD (sexually transmitted disease)     Past Surgical History:  Procedure Laterality Date  . ABDOMINAL HYSTERECTOMY    . CESAREAN SECTION    . COLON SURGERY    . OOPHORECTOMY    . PORTACATH PLACEMENT N/A 09/06/2017   Procedure: INSERTION PORT-A-CATH;  Surgeon: Newman, David, MD;  Location: [facility] OR;  Service: General;  Laterality: N/A;  . SMALL INTESTINE SURGERY    . SPINE SURGERY      Family History  Problem Relation Age of Onset  . Mental illness Mother   . Alcoholism Mother   . Cirrhosis Mother   . Cancer Father   . Lung cancer Father     Social History   Socioeconomic History  . Marital status: Single    Spouse name: Not on file  . Number of children: 2  . Years of education: Not on file  . Highest education level: Not on file  Occupational History  . Not on file  Social Needs  . Financial resource strain: Not on file  . Food insecurity:    Worry: Not on file    Inability: Not on file  . Transportation needs:    Medical: Not on file    Non-medical: Not on file  Tobacco Use  . Smoking status: Never Smoker  . Smokeless tobacco: Never Used  Substance and Sexual Activity  . Alcohol use: Yes    Alcohol/week: 0.0 standard drinks    Comment: occasional  . Drug use: Yes    Types: Marijuana  . Sexual activity: Not Currently    Birth control/protection: Post-menopausal, Surgical    Comment: Hysterectomy  Lifestyle  . Physical activity:    Days per week: Not on file    Minutes per session: Not on file  . Stress: Not on file  Relationships  . Social connections:    Talks on phone: Not on file    Gets  together: Not on file    Attends religious service: Not on file    Active member of club or organization: Not on file    Attends meetings of clubs or organizations: Not on file    Relationship status: Not on file  . Intimate partner violence:    Fear of current or ex partner: Not on file    Emotionally abused: Not on file    Physically abused: Not on file    Forced sexual activity: Not on file  Other Topics Concern  . Not on file  Social History Narrative  . Not on file    Past Medical History, Surgical history, Social history, and Family history were reviewed and updated as appropriate.   Please see review of systems for further details on the patient's review from today.   Review of   Systems:  Review of Systems  Constitutional: Positive for appetite change. Negative for chills, diaphoresis and fever.  Respiratory: Negative for cough, choking, shortness of breath and wheezing.   Cardiovascular: Negative for chest pain and palpitations.  Gastrointestinal: Positive for nausea. Negative for constipation, diarrhea and vomiting.  Genitourinary: Negative for decreased urine volume.  Musculoskeletal: Positive for arthralgias.  Neurological: Negative for headaches.    Objective:   Physical Exam:  BP 119/78 (BP Location: Left Arm, Patient Position: Sitting)   Pulse 88   Temp 98.2 F (36.8 C) (Oral)   Resp 18   Ht 5' 8.19" (1.732 m)   Wt 186 lb 12.8 oz (84.7 kg)   LMP  (LMP Unknown)   SpO2 100%   BMI 28.24 kg/m  ECOG: 0  Physical Exam  Constitutional: No distress.  HENT:  Head: Normocephalic and atraumatic.  Cardiovascular: Normal rate, regular rhythm and normal heart sounds. Exam reveals no gallop and no friction rub.  No murmur heard. Pulmonary/Chest: Effort normal and breath sounds normal. No respiratory distress. She has no wheezes. She has no rales.  Abdominal: Soft. She exhibits no distension and no mass. There is no tenderness. There is no rebound and no guarding.    Neurological: She is alert. She displays normal reflexes.  Skin: Skin is warm and dry. No rash noted. She is not diaphoretic. No erythema.  Psychiatric: She has a normal mood and affect. Her behavior is normal. Judgment and thought content normal.    Lab Review:     Component Value Date/Time   NA 135 10/25/2017 1055   K 3.6 10/25/2017 1055   CL 101 10/25/2017 1055   CO2 26 10/25/2017 1055   GLUCOSE 94 10/25/2017 1055   BUN 10 10/25/2017 1055   CREATININE 0.81 10/25/2017 1055   CALCIUM 9.4 10/25/2017 1055   PROT 7.3 10/25/2017 1055   ALBUMIN 3.9 10/25/2017 1055   AST 23 10/25/2017 1055   ALT 30 10/25/2017 1055   ALKPHOS 182 (H) 10/25/2017 1055   BILITOT 0.6 10/25/2017 1055   GFRNONAA >60 10/25/2017 1055   GFRAA >60 10/25/2017 1055       Component Value Date/Time   WBC 8.9 10/25/2017 1055   WBC 4.9 10/19/2017 0927   RBC 4.07 10/25/2017 1055   HGB 11.0 (L) 10/25/2017 1055   HCT 33.6 (L) 10/25/2017 1055   PLT 261 10/25/2017 1055   MCV 82.6 10/25/2017 1055   MCV 80.2 05/02/2014 1342   MCH 27.0 10/25/2017 1055   MCHC 32.7 10/25/2017 1055   RDW 19.3 (H) 10/25/2017 1055   LYMPHSABS 1.4 10/25/2017 1055   MONOABS 0.7 10/25/2017 1055   EOSABS 0.1 10/25/2017 1055   BASOSABS 0.0 10/25/2017 1055   -------------------------------  Imaging from last 24 hours (if applicable):  Radiology interpretation: No results found.     

## 2017-10-25 NOTE — Patient Instructions (Signed)
Dehydration, Adult Dehydration is when there is not enough fluid or water in your body. This happens when you lose more fluids than you take in. Dehydration can range from mild to very bad. It should be treated right away to keep it from getting very bad. Symptoms of mild dehydration may include:  Thirst.  Dry lips.  Slightly dry mouth.  Dry, warm skin.  Dizziness. Symptoms of moderate dehydration may include:  Very dry mouth.  Muscle cramps.  Dark pee (urine). Pee may be the color of tea.  Your body making less pee.  Your eyes making fewer tears.  Heartbeat that is uneven or faster than normal (palpitations).  Headache.  Light-headedness, especially when you stand up from sitting.  Fainting (syncope). Symptoms of very bad dehydration may include:  Changes in skin, such as: ? Cold and clammy skin. ? Blotchy (mottled) or pale skin. ? Skin that does not quickly return to normal after being lightly pinched and let go (poor skin turgor).  Changes in body fluids, such as: ? Feeling very thirsty. ? Your eyes making fewer tears. ? Not sweating when body temperature is high, such as in hot weather. ? Your body making very little pee.  Changes in vital signs, such as: ? Weak pulse. ? Pulse that is more than 100 beats a minute when you are sitting still. ? Fast breathing. ? Low blood pressure.  Other changes, such as: ? Sunken eyes. ? Cold hands and feet. ? Confusion. ? Lack of energy (lethargy). ? Trouble waking up from sleep. ? Short-term weight loss. ? Unconsciousness. Follow these instructions at home:  If told by your doctor, drink an ORS: ? Make an ORS by using instructions on the package. ? Start by drinking small amounts, about  cup (120 mL) every 5-10 minutes. ? Slowly drink more until you have had the amount that your doctor said to have.  Drink enough clear fluid to keep your pee clear or pale yellow. If you were told to drink an ORS, finish the ORS  first, then start slowly drinking clear fluids. Drink fluids such as: ? Water. Do not drink only water by itself. Doing that can make the salt (sodium) level in your body get too low (hyponatremia). ? Ice chips. ? Fruit juice that you have added water to (diluted). ? Low-calorie sports drinks.  Avoid: ? Alcohol. ? Drinks that have a lot of sugar. These include high-calorie sports drinks, fruit juice that does not have water added, and soda. ? Caffeine. ? Foods that are greasy or have a lot of fat or sugar.  Take over-the-counter and prescription medicines only as told by your doctor.  Do not take salt tablets. Doing that can make the salt level in your body get too high (hypernatremia).  Eat foods that have minerals (electrolytes). Examples include bananas, oranges, potatoes, tomatoes, and spinach.  Keep all follow-up visits as told by your doctor. This is important. Contact a doctor if:  You have belly (abdominal) pain that: ? Gets worse. ? Stays in one area (localizes).  You have a rash.  You have a stiff neck.  You get angry or annoyed more easily than normal (irritability).  You are more sleepy than normal.  You have a harder time waking up than normal.  You feel: ? Weak. ? Dizzy. ? Very thirsty.  You have peed (urinated) only a small amount of very dark pee during 6-8 hours. Get help right away if:  You have symptoms of   very bad dehydration.  You cannot drink fluids without throwing up (vomiting).  Your symptoms get worse with treatment.  You have a fever.  You have a very bad headache.  You are throwing up or having watery poop (diarrhea) and it: ? Gets worse. ? Does not go away.  You have blood or something green (bile) in your throw-up.  You have blood in your poop (stool). This may cause poop to look black and tarry.  You have not peed in 6-8 hours.  You pass out (faint).  Your heart rate when you are sitting still is more than 100 beats a  minute.  You have trouble breathing. This information is not intended to replace advice given to you by your health care provider. Make sure you discuss any questions you have with your health care provider. Document Released: 12/11/2008 Document Revised: 09/04/2015 Document Reviewed: 04/10/2015 [employer] Interactive Patient Education  2018 [employer]  

## 2017-10-27 ENCOUNTER — Inpatient Hospital Stay: Payer: 59 | Admitting: Adult Health

## 2017-10-27 ENCOUNTER — Inpatient Hospital Stay: Payer: 59

## 2017-11-01 ENCOUNTER — Telehealth: Payer: Self-pay | Admitting: General Practice

## 2017-11-01 ENCOUNTER — Other Ambulatory Visit: Payer: Self-pay | Admitting: Oncology

## 2017-11-01 ENCOUNTER — Telehealth: Payer: Self-pay

## 2017-11-01 NOTE — Telephone Encounter (Signed)
[place] CSW Progress Notes  Call from [employer], patient concerned about not being able to access nutrition services, massage, what to do after chemo.  Feels like she has asked for help from [facility], but does not feel connected.  Offered to add to mailing list, mailed information packet on [place].  Says "I go without eating after my chemo, I am just so nauseated, cannot find the right choices."  Has been seen by dietitian and chaplain, was first seen at [facility].  Wants to be able to contact nurse navigator directly, navigator asked to contact patient to explain how to best contact providers for these needs.  Patient at work, could not have extended conversation, encouraged to call back as needed for support/resources.  Madison Shell, LCSW Clinical Social Worker Phone:  [phone]

## 2017-11-01 NOTE — Telephone Encounter (Signed)
Nutrition  Received message from Education officer, museum that patient had questions regarding nutrition and needing additional support.  Called patient and left message on voice mail asking patient to return RD call.    [name] B. Zenia Resides, [place], Madison Hopkins Registered Dietitian [phone] (pager)

## 2017-11-01 NOTE — Progress Notes (Unsigned)
[place] brought [place] signed and notarized advanced directives, naming rash each Rogelia Mire as her healthcare power of attorney.  This person can be reached at [phone] and if not available then Jule Ser who can be reached at [phone].  The documents have been given to medical records to scan as of 11/01/2017

## 2017-11-03 ENCOUNTER — Telehealth: Payer: Self-pay | Admitting: *Deleted

## 2017-11-03 NOTE — Telephone Encounter (Signed)
Received notification from SW that pt "doesn't have my nurses direct line". Left vm for pt to return call to ensure she has my number as well as the main number to reach Dr. [name] Dad nurses. Contact information provided.

## 2017-11-07 ENCOUNTER — Telehealth: Payer: Self-pay

## 2017-11-07 NOTE — Telephone Encounter (Signed)
Nutrition  Patient returned RD's call.    Patient reports 2 days following chemotherapy has nausea and food does not taste right and this last for about a week.  Has been eating well lately.  Next planned chemotherapy on Thursday of this week.    Talked briefly but patient had to go due to being at work. Patient agreeable to having handouts mailed. Will mail handouts on "Nausea and Vomiting" and "Taste Changes" Contact information card mailed as well.    Patient to contact RD for support as needed.    Madison Hopkins, [place], [place] Registered Dietitian [phone] (pager)

## 2017-11-07 NOTE — Telephone Encounter (Signed)
Nutrition  No return call from patient.  Called patient again and left voice mail with return phone number offering nutrition services support.    Aayan Haskew B. Zenia Resides, [place], [place] Registered Dietitian [phone] (pager)

## 2017-11-09 ENCOUNTER — Inpatient Hospital Stay: Payer: 59

## 2017-11-09 ENCOUNTER — Encounter: Payer: Self-pay | Admitting: Adult Health

## 2017-11-09 ENCOUNTER — Inpatient Hospital Stay (HOSPITAL_BASED_OUTPATIENT_CLINIC_OR_DEPARTMENT_OTHER): Payer: 59 | Admitting: Adult Health

## 2017-11-09 ENCOUNTER — Encounter: Payer: Self-pay | Admitting: *Deleted

## 2017-11-09 ENCOUNTER — Telehealth: Payer: Self-pay | Admitting: Oncology

## 2017-11-09 ENCOUNTER — Inpatient Hospital Stay: Payer: 59 | Attending: Adult Health

## 2017-11-09 VITALS — BP 143/85 | HR 76 | Temp 98.1°F | Resp 18 | Ht 68.19 in | Wt 192.7 lb

## 2017-11-09 DIAGNOSIS — Z5111 Encounter for antineoplastic chemotherapy: Secondary | ICD-10-CM | POA: Insufficient documentation

## 2017-11-09 DIAGNOSIS — C50412 Malignant neoplasm of upper-outer quadrant of left female breast: Secondary | ICD-10-CM | POA: Diagnosis present

## 2017-11-09 DIAGNOSIS — Z5112 Encounter for antineoplastic immunotherapy: Secondary | ICD-10-CM | POA: Insufficient documentation

## 2017-11-09 DIAGNOSIS — C773 Secondary and unspecified malignant neoplasm of axilla and upper limb lymph nodes: Secondary | ICD-10-CM

## 2017-11-09 DIAGNOSIS — Z95828 Presence of other vascular implants and grafts: Secondary | ICD-10-CM | POA: Insufficient documentation

## 2017-11-09 DIAGNOSIS — Z170 Estrogen receptor positive status [ER+]: Secondary | ICD-10-CM

## 2017-11-09 DIAGNOSIS — R197 Diarrhea, unspecified: Secondary | ICD-10-CM | POA: Diagnosis not present

## 2017-11-09 LAB — COMPREHENSIVE METABOLIC PANEL
ALT: 61 U/L — ABNORMAL HIGH (ref 0–44)
AST: 33 U/L (ref 15–41)
Albumin: 4 g/dL (ref 3.5–5.0)
Alkaline Phosphatase: 119 U/L (ref 38–126)
Anion gap: 11 (ref 5–15)
BUN: 15 mg/dL (ref 8–23)
CO2: 22 mmol/L (ref 22–32)
Calcium: 10.3 mg/dL (ref 8.9–10.3)
Chloride: 106 mmol/L (ref 98–111)
Creatinine, Ser: 0.86 mg/dL (ref 0.44–1.00)
GFR calc Af Amer: >60 mL/min (ref >60)
GFR calc non Af Amer: >60 mL/min (ref >60)
Glucose, Bld: 150 mg/dL — ABNORMAL HIGH (ref 70–99)
Potassium: 4 mmol/L (ref 3.5–5.1)
Sodium: 139 mmol/L (ref 135–145)
Total Bilirubin: 0.6 mg/dL (ref 0.3–1.2)
Total Protein: 7.9 g/dL (ref 6.5–8.1)

## 2017-11-09 LAB — CBC WITH DIFFERENTIAL/PLATELET
Basophils Absolute: 0 10*3/uL (ref 0.0–0.1)
Basophils Relative: 0 %
Eosinophils Absolute: 0 10*3/uL (ref 0.0–0.5)
Eosinophils Relative: 0 %
HCT: 31.8 % — ABNORMAL LOW (ref 34.8–46.6)
Hemoglobin: 10.3 g/dL — ABNORMAL LOW (ref 11.6–15.9)
Lymphocytes Relative: 23 %
Lymphs Abs: 1.4 10*3/uL (ref 0.9–3.3)
MCH: 27.8 pg (ref 25.1–34.0)
MCHC: 32.4 g/dL (ref 31.5–36.0)
MCV: 85.7 fL (ref 79.5–101.0)
Monocytes Absolute: 0.6 10*3/uL (ref 0.1–0.9)
Monocytes Relative: 10 %
Neutro Abs: 3.9 10*3/uL (ref 1.5–6.5)
Neutrophils Relative %: 67 %
Platelets: 251 10*3/uL (ref 145–400)
RBC: 3.71 MIL/uL (ref 3.70–5.45)
RDW: 21.7 % — ABNORMAL HIGH (ref 11.2–14.5)
WBC: 5.9 10*3/uL (ref 3.9–10.3)

## 2017-11-09 MED ORDER — SODIUM CHLORIDE 0.9 % IV SOLN
Freq: Once | INTRAVENOUS | Status: AC
  Administered 2017-11-09: 11:00:00 via INTRAVENOUS
  Filled 2017-11-09: qty 5

## 2017-11-09 MED ORDER — ACETAMINOPHEN 325 MG PO TABS
650.0000 mg | ORAL_TABLET | Freq: Once | ORAL | Status: AC
  Administered 2017-11-09: 650 mg via ORAL

## 2017-11-09 MED ORDER — SODIUM CHLORIDE 0.9 % IV SOLN
75.0000 mg/m2 | Freq: Once | INTRAVENOUS | Status: AC
  Administered 2017-11-09: 160 mg via INTRAVENOUS
  Filled 2017-11-09: qty 16

## 2017-11-09 MED ORDER — SODIUM CHLORIDE 0.9% FLUSH
10.0000 mL | INTRAVENOUS | Status: DC | PRN
  Administered 2017-11-09: 10 mL
  Filled 2017-11-09: qty 10

## 2017-11-09 MED ORDER — LORAZEPAM 0.5 MG PO TABS: 0.5000 mg | ORAL_TABLET | Freq: Every evening | ORAL | 1 refills | Status: DC | PRN

## 2017-11-09 MED ORDER — SODIUM CHLORIDE 0.9 % IV SOLN
609.0000 mg | Freq: Once | INTRAVENOUS | Status: AC
  Administered 2017-11-09: 610 mg via INTRAVENOUS
  Filled 2017-11-09: qty 61

## 2017-11-09 MED ORDER — SODIUM CHLORIDE 0.9 % IV SOLN
420.0000 mg | Freq: Once | INTRAVENOUS | Status: AC
  Administered 2017-11-09: 420 mg via INTRAVENOUS
  Filled 2017-11-09: qty 14

## 2017-11-09 MED ORDER — SODIUM CHLORIDE 0.9 % IV SOLN
Freq: Once | INTRAVENOUS | Status: AC
  Administered 2017-11-09: 11:00:00 via INTRAVENOUS
  Filled 2017-11-09: qty 250

## 2017-11-09 MED ORDER — PEGFILGRASTIM 6 MG/0.6ML ~~LOC~~ PSKT
PREFILLED_SYRINGE | SUBCUTANEOUS | Status: AC
  Filled 2017-11-09: qty 0.6

## 2017-11-09 MED ORDER — LIDOCAINE-PRILOCAINE 2.5-2.5 % EX CREA: TOPICAL_CREAM | CUTANEOUS | 3 refills | Status: DC

## 2017-11-09 MED ORDER — ACETAMINOPHEN 325 MG PO TABS
ORAL_TABLET | ORAL | Status: AC
  Filled 2017-11-09: qty 2

## 2017-11-09 MED ORDER — DIPHENHYDRAMINE HCL 25 MG PO CAPS
25.0000 mg | ORAL_CAPSULE | Freq: Once | ORAL | Status: AC
  Administered 2017-11-09: 25 mg via ORAL

## 2017-11-09 MED ORDER — SODIUM CHLORIDE 0.9 % IV SOLN
INTRAVENOUS | Status: DC
  Filled 2017-11-09: qty 250

## 2017-11-09 MED ORDER — PALONOSETRON HCL INJECTION 0.25 MG/5ML
0.2500 mg | Freq: Once | INTRAVENOUS | Status: AC
  Administered 2017-11-09: 0.25 mg via INTRAVENOUS

## 2017-11-09 MED ORDER — DIPHENHYDRAMINE HCL 25 MG PO CAPS
ORAL_CAPSULE | ORAL | Status: AC
  Filled 2017-11-09: qty 1

## 2017-11-09 MED ORDER — SODIUM CHLORIDE 0.9% FLUSH
10.0000 mL | Freq: Once | INTRAVENOUS | Status: AC
  Administered 2017-11-09: 10 mL
  Filled 2017-11-09: qty 10

## 2017-11-09 MED ORDER — PALONOSETRON HCL INJECTION 0.25 MG/5ML
INTRAVENOUS | Status: AC
  Filled 2017-11-09: qty 5

## 2017-11-09 MED ORDER — PEGFILGRASTIM 6 MG/0.6ML ~~LOC~~ PSKT
6.0000 mg | PREFILLED_SYRINGE | Freq: Once | SUBCUTANEOUS | Status: AC
  Administered 2017-11-09: 6 mg via SUBCUTANEOUS

## 2017-11-09 MED ORDER — PROCHLORPERAZINE MALEATE 10 MG PO TABS: 10.0000 mg | ORAL_TABLET | Freq: Four times a day (QID) | ORAL | 1 refills | Status: DC | PRN

## 2017-11-09 MED ORDER — HEPARIN SOD (PORK) LOCK FLUSH 100 UNIT/ML IV SOLN
500.0000 [IU] | Freq: Once | INTRAVENOUS | Status: AC | PRN
  Administered 2017-11-09: 500 [IU]
  Filled 2017-11-09: qty 5

## 2017-11-09 MED ORDER — TRASTUZUMAB CHEMO 150 MG IV SOLR
6.0000 mg/kg | Freq: Once | INTRAVENOUS | Status: AC
  Administered 2017-11-09: 546 mg via INTRAVENOUS
  Filled 2017-11-09: qty 26

## 2017-11-09 NOTE — Patient Instructions (Signed)
[facility] Discharge Instructions for Patients Receiving Chemotherapy  Today you received the following chemotherapy agents:  Herceptin, Perjeta, Taxotere, and Carboplatin.  To help prevent nausea and vomiting after your treatment, we encourage you to take your nausea medication as directed.   If you develop nausea and vomiting that is not controlled by your nausea medication, call the clinic.   BELOW ARE SYMPTOMS THAT SHOULD BE REPORTED IMMEDIATELY:  *FEVER GREATER THAN 100.5 F  *CHILLS WITH OR WITHOUT FEVER  NAUSEA AND VOMITING THAT IS NOT CONTROLLED WITH YOUR NAUSEA MEDICATION  *UNUSUAL SHORTNESS OF BREATH  *UNUSUAL BRUISING OR BLEEDING  TENDERNESS IN MOUTH AND THROAT WITH OR WITHOUT PRESENCE OF ULCERS  *URINARY PROBLEMS  *BOWEL PROBLEMS  UNUSUAL RASH Items with * indicate a potential emergency and should be followed up as soon as possible.  Feel free to call the clinic should you have any questions or concerns. The clinic phone number is [phone].  Please show the CHEMO ALERT CARD at check-in to the Emergency Department and triage nurse.   

## 2017-11-09 NOTE — Progress Notes (Signed)
Madison Hopkins  [phone] [phone]     ID: Madison Hopkins DOB: 12-23-1955  MR#: [id number]  YIR#:[id number]  Patient Care Team: Madison Hopkins as PCP - General (Physician Assistant) Madison Overall, MD as Consulting Physician (General Surgery) Madison Hopkins, Madison Dad, MD as Consulting Physician (Oncology) Madison Rudd, MD as Consulting Physician (Radiation Oncology) Madison Hopkins, Madison All, MD as Consulting Physician (Obstetrics and Gynecology) Madison Fitz, MD as Consulting Physician (Family Medicine) OTHER MD:  CHIEF COMPLAINT: HER-2 positive breast cancer  CURRENT TREATMENT: Neoadjuvant chemotherapy  INTERVAL HISTORY: Madison Hopkins returns today for follow-up of her HER-2 positive, weakly estrogen receptor positive breast cancer accompanied by her brother, son and daughter. She is receiving neoadjuvant chemotherapy with carboplatin, docetaxel, trastuzumab, and pertuzumab. Today is day 1 cycle 4.   REVIEW OF SYSTEMS: Madison Hopkins is doing well today.  After cycle three she noted that she felt rough.  On day 5 after treatment she felt some numbness and tingling in her fingertips.  This numbness was noted intermittently and for brief durations x 2 days.  She says it resolved after receiving IV fluids.  She denies any issues today such as fevers, chills, nausea, vomiting, unusual headaches, vision changes, bowel/bladder changes, cough, shortness of breath, palpitations, or any other concerns.    HISTORY OF CURRENT ILLNESS: From the original intake note:  Madison Hopkins noted a mass in the left axilla sometime in January or February 2019.  She eventually brought her to her gynecologist's attention, and underwent bilateral diagnostic mammography with tomography and left breast ultrasonography at [facility] on 08/04/2017 showing: breast density category B. There is a highly suspicious hypoechoic mass in the left breast at the 2 o'clock upper outer  quadrant measuring 2.3 x 1.6 x 2.2 cm, located 2 cm from the nipple. Sonographically, there were 2 enlarged lymph nodes in the left axilla, the largest with cortical thickening measuring 2.5 cm. No evidence of malignancy was seen in the right breast.   Accordingly on 08/07/2017 she proceeded to biopsy of the left breast area and 1 of the lymph nodes in question. The pathology from this procedure showed ([name]): Invasive ductal carcinoma, grade 3. Metastatic carcinoma was found in one left axillary lymph node. Prognostic indicators significant for: estrogen receptor, 30% positive with weak staining intensity and progesterone receptor, 0% negative. Proliferation marker Ki67 at 80%. HER2 amplified with ratios HER2/CEP17 signals 2.32 and average HER2 copies per cell 6.60  The patient's subsequent history is as detailed below.   PAST MEDICAL HISTORY: Past Medical History:  Diagnosis Date  . Abnormal Pap smear of vagina 07/28/2016   LGSIL; colpo 07/2016 atrophic squamous cells; colpo 07/2017 - atypia  . Allergy   . Breast cancer ([place])    Metastatic Left breast  . Elevated hemoglobin A1c 07/28/2016   level - 6.1  . Endometriosis   . Low vitamin D level 07/28/2016   level 24.7  . STD (sexually transmitted disease)   GERD but no ulcers  PAST SURGICAL HISTORY: Past Surgical History:  Procedure Laterality Date  . ABDOMINAL HYSTERECTOMY    . CESAREAN SECTION    . COLON SURGERY    . OOPHORECTOMY    . PORTACATH PLACEMENT N/A 09/06/2017   Procedure: INSERTION PORT-A-CATH;  Surgeon: Madison Overall, MD;  Location: [facility];  Service: General;  Laterality: N/A;  . SMALL INTESTINE SURGERY    . SPINE SURGERY    Hysterectomy with salpingo-oophorectomy, Tonsillectomy, Plantar Fascitis Right Foot Surgery  FAMILY HISTORY Family History  Problem Relation Age of Onset  . Mental illness Mother   . Alcoholism Mother   . Cirrhosis Mother   . Cancer Father   . Lung cancer Father   The patient' father  died at age 28 due to lung cancer (heavy smoker). The patient's mother died due to liver cirrhosis (heavy drinker). The patient has 2 brothers and no sisters. There was a paternal 1st cousin with colon cancer diagnosed in the mid 40's, who also had cervical cancer. The mother of this 1st cousin (the patient's paternal aunt) had cancer (the patient's is unsure of what type). There was also a paternal uncle with prostate cancer diagnosed in the 24's. The patient denies a family history of breast or ovarian cancer.     GYNECOLOGIC HISTORY:  No LMP recorded (lmp unknown). Patient has had a hysterectomy. Menarche: 62 years old Age at first live birth: 62 years old She is GXP2.  The patient is status post total hysterectomy with bilateral salpingo-oophorectomy in 1995.  She never used contraception. She notes that she had an estrogen shot one time but no other HRTs.    SOCIAL HISTORY:  The patient works in Therapist, art for the tax department. She is single. At home is herself and no pets. Her son, Madison Hopkins is age 72 and lives in [place], [place] as a Musician. The patient's daughter Madison Hopkins is age 21 and lives in [place] in Therapist, art for [employer]. The patient has 5 grandchildren and 1 great grandchild. She attends [employer].     ADVANCED DIRECTIVES: Not in place; at the 08/16/2017 visit the patient was given the appropriate forms to complete on notarized at her discretion   HEALTH MAINTENANCE: Social History   Tobacco Use  . Smoking status: Never Smoker  . Smokeless tobacco: Never Used  Substance Use Topics  . Alcohol use: Yes    Alcohol/week: 0.0 standard drinks    Comment: occasional  . Drug use: Yes    Types: Marijuana     Colonoscopy: 2009?  PAP: 07/31/2017/ positive for Atypical Squamous cells of uncertain significance.   Bone density: 10/24/2016 showed a T score of -0.7 (normal was (   Allergies  Allergen Reactions  . Penicillins Nausea Only    Has patient  had a PCN reaction causing immediate rash, facial/tongue/throat swelling, SOB or lightheadedness with hypotension: No Has patient had a PCN reaction causing severe rash involving mucus membranes or skin necrosis: No Has patient had a PCN reaction that required hospitalization: No Has patient had a PCN reaction occurring within the last 10 years: Yes--nausea & headache ONLY If Hopkins of the above answers are "NO", then may proceed with Cephalosporin use.     Current Outpatient Medications  Medication Sig Dispense Refill  . acyclovir ointment (ZOVIRAX) 5 % Apply 1 application topically every 4 (four) hours. 15 g 2  . Artificial Tear Solution (OPTI-TEARS OP) Place 1 drop into both eyes 3 (three) times daily as needed (for dry/irritated eyes.).    Marland Kitchen cholestyramine (QUESTRAN) 4 GM/DOSE powder Take 1 packet (4 g total) by mouth 2 (two) times daily with a meal. 378 g 2  . dexamethasone (DECADRON) 4 MG tablet Take 2 tablets (8 mg total) by mouth 2 (two) times daily. Start the day before Taxotere. Take once the day after, then 2 times a day x 2d. 30 tablet 1  . fluconazole (DIFLUCAN) 100 MG tablet TAKE DAILY FOR 5 DAYS STARTING ON  CHEMO DAY 30 tablet 0  . fluticasone (FLONASE) 50 MCG/ACT nasal spray Place 1 spray into both nostrils daily as needed for allergies.    Marland Kitchen HYDROcodone-acetaminophen (NORCO) 5-325 MG tablet 1/2 to 1 tablets Q 4 hours prn pain 30 tablet 0  . hydrocortisone (ANUSOL-HC) 25 MG suppository Place 1 suppository (25 mg total) rectally 2 (two) times daily. 12 suppository 0  . lidocaine-prilocaine (EMLA) cream Apply to affected area once 30 g 3  . loperamide (IMODIUM) 2 MG capsule Take 1 capsule (2 mg total) by mouth as needed for diarrhea or loose stools. 30 capsule 0  . LORazepam (ATIVAN) 0.5 MG tablet TAKE 1 TABLET (0.5 MG TOTAL) BY MOUTH AT BEDTIME AS NEEDED (NAUSEA OR VOMITING). 30 tablet 1  . Misc Natural Products (OSTEO BI-FLEX ADV TRIPLE ST PO) Take 1 tablet by mouth daily after  lunch.    . Multiple Vitamin (MULTIVITAMIN WITH MINERALS) TABS tablet Take 1 tablet by mouth daily after lunch.    . naproxen sodium (ALEVE) 220 MG tablet Take 220 mg by mouth 2 (two) times daily as needed (FOR PAIN.).     Marland Kitchen nitrofurantoin, macrocrystal-monohydrate, (MACROBID) 100 MG capsule Take 1 capsule (100 mg total) by mouth 2 (two) times daily. 20 capsule 0  . Nutritional Supplements (IMMUNE ENHANCE) TABS Take 1 tablet by mouth daily after lunch.    . prochlorperazine (COMPAZINE) 10 MG tablet Take 1 tablet (10 mg total) by mouth every 6 (six) hours as needed (Nausea or vomiting). 30 tablet 1  . tetrahydrozoline (VISINE) 0.05 % ophthalmic solution Place 1 drop into both eyes 3 (three) times daily as needed (for dry eyes.).    Marland Kitchen valACYclovir (VALTREX) 500 MG tablet Take 1 tablet (500 mg total) by mouth 2 (two) times daily. 60 tablet 2   No current facility-administered medications for this visit.     OBJECTIVE:  Vitals:   11/09/17 0929  BP: (!) 143/85  Pulse: 76  Resp: 18  Temp: 98.1 F (36.7 C)  SpO2: 100%     Body mass index is 29.14 kg/m.   Wt Readings from Last 3 Encounters:  11/09/17 192 lb 11.2 oz (87.4 kg)  10/25/17 186 lb 12.8 oz (84.7 kg)  10/19/17 193 lb 6 oz (87.7 kg)  ECOG FS:1 - Symptomatic but completely ambulatory GENERAL: Patient is a well appearing female in no acute distress HEENT:  Sclerae anicteric.  Oropharynx clear and moist. No ulcerations or evidence of oropharyngeal candidiasis. Neck is supple.  NODES:  No cervical, supraclavicular, or axillary lymphadenopathy palpated.  BREAST EXAM:  Unable to palpate a definite mass in left breast LUNGS:  Clear to auscultation bilaterally.  No wheezes or rhonchi. HEART:  Regular rate and rhythm. No murmur appreciated. ABDOMEN:  Soft, nontender.  Positive, normoactive bowel sounds. No organomegaly palpated. MSK:  No focal spinal tenderness to palpation. Full range of motion bilaterally in the upper  extremities. EXTREMITIES:  No peripheral edema.   SKIN:  Clear with no obvious rashes or skin changes. No nail dyscrasia. NEURO:  Nonfocal. Well oriented.  Appropriate affect.    LAB RESULTS:  CMP     Component Value Date/Time   NA 139 11/09/2017 0850   K 4.0 11/09/2017 0850   CL 106 11/09/2017 0850   CO2 22 11/09/2017 0850   GLUCOSE 150 (H) 11/09/2017 0850   BUN 15 11/09/2017 0850   CREATININE 0.86 11/09/2017 0850   CREATININE 0.81 10/25/2017 1055   CALCIUM 10.3 11/09/2017 0850   PROT  7.9 11/09/2017 0850   ALBUMIN 4.0 11/09/2017 0850   AST 33 11/09/2017 0850   AST 23 10/25/2017 1055   ALT 61 (H) 11/09/2017 0850   ALT 30 10/25/2017 1055   ALKPHOS 119 11/09/2017 0850   BILITOT 0.6 11/09/2017 0850   BILITOT 0.6 10/25/2017 1055   GFRNONAA >60 11/09/2017 0850   GFRNONAA >60 10/25/2017 1055   GFRAA >60 11/09/2017 0850   GFRAA >60 10/25/2017 1055    No results found for: TOTALPROTELP, ALBUMINELP, A1GS, A2GS, BETS, BETA2SER, GAMS, MSPIKE, SPEI  No results found for: KPAFRELGTCHN, LAMBDASER, KAPLAMBRATIO  Lab Results  Component Value Date   WBC 5.9 11/09/2017   NEUTROABS 3.9 11/09/2017   HGB 10.3 (L) 11/09/2017   HCT 31.8 (L) 11/09/2017   MCV 85.7 11/09/2017   PLT 251 11/09/2017    '@LASTCHEMISTRY'$ @  No results found for: LABCA2  No components found for: [id number]  No results for input(s): INR in the last 168 hours.  No results found for: LABCA2  No results found for: CVE938  No results found for: BOF751  No results found for: WCH852  No results found for: CA2729  No components found for: HGQUANT  No results found for: CEA1 / No results found for: CEA1   No results found for: AFPTUMOR  No results found for: CHROMOGRNA  No results found for: PSA1  Appointment on 11/09/2017  Component Date Value Ref Range Status  . Sodium 11/09/2017 139  135 - 145 mmol/L Final  . Potassium 11/09/2017 4.0  3.5 - 5.1 mmol/L Final  . Chloride 11/09/2017 106  98 - 111  mmol/L Final  . CO2 11/09/2017 22  22 - 32 mmol/L Final  . Glucose, Bld 11/09/2017 150* 70 - 99 mg/dL Final  . BUN 11/09/2017 15  8 - 23 mg/dL Final  . Creatinine, Ser 11/09/2017 0.86  0.44 - 1.00 mg/dL Final  . Calcium 11/09/2017 10.3  8.9 - 10.3 mg/dL Final  . Total Protein 11/09/2017 7.9  6.5 - 8.1 g/dL Final  . Albumin 11/09/2017 4.0  3.5 - 5.0 g/dL Final  . AST 11/09/2017 33  15 - 41 U/L Final  . ALT 11/09/2017 61* 0 - 44 U/L Final  . Alkaline Phosphatase 11/09/2017 119  38 - 126 U/L Final  . Total Bilirubin 11/09/2017 0.6  0.3 - 1.2 mg/dL Final  . GFR calc non Af Amer 11/09/2017 >60  >60 mL/min Final  . GFR calc Af Amer 11/09/2017 >60  >60 mL/min Final   Comment: (NOTE) The eGFR has been calculated using the CKD EPI equation. This calculation has not been validated in [facility] clinical situations. eGFR's persistently <60 mL/min signify possible Chronic Kidney Disease.   Georgiann Hahn gap 11/09/2017 11  5 - 15 Final   Performed at [facility], [place] [address], [place], [place] [address]  . WBC 11/09/2017 5.9  3.9 - 10.3 K/uL Final  . RBC 11/09/2017 3.71  3.70 - 5.45 MIL/uL Final  . Hemoglobin 11/09/2017 10.3* 11.6 - 15.9 g/dL Final  . HCT 11/09/2017 31.8* 34.8 - 46.6 % Final  . MCV 11/09/2017 85.7  79.5 - 101.0 fL Final  . MCH 11/09/2017 27.8  25.1 - 34.0 pg Final  . MCHC 11/09/2017 32.4  31.5 - 36.0 g/dL Final  . RDW 11/09/2017 21.7* 11.2 - 14.5 % Final  . Platelets 11/09/2017 251  145 - 400 K/uL Final  . Neutrophils Relative % 11/09/2017 67  % Final  . Neutro Abs 11/09/2017 3.9  1.5 - 6.5 K/uL Final  . Lymphocytes Relative 11/09/2017 23  % Final  . Lymphs Abs 11/09/2017 1.4  0.9 - 3.3 K/uL Final  . Monocytes Relative 11/09/2017 10  % Final  . Monocytes Absolute 11/09/2017 0.6  0.1 - 0.9 K/uL Final  . Eosinophils Relative 11/09/2017 0  % Final  . Eosinophils Absolute 11/09/2017 0.0  0.0 - 0.5 K/uL Final  . Basophils Relative 11/09/2017 0  % Final  .  Basophils Absolute 11/09/2017 0.0  0.0 - 0.1 K/uL Final   Performed at [facility], [place] [address] [place], [place] [address]    (this displays the last labs from the last 3 days)  No results found for: TOTALPROTELP, ALBUMINELP, A1GS, A2GS, BETS, BETA2SER, GAMS, MSPIKE, SPEI (this displays SPEP labs)  No results found for: KPAFRELGTCHN, LAMBDASER, KAPLAMBRATIO (kappa/lambda light chains)  No results found for: HGBA, HGBA2QUANT, HGBFQUANT, HGBSQUAN (Hemoglobinopathy evaluation)   No results found for: LDH  No results found for: IRON, TIBC, IRONPCTSAT (Iron and TIBC)  No results found for: FERRITIN  Urinalysis    Component Value Date/Time   BILIRUBINUR negative 10/07/2017 0939   BILIRUBINUR n 08/25/2016 1407   KETONESUR negative 10/07/2017 0939   PROTEINUR =30 (A) 10/07/2017 0939   PROTEINUR n 08/25/2016 1407   UROBILINOGEN 1.0 10/07/2017 0939   NITRITE Positive (A) 10/07/2017 0939   NITRITE n 08/25/2016 1407   LEUKOCYTESUR Negative 10/07/2017 0939     STUDIES: No results found.  ELIGIBLE FOR AVAILABLE RESEARCH PROTOCOL: yes (see Research Studies)  ASSESSMENT: 62 y.o. [place], [place] woman status post left breast upper outer quadrant and left axillary lymph node biopsy 08/07/2017, both positive for a T2 N1, stage IIB invasive ductal carcinoma, grade 3, estrogen receptor weakly positive, progesterone receptor negative, but HER-2 amplified, with an MIB-1 of 80%  (a) staging chest CT and bone scan 08/30/2017 showed no evidence of metastatic disease  (1) neoadjuvant chemotherapy will consist of carboplatin, docetaxel, trastuzumab, and Pertuzumab given every 21 days x 6 starting 09/07/2017, perjeta omitted with cycle 2 and 3 due to diarrhea  (2) trastuzumab will be continued to complete 6 months  (a) echocardiogram 08/28/2017 showed an ejection fraction in the 60-65% range.  (3) definitive surgery to follow  (4) adjuvant radiation to follow  surgery  (5) will start antiestrogens at the completion of local therapy  PLAN:  Latosha is doing well today.  I reviewed her labs with her in detail.  She will proceed with treatment today with Docetaxel, Carboplatin, Trastuzumab, Pertuzumab .  She will undergo IV fluids on Saturday and she will see me back on Tuesday after her treatment.  We reviewed the potential of peripheral neuropathy and I alerted her as to what to look for.  She will meet with Dr. Dalbert Batman on 9/30 for her surgical consultation.    She knows to call for any other issues that may develop before the next visit.   A total of (30) minutes of face-to-face time was spent with this patient with greater than 50% of that time in counseling and care-coordination.   Wilber Bihari, NP  11/09/17 10:13 AM Medical Oncology and Hematology [facility] [address] [place], [place] [address] Tel. [phone]    Fax. [phone]

## 2017-11-09 NOTE — Telephone Encounter (Signed)
Gave avs and calendar ° °

## 2017-11-10 ENCOUNTER — Other Ambulatory Visit: Payer: Self-pay | Admitting: Adult Health

## 2017-11-10 DIAGNOSIS — C50412 Malignant neoplasm of upper-outer quadrant of left female breast: Secondary | ICD-10-CM

## 2017-11-10 DIAGNOSIS — Z170 Estrogen receptor positive status [ER+]: Principal | ICD-10-CM

## 2017-11-11 ENCOUNTER — Inpatient Hospital Stay: Payer: 59

## 2017-11-11 VITALS — BP 145/84 | HR 68 | Temp 97.9°F | Resp 18

## 2017-11-11 DIAGNOSIS — Z5112 Encounter for antineoplastic immunotherapy: Secondary | ICD-10-CM | POA: Diagnosis not present

## 2017-11-11 DIAGNOSIS — Z95828 Presence of other vascular implants and grafts: Secondary | ICD-10-CM

## 2017-11-11 DIAGNOSIS — Z170 Estrogen receptor positive status [ER+]: Principal | ICD-10-CM

## 2017-11-11 DIAGNOSIS — C50412 Malignant neoplasm of upper-outer quadrant of left female breast: Secondary | ICD-10-CM

## 2017-11-11 MED ORDER — SODIUM CHLORIDE 0.9 % IV SOLN
INTRAVENOUS | Status: DC
  Administered 2017-11-11: 10:00:00 via INTRAVENOUS
  Filled 2017-11-11 (×2): qty 250

## 2017-11-11 MED ORDER — HEPARIN SOD (PORK) LOCK FLUSH 100 UNIT/ML IV SOLN
500.0000 [IU] | Freq: Once | INTRAVENOUS | Status: AC
  Administered 2017-11-11: 500 [IU]
  Filled 2017-11-11: qty 5

## 2017-11-11 MED ORDER — SODIUM CHLORIDE 0.9% FLUSH
10.0000 mL | Freq: Once | INTRAVENOUS | Status: DC
  Filled 2017-11-11: qty 10

## 2017-11-11 NOTE — Patient Instructions (Signed)
Dehydration, Adult Dehydration is when there is not enough fluid or water in your body. This happens when you lose more fluids than you take in. Dehydration can range from mild to very bad. It should be treated right away to keep it from getting very bad. Symptoms of mild dehydration may include:  Thirst.  Dry lips.  Slightly dry mouth.  Dry, warm skin.  Dizziness. Symptoms of moderate dehydration may include:  Very dry mouth.  Muscle cramps.  Dark pee (urine). Pee may be the color of tea.  Your body making less pee.  Your eyes making fewer tears.  Heartbeat that is uneven or faster than normal (palpitations).  Headache.  Light-headedness, especially when you stand up from sitting.  Fainting (syncope). Symptoms of very bad dehydration may include:  Changes in skin, such as: ? Cold and clammy skin. ? Blotchy (mottled) or pale skin. ? Skin that does not quickly return to normal after being lightly pinched and let go (poor skin turgor).  Changes in body fluids, such as: ? Feeling very thirsty. ? Your eyes making fewer tears. ? Not sweating when body temperature is high, such as in hot weather. ? Your body making very little pee.  Changes in vital signs, such as: ? Weak pulse. ? Pulse that is more than 100 beats a minute when you are sitting still. ? Fast breathing. ? Low blood pressure.  Other changes, such as: ? Sunken eyes. ? Cold hands and feet. ? Confusion. ? Lack of energy (lethargy). ? Trouble waking up from sleep. ? Short-term weight loss. ? Unconsciousness. Follow these instructions at home:  If told by your doctor, drink an ORS: ? Make an ORS by using instructions on the package. ? Start by drinking small amounts, about  cup (120 mL) every 5-10 minutes. ? Slowly drink more until you have had the amount that your doctor said to have.  Drink enough clear fluid to keep your pee clear or pale yellow. If you were told to drink an ORS, finish the ORS  first, then start slowly drinking clear fluids. Drink fluids such as: ? Water. Do not drink only water by itself. Doing that can make the salt (sodium) level in your body get too low (hyponatremia). ? Ice chips. ? Fruit juice that you have added water to (diluted). ? Low-calorie sports drinks.  Avoid: ? Alcohol. ? Drinks that have a lot of sugar. These include high-calorie sports drinks, fruit juice that does not have water added, and soda. ? Caffeine. ? Foods that are greasy or have a lot of fat or sugar.  Take over-the-counter and prescription medicines only as told by your doctor.  Do not take salt tablets. Doing that can make the salt level in your body get too high (hypernatremia).  Eat foods that have minerals (electrolytes). Examples include bananas, oranges, potatoes, tomatoes, and spinach.  Keep all follow-up visits as told by your doctor. This is important. Contact a doctor if:  You have belly (abdominal) pain that: ? Gets worse. ? Stays in one area (localizes).  You have a rash.  You have a stiff neck.  You get angry or annoyed more easily than normal (irritability).  You are more sleepy than normal.  You have a harder time waking up than normal.  You feel: ? Weak. ? Dizzy. ? Very thirsty.  You have peed (urinated) only a small amount of very dark pee during 6-8 hours. Get help right away if:  You have symptoms of   very bad dehydration.  You cannot drink fluids without throwing up (vomiting).  Your symptoms get worse with treatment.  You have a fever.  You have a very bad headache.  You are throwing up or having watery poop (diarrhea) and it: ? Gets worse. ? Does not go away.  You have blood or something green (bile) in your throw-up.  You have blood in your poop (stool). This may cause poop to look black and tarry.  You have not peed in 6-8 hours.  You pass out (faint).  Your heart rate when you are sitting still is more than 100 beats a  minute.  You have trouble breathing. This information is not intended to replace advice given to you by your health care provider. Make sure you discuss any questions you have with your health care provider. Document Released: 12/11/2008 Document Revised: 09/04/2015 Document Reviewed: 04/10/2015 [employer] Interactive Patient Education  2018 [employer]  

## 2017-11-14 ENCOUNTER — Encounter: Payer: Self-pay | Admitting: Adult Health

## 2017-11-14 ENCOUNTER — Inpatient Hospital Stay (HOSPITAL_BASED_OUTPATIENT_CLINIC_OR_DEPARTMENT_OTHER): Payer: 59 | Admitting: Adult Health

## 2017-11-14 ENCOUNTER — Inpatient Hospital Stay: Payer: 59

## 2017-11-14 ENCOUNTER — Telehealth: Payer: Self-pay | Admitting: Adult Health

## 2017-11-14 VITALS — BP 103/63 | HR 68 | Temp 97.9°F | Resp 18 | Ht 68.5 in

## 2017-11-14 DIAGNOSIS — Z170 Estrogen receptor positive status [ER+]: Secondary | ICD-10-CM

## 2017-11-14 DIAGNOSIS — R197 Diarrhea, unspecified: Secondary | ICD-10-CM | POA: Diagnosis not present

## 2017-11-14 DIAGNOSIS — C50412 Malignant neoplasm of upper-outer quadrant of left female breast: Secondary | ICD-10-CM

## 2017-11-14 DIAGNOSIS — Z95828 Presence of other vascular implants and grafts: Secondary | ICD-10-CM

## 2017-11-14 DIAGNOSIS — Z5112 Encounter for antineoplastic immunotherapy: Secondary | ICD-10-CM | POA: Diagnosis not present

## 2017-11-14 DIAGNOSIS — C773 Secondary and unspecified malignant neoplasm of axilla and upper limb lymph nodes: Secondary | ICD-10-CM

## 2017-11-14 LAB — CBC WITH DIFFERENTIAL/PLATELET
Basophils Absolute: 0.5 10*3/uL — ABNORMAL HIGH (ref 0.0–0.1)
Basophils Relative: 2 %
Eosinophils Absolute: 0 10*3/uL (ref 0.0–0.5)
Eosinophils Relative: 0 %
HCT: 34.9 % (ref 34.8–46.6)
Hemoglobin: 11.1 g/dL — ABNORMAL LOW (ref 11.6–15.9)
Lymphocytes Relative: 7 %
Lymphs Abs: 1.3 10*3/uL (ref 0.9–3.3)
MCH: 28 pg (ref 25.1–34.0)
MCHC: 31.8 g/dL (ref 31.5–36.0)
MCV: 87.9 fL (ref 79.5–101.0)
Monocytes Absolute: 0.3 10*3/uL (ref 0.1–0.9)
Monocytes Relative: 1 %
Neutro Abs: 17.2 10*3/uL — ABNORMAL HIGH (ref 1.5–6.5)
Neutrophils Relative %: 90 %
Platelets: 169 10*3/uL (ref 145–400)
RBC: 3.97 MIL/uL (ref 3.70–5.45)
RDW: 19 % — ABNORMAL HIGH (ref 11.2–14.5)
WBC: 19.2 10*3/uL — ABNORMAL HIGH (ref 3.9–10.3)

## 2017-11-14 LAB — COMPREHENSIVE METABOLIC PANEL
ALT: 23 U/L (ref 0–44)
AST: 13 U/L — ABNORMAL LOW (ref 15–41)
Albumin: 4.1 g/dL (ref 3.5–5.0)
Alkaline Phosphatase: 137 U/L — ABNORMAL HIGH (ref 38–126)
Anion gap: 7 (ref 5–15)
BUN: 22 mg/dL (ref 8–23)
CO2: 26 mmol/L (ref 22–32)
Calcium: 9.7 mg/dL (ref 8.9–10.3)
Chloride: 100 mmol/L (ref 98–111)
Creatinine, Ser: 0.81 mg/dL (ref 0.44–1.00)
GFR calc Af Amer: >60 mL/min (ref >60)
GFR calc non Af Amer: >60 mL/min (ref >60)
Glucose, Bld: 132 mg/dL — ABNORMAL HIGH (ref 70–99)
Potassium: 4.1 mmol/L (ref 3.5–5.1)
Sodium: 133 mmol/L — ABNORMAL LOW (ref 135–145)
Total Bilirubin: 1.3 mg/dL — ABNORMAL HIGH (ref 0.3–1.2)
Total Protein: 7.3 g/dL (ref 6.5–8.1)

## 2017-11-14 MED ORDER — SODIUM CHLORIDE 0.9 % IV SOLN
INTRAVENOUS | Status: DC
  Administered 2017-11-14: 14:00:00 via INTRAVENOUS
  Filled 2017-11-14 (×2): qty 250

## 2017-11-14 MED ORDER — SODIUM CHLORIDE 0.9% FLUSH
10.0000 mL | Freq: Once | INTRAVENOUS | Status: AC
  Administered 2017-11-14: 10 mL
  Filled 2017-11-14: qty 10

## 2017-11-14 MED ORDER — HEPARIN SOD (PORK) LOCK FLUSH 100 UNIT/ML IV SOLN
500.0000 [IU] | Freq: Once | INTRAVENOUS | Status: AC
  Administered 2017-11-14: 500 [IU]
  Filled 2017-11-14: qty 5

## 2017-11-14 NOTE — Telephone Encounter (Signed)
Gave avs and calendar ° °

## 2017-11-14 NOTE — Patient Instructions (Signed)
Dehydration, Adult Dehydration is when there is not enough fluid or water in your body. This happens when you lose more fluids than you take in. Dehydration can range from mild to very bad. It should be treated right away to keep it from getting very bad. Symptoms of mild dehydration may include:  Thirst.  Dry lips.  Slightly dry mouth.  Dry, warm skin.  Dizziness. Symptoms of moderate dehydration may include:  Very dry mouth.  Muscle cramps.  Dark pee (urine). Pee may be the color of tea.  Your body making less pee.  Your eyes making fewer tears.  Heartbeat that is uneven or faster than normal (palpitations).  Headache.  Light-headedness, especially when you stand up from sitting.  Fainting (syncope). Symptoms of very bad dehydration may include:  Changes in skin, such as: ? Cold and clammy skin. ? Blotchy (mottled) or pale skin. ? Skin that does not quickly return to normal after being lightly pinched and let go (poor skin turgor).  Changes in body fluids, such as: ? Feeling very thirsty. ? Your eyes making fewer tears. ? Not sweating when body temperature is high, such as in hot weather. ? Your body making very little pee.  Changes in vital signs, such as: ? Weak pulse. ? Pulse that is more than 100 beats a minute when you are sitting still. ? Fast breathing. ? Low blood pressure.  Other changes, such as: ? Sunken eyes. ? Cold hands and feet. ? Confusion. ? Lack of energy (lethargy). ? Trouble waking up from sleep. ? Short-term weight loss. ? Unconsciousness. Follow these instructions at home:  If told by your doctor, drink an ORS: ? Make an ORS by using instructions on the package. ? Start by drinking small amounts, about  cup (120 mL) every 5-10 minutes. ? Slowly drink more until you have had the amount that your doctor said to have.  Drink enough clear fluid to keep your pee clear or pale yellow. If you were told to drink an ORS, finish the ORS  first, then start slowly drinking clear fluids. Drink fluids such as: ? Water. Do not drink only water by itself. Doing that can make the salt (sodium) level in your body get too low (hyponatremia). ? Ice chips. ? Fruit juice that you have added water to (diluted). ? Low-calorie sports drinks.  Avoid: ? Alcohol. ? Drinks that have a lot of sugar. These include high-calorie sports drinks, fruit juice that does not have water added, and soda. ? Caffeine. ? Foods that are greasy or have a lot of fat or sugar.  Take over-the-counter and prescription medicines only as told by your doctor.  Do not take salt tablets. Doing that can make the salt level in your body get too high (hypernatremia).  Eat foods that have minerals (electrolytes). Examples include bananas, oranges, potatoes, tomatoes, and spinach.  Keep all follow-up visits as told by your doctor. This is important. Contact a doctor if:  You have belly (abdominal) pain that: ? Gets worse. ? Stays in one area (localizes).  You have a rash.  You have a stiff neck.  You get angry or annoyed more easily than normal (irritability).  You are more sleepy than normal.  You have a harder time waking up than normal.  You feel: ? Weak. ? Dizzy. ? Very thirsty.  You have peed (urinated) only a small amount of very dark pee during 6-8 hours. Get help right away if:  You have symptoms of   very bad dehydration.  You cannot drink fluids without throwing up (vomiting).  Your symptoms get worse with treatment.  You have a fever.  You have a very bad headache.  You are throwing up or having watery poop (diarrhea) and it: ? Gets worse. ? Does not go away.  You have blood or something green (bile) in your throw-up.  You have blood in your poop (stool). This may cause poop to look black and tarry.  You have not peed in 6-8 hours.  You pass out (faint).  Your heart rate when you are sitting still is more than 100 beats a  minute.  You have trouble breathing. This information is not intended to replace advice given to you by your health care provider. Make sure you discuss any questions you have with your health care provider. Document Released: 12/11/2008 Document Revised: 09/04/2015 Document Reviewed: 04/10/2015 [employer] Interactive Patient Education  2018 [employer]  

## 2017-11-14 NOTE — Progress Notes (Signed)
[facility]  [phone] [phone]     ID: Madison Hopkins DOB: 1955/11/18  MR#: [id number]  IWP#:[id number]  Patient Care Team: Donzetta Kohut as PCP - General (Physician Assistant) [name], MD as Consulting Physician (General Surgery) [name], [name] Dad, MD as Consulting Physician (Oncology) Kyung Rudd, MD as Consulting Physician (Radiation Oncology) [name], [name] All, MD as Consulting Physician (Obstetrics and Gynecology) Gentry Fitz, MD as Consulting Physician (Family Medicine) OTHER MD:  CHIEF COMPLAINT: HER-2 positive breast cancer  CURRENT TREATMENT: Neoadjuvant chemotherapy  INTERVAL HISTORY: Madison Hopkins returns today for follow-up of her HER-2 positive, weakly estrogen receptor positive breast cancer accompanied by her brother, son and daughter. She is receiving neoadjuvant chemotherapy with carboplatin, docetaxel, trastuzumab, and pertuzumab. Today is day 8 cycle 4. We did add back Pertuzumab with cycle 4.  We had held it with cycle 2 and 3 due to diarrhea.  REVIEW OF SYSTEMS: Madison Hopkins is feeling moderately well.  She is fatigued.  She received IV fluids on Saturday and feels like she would benefit from fluids again today.  She also started drinking Pedialyte.  She notes that she is doing better with this approach.  She had minimal diarrhea on Sunday evening and Monday, but nothing that was comparable to the diarrhea she experienced initially.  She denies any other issues such as fevers, chills, nausea, vomiting, constipation, headaches, vision issues, neuropathy, chest pain, palpitations or shortness of breath.  A detailed ROS was otherwise non contributory.    HISTORY OF CURRENT ILLNESS: From the original intake note:  Madison Hopkins noted a mass in the left axilla sometime in January or February 2019.  She eventually brought her to her gynecologist's attention, and underwent bilateral diagnostic mammography with  tomography and left breast ultrasonography at [facility] on 08/04/2017 showing: breast density category B. There is a highly suspicious hypoechoic mass in the left breast at the 2 o'clock upper outer quadrant measuring 2.3 x 1.6 x 2.2 cm, located 2 cm from the nipple. Sonographically, there were 2 enlarged lymph nodes in the left axilla, the largest with cortical thickening measuring 2.5 cm. No evidence of malignancy was seen in the right breast.   Accordingly on 08/07/2017 she proceeded to biopsy of the left breast area and 1 of the lymph nodes in question. The pathology from this procedure showed ([id number]): Invasive ductal carcinoma, grade 3. Metastatic carcinoma was found in one left axillary lymph node. Prognostic indicators significant for: estrogen receptor, 30% positive with weak staining intensity and progesterone receptor, 0% negative. Proliferation marker Ki67 at 80%. HER2 amplified with ratios HER2/CEP17 signals 2.32 and average HER2 copies per cell 6.60  The patient's subsequent history is as detailed below.   PAST MEDICAL HISTORY: Past Medical History:  Diagnosis Date  . Abnormal Pap smear of vagina 07/28/2016   LGSIL; colpo 07/2016 atrophic squamous cells; colpo 07/2017 - atypia  . Allergy   . Breast cancer ([place])    Metastatic Left breast  . Elevated hemoglobin A1c 07/28/2016   level - 6.1  . Endometriosis   . Low vitamin D level 07/28/2016   level 24.7  . STD (sexually transmitted disease)   GERD but no ulcers  PAST SURGICAL HISTORY: Past Surgical History:  Procedure Laterality Date  . ABDOMINAL HYSTERECTOMY    . CESAREAN SECTION    . COLON SURGERY    . OOPHORECTOMY    . PORTACATH PLACEMENT N/A 09/06/2017   Procedure: INSERTION PORT-A-CATH;  Surgeon: [name], MD;  Location: [facility];  Service: General;  Laterality: N/A;  . SMALL INTESTINE SURGERY    . SPINE SURGERY    Hysterectomy with salpingo-oophorectomy, Tonsillectomy, Plantar Fascitis Right Foot Surgery     FAMILY HISTORY Family History  Problem Relation Age of Onset  . Mental illness Mother   . Alcoholism Mother   . Cirrhosis Mother   . Cancer Father   . Lung cancer Father   The patient' father died at age 31 due to lung cancer (heavy smoker). The patient's mother died due to liver cirrhosis (heavy drinker). The patient has 2 brothers and no sisters. There was a paternal 1st cousin with colon cancer diagnosed in the mid 40's, who also had cervical cancer. The mother of this 1st cousin (the patient's paternal aunt) had cancer (the patient's is unsure of what type). There was also a paternal uncle with prostate cancer diagnosed in the 33's. The patient denies a family history of breast or ovarian cancer.     GYNECOLOGIC HISTORY:  No LMP recorded (lmp unknown). Patient has had a hysterectomy. Menarche: 62 years old Age at first live birth: 62 years old She is GXP2.  The patient is status post total hysterectomy with bilateral salpingo-oophorectomy in 1995.  She never used contraception. She notes that she had an estrogen shot one time but no other HRTs.    SOCIAL HISTORY:  The patient works in Therapist, art for the tax department. She is single. At home is herself and no pets. Her son, Madison Hopkins is age 36 and lives in [place], [place] as a Musician. The patient's daughter Madison Hopkins is age 70 and lives in [place] in Therapist, art for [employer]. The patient has 5 grandchildren and 1 great grandchild. She attends [employer].     ADVANCED DIRECTIVES: Not in place; at the 08/16/2017 visit the patient was given the appropriate forms to complete on notarized at her discretion   HEALTH MAINTENANCE: Social History   Tobacco Use  . Smoking status: Never Smoker  . Smokeless tobacco: Never Used  Substance Use Topics  . Alcohol use: Yes    Alcohol/week: 0.0 standard drinks    Comment: occasional  . Drug use: Yes    Types: Marijuana     Colonoscopy: 2009?  PAP: 07/31/2017/  positive for Atypical Squamous cells of uncertain significance.   Bone density: 10/24/2016 showed a T score of -0.7 (normal was (   Allergies  Allergen Reactions  . Penicillins Nausea Only    Has patient had a PCN reaction causing immediate rash, facial/tongue/throat swelling, SOB or lightheadedness with hypotension: No Has patient had a PCN reaction causing severe rash involving mucus membranes or skin necrosis: No Has patient had a PCN reaction that required hospitalization: No Has patient had a PCN reaction occurring within the last 10 years: Yes--nausea & headache ONLY If all of the above answers are "NO", then may proceed with Cephalosporin use.     Current Outpatient Medications  Medication Sig Dispense Refill  . acyclovir ointment (ZOVIRAX) 5 % Apply 1 application topically every 4 (four) hours. 15 g 2  . Artificial Tear Solution (OPTI-TEARS OP) Place 1 drop into both eyes 3 (three) times daily as needed (for dry/irritated eyes.).    Marland Kitchen cholestyramine (QUESTRAN) 4 GM/DOSE powder Take 1 packet (4 g total) by mouth 2 (two) times daily with a meal. 378 g 2  . dexamethasone (DECADRON) 4 MG tablet Take 2 tablets (8 mg total)  by mouth 2 (two) times daily. Start the day before Taxotere. Take once the day after, then 2 times a day x 2d. 30 tablet 1  . fluconazole (DIFLUCAN) 100 MG tablet TAKE DAILY FOR 5 DAYS STARTING ON CHEMO DAY 30 tablet 0  . fluticasone (FLONASE) 50 MCG/ACT nasal spray Place 1 spray into both nostrils daily as needed for allergies.    Marland Kitchen HYDROcodone-acetaminophen (NORCO) 5-325 MG tablet 1/2 to 1 tablets Q 4 hours prn pain 30 tablet 0  . hydrocortisone (ANUSOL-HC) 25 MG suppository Place 1 suppository (25 mg total) rectally 2 (two) times daily. 12 suppository 0  . lidocaine-prilocaine (EMLA) cream Apply to affected area once 30 g 3  . loperamide (IMODIUM) 2 MG capsule Take 1 capsule (2 mg total) by mouth as needed for diarrhea or loose stools. 30 capsule 0  . LORazepam  (ATIVAN) 0.5 MG tablet Take 1 tablet (0.5 mg total) by mouth at bedtime as needed (Nausea or vomiting). 30 tablet 1  . Misc Natural Products (OSTEO BI-FLEX ADV TRIPLE ST PO) Take 1 tablet by mouth daily after lunch.    . Multiple Vitamin (MULTIVITAMIN WITH MINERALS) TABS tablet Take 1 tablet by mouth daily after lunch.    . naproxen sodium (ALEVE) 220 MG tablet Take 220 mg by mouth 2 (two) times daily as needed (FOR PAIN.).     Marland Kitchen nitrofurantoin, macrocrystal-monohydrate, (MACROBID) 100 MG capsule Take 1 capsule (100 mg total) by mouth 2 (two) times daily. 20 capsule 0  . Nutritional Supplements (IMMUNE ENHANCE) TABS Take 1 tablet by mouth daily after lunch.    . prochlorperazine (COMPAZINE) 10 MG tablet Take 1 tablet (10 mg total) by mouth every 6 (six) hours as needed (Nausea or vomiting). 30 tablet 1  . tetrahydrozoline (VISINE) 0.05 % ophthalmic solution Place 1 drop into both eyes 3 (three) times daily as needed (for dry eyes.).    Marland Kitchen valACYclovir (VALTREX) 500 MG tablet Take 1 tablet (500 mg total) by mouth 2 (two) times daily. 60 tablet 2   Current Facility-Administered Medications  Medication Dose Route Frequency Provider Last Rate Last Dose  . 0.9 %  sodium chloride infusion   Intravenous Continuous Gardenia Phlegm, NP   Stopped at 11/14/17 1558    OBJECTIVE:  Vitals:   11/14/17 1335  BP: 103/63  Pulse: 68  Resp: 18  Temp: 97.9 F (36.6 C)  SpO2: 100%     Body mass index is 28.87 kg/m.   Wt Readings from Last 3 Encounters:  11/09/17 192 lb 11.2 oz (87.4 kg)  10/25/17 186 lb 12.8 oz (84.7 kg)  10/19/17 193 lb 6 oz (87.7 kg)  ECOG FS:1 - Symptomatic but completely ambulatory GENERAL: Patient is a well appearing female in no acute distress HEENT:  Sclerae anicteric.  Oropharynx clear and moist. No ulcerations or evidence of oropharyngeal candidiasis. Neck is supple.  NODES:  No cervical, supraclavicular, or axillary lymphadenopathy palpated.  BREAST EXAM:  Deferred.  Breast examined last week. LUNGS:  Clear to auscultation bilaterally.  No wheezes or rhonchi. HEART:  Regular rate and rhythm. No murmur appreciated. ABDOMEN:  Soft, nontender.  Positive, normoactive bowel sounds. No organomegaly palpated. MSK:  No focal spinal tenderness to palpation. Full range of motion bilaterally in the upper extremities. EXTREMITIES:  No peripheral edema.   SKIN:  Clear with no obvious rashes or skin changes. No nail dyscrasia. NEURO:  Nonfocal. Well oriented.  Appropriate affect.    LAB RESULTS:  CMP  Component Value Date/Time   NA 133 (L) 11/14/2017 1231   K 4.1 11/14/2017 1231   CL 100 11/14/2017 1231   CO2 26 11/14/2017 1231   GLUCOSE 132 (H) 11/14/2017 1231   BUN 22 11/14/2017 1231   CREATININE 0.81 11/14/2017 1231   CREATININE 0.81 10/25/2017 1055   CALCIUM 9.7 11/14/2017 1231   PROT 7.3 11/14/2017 1231   ALBUMIN 4.1 11/14/2017 1231   AST 13 (L) 11/14/2017 1231   AST 23 10/25/2017 1055   ALT 23 11/14/2017 1231   ALT 30 10/25/2017 1055   ALKPHOS 137 (H) 11/14/2017 1231   BILITOT 1.3 (H) 11/14/2017 1231   BILITOT 0.6 10/25/2017 1055   GFRNONAA >60 11/14/2017 1231   GFRNONAA >60 10/25/2017 1055   GFRAA >60 11/14/2017 1231   GFRAA >60 10/25/2017 1055    No results found for: TOTALPROTELP, ALBUMINELP, A1GS, A2GS, BETS, BETA2SER, GAMS, MSPIKE, SPEI  No results found for: KPAFRELGTCHN, LAMBDASER, KAPLAMBRATIO  Lab Results  Component Value Date   WBC 19.2 (H) 11/14/2017   NEUTROABS 17.2 (H) 11/14/2017   HGB 11.1 (L) 11/14/2017   HCT 34.9 11/14/2017   MCV 87.9 11/14/2017   PLT 169 11/14/2017    '@LASTCHEMISTRY'$ @  No results found for: LABCA2  No components found for: GUYQIH474  No results for input(s): INR in the last 168 hours.  No results found for: LABCA2  No results found for: QVZ563  No results found for: OVF643  No results found for: PIR518  No results found for: CA2729  No components found for: HGQUANT  No results  found for: CEA1 / No results found for: CEA1   No results found for: AFPTUMOR  No results found for: CHROMOGRNA  No results found for: PSA1  Appointment on 11/14/2017  Component Date Value Ref Range Status  . WBC 11/14/2017 19.2* 3.9 - 10.3 K/uL Final  . RBC 11/14/2017 3.97  3.70 - 5.45 MIL/uL Final  . Hemoglobin 11/14/2017 11.1* 11.6 - 15.9 g/dL Final  . HCT 11/14/2017 34.9  34.8 - 46.6 % Final  . MCV 11/14/2017 87.9  79.5 - 101.0 fL Final  . MCH 11/14/2017 28.0  25.1 - 34.0 pg Final  . MCHC 11/14/2017 31.8  31.5 - 36.0 g/dL Final  . RDW 11/14/2017 19.0* 11.2 - 14.5 % Final  . Platelets 11/14/2017 169  145 - 400 K/uL Final  . Neutrophils Relative % 11/14/2017 90  % Final  . Neutro Abs 11/14/2017 17.2* 1.5 - 6.5 K/uL Final  . Lymphocytes Relative 11/14/2017 7  % Final  . Lymphs Abs 11/14/2017 1.3  0.9 - 3.3 K/uL Final  . Monocytes Relative 11/14/2017 1  % Final  . Monocytes Absolute 11/14/2017 0.3  0.1 - 0.9 K/uL Final  . Eosinophils Relative 11/14/2017 0  % Final  . Eosinophils Absolute 11/14/2017 0.0  0.0 - 0.5 K/uL Final  . Basophils Relative 11/14/2017 2  % Final  . Basophils Absolute 11/14/2017 0.5* 0.0 - 0.1 K/uL Final   Performed at [facility], [place] [address], [place], [place] [address]  . Sodium 11/14/2017 133* 135 - 145 mmol/L Final  . Potassium 11/14/2017 4.1  3.5 - 5.1 mmol/L Final  . Chloride 11/14/2017 100  98 - 111 mmol/L Final  . CO2 11/14/2017 26  22 - 32 mmol/L Final  . Glucose, Bld 11/14/2017 132* 70 - 99 mg/dL Final  . BUN 11/14/2017 22  8 - 23 mg/dL Final  . Creatinine, Ser 11/14/2017 0.81  0.44 -  1.00 mg/dL Final  . Calcium 11/14/2017 9.7  8.9 - 10.3 mg/dL Final  . Total Protein 11/14/2017 7.3  6.5 - 8.1 g/dL Final  . Albumin 11/14/2017 4.1  3.5 - 5.0 g/dL Final  . AST 11/14/2017 13* 15 - 41 U/L Final  . ALT 11/14/2017 23  0 - 44 U/L Final  . Alkaline Phosphatase 11/14/2017 137* 38 - 126 U/L Final  . Total Bilirubin 11/14/2017  1.3* 0.3 - 1.2 mg/dL Final  . GFR calc non Af Amer 11/14/2017 >60  >60 mL/min Final  . GFR calc Af Amer 11/14/2017 >60  >60 mL/min Final   Comment: (NOTE) The eGFR has been calculated using the CKD EPI equation. This calculation has not been validated in all clinical situations. eGFR's persistently <60 mL/min signify possible Chronic Kidney Disease.   Georgiann Hahn gap 11/14/2017 7  5 - 15 Final   Performed at [facility], [address] [place], [place] [address]    (this displays the last labs from the last 3 days)  No results found for: TOTALPROTELP, ALBUMINELP, A1GS, A2GS, BETS, BETA2SER, GAMS, MSPIKE, SPEI (this displays SPEP labs)  No results found for: KPAFRELGTCHN, LAMBDASER, KAPLAMBRATIO (kappa/lambda light chains)  No results found for: HGBA, HGBA2QUANT, HGBFQUANT, HGBSQUAN (Hemoglobinopathy evaluation)   No results found for: LDH  No results found for: IRON, TIBC, IRONPCTSAT (Iron and TIBC)  No results found for: FERRITIN  Urinalysis    Component Value Date/Time   BILIRUBINUR negative 10/07/2017 0939   BILIRUBINUR n 08/25/2016 1407   KETONESUR negative 10/07/2017 0939   PROTEINUR =30 (A) 10/07/2017 0939   PROTEINUR n 08/25/2016 1407   UROBILINOGEN 1.0 10/07/2017 0939   NITRITE Positive (A) 10/07/2017 0939   NITRITE n 08/25/2016 1407   LEUKOCYTESUR Negative 10/07/2017 0939     STUDIES: No results found.  ELIGIBLE FOR AVAILABLE RESEARCH PROTOCOL: yes (see Research Studies)  ASSESSMENT: 62 y.o. [place], [place] woman status post left breast upper outer quadrant and left axillary lymph node biopsy 08/07/2017, both positive for a T2 N1, stage IIB invasive ductal carcinoma, grade 3, estrogen receptor weakly positive, progesterone receptor negative, but HER-2 amplified, with an MIB-1 of 80%  (a) staging chest CT and bone scan 08/30/2017 showed no evidence of metastatic disease  (1) neoadjuvant chemotherapy will consist of carboplatin,  docetaxel, trastuzumab, and Pertuzumab given every 21 days x 6 starting 09/07/2017, perjeta omitted with cycle 2 and 3 due to diarrhea  (2) trastuzumab will be continued to complete 6 months  (a) echocardiogram 08/28/2017 showed an ejection fraction in the 60-65% range.  (3) definitive surgery to follow  (4) adjuvant radiation to follow surgery  (5) will start antiestrogens at the completion of local therapy  PLAN:  Madison Hopkins is doing moderately well following her fourth cycle of Docetaxel, Carbo, Trastuzumab and Pertuzumab. Her labs are stable and I reviewed them with her in detail.  She seems to have tolerated the addition of Pertuzumab better with this cycle, and has experienced minimal diarrhea thus far which is very good.  We reviewed how to take imodium and she knows to call me if anything changes.  She tells me that receiving the fluids the Saturday after chemotherapy has helped a lot.  She would also like to receive IV fluids today.    Madison Hopkins will return in 2 weeks for labs, f/u and cycle 5 of chemotherapy.  She knows to call for any other issues that may develop before the next visit.   A  total of (30) minutes of face-to-face time was spent with this patient with greater than 50% of that time in counseling and care-coordination.   Wilber Bihari, NP  11/14/17 4:10 PM Medical Oncology and Hematology [facility] [address] [place], [place] [address] Tel. [phone]    Fax. [phone]

## 2017-11-17 ENCOUNTER — Other Ambulatory Visit (HOSPITAL_COMMUNITY): Payer: 59

## 2017-11-17 ENCOUNTER — Ambulatory Visit (HOSPITAL_COMMUNITY): Payer: 59 | Admitting: Cardiology

## 2017-11-27 DIAGNOSIS — C50912 Malignant neoplasm of unspecified site of left female breast: Secondary | ICD-10-CM | POA: Diagnosis not present

## 2017-11-27 DIAGNOSIS — Z9889 Other specified postprocedural states: Secondary | ICD-10-CM | POA: Diagnosis not present

## 2017-11-27 DIAGNOSIS — Z9049 Acquired absence of other specified parts of digestive tract: Secondary | ICD-10-CM | POA: Diagnosis not present

## 2017-11-29 ENCOUNTER — Ambulatory Visit (INDEPENDENT_AMBULATORY_CARE_PROVIDER_SITE_OTHER)
Admission: RE | Admit: 2017-11-29 | Discharge: 2017-11-29 | Disposition: A | Discharge status: Home / Self Care | Payer: 59 | Source: Ambulatory Visit | Attending: Internal Medicine | Admitting: Internal Medicine

## 2017-11-29 ENCOUNTER — Ambulatory Visit (HOSPITAL_COMMUNITY)
Admission: RE | Admit: 2017-11-29 | Discharge: 2017-11-29 | Disposition: A | Discharge status: Home / Self Care | Payer: 59 | Source: Ambulatory Visit | Attending: Internal Medicine | Admitting: Internal Medicine

## 2017-11-29 ENCOUNTER — Other Ambulatory Visit: Payer: Self-pay

## 2017-11-29 ENCOUNTER — Encounter (HOSPITAL_COMMUNITY): Payer: Self-pay | Admitting: Internal Medicine

## 2017-11-29 VITALS — BP 129/80 | HR 73 | Wt 193.0 lb

## 2017-11-29 DIAGNOSIS — Z79899 Other long term (current) drug therapy: Secondary | ICD-10-CM | POA: Insufficient documentation

## 2017-11-29 DIAGNOSIS — Z880 Allergy status to penicillin: Secondary | ICD-10-CM | POA: Insufficient documentation

## 2017-11-29 DIAGNOSIS — I10 Essential (primary) hypertension: Secondary | ICD-10-CM | POA: Diagnosis not present

## 2017-11-29 DIAGNOSIS — C50412 Malignant neoplasm of upper-outer quadrant of left female breast: Secondary | ICD-10-CM

## 2017-11-29 DIAGNOSIS — Z170 Estrogen receptor positive status [ER+]: Secondary | ICD-10-CM | POA: Diagnosis not present

## 2017-11-29 DIAGNOSIS — Z8379 Family history of other diseases of the digestive system: Secondary | ICD-10-CM | POA: Insufficient documentation

## 2017-11-29 DIAGNOSIS — E785 Hyperlipidemia, unspecified: Secondary | ICD-10-CM | POA: Diagnosis not present

## 2017-11-29 DIAGNOSIS — Z801 Family history of malignant neoplasm of trachea, bronchus and lung: Secondary | ICD-10-CM | POA: Diagnosis not present

## 2017-11-29 NOTE — Progress Notes (Signed)
  Echocardiogram 2D Echocardiogram has been performed.  Jannett Celestine 11/29/2017, 9:43 AM

## 2017-11-29 NOTE — Addendum Note (Signed)
Encounter addended by: Scarlette Calico, RN on: 11/29/2017 10:43 AM  Actions taken: Order list changed, Diagnosis association updated

## 2017-11-29 NOTE — Patient Instructions (Signed)
Your physician recommends that you schedule a follow-up appointment in: 3 months with echocardiogram  

## 2017-11-29 NOTE — Progress Notes (Signed)
CARDIO-ONCOLOGY CLINIC CONSULT NOTE  Referring Physician: Dr. Jana Hakim  HPI:  Madison Hopkins is 62 y.o. female with left breast cancer referred by Dr. Jana Hakim for enrollment into the Cardio-Oncology program during Herceptin therapy.  Denies any h/o heart disease, HTN, HL. Diagnosed with left breast CA 08/07/2017, both positive for a T2 N1, stage IIB invasive ductal carcinoma, grade 3, estrogen receptor weakly positive, progesterone receptor negative, but HER-2 amplified, with an MIB-1 of 80%             (a) staging chest CT and bone scan 08/30/2017 showed no evidence of metastatic disease  (1) neoadjuvant chemotherapy will consist of carboplatin, docetaxel, trastuzumab, and Pertuzumab given every 21 days x 6 starting 09/07/2017, perjeta omitted with cycle 2 and 3 due to diarrhea  (2) trastuzumab will be continued to complete 6 months             (a) echocardiogram 08/28/2017 showed an ejection fraction in the 60-65% range.  (3) definitive surgery to follow  (4) adjuvant radiation to follow surgery  (5) will start antiestrogens at the completion of local therapy   Has finished four cycles of Docetaxel, Carbo, Trastuzumab and Pertuzumab. Her labs are stable and I reviewed them with her in detail.  She seems to have tolerated the addition of Pertuzumab better with this cycle, and has experienced minimal diarrhea thus far which is very good. Denies CP, SOB or LE edema.  Echo 08/28/17 EF 60-65% GLS -23.7% Echo today EF 60-65% GLS -14.6 % (underestimated due to poor tracking.     Review of Systems: [y] = yes, _0  = no   General: Weight gain _1 ; Weight loss _2 ; Anorexia _3 ; Fatigue _4 ; Fever _5 ; Chills _6 ; Weakness _7   Cardiac: Chest pain/pressure _8 ; Resting SOB _9 ; Exertional SOB _10 ; Orthopnea _11 ; Pedal Edema _12 ; Palpitations _13 ; Syncope _14 ; Presyncope _15 ; Paroxysmal nocturnal dyspnea_16   Pulmonary: Cough _17 ; Wheezing_18 ; Hemoptysis_19 ; Sputum _20 ; Snoring _21   GI:  Vomiting_22 ; Dysphagia_23 ; Melena_24 ; Hematochezia _25 ; Heartburn_26 ; Abdominal pain _27 ; Constipation _28 ; Diarrhea _29 ; BRBPR _30   GU: Hematuria_31 ; Dysuria _32 ; Nocturia_33   Vascular: Pain in legs with walking _34 ; Pain in feet with lying flat _35 ; Non-healing sores _36 ; Stroke _37 ; TIA _38 ; Slurred speech _39 ;  Neuro: Headaches_40 ; Vertigo_41 ; Seizures_42 ; Paresthesias_43 ;Blurred vision _44 ; Diplopia _45 ; Vision changes _46   Ortho/Skin: Arthritis _47 ; Joint pain _48 ; Muscle pain _49 ; Joint swelling _50 ; Back Pain _51 ; Rash _52   Psych: Depression_53 ; Anxiety_54   Heme: Bleeding problems _55 ; Clotting disorders _56 ; Anemia _57   Endocrine: Diabetes _58 ; Thyroid dysfunction_59    Past Medical History:  Diagnosis Date  . Abnormal Pap smear of vagina 07/28/2016   LGSIL; colpo 07/2016 atrophic squamous cells; colpo 07/2017 - atypia  . Allergy   . Breast cancer ([place])    Metastatic Left breast  . Elevated hemoglobin A1c 07/28/2016   level - 6.1  . Endometriosis   . Low vitamin D level 07/28/2016   level 24.7  . STD (sexually transmitted disease)     Current Outpatient Medications  Medication Sig Dispense Refill  . acyclovir ointment (ZOVIRAX) 5 % Apply 1 application topically every 4 (four) hours. 15 g 2  . Artificial Tear  Solution (OPTI-TEARS OP) Place 1 drop into both eyes 3 (three) times daily as needed (for dry/irritated eyes.).    Marland Kitchen cholestyramine (QUESTRAN) 4 GM/DOSE powder Take 1 packet (4 g total) by mouth 2 (two) times daily with a meal. 378 g 2  . dexamethasone (DECADRON) 4 MG tablet Take 2 tablets (8 mg total) by mouth 2 (two) times daily. Start the day before Taxotere. Take once the day after, then 2 times a day x 2d. 30 tablet 1  . fluconazole (DIFLUCAN) 100 MG tablet TAKE DAILY FOR 5 DAYS STARTING ON CHEMO DAY 30 tablet 0  . fluticasone (FLONASE) 50 MCG/ACT nasal spray Place 1 spray into both nostrils daily as needed for allergies.    Marland Kitchen HYDROcodone-acetaminophen (NORCO) 5-325 MG  tablet 1/2 to 1 tablets Q 4 hours prn pain 30 tablet 0  . hydrocortisone (ANUSOL-HC) 25 MG suppository Place 1 suppository (25 mg total) rectally 2 (two) times daily. 12 suppository 0  . lidocaine-prilocaine (EMLA) cream Apply to affected area once 30 g 3  . loperamide (IMODIUM) 2 MG capsule Take 1 capsule (2 mg total) by mouth as needed for diarrhea or loose stools. 30 capsule 0  . LORazepam (ATIVAN) 0.5 MG tablet Take 1 tablet (0.5 mg total) by mouth at bedtime as needed (Nausea or vomiting). 30 tablet 1  . Misc Natural Products (OSTEO BI-FLEX ADV TRIPLE ST PO) Take 1 tablet by mouth daily after lunch.    . Multiple Vitamin (MULTIVITAMIN WITH MINERALS) TABS tablet Take 1 tablet by mouth daily after lunch.    . naproxen sodium (ALEVE) 220 MG tablet Take 220 mg by mouth 2 (two) times daily as needed (FOR PAIN.).     Marland Kitchen nitrofurantoin, macrocrystal-monohydrate, (MACROBID) 100 MG capsule Take 1 capsule (100 mg total) by mouth 2 (two) times daily. 20 capsule 0  . Nutritional Supplements (IMMUNE ENHANCE) TABS Take 1 tablet by mouth daily after lunch.    . prochlorperazine (COMPAZINE) 10 MG tablet Take 1 tablet (10 mg total) by mouth every 6 (six) hours as needed (Nausea or vomiting). 30 tablet 1  . tetrahydrozoline (VISINE) 0.05 % ophthalmic solution Place 1 drop into both eyes 3 (three) times daily as needed (for dry eyes.).    Marland Kitchen valACYclovir (VALTREX) 500 MG tablet Take 1 tablet (500 mg total) by mouth 2 (two) times daily. 60 tablet 2   No current facility-administered medications for this encounter.     Allergies  Allergen Reactions  . Penicillins Nausea Only    Has patient had a PCN reaction causing immediate rash, facial/tongue/throat swelling, SOB or lightheadedness with hypotension: No Has patient had a PCN reaction causing severe rash involving mucus membranes or skin necrosis: No Has patient had a PCN reaction that required hospitalization: No Has patient had a PCN reaction occurring  within the last 10 years: Yes--nausea & headache ONLY If all of the above answers are "NO", then may proceed with Cephalosporin use.       Social History   Socioeconomic History  . Marital status: Single    Spouse name: Not on file  . Number of children: 2  . Years of education: Not on file  . Highest education level: Not on file  Occupational History  . Not on file  Social Needs  . Financial resource strain: Not on file  . Food insecurity:    Worry: Not on file    Inability: Not on file  . Transportation needs:    Medical: Not on file  Non-medical: Not on file  Tobacco Use  . Smoking status: Never Smoker  . Smokeless tobacco: Never Used  Substance and Sexual Activity  . Alcohol use: Yes    Alcohol/week: 0.0 standard drinks    Comment: occasional  . Drug use: Yes    Types: Marijuana  . Sexual activity: Not Currently    Birth control/protection: Post-menopausal, Surgical    Comment: Hysterectomy  Lifestyle  . Physical activity:    Days per week: Not on file    Minutes per session: Not on file  . Stress: Not on file  Relationships  . Social connections:    Talks on phone: Not on file    Gets together: Not on file    Attends religious service: Not on file    Active member of club or organization: Not on file    Attends meetings of clubs or organizations: Not on file    Relationship status: Not on file  . Intimate partner violence:    Fear of current or ex partner: Not on file    Emotionally abused: Not on file    Physically abused: Not on file    Forced sexual activity: Not on file  Other Topics Concern  . Not on file  Social History Narrative  . Not on file      Family History  Problem Relation Age of Onset  . Mental illness Mother   . Alcoholism Mother   . Cirrhosis Mother   . Cancer Father   . Lung cancer Father     Vitals:   11/29/17 0943  BP: 129/80  Pulse: 73  SpO2: 100%  Weight: 87.5 kg (193 lb)    PHYSICAL EXAM: General:  Well  appearing. No respiratory difficulty HEENT: normal Neck: supple. no JVD. Carotids 2+ bilat; no bruits. No lymphadenopathy or thryomegaly appreciated. Cor: PMI nondisplaced. Regular rate & rhythm. No rubs, gallops or murmurs. R port a cath Lungs: clear Abdomen: soft, nontender, nondistended. No hepatosplenomegaly. No bruits or masses. Good bowel sounds. Extremities: no cyanosis, clubbing, rash, edema Neuro: alert & oriented x 3, cranial nerves grossly intact. moves all 4 extremities w/o difficulty. Affect pleasant.    ASSESSMENT & PLAN: 1. Left Breast Cancer - left breast CA 08/07/2017, both positive for a T2 N1, stage IIB invasive ductal carcinoma, grade 3, estrogen receptor weakly positive, progesterone receptor negative, but HER-2 amplified, with an MIB-1 of 80% -Explained incidence of Herceptin cardiotoxicity and role of Cardio-oncology clinic at length. Echo images reviewed personally. All parameters stable. Reviewed signs and symptoms of HF to look for. Continue Herceptin. Follow-up with echo in 3 months.   Glori Bickers, MD  10:21 AM

## 2017-11-29 NOTE — Addendum Note (Signed)
Encounter addended by: Scarlette Calico, RN on: 11/29/2017 10:36 AM  Actions taken: Sign clinical note

## 2017-11-30 ENCOUNTER — Telehealth: Payer: Self-pay | Admitting: Adult Health

## 2017-11-30 ENCOUNTER — Encounter: Payer: Self-pay | Admitting: *Deleted

## 2017-11-30 ENCOUNTER — Inpatient Hospital Stay: Payer: 59

## 2017-11-30 ENCOUNTER — Encounter: Payer: Self-pay | Admitting: Adult Health

## 2017-11-30 ENCOUNTER — Inpatient Hospital Stay: Payer: 59 | Attending: Adult Health

## 2017-11-30 ENCOUNTER — Inpatient Hospital Stay (HOSPITAL_BASED_OUTPATIENT_CLINIC_OR_DEPARTMENT_OTHER): Payer: 59 | Admitting: Adult Health

## 2017-11-30 VITALS — BP 132/88 | HR 75 | Temp 98.7°F | Resp 18 | Wt 192.6 lb

## 2017-11-30 DIAGNOSIS — Z5111 Encounter for antineoplastic chemotherapy: Secondary | ICD-10-CM | POA: Insufficient documentation

## 2017-11-30 DIAGNOSIS — C50412 Malignant neoplasm of upper-outer quadrant of left female breast: Secondary | ICD-10-CM

## 2017-11-30 DIAGNOSIS — Z95828 Presence of other vascular implants and grafts: Secondary | ICD-10-CM

## 2017-11-30 DIAGNOSIS — Z170 Estrogen receptor positive status [ER+]: Principal | ICD-10-CM

## 2017-11-30 DIAGNOSIS — C773 Secondary and unspecified malignant neoplasm of axilla and upper limb lymph nodes: Secondary | ICD-10-CM | POA: Diagnosis not present

## 2017-11-30 LAB — CBC WITH DIFFERENTIAL/PLATELET
Basophils Absolute: 0 10*3/uL (ref 0.0–0.1)
Basophils Relative: 0 %
Eosinophils Absolute: 0 10*3/uL (ref 0.0–0.5)
Eosinophils Relative: 0 %
HCT: 31 % — ABNORMAL LOW (ref 34.8–46.6)
Hemoglobin: 10.1 g/dL — ABNORMAL LOW (ref 11.6–15.9)
Lymphocytes Relative: 20 %
Lymphs Abs: 1.2 10*3/uL (ref 0.9–3.3)
MCH: 29 pg (ref 25.1–34.0)
MCHC: 32.5 g/dL (ref 31.5–36.0)
MCV: 89.3 fL (ref 79.5–101.0)
Monocytes Absolute: 0.5 10*3/uL (ref 0.1–0.9)
Monocytes Relative: 8 %
Neutro Abs: 4.4 10*3/uL (ref 1.5–6.5)
Neutrophils Relative %: 72 %
Platelets: 202 10*3/uL (ref 145–400)
RBC: 3.47 MIL/uL — ABNORMAL LOW (ref 3.70–5.45)
RDW: 20.4 % — ABNORMAL HIGH (ref 11.2–14.5)
WBC: 6.2 10*3/uL (ref 3.9–10.3)

## 2017-11-30 LAB — COMPREHENSIVE METABOLIC PANEL
ALT: 24 U/L (ref 0–44)
AST: 13 U/L — ABNORMAL LOW (ref 15–41)
Albumin: 3.7 g/dL (ref 3.5–5.0)
Alkaline Phosphatase: 130 U/L — ABNORMAL HIGH (ref 38–126)
Anion gap: 9 (ref 5–15)
BUN: 17 mg/dL (ref 8–23)
CO2: 24 mmol/L (ref 22–32)
Calcium: 10 mg/dL (ref 8.9–10.3)
Chloride: 107 mmol/L (ref 98–111)
Creatinine, Ser: 0.79 mg/dL (ref 0.44–1.00)
GFR calc Af Amer: >60 mL/min (ref >60)
GFR calc non Af Amer: >60 mL/min (ref >60)
Glucose, Bld: 154 mg/dL — ABNORMAL HIGH (ref 70–99)
Potassium: 3.7 mmol/L (ref 3.5–5.1)
Sodium: 140 mmol/L (ref 135–145)
Total Bilirubin: 0.4 mg/dL (ref 0.3–1.2)
Total Protein: 7.2 g/dL (ref 6.5–8.1)

## 2017-11-30 MED ORDER — SODIUM CHLORIDE 0.9% FLUSH
10.0000 mL | Freq: Once | INTRAVENOUS | Status: AC
  Administered 2017-11-30: 10 mL
  Filled 2017-11-30: qty 10

## 2017-11-30 MED ORDER — SODIUM CHLORIDE 0.9 % IV SOLN
Freq: Once | INTRAVENOUS | Status: AC
  Administered 2017-11-30: 13:00:00 via INTRAVENOUS
  Filled 2017-11-30: qty 250

## 2017-11-30 MED ORDER — DIPHENHYDRAMINE HCL 25 MG PO CAPS
ORAL_CAPSULE | ORAL | Status: AC
  Filled 2017-11-30: qty 1

## 2017-11-30 MED ORDER — TRASTUZUMAB CHEMO 150 MG IV SOLR
6.0000 mg/kg | Freq: Once | INTRAVENOUS | Status: AC
  Administered 2017-11-30: 546 mg via INTRAVENOUS
  Filled 2017-11-30: qty 26

## 2017-11-30 MED ORDER — ACETAMINOPHEN 325 MG PO TABS
ORAL_TABLET | ORAL | Status: AC
  Filled 2017-11-30: qty 2

## 2017-11-30 MED ORDER — SODIUM CHLORIDE 0.9 % IV SOLN
420.0000 mg | Freq: Once | INTRAVENOUS | Status: AC
  Administered 2017-11-30: 420 mg via INTRAVENOUS
  Filled 2017-11-30: qty 14

## 2017-11-30 MED ORDER — SODIUM CHLORIDE 0.9 % IV SOLN
800.0000 mg/m2 | Freq: Once | INTRAVENOUS | Status: AC
  Administered 2017-11-30: 1672 mg via INTRAVENOUS
  Filled 2017-11-30: qty 43.97

## 2017-11-30 MED ORDER — PEGFILGRASTIM 6 MG/0.6ML ~~LOC~~ PSKT
6.0000 mg | PREFILLED_SYRINGE | Freq: Once | SUBCUTANEOUS | Status: AC
  Administered 2017-11-30: 6 mg via SUBCUTANEOUS

## 2017-11-30 MED ORDER — PALONOSETRON HCL INJECTION 0.25 MG/5ML
INTRAVENOUS | Status: AC
  Filled 2017-11-30: qty 5

## 2017-11-30 MED ORDER — PEGFILGRASTIM 6 MG/0.6ML ~~LOC~~ PSKT
PREFILLED_SYRINGE | SUBCUTANEOUS | Status: AC
  Filled 2017-11-30: qty 0.6

## 2017-11-30 MED ORDER — HEPARIN SOD (PORK) LOCK FLUSH 100 UNIT/ML IV SOLN
500.0000 [IU] | Freq: Once | INTRAVENOUS | Status: AC | PRN
  Administered 2017-11-30: 500 [IU]
  Filled 2017-11-30: qty 5

## 2017-11-30 MED ORDER — DIPHENHYDRAMINE HCL 25 MG PO CAPS
25.0000 mg | ORAL_CAPSULE | Freq: Once | ORAL | Status: AC
  Administered 2017-11-30: 25 mg via ORAL

## 2017-11-30 MED ORDER — PALONOSETRON HCL INJECTION 0.25 MG/5ML
0.2500 mg | Freq: Once | INTRAVENOUS | Status: AC
  Administered 2017-11-30: 0.25 mg via INTRAVENOUS

## 2017-11-30 MED ORDER — SODIUM CHLORIDE 0.9% FLUSH
10.0000 mL | INTRAVENOUS | Status: DC | PRN
  Administered 2017-11-30: 10 mL
  Filled 2017-11-30: qty 10

## 2017-11-30 MED ORDER — SODIUM CHLORIDE 0.9 % IV SOLN
650.0000 mg | Freq: Once | INTRAVENOUS | Status: AC
  Administered 2017-11-30: 650 mg via INTRAVENOUS
  Filled 2017-11-30: qty 65

## 2017-11-30 MED ORDER — ACETAMINOPHEN 325 MG PO TABS
650.0000 mg | ORAL_TABLET | Freq: Once | ORAL | Status: AC
  Administered 2017-11-30: 650 mg via ORAL

## 2017-11-30 MED ORDER — SODIUM CHLORIDE 0.9 % IV SOLN
Freq: Once | INTRAVENOUS | Status: AC
  Administered 2017-11-30: 13:00:00 via INTRAVENOUS
  Filled 2017-11-30: qty 5

## 2017-11-30 NOTE — Telephone Encounter (Signed)
Gave patient avs and calendar.   °

## 2017-11-30 NOTE — Telephone Encounter (Signed)
Patient wanted to leave IVF appt on 10/8.

## 2017-11-30 NOTE — Patient Instructions (Signed)
Madison Hopkins Discharge Instructions for Patients Receiving Chemotherapy  Today you received the following chemotherapy agents:  trastuzumab (Herceptin), pertuzumab (Perjeta), gemcitabine (Gemzar), and carboplatin (Paraplatin).  To help prevent nausea and vomiting after your treatment, we encourage you to take your nausea medication as directed.   If you develop nausea and vomiting that is not controlled by your nausea medication, call the clinic.   BELOW ARE SYMPTOMS THAT SHOULD BE REPORTED IMMEDIATELY:  *FEVER GREATER THAN 100.5 F  *CHILLS WITH OR WITHOUT FEVER  NAUSEA AND VOMITING THAT IS NOT CONTROLLED WITH YOUR NAUSEA MEDICATION  *UNUSUAL SHORTNESS OF BREATH  *UNUSUAL BRUISING OR BLEEDING  TENDERNESS IN MOUTH AND THROAT WITH OR WITHOUT PRESENCE OF ULCERS  *URINARY PROBLEMS  *BOWEL PROBLEMS  UNUSUAL RASH Items with * indicate a potential emergency and should be followed up as soon as possible.  Feel free to call the clinic should you have any questions or concerns. The clinic phone number is [phone].  Please show the [place] at check-in to the Emergency Department and triage nurse.  Gemcitabine injection What is this medicine? GEMCITABINE (jem SIT a been) is a chemotherapy drug. This medicine is used to treat many types of cancer like breast cancer, lung cancer, pancreatic cancer, and ovarian cancer. This medicine may be used for other purposes; ask your health care provider or pharmacist if you have questions. COMMON BRAND NAME(S): Gemzar What should I tell my health care provider before I take this medicine? They need to know if you have any of these conditions: -blood disorders -infection -kidney disease -liver disease -recent or ongoing radiation therapy -an unusual or allergic reaction to gemcitabine, other chemotherapy, other medicines, foods, dyes, or preservatives -pregnant or trying to get pregnant -breast-feeding How should I  use this medicine? This drug is given as an infusion into a vein. It is administered in a hospital or clinic by a specially trained health care professional. Talk to your pediatrician regarding the use of this medicine in children. Special care may be needed. Overdosage: If you think you have taken too much of this medicine contact a poison control center or emergency room at once. NOTE: This medicine is only for you. Do not share this medicine with others. What if I miss a dose? It is important not to miss your dose. Call your doctor or health care professional if you are unable to keep an appointment. What may interact with this medicine? -medicines to increase blood counts like filgrastim, pegfilgrastim, sargramostim -some other chemotherapy drugs like cisplatin -vaccines Talk to your doctor or health care professional before taking any of these medicines: -acetaminophen -aspirin -ibuprofen -ketoprofen -naproxen This list may not describe all possible interactions. Give your health care provider a list of all the medicines, herbs, non-prescription drugs, or dietary supplements you use. Also tell them if you smoke, drink alcohol, or use illegal drugs. Some items may interact with your medicine. What should I watch for while using this medicine? Visit your doctor for checks on your progress. This drug may make you feel generally unwell. This is not uncommon, as chemotherapy can affect healthy cells as well as cancer cells. Report any side effects. Continue your course of treatment even though you feel ill unless your doctor tells you to stop. In some cases, you may be given additional medicines to help with side effects. Follow all directions for their use. Call your doctor or health care professional for advice if you get a fever, chills or  sore throat, or other symptoms of a cold or flu. Do not treat yourself. This drug decreases your body's ability to fight infections. Try to avoid being  around people who are sick. This medicine may increase your risk to bruise or bleed. Call your doctor or health care professional if you notice any unusual bleeding. Be careful brushing and flossing your teeth or using a toothpick because you may get an infection or bleed more easily. If you have any dental work done, tell your dentist you are receiving this medicine. Avoid taking products that contain aspirin, acetaminophen, ibuprofen, naproxen, or ketoprofen unless instructed by your doctor. These medicines may hide a fever. Women should inform their doctor if they wish to become pregnant or think they might be pregnant. There is a potential for serious side effects to an unborn child. Talk to your health care professional or pharmacist for more information. Do not breast-feed an infant while taking this medicine. What side effects may I notice from receiving this medicine? Side effects that you should report to your doctor or health care professional as soon as possible: -allergic reactions like skin rash, itching or hives, swelling of the face, lips, or tongue -low blood counts - this medicine may decrease the number of white blood cells, red blood cells and platelets. You may be at increased risk for infections and bleeding. -signs of infection - fever or chills, cough, sore throat, pain or difficulty passing urine -signs of decreased platelets or bleeding - bruising, pinpoint red spots on the skin, black, tarry stools, blood in the urine -signs of decreased red blood cells - unusually weak or tired, fainting spells, lightheadedness -breathing problems -chest pain -mouth sores -nausea and vomiting -pain, swelling, redness at site where injected -pain, tingling, numbness in the hands or feet -stomach pain -swelling of ankles, feet, hands -unusual bleeding Side effects that usually do not require medical attention (report to your doctor or health care professional if they continue or are  bothersome): -constipation -diarrhea -hair loss -loss of appetite -stomach upset This list may not describe all possible side effects. Call your doctor for medical advice about side effects. You may report side effects to FDA at 1-800-FDA-1088. Where should I keep my medicine? This drug is given in a hospital or clinic and will not be stored at home. NOTE: This sheet is a summary. It may not cover all possible information. If you have questions about this medicine, talk to your doctor, pharmacist, or health care provider.  2018 [employer] Standard (2007-06-26 18:45:54)

## 2017-11-30 NOTE — Progress Notes (Signed)
[facility]  [phone] [phone]     ID: Madison Hopkins DOB: Jul 02, 1955  MR#: [id number]  WYO#:[id number]  Patient Care Team: Donzetta Kohut as PCP - General (Physician Assistant) [name], MD as Consulting Physician (General Surgery) [name], [name] Dad, MD as Consulting Physician (Oncology) Kyung Rudd, MD as Consulting Physician (Radiation Oncology) [name], [name] All, MD as Consulting Physician (Obstetrics and Gynecology) Gentry Fitz, MD as Consulting Physician (Family Medicine) OTHER MD:  CHIEF COMPLAINT: HER-2 positive breast cancer  CURRENT TREATMENT: Neoadjuvant chemotherapy  INTERVAL HISTORY: Madison Hopkins returns today for follow-up of her HER-2 positive, weakly estrogen receptor positive breast cancer accompanied by her friend [name]. She is receiving neoadjuvant chemotherapy with carboplatin, docetaxel, trastuzumab, and pertuzumab. Today is day 1 cycle 5. We did add back Pertuzumab with cycle 4.  We had held it with cycle 2 and 3 due to diarrhea.  She tolerated this well.    Since her last appointment Madison Hopkins met with Dr. Dalbert Batman. She really liked him and his presence, and she is looking forward to him doing her surgery.  She also met with Dr. Haroldine Laws and had a repeat echo.    REVIEW OF SYSTEMS: Madison Hopkins is feeling moderately well. She notes a numbness in her feet on the anterior portion.  She denies any balance issues. She denies any numbness on the tips of her fingers or toes, or on the soles of her feet or palms of her hands.  No motor changes noted.  She denies any other issues such as fevers, chills, headaches, vision changes, chest pain, palpitations, cough, shortness of breath, or any other issues.  A detailed ROS was otherwise non contributory.     HISTORY OF CURRENT ILLNESS: From the original intake note:  Madison Hopkins noted a mass in the left axilla sometime in January or February 2019.  She eventually  brought her to her gynecologist's attention, and underwent bilateral diagnostic mammography with tomography and left breast ultrasonography at [facility] on 08/04/2017 showing: breast density category B. There is a highly suspicious hypoechoic mass in the left breast at the 2 o'clock upper outer quadrant measuring 2.3 x 1.6 x 2.2 cm, located 2 cm from the nipple. Sonographically, there were 2 enlarged lymph nodes in the left axilla, the largest with cortical thickening measuring 2.5 cm. No evidence of malignancy was seen in the right breast.   Accordingly on 08/07/2017 she proceeded to biopsy of the left breast area and 1 of the lymph nodes in question. The pathology from this procedure showed ([id number]): Invasive ductal carcinoma, grade 3. Metastatic carcinoma was found in one left axillary lymph node. Prognostic indicators significant for: estrogen receptor, 30% positive with weak staining intensity and progesterone receptor, 0% negative. Proliferation marker Ki67 at 80%. HER2 amplified with ratios HER2/CEP17 signals 2.32 and average HER2 copies per cell 6.60  The patient's subsequent history is as detailed below.   PAST MEDICAL HISTORY: Past Medical History:  Diagnosis Date  . Abnormal Pap smear of vagina 07/28/2016   LGSIL; colpo 07/2016 atrophic squamous cells; colpo 07/2017 - atypia  . Allergy   . Breast cancer ([facility])    Metastatic Left breast  . Elevated hemoglobin A1c 07/28/2016   level - 6.1  . Endometriosis   . Low vitamin D level 07/28/2016   level 24.7  . STD (sexually transmitted disease)   GERD but no ulcers  PAST SURGICAL HISTORY: Past Surgical History:  Procedure Laterality Date  .  ABDOMINAL HYSTERECTOMY    . CESAREAN SECTION    . COLON SURGERY    . OOPHORECTOMY    . PORTACATH PLACEMENT N/A 09/06/2017   Procedure: INSERTION PORT-A-CATH;  Surgeon: [name], MD;  Location: [facility];  Service: General;  Laterality: N/A;  . SMALL INTESTINE SURGERY    . SPINE  SURGERY    Hysterectomy with salpingo-oophorectomy, Tonsillectomy, Plantar Fascitis Right Foot Surgery    FAMILY HISTORY Family History  Problem Relation Age of Onset  . Mental illness Mother   . Alcoholism Mother   . Cirrhosis Mother   . Cancer Father   . Lung cancer Father   The patient' father died at age 54 due to lung cancer (heavy smoker). The patient's mother died due to liver cirrhosis (heavy drinker). The patient has 2 brothers and no sisters. There was a paternal 1st cousin with colon cancer diagnosed in the mid 40's, who also had cervical cancer. The mother of this 1st cousin (the patient's paternal aunt) had cancer (the patient's is unsure of what type). There was also a paternal uncle with prostate cancer diagnosed in the 55's. The patient denies a family history of breast or ovarian cancer.     GYNECOLOGIC HISTORY:  No LMP recorded (lmp unknown). Patient has had a hysterectomy. Menarche: 62 years old Age at first live birth: 62 years old She is GXP2.  The patient is status post total hysterectomy with bilateral salpingo-oophorectomy in 1995.  She never used contraception. She notes that she had an estrogen shot one time but no other HRTs.    SOCIAL HISTORY:  The patient works in Therapist, art for the tax department. She is single. At home is herself and no pets. Her son, Madison Hopkins is age 25 and lives in [place], [place] as a Musician. The patient's daughter Madison Hopkins is age 41 and lives in [place] in Therapist, art for [employer]. The patient has 5 grandchildren and 1 great grandchild. She attends [employer].     ADVANCED DIRECTIVES: Not in place; at the 08/16/2017 visit the patient was given the appropriate forms to complete on notarized at her discretion   HEALTH MAINTENANCE: Social History   Tobacco Use  . Smoking status: Never Smoker  . Smokeless tobacco: Never Used  Substance Use Topics  . Alcohol use: Yes    Alcohol/week: 0.0 standard drinks     Comment: occasional  . Drug use: Yes    Types: Marijuana     Colonoscopy: 2009?  PAP: 07/31/2017/ positive for Atypical Squamous cells of uncertain significance.   Bone density: 10/24/2016 showed a T score of -0.7 (normal was (   Allergies  Allergen Reactions  . Penicillins Nausea Only    Has patient had a PCN reaction causing immediate rash, facial/tongue/throat swelling, SOB or lightheadedness with hypotension: No Has patient had a PCN reaction causing severe rash involving mucus membranes or skin necrosis: No Has patient had a PCN reaction that required hospitalization: No Has patient had a PCN reaction occurring within the last 10 years: Yes--nausea & headache ONLY If all of the above answers are "NO", then may proceed with Cephalosporin use.     Current Outpatient Medications  Medication Sig Dispense Refill  . acyclovir ointment (ZOVIRAX) 5 % Apply 1 application topically every 4 (four) hours. 15 g 2  . Artificial Tear Solution (OPTI-TEARS OP) Place 1 drop into both eyes 3 (three) times daily as needed (for dry/irritated eyes.).    Marland Kitchen cholestyramine (QUESTRAN) 4  GM/DOSE powder Take 1 packet (4 g total) by mouth 2 (two) times daily with a meal. 378 g 2  . dexamethasone (DECADRON) 4 MG tablet Take 2 tablets (8 mg total) by mouth 2 (two) times daily. Start the day before Taxotere. Take once the day after, then 2 times a day x 2d. 30 tablet 1  . fluconazole (DIFLUCAN) 100 MG tablet TAKE DAILY FOR 5 DAYS STARTING ON CHEMO DAY 30 tablet 0  . fluticasone (FLONASE) 50 MCG/ACT nasal spray Place 1 spray into both nostrils daily as needed for allergies.    Marland Kitchen HYDROcodone-acetaminophen (NORCO) 5-325 MG tablet 1/2 to 1 tablets Q 4 hours prn pain 30 tablet 0  . hydrocortisone (ANUSOL-HC) 25 MG suppository Place 1 suppository (25 mg total) rectally 2 (two) times daily. 12 suppository 0  . lidocaine-prilocaine (EMLA) cream Apply to affected area once 30 g 3  . loperamide (IMODIUM) 2 MG capsule Take  1 capsule (2 mg total) by mouth as needed for diarrhea or loose stools. 30 capsule 0  . LORazepam (ATIVAN) 0.5 MG tablet Take 1 tablet (0.5 mg total) by mouth at bedtime as needed (Nausea or vomiting). 30 tablet 1  . Misc Natural Products (OSTEO BI-FLEX ADV TRIPLE ST PO) Take 1 tablet by mouth daily after lunch.    . Multiple Vitamin (MULTIVITAMIN WITH MINERALS) TABS tablet Take 1 tablet by mouth daily after lunch.    . naproxen sodium (ALEVE) 220 MG tablet Take 220 mg by mouth 2 (two) times daily as needed (FOR PAIN.).     Marland Kitchen nitrofurantoin, macrocrystal-monohydrate, (MACROBID) 100 MG capsule Take 1 capsule (100 mg total) by mouth 2 (two) times daily. 20 capsule 0  . Nutritional Supplements (IMMUNE ENHANCE) TABS Take 1 tablet by mouth daily after lunch.    . prochlorperazine (COMPAZINE) 10 MG tablet Take 1 tablet (10 mg total) by mouth every 6 (six) hours as needed (Nausea or vomiting). 30 tablet 1  . tetrahydrozoline (VISINE) 0.05 % ophthalmic solution Place 1 drop into both eyes 3 (three) times daily as needed (for dry eyes.).    Marland Kitchen valACYclovir (VALTREX) 500 MG tablet Take 1 tablet (500 mg total) by mouth 2 (two) times daily. 60 tablet 2   No current facility-administered medications for this visit.     OBJECTIVE:  Vitals:   11/30/17 1031  BP: 132/88  Pulse: 75  Resp: 18  Temp: 98.7 F (37.1 C)  SpO2: 100%     Body mass index is 28.85 kg/m.   Wt Readings from Last 3 Encounters:  11/30/17 192 lb 9 oz (87.3 kg)  11/29/17 193 lb (87.5 kg)  11/09/17 192 lb 11.2 oz (87.4 kg)  ECOG FS:1 - Symptomatic but completely ambulatory GENERAL: Patient is a well appearing female in no acute distress HEENT:  Sclerae anicteric.  Oropharynx clear and moist. No ulcerations or evidence of oropharyngeal candidiasis. Neck is supple.  NODES:  No cervical, supraclavicular, or axillary lymphadenopathy palpated.  BREAST EXAM:  No mass in left breast noted LUNGS:  Clear to auscultation bilaterally.  No  wheezes or rhonchi. HEART:  Regular rate and rhythm. No murmur appreciated. ABDOMEN:  Soft, nontender.  Positive, normoactive bowel sounds. No organomegaly palpated. MSK:  No focal spinal tenderness to palpation. Full range of motion bilaterally in the upper extremities. EXTREMITIES:  No peripheral edema.   SKIN:  Clear with no obvious rashes or skin changes. No nail dyscrasia. NEURO:  Nonfocal. Well oriented.  Appropriate affect.    LAB RESULTS:  CMP     Component Value Date/Time   NA 133 (L) 11/14/2017 1231   K 4.1 11/14/2017 1231   CL 100 11/14/2017 1231   CO2 26 11/14/2017 1231   GLUCOSE 132 (H) 11/14/2017 1231   BUN 22 11/14/2017 1231   CREATININE 0.81 11/14/2017 1231   CREATININE 0.81 10/25/2017 1055   CALCIUM 9.7 11/14/2017 1231   PROT 7.3 11/14/2017 1231   ALBUMIN 4.1 11/14/2017 1231   AST 13 (L) 11/14/2017 1231   AST 23 10/25/2017 1055   ALT 23 11/14/2017 1231   ALT 30 10/25/2017 1055   ALKPHOS 137 (H) 11/14/2017 1231   BILITOT 1.3 (H) 11/14/2017 1231   BILITOT 0.6 10/25/2017 1055   GFRNONAA >60 11/14/2017 1231   GFRNONAA >60 10/25/2017 1055   GFRAA >60 11/14/2017 1231   GFRAA >60 10/25/2017 1055    No results found for: TOTALPROTELP, ALBUMINELP, A1GS, A2GS, BETS, BETA2SER, GAMS, MSPIKE, SPEI  No results found for: KPAFRELGTCHN, LAMBDASER, KAPLAMBRATIO  Lab Results  Component Value Date   WBC 6.2 11/30/2017   NEUTROABS 4.4 11/30/2017   HGB 10.1 (L) 11/30/2017   HCT 31.0 (L) 11/30/2017   MCV 89.3 11/30/2017   PLT 202 11/30/2017    '@LASTCHEMISTRY'$ @  No results found for: LABCA2  No components found for: [id number]  No results for input(s): INR in the last 168 hours.  No results found for: LABCA2  No results found for: HYW737  No results found for: TGG269  No results found for: SWN462  No results found for: CA2729  No components found for: HGQUANT  No results found for: CEA1 / No results found for: CEA1   No results found for:  AFPTUMOR  No results found for: CHROMOGRNA  No results found for: PSA1  Appointment on 11/30/2017  Component Date Value Ref Range Status  . WBC 11/30/2017 6.2  3.9 - 10.3 K/uL Final  . RBC 11/30/2017 3.47* 3.70 - 5.45 MIL/uL Final  . Hemoglobin 11/30/2017 10.1* 11.6 - 15.9 g/dL Final  . HCT 11/30/2017 31.0* 34.8 - 46.6 % Final  . MCV 11/30/2017 89.3  79.5 - 101.0 fL Final  . MCH 11/30/2017 29.0  25.1 - 34.0 pg Final  . MCHC 11/30/2017 32.5  31.5 - 36.0 g/dL Final  . RDW 11/30/2017 20.4* 11.2 - 14.5 % Final  . Platelets 11/30/2017 202  145 - 400 K/uL Final  . Neutrophils Relative % 11/30/2017 72  % Final  . Neutro Abs 11/30/2017 4.4  1.5 - 6.5 K/uL Final  . Lymphocytes Relative 11/30/2017 20  % Final  . Lymphs Abs 11/30/2017 1.2  0.9 - 3.3 K/uL Final  . Monocytes Relative 11/30/2017 8  % Final  . Monocytes Absolute 11/30/2017 0.5  0.1 - 0.9 K/uL Final  . Eosinophils Relative 11/30/2017 0  % Final  . Eosinophils Absolute 11/30/2017 0.0  0.0 - 0.5 K/uL Final  . Basophils Relative 11/30/2017 0  % Final  . Basophils Absolute 11/30/2017 0.0  0.0 - 0.1 K/uL Final   Performed at [facility], [place] [address], [place], [place] [address]    (this displays the last labs from the last 3 days)  No results found for: TOTALPROTELP, ALBUMINELP, A1GS, A2GS, BETS, BETA2SER, GAMS, MSPIKE, SPEI (this displays SPEP labs)  No results found for: KPAFRELGTCHN, LAMBDASER, KAPLAMBRATIO (kappa/lambda light chains)  No results found for: HGBA, HGBA2QUANT, HGBFQUANT, HGBSQUAN (Hemoglobinopathy evaluation)   No results found for: LDH  No results found for: IRON, TIBC, IRONPCTSAT (Iron  and TIBC)  No results found for: FERRITIN  Urinalysis    Component Value Date/Time   BILIRUBINUR negative 10/07/2017 0939   BILIRUBINUR n 08/25/2016 1407   KETONESUR negative 10/07/2017 0939   PROTEINUR =30 (A) 10/07/2017 0939   PROTEINUR n 08/25/2016 1407   UROBILINOGEN 1.0 10/07/2017  0939   NITRITE Positive (A) 10/07/2017 0939   NITRITE n 08/25/2016 1407   LEUKOCYTESUR Negative 10/07/2017 0939     STUDIES: No results found.  ELIGIBLE FOR AVAILABLE RESEARCH PROTOCOL: yes (see Research Studies)  ASSESSMENT: 62 y.o. [place], [place] woman status post left breast upper outer quadrant and left axillary lymph node biopsy 08/07/2017, both positive for a T2 N1, stage IIB invasive ductal carcinoma, grade 3, estrogen receptor weakly positive, progesterone receptor negative, but HER-2 amplified, with an MIB-1 of 80%  (a) staging chest CT and bone scan 08/30/2017 showed no evidence of metastatic disease  (1) neoadjuvant chemotherapy will consist of carboplatin, docetaxel, trastuzumab, and Pertuzumab given every 21 days x 6 starting 09/07/2017, perjeta omitted with cycle 2 and 3 due to diarrhea  (2) trastuzumab will be continued to complete 6 months  (a) echocardiogram 08/28/2017 showed an ejection fraction in the 60-65% range.  (b) echocardiogram on 11/29/2017 showed an EF of 55-60%  (3) definitive surgery to follow  (4) adjuvant radiation to follow surgery  (5) will start antiestrogens at the completion of local therapy  PLAN:  Suzzette is doing well today.  I reviewed her CBC today which was stable.  She will proceed with treatment today with Gemcitabine, Carboplatin, Trastuzumab, and Pertuzumab.  Due to this odd tingling sensation we are removing the Docetaxel and replacing with Gemcitabine per Dr. Jana Hakim.  I reviewed this in detail with Angellina and she is aware.  We reviewed Gemcitabine in detail and I also re reviewed her anti emetic regimen.    Moon and I reviewed her appointments today.  She and I reviewed upcoming IV fluid appointments on Saturday and Tuesday.  She would like to come in on Saturday and Monday instead.  I went ahead and ordered the IV fluids, and she will have the dates changed at scheduling for her infusion appointments.    Isidra and I again reviewed her  chemotherapy plan in detail.  We reviewed the difference between immunotherapy and chemotherapy.  I reviewed that with her HER-2 positive disease she will get adjuvant anti HER-2 treatment regardless.  I briefly reviewed that there was a study Belenda Cruise) that found that a different type of HER-2 treatment may be more beneficial based on her surgery results.  We reviewed that we will talk more about that once she has surgery, and right now we are focusing on getting rid of the cancer with her current treatment.     I ordered Danyella's MRI and let her navigator know that she wants it on 12/29/17 and not sooner.  She says she typically feels poorly the week after chemotherapy and would prefer no testing during that week.  We reviewed the timing of all of her treatments and potential date ranges for surgery.    Jaelyn has two fluid appointments in the coming week, and will return in 1 week for labs and f/u.  She knows to call for any questions or concerns prior to her next appointment with [place].    A total of (30) minutes of face-to-face time was spent with this patient with greater than 50% of that time in counseling and care-coordination.   Wilber Bihari, NP  11/30/17 11:03 AM Medical Oncology and Hematology [facility] [address] [place], [place] [address] Tel. [phone]    Fax. [phone]

## 2017-12-02 ENCOUNTER — Inpatient Hospital Stay: Payer: 59

## 2017-12-02 DIAGNOSIS — Z170 Estrogen receptor positive status [ER+]: Principal | ICD-10-CM

## 2017-12-02 DIAGNOSIS — Z5111 Encounter for antineoplastic chemotherapy: Secondary | ICD-10-CM | POA: Diagnosis not present

## 2017-12-02 DIAGNOSIS — C50412 Malignant neoplasm of upper-outer quadrant of left female breast: Secondary | ICD-10-CM

## 2017-12-02 MED ORDER — SODIUM CHLORIDE 0.9% FLUSH
10.0000 mL | Freq: Once | INTRAVENOUS | Status: AC
  Administered 2017-12-02: 10 mL via INTRAVENOUS
  Filled 2017-12-02: qty 10

## 2017-12-02 MED ORDER — SODIUM CHLORIDE 0.9 % IV SOLN
INTRAVENOUS | Status: DC
  Administered 2017-12-02: 08:00:00 via INTRAVENOUS
  Filled 2017-12-02 (×2): qty 250

## 2017-12-02 MED ORDER — HEPARIN SOD (PORK) LOCK FLUSH 100 UNIT/ML IV SOLN
500.0000 [IU] | Freq: Once | INTRAVENOUS | Status: AC
  Administered 2017-12-02: 500 [IU] via INTRAVENOUS
  Filled 2017-12-02: qty 5

## 2017-12-02 NOTE — Patient Instructions (Signed)
Dehydration, Adult Dehydration is a condition in which there is not enough fluid or water in the body. This happens when you lose more fluids than you take in. Important organs, such as the kidneys, brain, and heart, cannot function without a proper amount of fluids. Any loss of fluids from the body can lead to dehydration. Dehydration can range from mild to severe. This condition should be treated right away to prevent it from becoming severe. What are the causes? This condition may be caused by:  Vomiting.  Diarrhea.  Excessive sweating, such as from heat exposure or exercise.  Not drinking enough fluid, especially: ? When ill. ? While doing activity that requires a lot of energy.  Excessive urination.  Fever.  Infection.  Certain medicines, such as medicines that cause the body to lose excess fluid (diuretics).  Inability to access safe drinking water.  Reduced physical ability to get adequate water and food.  What increases the risk? This condition is more likely to develop in people:  Who have a poorly controlled long-term (chronic) illness, such as diabetes, heart disease, or kidney disease.  Who are age 65 or older.  Who are disabled.  Who live in a place with high altitude.  Who play endurance sports.  What are the signs or symptoms? Symptoms of mild dehydration may include:  Thirst.  Dry lips.  Slightly dry mouth.  Dry, warm skin.  Dizziness. Symptoms of moderate dehydration may include:  Very dry mouth.  Muscle cramps.  Dark urine. Urine may be the color of tea.  Decreased urine production.  Decreased tear production.  Heartbeat that is irregular or faster than normal (palpitations).  Headache.  Light-headedness, especially when you stand up from a sitting position.  Fainting (syncope). Symptoms of severe dehydration may include:  Changes in skin, such as: ? Cold and clammy skin. ? Blotchy (mottled) or pale skin. ? Skin that does  not quickly return to normal after being lightly pinched and released (poor skin turgor).  Changes in body fluids, such as: ? Extreme thirst. ? No tear production. ? Inability to sweat when body temperature is high, such as in hot weather. ? Very little urine production.  Changes in vital signs, such as: ? Weak pulse. ? Pulse that is more than 100 beats a minute when sitting still. ? Rapid breathing. ? Low blood pressure.  Other changes, such as: ? Sunken eyes. ? Cold hands and feet. ? Confusion. ? Lack of energy (lethargy). ? Difficulty waking up from sleep. ? Short-term weight loss. ? Unconsciousness. How is this diagnosed? This condition is diagnosed based on your symptoms and a physical exam. Blood and urine tests may be done to help confirm the diagnosis. How is this treated? Treatment for this condition depends on the severity. Mild or moderate dehydration can often be treated at home. Treatment should be started right away. Do not wait until dehydration becomes severe. Severe dehydration is an emergency and it needs to be treated in a hospital. Treatment for mild dehydration may include:  Drinking more fluids.  Replacing salts and minerals in your blood (electrolytes) that you may have lost. Treatment for moderate dehydration may include:  Drinking an oral rehydration solution (ORS). This is a drink that helps you replace fluids and electrolytes (rehydrate). It can be found at pharmacies and retail stores. Treatment for severe dehydration may include:  Receiving fluids through an IV tube.  Receiving an electrolyte solution through a feeding tube that is passed through your nose   and into your stomach (nasogastric tube, or NG tube).  Correcting any abnormalities in electrolytes.  Treating the underlying cause of dehydration. Follow these instructions at home:  If directed by your health care provider, drink an ORS: ? Make an ORS by following instructions on the  package. ? Start by drinking small amounts, about  cup (120 mL) every 5-10 minutes. ? Slowly increase how much you drink until you have taken the amount recommended by your health care provider.  Drink enough clear fluid to keep your urine clear or pale yellow. If you were told to drink an ORS, finish the ORS first, then start slowly drinking other clear fluids. Drink fluids such as: ? Water. Do not drink only water. Doing that can lead to having too little salt (sodium) in the body (hyponatremia). ? Ice chips. ? Fruit juice that you have added water to (diluted fruit juice). ? Low-calorie sports drinks.  Avoid: ? Alcohol. ? Drinks that contain a lot of sugar. These include high-calorie sports drinks, fruit juice that is not diluted, and soda. ? Caffeine. ? Foods that are greasy or contain a lot of fat or sugar.  Take over-the-counter and prescription medicines only as told by your health care provider.  Do not take sodium tablets. This can lead to having too much sodium in the body (hypernatremia).  Eat foods that contain a healthy balance of electrolytes, such as bananas, oranges, potatoes, tomatoes, and spinach.  Keep all follow-up visits as told by your health care provider. This is important. Contact a health care provider if:  You have abdominal pain that: ? Gets worse. ? Stays in one area (localizes).  You have a rash.  You have a stiff neck.  You are more irritable than usual.  You are sleepier or more difficult to wake up than usual.  You feel weak or dizzy.  You feel very thirsty.  You have urinated only a small amount of very dark urine over 6-8 hours. Get help right away if:  You have symptoms of severe dehydration.  You cannot drink fluids without vomiting.  Your symptoms get worse with treatment.  You have a fever.  You have a severe headache.  You have vomiting or diarrhea that: ? Gets worse. ? Does not go away.  You have blood or green matter  (bile) in your vomit.  You have blood in your stool. This may cause stool to look black and tarry.  You have not urinated in 6-8 hours.  You faint.  Your heart rate while sitting still is over 100 beats a minute.  You have trouble breathing. This information is not intended to replace advice given to you by your health care provider. Make sure you discuss any questions you have with your health care provider. Document Released: 02/14/2005 Document Revised: 09/11/2015 Document Reviewed: 04/10/2015 Elsevier Interactive Patient Education  2018 [employer]  

## 2017-12-02 NOTE — Progress Notes (Signed)
No blood return noted at end of infusion.   Port flushes well. Upon palpation needle appears to be in center of port. Pt does not want to stay for cathflo procedure at this time. Port locked with Heparin, needle removed and pt discharged home.

## 2017-12-05 ENCOUNTER — Inpatient Hospital Stay (HOSPITAL_BASED_OUTPATIENT_CLINIC_OR_DEPARTMENT_OTHER): Payer: 59 | Admitting: Adult Health

## 2017-12-05 ENCOUNTER — Inpatient Hospital Stay: Payer: 59

## 2017-12-05 ENCOUNTER — Encounter: Payer: Self-pay | Admitting: Adult Health

## 2017-12-05 ENCOUNTER — Telehealth: Payer: Self-pay | Admitting: Adult Health

## 2017-12-05 VITALS — BP 106/68 | HR 63 | Temp 98.3°F | Resp 18 | Ht 68.5 in | Wt 194.6 lb

## 2017-12-05 DIAGNOSIS — Z5111 Encounter for antineoplastic chemotherapy: Secondary | ICD-10-CM | POA: Diagnosis not present

## 2017-12-05 DIAGNOSIS — C50412 Malignant neoplasm of upper-outer quadrant of left female breast: Secondary | ICD-10-CM

## 2017-12-05 DIAGNOSIS — Z95828 Presence of other vascular implants and grafts: Secondary | ICD-10-CM

## 2017-12-05 DIAGNOSIS — Z170 Estrogen receptor positive status [ER+]: Secondary | ICD-10-CM

## 2017-12-05 DIAGNOSIS — C773 Secondary and unspecified malignant neoplasm of axilla and upper limb lymph nodes: Secondary | ICD-10-CM | POA: Diagnosis not present

## 2017-12-05 LAB — CBC WITH DIFFERENTIAL/PLATELET
Basophils Absolute: 0 10*3/uL (ref 0.0–0.1)
Basophils Relative: 0 %
Eosinophils Absolute: 0 10*3/uL (ref 0.0–0.5)
Eosinophils Relative: 0 %
HCT: 31.1 % — ABNORMAL LOW (ref 34.8–46.6)
Hemoglobin: 9.7 g/dL — ABNORMAL LOW (ref 11.6–15.9)
Lymphocytes Relative: 24 %
Lymphs Abs: 4.3 10*3/uL — ABNORMAL HIGH (ref 0.9–3.3)
MCH: 28.7 pg (ref 25.1–34.0)
MCHC: 31.2 g/dL — ABNORMAL LOW (ref 31.5–36.0)
MCV: 92 fL (ref 79.5–101.0)
Monocytes Absolute: 0.1 10*3/uL (ref 0.1–0.9)
Monocytes Relative: 1 %
Neutro Abs: 13.5 10*3/uL — ABNORMAL HIGH (ref 1.5–6.5)
Neutrophils Relative %: 75 %
Platelets: 215 10*3/uL (ref 145–400)
RBC: 3.38 MIL/uL — ABNORMAL LOW (ref 3.70–5.45)
RDW: 18.1 % — ABNORMAL HIGH (ref 11.2–14.5)
WBC: 18 10*3/uL — ABNORMAL HIGH (ref 3.9–10.3)
nRBC: 0 % (ref 0.0–0.2)

## 2017-12-05 LAB — COMPREHENSIVE METABOLIC PANEL
ALT: 23 U/L (ref 0–44)
AST: 17 U/L (ref 15–41)
Albumin: 3.6 g/dL (ref 3.5–5.0)
Alkaline Phosphatase: 133 U/L — ABNORMAL HIGH (ref 38–126)
Anion gap: 8 (ref 5–15)
BUN: 23 mg/dL (ref 8–23)
CO2: 28 mmol/L (ref 22–32)
Calcium: 9.2 mg/dL (ref 8.9–10.3)
Chloride: 103 mmol/L (ref 98–111)
Creatinine, Ser: 0.72 mg/dL (ref 0.44–1.00)
GFR calc Af Amer: >60 mL/min (ref >60)
GFR calc non Af Amer: >60 mL/min (ref >60)
Glucose, Bld: 115 mg/dL — ABNORMAL HIGH (ref 70–99)
Potassium: 3.2 mmol/L — ABNORMAL LOW (ref 3.5–5.1)
Sodium: 139 mmol/L (ref 135–145)
Total Bilirubin: 0.8 mg/dL (ref 0.3–1.2)
Total Protein: 6.3 g/dL — ABNORMAL LOW (ref 6.5–8.1)

## 2017-12-05 MED ORDER — HEPARIN SOD (PORK) LOCK FLUSH 100 UNIT/ML IV SOLN
500.0000 [IU] | Freq: Once | INTRAVENOUS | Status: AC
  Administered 2017-12-05: 500 [IU]
  Filled 2017-12-05: qty 5

## 2017-12-05 MED ORDER — SODIUM CHLORIDE 0.9% FLUSH
10.0000 mL | Freq: Once | INTRAVENOUS | Status: AC
  Administered 2017-12-05: 10 mL
  Filled 2017-12-05: qty 10

## 2017-12-05 MED ORDER — SODIUM CHLORIDE 0.9 % IV SOLN
INTRAVENOUS | Status: DC
  Administered 2017-12-05: 10:00:00 via INTRAVENOUS
  Filled 2017-12-05 (×2): qty 250

## 2017-12-05 NOTE — Telephone Encounter (Signed)
No 10/8 los orders.

## 2017-12-05 NOTE — Progress Notes (Signed)
[place]  [phone] [phone]     ID: Madison Hopkins DOB: [birth year]  MR#: [id number]  ONG#:[id number]  Patient Care Team: Madison Hopkins as PCP - General (Physician Assistant) Madison Overall, Hopkins as Consulting Physician (General Surgery) [name], [name] Dad, Hopkins as Consulting Physician (Oncology) Madison Rudd, Hopkins as Consulting Physician (Radiation Oncology) Madison Hopkins, Madison All, Hopkins as Consulting Physician (Obstetrics and Gynecology) Madison Fitz, Hopkins as Consulting Physician (Family Medicine) Madison Hopkins:  CHIEF COMPLAINT: HER-2 positive breast cancer  CURRENT TREATMENT: Neoadjuvant chemotherapy  INTERVAL HISTORY: Madison Hopkins returns today for follow-up of her HER-2 positive, weakly estrogen receptor positive breast cancer accompanied by her friend Madison Hopkins. She is receiving neoadjuvant chemotherapy with carboplatin, docetaxel, trastuzumab, and pertuzumab. Today is day 8 cycle 5. We did add back Pertuzumab with cycle 4.  We had held it with cycle 2 and 3 due to diarrhea.  She tolerated this well.     REVIEW OF SYSTEMS: Madison Hopkins is doing well today.  She notes that she received fluids Saturday and will receive them today.  She did have some numbness on the tops of her feet bilaterally.  This has resolved.  She has had a loose bowel movement daily.  Nothing has happened similar to the diarrhea she experienced with her first cycle.  She is eating and drinking well.  She denies nausea/vomiting.    Madison Hopkins is without any headaches or vision changes.  She isn't having cough, shortness of breath, chest pain, or palpitations.  A detailed ROS was conducted and was otherwise non contributory today.   HISTORY OF CURRENT ILLNESS: From the original intake note:  Madison Hopkins noted a mass in the left axilla sometime in January or February 2019.  She eventually brought her to her gynecologist's attention, and underwent bilateral diagnostic mammography  with tomography and left breast ultrasonography at [facility] on 08/04/2017 showing: breast density category B. There is a highly suspicious hypoechoic mass in the left breast at the 2 o'clock upper outer quadrant measuring 2.3 x 1.6 x 2.2 cm, located 2 cm from the nipple. Sonographically, there were 2 enlarged lymph nodes in the left axilla, the largest with cortical thickening measuring 2.5 cm. No evidence of malignancy was seen in the right breast.   Accordingly on 08/07/2017 she proceeded to biopsy of the left breast area and 1 of the lymph nodes in question. The pathology from this procedure showed ([id number]): Invasive ductal carcinoma, grade 3. Metastatic carcinoma was found in one left axillary lymph node. Prognostic indicators significant for: estrogen receptor, 30% positive with weak staining intensity and progesterone receptor, 0% negative. Proliferation marker Ki67 at 80%. HER2 amplified with ratios HER2/CEP17 signals 2.32 and average HER2 copies per cell 6.60  The patient's subsequent history is as detailed below.   PAST MEDICAL HISTORY: Past Medical History:  Diagnosis Date  . Abnormal Pap smear of vagina 07/28/2016   LGSIL; colpo 07/2016 atrophic squamous cells; colpo 07/2017 - atypia  . Allergy   . Breast cancer ([place])    Metastatic Left breast  . Elevated hemoglobin A1c 07/28/2016   level - 6.1  . Endometriosis   . Low vitamin D level 07/28/2016   level 24.7  . STD (sexually transmitted disease)   GERD but no ulcers  PAST SURGICAL HISTORY: Past Surgical History:  Procedure Laterality Date  . ABDOMINAL HYSTERECTOMY    . CESAREAN SECTION    . COLON SURGERY    . OOPHORECTOMY    .  PORTACATH PLACEMENT N/A 09/06/2017   Procedure: INSERTION PORT-A-CATH;  Surgeon: Madison Overall, Hopkins;  Location: [facility];  Service: General;  Laterality: N/A;  . SMALL INTESTINE SURGERY    . SPINE SURGERY    Hysterectomy with salpingo-oophorectomy, Tonsillectomy, Plantar Fascitis Right Foot  Surgery    FAMILY HISTORY Family History  Problem Relation Age of Onset  . Mental illness Mother   . Alcoholism Mother   . Cirrhosis Mother   . Cancer Father   . Lung cancer Father   The patient' father died at age 16 due to lung cancer (heavy smoker). The patient's mother died due to liver cirrhosis (heavy drinker). The patient has 2 brothers and no sisters. There was a paternal 1st cousin with colon cancer diagnosed in the mid 40's, who also had cervical cancer. The mother of this 1st cousin (the patient's paternal aunt) had cancer (the patient's is unsure of what type). There was also a paternal uncle with prostate cancer diagnosed in the 52's. The patient denies a family history of breast or ovarian cancer.     GYNECOLOGIC HISTORY:  No LMP recorded (lmp unknown). Patient has had a hysterectomy. Menarche: 62 years old Age at first live birth: 62 years old She is GXP2.  The patient is status post total hysterectomy with bilateral salpingo-oophorectomy in 1995.  She never used contraception. She notes that she had an estrogen shot one time but no Madison HRTs.    SOCIAL HISTORY:  The patient works in Therapist, art for the tax department. She is single. At home is herself and no pets. Her son, Madison Hopkins is age 34 and lives in [place], [place] as a Musician. The patient's daughter Madison Hopkins is age 58 and lives in [place] in Therapist, art for [employer]. The patient has 5 grandchildren and 1 great grandchild. She attends [employer].     ADVANCED DIRECTIVES: Not in place; at the 08/16/2017 visit the patient was given the appropriate forms to complete on notarized at her discretion   HEALTH MAINTENANCE: Social History   Tobacco Use  . Smoking status: Never Smoker  . Smokeless tobacco: Never Used  Substance Use Topics  . Alcohol use: Yes    Alcohol/week: 0.0 standard drinks    Comment: occasional  . Drug use: Yes    Types: Marijuana     Colonoscopy: 2009?  PAP:  07/31/2017/ positive for Atypical Squamous cells of uncertain significance.   Bone density: 10/24/2016 showed a T score of -0.7 (normal was (   Allergies  Allergen Reactions  . Penicillins Nausea Only    Has patient had a PCN reaction causing immediate rash, facial/tongue/throat swelling, SOB or lightheadedness with hypotension: No Has patient had a PCN reaction causing severe rash involving mucus membranes or skin necrosis: No Has patient had a PCN reaction that required hospitalization: No Has patient had a PCN reaction occurring within the last 10 years: Yes--nausea & headache ONLY If Hopkins of the above answers are "NO", then may proceed with Cephalosporin use.     Current Outpatient Medications  Medication Sig Dispense Refill  . acyclovir ointment (ZOVIRAX) 5 % Apply 1 application topically every 4 (four) hours. 15 g 2  . Artificial Tear Solution (OPTI-TEARS OP) Place 1 drop into both eyes 3 (three) times daily as needed (for dry/irritated eyes.).    Marland Kitchen cholestyramine (QUESTRAN) 4 GM/DOSE powder Take 1 packet (4 g total) by mouth 2 (two) times daily with a meal. 378 g 2  . dexamethasone (  DECADRON) 4 MG tablet Take 2 tablets (8 mg total) by mouth 2 (two) times daily. Start the day before Taxotere. Take once the day after, then 2 times a day x 2d. 30 tablet 1  . fluconazole (DIFLUCAN) 100 MG tablet TAKE DAILY FOR 5 DAYS STARTING ON CHEMO DAY 30 tablet 0  . fluticasone (FLONASE) 50 MCG/ACT nasal spray Place 1 spray into both nostrils daily as needed for allergies.    Marland Kitchen HYDROcodone-acetaminophen (NORCO) 5-325 MG tablet 1/2 to 1 tablets Q 4 hours prn pain 30 tablet 0  . hydrocortisone (ANUSOL-HC) 25 MG suppository Place 1 suppository (25 mg total) rectally 2 (two) times daily. 12 suppository 0  . lidocaine-prilocaine (EMLA) cream Apply to affected area once 30 g 3  . loperamide (IMODIUM) 2 MG capsule Take 1 capsule (2 mg total) by mouth as needed for diarrhea or loose stools. 30 capsule 0  .  LORazepam (ATIVAN) 0.5 MG tablet Take 1 tablet (0.5 mg total) by mouth at bedtime as needed (Nausea or vomiting). 30 tablet 1  . Misc Natural Products (OSTEO BI-FLEX ADV TRIPLE ST PO) Take 1 tablet by mouth daily after lunch.    . Multiple Vitamin (MULTIVITAMIN WITH MINERALS) TABS tablet Take 1 tablet by mouth daily after lunch.    . naproxen sodium (ALEVE) 220 MG tablet Take 220 mg by mouth 2 (two) times daily as needed (FOR PAIN.).     Marland Kitchen nitrofurantoin, macrocrystal-monohydrate, (MACROBID) 100 MG capsule Take 1 capsule (100 mg total) by mouth 2 (two) times daily. 20 capsule 0  . Nutritional Supplements (IMMUNE ENHANCE) TABS Take 1 tablet by mouth daily after lunch.    . prochlorperazine (COMPAZINE) 10 MG tablet Take 1 tablet (10 mg total) by mouth every 6 (six) hours as needed (Nausea or vomiting). 30 tablet 1  . tetrahydrozoline (VISINE) 0.05 % ophthalmic solution Place 1 drop into both eyes 3 (three) times daily as needed (for dry eyes.).    Marland Kitchen valACYclovir (VALTREX) 500 MG tablet Take 1 tablet (500 mg total) by mouth 2 (two) times daily. 60 tablet 2   No current facility-administered medications for this visit.     OBJECTIVE:  Vitals:   12/05/17 0853  BP: 106/68  Pulse: 63  Resp: 18  Temp: 98.3 F (36.8 C)  SpO2: 100%     Body mass index is 29.16 kg/m.   Wt Readings from Last 3 Encounters:  12/05/17 194 lb 9.6 oz (88.3 kg)  11/30/17 192 lb 9 oz (87.3 kg)  11/29/17 193 lb (87.5 kg)  ECOG FS:1 - Symptomatic but completely ambulatory GENERAL: Patient is a well appearing female in no acute distress HEENT:  Sclerae anicteric.  Oropharynx clear and moist. No ulcerations or evidence of oropharyngeal candidiasis. Neck is supple.  NODES:  No cervical, supraclavicular, or axillary lymphadenopathy palpated.  BREAST EXAM:  Examined last week, deferred today LUNGS:  Clear to auscultation bilaterally.  No wheezes or rhonchi. HEART:  Regular rate and rhythm. No murmur appreciated. ABDOMEN:   Soft, nontender.  Positive, normoactive bowel sounds. No organomegaly palpated. MSK:  No focal spinal tenderness to palpation. Full range of motion bilaterally in the upper extremities. EXTREMITIES:  No peripheral edema.   SKIN:  Clear with no obvious rashes or skin changes. No nail dyscrasia. NEURO:  Nonfocal. Well oriented.  Appropriate affect.    LAB RESULTS:  CMP     Component Value Date/Time   NA 140 11/30/2017 0927   K 3.7 11/30/2017 0927   CL  107 11/30/2017 0927   CO2 24 11/30/2017 0927   GLUCOSE 154 (H) 11/30/2017 0927   BUN 17 11/30/2017 0927   CREATININE 0.79 11/30/2017 0927   CREATININE 0.81 10/25/2017 1055   CALCIUM 10.0 11/30/2017 0927   PROT 7.2 11/30/2017 0927   ALBUMIN 3.7 11/30/2017 0927   AST 13 (L) 11/30/2017 0927   AST 23 10/25/2017 1055   ALT 24 11/30/2017 0927   ALT 30 10/25/2017 1055   ALKPHOS 130 (H) 11/30/2017 0927   BILITOT 0.4 11/30/2017 0927   BILITOT 0.6 10/25/2017 1055   GFRNONAA >60 11/30/2017 0927   GFRNONAA >60 10/25/2017 1055   GFRAA >60 11/30/2017 0927   GFRAA >60 10/25/2017 1055    No results found for: TOTALPROTELP, ALBUMINELP, A1GS, A2GS, BETS, BETA2SER, GAMS, MSPIKE, SPEI  No results found for: KPAFRELGTCHN, LAMBDASER, KAPLAMBRATIO  Lab Results  Component Value Date   WBC 18.0 (H) 12/05/2017   NEUTROABS 13.5 (H) 12/05/2017   HGB 9.7 (L) 12/05/2017   HCT 31.1 (L) 12/05/2017   MCV 92.0 12/05/2017   PLT 215 12/05/2017    _0 @  No results found for: LABCA2  No components found for: VQMGQQ761  No results for input(s): INR in the last 168 hours.  No results found for: LABCA2  No results found for: PJK932  No results found for: IZT245  No results found for: YKD983  No results found for: CA2729  No components found for: HGQUANT  No results found for: CEA1 / No results found for: CEA1   No results found for: AFPTUMOR  No results found for: Humphrey  No results found for: PSA1  Appointment on  12/05/2017  Component Date Value Ref Range Status  . WBC 12/05/2017 18.0* 3.9 - 10.3 K/uL Final  . RBC 12/05/2017 3.38* 3.70 - 5.45 MIL/uL Final  . Hemoglobin 12/05/2017 9.7* 11.6 - 15.9 g/dL Final  . HCT 12/05/2017 31.1* 34.8 - 46.6 % Final  . MCV 12/05/2017 92.0  79.5 - 101.0 fL Final  . MCH 12/05/2017 28.7  25.1 - 34.0 pg Final  . MCHC 12/05/2017 31.2* 31.5 - 36.0 g/dL Final  . RDW 12/05/2017 18.1* 11.2 - 14.5 % Final  . Platelets 12/05/2017 215  145 - 400 K/uL Final  . nRBC 12/05/2017 0.0  0.0 - 0.2 % Final  . Neutrophils Relative % 12/05/2017 75  % Final  . Neutro Abs 12/05/2017 13.5* 1.5 - 6.5 K/uL Final  . Lymphocytes Relative 12/05/2017 24  % Final  . Lymphs Abs 12/05/2017 4.3* 0.9 - 3.3 K/uL Final  . Monocytes Relative 12/05/2017 1  % Final  . Monocytes Absolute 12/05/2017 0.1  0.1 - 0.9 K/uL Final  . Eosinophils Relative 12/05/2017 0  % Final  . Eosinophils Absolute 12/05/2017 0.0  0.0 - 0.5 K/uL Final  . Basophils Relative 12/05/2017 0  % Final  . Basophils Absolute 12/05/2017 0.0  0.0 - 0.1 K/uL Final   Performed at [facility], [place] [address], [place], [place] [address]    (this displays the last labs from the last 3 days)  No results found for: TOTALPROTELP, ALBUMINELP, A1GS, A2GS, BETS, BETA2SER, GAMS, MSPIKE, SPEI (this displays SPEP labs)  No results found for: KPAFRELGTCHN, LAMBDASER, KAPLAMBRATIO (kappa/lambda light chains)  No results found for: HGBA, HGBA2QUANT, HGBFQUANT, HGBSQUAN (Hemoglobinopathy evaluation)   No results found for: LDH  No results found for: IRON, TIBC, IRONPCTSAT (Iron and TIBC)  No results found for: FERRITIN  Urinalysis    Component Value Date/Time  BILIRUBINUR negative 10/07/2017 0939   BILIRUBINUR n 08/25/2016 1407   KETONESUR negative 10/07/2017 0939   PROTEINUR =30 (A) 10/07/2017 0939   PROTEINUR n 08/25/2016 1407   UROBILINOGEN 1.0 10/07/2017 0939   NITRITE Positive (A) 10/07/2017 0939    NITRITE n 08/25/2016 1407   LEUKOCYTESUR Negative 10/07/2017 0939     STUDIES: No results found.  ELIGIBLE FOR AVAILABLE RESEARCH PROTOCOL: yes (see Research Studies)  ASSESSMENT: 62 y.o. [place], [place] woman status post left breast upper outer quadrant and left axillary lymph node biopsy 08/07/2017, both positive for a T2 N1, stage IIB invasive ductal carcinoma, grade 3, estrogen receptor weakly positive, progesterone receptor negative, but HER-2 amplified, with an MIB-1 of 80%  (a) staging chest CT and bone scan 08/30/2017 showed no evidence of metastatic disease  (1) neoadjuvant chemotherapy will consist of carboplatin, docetaxel, trastuzumab, and Pertuzumab given every 21 days x 6 starting 09/07/2017, perjeta omitted with cycle 2 and 3 due to diarrhea  (a) Gemcitabine substituted for Docetaxel beginning with cycle 5 for concerns of neuropathy  (2) trastuzumab will be continued to complete 6 months  (a) echocardiogram 08/28/2017 showed an ejection fraction in the 60-65% range.  (b) echocardiogram on 11/29/2017 showed an EF of 55-60%  (3) definitive surgery to follow  (4) adjuvant radiation to follow surgery  (5) will start antiestrogens at the completion of local therapy  PLAN:  Yurianna is doing well today. Her labs are stable today and I reviewed them with her in detail.  She tolerated her fifth cycle well thus far and the IV fluids she receives after, help how she feels.  She will receive IV fluids today.    I looked at Kyren's schedule, and I noted that she has not yet had her MRI breast scheduled.  I have discussed this with Bary Castilla RN, who will follow up on this to ensure scheduling.    Regene is wanting to know who to talk to about her surgical options, being mastectomy versus lumpectomy.  I reviewed that we will have to see what the MRI results show [place] on 11/1, and then Dr. Dalbert Batman will discuss surgical plans with her in detail.  She understands this.    [place] will return  in 2 weeks for labs, f/u and her sixth cycle of neoadjuvant chemotherapy.  She knows to call for any questions or concerns prior to her next appointment with [place].    A total of (30) minutes of face-to-face time was spent with this patient with greater than 50% of that time in counseling and care-coordination.   Wilber Bihari, NP  12/05/17 9:23 AM Medical Oncology and Hematology [facility] [address] [place], [place] [address] Tel. [phone]    Fax. [phone]

## 2017-12-05 NOTE — Patient Instructions (Addendum)
Dehydration, Adult Dehydration is when there is not enough fluid or water in your body. This happens when you lose more fluids than you take in. Dehydration can range from mild to very bad. It should be treated right away to keep it from getting very bad. Symptoms of mild dehydration may include:  Thirst.  Dry lips.  Slightly dry mouth.  Dry, warm skin.  Dizziness. Symptoms of moderate dehydration may include:  Very dry mouth.  Muscle cramps.  Dark pee (urine). Pee may be the color of tea.  Your body making less pee.  Your eyes making fewer tears.  Heartbeat that is uneven or faster than normal (palpitations).  Headache.  Light-headedness, especially when you stand up from sitting.  Fainting (syncope). Symptoms of very bad dehydration may include:  Changes in skin, such as: ? Cold and clammy skin. ? Blotchy (mottled) or pale skin. ? Skin that does not quickly return to normal after being lightly pinched and let go (poor skin turgor).  Changes in body fluids, such as: ? Feeling very thirsty. ? Your eyes making fewer tears. ? Not sweating when body temperature is high, such as in hot weather. ? Your body making very little pee.  Changes in vital signs, such as: ? Weak pulse. ? Pulse that is more than 100 beats a minute when you are sitting still. ? Fast breathing. ? Low blood pressure.  Other changes, such as: ? Sunken eyes. ? Cold hands and feet. ? Confusion. ? Lack of energy (lethargy). ? Trouble waking up from sleep. ? Short-term weight loss. ? Unconsciousness. Follow these instructions at home:  If told by your doctor, drink an ORS: ? Make an ORS by using instructions on the package. ? Start by drinking small amounts, about  cup (120 mL) every 5-10 minutes. ? Slowly drink more until you have had the amount that your doctor said to have.  Drink enough clear fluid to keep your pee clear or pale yellow. If you were told to drink an ORS, finish the ORS  first, then start slowly drinking clear fluids. Drink fluids such as: ? Water. Do not drink only water by itself. Doing that can make the salt (sodium) level in your body get too low (hyponatremia). ? Ice chips. ? Fruit juice that you have added water to (diluted). ? Low-calorie sports drinks.  Avoid: ? Alcohol. ? Drinks that have a lot of sugar. These include high-calorie sports drinks, fruit juice that does not have water added, and soda. ? Caffeine. ? Foods that are greasy or have a lot of fat or sugar.  Take over-the-counter and prescription medicines only as told by your doctor.  Do not take salt tablets. Doing that can make the salt level in your body get too high (hypernatremia).  Eat foods that have minerals (electrolytes). Examples include bananas, oranges, potatoes, tomatoes, and spinach.  Keep all follow-up visits as told by your doctor. This is important. Contact a doctor if:  You have belly (abdominal) pain that: ? Gets worse. ? Stays in one area (localizes).  You have a rash.  You have a stiff neck.  You get angry or annoyed more easily than normal (irritability).  You are more sleepy than normal.  You have a harder time waking up than normal.  You feel: ? Weak. ? Dizzy. ? Very thirsty.  You have peed (urinated) only a small amount of very dark pee during 6-8 hours. Get help right away if:  You have symptoms of   very bad dehydration.  You cannot drink fluids without throwing up (vomiting).  Your symptoms get worse with treatment.  You have a fever.  You have a very bad headache.  You are throwing up or having watery poop (diarrhea) and it: ? Gets worse. ? Does not go away.  You have blood or something green (bile) in your throw-up.  You have blood in your poop (stool). This may cause poop to look black and tarry.  You have not peed in 6-8 hours.  You pass out (faint).  Your heart rate when you are sitting still is more than 100 beats a  minute.  You have trouble breathing. This information is not intended to replace advice given to you by your health care provider. Make sure you discuss any questions you have with your health care provider. Document Released: 12/11/2008 Document Revised: 09/04/2015 Document Reviewed: 04/10/2015 Elsevier Interactive Patient Education  2018 [employer]  Dehydration, Adult Dehydration is when there is not enough fluid or water in your body. This happens when you lose more fluids than you take in. Dehydration can range from mild to very bad. It should be treated right away to keep it from getting very bad. Symptoms of mild dehydration may include:  Thirst.  Dry lips.  Slightly dry mouth.  Dry, warm skin.  Dizziness. Symptoms of moderate dehydration may include:  Very dry mouth.  Muscle cramps.  Dark pee (urine). Pee may be the color of tea.  Your body making less pee.  Your eyes making fewer tears.  Heartbeat that is uneven or faster than normal (palpitations).  Headache.  Light-headedness, especially when you stand up from sitting.  Fainting (syncope). Symptoms of very bad dehydration may include:  Changes in skin, such as: ? Cold and clammy skin. ? Blotchy (mottled) or pale skin. ? Skin that does not quickly return to normal after being lightly pinched and let go (poor skin turgor).  Changes in body fluids, such as: ? Feeling very thirsty. ? Your eyes making fewer tears. ? Not sweating when body temperature is high, such as in hot weather. ? Your body making very little pee.  Changes in vital signs, such as: ? Weak pulse. ? Pulse that is more than 100 beats a minute when you are sitting still. ? Fast breathing. ? Low blood pressure.  Other changes, such as: ? Sunken eyes. ? Cold hands and feet. ? Confusion. ? Lack of energy (lethargy). ? Trouble waking up from sleep. ? Short-term weight loss. ? Unconsciousness. Follow these instructions at  home:  If told by your doctor, drink an ORS: ? Make an ORS by using instructions on the package. ? Start by drinking small amounts, about  cup (120 mL) every 5-10 minutes. ? Slowly drink more until you have had the amount that your doctor said to have.  Drink enough clear fluid to keep your pee clear or pale yellow. If you were told to drink an ORS, finish the ORS first, then start slowly drinking clear fluids. Drink fluids such as: ? Water. Do not drink only water by itself. Doing that can make the salt (sodium) level in your body get too low (hyponatremia). ? Ice chips. ? Fruit juice that you have added water to (diluted). ? Low-calorie sports drinks.  Avoid: ? Alcohol. ? Drinks that have a lot of sugar. These include high-calorie sports drinks, fruit juice that does not have water added, and soda. ? Caffeine. ? Foods that are greasy or have   a lot of fat or sugar.  Take over-the-counter and prescription medicines only as told by your doctor.  Do not take salt tablets. Doing that can make the salt level in your body get too high (hypernatremia).  Eat foods that have minerals (electrolytes). Examples include bananas, oranges, potatoes, tomatoes, and spinach.  Keep all follow-up visits as told by your doctor. This is important. Contact a doctor if:  You have belly (abdominal) pain that: ? Gets worse. ? Stays in one area (localizes).  You have a rash.  You have a stiff neck.  You get angry or annoyed more easily than normal (irritability).  You are more sleepy than normal.  You have a harder time waking up than normal.  You feel: ? Weak. ? Dizzy. ? Very thirsty.  You have peed (urinated) only a small amount of very dark pee during 6-8 hours. Get help right away if:  You have symptoms of very bad dehydration.  You cannot drink fluids without throwing up (vomiting).  Your symptoms get worse with treatment.  You have a fever.  You have a very bad headache.  You  are throwing up or having watery poop (diarrhea) and it: ? Gets worse. ? Does not go away.  You have blood or something green (bile) in your throw-up.  You have blood in your poop (stool). This may cause poop to look black and tarry.  You have not peed in 6-8 hours.  You pass out (faint).  Your heart rate when you are sitting still is more than 100 beats a minute.  You have trouble breathing. This information is not intended to replace advice given to you by your health care provider. Make sure you discuss any questions you have with your health care provider. Document Released: 12/11/2008 Document Revised: 09/04/2015 Document Reviewed: 04/10/2015 Elsevier Interactive Patient Education  2018 [employer]  

## 2017-12-19 ENCOUNTER — Other Ambulatory Visit: Payer: Self-pay | Admitting: Oncology

## 2017-12-20 ENCOUNTER — Telehealth: Payer: Self-pay | Admitting: *Deleted

## 2017-12-20 NOTE — Telephone Encounter (Signed)
Scheduled an confirmed breast MRI for 11/1 at 7:30am.

## 2017-12-21 ENCOUNTER — Inpatient Hospital Stay: Payer: 59

## 2017-12-21 ENCOUNTER — Telehealth: Payer: Self-pay | Admitting: Adult Health

## 2017-12-21 ENCOUNTER — Inpatient Hospital Stay: Payer: 59 | Admitting: Adult Health

## 2017-12-21 ENCOUNTER — Encounter: Payer: Self-pay | Admitting: *Deleted

## 2017-12-21 ENCOUNTER — Encounter: Payer: Self-pay | Admitting: Adult Health

## 2017-12-21 ENCOUNTER — Other Ambulatory Visit: Payer: Self-pay | Admitting: Oncology

## 2017-12-21 VITALS — BP 141/78 | HR 74 | Temp 98.3°F | Resp 18 | Ht 68.5 in | Wt 197.1 lb

## 2017-12-21 DIAGNOSIS — Z170 Estrogen receptor positive status [ER+]: Secondary | ICD-10-CM

## 2017-12-21 DIAGNOSIS — C50412 Malignant neoplasm of upper-outer quadrant of left female breast: Secondary | ICD-10-CM

## 2017-12-21 DIAGNOSIS — C773 Secondary and unspecified malignant neoplasm of axilla and upper limb lymph nodes: Secondary | ICD-10-CM

## 2017-12-21 DIAGNOSIS — Z95828 Presence of other vascular implants and grafts: Secondary | ICD-10-CM

## 2017-12-21 DIAGNOSIS — Z5111 Encounter for antineoplastic chemotherapy: Secondary | ICD-10-CM | POA: Diagnosis not present

## 2017-12-21 LAB — COMPREHENSIVE METABOLIC PANEL
ALT: 23 U/L (ref 0–44)
AST: 17 U/L (ref 15–41)
Albumin: 3.7 g/dL (ref 3.5–5.0)
Alkaline Phosphatase: 113 U/L (ref 38–126)
Anion gap: 13 (ref 5–15)
BUN: 7 mg/dL — ABNORMAL LOW (ref 8–23)
CO2: 22 mmol/L (ref 22–32)
Calcium: 9.5 mg/dL (ref 8.9–10.3)
Chloride: 109 mmol/L (ref 98–111)
Creatinine, Ser: 0.79 mg/dL (ref 0.44–1.00)
GFR calc Af Amer: >60 mL/min (ref >60)
GFR calc non Af Amer: >60 mL/min (ref >60)
Glucose, Bld: 133 mg/dL — ABNORMAL HIGH (ref 70–99)
Potassium: 3.4 mmol/L — ABNORMAL LOW (ref 3.5–5.1)
Sodium: 144 mmol/L (ref 135–145)
Total Bilirubin: 0.4 mg/dL (ref 0.3–1.2)
Total Protein: 6.7 g/dL (ref 6.5–8.1)

## 2017-12-21 LAB — CBC WITH DIFFERENTIAL/PLATELET
Abs Immature Granulocytes: 0.13 10*3/uL — ABNORMAL HIGH (ref 0.00–0.07)
Basophils Absolute: 0 10*3/uL (ref 0.0–0.1)
Basophils Relative: 0 %
Eosinophils Absolute: 0.1 10*3/uL (ref 0.0–0.5)
Eosinophils Relative: 2 %
HCT: 29.5 % — ABNORMAL LOW (ref 36.0–46.0)
Hemoglobin: 9.2 g/dL — ABNORMAL LOW (ref 12.0–15.0)
Immature Granulocytes: 2 %
Lymphocytes Relative: 35 %
Lymphs Abs: 2.7 10*3/uL (ref 0.7–4.0)
MCH: 29.7 pg (ref 26.0–34.0)
MCHC: 31.2 g/dL (ref 30.0–36.0)
MCV: 95.2 fL (ref 80.0–100.0)
Monocytes Absolute: 0.7 10*3/uL (ref 0.1–1.0)
Monocytes Relative: 9 %
Neutro Abs: 3.9 10*3/uL (ref 1.7–7.7)
Neutrophils Relative %: 52 %
Platelets: 343 10*3/uL (ref 150–400)
RBC: 3.1 MIL/uL — ABNORMAL LOW (ref 3.87–5.11)
RDW: 17.9 % — ABNORMAL HIGH (ref 11.5–15.5)
WBC: 7.5 10*3/uL (ref 4.0–10.5)
nRBC: 0 % (ref 0.0–0.2)

## 2017-12-21 MED ORDER — DIPHENHYDRAMINE HCL 25 MG PO CAPS
ORAL_CAPSULE | ORAL | Status: AC
  Filled 2017-12-21: qty 1

## 2017-12-21 MED ORDER — SODIUM CHLORIDE 0.9 % IV SOLN
Freq: Once | INTRAVENOUS | Status: AC
  Administered 2017-12-21: 11:00:00 via INTRAVENOUS
  Filled 2017-12-21: qty 5

## 2017-12-21 MED ORDER — TRASTUZUMAB CHEMO 150 MG IV SOLR
6.0000 mg/kg | Freq: Once | INTRAVENOUS | Status: AC
  Administered 2017-12-21: 546 mg via INTRAVENOUS
  Filled 2017-12-21: qty 26

## 2017-12-21 MED ORDER — HEPARIN SOD (PORK) LOCK FLUSH 100 UNIT/ML IV SOLN
500.0000 [IU] | Freq: Once | INTRAVENOUS | Status: AC | PRN
  Administered 2017-12-21: 500 [IU]
  Filled 2017-12-21: qty 5

## 2017-12-21 MED ORDER — SODIUM CHLORIDE 0.9% FLUSH
10.0000 mL | INTRAVENOUS | Status: DC | PRN
  Administered 2017-12-21: 10 mL
  Filled 2017-12-21: qty 10

## 2017-12-21 MED ORDER — PALONOSETRON HCL INJECTION 0.25 MG/5ML
0.2500 mg | Freq: Once | INTRAVENOUS | Status: AC
  Administered 2017-12-21: 0.25 mg via INTRAVENOUS

## 2017-12-21 MED ORDER — PALONOSETRON HCL INJECTION 0.25 MG/5ML
INTRAVENOUS | Status: AC
  Filled 2017-12-21: qty 5

## 2017-12-21 MED ORDER — PEGFILGRASTIM 6 MG/0.6ML ~~LOC~~ PSKT
PREFILLED_SYRINGE | SUBCUTANEOUS | Status: AC
  Filled 2017-12-21: qty 0.6

## 2017-12-21 MED ORDER — SODIUM CHLORIDE 0.9 % IV SOLN
Freq: Once | INTRAVENOUS | Status: AC
  Administered 2017-12-21: 10:00:00 via INTRAVENOUS
  Filled 2017-12-21: qty 250

## 2017-12-21 MED ORDER — PEGFILGRASTIM 6 MG/0.6ML ~~LOC~~ PSKT
6.0000 mg | PREFILLED_SYRINGE | Freq: Once | SUBCUTANEOUS | Status: AC
  Administered 2017-12-21: 6 mg via SUBCUTANEOUS

## 2017-12-21 MED ORDER — SODIUM CHLORIDE 0.9% FLUSH
10.0000 mL | Freq: Once | INTRAVENOUS | Status: AC
  Administered 2017-12-21: 10 mL
  Filled 2017-12-21: qty 10

## 2017-12-21 MED ORDER — ACETAMINOPHEN 325 MG PO TABS
ORAL_TABLET | ORAL | Status: AC
  Filled 2017-12-21: qty 2

## 2017-12-21 MED ORDER — SODIUM CHLORIDE 0.9 % IV SOLN
420.0000 mg | Freq: Once | INTRAVENOUS | Status: AC
  Administered 2017-12-21: 420 mg via INTRAVENOUS
  Filled 2017-12-21: qty 14

## 2017-12-21 MED ORDER — DIPHENHYDRAMINE HCL 25 MG PO CAPS
25.0000 mg | ORAL_CAPSULE | Freq: Once | ORAL | Status: AC
  Administered 2017-12-21: 25 mg via ORAL

## 2017-12-21 MED ORDER — ACETAMINOPHEN 325 MG PO TABS
650.0000 mg | ORAL_TABLET | Freq: Once | ORAL | Status: AC
  Administered 2017-12-21: 650 mg via ORAL

## 2017-12-21 MED ORDER — SODIUM CHLORIDE 0.9 % IV SOLN
645.5000 mg | Freq: Once | INTRAVENOUS | Status: AC
  Administered 2017-12-21: 650 mg via INTRAVENOUS
  Filled 2017-12-21: qty 65

## 2017-12-21 MED ORDER — SODIUM CHLORIDE 0.9 % IV SOLN
800.0000 mg/m2 | Freq: Once | INTRAVENOUS | Status: AC
  Administered 2017-12-21: 1672 mg via INTRAVENOUS
  Filled 2017-12-21: qty 43.97

## 2017-12-21 NOTE — Patient Instructions (Signed)
[facility] Discharge Instructions for Patients Receiving Chemotherapy  Today you received the following chemotherapy agents Trastuzumab (Herceptin), Pertuzumab (Perjeta), Gemcitabine (Gemzar), Carboplatin (Paraplatin) & Pegfilgrastim (Neulasta On Pro).  To help prevent nausea and vomiting after your treatment, we encourage you to take your nausea medication as prescribed.   If you develop nausea and vomiting that is not controlled by your nausea medication, call the clinic.   BELOW ARE SYMPTOMS THAT SHOULD BE REPORTED IMMEDIATELY:  *FEVER GREATER THAN 100.5 F  *CHILLS WITH OR WITHOUT FEVER  NAUSEA AND VOMITING THAT IS NOT CONTROLLED WITH YOUR NAUSEA MEDICATION  *UNUSUAL SHORTNESS OF BREATH  *UNUSUAL BRUISING OR BLEEDING  TENDERNESS IN MOUTH AND THROAT WITH OR WITHOUT PRESENCE OF ULCERS  *URINARY PROBLEMS  *BOWEL PROBLEMS  UNUSUAL RASH Items with * indicate a potential emergency and should be followed up as soon as possible.  Feel free to call the clinic should you have any questions or concerns. The clinic phone number is [phone].  Please show the [facility] at check-in to the Emergency Department and triage nurse.

## 2017-12-21 NOTE — Telephone Encounter (Signed)
Gave avs and calendar ° °

## 2017-12-21 NOTE — Progress Notes (Addendum)
[place]  [phone] [phone]     ID: Madison Hopkins DOB: [birth year]  MR#: [id number]  IWP#:[id number]  Patient Care Team: Donzetta Kohut as PCP - General (Physician Assistant) [name], [name] Dad, MD as Consulting Physician (Oncology) Kyung Rudd, MD as Consulting Physician (Radiation Oncology) [name], [name] All, MD as Consulting Physician (Obstetrics and Gynecology) Gentry Fitz, MD as Consulting Physician (Family Medicine) [name], MD as Consulting Physician (General Surgery) OTHER MD:  CHIEF COMPLAINT: HER-2 positive breast cancer  CURRENT TREATMENT: Neoadjuvant chemotherapy  INTERVAL HISTORY: Madison Hopkins returns today for follow-up of her HER-2 positive, weakly estrogen receptor positive breast cancer accompanied by her friend Madison Hopkins. She is receiving neoadjuvant chemotherapy with carboplatin, docetaxel, trastuzumab, and pertuzumab. Today is day 1 cycle 6.    REVIEW OF SYSTEMS: [name] is doing well today.   A detailed ROS was conducted and is non contributory.    HISTORY OF CURRENT ILLNESS: From the original intake note:  ALEEN Hopkins noted a mass in the left axilla sometime in January or February 2019.  She eventually brought her to her gynecologist's attention, and underwent bilateral diagnostic mammography with tomography and left breast ultrasonography at [facility] on 08/04/2017 showing: breast density category B. There is a highly suspicious hypoechoic mass in the left breast at the 2 o'clock upper outer quadrant measuring 2.3 x 1.6 x 2.2 cm, located 2 cm from the nipple. Sonographically, there were 2 enlarged lymph nodes in the left axilla, the largest with cortical thickening measuring 2.5 cm. No evidence of malignancy was seen in the right breast.   Accordingly on 08/07/2017 she proceeded to biopsy of the left breast area and 1 of the lymph nodes in question. The pathology from this procedure  showed ([id number]): Invasive ductal carcinoma, grade 3. Metastatic carcinoma was found in one left axillary lymph node. Prognostic indicators significant for: estrogen receptor, 30% positive with weak staining intensity and progesterone receptor, 0% negative. Proliferation marker Ki67 at 80%. HER2 amplified with ratios HER2/CEP17 signals 2.32 and average HER2 copies per cell 6.60  The patient's subsequent history is as detailed below.   PAST MEDICAL HISTORY: Past Medical History:  Diagnosis Date  . Abnormal Pap smear of vagina 07/28/2016   LGSIL; colpo 07/2016 atrophic squamous cells; colpo 07/2017 - atypia  . Allergy   . Breast cancer ([place])    Metastatic Left breast  . Elevated hemoglobin A1c 07/28/2016   level - 6.1  . Endometriosis   . Low vitamin D level 07/28/2016   level 24.7  . STD (sexually transmitted disease)   GERD but no ulcers  PAST SURGICAL HISTORY: Past Surgical History:  Procedure Laterality Date  . ABDOMINAL HYSTERECTOMY    . CESAREAN SECTION    . COLON SURGERY    . OOPHORECTOMY    . PORTACATH PLACEMENT N/A 09/06/2017   Procedure: INSERTION PORT-A-CATH;  Surgeon: [name], MD;  Location: [facility];  Service: General;  Laterality: N/A;  . SMALL INTESTINE SURGERY    . SPINE SURGERY    Hysterectomy with salpingo-oophorectomy, Tonsillectomy, Plantar Fascitis Right Foot Surgery    FAMILY HISTORY Family History  Problem Relation Age of Onset  . Mental illness Mother   . Alcoholism Mother   . Cirrhosis Mother   . Cancer Father   . Lung cancer Father   The patient' father died at age 60 due to lung cancer (heavy smoker). The patient's mother died due to liver cirrhosis (heavy drinker).  The patient has 2 brothers and no sisters. There was a paternal 1st cousin with colon cancer diagnosed in the mid 40's, who also had cervical cancer. The mother of this 1st cousin (the patient's paternal aunt) had cancer (the patient's is unsure of what type). There was also a  paternal uncle with prostate cancer diagnosed in the 81's. The patient denies a family history of breast or ovarian cancer.     GYNECOLOGIC HISTORY:  No LMP recorded (lmp unknown). Patient has had a hysterectomy. Menarche: 62 years old Age at first live birth: 62 years old She is GXP2.  The patient is status post total hysterectomy with bilateral salpingo-oophorectomy in 1995.  She never used contraception. She notes that she had an estrogen shot one time but no other HRTs.    SOCIAL HISTORY:  The patient works in Therapist, art for the tax department. She is single. At home is herself and no pets. Her son, Madison Hopkins is age 27 and lives in [place], [place] as a Musician. The patient's daughter Madison Hopkins is age 72 and lives in [place] in Therapist, art for [employer]. The patient has 5 grandchildren and 1 great grandchild. She attends [employer]     ADVANCED DIRECTIVES: Not in place; at the 08/16/2017 visit the patient was given the appropriate forms to complete on notarized at her discretion   HEALTH MAINTENANCE: Social History   Tobacco Use  . Smoking status: Never Smoker  . Smokeless tobacco: Never Used  Substance Use Topics  . Alcohol use: Yes    Alcohol/week: 0.0 standard drinks    Comment: occasional  . Drug use: Yes    Types: Marijuana     Colonoscopy: 2009?  PAP: 07/31/2017/ positive for Atypical Squamous cells of uncertain significance.   Bone density: 10/24/2016 showed a T score of -0.7 (normal was (   Allergies  Allergen Reactions  . Penicillins Nausea Only    Has patient had a PCN reaction causing immediate rash, facial/tongue/throat swelling, SOB or lightheadedness with hypotension: No Has patient had a PCN reaction causing severe rash involving mucus membranes or skin necrosis: No Has patient had a PCN reaction that required hospitalization: No Has patient had a PCN reaction occurring within the last 10 years: Yes--nausea & headache ONLY If all of the  above answers are "NO", then may proceed with Cephalosporin use.     Current Outpatient Medications  Medication Sig Dispense Refill  . acyclovir ointment (ZOVIRAX) 5 % Apply 1 application topically every 4 (four) hours. 15 g 2  . Artificial Tear Solution (OPTI-TEARS OP) Place 1 drop into both eyes 3 (three) times daily as needed (for dry/irritated eyes.).    Marland Kitchen cholestyramine (QUESTRAN) 4 GM/DOSE powder Take 1 packet (4 g total) by mouth 2 (two) times daily with a meal. 378 g 2  . dexamethasone (DECADRON) 4 MG tablet Take 2 tablets (8 mg total) by mouth 2 (two) times daily. Start the day before Taxotere. Take once the day after, then 2 times a day x 2d. 30 tablet 1  . fluconazole (DIFLUCAN) 100 MG tablet TAKE 1 TABLET EVERY DAY FOR 5 DAYS -START AFTER CHEMO- 30 tablet 0  . fluticasone (FLONASE) 50 MCG/ACT nasal spray Place 1 spray into both nostrils daily as needed for allergies.    Marland Kitchen HYDROcodone-acetaminophen (NORCO) 5-325 MG tablet 1/2 to 1 tablets Q 4 hours prn pain 30 tablet 0  . hydrocortisone (ANUSOL-HC) 25 MG suppository Place 1 suppository (25 mg total) rectally  2 (two) times daily. 12 suppository 0  . lidocaine-prilocaine (EMLA) cream Apply to affected area once 30 g 3  . loperamide (IMODIUM) 2 MG capsule Take 1 capsule (2 mg total) by mouth as needed for diarrhea or loose stools. 30 capsule 0  . LORazepam (ATIVAN) 0.5 MG tablet Take 1 tablet (0.5 mg total) by mouth at bedtime as needed (Nausea or vomiting). 30 tablet 1  . Misc Natural Products (OSTEO BI-FLEX ADV TRIPLE ST PO) Take 1 tablet by mouth daily after lunch.    . Multiple Vitamin (MULTIVITAMIN WITH MINERALS) TABS tablet Take 1 tablet by mouth daily after lunch.    . naproxen sodium (ALEVE) 220 MG tablet Take 220 mg by mouth 2 (two) times daily as needed (FOR PAIN.).     Marland Kitchen nitrofurantoin, macrocrystal-monohydrate, (MACROBID) 100 MG capsule Take 1 capsule (100 mg total) by mouth 2 (two) times daily. 20 capsule 0  . Nutritional  Supplements (IMMUNE ENHANCE) TABS Take 1 tablet by mouth daily after lunch.    . prochlorperazine (COMPAZINE) 10 MG tablet Take 1 tablet (10 mg total) by mouth every 6 (six) hours as needed (Nausea or vomiting). 30 tablet 1  . tetrahydrozoline (VISINE) 0.05 % ophthalmic solution Place 1 drop into both eyes 3 (three) times daily as needed (for dry eyes.).    Marland Kitchen valACYclovir (VALTREX) 500 MG tablet Take 1 tablet (500 mg total) by mouth 2 (two) times daily. 60 tablet 2   No current facility-administered medications for this visit.     OBJECTIVE:  Vitals:   12/21/17 0910  BP: (!) 141/78  Pulse: 74  Resp: 18  Temp: 98.3 F (36.8 C)  SpO2: 100%     Body mass index is 29.53 kg/m.   Wt Readings from Last 3 Encounters:  12/21/17 197 lb 1.6 oz (89.4 kg)  12/05/17 194 lb 9.6 oz (88.3 kg)  11/30/17 192 lb 9 oz (87.3 kg)  ECOG FS:1 - Symptomatic but completely ambulatory GENERAL: Patient is a well appearing female in no acute distress HEENT:  Sclerae anicteric.  Oropharynx clear and moist. No ulcerations or evidence of oropharyngeal candidiasis. Neck is supple.  NODES:  No cervical, supraclavicular, or axillary lymphadenopathy palpated.  BREAST EXAM:  Unable to palpate mass LUNGS:  Clear to auscultation bilaterally.  No wheezes or rhonchi. HEART:  Regular rate and rhythm. No murmur appreciated. ABDOMEN:  Soft, nontender.  Positive, normoactive bowel sounds. No organomegaly palpated. MSK:  No focal spinal tenderness to palpation. Full range of motion bilaterally in the upper extremities. EXTREMITIES:  No peripheral edema.   SKIN:  Clear with no obvious rashes or skin changes. No nail dyscrasia. NEURO:  Nonfocal. Well oriented.  Appropriate affect.    LAB RESULTS:  CMP     Component Value Date/Time   NA 144 12/21/2017 0812   K 3.4 (L) 12/21/2017 0812   CL 109 12/21/2017 0812   CO2 22 12/21/2017 0812   GLUCOSE 133 (H) 12/21/2017 0812   BUN 7 (L) 12/21/2017 0812   CREATININE 0.79  12/21/2017 0812   CREATININE 0.81 10/25/2017 1055   CALCIUM 9.5 12/21/2017 0812   PROT 6.7 12/21/2017 0812   ALBUMIN 3.7 12/21/2017 0812   AST 17 12/21/2017 0812   AST 23 10/25/2017 1055   ALT 23 12/21/2017 0812   ALT 30 10/25/2017 1055   ALKPHOS 113 12/21/2017 0812   BILITOT 0.4 12/21/2017 0812   BILITOT 0.6 10/25/2017 1055   GFRNONAA >60 12/21/2017 0812   GFRNONAA >60 10/25/2017  1055   GFRAA >60 12/21/2017 0812   GFRAA >60 10/25/2017 1055    No results found for: TOTALPROTELP, ALBUMINELP, A1GS, A2GS, BETS, BETA2SER, GAMS, MSPIKE, SPEI  No results found for: KPAFRELGTCHN, LAMBDASER, KAPLAMBRATIO  Lab Results  Component Value Date   WBC 7.5 12/21/2017   NEUTROABS 3.9 12/21/2017   HGB 9.2 (L) 12/21/2017   HCT 29.5 (L) 12/21/2017   MCV 95.2 12/21/2017   PLT 343 12/21/2017    '@LASTCHEMISTRY'$ @  No results found for: LABCA2  No components found for: [id number]  No results for input(s): INR in the last 168 hours.  No results found for: LABCA2  No results found for: WUJ811  No results found for: BJY782  No results found for: NFA213  No results found for: CA2729  No components found for: HGQUANT  No results found for: CEA1 / No results found for: CEA1   No results found for: AFPTUMOR  No results found for: [place]  No results found for: PSA1  Appointment on 12/21/2017  Component Date Value Ref Range Status  . Sodium 12/21/2017 144  135 - 145 mmol/L Final  . Potassium 12/21/2017 3.4* 3.5 - 5.1 mmol/L Final  . Chloride 12/21/2017 109  98 - 111 mmol/L Final  . CO2 12/21/2017 22  22 - 32 mmol/L Final  . Glucose, Bld 12/21/2017 133* 70 - 99 mg/dL Final  . BUN 12/21/2017 7* 8 - 23 mg/dL Final  . Creatinine, Ser 12/21/2017 0.79  0.44 - 1.00 mg/dL Final  . Calcium 12/21/2017 9.5  8.9 - 10.3 mg/dL Final  . Total Protein 12/21/2017 6.7  6.5 - 8.1 g/dL Final  . Albumin 12/21/2017 3.7  3.5 - 5.0 g/dL Final  . AST 12/21/2017 17  15 - 41 U/L Final  . ALT 12/21/2017  23  0 - 44 U/L Final  . Alkaline Phosphatase 12/21/2017 113  38 - 126 U/L Final  . Total Bilirubin 12/21/2017 0.4  0.3 - 1.2 mg/dL Final  . GFR calc non Af Amer 12/21/2017 >60  >60 mL/min Final  . GFR calc Af Amer 12/21/2017 >60  >60 mL/min Final   Comment: (NOTE) The eGFR has been calculated using the CKD EPI equation. This calculation has not been validated in all clinical situations. eGFR's persistently <60 mL/min signify possible Chronic Kidney Disease.   Georgiann Hahn gap 12/21/2017 13  5 - 15 Final   Performed at [facility], [place] [address], [place], [place] [address]  . WBC 12/21/2017 7.5  4.0 - 10.5 K/uL Final  . RBC 12/21/2017 3.10* 3.87 - 5.11 MIL/uL Final  . Hemoglobin 12/21/2017 9.2* 12.0 - 15.0 g/dL Final  . HCT 12/21/2017 29.5* 36.0 - 46.0 % Final  . MCV 12/21/2017 95.2  80.0 - 100.0 fL Final  . MCH 12/21/2017 29.7  26.0 - 34.0 pg Final  . MCHC 12/21/2017 31.2  30.0 - 36.0 g/dL Final  . RDW 12/21/2017 17.9* 11.5 - 15.5 % Final  . Platelets 12/21/2017 343  150 - 400 K/uL Final  . nRBC 12/21/2017 0.0  0.0 - 0.2 % Final  . Neutrophils Relative % 12/21/2017 52  % Final  . Neutro Abs 12/21/2017 3.9  1.7 - 7.7 K/uL Final  . Lymphocytes Relative 12/21/2017 35  % Final  . Lymphs Abs 12/21/2017 2.7  0.7 - 4.0 K/uL Final  . Monocytes Relative 12/21/2017 9  % Final  . Monocytes Absolute 12/21/2017 0.7  0.1 - 1.0 K/uL Final  . Eosinophils Relative 12/21/2017 2  %  Final  . Eosinophils Absolute 12/21/2017 0.1  0.0 - 0.5 K/uL Final  . Basophils Relative 12/21/2017 0  % Final  . Basophils Absolute 12/21/2017 0.0  0.0 - 0.1 K/uL Final  . Immature Granulocytes 12/21/2017 2  % Final  . Abs Immature Granulocytes 12/21/2017 0.13* 0.00 - 0.07 K/uL Final   Performed at [facility], [place] [address] [place], [place] [address]    (this displays the last labs from the last 3 days)  No results found for: TOTALPROTELP, ALBUMINELP, A1GS, A2GS,  BETS, BETA2SER, GAMS, MSPIKE, SPEI (this displays SPEP labs)  No results found for: KPAFRELGTCHN, LAMBDASER, KAPLAMBRATIO (kappa/lambda light chains)  No results found for: HGBA, HGBA2QUANT, HGBFQUANT, HGBSQUAN (Hemoglobinopathy evaluation)   No results found for: LDH  No results found for: IRON, TIBC, IRONPCTSAT (Iron and TIBC)  No results found for: FERRITIN  Urinalysis    Component Value Date/Time   BILIRUBINUR negative 10/07/2017 0939   BILIRUBINUR n 08/25/2016 1407   KETONESUR negative 10/07/2017 0939   PROTEINUR =30 (A) 10/07/2017 0939   PROTEINUR n 08/25/2016 1407   UROBILINOGEN 1.0 10/07/2017 0939   NITRITE Positive (A) 10/07/2017 0939   NITRITE n 08/25/2016 1407   LEUKOCYTESUR Negative 10/07/2017 0939     STUDIES: No results found.  ELIGIBLE FOR AVAILABLE RESEARCH PROTOCOL: yes (see Research Studies)  ASSESSMENT: 62 y.o. [place], [place] woman status post left breast upper outer quadrant and left axillary lymph node biopsy 08/07/2017, both positive for a T2 N1, stage IIB invasive ductal carcinoma, grade 3, estrogen receptor weakly positive, progesterone receptor negative, but HER-2 amplified, with an MIB-1 of 80%  (a) staging chest CT and bone scan 08/30/2017 showed no evidence of metastatic disease  (1) neoadjuvant chemotherapy will consist of carboplatin, docetaxel, trastuzumab, and Pertuzumab given every 21 days x 6 starting 09/07/2017, perjeta omitted with cycle 2 and 3 due to diarrhea  (a) Gemcitabine substituted for Docetaxel beginning with cycle 5 for concerns of neuropathy  (2) trastuzumab will be continued to complete 6 months  (a) echocardiogram 08/28/2017 showed an ejection fraction in the 60-65% range.  (b) echocardiogram on 11/29/2017 showed an EF of 55-60%  (3) definitive surgery to follow  (4) adjuvant radiation to follow surgery  (5) will start antiestrogens at the completion of local therapy  PLAN:  Madison Hopkins is doing well today.  Her labs are  stable and she is happy that today completes the chemotherapy portion of her treatment.  She will proceed with her sixth and final cycle of Gemcitabine and Carboplatin today and will also receive the Trastuzumab/Pertuzumab.    Chevonne will return on Saturday and Tuesday for IV fluids of NS 1L.    Teandra and I reviewed her MRI and it is scheduled on 12/29/2017 (this date specifically at her request).  She notes she has an appointment with her surgeon, Dr. Dalbert Batman on 01/09/18.  Cynitha will return on 01/03/2018, hopefully we will be able to present her at breast conference that day as well.  She knows to call for any questions or concerns prior to her next appointment with [place].    A total of (30) minutes of face-to-face time was spent with this patient with greater than 50% of that time in counseling and care-coordination.   Wilber Bihari, NP  12/21/17 9:30 AM Medical Oncology and Hematology [facility] [address] [place], [place] [address] Tel. [phone]    Fax. [phone]

## 2017-12-23 ENCOUNTER — Inpatient Hospital Stay: Payer: 59

## 2017-12-23 VITALS — BP 131/69 | HR 92 | Temp 98.1°F | Resp 18

## 2017-12-23 DIAGNOSIS — Z95828 Presence of other vascular implants and grafts: Secondary | ICD-10-CM

## 2017-12-23 DIAGNOSIS — C50412 Malignant neoplasm of upper-outer quadrant of left female breast: Secondary | ICD-10-CM

## 2017-12-23 DIAGNOSIS — Z170 Estrogen receptor positive status [ER+]: Principal | ICD-10-CM

## 2017-12-23 DIAGNOSIS — Z5111 Encounter for antineoplastic chemotherapy: Secondary | ICD-10-CM | POA: Diagnosis not present

## 2017-12-23 MED ORDER — SODIUM CHLORIDE 0.9% FLUSH
10.0000 mL | Freq: Once | INTRAVENOUS | Status: AC
  Administered 2017-12-23: 10 mL
  Filled 2017-12-23: qty 10

## 2017-12-23 MED ORDER — SODIUM CHLORIDE 0.9 % IV SOLN
INTRAVENOUS | Status: DC
  Administered 2017-12-23: 08:00:00 via INTRAVENOUS
  Filled 2017-12-23 (×2): qty 250

## 2017-12-23 MED ORDER — HEPARIN SOD (PORK) LOCK FLUSH 100 UNIT/ML IV SOLN
500.0000 [IU] | Freq: Once | INTRAVENOUS | Status: AC
  Administered 2017-12-23: 500 [IU]
  Filled 2017-12-23: qty 5

## 2017-12-23 NOTE — Patient Instructions (Addendum)
Dehydration, Adult Dehydration is when there is not enough fluid or water in your body. This happens when you lose more fluids than you take in. Dehydration can range from mild to very bad. It should be treated right away to keep it from getting very bad. Symptoms of mild dehydration may include:  Thirst.  Dry lips.  Slightly dry mouth.  Dry, warm skin.  Dizziness. Symptoms of moderate dehydration may include:  Very dry mouth.  Muscle cramps.  Dark pee (urine). Pee may be the color of tea.  Your body making less pee.  Your eyes making fewer tears.  Heartbeat that is uneven or faster than normal (palpitations).  Headache.  Light-headedness, especially when you stand up from sitting.  Fainting (syncope). Symptoms of very bad dehydration may include:  Changes in skin, such as: ? Cold and clammy skin. ? Blotchy (mottled) or pale skin. ? Skin that does not quickly return to normal after being lightly pinched and let go (poor skin turgor).  Changes in body fluids, such as: ? Feeling very thirsty. ? Your eyes making fewer tears. ? Not sweating when body temperature is high, such as in hot weather. ? Your body making very little pee.  Changes in vital signs, such as: ? Weak pulse. ? Pulse that is more than 100 beats a minute when you are sitting still. ? Fast breathing. ? Low blood pressure.  Other changes, such as: ? Sunken eyes. ? Cold hands and feet. ? Confusion. ? Lack of energy (lethargy). ? Trouble waking up from sleep. ? Short-term weight loss. ? Unconsciousness. Follow these instructions at home:  If told by your doctor, drink an ORS: ? Make an ORS by using instructions on the package. ? Start by drinking small amounts, about  cup (120 mL) every 5-10 minutes. ? Slowly drink more until you have had the amount that your doctor said to have.  Drink enough clear fluid to keep your pee clear or pale yellow. If you were told to drink an ORS, finish the ORS  first, then start slowly drinking clear fluids. Drink fluids such as: ? Water. Do not drink only water by itself. Doing that can make the salt (sodium) level in your body get too low (hyponatremia). ? Ice chips. ? Fruit juice that you have added water to (diluted). ? Low-calorie sports drinks.  Avoid: ? Alcohol. ? Drinks that have a lot of sugar. These include high-calorie sports drinks, fruit juice that does not have water added, and soda. ? Caffeine. ? Foods that are greasy or have a lot of fat or sugar.  Take over-the-counter and prescription medicines only as told by your doctor.  Do not take salt tablets. Doing that can make the salt level in your body get too high (hypernatremia).  Eat foods that have minerals (electrolytes). Examples include bananas, oranges, potatoes, tomatoes, and spinach.  Keep all follow-up visits as told by your doctor. This is important. Contact a doctor if:  You have belly (abdominal) pain that: ? Gets worse. ? Stays in one area (localizes).  You have a rash.  You have a stiff neck.  You get angry or annoyed more easily than normal (irritability).  You are more sleepy than normal.  You have a harder time waking up than normal.  You feel: ? Weak. ? Dizzy. ? Very thirsty.  You have peed (urinated) only a small amount of very dark pee during 6-8 hours. Get help right away if:  You have symptoms of   very bad dehydration.  You cannot drink fluids without throwing up (vomiting).  Your symptoms get worse with treatment.  You have a fever.  You have a very bad headache.  You are throwing up or having watery poop (diarrhea) and it: ? Gets worse. ? Does not go away.  You have blood or something green (bile) in your throw-up.  You have blood in your poop (stool). This may cause poop to look black and tarry.  You have not peed in 6-8 hours.  You pass out (faint).  Your heart rate when you are sitting still is more than 100 beats a  minute.  You have trouble breathing. This information is not intended to replace advice given to you by your health care provider. Make sure you discuss any questions you have with your health care provider. Document Released: 12/11/2008 Document Revised: 09/04/2015 Document Reviewed: 04/10/2015 [employer] Interactive Patient Education  2018 [employer]  Dehydration, Adult Dehydration is a condition in which there is not enough fluid or water in the body. This happens when you lose more fluids than you take in. Important organs, such as the kidneys, brain, and heart, cannot function without a proper amount of fluids. Any loss of fluids from the body can lead to dehydration. Dehydration can range from mild to severe. This condition should be treated right away to prevent it from becoming severe. What are the causes? This condition may be caused by:  Vomiting.  Diarrhea.  Excessive sweating, such as from heat exposure or exercise.  Not drinking enough fluid, especially: ? When ill. ? While doing activity that requires a lot of energy.  Excessive urination.  Fever.  Infection.  Certain medicines, such as medicines that cause the body to lose excess fluid (diuretics).  Inability to access safe drinking water.  Reduced physical ability to get adequate water and food.  What increases the risk? This condition is more likely to develop in people:  Who have a poorly controlled long-term (chronic) illness, such as diabetes, heart disease, or kidney disease.  Who are age 65 or older.  Who are disabled.  Who live in a place with high altitude.  Who play endurance sports.  What are the signs or symptoms? Symptoms of mild dehydration may include:  Thirst.  Dry lips.  Slightly dry mouth.  Dry, warm skin.  Dizziness. Symptoms of moderate dehydration may include:  Very dry mouth.  Muscle cramps.  Dark urine. Urine may be the color of tea.  Decreased urine  production.  Decreased tear production.  Heartbeat that is irregular or faster than normal (palpitations).  Headache.  Light-headedness, especially when you stand up from a sitting position.  Fainting (syncope). Symptoms of severe dehydration may include:  Changes in skin, such as: ? Cold and clammy skin. ? Blotchy (mottled) or pale skin. ? Skin that does not quickly return to normal after being lightly pinched and released (poor skin turgor).  Changes in body fluids, such as: ? Extreme thirst. ? No tear production. ? Inability to sweat when body temperature is high, such as in hot weather. ? Very little urine production.  Changes in vital signs, such as: ? Weak pulse. ? Pulse that is more than 100 beats a minute when sitting still. ? Rapid breathing. ? Low blood pressure.  Other changes, such as: ? Sunken eyes. ? Cold hands and feet. ? Confusion. ? Lack of energy (lethargy). ? Difficulty waking up from sleep. ? Short-term weight loss. ? Unconsciousness. How is   this diagnosed? This condition is diagnosed based on your symptoms and a physical exam. Blood and urine tests may be done to help confirm the diagnosis. How is this treated? Treatment for this condition depends on the severity. Mild or moderate dehydration can often be treated at home. Treatment should be started right away. Do not wait until dehydration becomes severe. Severe dehydration is an emergency and it needs to be treated in a hospital. Treatment for mild dehydration may include:  Drinking more fluids.  Replacing salts and minerals in your blood (electrolytes) that you may have lost. Treatment for moderate dehydration may include:  Drinking an oral rehydration solution (ORS). This is a drink that helps you replace fluids and electrolytes (rehydrate). It can be found at pharmacies and retail stores. Treatment for severe dehydration may include:  Receiving fluids through an IV tube.  Receiving an  electrolyte solution through a feeding tube that is passed through your nose and into your stomach (nasogastric tube, or NG tube).  Correcting any abnormalities in electrolytes.  Treating the underlying cause of dehydration. Follow these instructions at home:  If directed by your health care provider, drink an ORS: ? Make an ORS by following instructions on the package. ? Start by drinking small amounts, about  cup (120 mL) every 5-10 minutes. ? Slowly increase how much you drink until you have taken the amount recommended by your health care provider.  Drink enough clear fluid to keep your urine clear or pale yellow. If you were told to drink an ORS, finish the ORS first, then start slowly drinking other clear fluids. Drink fluids such as: ? Water. Do not drink only water. Doing that can lead to having too little salt (sodium) in the body (hyponatremia). ? Ice chips. ? Fruit juice that you have added water to (diluted fruit juice). ? Low-calorie sports drinks.  Avoid: ? Alcohol. ? Drinks that contain a lot of sugar. These include high-calorie sports drinks, fruit juice that is not diluted, and soda. ? Caffeine. ? Foods that are greasy or contain a lot of fat or sugar.  Take over-the-counter and prescription medicines only as told by your health care provider.  Do not take sodium tablets. This can lead to having too much sodium in the body (hypernatremia).  Eat foods that contain a healthy balance of electrolytes, such as bananas, oranges, potatoes, tomatoes, and spinach.  Keep all follow-up visits as told by your health care provider. This is important. Contact a health care provider if:  You have abdominal pain that: ? Gets worse. ? Stays in one area (localizes).  You have a rash.  You have a stiff neck.  You are more irritable than usual.  You are sleepier or more difficult to wake up than usual.  You feel weak or dizzy.  You feel very thirsty.  You have urinated  only a small amount of very dark urine over 6-8 hours. Get help right away if:  You have symptoms of severe dehydration.  You cannot drink fluids without vomiting.  Your symptoms get worse with treatment.  You have a fever.  You have a severe headache.  You have vomiting or diarrhea that: ? Gets worse. ? Does not go away.  You have blood or green matter (bile) in your vomit.  You have blood in your stool. This may cause stool to look black and tarry.  You have not urinated in 6-8 hours.  You faint.  Your heart rate while sitting still is   over 100 beats a minute.  You have trouble breathing. This information is not intended to replace advice given to you by your health care provider. Make sure you discuss any questions you have with your health care provider. Document Released: 02/14/2005 Document Revised: 09/11/2015 Document Reviewed: 04/10/2015 [employer] Interactive Patient Education  2018 [employer]  

## 2017-12-26 ENCOUNTER — Inpatient Hospital Stay: Payer: 59

## 2017-12-26 DIAGNOSIS — Z95828 Presence of other vascular implants and grafts: Secondary | ICD-10-CM

## 2017-12-26 DIAGNOSIS — Z5111 Encounter for antineoplastic chemotherapy: Secondary | ICD-10-CM | POA: Diagnosis not present

## 2017-12-26 DIAGNOSIS — C50412 Malignant neoplasm of upper-outer quadrant of left female breast: Secondary | ICD-10-CM

## 2017-12-26 DIAGNOSIS — Z170 Estrogen receptor positive status [ER+]: Principal | ICD-10-CM

## 2017-12-26 MED ORDER — HEPARIN SOD (PORK) LOCK FLUSH 100 UNIT/ML IV SOLN
500.0000 [IU] | Freq: Once | INTRAVENOUS | Status: AC
  Administered 2017-12-26: 500 [IU]
  Filled 2017-12-26: qty 5

## 2017-12-26 MED ORDER — SODIUM CHLORIDE 0.9 % IV SOLN
INTRAVENOUS | Status: DC
  Administered 2017-12-26: 15:00:00 via INTRAVENOUS
  Filled 2017-12-26 (×2): qty 250

## 2017-12-26 MED ORDER — SODIUM CHLORIDE 0.9% FLUSH
10.0000 mL | Freq: Once | INTRAVENOUS | Status: AC
  Administered 2017-12-26: 10 mL
  Filled 2017-12-26: qty 10

## 2017-12-26 NOTE — Patient Instructions (Signed)
Dehydration, Adult Dehydration is when there is not enough fluid or water in your body. This happens when you lose more fluids than you take in. Dehydration can range from mild to very bad. It should be treated right away to keep it from getting very bad. Symptoms of mild dehydration may include:  Thirst.  Dry lips.  Slightly dry mouth.  Dry, warm skin.  Dizziness. Symptoms of moderate dehydration may include:  Very dry mouth.  Muscle cramps.  Dark pee (urine). Pee may be the color of tea.  Your body making less pee.  Your eyes making fewer tears.  Heartbeat that is uneven or faster than normal (palpitations).  Headache.  Light-headedness, especially when you stand up from sitting.  Fainting (syncope). Symptoms of very bad dehydration may include:  Changes in skin, such as: ? Cold and clammy skin. ? Blotchy (mottled) or pale skin. ? Skin that does not quickly return to normal after being lightly pinched and let go (poor skin turgor).  Changes in body fluids, such as: ? Feeling very thirsty. ? Your eyes making fewer tears. ? Not sweating when body temperature is high, such as in hot weather. ? Your body making very little pee.  Changes in vital signs, such as: ? Weak pulse. ? Pulse that is more than 100 beats a minute when you are sitting still. ? Fast breathing. ? Low blood pressure.  Other changes, such as: ? Sunken eyes. ? Cold hands and feet. ? Confusion. ? Lack of energy (lethargy). ? Trouble waking up from sleep. ? Short-term weight loss. ? Unconsciousness. Follow these instructions at home:  If told by your doctor, drink an ORS: ? Make an ORS by using instructions on the package. ? Start by drinking small amounts, about  cup (120 mL) every 5-10 minutes. ? Slowly drink more until you have had the amount that your doctor said to have.  Drink enough clear fluid to keep your pee clear or pale yellow. If you were told to drink an ORS, finish the ORS  first, then start slowly drinking clear fluids. Drink fluids such as: ? Water. Do not drink only water by itself. Doing that can make the salt (sodium) level in your body get too low (hyponatremia). ? Ice chips. ? Fruit juice that you have added water to (diluted). ? Low-calorie sports drinks.  Avoid: ? Alcohol. ? Drinks that have a lot of sugar. These include high-calorie sports drinks, fruit juice that does not have water added, and soda. ? Caffeine. ? Foods that are greasy or have a lot of fat or sugar.  Take over-the-counter and prescription medicines only as told by your doctor.  Do not take salt tablets. Doing that can make the salt level in your body get too high (hypernatremia).  Eat foods that have minerals (electrolytes). Examples include bananas, oranges, potatoes, tomatoes, and spinach.  Keep all follow-up visits as told by your doctor. This is important. Contact a doctor if:  You have belly (abdominal) pain that: ? Gets worse. ? Stays in one area (localizes).  You have a rash.  You have a stiff neck.  You get angry or annoyed more easily than normal (irritability).  You are more sleepy than normal.  You have a harder time waking up than normal.  You feel: ? Weak. ? Dizzy. ? Very thirsty.  You have peed (urinated) only a small amount of very dark pee during 6-8 hours. Get help right away if:  You have symptoms of   very bad dehydration.  You cannot drink fluids without throwing up (vomiting).  Your symptoms get worse with treatment.  You have a fever.  You have a very bad headache.  You are throwing up or having watery poop (diarrhea) and it: ? Gets worse. ? Does not go away.  You have blood or something green (bile) in your throw-up.  You have blood in your poop (stool). This may cause poop to look black and tarry.  You have not peed in 6-8 hours.  You pass out (faint).  Your heart rate when you are sitting still is more than 100 beats a  minute.  You have trouble breathing. This information is not intended to replace advice given to you by your health care provider. Make sure you discuss any questions you have with your health care provider. Document Released: 12/11/2008 Document Revised: 09/04/2015 Document Reviewed: 04/10/2015 [employer] Interactive Patient Education  2018 [employer]  

## 2017-12-27 ENCOUNTER — Other Ambulatory Visit: Payer: 59

## 2017-12-27 ENCOUNTER — Ambulatory Visit (HOSPITAL_COMMUNITY)
Admission: RE | Admit: 2017-12-27 | Discharge: 2017-12-27 | Disposition: A | Discharge status: Home / Self Care | Payer: 59 | Source: Ambulatory Visit | Attending: Adult Health | Admitting: Adult Health

## 2017-12-27 ENCOUNTER — Ambulatory Visit: Payer: 59 | Admitting: Adult Health

## 2017-12-27 DIAGNOSIS — C50412 Malignant neoplasm of upper-outer quadrant of left female breast: Secondary | ICD-10-CM | POA: Insufficient documentation

## 2017-12-27 DIAGNOSIS — Z170 Estrogen receptor positive status [ER+]: Secondary | ICD-10-CM | POA: Diagnosis present

## 2017-12-27 DIAGNOSIS — C50912 Malignant neoplasm of unspecified site of left female breast: Secondary | ICD-10-CM | POA: Diagnosis not present

## 2017-12-27 DIAGNOSIS — C773 Secondary and unspecified malignant neoplasm of axilla and upper limb lymph nodes: Secondary | ICD-10-CM | POA: Insufficient documentation

## 2017-12-27 MED ORDER — GADOBUTROL 1 MMOL/ML IV SOLN
10.0000 mL | Freq: Once | INTRAVENOUS | Status: AC | PRN
  Administered 2017-12-27: 9 mL via INTRAVENOUS

## 2017-12-29 ENCOUNTER — Ambulatory Visit (HOSPITAL_COMMUNITY): Payer: 59

## 2018-01-03 ENCOUNTER — Inpatient Hospital Stay: Payer: 59 | Attending: Adult Health

## 2018-01-03 ENCOUNTER — Inpatient Hospital Stay: Payer: 59

## 2018-01-03 ENCOUNTER — Inpatient Hospital Stay (HOSPITAL_BASED_OUTPATIENT_CLINIC_OR_DEPARTMENT_OTHER): Payer: 59 | Admitting: Adult Health

## 2018-01-03 ENCOUNTER — Telehealth: Payer: Self-pay | Admitting: Adult Health

## 2018-01-03 ENCOUNTER — Encounter: Payer: Self-pay | Admitting: Adult Health

## 2018-01-03 VITALS — BP 141/94 | HR 71 | Temp 97.8°F | Resp 18 | Ht 68.5 in | Wt 198.6 lb

## 2018-01-03 DIAGNOSIS — C50412 Malignant neoplasm of upper-outer quadrant of left female breast: Secondary | ICD-10-CM | POA: Insufficient documentation

## 2018-01-03 DIAGNOSIS — C773 Secondary and unspecified malignant neoplasm of axilla and upper limb lymph nodes: Secondary | ICD-10-CM | POA: Insufficient documentation

## 2018-01-03 DIAGNOSIS — Z95828 Presence of other vascular implants and grafts: Secondary | ICD-10-CM

## 2018-01-03 DIAGNOSIS — Z5111 Encounter for antineoplastic chemotherapy: Secondary | ICD-10-CM | POA: Diagnosis not present

## 2018-01-03 DIAGNOSIS — Z170 Estrogen receptor positive status [ER+]: Secondary | ICD-10-CM

## 2018-01-03 DIAGNOSIS — Z801 Family history of malignant neoplasm of trachea, bronchus and lung: Secondary | ICD-10-CM | POA: Insufficient documentation

## 2018-01-03 DIAGNOSIS — Z5112 Encounter for antineoplastic immunotherapy: Secondary | ICD-10-CM | POA: Diagnosis present

## 2018-01-03 LAB — CBC WITH DIFFERENTIAL/PLATELET
Abs Immature Granulocytes: 0.09 10*3/uL — ABNORMAL HIGH (ref 0.00–0.07)
Basophils Absolute: 0 10*3/uL (ref 0.0–0.1)
Basophils Relative: 0 %
Eosinophils Absolute: 0.1 10*3/uL (ref 0.0–0.5)
Eosinophils Relative: 1 %
HCT: 27.8 % — ABNORMAL LOW (ref 36.0–46.0)
Hemoglobin: 8.6 g/dL — ABNORMAL LOW (ref 12.0–15.0)
Immature Granulocytes: 1 %
Lymphocytes Relative: 32 %
Lymphs Abs: 3.2 10*3/uL (ref 0.7–4.0)
MCH: 29.9 pg (ref 26.0–34.0)
MCHC: 30.9 g/dL (ref 30.0–36.0)
MCV: 96.5 fL (ref 80.0–100.0)
Monocytes Absolute: 0.9 10*3/uL (ref 0.1–1.0)
Monocytes Relative: 9 %
Neutro Abs: 5.5 10*3/uL (ref 1.7–7.7)
Neutrophils Relative %: 57 %
Platelets: 120 10*3/uL — ABNORMAL LOW (ref 150–400)
RBC: 2.88 MIL/uL — ABNORMAL LOW (ref 3.87–5.11)
RDW: 16.4 % — ABNORMAL HIGH (ref 11.5–15.5)
WBC: 9.8 10*3/uL (ref 4.0–10.5)
nRBC: 0 % (ref 0.0–0.2)

## 2018-01-03 LAB — COMPREHENSIVE METABOLIC PANEL
ALT: 21 U/L (ref 0–44)
AST: 13 U/L — ABNORMAL LOW (ref 15–41)
Albumin: 4 g/dL (ref 3.5–5.0)
Alkaline Phosphatase: 156 U/L — ABNORMAL HIGH (ref 38–126)
Anion gap: 8 (ref 5–15)
BUN: 9 mg/dL (ref 8–23)
CO2: 25 mmol/L (ref 22–32)
Calcium: 9.4 mg/dL (ref 8.9–10.3)
Chloride: 108 mmol/L (ref 98–111)
Creatinine, Ser: 0.78 mg/dL (ref 0.44–1.00)
GFR calc Af Amer: >60 mL/min (ref >60)
GFR calc non Af Amer: >60 mL/min (ref >60)
Glucose, Bld: 88 mg/dL (ref 70–99)
Potassium: 3.6 mmol/L (ref 3.5–5.1)
Sodium: 141 mmol/L (ref 135–145)
Total Bilirubin: <0.2 mg/dL — ABNORMAL LOW (ref 0.3–1.2)
Total Protein: 6.9 g/dL (ref 6.5–8.1)

## 2018-01-03 MED ORDER — SODIUM CHLORIDE 0.9% FLUSH
10.0000 mL | Freq: Once | INTRAVENOUS | Status: AC
  Administered 2018-01-03: 10 mL
  Filled 2018-01-03: qty 10

## 2018-01-03 MED ORDER — HEPARIN SOD (PORK) LOCK FLUSH 100 UNIT/ML IV SOLN
500.0000 [IU] | Freq: Once | INTRAVENOUS | Status: AC
  Administered 2018-01-03: 500 [IU]
  Filled 2018-01-03: qty 5

## 2018-01-03 NOTE — Progress Notes (Signed)
[place]  Telephone:(336) [phone] Fax:(336) [phone]     ID: Madison Hopkins DOB: 21-Nov-1955  MR#: [id number]  MAU#:[id number]  Patient Care Team: Donzetta Kohut as PCP - General (Physician Assistant) [name], [name] Dad, MD as Consulting Physician (Oncology) Kyung Rudd, MD as Consulting Physician (Radiation Oncology) [name], [name] All, MD as Consulting Physician (Obstetrics and Gynecology) Gentry Fitz, MD as Consulting Physician (Family Medicine) [name], MD as Consulting Physician (General Surgery) OTHER MD:  CHIEF COMPLAINT: HER-2 positive breast cancer  CURRENT TREATMENT: Neoadjuvant chemotherapy  INTERVAL HISTORY: Madison Hopkins returns today for follow-up of her HER-2 positive, weakly estrogen receptor positive breast cancer accompanied by her friend Madison Hopkins. She is receiving neoadjuvant chemotherapy with carboplatin, docetaxel, trastuzumab, and pertuzumab. Today is day 8 cycle 6.  She underwent her breast MRI on 12/29/2017.  It showed a complete imaging response to her chemotherapy in her breast, and a significant reduction in the size of her lymph nodes.     REVIEW OF SYSTEMS: Madison Hopkins is feeling well since she completed her chemotherapy.  She received her fluids last week, and denies any issues.  She has an upcoming appointment with Dr. Dalbert Batman.  She is looking forward to meeting with him to discuss her surgery.  She denies any other issues and a detailed ROS is otherwise non contributory.    HISTORY OF CURRENT ILLNESS: From the original intake note:  Madison Hopkins noted a mass in the left axilla sometime in January or February 2019.  She eventually brought her to her gynecologist's attention, and underwent bilateral diagnostic mammography with tomography and left breast ultrasonography at [facility] on 08/04/2017 showing: breast density category B. There is a highly suspicious hypoechoic mass in the left breast at the 2 o'clock  upper outer quadrant measuring 2.3 x 1.6 x 2.2 cm, located 2 cm from the nipple. Sonographically, there were 2 enlarged lymph nodes in the left axilla, the largest with cortical thickening measuring 2.5 cm. No evidence of malignancy was seen in the right breast.   Accordingly on 08/07/2017 she proceeded to biopsy of the left breast area and 1 of the lymph nodes in question. The pathology from this procedure showed ([id number]): Invasive ductal carcinoma, grade 3. Metastatic carcinoma was found in one left axillary lymph node. Prognostic indicators significant for: estrogen receptor, 30% positive with weak staining intensity and progesterone receptor, 0% negative. Proliferation marker Ki67 at 80%. HER2 amplified with ratios HER2/CEP17 signals 2.32 and average HER2 copies per cell 6.60  The patient's subsequent history is as detailed below.   PAST MEDICAL HISTORY: Past Medical History:  Diagnosis Date  . Abnormal Pap smear of vagina 07/28/2016   LGSIL; colpo 07/2016 atrophic squamous cells; colpo 07/2017 - atypia  . Allergy   . Breast cancer ([place])    Metastatic Left breast  . Elevated hemoglobin A1c 07/28/2016   level - 6.1  . Endometriosis   . Low vitamin D level 07/28/2016   level 24.7  . STD (sexually transmitted disease)   GERD but no ulcers  PAST SURGICAL HISTORY: Past Surgical History:  Procedure Laterality Date  . ABDOMINAL HYSTERECTOMY    . CESAREAN SECTION    . COLON SURGERY    . OOPHORECTOMY    . PORTACATH PLACEMENT N/A 09/06/2017   Procedure: INSERTION PORT-A-CATH;  Surgeon: [name], MD;  Location: [facility] [facility];  Service: General;  Laterality: N/A;  . SMALL INTESTINE SURGERY    . SPINE SURGERY  Hysterectomy with salpingo-oophorectomy, Tonsillectomy, Plantar Fascitis Right Foot Surgery    FAMILY HISTORY Family History  Problem Relation Age of Onset  . Mental illness Mother   . Alcoholism Mother   . Cirrhosis Mother   . Cancer Father   . Lung cancer Father   The  patient' father died at age 19 due to lung cancer (heavy smoker). The patient's mother died due to liver cirrhosis (heavy drinker). The patient has 2 brothers and no sisters. There was a paternal 1st cousin with colon cancer diagnosed in the mid 40's, who also had cervical cancer. The mother of this 1st cousin (the patient's paternal aunt) had cancer (the patient's is unsure of what type). There was also a paternal uncle with prostate cancer diagnosed in the 71's. The patient denies a family history of breast or ovarian cancer.     GYNECOLOGIC HISTORY:  No LMP recorded (lmp unknown). Patient has had a hysterectomy. Menarche: 62 years old Age at first live birth: 62 years old She is GXP2.  The patient is status post total hysterectomy with bilateral salpingo-oophorectomy in 1995.  She never used contraception. She notes that she had an estrogen shot one time but no other HRTs.    SOCIAL HISTORY:  The patient works in Therapist, art for the tax department. She is single. At home is herself and no pets. Her son, Madison Hopkins is age 72 and lives in [place], [place] as a Musician. The patient's daughter Madison Hopkins is age 40 and lives in [place] in Therapist, art for [employer]. The patient has 5 grandchildren and 1 great grandchild. She attends [employer].     ADVANCED DIRECTIVES: Not in place; at the 08/16/2017 visit the patient was given the appropriate forms to complete on notarized at her discretion   HEALTH MAINTENANCE: Social History   Tobacco Use  . Smoking status: Never Smoker  . Smokeless tobacco: Never Used  Substance Use Topics  . Alcohol use: Yes    Alcohol/week: 0.0 standard drinks    Comment: occasional  . Drug use: Yes    Types: Marijuana     Colonoscopy: 2009?  PAP: 07/31/2017/ positive for Atypical Squamous cells of uncertain significance.   Bone density: 10/24/2016 showed a T score of -0.7 (normal was (   Allergies  Allergen Reactions  . Penicillins Nausea Only     Has patient had a PCN reaction causing immediate rash, facial/tongue/throat swelling, SOB or lightheadedness with hypotension: No Has patient had a PCN reaction causing severe rash involving mucus membranes or skin necrosis: No Has patient had a PCN reaction that required hospitalization: No Has patient had a PCN reaction occurring within the last 10 years: Yes--nausea & headache ONLY If all of the above answers are "NO", then may proceed with Cephalosporin use.     Current Outpatient Medications  Medication Sig Dispense Refill  . acyclovir ointment (ZOVIRAX) 5 % Apply 1 application topically every 4 (four) hours. 15 g 2  . Artificial Tear Solution (OPTI-TEARS OP) Place 1 drop into both eyes 3 (three) times daily as needed (for dry/irritated eyes.).    Marland Kitchen cholestyramine (QUESTRAN) 4 GM/DOSE powder Take 1 packet (4 g total) by mouth 2 (two) times daily with a meal. 378 g 2  . dexamethasone (DECADRON) 4 MG tablet Take 2 tablets (8 mg total) by mouth 2 (two) times daily. Start the day before Taxotere. Take once the day after, then 2 times a day x 2d. 30 tablet 1  .  fluconazole (DIFLUCAN) 100 MG tablet TAKE 1 TABLET EVERY DAY FOR 5 DAYS -START AFTER CHEMO- 30 tablet 0  . fluticasone (FLONASE) 50 MCG/ACT nasal spray Place 1 spray into both nostrils daily as needed for allergies.    Marland Kitchen HYDROcodone-acetaminophen (NORCO) 5-325 MG tablet 1/2 to 1 tablets Q 4 hours prn pain 30 tablet 0  . hydrocortisone (ANUSOL-HC) 25 MG suppository Place 1 suppository (25 mg total) rectally 2 (two) times daily. 12 suppository 0  . lidocaine-prilocaine (EMLA) cream Apply to affected area once 30 g 3  . loperamide (IMODIUM) 2 MG capsule Take 1 capsule (2 mg total) by mouth as needed for diarrhea or loose stools. 30 capsule 0  . LORazepam (ATIVAN) 0.5 MG tablet Take 1 tablet (0.5 mg total) by mouth at bedtime as needed (Nausea or vomiting). 30 tablet 1  . Misc Natural Products (OSTEO BI-FLEX ADV TRIPLE ST PO) Take 1  tablet by mouth daily after lunch.    . Multiple Vitamin (MULTIVITAMIN WITH MINERALS) TABS tablet Take 1 tablet by mouth daily after lunch.    . naproxen sodium (ALEVE) 220 MG tablet Take 220 mg by mouth 2 (two) times daily as needed (FOR PAIN.).     Marland Kitchen nitrofurantoin, macrocrystal-monohydrate, (MACROBID) 100 MG capsule Take 1 capsule (100 mg total) by mouth 2 (two) times daily. 20 capsule 0  . Nutritional Supplements (IMMUNE ENHANCE) TABS Take 1 tablet by mouth daily after lunch.    . prochlorperazine (COMPAZINE) 10 MG tablet Take 1 tablet (10 mg total) by mouth every 6 (six) hours as needed (Nausea or vomiting). 30 tablet 1  . tetrahydrozoline (VISINE) 0.05 % ophthalmic solution Place 1 drop into both eyes 3 (three) times daily as needed (for dry eyes.).    Marland Kitchen valACYclovir (VALTREX) 500 MG tablet Take 1 tablet (500 mg total) by mouth 2 (two) times daily. 60 tablet 2  . amoxicillin (AMOXIL) 500 MG tablet      No current facility-administered medications for this visit.     OBJECTIVE:  Vitals:   01/03/18 1021  BP: (!) 141/94  Pulse: 71  Resp: 18  Temp: 97.8 F (36.6 C)  SpO2: 100%     Body mass index is 29.76 kg/m.   Wt Readings from Last 3 Encounters:  01/03/18 198 lb 9.6 oz (90.1 kg)  12/21/17 197 lb 1.6 oz (89.4 kg)  12/05/17 194 lb 9.6 oz (88.3 kg)  ECOG FS:1 - Symptomatic but completely ambulatory GENERAL: Patient is a well appearing female in no acute distress HEENT:  Sclerae anicteric.  Oropharynx clear and moist. No ulcerations or evidence of oropharyngeal candidiasis. Neck is supple.  NODES:  No cervical, supraclavicular, or axillary lymphadenopathy palpated.  BREAST EXAM:  Unable to palpate mass LUNGS:  Clear to auscultation bilaterally.  No wheezes or rhonchi. HEART:  Regular rate and rhythm. No murmur appreciated. ABDOMEN:  Soft, nontender.  Positive, normoactive bowel sounds. No organomegaly palpated. MSK:  No focal spinal tenderness to palpation. Full range of motion  bilaterally in the upper extremities. EXTREMITIES:  No peripheral edema.   SKIN:  Clear with no obvious rashes or skin changes. No nail dyscrasia. NEURO:  Nonfocal. Well oriented.  Appropriate affect.    LAB RESULTS:  CMP     Component Value Date/Time   NA 144 12/21/2017 0812   K 3.4 (L) 12/21/2017 0812   CL 109 12/21/2017 0812   CO2 22 12/21/2017 0812   GLUCOSE 133 (H) 12/21/2017 0812   BUN 7 (L) 12/21/2017 2423  CREATININE 0.79 12/21/2017 0812   CREATININE 0.81 10/25/2017 1055   CALCIUM 9.5 12/21/2017 0812   PROT 6.7 12/21/2017 0812   ALBUMIN 3.7 12/21/2017 0812   AST 17 12/21/2017 0812   AST 23 10/25/2017 1055   ALT 23 12/21/2017 0812   ALT 30 10/25/2017 1055   ALKPHOS 113 12/21/2017 0812   BILITOT 0.4 12/21/2017 0812   BILITOT 0.6 10/25/2017 1055   GFRNONAA >60 12/21/2017 0812   GFRNONAA >60 10/25/2017 1055   GFRAA >60 12/21/2017 0812   GFRAA >60 10/25/2017 1055    No results found for: TOTALPROTELP, ALBUMINELP, A1GS, A2GS, BETS, BETA2SER, GAMS, MSPIKE, SPEI  No results found for: KPAFRELGTCHN, LAMBDASER, KAPLAMBRATIO  Lab Results  Component Value Date   WBC 9.8 01/03/2018   NEUTROABS 5.5 01/03/2018   HGB 8.6 (L) 01/03/2018   HCT 27.8 (L) 01/03/2018   MCV 96.5 01/03/2018   PLT 120 (L) 01/03/2018    '@LASTCHEMISTRY'$ @  No results found for: LABCA2  No components found for: MPNTIR443  No results for input(s): INR in the last 168 hours.  No results found for: LABCA2  No results found for: XVQ008  No results found for: QPY195  No results found for: KDT267  No results found for: CA2729  No components found for: HGQUANT  No results found for: CEA1 / No results found for: CEA1   No results found for: AFPTUMOR  No results found for: CHROMOGRNA  No results found for: PSA1  Appointment on 01/03/2018  Component Date Value Ref Range Status  . WBC 01/03/2018 9.8  4.0 - 10.5 K/uL Final  . RBC 01/03/2018 2.88* 3.87 - 5.11 MIL/uL Final  . Hemoglobin  01/03/2018 8.6* 12.0 - 15.0 g/dL Final  . HCT 01/03/2018 27.8* 36.0 - 46.0 % Final  . MCV 01/03/2018 96.5  80.0 - 100.0 fL Final  . MCH 01/03/2018 29.9  26.0 - 34.0 pg Final  . MCHC 01/03/2018 30.9  30.0 - 36.0 g/dL Final  . RDW 01/03/2018 16.4* 11.5 - 15.5 % Final  . Platelets 01/03/2018 120* 150 - 400 K/uL Final  . nRBC 01/03/2018 0.0  0.0 - 0.2 % Final  . Neutrophils Relative % 01/03/2018 57  % Final  . Neutro Abs 01/03/2018 5.5  1.7 - 7.7 K/uL Final  . Lymphocytes Relative 01/03/2018 32  % Final  . Lymphs Abs 01/03/2018 3.2  0.7 - 4.0 K/uL Final  . Monocytes Relative 01/03/2018 9  % Final  . Monocytes Absolute 01/03/2018 0.9  0.1 - 1.0 K/uL Final  . Eosinophils Relative 01/03/2018 1  % Final  . Eosinophils Absolute 01/03/2018 0.1  0.0 - 0.5 K/uL Final  . Basophils Relative 01/03/2018 0  % Final  . Basophils Absolute 01/03/2018 0.0  0.0 - 0.1 K/uL Final  . Immature Granulocytes 01/03/2018 1  % Final  . Abs Immature Granulocytes 01/03/2018 0.09* 0.00 - 0.07 K/uL Final   Performed at [facility], [place] [address], [place], [place] [address]    (this displays the last labs from the last 3 days)  No results found for: TOTALPROTELP, ALBUMINELP, A1GS, A2GS, BETS, BETA2SER, GAMS, MSPIKE, SPEI (this displays SPEP labs)  No results found for: KPAFRELGTCHN, LAMBDASER, KAPLAMBRATIO (kappa/lambda light chains)  No results found for: HGBA, HGBA2QUANT, HGBFQUANT, HGBSQUAN (Hemoglobinopathy evaluation)   No results found for: LDH  No results found for: IRON, TIBC, IRONPCTSAT (Iron and TIBC)  No results found for: FERRITIN  Urinalysis    Component Value Date/Time   BILIRUBINUR  negative 10/07/2017 0939   BILIRUBINUR n 08/25/2016 1407   KETONESUR negative 10/07/2017 0939   PROTEINUR =30 (A) 10/07/2017 0939   PROTEINUR n 08/25/2016 1407   UROBILINOGEN 1.0 10/07/2017 0939   NITRITE Positive (A) 10/07/2017 0939   NITRITE n 08/25/2016 1407   LEUKOCYTESUR  Negative 10/07/2017 0939     STUDIES: Mr Breast Bilateral W Wo Contrast Inc Cad  Result Date: 12/28/2017 CLINICAL DATA:  Follow-up grade 3 invasive ductal carcinoma in the 2 o'clock position of the left breast and left axillary metastatic adenopathy, treated with neoadjuvant chemotherapy. LABS:  None obtained on site today. EXAM: BILATERAL BREAST MRI WITH AND WITHOUT CONTRAST TECHNIQUE: Multiplanar, multisequence MR images of both breasts were obtained prior to and following the intravenous administration of 9 ml of Gadavist Three-dimensional MR images were rendered by post-processing of the original MR data on an independent workstation. The three-dimensional MR images were interpreted, and findings are reported in the following complete MRI report for this study. Three dimensional images were evaluated at the independent DynaCad workstation COMPARISON:  08/23/2017. FINDINGS: Breast composition: b. Scattered fibroglandular tissue. Background parenchymal enhancement: Minimal. Right breast: No mass or abnormal enhancement. The previously demonstrated 10 mm intraparenchymal lymph node in the posterior aspect of the lateral right breast currently measures 8 mm in maximum diameter. A small cyst in the anterior right breast is unchanged. A chemotherapy port is noted in the medial right breast. Left breast: The previously demonstrated 2.5 cm biopsy-proven malignancy in the upper-outer left breast is no longer demonstrated. The previously demonstrated 5 cm area of linear enhancement at the medial aspect of the mass is also no longer seen. No mass or abnormal enhancement is currently seen anywhere in the left breast. Lymph nodes: The previously demonstrated 3 abnormally enlarged left axillary lymph nodes are significantly smaller. The previously demonstrated 6 cm more anteriorly located node currently has the appearance of 2 adjacent nodes measuring 2.5 cm in corresponding diameter. A previously demonstrated 6 cm  more posteriorly located node currently measures 2.0 cm in corresponding diameter. A previously demonstrated 2.0 cm node currently measures 1.0 cm in corresponding diameter. No new enlarged or enlarging lymph nodes are seen. Ancillary findings:  None. IMPRESSION: 1. The biopsy-proven malignancy in the left breast is no longer demonstrated. 2. Significantly improved metastatic left axillary adenopathy. 3. No evidence of malignancy on the right. RECOMMENDATION: Treatment plan. BI-RADS CATEGORY  6: Known biopsy-proven malignancy. Electronically Signed   By: Claudie Revering M.D.   On: 12/28/2017 11:56    ELIGIBLE FOR AVAILABLE RESEARCH PROTOCOL: yes (see Research Studies)  ASSESSMENT: 62 y.o. [place], [place] woman status post left breast upper outer quadrant and left axillary lymph node biopsy 08/07/2017, both positive for a T2 N1, stage IIB invasive ductal carcinoma, grade 3, estrogen receptor weakly positive, progesterone receptor negative, but HER-2 amplified, with an MIB-1 of 80%  (a) staging chest CT and bone scan 08/30/2017 showed no evidence of metastatic disease  (1) neoadjuvant chemotherapy will consist of carboplatin, docetaxel, trastuzumab, and Pertuzumab given every 21 days x 6 starting 09/07/2017, perjeta omitted with cycle 2 and 3 due to diarrhea  (a) Gemcitabine substituted for Docetaxel beginning with cycle 5 and 6 for concerns of neuropathy  (2) trastuzumab will be continued to complete 6 months  (a) echocardiogram 08/28/2017 showed an ejection fraction in the 60-65% range.  (b) echocardiogram on 11/29/2017 showed an EF of 55-60%  (3) definitive surgery to follow  (4) adjuvant radiation to follow surgery  (5) will  start antiestrogens at the completion of local therapy  PLAN:  Madison Hopkins is doing well today and she tolerated her sixth cycle of chemotherapy very well.  We reviewed her breast MRI and Dr. Jana Hakim saw her today as well.  He showed her the images of her MRI, and we reviewed her  upcoming appointment with Dr. Dalbert Batman on 11/12 for surgical planning.  She will return on 11/14 for labs and her next Trastuzumab and in early December for lab, f/u, and Trastuzumab.    She knows to call between now and her next appointment for any questions or concerns.  A total of (30) minutes of face-to-face time was spent with this patient with greater than 50% of that time in counseling and care-coordination.   Wilber Bihari, NP  01/03/18 10:29 AM Medical Oncology and Hematology [facility] [address] [place], [place] [address] Tel. [phone]    Fax. [phone]

## 2018-01-03 NOTE — Telephone Encounter (Signed)
Gave pt avs and calendar.  Pt asked to r/s appts

## 2018-01-09 ENCOUNTER — Other Ambulatory Visit: Payer: Self-pay | Admitting: General Surgery

## 2018-01-09 DIAGNOSIS — Z90722 Acquired absence of ovaries, bilateral: Secondary | ICD-10-CM | POA: Diagnosis not present

## 2018-01-09 DIAGNOSIS — C50912 Malignant neoplasm of unspecified site of left female breast: Secondary | ICD-10-CM | POA: Diagnosis not present

## 2018-01-09 DIAGNOSIS — C50412 Malignant neoplasm of upper-outer quadrant of left female breast: Secondary | ICD-10-CM

## 2018-01-09 DIAGNOSIS — Z90710 Acquired absence of both cervix and uterus: Secondary | ICD-10-CM | POA: Diagnosis not present

## 2018-01-10 ENCOUNTER — Other Ambulatory Visit: Payer: Self-pay | Admitting: Oncology

## 2018-01-10 ENCOUNTER — Other Ambulatory Visit: Payer: Self-pay | Admitting: General Surgery

## 2018-01-10 DIAGNOSIS — C50412 Malignant neoplasm of upper-outer quadrant of left female breast: Secondary | ICD-10-CM

## 2018-01-11 ENCOUNTER — Inpatient Hospital Stay: Payer: 59

## 2018-01-11 ENCOUNTER — Other Ambulatory Visit: Payer: 59

## 2018-01-11 ENCOUNTER — Ambulatory Visit: Payer: 59

## 2018-01-11 VITALS — BP 140/86 | HR 71 | Temp 98.8°F | Resp 18

## 2018-01-11 DIAGNOSIS — C50412 Malignant neoplasm of upper-outer quadrant of left female breast: Secondary | ICD-10-CM

## 2018-01-11 DIAGNOSIS — Z170 Estrogen receptor positive status [ER+]: Principal | ICD-10-CM

## 2018-01-11 DIAGNOSIS — Z95828 Presence of other vascular implants and grafts: Secondary | ICD-10-CM

## 2018-01-11 DIAGNOSIS — Z5111 Encounter for antineoplastic chemotherapy: Secondary | ICD-10-CM | POA: Diagnosis not present

## 2018-01-11 LAB — CBC WITH DIFFERENTIAL/PLATELET
Abs Immature Granulocytes: 0 10*3/uL (ref 0.00–0.07)
Basophils Absolute: 0 10*3/uL (ref 0.0–0.1)
Basophils Relative: 0 %
Eosinophils Absolute: 0.2 10*3/uL (ref 0.0–0.5)
Eosinophils Relative: 3 %
HCT: 28.3 % — ABNORMAL LOW (ref 36.0–46.0)
Hemoglobin: 8.8 g/dL — ABNORMAL LOW (ref 12.0–15.0)
Immature Granulocytes: 0 %
Lymphocytes Relative: 45 %
Lymphs Abs: 2.6 10*3/uL (ref 0.7–4.0)
MCH: 30.3 pg (ref 26.0–34.0)
MCHC: 31.1 g/dL (ref 30.0–36.0)
MCV: 97.6 fL (ref 80.0–100.0)
Monocytes Absolute: 0.5 10*3/uL (ref 0.1–1.0)
Monocytes Relative: 9 %
Neutro Abs: 2.6 10*3/uL (ref 1.7–7.7)
Neutrophils Relative %: 43 %
Platelets: 208 10*3/uL (ref 150–400)
RBC: 2.9 MIL/uL — ABNORMAL LOW (ref 3.87–5.11)
RDW: 16 % — ABNORMAL HIGH (ref 11.5–15.5)
WBC: 5.9 10*3/uL (ref 4.0–10.5)
nRBC: 0 % (ref 0.0–0.2)

## 2018-01-11 LAB — COMPREHENSIVE METABOLIC PANEL
ALT: 23 U/L (ref 0–44)
AST: 19 U/L (ref 15–41)
Albumin: 4.1 g/dL (ref 3.5–5.0)
Alkaline Phosphatase: 130 U/L — ABNORMAL HIGH (ref 38–126)
Anion gap: 9 (ref 5–15)
BUN: 4 mg/dL — ABNORMAL LOW (ref 8–23)
CO2: 25 mmol/L (ref 22–32)
Calcium: 9.6 mg/dL (ref 8.9–10.3)
Chloride: 108 mmol/L (ref 98–111)
Creatinine, Ser: 0.81 mg/dL (ref 0.44–1.00)
GFR calc Af Amer: >60 mL/min (ref >60)
GFR calc non Af Amer: >60 mL/min (ref >60)
Glucose, Bld: 106 mg/dL — ABNORMAL HIGH (ref 70–99)
Potassium: 3.4 mmol/L — ABNORMAL LOW (ref 3.5–5.1)
Sodium: 142 mmol/L (ref 135–145)
Total Bilirubin: 0.5 mg/dL (ref 0.3–1.2)
Total Protein: 7 g/dL (ref 6.5–8.1)

## 2018-01-11 MED ORDER — ACETAMINOPHEN 325 MG PO TABS
ORAL_TABLET | ORAL | Status: AC
  Filled 2018-01-11: qty 2

## 2018-01-11 MED ORDER — SODIUM CHLORIDE 0.9% FLUSH
10.0000 mL | INTRAVENOUS | Status: DC | PRN
  Administered 2018-01-11: 10 mL
  Filled 2018-01-11: qty 10

## 2018-01-11 MED ORDER — DIPHENHYDRAMINE HCL 25 MG PO CAPS
ORAL_CAPSULE | ORAL | Status: AC
  Filled 2018-01-11: qty 1

## 2018-01-11 MED ORDER — SODIUM CHLORIDE 0.9% FLUSH
10.0000 mL | Freq: Once | INTRAVENOUS | Status: AC
  Administered 2018-01-11: 10 mL
  Filled 2018-01-11: qty 10

## 2018-01-11 MED ORDER — HEPARIN SOD (PORK) LOCK FLUSH 100 UNIT/ML IV SOLN
500.0000 [IU] | Freq: Once | INTRAVENOUS | Status: AC | PRN
  Administered 2018-01-11: 500 [IU]
  Filled 2018-01-11: qty 5

## 2018-01-11 MED ORDER — DIPHENHYDRAMINE HCL 25 MG PO CAPS
25.0000 mg | ORAL_CAPSULE | Freq: Once | ORAL | Status: AC
  Administered 2018-01-11: 25 mg via ORAL

## 2018-01-11 MED ORDER — TRASTUZUMAB CHEMO 150 MG IV SOLR
6.0000 mg/kg | Freq: Once | INTRAVENOUS | Status: AC
  Administered 2018-01-11: 546 mg via INTRAVENOUS
  Filled 2018-01-11: qty 26

## 2018-01-11 MED ORDER — ACETAMINOPHEN 325 MG PO TABS
650.0000 mg | ORAL_TABLET | Freq: Once | ORAL | Status: AC
  Administered 2018-01-11: 650 mg via ORAL

## 2018-01-11 MED ORDER — SODIUM CHLORIDE 0.9 % IV SOLN
Freq: Once | INTRAVENOUS | Status: AC
  Administered 2018-01-11: 14:00:00 via INTRAVENOUS
  Filled 2018-01-11: qty 250

## 2018-01-11 NOTE — Patient Instructions (Signed)
[facility] Discharge Instructions for Patients Receiving Chemotherapy  Today you received the following chemotherapy agents: Trastuzumab (Herceptin).  To help prevent nausea and vomiting after your treatment, we encourage you to take your nausea medication as prescribed. If you develop nausea and vomiting that is not controlled by your nausea medication, call the clinic.   BELOW ARE SYMPTOMS THAT SHOULD BE REPORTED IMMEDIATELY:  *FEVER GREATER THAN 100.5 F  *CHILLS WITH OR WITHOUT FEVER  NAUSEA AND VOMITING THAT IS NOT CONTROLLED WITH YOUR NAUSEA MEDICATION  *UNUSUAL SHORTNESS OF BREATH  *UNUSUAL BRUISING OR BLEEDING  TENDERNESS IN MOUTH AND THROAT WITH OR WITHOUT PRESENCE OF ULCERS  *URINARY PROBLEMS  *BOWEL PROBLEMS  UNUSUAL RASH Items with * indicate a potential emergency and should be followed up as soon as possible.  Feel free to call the clinic should you have any questions or concerns. The clinic phone number is [phone].  Please show the CHEMO ALERT CARD at check-in to the Emergency Department and triage nurse.   

## 2018-01-12 ENCOUNTER — Other Ambulatory Visit: Payer: Self-pay

## 2018-01-12 ENCOUNTER — Encounter (HOSPITAL_BASED_OUTPATIENT_CLINIC_OR_DEPARTMENT_OTHER): Payer: Self-pay | Admitting: *Deleted

## 2018-01-12 DIAGNOSIS — H60509 Unspecified acute noninfective otitis externa, unspecified ear: Secondary | ICD-10-CM | POA: Diagnosis not present

## 2018-01-12 DIAGNOSIS — C801 Malignant (primary) neoplasm, unspecified: Secondary | ICD-10-CM | POA: Insufficient documentation

## 2018-01-12 DIAGNOSIS — C50919 Malignant neoplasm of unspecified site of unspecified female breast: Secondary | ICD-10-CM | POA: Insufficient documentation

## 2018-01-12 DIAGNOSIS — Z9109 Other allergy status, other than to drugs and biological substances: Secondary | ICD-10-CM | POA: Insufficient documentation

## 2018-01-13 ENCOUNTER — Ambulatory Visit: Payer: 59 | Admitting: Emergency Medicine

## 2018-01-19 ENCOUNTER — Other Ambulatory Visit: Payer: Self-pay | Admitting: General Surgery

## 2018-01-19 ENCOUNTER — Ambulatory Visit
Admission: RE | Admit: 2018-01-19 | Discharge: 2018-01-19 | Disposition: A | Discharge status: Home / Self Care | Payer: 59 | Source: Ambulatory Visit | Attending: General Surgery | Admitting: General Surgery

## 2018-01-19 DIAGNOSIS — C50412 Malignant neoplasm of upper-outer quadrant of left female breast: Secondary | ICD-10-CM

## 2018-01-19 DIAGNOSIS — N6325 Unspecified lump in the left breast, overlapping quadrants: Secondary | ICD-10-CM | POA: Diagnosis not present

## 2018-01-19 NOTE — Progress Notes (Signed)
Ensure pre surgery drink given with instructions to complete by 0445 dos, surgical soap given with instructions, pt verbalized understanding. 

## 2018-01-20 NOTE — H&P (Signed)
Madison Hopkins Location: [facility] Patient #: [id number] DOB: 04-12-1955 Single / Language: [name] / Race: Black or African American Female       History of Present Illness      . This is a 62 year old female who has completed her neoadjuvant chemotherapy and presents with her son and husband to discuss and plan definitive surgery for her left breast cancer. She has chosen to transfer her surgical care to another surgeon. Dr. Jana Hakim and Dr. Lisbeth Renshaw are involved in her care. [name], PA is her PCP. Sharen Counter is her gynecologist.      In June she presented with a small palpable mass in the upper outer quadrant of the left breast and a mass in the left axilla. Mammogram showed a 2.2 cm mass of the 2 o'clock position of the left breast and 2 enlarged left lymph nodes left axilla. Biopsy of the left breast mass showed grade 3 invasive ductal carcinoma, ER 30%, PR 0, HER-2 positive. Biopsy of a single left axillary lymph node showed metastatic carcinoma. Subsequent MRI showed a 2.5 cm mass in the upper outer quadrant of the left breast and also an area of enhancement extending anteriorly so the entire area of involvement appeared to be 5 cm. No further biopsies have been done The MRI showed at least 3 enlarged lymph nodes The right breast and axilla looked normal Port-A-Cath was placed on the right side.     She has completed cytotoxic chemotherapy and MRI was performed on December 27, 2017. This shows a complete radiologic response in the left breast. The lymph nodes in the left axilla are still abnormal but they are smaller. The right breast looks normal. I discussed this with her.     Family history is negative for breast or ovarian cancer.  past history significant for two-stage colon resection 1995 for ruptured diverticulitis. Multiple C-sections. TAH and BSO. Last colonoscopy 2018. Left breast biopsy for benign disease Social history reveals she drinks  alcohol rarely. Denies cigarettes but does smoke marijuana. Has 2 children, son and daughter. She works as a Scientist, water quality for [employer].    We had a very long discussion. We talked about lumpectomy with radioactive seed localization, complete axillary lymph node dissection, mastectomy with or without reconstruction. We talked about survival issues. We talked about cosmetic issues.  We talked about the fact that she will need Herceptin for a year.  We talked about the fact she will need radiation therapy regardless of the extent of surgery.       At the end of all of this she has decided to schedule for left breast lumpectomy with radioactive seed localization, complete left axillary lymph node dissection I have discussed the indications, details, techniques, and risk of the surgery in detail with her and her family. She is aware of the risk of bleeding, infection, reoperation for positive margins, arm swelling, arm numbness, shoulder disability cosmetic deformity. She understands all of these issues. All of her questions were answered. She agrees with this plan. She knows she will need to stay overnight because I will leave a drain in the axilla   Allergies  Allergies Reconciled  Penicillins   Medication History  LORazepam (0.'5MG'$  Tablet, Oral) Active. valACYclovir HCl ('500MG'$  Tablet, Oral) Active. Prochlorperazine Maleate ('10MG'$  Tablet, Oral) Active. Lidocaine-Prilocaine (2.5-2.5% Cream, External) Active. Fluconazole ('100MG'$  Tablet, Oral) Active. Fluticasone Propionate (50MCG/ACT Suspension, Nasal) Active. Levocetirizine Dihydrochloride ('5MG'$  Tablet, Oral) Active. Glucosamine Chondroitin Complx (500-'400MG'$  Tablet, Oral) Active. Medications  Reconciled  Vitals  Weight: 195 lb Height: 69in Body Surface Area: 2.04 m Body Mass Index: 28.8 kg/m  Temp.: 98.48F  Pulse: 85 (Regular)  BP: 130/80 (Sitting, Left Arm, Standard)     Physical  Exam General Mental Status-Alert. General Appearance-Not in acute distress. Build & Nutrition-Well nourished. Posture-Normal posture. Gait-Normal.  Head and Neck Head-normocephalic, atraumatic with no lesions or palpable masses. Trachea-midline. Thyroid Gland Characteristics - normal size and consistency and no palpable nodules.  Chest and Lung Exam Chest and lung exam reveals -on auscultation, normal breath sounds, no adventitious sounds and normal vocal resonance.  Breast Note: Breasts are relatively large. No palpable mass or skin change otherwise. Port-A-Cath right infraclavicular area looks fine.   Cardiovascular Cardiovascular examination reveals -normal heart sounds, regular rate and rhythm with no murmurs and femoral artery auscultation bilaterally reveals normal pulses, no bruits, no thrills.  Abdomen Inspection Inspection of the abdomen reveals - No Hernias. Palpation/Percussion Palpation and Percussion of the abdomen reveal - Soft, Non Tender, No Rigidity (guarding), No hepatosplenomegaly and No Palpable abdominal masses. Note: Well-healed Pfannenstiel incision. Well-healed long midline incision. Colostomy site scar left of umbilicus. No organomegaly. No hernias obvious   Neurologic Neurologic evaluation reveals -alert and oriented x 3 with no impairment of recent or remote memory, normal attention span and ability to concentrate, normal sensation and normal coordination.  Musculoskeletal Normal Exam - Bilateral-Upper Extremity Strength Normal and Lower Extremity Strength Normal.    Assessment & Plan  BREAST CANCER, STAGE 2, LEFT (C50.912) Story: Left breast biopsy and left axillary lymph node biopsy - 08/07/2017 ([id number]) - IDC, grade 3, ER - 30%, PR - 0%, Ki67 - 80%, Her2 - POSITIVE, 1/1 nodes     You have had a good response to your chemotherapy The MRI shows no enhancement or evidence of cancer in the breast The lymph nodes are  still enlarged but they are smaller It is time to go ahead and plan your definitive breast surgery you will need Herceptin for 1 year so we will leave the port in  We spent a long time talking about the difference in lumpectomy with radioactive seed localization and mastectomy with or without reconstruction You will need postop radiation therapy regardless of the procedure because of your positive nodes you will need a complete axillary lymph node dissection because of the number of abnormal nodes that you had initially We have decided to schedule you for left breast lumpectomy with radioactive seed localization, complete left axillary lymph node dissection. you will be admitted to the hospital overnight for observation I discussed the indications, techniques, and risk of the surgery in detail   HISTORY OF TOTAL ABDOMINAL HYSTERECTOMY AND BILATERAL SALPINGO-OOPHORECTOMY (Z90.710) MULT LB/SB-BY C-SECTION (Z38.69) HISTORY OF COLOSTOMY REVERSAL (Z98.890) HISTORY OF PARTIAL COLECTOMY (Z90.49)     Edsel Petrin. Dalbert Batman, M.D., [facility] General and Minimally invasive Surgery Breast and Colorectal Surgery Office:   [phone] Pager:   [phone]

## 2018-01-22 ENCOUNTER — Ambulatory Visit (HOSPITAL_BASED_OUTPATIENT_CLINIC_OR_DEPARTMENT_OTHER): Payer: 59 | Admitting: Anesthesiology

## 2018-01-22 ENCOUNTER — Ambulatory Visit
Admission: RE | Admit: 2018-01-22 | Discharge: 2018-01-22 | Disposition: A | Discharge status: Home / Self Care | Payer: 59 | Source: Ambulatory Visit | Attending: General Surgery | Admitting: General Surgery

## 2018-01-22 ENCOUNTER — Other Ambulatory Visit: Payer: Self-pay

## 2018-01-22 ENCOUNTER — Encounter (HOSPITAL_BASED_OUTPATIENT_CLINIC_OR_DEPARTMENT_OTHER): Payer: Self-pay

## 2018-01-22 ENCOUNTER — Encounter (HOSPITAL_BASED_OUTPATIENT_CLINIC_OR_DEPARTMENT_OTHER)
Admission: RE | Disposition: A | Discharge status: Home / Self Care | Payer: Self-pay | Source: Ambulatory Visit | Attending: General Surgery

## 2018-01-22 ENCOUNTER — Ambulatory Visit (HOSPITAL_BASED_OUTPATIENT_CLINIC_OR_DEPARTMENT_OTHER)
Admission: RE | Admit: 2018-01-22 | Discharge: 2018-01-23 | Disposition: A | Discharge status: Home / Self Care | Payer: 59 | Source: Ambulatory Visit | Attending: General Surgery | Admitting: General Surgery

## 2018-01-22 DIAGNOSIS — Z170 Estrogen receptor positive status [ER+]: Secondary | ICD-10-CM

## 2018-01-22 DIAGNOSIS — G8918 Other acute postprocedural pain: Secondary | ICD-10-CM | POA: Diagnosis not present

## 2018-01-22 DIAGNOSIS — Z9221 Personal history of antineoplastic chemotherapy: Secondary | ICD-10-CM | POA: Diagnosis not present

## 2018-01-22 DIAGNOSIS — C773 Secondary and unspecified malignant neoplasm of axilla and upper limb lymph nodes: Secondary | ICD-10-CM | POA: Diagnosis not present

## 2018-01-22 DIAGNOSIS — C50412 Malignant neoplasm of upper-outer quadrant of left female breast: Secondary | ICD-10-CM | POA: Insufficient documentation

## 2018-01-22 DIAGNOSIS — R928 Other abnormal and inconclusive findings on diagnostic imaging of breast: Secondary | ICD-10-CM | POA: Diagnosis not present

## 2018-01-22 DIAGNOSIS — C50912 Malignant neoplasm of unspecified site of left female breast: Secondary | ICD-10-CM | POA: Diagnosis not present

## 2018-01-22 HISTORY — PX: BREAST LUMPECTOMY WITH RADIOACTIVE SEED AND AXILLARY LYMPH NODE DISSECTION: SHX6656

## 2018-01-22 HISTORY — PX: BREAST LUMPECTOMY: SHX2

## 2018-01-22 SURGERY — BREAST LUMPECTOMY WITH RADIOACTIVE SEED AND AXILLARY LYMPH NODE DISSECTION
Anesthesia: General | Site: Breast | Laterality: Left

## 2018-01-22 MED ORDER — PHENYLEPHRINE HCL 10 MG/ML IJ SOLN
INTRAMUSCULAR | Status: DC | PRN
  Administered 2018-01-22: 40 ug via INTRAVENOUS

## 2018-01-22 MED ORDER — MIDAZOLAM HCL 2 MG/2ML IJ SOLN
INTRAMUSCULAR | Status: AC
  Filled 2018-01-22: qty 2

## 2018-01-22 MED ORDER — CELECOXIB 200 MG PO CAPS
ORAL_CAPSULE | ORAL | Status: AC
  Filled 2018-01-22: qty 1

## 2018-01-22 MED ORDER — HYDROMORPHONE HCL 1 MG/ML IJ SOLN
INTRAMUSCULAR | Status: AC
  Filled 2018-01-22: qty 0.5

## 2018-01-22 MED ORDER — HYDROMORPHONE HCL 1 MG/ML IJ SOLN
0.2500 mg | INTRAMUSCULAR | Status: DC | PRN
  Administered 2018-01-22: 0.5 mg via INTRAVENOUS
  Administered 2018-01-22: 0.25 mg via INTRAVENOUS
  Administered 2018-01-22 (×2): 0.5 mg via INTRAVENOUS
  Administered 2018-01-22: 0.25 mg via INTRAVENOUS

## 2018-01-22 MED ORDER — ONDANSETRON HCL 4 MG/2ML IJ SOLN
INTRAMUSCULAR | Status: AC
  Filled 2018-01-22: qty 2

## 2018-01-22 MED ORDER — SODIUM CHLORIDE (PF) 0.9 % IJ SOLN
INTRAMUSCULAR | Status: AC
  Filled 2018-01-22: qty 10

## 2018-01-22 MED ORDER — LACTATED RINGERS IV SOLN
INTRAVENOUS | Status: DC
  Administered 2018-01-22 (×2): via INTRAVENOUS

## 2018-01-22 MED ORDER — FENTANYL CITRATE (PF) 100 MCG/2ML IJ SOLN
INTRAMUSCULAR | Status: AC
  Filled 2018-01-22: qty 2

## 2018-01-22 MED ORDER — ONDANSETRON HCL 4 MG/2ML IJ SOLN: 4.0000 mg | Freq: Once | INTRAMUSCULAR | Status: DC | PRN

## 2018-01-22 MED ORDER — ACETAMINOPHEN 650 MG RE SUPP: 650.0000 mg | RECTAL | Status: DC | PRN

## 2018-01-22 MED ORDER — SODIUM CHLORIDE 0.9 % IV SOLN: 250.0000 mL | INTRAVENOUS | Status: DC | PRN

## 2018-01-22 MED ORDER — MORPHINE SULFATE (PF) 4 MG/ML IV SOLN
2.0000 mg | INTRAVENOUS | Status: DC | PRN
  Administered 2018-01-22 – 2018-01-23 (×4): 2 mg via INTRAVENOUS
  Filled 2018-01-22 (×3): qty 1

## 2018-01-22 MED ORDER — BUPIVACAINE-EPINEPHRINE (PF) 0.5% -1:200000 IJ SOLN
INTRAMUSCULAR | Status: DC | PRN
  Administered 2018-01-22: 30 mL via PERINEURAL

## 2018-01-22 MED ORDER — DEXAMETHASONE SODIUM PHOSPHATE 10 MG/ML IJ SOLN
INTRAMUSCULAR | Status: AC
  Filled 2018-01-22: qty 1

## 2018-01-22 MED ORDER — ACETAMINOPHEN 500 MG PO TABS: 1000.0000 mg | ORAL_TABLET | ORAL | Status: AC

## 2018-01-22 MED ORDER — CHLORHEXIDINE GLUCONATE CLOTH 2 % EX PADS: 6.0000 | MEDICATED_PAD | Freq: Once | CUTANEOUS | Status: DC

## 2018-01-22 MED ORDER — OXYCODONE HCL 5 MG PO TABS
5.0000 mg | ORAL_TABLET | ORAL | Status: DC | PRN
  Administered 2018-01-22 (×2): 10 mg via ORAL
  Filled 2018-01-22 (×2): qty 2

## 2018-01-22 MED ORDER — SODIUM CHLORIDE 0.9% FLUSH: 3.0000 mL | Freq: Two times a day (BID) | INTRAVENOUS | Status: DC

## 2018-01-22 MED ORDER — PROPOFOL 10 MG/ML IV BOLUS
INTRAVENOUS | Status: DC | PRN
  Administered 2018-01-22: 150 mg via INTRAVENOUS

## 2018-01-22 MED ORDER — LIDOCAINE 2% (20 MG/ML) 5 ML SYRINGE
INTRAMUSCULAR | Status: AC
  Filled 2018-01-22: qty 5

## 2018-01-22 MED ORDER — CEFAZOLIN SODIUM-DEXTROSE 2-4 GM/100ML-% IV SOLN
2.0000 g | INTRAVENOUS | Status: AC
  Administered 2018-01-22: 2 g via INTRAVENOUS

## 2018-01-22 MED ORDER — DEXAMETHASONE SODIUM PHOSPHATE 4 MG/ML IJ SOLN
INTRAMUSCULAR | Status: DC | PRN
  Administered 2018-01-22: 8 mg via INTRAVENOUS

## 2018-01-22 MED ORDER — GABAPENTIN 300 MG PO CAPS
300.0000 mg | ORAL_CAPSULE | ORAL | Status: AC
  Administered 2018-01-22: 300 mg via ORAL

## 2018-01-22 MED ORDER — BUPIVACAINE-EPINEPHRINE (PF) 0.25% -1:200000 IJ SOLN
INTRAMUSCULAR | Status: DC | PRN
  Administered 2018-01-22: 20 mL

## 2018-01-22 MED ORDER — DOCUSATE SODIUM 100 MG PO CAPS: 100.0000 mg | ORAL_CAPSULE | Freq: Two times a day (BID) | ORAL | Status: DC

## 2018-01-22 MED ORDER — LACTATED RINGERS IV SOLN
INTRAVENOUS | Status: DC
  Administered 2018-01-22: 12:00:00 via INTRAVENOUS

## 2018-01-22 MED ORDER — PHENYLEPHRINE 40 MCG/ML (10ML) SYRINGE FOR IV PUSH (FOR BLOOD PRESSURE SUPPORT)
PREFILLED_SYRINGE | INTRAVENOUS | Status: AC
  Filled 2018-01-22: qty 10

## 2018-01-22 MED ORDER — CEFAZOLIN SODIUM-DEXTROSE 2-4 GM/100ML-% IV SOLN
INTRAVENOUS | Status: AC
  Filled 2018-01-22: qty 100

## 2018-01-22 MED ORDER — ACETAMINOPHEN 500 MG PO TABS
1000.0000 mg | ORAL_TABLET | Freq: Four times a day (QID) | ORAL | Status: AC
  Administered 2018-01-22 – 2018-01-23 (×3): 1000 mg via ORAL
  Filled 2018-01-22 (×3): qty 2

## 2018-01-22 MED ORDER — GABAPENTIN 300 MG PO CAPS
ORAL_CAPSULE | ORAL | Status: AC
  Filled 2018-01-22: qty 1

## 2018-01-22 MED ORDER — SODIUM CHLORIDE 0.9 % IV SOLN
INTRAVENOUS | Status: DC | PRN
  Administered 2018-01-22: 40 ug/min via INTRAVENOUS

## 2018-01-22 MED ORDER — CELECOXIB 200 MG PO CAPS: 200.0000 mg | ORAL_CAPSULE | ORAL | Status: AC

## 2018-01-22 MED ORDER — MEPERIDINE HCL 25 MG/ML IJ SOLN: 6.2500 mg | INTRAMUSCULAR | Status: DC | PRN

## 2018-01-22 MED ORDER — CELECOXIB 200 MG PO CAPS
200.0000 mg | ORAL_CAPSULE | ORAL | Status: AC
  Administered 2018-01-22: 200 mg via ORAL

## 2018-01-22 MED ORDER — LIDOCAINE HCL (CARDIAC) PF 100 MG/5ML IV SOSY
PREFILLED_SYRINGE | INTRAVENOUS | Status: DC | PRN
  Administered 2018-01-22: 100 mg via INTRAVENOUS

## 2018-01-22 MED ORDER — EPHEDRINE SULFATE 50 MG/ML IJ SOLN
INTRAMUSCULAR | Status: DC | PRN
  Administered 2018-01-22 (×2): 15 mg via INTRAVENOUS

## 2018-01-22 MED ORDER — MIDAZOLAM HCL 2 MG/2ML IJ SOLN
1.0000 mg | INTRAMUSCULAR | Status: DC | PRN
  Administered 2018-01-22: 2 mg via INTRAVENOUS

## 2018-01-22 MED ORDER — EPHEDRINE 5 MG/ML INJ
INTRAVENOUS | Status: AC
  Filled 2018-01-22: qty 10

## 2018-01-22 MED ORDER — ACETAMINOPHEN 500 MG PO TABS
1000.0000 mg | ORAL_TABLET | ORAL | Status: AC
  Administered 2018-01-22: 1000 mg via ORAL

## 2018-01-22 MED ORDER — CEFAZOLIN SODIUM-DEXTROSE 2-4 GM/100ML-% IV SOLN: 2.0000 g | INTRAVENOUS | Status: AC

## 2018-01-22 MED ORDER — SCOPOLAMINE 1 MG/3DAYS TD PT72: 1.0000 | MEDICATED_PATCH | Freq: Once | TRANSDERMAL | Status: DC | PRN

## 2018-01-22 MED ORDER — HYDROCODONE-ACETAMINOPHEN 5-325 MG PO TABS: 1.0000 | ORAL_TABLET | Freq: Four times a day (QID) | ORAL | 0 refills | Status: DC | PRN

## 2018-01-22 MED ORDER — SODIUM CHLORIDE 0.9% FLUSH: 3.0000 mL | INTRAVENOUS | Status: DC | PRN

## 2018-01-22 MED ORDER — ACETAMINOPHEN 325 MG PO TABS
650.0000 mg | ORAL_TABLET | ORAL | Status: DC | PRN
  Filled 2018-01-22: qty 2

## 2018-01-22 MED ORDER — ACETAMINOPHEN 500 MG PO TABS
ORAL_TABLET | ORAL | Status: AC
  Filled 2018-01-22: qty 2

## 2018-01-22 MED ORDER — SUCCINYLCHOLINE CHLORIDE 200 MG/10ML IV SOSY
PREFILLED_SYRINGE | INTRAVENOUS | Status: AC
  Filled 2018-01-22: qty 10

## 2018-01-22 MED ORDER — GABAPENTIN 300 MG PO CAPS: 300.0000 mg | ORAL_CAPSULE | ORAL | Status: AC

## 2018-01-22 MED ORDER — FENTANYL CITRATE (PF) 100 MCG/2ML IJ SOLN
50.0000 ug | INTRAMUSCULAR | Status: DC | PRN
  Administered 2018-01-22: 50 ug via INTRAVENOUS
  Administered 2018-01-22: 100 ug via INTRAVENOUS

## 2018-01-22 SURGICAL SUPPLY — 67 items
ADH SKN CLS APL DERMABOND .7 (GAUZE/BANDAGES/DRESSINGS) ×1
APPLIER CLIP 9.375 MED OPEN (MISCELLANEOUS) ×4
APR CLP MED 9.3 20 MLT OPN (MISCELLANEOUS) ×2
BINDER BREAST XLRG (GAUZE/BANDAGES/DRESSINGS) IMPLANT
BINDER BREAST XXLRG (GAUZE/BANDAGES/DRESSINGS) ×1 IMPLANT
BLADE HEX COATED 2.75 (ELECTRODE) ×2 IMPLANT
BLADE SURG 10 STRL SS (BLADE) ×1 IMPLANT
BLADE SURG 15 STRL LF DISP TIS (BLADE) ×1 IMPLANT
BLADE SURG 15 STRL SS (BLADE) ×2
CANISTER SUCT 1200ML W/VALVE (MISCELLANEOUS) ×2 IMPLANT
CHLORAPREP W/TINT 26ML (MISCELLANEOUS) ×2 IMPLANT
CLIP APPLIE 9.375 MED OPEN (MISCELLANEOUS) ×1 IMPLANT
COVER BACK TABLE 60X90IN (DRAPES) ×2 IMPLANT
COVER MAYO STAND STRL (DRAPES) ×2 IMPLANT
COVER PROBE W GEL 5X96 (DRAPES) ×2 IMPLANT
COVER WAND RF STERILE (DRAPES) IMPLANT
DECANTER SPIKE VIAL GLASS SM (MISCELLANEOUS) IMPLANT
DERMABOND ADVANCED (GAUZE/BANDAGES/DRESSINGS) ×1
DERMABOND ADVANCED .7 DNX12 (GAUZE/BANDAGES/DRESSINGS) ×1 IMPLANT
DEVICE DUBIN W/COMP PLATE 8390 (MISCELLANEOUS) ×2 IMPLANT
DRAPE LAPAROSCOPIC ABDOMINAL (DRAPES) ×2 IMPLANT
DRAPE UTILITY XL STRL (DRAPES) ×2 IMPLANT
DRSG PAD ABDOMINAL 8X10 ST (GAUZE/BANDAGES/DRESSINGS) ×2 IMPLANT
ELECT REM PT RETURN 9FT ADLT (ELECTROSURGICAL) ×2
ELECTRODE REM PT RTRN 9FT ADLT (ELECTROSURGICAL) ×1 IMPLANT
GAUZE SPONGE 4X4 12PLY STRL (GAUZE/BANDAGES/DRESSINGS) ×1 IMPLANT
GLOVE BIO SURGEON STRL SZ 6.5 (GLOVE) ×1 IMPLANT
GLOVE BIO SURGEON STRL SZ7 (GLOVE) ×1 IMPLANT
GLOVE BIOGEL PI IND STRL 7.0 (GLOVE) IMPLANT
GLOVE BIOGEL PI IND STRL 7.5 (GLOVE) IMPLANT
GLOVE BIOGEL PI INDICATOR 7.0 (GLOVE) ×1
GLOVE BIOGEL PI INDICATOR 7.5 (GLOVE) ×1
GLOVE EUDERMIC 7 POWDERFREE (GLOVE) ×4 IMPLANT
GOWN STRL REUS W/ TWL LRG LVL3 (GOWN DISPOSABLE) ×2 IMPLANT
GOWN STRL REUS W/ TWL XL LVL3 (GOWN DISPOSABLE) ×1 IMPLANT
GOWN STRL REUS W/TWL LRG LVL3 (GOWN DISPOSABLE) ×4
GOWN STRL REUS W/TWL XL LVL3 (GOWN DISPOSABLE) ×2
HEMOSTAT SNOW SURGICEL 2X4 (HEMOSTASIS) ×1 IMPLANT
ILLUMINATOR WAVEGUIDE N/F (MISCELLANEOUS) IMPLANT
KIT MARKER MARGIN INK (KITS) ×2 IMPLANT
LIGHT WAVEGUIDE WIDE FLAT (MISCELLANEOUS) IMPLANT
NDL HYPO 25X1 1.5 SAFETY (NEEDLE) ×2 IMPLANT
NDL SAFETY ECLIPSE 18X1.5 (NEEDLE) ×1 IMPLANT
NEEDLE HYPO 18GX1.5 SHARP (NEEDLE) ×2
NEEDLE HYPO 25X1 1.5 SAFETY (NEEDLE) ×4 IMPLANT
NS IRRIG 1000ML POUR BTL (IV SOLUTION) ×2 IMPLANT
PACK BASIN DAY SURGERY FS (CUSTOM PROCEDURE TRAY) ×2 IMPLANT
PENCIL BUTTON HOLSTER BLD 10FT (ELECTRODE) ×2 IMPLANT
SHEET MEDIUM DRAPE 40X70 STRL (DRAPES) ×1 IMPLANT
SLEEVE SCD COMPRESS KNEE MED (MISCELLANEOUS) ×2 IMPLANT
SPONGE LAP 18X18 RF (DISPOSABLE) ×1 IMPLANT
SPONGE LAP 4X18 RFD (DISPOSABLE) ×5 IMPLANT
STRIP CLOSURE SKIN 1/2X4 (GAUZE/BANDAGES/DRESSINGS) IMPLANT
SUT MNCRL AB 4-0 PS2 18 (SUTURE) ×3 IMPLANT
SUT SILK 2 0 SH (SUTURE) ×2 IMPLANT
SUT SILK 2 0 TIES 17X18 (SUTURE)
SUT SILK 2-0 18XBRD TIE BLK (SUTURE) IMPLANT
SUT VIC AB 2-0 CT1 27 (SUTURE)
SUT VIC AB 2-0 CT1 TAPERPNT 27 (SUTURE) IMPLANT
SUT VIC AB 3-0 SH 27 (SUTURE)
SUT VIC AB 3-0 SH 27X BRD (SUTURE) IMPLANT
SUT VICRYL 3-0 CR8 SH (SUTURE) ×3 IMPLANT
SYR 10ML LL (SYRINGE) ×4 IMPLANT
TOWEL GREEN STERILE FF (TOWEL DISPOSABLE) ×2 IMPLANT
TOWEL OR NON WOVEN STRL DISP B (DISPOSABLE) ×2 IMPLANT
TUBE CONNECTING 20X1/4 (TUBING) ×2 IMPLANT
YANKAUER SUCT BULB TIP NO VENT (SUCTIONS) ×2 IMPLANT

## 2018-01-22 NOTE — Addendum Note (Signed)
Addendum  created 01/22/18 2018 by Willa Frater, CRNA   Charge Capture section accepted

## 2018-01-22 NOTE — Anesthesia Preprocedure Evaluation (Signed)
Anesthesia Evaluation  Patient identified by MRN, date of birth, ID band Patient awake    Reviewed: Allergy & Precautions, NPO status , Patient's Chart, lab work & pertinent test results  Airway Mallampati: I  TM Distance: >3 FB Neck ROM: Full    Dental   Pulmonary    Pulmonary exam normal        Cardiovascular Normal cardiovascular exam     Neuro/Psych    GI/Hepatic   Endo/Other    Renal/GU      Musculoskeletal   Abdominal   Peds  Hematology   Anesthesia Other Findings   Reproductive/Obstetrics                             Anesthesia Physical Anesthesia Plan  ASA: II  Anesthesia Plan: General   Post-op Pain Management:  Regional for Post-op pain   Induction: Intravenous  PONV Risk Score and Plan: 3 and Ondansetron, Midazolam and Dexamethasone  Airway Management Planned: LMA  Additional Equipment:   Intra-op Plan:   Post-operative Plan: Extubation in OR  Informed Consent: I have reviewed the patients History and Physical, chart, labs and discussed the procedure including the risks, benefits and alternatives for the proposed anesthesia with the patient or authorized representative who has indicated his/her understanding and acceptance.     Plan Discussed with: CRNA and Surgeon  Anesthesia Plan Comments:         Anesthesia Quick Evaluation

## 2018-01-22 NOTE — Transfer of Care (Signed)
Immediate Anesthesia Transfer of Care Note  Patient: Madison Hopkins  Procedure(s) Performed: LEFT BREAST LUMPECTOMY WITH RADIOACTIVE SEED AND COMPLETE LEFT AXILLARY LYMPH NODE DISSECTION (Left Breast)  Patient Location: PACU  Anesthesia Type:GA combined with regional for post-op pain  Level of Consciousness: sedated  Airway & Oxygen Therapy: Patient Spontanous Breathing and Patient connected to face mask oxygen  Post-op Assessment: Report given to RN and Post -op Vital signs reviewed and stable  Post vital signs: Reviewed and stable  Last Vitals:  Vitals Value Taken Time  BP 121/72 01/22/2018 10:24 AM  Temp    Pulse 82 01/22/2018 10:25 AM  Resp 14 01/22/2018 10:25 AM  SpO2 100 % 01/22/2018 10:25 AM  Vitals shown include unvalidated device data.  Last Pain:  Vitals:   01/22/18 0716  TempSrc: Oral  PainSc: 0-No pain         Complications: No apparent anesthesia complications

## 2018-01-22 NOTE — Anesthesia Procedure Notes (Signed)
Procedure Name: LMA Insertion Date/Time: 01/22/2018 8:47 AM Performed by: Willa Frater, CRNA Pre-anesthesia Checklist: Patient identified, Emergency Drugs available, Suction available and Patient being monitored Patient Re-evaluated:Patient Re-evaluated prior to induction Oxygen Delivery Method: Circle system utilized Preoxygenation: Pre-oxygenation with 100% oxygen Induction Type: IV induction Ventilation: Mask ventilation without difficulty LMA: LMA inserted LMA Size: 4.0 Number of attempts: 1 Airway Equipment and Method: Bite block Placement Confirmation: positive ETCO2 Tube secured with: Tape Dental Injury: Teeth and Oropharynx as per pre-operative assessment

## 2018-01-22 NOTE — Anesthesia Postprocedure Evaluation (Signed)
Anesthesia Post Note  Patient: Madison Hopkins  Procedure(s) Performed: LEFT BREAST LUMPECTOMY WITH RADIOACTIVE SEED AND COMPLETE LEFT AXILLARY LYMPH NODE DISSECTION (Left Breast)     Patient location during evaluation: PACU Anesthesia Type: General Level of consciousness: awake and alert Pain management: pain level controlled Vital Signs Assessment: post-procedure vital signs reviewed and stable Respiratory status: spontaneous breathing, nonlabored ventilation, respiratory function stable and patient connected to nasal cannula oxygen Cardiovascular status: blood pressure returned to baseline and stable Postop Assessment: no apparent nausea or vomiting Anesthetic complications: no    Last Vitals:  Vitals:   01/22/18 1130 01/22/18 1145  BP: (!) 143/92 (!) 151/77  Pulse: 78 81  Resp: 10 (!) 7  Temp:    SpO2: 98% 99%    Last Pain:  Vitals:   01/22/18 1145  TempSrc:   PainSc: 5                  Jacelynn Hayton DAVID

## 2018-01-22 NOTE — Interval H&P Note (Signed)
History and Physical Interval Note:  01/22/2018 6:31 AM  Madison Hopkins  has presented today for surgery, with the diagnosis of STAGE 2 BREAST CANCER LEFT  The various methods of treatment have been discussed with the patient and family. After consideration of risks, benefits and other options for treatment, the patient has consented to  Procedure(s): LEFT BREAST LUMPECTOMY WITH RADIOACTIVE SEED AND COMPLETE LEFT AXILLARY LYMPH NODE DISSECTION (Left) as a surgical intervention .  The patient's history has been reviewed, patient examined, no change in status, stable for surgery.  I have reviewed the patient's chart and labs.  Questions were answered to the patient's satisfaction.     Adin Hector

## 2018-01-22 NOTE — Progress Notes (Signed)
Assisted Dr. Ossey with left, ultrasound guided, pectoralis block. Side rails up, monitors on throughout procedure. See vital signs in flow sheet. Tolerated Procedure well. 

## 2018-01-22 NOTE — Op Note (Signed)
Patient Name:           Madison Hopkins   Date of Surgery:        01/22/2018  Pre op Diagnosis:      Stage II cancer left breast                                       Status post neoadjuvant chemotherapy  Post op Diagnosis:    Same  Procedure:                 Left breast lumpectomy with radioactive active seed localization                                      Complete left axillary lymph node dissection  Surgeon:                     Edsel Petrin. Dalbert Batman, M.D., FACS  Assistant:                      Or staff  Operative Indications:    . This is a 62 year old female who has completed her neoadjuvant chemotherapy and presents with her son and husband to discuss and plan definitive surgery for her left breast cancer.Dr. Jana Hakim and Dr. Lisbeth Renshaw are involved in her care. [name], PA is her PCP. Sharen Counter is her gynecologist.      In June she presented with a small palpable mass in the upper outer quadrant of the left breast and a mass in the left axilla. Mammogram showed a 2.2 cm mass of the 2 o'clock position of the left breast and 2 enlarged left lymph nodes left axilla. Biopsy of the left breast mass showed grade 3 invasive ductal carcinoma, ER 30%, PR 0, HER-2 positive. Biopsy of a single left axillary lymph node showed metastatic carcinoma. Subsequent MRI showed a 2.5 cm mass in the upper outer quadrant of the left breast and also an area of enhancement extending anteriorly so the entire area of involvement appeared to be 5 cm. No further biopsies have been done The MRI showed at least 3 enlarged lymph nodes The right breast and axilla looked normal Port-A-Cath was placed on the right side.     She has completed cytotoxic chemotherapy and MRI was performed on December 27, 2017. This shows a complete radiologic response in the left breast. The lymph nodes in the left axilla are still abnormal but they are smaller. The right breast looks normal. I discussed this with her..    We  had a very long discussion. We talked about lumpectomy with radioactive seed localization, complete axillary lymph node dissection, mastectomy with or without reconstruction. We talked about survival issues. We talked about cosmetic issues.  We talked about the fact that she will need Herceptin for a year.  We talked about the fact she will need radiation therapy regardless of the extent of surgery.     Operative Findings:       Left breast lumpectomy was uneventful.  There was no palpable abnormality in the left breast upper outer quadrant.  The specimen mammogram looked good containing the seed and the original biopsy clip in the center of the specimen.  I did not see a mass on the specimen mammogram.  There was some  palpable lymph nodes in the left axilla.  Complete level 1 and level 2 lymph node dissection was performed.  At the completion of the dissection hemostasis was good and the long thoracic nerve and the thoracodorsal nerve were functioning to compression.  A drain was left in the left axilla and she will be observed overnight  Procedure in Detail:          Following the induction of general LMA anesthesia the patient's left breast and chest wall and arm were prepped and draped in a sterile fashion.  Surgical timeout was performed.  Intravenous antibiotics were given.  0.5% Marcaine with epinephrine was used as local infiltration anesthetic.  She had previously undergone left pectoral block.     Using the neoprobe identified the radioactive seed in the upper outer quadrant.  Curvilinear incision was made.  Lumpectomy was performed using the neoprobe and electrocautery.  The specimen was removed and looked good as described above.  The specimen was marked with silk sutures and a 6 color ink kit to orient the pathologist.  Specimen was sent to the lab where the seed was retrieved.  Hemostasis was excellent and achieved with electrocautery.  5 metal clips were placed in the walls of the  lumpectomy cavity.  The lumpectomy cavity was closed in 2 separate layers with interrupted 3-0 Vicryl and the skin closed with a running subcuticular 4-0 Monocryl and Dermabond.     A transverse incision was made in the left axilla at the hairline.  Dissection was carried down through the subcutaneous tissue.  The clavipectoral fascia was incised.  I dissected into the left axilla.  The lateral border of the pectoralis major and pectoralis minor muscles were dissected away and retracted medially.  I took the dissection up until I identified the axillary vein.  I took all the tissues down inferior to the axillary vein.  A couple of venous tributaries were controlled with metal clips and divided.  A large venous tributary was clamped divided and ligated with three 2-0 silk tie.  We dissected and swept all of the lymph nodes out between the chest wall and the thoracodorsal neurovascular bundle.  Some of these were palpably abnormal.  These were sent as a single specimen.  Hemostasis was excellent and achieved with electrocautery and metal clips.  The wound was irrigated.  I placed a small piece of snow hemostatic sponge in the depths of the axilla and it looked good.  A 19 [place] Blake drain was placed and brought out through a separate stab wound inferior lateral sutured to the skin with nylon suture and connected to a suction bulb.  Again hemostasis appeared very good.  The thoracodorsal and long thoracic nerves were functioning to compression    The clavipectoral fascia was closed with interrupted sutures of 3-0 Vicryl and the skin closed with a running subcuticular 4-0 Monocryl and Dermabond.  Clean bandages and a breast binder were placed.  The patient tolerated the procedure well and was taken to PACU in stable condition.  EBL 30 cc.  Counts correct.  Complications none.    Addendum: I logged onto the [employer] and reviewed her prescription medication history     Hill Mackie M. Dalbert Batman, M.D.,  FACS General and Minimally Invasive Surgery Breast and Colorectal Surgery  01/22/2018 10:24 AM

## 2018-01-22 NOTE — Anesthesia Procedure Notes (Addendum)
Anesthesia Regional Block: Pectoralis block   Pre-Anesthetic Checklist: ,, timeout performed, Correct Patient, Correct Site, Correct Laterality, Correct Procedure, Correct Position, site marked, Risks and benefits discussed,  Surgical consent,  Pre-op evaluation,  At surgeon's request and post-op pain management  Laterality: Left  Prep: chloraprep       Needles:  Injection technique: Single-shot     Needle Length: 9cm  Needle Gauge: 21     Additional Needles:   Narrative:  Start time: 01/22/2018 7:45 AM End time: 01/22/2018 7:55 AM Injection made incrementally with aspirations every 5 mL.  Performed by: Personally  Anesthesiologist: Lillia Abed, MD  Additional Notes: Monitors applied. Patient sedated. Sterile prep and drape,hand hygiene and sterile gloves were used. Relevant anatomy identified.Needle position confirmed.Local anesthetic injected incrementally after negative aspiration. Local anesthetic spread visualized. Vascular puncture avoided. No complications. Image printed for medical record.The patient tolerated the procedure well.

## 2018-01-23 ENCOUNTER — Encounter (HOSPITAL_BASED_OUTPATIENT_CLINIC_OR_DEPARTMENT_OTHER): Payer: Self-pay | Admitting: General Surgery

## 2018-01-23 DIAGNOSIS — C50412 Malignant neoplasm of upper-outer quadrant of left female breast: Secondary | ICD-10-CM | POA: Diagnosis not present

## 2018-01-23 NOTE — Discharge Instructions (Signed)
Keep a written record of the drainage and bring that record with you to the office each time Move your shoulder around frequently You may shower, starting on Wednesday       [facility] Phone Number [phone]  BREAST BIOPSY/ PARTIAL MASTECTOMY: POST OP INSTRUCTIONS  Always review your discharge instruction sheet given to you by the facility where your surgery was performed.  IF YOU HAVE DISABILITY OR FAMILY LEAVE FORMS, YOU MUST BRING THEM TO THE OFFICE FOR PROCESSING.  DO NOT GIVE THEM TO YOUR DOCTOR.  1. A prescription for pain medication may be given to you upon discharge.  Take your pain medication as prescribed, if needed.  If narcotic pain medicine is not needed, then you may take acetaminophen (Tylenol) or ibuprofen (Advil) as needed. 2. Take your usually prescribed medications unless otherwise directed 3. If you need a refill on your pain medication, please contact your pharmacy.  They will contact our office to request authorization.  Prescriptions will not be filled after 5pm or on week-ends. 4. You should eat very light the first 24 hours after surgery, such as soup, crackers, pudding, etc.  Resume your normal diet the day after surgery. 5. Most patients will experience some swelling and bruising in the breast.  Ice packs and a good support bra will help.  Swelling and bruising can take several days to resolve.  6. It is common to experience some constipation if taking pain medication after surgery.  Increasing fluid intake and taking a stool softener will usually help or prevent this problem from occurring.  A mild laxative (Milk of Magnesia or Miralax) should be taken according to package directions if there are no bowel movements after 48 hours. 7. Unless discharge instructions indicate otherwise, you may remove your bandages 24-48 hours after surgery, and you may shower at that time.  You may have steri-strips (small skin tapes) in place directly over  the incision.  These strips should be left on the skin for 7-10 days.  If your surgeon used skin glue on the incision, you may shower in 24 hours.  The glue will flake off over the next 2-3 weeks.  Any sutures or staples will be removed at the office during your follow-up visit. 8. ACTIVITIES:  You may resume regular daily activities (gradually increasing) beginning the next day.  Wearing a good support bra or sports bra minimizes pain and swelling.  You may have sexual intercourse when it is comfortable. a. You may drive when you no longer are taking prescription pain medication, you can comfortably wear a seatbelt, and you can safely maneuver your car and apply brakes. b. RETURN TO WORK:  ______________________________________________________________________________________ 9. You should see your doctor in the office for a follow-up appointment approximately two weeks after your surgery.  Your doctors nurse will typically make your follow-up appointment when she calls you with your pathology report.  Expect your pathology report 2-3 business days after your surgery.  You may call to check if you do not hear from [place] after three days. 10. OTHER INSTRUCTIONS: _______________________________________________________________________________________________ _____________________________________________________________________________________________________________________________________ _____________________________________________________________________________________________________________________________________ _____________________________________________________________________________________________________________________________________  WHEN TO CALL YOUR DOCTOR: 1. Fever over 101.0 2. Nausea and/or vomiting. 3. Extreme swelling or bruising. 4. Continued bleeding from incision. 5. Increased pain, redness, or drainage from the incision.  The clinic staff is available to answer your  questions during regular business hours.  Please dont hesitate to call and ask to speak to one of the nurses for clinical concerns.  If you  have a medical emergency, go to the nearest emergency room or call 911.  A surgeon from [facility] is always on call at the hospital.  For further questions, please visit [email or web address]   About my Amaral-Pratt Bulb Drain  What is a Nicoll-Pratt bulb? A Reeg-Pratt is a soft, round device used to collect drainage. It is connected to a long, thin drainage catheter, which is held in place by one or two small stiches near your surgical incision site. When the bulb is squeezed, it forms a vacuum, forcing the drainage to empty into the bulb.  Emptying the Headlee-Pratt bulb- To empty the bulb: 1. Release the plug on the top of the bulb. 2. Pour the bulb's contents into a measuring container which your nurse will provide. 3. Record the time emptied and amount of drainage. Empty the drain(s) as often as your     doctor or nurse recommends.  Date                  Time                    Amount (Drain 1)                 Amount (Drain 2)  _____________________________________________________________________  _____________________________________________________________________  _____________________________________________________________________  _____________________________________________________________________  _____________________________________________________________________  _____________________________________________________________________  _____________________________________________________________________  _____________________________________________________________________  Squeezing the Goddard-Pratt Bulb- To squeeze the bulb: 1. Make sure the plug at the top of the bulb is open. 2. Squeeze the bulb tightly in your fist. You will hear air squeezing from the bulb. 3. Replace the plug while the bulb is  squeezed. 4. Use a safety pin to attach the bulb to your clothing. This will keep the catheter from     pulling at the bulb insertion site.  When to call your doctor- Call your doctor if:  Drain site becomes red, swollen or hot.  You have a fever greater than 101 degrees F.  There is oozing at the drain site.  Drain falls out (apply a guaze bandage over the drain hole and secure it with tape).  Drainage increases daily not related to activity patterns. (You will usually have more drainage when you are active than when you are resting.)  Drainage has a bad odor.

## 2018-01-24 NOTE — Discharge Summary (Signed)
Patient ID: Madison Hopkins [id number] 62 y.o. [birth year]  Admit date: 01/22/2018  Discharge date and time: 01/23/2018  8:58 AM  Admitting Physician: Madison Hopkins  Discharge Physician: Madison Hopkins  Admission Diagnoses: STAGE 2 BREAST CANCER LEFT  Discharge Diagnoses: Stage II left breast cancer                                          History total abdominal hysterectomy and bilateral salpingo-oophorectomy                                         Multiple C-sections                                          History partial colectomy                                          History colostomy reversal    Operations: Procedure(s): LEFT BREAST LUMPECTOMY WITH RADIOACTIVE SEED AND COMPLETE LEFT AXILLARY LYMPH NODE DISSECTION  Admission Condition: good  Discharged Condition: good  Indication for Admission: This is a 62 year old female who has completed her neoadjuvant chemotherapy and presents with her son and husband to discuss and plan definitive surgery for her left breast cancer.Dr. Jana Hopkins and Dr. Lisbeth Hopkins are involved in her care. Madison Delaine, PA is her PCP. Madison Hopkins is her gynecologist. In June she presented with a small palpable mass in the upper outer quadrant of the left breast and a mass in the left axilla. Mammogram showed a 2.2 cm mass of the 2 o'clock position of the left breast and 2 enlarged left lymph nodes left axilla. Biopsy of the left breast mass showed grade 3 invasive ductal carcinoma, ER 30%, PR 0, HER-2 positive. Biopsy of a single left axillary lymph node showed metastatic carcinoma. Subsequent MRI showed a 2.5 cm mass in the upper outer quadrant of the left breast and also an area of enhancement extending anteriorly so the entire area of involvement appeared to be 5 cm. No further biopsies have been done The MRI showed at least 3 enlarged lymph nodes The right breast and axilla looked normal Port-A-Cath was placed on the right  side. She has completed cytotoxic chemotherapy and MRI was performed on December 27, 2017. This shows a complete radiologic response in the left breast. The lymph nodes in the left axilla are still abnormal but they are smaller. The right breast looks normal. I discussed this with her.. We had a very long discussion. We talked about lumpectomy with radioactive seed localization, complete axillary lymph node dissection, mastectomy with or without reconstruction. We talked about survival issues. We talked about cosmetic issues.  We talked about the fact that she will need Herceptin for a year.  We talked about the fact she will need radiation therapy regardless of the extent of surgery.       She will be admitted overnight for observation because of the extent of axillary surgery  Hospital Course: On the day of admission the patient was taken the operating room and underwent left breast  lumpectomy with radioactive seed localization and complete left axillary lymph node dissection.     Operative findings were  Left breast lumpectomy was uneventful.  There was no palpable abnormality in the left breast upper outer quadrant.  The specimen mammogram looked good containing the seed and the original biopsy clip in the center of the specimen.  I did not see a mass on the specimen mammogram.  There was some palpable lymph nodes in the left axilla.  Complete level 1 and level 2 lymph node dissection was performed.  At the completion of the dissection hemostasis was good and the long thoracic nerve and the thoracodorsal nerve were functioning to compression.  A drain was left in the left axilla and she will be observed overnight.       The patient did well overnight.  Pain control seems very good.  She became ambulatory and tolerated diet and wanted to go home on postop day 1.  Exam revealed that the breast wound and the axillary wound looked good.  They were soft without hematoma or unusual bleeding.   JP drain was functioning with thin serosanguineous drainage, low volume.      She was given instruction in diet, activities, and follow-up.  We called in a prescription for hydrocodone for pain if she needs it.  Consults: None  Significant Diagnostic Studies: Surgical pathology  Treatments: surgery: Left breast lumpectomy with radioactive seed localization and complete left axillary lymph node dissection  Disposition: Home  Patient Instructions:  Allergies as of 01/23/2018      Reactions   Penicillins Nausea Only   Has patient had a PCN reaction causing immediate rash, facial/tongue/throat swelling, SOB or lightheadedness with hypotension: No Has patient had a PCN reaction causing severe rash involving mucus membranes or skin necrosis: No Has patient had a PCN reaction that required hospitalization: No Has patient had a PCN reaction occurring within the last 10 years: Yes--nausea & headache ONLY If all of the above answers are "NO", then may proceed with Cephalosporin use.      Medication List    TAKE these medications   acyclovir ointment 5 % Commonly known as:  ZOVIRAX Apply 1 application topically every 4 (four) hours.   cholestyramine 4 GM/DOSE powder Commonly known as:  QUESTRAN Take 1 packet (4 g total) by mouth 2 (two) times daily with a meal.   FLONASE 50 MCG/ACT nasal spray Generic drug:  fluticasone Place 1 spray into both nostrils daily as needed for allergies.   HYDROcodone-acetaminophen 5-325 MG tablet Commonly known as:  NORCO/VICODIN 1/2 to 1 tablets Q 4 hours prn pain What changed:  Another medication with the same name was added. Make sure you understand how and when to take each. Notes to patient:  Do not take this dose per Dr. Dalbert Hopkins   HYDROcodone-acetaminophen 5-325 MG tablet Commonly known as:  NORCO/VICODIN Take 1-2 tablets by mouth every 6 (six) hours as needed for moderate pain or severe pain. What changed:  You were already taking a medication  with the same name, and this prescription was added. Make sure you understand how and when to take each. Notes to patient:  Take this medication at least 6 hours apart from high dose Tylenol (acetaminophen) 1000 mg. Take either/or, but not both! Too much acetaminophen can damage your liver.    hydrocortisone 25 MG suppository Commonly known as:  ANUSOL-HC Place 1 suppository (25 mg total) rectally 2 (two) times daily.   loperamide 2 MG capsule Commonly known  as:  IMODIUM Take 1 capsule (2 mg total) by mouth as needed for diarrhea or loose stools.   multivitamin with minerals Tabs tablet Take 1 tablet by mouth daily after lunch.   naproxen sodium 220 MG tablet Commonly known as:  ALEVE Take 220 mg by mouth 2 (two) times daily as needed (FOR PAIN.).   OPTI-TEARS OP Place 1 drop into both eyes 3 (three) times daily as needed (for dry/irritated eyes.).   OSTEO BI-FLEX ADV TRIPLE ST PO Take 1 tablet by mouth daily after lunch.   valACYclovir 500 MG tablet Commonly known as:  VALTREX Take 1 tablet (500 mg total) by mouth 2 (two) times daily.   VISINE 0.05 % ophthalmic solution Generic drug:  tetrahydrozoline Place 1 drop into both eyes 3 (three) times daily as needed (for dry eyes.).       Activity: Ambulate frequently.  Okay to shower.  No sports or heavy lifting for about 3 weeks Diet: low fat, low cholesterol diet Wound Care: as directed  Follow-up:  With Dr. Dalbert Hopkins in 10 days    Addendum: I logged onto the [facility] website and reviewed her prescription medication history.  Signed: Edsel Petrin. Madison Hopkins, M.D., FACS General and minimally invasive surgery Breast and Colorectal Surgery

## 2018-01-26 ENCOUNTER — Other Ambulatory Visit: Payer: Self-pay | Admitting: *Deleted

## 2018-01-26 DIAGNOSIS — Z170 Estrogen receptor positive status [ER+]: Principal | ICD-10-CM

## 2018-01-26 DIAGNOSIS — C50412 Malignant neoplasm of upper-outer quadrant of left female breast: Secondary | ICD-10-CM

## 2018-01-28 NOTE — Addendum Note (Signed)
Addendum  created 01/28/18 0719 by Lillia Abed, MD   Intraprocedure Blocks edited, Sign clinical note

## 2018-02-01 ENCOUNTER — Telehealth: Payer: Self-pay | Admitting: Oncology

## 2018-02-01 ENCOUNTER — Inpatient Hospital Stay: Payer: 59

## 2018-02-01 ENCOUNTER — Inpatient Hospital Stay (HOSPITAL_BASED_OUTPATIENT_CLINIC_OR_DEPARTMENT_OTHER): Payer: 59 | Admitting: Adult Health

## 2018-02-01 ENCOUNTER — Other Ambulatory Visit: Payer: 59

## 2018-02-01 ENCOUNTER — Inpatient Hospital Stay: Payer: 59 | Attending: Adult Health

## 2018-02-01 ENCOUNTER — Ambulatory Visit: Payer: 59

## 2018-02-01 ENCOUNTER — Ambulatory Visit: Payer: 59 | Admitting: Adult Health

## 2018-02-01 ENCOUNTER — Telehealth: Payer: Self-pay | Admitting: Adult Health

## 2018-02-01 VITALS — BP 135/63 | HR 71 | Temp 98.6°F | Resp 18 | Ht 68.5 in | Wt 190.1 lb

## 2018-02-01 DIAGNOSIS — C50412 Malignant neoplasm of upper-outer quadrant of left female breast: Secondary | ICD-10-CM | POA: Insufficient documentation

## 2018-02-01 DIAGNOSIS — C773 Secondary and unspecified malignant neoplasm of axilla and upper limb lymph nodes: Secondary | ICD-10-CM | POA: Diagnosis not present

## 2018-02-01 DIAGNOSIS — Z95828 Presence of other vascular implants and grafts: Secondary | ICD-10-CM

## 2018-02-01 DIAGNOSIS — Z5112 Encounter for antineoplastic immunotherapy: Secondary | ICD-10-CM | POA: Diagnosis not present

## 2018-02-01 DIAGNOSIS — Z170 Estrogen receptor positive status [ER+]: Secondary | ICD-10-CM

## 2018-02-01 DIAGNOSIS — Z801 Family history of malignant neoplasm of trachea, bronchus and lung: Secondary | ICD-10-CM | POA: Diagnosis not present

## 2018-02-01 LAB — COMPREHENSIVE METABOLIC PANEL
ALT: 17 U/L (ref 0–44)
AST: 16 U/L (ref 15–41)
Albumin: 3.9 g/dL (ref 3.5–5.0)
Alkaline Phosphatase: 127 U/L — ABNORMAL HIGH (ref 38–126)
Anion gap: 8 (ref 5–15)
BUN: 9 mg/dL (ref 8–23)
CO2: 25 mmol/L (ref 22–32)
Calcium: 9.6 mg/dL (ref 8.9–10.3)
Chloride: 108 mmol/L (ref 98–111)
Creatinine, Ser: 0.81 mg/dL (ref 0.44–1.00)
GFR calc Af Amer: >60 mL/min (ref >60)
GFR calc non Af Amer: >60 mL/min (ref >60)
Glucose, Bld: 93 mg/dL (ref 70–99)
Potassium: 3.4 mmol/L — ABNORMAL LOW (ref 3.5–5.1)
Sodium: 141 mmol/L (ref 135–145)
Total Bilirubin: 0.3 mg/dL (ref 0.3–1.2)
Total Protein: 7 g/dL (ref 6.5–8.1)

## 2018-02-01 LAB — CBC WITH DIFFERENTIAL/PLATELET
Abs Immature Granulocytes: 0.01 10*3/uL (ref 0.00–0.07)
Basophils Absolute: 0 10*3/uL (ref 0.0–0.1)
Basophils Relative: 0 %
Eosinophils Absolute: 0.2 10*3/uL (ref 0.0–0.5)
Eosinophils Relative: 3 %
HCT: 32.9 % — ABNORMAL LOW (ref 36.0–46.0)
Hemoglobin: 10 g/dL — ABNORMAL LOW (ref 12.0–15.0)
Immature Granulocytes: 0 %
Lymphocytes Relative: 44 %
Lymphs Abs: 2.6 10*3/uL (ref 0.7–4.0)
MCH: 29.1 pg (ref 26.0–34.0)
MCHC: 30.4 g/dL (ref 30.0–36.0)
MCV: 95.6 fL (ref 80.0–100.0)
Monocytes Absolute: 0.6 10*3/uL (ref 0.1–1.0)
Monocytes Relative: 10 %
Neutro Abs: 2.6 10*3/uL (ref 1.7–7.7)
Neutrophils Relative %: 43 %
Platelets: 312 10*3/uL (ref 150–400)
RBC: 3.44 MIL/uL — ABNORMAL LOW (ref 3.87–5.11)
RDW: 13.2 % (ref 11.5–15.5)
WBC: 6 10*3/uL (ref 4.0–10.5)
nRBC: 0 % (ref 0.0–0.2)

## 2018-02-01 MED ORDER — ACETAMINOPHEN 325 MG PO TABS
650.0000 mg | ORAL_TABLET | Freq: Once | ORAL | Status: AC
  Administered 2018-02-01: 650 mg via ORAL

## 2018-02-01 MED ORDER — HEPARIN SOD (PORK) LOCK FLUSH 100 UNIT/ML IV SOLN
500.0000 [IU] | Freq: Once | INTRAVENOUS | Status: AC | PRN
  Administered 2018-02-01: 500 [IU]
  Filled 2018-02-01: qty 5

## 2018-02-01 MED ORDER — ACETAMINOPHEN 325 MG PO TABS
ORAL_TABLET | ORAL | Status: AC
  Filled 2018-02-01: qty 2

## 2018-02-01 MED ORDER — DIPHENHYDRAMINE HCL 25 MG PO CAPS
25.0000 mg | ORAL_CAPSULE | Freq: Once | ORAL | Status: AC
  Administered 2018-02-01: 25 mg via ORAL

## 2018-02-01 MED ORDER — DIPHENHYDRAMINE HCL 25 MG PO CAPS
ORAL_CAPSULE | ORAL | Status: AC
  Filled 2018-02-01: qty 1

## 2018-02-01 MED ORDER — SODIUM CHLORIDE 0.9% FLUSH
10.0000 mL | Freq: Once | INTRAVENOUS | Status: AC
  Administered 2018-02-01: 10 mL
  Filled 2018-02-01: qty 10

## 2018-02-01 MED ORDER — TRASTUZUMAB CHEMO 150 MG IV SOLR
6.0000 mg/kg | Freq: Once | INTRAVENOUS | Status: AC
  Administered 2018-02-01: 546 mg via INTRAVENOUS
  Filled 2018-02-01: qty 26

## 2018-02-01 MED ORDER — SODIUM CHLORIDE 0.9 % IV SOLN
Freq: Once | INTRAVENOUS | Status: AC
  Administered 2018-02-01: 16:00:00 via INTRAVENOUS
  Filled 2018-02-01: qty 250

## 2018-02-01 MED ORDER — SODIUM CHLORIDE 0.9% FLUSH
10.0000 mL | INTRAVENOUS | Status: DC | PRN
  Administered 2018-02-01: 10 mL
  Filled 2018-02-01: qty 10

## 2018-02-01 NOTE — Telephone Encounter (Signed)
Patient requested afternoon on 12/27

## 2018-02-01 NOTE — Patient Instructions (Signed)
[facility] Discharge Instructions for Patients Receiving Chemotherapy  Today you received the following chemotherapy agents Trastuzumab (HERCEPTIN).  To help prevent nausea and vomiting after your treatment, we encourage you to take your nausea medication as prescribed.   If you develop nausea and vomiting that is not controlled by your nausea medication, call the clinic.   BELOW ARE SYMPTOMS THAT SHOULD BE REPORTED IMMEDIATELY:  *FEVER GREATER THAN 100.5 F  *CHILLS WITH OR WITHOUT FEVER  NAUSEA AND VOMITING THAT IS NOT CONTROLLED WITH YOUR NAUSEA MEDICATION  *UNUSUAL SHORTNESS OF BREATH  *UNUSUAL BRUISING OR BLEEDING  TENDERNESS IN MOUTH AND THROAT WITH OR WITHOUT PRESENCE OF ULCERS  *URINARY PROBLEMS  *BOWEL PROBLEMS  UNUSUAL RASH Items with * indicate a potential emergency and should be followed up as soon as possible.  Feel free to call the clinic should you have any questions or concerns. The clinic phone number is [phone].  Please show the CHEMO ALERT CARD at check-in to the Emergency Department and triage nurse.   

## 2018-02-01 NOTE — Addendum Note (Signed)
Addendum  created 02/01/18 1806 by Lillia Abed, MD   Intraprocedure Blocks edited, Sign clinical note

## 2018-02-01 NOTE — Progress Notes (Addendum)
[place]  [phone] [phone]     ID: GAYLIA KASSEL DOB: [birth year]  MR#: [id number]  HAL#:[id number]  Patient Care Team: Donzetta Kohut as PCP - General (Physician Assistant) [name], [name] Dad, MD as Consulting Physician (Oncology) Kyung Rudd, MD as Consulting Physician (Radiation Oncology) [name], [name] All, MD as Consulting Physician (Obstetrics and Gynecology) Gentry Fitz, MD as Consulting Physician (Family Medicine) [name], MD as Consulting Physician (General Surgery) OTHER MD:  CHIEF COMPLAINT: HER-2 positive breast cancer  CURRENT TREATMENT: adjuvant Trastuzumab, radiation pending  INTERVAL HISTORY: [place] returns today for follow-up of her HER-2 positive, weakly estrogen receptor positive breast cancer accompanied by her friend from [place].  She is doing well today.  Since her last visit she underwent a left breast lumpectomy that showed a residual 0.9cm grade 3 invasive ductal carcinoma, and 1 out of 11 lymph nodes were positive.  She is improving daily since surgery, and is looking forward to having her drain out hopefully next week.    REVIEW OF SYSTEMS: Malaiya is feeling well since completing her surgery on 01/22/2018.  She doe shave some left arm soreness and is taking Advil as needed.  She is doing the exercises Dr. Dalbert Batman gave her to do.  Tasmia wants to know when she can return to work.  She works as a Scientist, water quality at [employer].  She works 8 hours per day, 5 days per week.  She remains slightly fatigued.  Otherwise she is feeling well today and a detailed ROS was non contributory.    HISTORY OF CURRENT ILLNESS: From the original intake note:  MILLA WAHLBERG noted a mass in the left axilla sometime in January or February 2019.  She eventually brought her to her gynecologist's attention, and underwent bilateral diagnostic mammography with tomography and left breast ultrasonography  at [facility] on 08/04/2017 showing: breast density category B. There is a highly suspicious hypoechoic mass in the left breast at the 2 o'clock upper outer quadrant measuring 2.3 x 1.6 x 2.2 cm, located 2 cm from the nipple. Sonographically, there were 2 enlarged lymph nodes in the left axilla, the largest with cortical thickening measuring 2.5 cm. No evidence of malignancy was seen in the right breast.   Accordingly on 08/07/2017 she proceeded to biopsy of the left breast area and 1 of the lymph nodes in question. The pathology from this procedure showed ([id number]): Invasive ductal carcinoma, grade 3. Metastatic carcinoma was found in one left axillary lymph node. Prognostic indicators significant for: estrogen receptor, 30% positive with weak staining intensity and progesterone receptor, 0% negative. Proliferation marker Ki67 at 80%. HER2 amplified with ratios HER2/CEP17 signals 2.32 and average HER2 copies per cell 6.60  The patient's subsequent history is as detailed below.   PAST MEDICAL HISTORY: Past Medical History:  Diagnosis Date  . Abnormal Pap smear of vagina 07/28/2016   LGSIL; colpo 07/2016 atrophic squamous cells; colpo 07/2017 - atypia  . Allergy   . Breast cancer ([place])    Metastatic Left breast  . Elevated hemoglobin A1c 07/28/2016   level - 6.1  . Endometriosis   . Low vitamin D level 07/28/2016   level 24.7  GERD but no ulcers  PAST SURGICAL HISTORY: Past Surgical History:  Procedure Laterality Date  . ABDOMINAL HYSTERECTOMY    . BREAST LUMPECTOMY WITH RADIOACTIVE SEED AND AXILLARY LYMPH NODE DISSECTION Left 01/22/2018   Procedure: LEFT BREAST LUMPECTOMY WITH RADIOACTIVE SEED AND  COMPLETE LEFT AXILLARY LYMPH NODE DISSECTION;  Surgeon: [name], MD;  Location: [facility];  Service: General;  Laterality: Left;  . CESAREAN SECTION    . COLON SURGERY    . OOPHORECTOMY    . PORTACATH PLACEMENT N/A 09/06/2017   Procedure: INSERTION PORT-A-CATH;   Surgeon: [name], MD;  Location: [facility];  Service: General;  Laterality: N/A;  . SMALL INTESTINE SURGERY    . SPINE SURGERY    Hysterectomy with salpingo-oophorectomy, Tonsillectomy, Plantar Fascitis Right Foot Surgery    FAMILY HISTORY Family History  Problem Relation Age of Onset  . Mental illness Mother   . Alcoholism Mother   . Cirrhosis Mother   . Cancer Father   . Lung cancer Father   The patient' father died at age 72 due to lung cancer (heavy smoker). The patient's mother died due to liver cirrhosis (heavy drinker). The patient has 2 brothers and no sisters. There was a paternal 1st cousin with colon cancer diagnosed in the mid 40's, who also had cervical cancer. The mother of this 1st cousin (the patient's paternal aunt) had cancer (the patient's is unsure of what type). There was also a paternal uncle with prostate cancer diagnosed in the 93's. The patient denies a family history of breast or ovarian cancer.     GYNECOLOGIC HISTORY:  No LMP recorded (lmp unknown). Patient has had a hysterectomy. Menarche: 62 years old Age at first live birth: 62 years old She is GXP2.  The patient is status post total hysterectomy with bilateral salpingo-oophorectomy in 1995.  She never used contraception. She notes that she had an estrogen shot one time but no other HRTs.    SOCIAL HISTORY:  The patient works in Therapist, art for the tax department. She is single. At home is herself and no pets. Her son, [name] is age 80 and lives in [place], [place] as a Musician. The patient's daughter [name] is age 17 and lives in [place] in Therapist, art for [employer]. The patient has 5 grandchildren and 1 great grandchild. She attends [employer].     ADVANCED DIRECTIVES: Not in place; at the 08/16/2017 visit the patient was given the appropriate forms to complete on notarized at her discretion   HEALTH MAINTENANCE: Social History   Tobacco Use  . Smoking status: Never Smoker   . Smokeless tobacco: Never Used  Substance Use Topics  . Alcohol use: Not Currently    Alcohol/week: 0.0 standard drinks    Frequency: Never    Comment: occasional  . Drug use: Yes    Types: Marijuana     Colonoscopy: 2009?  PAP: 07/31/2017/ positive for Atypical Squamous cells of uncertain significance.   Bone density: 10/24/2016 showed a T score of -0.7 (normal was (   Allergies  Allergen Reactions  . Penicillins Nausea Only    Has patient had a PCN reaction causing immediate rash, facial/tongue/throat swelling, SOB or lightheadedness with hypotension: No Has patient had a PCN reaction causing severe rash involving mucus membranes or skin necrosis: No Has patient had a PCN reaction that required hospitalization: No Has patient had a PCN reaction occurring within the last 10 years: Yes--nausea & headache ONLY If all of the above answers are "NO", then may proceed with Cephalosporin use.     Current Outpatient Medications  Medication Sig Dispense Refill  . acyclovir ointment (ZOVIRAX) 5 % Apply 1 application topically every 4 (four) hours. 15 g 2  . Artificial Tear  Solution (OPTI-TEARS OP) Place 1 drop into both eyes 3 (three) times daily as needed (for dry/irritated eyes.).    Marland Kitchen cholestyramine (QUESTRAN) 4 GM/DOSE powder Take 1 packet (4 g total) by mouth 2 (two) times daily with a meal. 378 g 2  . fluticasone (FLONASE) 50 MCG/ACT nasal spray Place 1 spray into both nostrils daily as needed for allergies.    Marland Kitchen HYDROcodone-acetaminophen (NORCO) 5-325 MG tablet 1/2 to 1 tablets Q 4 hours prn pain 30 tablet 0  . HYDROcodone-acetaminophen (NORCO) 5-325 MG tablet Take 1-2 tablets by mouth every 6 (six) hours as needed for moderate pain or severe pain. 30 tablet 0  . hydrocortisone (ANUSOL-HC) 25 MG suppository Place 1 suppository (25 mg total) rectally 2 (two) times daily. 12 suppository 0  . loperamide (IMODIUM) 2 MG capsule Take 1 capsule (2 mg total) by mouth as needed for diarrhea  or loose stools. 30 capsule 0  . LORazepam (ATIVAN) 1 MG tablet Take 1 mg by mouth as needed for anxiety.    . Misc Natural Products (OSTEO BI-FLEX ADV TRIPLE ST PO) Take 1 tablet by mouth daily after lunch.    . Multiple Vitamin (MULTIVITAMIN WITH MINERALS) TABS tablet Take 1 tablet by mouth daily after lunch.    . naproxen sodium (ALEVE) 220 MG tablet Take 220 mg by mouth 2 (two) times daily as needed (FOR PAIN.).     Marland Kitchen tetrahydrozoline (VISINE) 0.05 % ophthalmic solution Place 1 drop into both eyes 3 (three) times daily as needed (for dry eyes.).    Marland Kitchen valACYclovir (VALTREX) 500 MG tablet Take 1 tablet (500 mg total) by mouth 2 (two) times daily. 60 tablet 2   No current facility-administered medications for this visit.     OBJECTIVE:  Vitals:   02/01/18 1400  BP: 135/63  Pulse: 71  Resp: 18  Temp: 98.6 F (37 C)  SpO2: 99%     Body mass index is 28.48 kg/m.   Wt Readings from Last 3 Encounters:  02/01/18 190 lb 1.6 oz (86.2 kg)  01/22/18 186 lb 1.1 oz (84.4 kg)  01/03/18 198 lb 9.6 oz (90.1 kg)  ECOG FS:1 - Symptomatic but completely ambulatory GENERAL: Patient is a well appearing female in no acute distress HEENT:  Sclerae anicteric.  Oropharynx clear and moist. No ulcerations or evidence of oropharyngeal candidiasis. Neck is supple.  NODES:  No cervical, supraclavicular, or axillary lymphadenopathy palpated.  BREAST EXAM:  S/p left lumpectomy, covered by ABD pad, site is healing well.  No sign of infection LUNGS:  Clear to auscultation bilaterally.  No wheezes or rhonchi. HEART:  Regular rate and rhythm. No murmur appreciated. ABDOMEN:  Soft, nontender.  Positive, normoactive bowel sounds. No organomegaly palpated. MSK:  No focal spinal tenderness to palpation. Full range of motion bilaterally in the upper extremities. EXTREMITIES:  No peripheral edema.   SKIN:  Clear with no obvious rashes or skin changes. No nail dyscrasia. NEURO:  Nonfocal. Well oriented.  Appropriate  affect.    LAB RESULTS:  CMP     Component Value Date/Time   NA 142 01/11/2018 1331   K 3.4 (L) 01/11/2018 1331   CL 108 01/11/2018 1331   CO2 25 01/11/2018 1331   GLUCOSE 106 (H) 01/11/2018 1331   BUN 4 (L) 01/11/2018 1331   CREATININE 0.81 01/11/2018 1331   CREATININE 0.81 10/25/2017 1055   CALCIUM 9.6 01/11/2018 1331   PROT 7.0 01/11/2018 1331   ALBUMIN 4.1 01/11/2018 1331   AST  19 01/11/2018 1331   AST 23 10/25/2017 1055   ALT 23 01/11/2018 1331   ALT 30 10/25/2017 1055   ALKPHOS 130 (H) 01/11/2018 1331   BILITOT 0.5 01/11/2018 1331   BILITOT 0.6 10/25/2017 1055   GFRNONAA >60 01/11/2018 1331   GFRNONAA >60 10/25/2017 1055   GFRAA >60 01/11/2018 1331   GFRAA >60 10/25/2017 1055    No results found for: TOTALPROTELP, ALBUMINELP, A1GS, A2GS, BETS, BETA2SER, GAMS, MSPIKE, SPEI  No results found for: KPAFRELGTCHN, LAMBDASER, KAPLAMBRATIO  Lab Results  Component Value Date   WBC 6.0 02/01/2018   NEUTROABS 2.6 02/01/2018   HGB 10.0 (L) 02/01/2018   HCT 32.9 (L) 02/01/2018   MCV 95.6 02/01/2018   PLT 312 02/01/2018    '@LASTCHEMISTRY'$ @  No results found for: LABCA2  No components found for: RCVELF810  No results for input(s): INR in the last 168 hours.  No results found for: LABCA2  No results found for: FBP102  No results found for: HEN277  No results found for: OEU235  No results found for: CA2729  No components found for: HGQUANT  No results found for: CEA1 / No results found for: CEA1   No results found for: AFPTUMOR  No results found for: CHROMOGRNA  No results found for: PSA1  Appointment on 02/01/2018  Component Date Value Ref Range Status  . WBC 02/01/2018 6.0  4.0 - 10.5 K/uL Final  . RBC 02/01/2018 3.44* 3.87 - 5.11 MIL/uL Final  . Hemoglobin 02/01/2018 10.0* 12.0 - 15.0 g/dL Final  . HCT 02/01/2018 32.9* 36.0 - 46.0 % Final  . MCV 02/01/2018 95.6  80.0 - 100.0 fL Final  . MCH 02/01/2018 29.1  26.0 - 34.0 pg Final  . MCHC  02/01/2018 30.4  30.0 - 36.0 g/dL Final  . RDW 02/01/2018 13.2  11.5 - 15.5 % Final  . Platelets 02/01/2018 312  150 - 400 K/uL Final  . nRBC 02/01/2018 0.0  0.0 - 0.2 % Final  . Neutrophils Relative % 02/01/2018 43  % Final  . Neutro Abs 02/01/2018 2.6  1.7 - 7.7 K/uL Final  . Lymphocytes Relative 02/01/2018 44  % Final  . Lymphs Abs 02/01/2018 2.6  0.7 - 4.0 K/uL Final  . Monocytes Relative 02/01/2018 10  % Final  . Monocytes Absolute 02/01/2018 0.6  0.1 - 1.0 K/uL Final  . Eosinophils Relative 02/01/2018 3  % Final  . Eosinophils Absolute 02/01/2018 0.2  0.0 - 0.5 K/uL Final  . Basophils Relative 02/01/2018 0  % Final  . Basophils Absolute 02/01/2018 0.0  0.0 - 0.1 K/uL Final  . Immature Granulocytes 02/01/2018 0  % Final  . Abs Immature Granulocytes 02/01/2018 0.01  0.00 - 0.07 K/uL Final   Performed at [facility], [address] [place], [place] [address]    (this displays the last labs from the last 3 days)  No results found for: TOTALPROTELP, ALBUMINELP, A1GS, A2GS, BETS, BETA2SER, GAMS, MSPIKE, SPEI (this displays SPEP labs)  No results found for: KPAFRELGTCHN, LAMBDASER, KAPLAMBRATIO (kappa/lambda light chains)  No results found for: HGBA, HGBA2QUANT, HGBFQUANT, HGBSQUAN (Hemoglobinopathy evaluation)   No results found for: LDH  No results found for: IRON, TIBC, IRONPCTSAT (Iron and TIBC)  No results found for: FERRITIN  Urinalysis    Component Value Date/Time   BILIRUBINUR negative 10/07/2017 0939   BILIRUBINUR n 08/25/2016 1407   KETONESUR negative 10/07/2017 [place] =30 (A) 10/07/2017 [place] n 08/25/2016 1407  UROBILINOGEN 1.0 10/07/2017 0939   NITRITE Positive (A) 10/07/2017 0939   NITRITE n 08/25/2016 1407   LEUKOCYTESUR Negative 10/07/2017 0939     STUDIES: Mm Breast Surgical Specimen  Result Date: 01/22/2018 CLINICAL DATA:  Specimen radiograph status post left breast lumpectomy. EXAM: SPECIMEN  RADIOGRAPH OF THE LEFT BREAST COMPARISON:  Previous exam(s). FINDINGS: Status post excision of the left breast. The radioactive seed and biopsy marker clip are present, completely intact, and were marked for pathology. These findings were communicated with the OR at 9:20 a.m. IMPRESSION: Specimen radiograph of the left breast. Electronically Signed   By: Ammie Ferrier M.D.   On: 01/22/2018 09:21   Mm Lt Radioactive Seed Loc Mammo Guide  Result Date: 01/19/2018 CLINICAL DATA:  Preoperative radioactive seed localization of left breast o'clock mass. EXAM: MAMMOGRAPHIC GUIDED RADIOACTIVE SEED LOCALIZATION OF THE LEFT BREAST COMPARISON:  Previous exam(s). FINDINGS: Patient presents for radioactive seed localization prior to left breast lumpectomy. I met with the patient and we discussed the procedure of seed localization including benefits and alternatives. We discussed the high likelihood of a successful procedure. We discussed the risks of the procedure including infection, bleeding, tissue injury and further surgery. We discussed the low dose of radioactivity involved in the procedure. Informed, written consent was given. The usual time-out protocol was performed immediately prior to the procedure. Using mammographic guidance, sterile technique, 1% lidocaine and an I-125 radioactive seed, ribbon shaped marker was localized using a superior approach. The follow-up mammogram images confirm the seed in the expected location and were marked for Dr. Dalbert Batman. Follow-up survey of the patient confirms presence of the radioactive seed. Order number of I-125 seed:  [id number]. Total activity:  0.175 millicurie reference Date: December 12, 2017 The patient tolerated the procedure well and was released from the [facility]. She was given instructions regarding seed removal. IMPRESSION: Radioactive seed localization left breast. No apparent complications. Electronically Signed   By: Fidela Salisbury M.D.   On:  01/19/2018 15:10    ELIGIBLE FOR AVAILABLE RESEARCH PROTOCOL: yes (see Research Studies)  ASSESSMENT: 62 y.o. [place], [place] woman status post left breast upper outer quadrant and left axillary lymph node biopsy 08/07/2017, both positive for a T2 N1, stage IIB invasive ductal carcinoma, grade 3, estrogen receptor weakly positive, progesterone receptor negative, but HER-2 amplified, with an MIB-1 of 80%  (a) staging chest CT and bone scan 08/30/2017 showed no evidence of metastatic disease  (1) neoadjuvant chemotherapy will consist of carboplatin, docetaxel, trastuzumab, and Pertuzumab given every 21 days x 6 starting 09/07/2017, perjeta omitted with cycle 2 and 3 due to diarrhea  (a) Gemcitabine substituted for Docetaxel beginning with cycle 5 and 6 for concerns of neuropathy  (2) trastuzumab will be continued to complete 6 months  (a) echocardiogram 08/28/2017 showed an ejection fraction in the 60-65% range.  (b) echocardiogram on 11/29/2017 showed an EF of 55-60%  (3) Left lumpectomy on 01/22/2018 shows a ypT1b pN1a, grade 3 residual invasive ductal carcinoma, agnostic panel HER-2 negative, ER 40% weakly positive, PR negative.   (a) foundation one testing requested on 02/02/2018  (b) total of 11 axillary lymph nodes removed (1+)  (4) adjuvant radiation pending  (5) will start antiestrogens at the completion of local therapy  PLAN:  Kimala is doing well today. Her labs are stable and I reviewed those with her in detail.  Her lumpectomy site is healing well.  She met with Dr. Jana Hakim and we reviewed her pathology results in detail.  She  will continue on adjuvant Trastuzumab.  A referral to Dr. Lisbeth Renshaw has been placed, and [place] will get that scheduled today.  We will send Darian's pathology results for foundation one testing and that request has been made.    Masyn will return every three weeks for labs and Trastuzumab, she will see Dr. Jana Hakim in February after radiation and with the  Trastuzumab that is due that day.  She knows to call between now and her next appointment for any questions or concerns.  Wilber Bihari, NP  02/01/18 2:39 PM Medical Oncology and Hematology [facility] [address] [place], [place] [address] Tel. [phone]    Fax. [phone]   ADDENDUM: I met with Emmi to review her pathology.  Unfortunately there was residual disease both in the breast and in 1 lymph node.  This indicates a significant risk of relapse.  I would like to intensify her treatment but she does not qualify for our pembrolizumab study or for the [place] study.  Certainly we will continue antiestrogens to a total of 7-10 years.  Also we will request a foundation 1 and PD-L1 testing on this material.  If positive we will consider adding pembrolizumab for 2 years.  I personally saw this patient and performed a substantive portion of this encounter with the listed APP documented above.   Chauncey Cruel, MD Medical Oncology and Hematology [facility] [address] [place], [place] [address] Tel. [phone]    Fax. [phone]

## 2018-02-01 NOTE — Telephone Encounter (Signed)
Gave avs and calendar ° °

## 2018-02-02 ENCOUNTER — Encounter: Payer: Self-pay | Admitting: Adult Health

## 2018-02-02 ENCOUNTER — Ambulatory Visit: Payer: Self-pay

## 2018-02-02 ENCOUNTER — Ambulatory Visit: Payer: Self-pay | Admitting: Adult Health

## 2018-02-02 ENCOUNTER — Other Ambulatory Visit: Payer: 59

## 2018-02-02 ENCOUNTER — Other Ambulatory Visit: Payer: Self-pay

## 2018-02-08 ENCOUNTER — Other Ambulatory Visit: Payer: Self-pay

## 2018-02-08 ENCOUNTER — Other Ambulatory Visit: Payer: Self-pay | Admitting: Oncology

## 2018-02-08 ENCOUNTER — Encounter (HOSPITAL_COMMUNITY): Payer: Self-pay | Admitting: Oncology

## 2018-02-08 ENCOUNTER — Ambulatory Visit: Payer: 59 | Attending: General Surgery | Admitting: Rehabilitation

## 2018-02-08 ENCOUNTER — Encounter: Payer: Self-pay | Admitting: Rehabilitation

## 2018-02-08 DIAGNOSIS — Z170 Estrogen receptor positive status [ER+]: Secondary | ICD-10-CM | POA: Insufficient documentation

## 2018-02-08 DIAGNOSIS — M25612 Stiffness of left shoulder, not elsewhere classified: Secondary | ICD-10-CM | POA: Insufficient documentation

## 2018-02-08 DIAGNOSIS — C50412 Malignant neoplasm of upper-outer quadrant of left female breast: Secondary | ICD-10-CM | POA: Diagnosis present

## 2018-02-08 DIAGNOSIS — R209 Unspecified disturbances of skin sensation: Secondary | ICD-10-CM | POA: Diagnosis present

## 2018-02-08 DIAGNOSIS — Z483 Aftercare following surgery for neoplasm: Secondary | ICD-10-CM

## 2018-02-08 NOTE — Patient Instructions (Signed)
Post op stretches and standing cane  Flexion (Eccentric) - Active-Assist (Cane)          Cancer Rehab [phone]    Use unaffected arm to push affected arm forward. Avoid hiking shoulder (shoulder should NOT touch cheek). Keep palm relaxed. Slowly lower affected arm. Hold stretch for _5_ seconds repeating _5-10_ times, _1-2_ times a day.  Abduction (Eccentric) - Active-Assist (Cane)    Use unaffected arm to push affected arm out to side. Avoid hiking shoulder (shoulder should NOT touch cheek). Keep palm relaxed. Slowly lower affected arm. Hold stretch _5_ seconds repeating _5-10_ times, _1-2_ times a day.  Cane Exercise: Extension   Stand holding cane behind back with both hands palm-up. Lift the cane away from body until gentle stretch felt. Do NOT lean forward.  Hold __5__ seconds. Repeat _5-10___ times. Do _1-2___ sessions per day.

## 2018-02-08 NOTE — Progress Notes (Signed)
[place]  [phone] Fax:([phone]) [phone]     ID: Madison Hopkins DOB: 1955/12/21  MR#: [id number]  KWI#:[id number]  Patient Care Team: Donzetta Kohut as PCP - General (Physician Assistant) Madison Hopkins, [name] Dad, MD as Consulting Physician (Oncology) Kyung Rudd, MD as Consulting Physician (Radiation Oncology) [name], [name] All, MD as Consulting Physician (Obstetrics and Gynecology) Gentry Fitz, MD as Consulting Physician (Family Medicine) [name], MD as Consulting Physician (General Surgery) OTHER MD:  CHIEF COMPLAINT: HER-2 positive breast cancer  CURRENT TREATMENT: adjuvant Trastuzumab, radiation pending  INTERVAL HISTORY: Madison Hopkins returns today for follow-up of her HER-2 positive, weakly estrogen receptor positive breast cancer accompanied by her friend from [place].  She is doing well today.  Since her last visit she underwent a left breast lumpectomy that showed a residual 0.9cm grade 3 invasive ductal carcinoma, and 1 out of 11 lymph nodes were positive.  She is improving daily since surgery, and is looking forward to having her drain out hopefully next week.    REVIEW OF SYSTEMS: Madison Hopkins is feeling well since completing her surgery on 01/22/2018.  She doe shave some left arm soreness and is taking Advil as needed.  She is doing the exercises Dr. Dalbert Batman gave her to do.  Madison Hopkins wants to know when she can return to work.  She works as a Scientist, water quality at [employer].  She works 8 hours per day, 5 days per week.  She remains slightly fatigued.  Otherwise she is feeling well today and a detailed ROS was non contributory.    HISTORY OF CURRENT ILLNESS: From the original intake note:  Madison Hopkins noted a mass in the left axilla sometime in January or February 2019.  She eventually brought her to her gynecologist's attention, and underwent bilateral diagnostic mammography with tomography and left breast ultrasonography  at [facility] on 08/04/2017 showing: breast density category B. There is a highly suspicious hypoechoic mass in the left breast at the 2 o'clock upper outer quadrant measuring 2.3 x 1.6 x 2.2 cm, located 2 cm from the nipple. Sonographically, there were 2 enlarged lymph nodes in the left axilla, the largest with cortical thickening measuring 2.5 cm. No evidence of malignancy was seen in the right breast.   Accordingly on 08/07/2017 she proceeded to biopsy of the left breast area and 1 of the lymph nodes in question. The pathology from this procedure showed ([id number]): Invasive ductal carcinoma, grade 3. Metastatic carcinoma was found in one left axillary lymph node. Prognostic indicators significant for: estrogen receptor, 30% positive with weak staining intensity and progesterone receptor, 0% negative. Proliferation marker Ki67 at 80%. HER2 amplified with ratios HER2/CEP17 signals 2.32 and average HER2 copies per cell 6.60  The patient's subsequent history is as detailed below.   PAST MEDICAL HISTORY: Past Medical History:  Diagnosis Date  . Abnormal Pap smear of vagina 07/28/2016   LGSIL; colpo 07/2016 atrophic squamous cells; colpo 07/2017 - atypia  . Allergy   . Breast cancer ([facility])    Metastatic Left breast  . Elevated hemoglobin A1c 07/28/2016   level - 6.1  . Endometriosis   . Low vitamin D level 07/28/2016   level 24.7  GERD but no ulcers  PAST SURGICAL HISTORY: Past Surgical History:  Procedure Laterality Date  . ABDOMINAL HYSTERECTOMY    . BREAST LUMPECTOMY WITH RADIOACTIVE SEED AND AXILLARY LYMPH NODE DISSECTION Left 01/22/2018   Procedure: LEFT BREAST LUMPECTOMY WITH RADIOACTIVE SEED AND  COMPLETE LEFT AXILLARY LYMPH NODE DISSECTION;  Surgeon: [name], MD;  Location: [facility];  Service: General;  Laterality: Left;  . CESAREAN SECTION    . COLON SURGERY    . OOPHORECTOMY    . PORTACATH PLACEMENT N/A 09/06/2017   Procedure: INSERTION PORT-A-CATH;   Surgeon: [name], MD;  Location: [facility];  Service: General;  Laterality: N/A;  . SMALL INTESTINE SURGERY    . SPINE SURGERY    Hysterectomy with salpingo-oophorectomy, Tonsillectomy, Plantar Fascitis Right Foot Surgery    FAMILY HISTORY Family History  Problem Relation Age of Onset  . Mental illness Mother   . Alcoholism Mother   . Cirrhosis Mother   . Cancer Father   . Lung cancer Father   The patient' father died at age 14 due to lung cancer (heavy smoker). The patient's mother died due to liver cirrhosis (heavy drinker). The patient has 2 brothers and no sisters. There was a paternal 1st cousin with colon cancer diagnosed in the mid 40's, who also had cervical cancer. The mother of this 1st cousin (the patient's paternal aunt) had cancer (the patient's is unsure of what type). There was also a paternal uncle with prostate cancer diagnosed in the 52's. The patient denies a family history of breast or ovarian cancer.     GYNECOLOGIC HISTORY:  No LMP recorded (lmp unknown). Patient has had a hysterectomy. Menarche: 62 years old Age at first live birth: 62 years old She is GXP2.  The patient is status post total hysterectomy with bilateral salpingo-oophorectomy in 1995.  She never used contraception. She notes that she had an estrogen shot one time but no other HRTs.    SOCIAL HISTORY:  The patient works in Therapist, art for the tax department. She is single. At home is herself and no pets. Her son, Madison Hopkins is age 36 and lives in [place], [place] as a Musician. The patient's daughter Madison Hopkins is age 51 and lives in [place] in Therapist, art for [employer]. The patient has 5 grandchildren and 1 great grandchild. She attends [employer].     ADVANCED DIRECTIVES: Not in place; at the 08/16/2017 visit the patient was given the appropriate forms to complete on notarized at her discretion   HEALTH MAINTENANCE: Social History   Tobacco Use  . Smoking status: Never Smoker   . Smokeless tobacco: Never Used  Substance Use Topics  . Alcohol use: Not Currently    Alcohol/week: 0.0 standard drinks    Frequency: Never    Comment: occasional  . Drug use: Yes    Types: Marijuana     Colonoscopy: 2009?  PAP: 07/31/2017/ positive for Atypical Squamous cells of uncertain significance.   Bone density: 10/24/2016 showed a T score of -0.7 (normal was (   Allergies  Allergen Reactions  . Penicillins Nausea Only    Has patient had a PCN reaction causing immediate rash, facial/tongue/throat swelling, SOB or lightheadedness with hypotension: No Has patient had a PCN reaction causing severe rash involving mucus membranes or skin necrosis: No Has patient had a PCN reaction that required hospitalization: No Has patient had a PCN reaction occurring within the last 10 years: Yes--nausea & headache ONLY If all of the above answers are "NO", then may proceed with Cephalosporin use.     Current Outpatient Medications  Medication Sig Dispense Refill  . acyclovir ointment (ZOVIRAX) 5 % Apply 1 application topically every 4 (four) hours. 15 g 2  . Artificial Tear  Solution (OPTI-TEARS OP) Place 1 drop into both eyes 3 (three) times daily as needed (for dry/irritated eyes.).    Marland Kitchen cholestyramine (QUESTRAN) 4 GM/DOSE powder Take 1 packet (4 g total) by mouth 2 (two) times daily with a meal. 378 g 2  . fluticasone (FLONASE) 50 MCG/ACT nasal spray Place 1 spray into both nostrils daily as needed for allergies.    Marland Kitchen HYDROcodone-acetaminophen (NORCO) 5-325 MG tablet 1/2 to 1 tablets Q 4 hours prn pain 30 tablet 0  . HYDROcodone-acetaminophen (NORCO) 5-325 MG tablet Take 1-2 tablets by mouth every 6 (six) hours as needed for moderate pain or severe pain. 30 tablet 0  . hydrocortisone (ANUSOL-HC) 25 MG suppository Place 1 suppository (25 mg total) rectally 2 (two) times daily. 12 suppository 0  . loperamide (IMODIUM) 2 MG capsule Take 1 capsule (2 mg total) by mouth as needed for diarrhea  or loose stools. 30 capsule 0  . LORazepam (ATIVAN) 1 MG tablet Take 1 mg by mouth as needed for anxiety.    . Misc Natural Products (OSTEO BI-FLEX ADV TRIPLE ST PO) Take 1 tablet by mouth daily after lunch.    . Multiple Vitamin (MULTIVITAMIN WITH MINERALS) TABS tablet Take 1 tablet by mouth daily after lunch.    . naproxen sodium (ALEVE) 220 MG tablet Take 220 mg by mouth 2 (two) times daily as needed (FOR PAIN.).     Marland Kitchen tetrahydrozoline (VISINE) 0.05 % ophthalmic solution Place 1 drop into both eyes 3 (three) times daily as needed (for dry eyes.).    Marland Kitchen valACYclovir (VALTREX) 500 MG tablet Take 1 tablet (500 mg total) by mouth 2 (two) times daily. 60 tablet 2   No current facility-administered medications for this visit.     OBJECTIVE:  There were no vitals filed for this visit.   There is no height or weight on file to calculate BMI.   Wt Readings from Last 3 Encounters:  02/01/18 190 lb 1.6 oz (86.2 kg)  01/22/18 186 lb 1.1 oz (84.4 kg)  01/03/18 198 lb 9.6 oz (90.1 kg)  ECOG FS:1 - Symptomatic but completely ambulatory GENERAL: Patient is a well appearing female in no acute distress HEENT:  Sclerae anicteric.  Oropharynx clear and moist. No ulcerations or evidence of oropharyngeal candidiasis. Neck is supple.  NODES:  No cervical, supraclavicular, or axillary lymphadenopathy palpated.  BREAST EXAM:  S/p left lumpectomy, covered by ABD pad, site is healing well.  No sign of infection LUNGS:  Clear to auscultation bilaterally.  No wheezes or rhonchi. HEART:  Regular rate and rhythm. No murmur appreciated. ABDOMEN:  Soft, nontender.  Positive, normoactive bowel sounds. No organomegaly palpated. MSK:  No focal spinal tenderness to palpation. Full range of motion bilaterally in the upper extremities. EXTREMITIES:  No peripheral edema.   SKIN:  Clear with no obvious rashes or skin changes. No nail dyscrasia. NEURO:  Nonfocal. Well oriented.  Appropriate affect.    LAB  RESULTS:  CMP     Component Value Date/Time   NA 141 02/01/2018 1333   K 3.4 (L) 02/01/2018 1333   CL 108 02/01/2018 1333   CO2 25 02/01/2018 1333   GLUCOSE 93 02/01/2018 1333   BUN 9 02/01/2018 1333   CREATININE 0.81 02/01/2018 1333   CREATININE 0.81 10/25/2017 1055   CALCIUM 9.6 02/01/2018 1333   PROT 7.0 02/01/2018 1333   ALBUMIN 3.9 02/01/2018 1333   AST 16 02/01/2018 1333   AST 23 10/25/2017 1055   ALT 17 02/01/2018  1333   ALT 30 10/25/2017 1055   ALKPHOS 127 (H) 02/01/2018 1333   BILITOT 0.3 02/01/2018 1333   BILITOT 0.6 10/25/2017 1055   GFRNONAA >60 02/01/2018 1333   GFRNONAA >60 10/25/2017 1055   GFRAA >60 02/01/2018 1333   GFRAA >60 10/25/2017 1055    No results found for: TOTALPROTELP, ALBUMINELP, A1GS, A2GS, BETS, BETA2SER, GAMS, MSPIKE, SPEI  No results found for: KPAFRELGTCHN, LAMBDASER, KAPLAMBRATIO  Lab Results  Component Value Date   WBC 6.0 02/01/2018   NEUTROABS 2.6 02/01/2018   HGB 10.0 (L) 02/01/2018   HCT 32.9 (L) 02/01/2018   MCV 95.6 02/01/2018   PLT 312 02/01/2018    '@LASTCHEMISTRY'$ @  No results found for: LABCA2  No components found for: MHDQQI297  No results for input(s): INR in the last 168 hours.  No results found for: LABCA2  No results found for: LGX211  No results found for: HER740  No results found for: CXK481  No results found for: CA2729  No components found for: HGQUANT  No results found for: CEA1 / No results found for: CEA1   No results found for: AFPTUMOR  No results found for: CHROMOGRNA  No results found for: PSA1  No visits with results within 3 Day(s) from this visit.  Latest known visit with results is:  Appointment on 02/01/2018  Component Date Value Ref Range Status  . Sodium 02/01/2018 141  135 - 145 mmol/L Final  . Potassium 02/01/2018 3.4* 3.5 - 5.1 mmol/L Final  . Chloride 02/01/2018 108  98 - 111 mmol/L Final  . CO2 02/01/2018 25  22 - 32 mmol/L Final  . Glucose, Bld 02/01/2018 93  70 - 99  mg/dL Final  . BUN 02/01/2018 9  8 - 23 mg/dL Final  . Creatinine, Ser 02/01/2018 0.81  0.44 - 1.00 mg/dL Final  . Calcium 02/01/2018 9.6  8.9 - 10.3 mg/dL Final  . Total Protein 02/01/2018 7.0  6.5 - 8.1 g/dL Final  . Albumin 02/01/2018 3.9  3.5 - 5.0 g/dL Final  . AST 02/01/2018 16  15 - 41 U/L Final  . ALT 02/01/2018 17  0 - 44 U/L Final  . Alkaline Phosphatase 02/01/2018 127* 38 - 126 U/L Final  . Total Bilirubin 02/01/2018 0.3  0.3 - 1.2 mg/dL Final  . GFR calc non Af Amer 02/01/2018 >60  >60 mL/min Final  . GFR calc Af Amer 02/01/2018 >60  >60 mL/min Final  . Anion gap 02/01/2018 8  5 - 15 Final   Performed at [facility], [place] [address], [place], [place] [address]  . WBC 02/01/2018 6.0  4.0 - 10.5 K/uL Final  . RBC 02/01/2018 3.44* 3.87 - 5.11 MIL/uL Final  . Hemoglobin 02/01/2018 10.0* 12.0 - 15.0 g/dL Final  . HCT 02/01/2018 32.9* 36.0 - 46.0 % Final  . MCV 02/01/2018 95.6  80.0 - 100.0 fL Final  . MCH 02/01/2018 29.1  26.0 - 34.0 pg Final  . MCHC 02/01/2018 30.4  30.0 - 36.0 g/dL Final  . RDW 02/01/2018 13.2  11.5 - 15.5 % Final  . Platelets 02/01/2018 312  150 - 400 K/uL Final  . nRBC 02/01/2018 0.0  0.0 - 0.2 % Final  . Neutrophils Relative % 02/01/2018 43  % Final  . Neutro Abs 02/01/2018 2.6  1.7 - 7.7 K/uL Final  . Lymphocytes Relative 02/01/2018 44  % Final  . Lymphs Abs 02/01/2018 2.6  0.7 - 4.0 K/uL Final  . Monocytes Relative  02/01/2018 10  % Final  . Monocytes Absolute 02/01/2018 0.6  0.1 - 1.0 K/uL Final  . Eosinophils Relative 02/01/2018 3  % Final  . Eosinophils Absolute 02/01/2018 0.2  0.0 - 0.5 K/uL Final  . Basophils Relative 02/01/2018 0  % Final  . Basophils Absolute 02/01/2018 0.0  0.0 - 0.1 K/uL Final  . Immature Granulocytes 02/01/2018 0  % Final  . Abs Immature Granulocytes 02/01/2018 0.01  0.00 - 0.07 K/uL Final   Performed at [facility], [address] [place], [place] [address]    (this  displays the last labs from the last 3 days)  No results found for: TOTALPROTELP, ALBUMINELP, A1GS, A2GS, BETS, BETA2SER, GAMS, MSPIKE, SPEI (this displays SPEP labs)  No results found for: KPAFRELGTCHN, LAMBDASER, KAPLAMBRATIO (kappa/lambda light chains)  No results found for: HGBA, HGBA2QUANT, HGBFQUANT, HGBSQUAN (Hemoglobinopathy evaluation)   No results found for: LDH  No results found for: IRON, TIBC, IRONPCTSAT (Iron and TIBC)  No results found for: FERRITIN  Urinalysis    Component Value Date/Time   BILIRUBINUR negative 10/07/2017 0939   BILIRUBINUR n 08/25/2016 1407   KETONESUR negative 10/07/2017 0939   PROTEINUR =30 (A) 10/07/2017 0939   PROTEINUR n 08/25/2016 1407   UROBILINOGEN 1.0 10/07/2017 0939   NITRITE Positive (A) 10/07/2017 0939   NITRITE n 08/25/2016 1407   LEUKOCYTESUR Negative 10/07/2017 0939     STUDIES: Mm Breast Surgical Specimen  Result Date: 01/22/2018 CLINICAL DATA:  Specimen radiograph status post left breast lumpectomy. EXAM: SPECIMEN RADIOGRAPH OF THE LEFT BREAST COMPARISON:  Previous exam(s). FINDINGS: Status post excision of the left breast. The radioactive seed and biopsy marker clip are present, completely intact, and were marked for pathology. These findings were communicated with the OR at 9:20 a.m. IMPRESSION: Specimen radiograph of the left breast. Electronically Signed   By: Ammie Ferrier M.D.   On: 01/22/2018 09:21   Mm Lt Radioactive Seed Loc Mammo Guide  Result Date: 01/19/2018 CLINICAL DATA:  Preoperative radioactive seed localization of left breast o'clock mass. EXAM: MAMMOGRAPHIC GUIDED RADIOACTIVE SEED LOCALIZATION OF THE LEFT BREAST COMPARISON:  Previous exam(s). FINDINGS: Patient presents for radioactive seed localization prior to left breast lumpectomy. I met with the patient and we discussed the procedure of seed localization including benefits and alternatives. We discussed the high likelihood of a successful procedure.  We discussed the risks of the procedure including infection, bleeding, tissue injury and further surgery. We discussed the low dose of radioactivity involved in the procedure. Informed, written consent was given. The usual time-out protocol was performed immediately prior to the procedure. Using mammographic guidance, sterile technique, 1% lidocaine and an I-125 radioactive seed, ribbon shaped marker was localized using a superior approach. The follow-up mammogram images confirm the seed in the expected location and were marked for Dr. Dalbert Batman. Follow-up survey of the patient confirms presence of the radioactive seed. Order number of I-125 seed:  [id number]. Total activity:  1.696 millicurie reference Date: December 12, 2017 The patient tolerated the procedure well and was released from the [facility]. She was given instructions regarding seed removal. IMPRESSION: Radioactive seed localization left breast. No apparent complications. Electronically Signed   By: Fidela Salisbury M.D.   On: 01/19/2018 15:10    ELIGIBLE FOR AVAILABLE RESEARCH PROTOCOL: yes (see Research Studies)  ASSESSMENT: 62 y.o. [place], [place] woman status post left breast upper outer quadrant and left axillary lymph node biopsy 08/07/2017, both positive for a T2 N1, stage IIB invasive ductal  carcinoma, grade 3, estrogen receptor weakly positive, progesterone receptor negative, but HER-2 amplified, with an MIB-1 of 80%  (a) staging chest CT and bone scan 08/30/2017 showed no evidence of metastatic disease  (1) neoadjuvant chemotherapy will consist of carboplatin, docetaxel, trastuzumab, and Pertuzumab given every 21 days x 6 starting 09/07/2017, perjeta omitted with cycle 2 and 3 due to diarrhea  (a) Gemcitabine substituted for Docetaxel beginning with cycle 5 and 6 for concerns of neuropathy  (2) trastuzumab will be continued to complete 6 months  (a) echocardiogram 08/28/2017 showed an ejection fraction in the 60-65% range.  (b)  echocardiogram on 11/29/2017 showed an EF of 55-60%  (3) Left lumpectomy on 01/22/2018 shows a ypT1b pN1a, grade 3 residual invasive ductal carcinoma, agnostic panel HER-2 negative, ER 40% weakly positive, PR negative.   (a) foundation one testing requested on 02/02/2018  (b) total of 11 axillary lymph nodes removed (1+)  (4) adjuvant radiation pending  (5) will start antiestrogens at the completion of local therapy  PLAN:  Ilaisaane is doing well today. Her labs are stable and I reviewed those with her in detail.  Her lumpectomy site is healing well.  She met with Dr. Jana Hopkins and we reviewed her pathology results in detail.  She will continue on adjuvant Trastuzumab.  A referral to Dr. Lisbeth Renshaw has been placed, and Madison Hopkins will get that scheduled today.  We will send Freddie's pathology results for foundation one testing and that request has been made.    Esma will return every three weeks for labs and Trastuzumab, she will see Dr. Jana Hopkins in February after radiation and with the Trastuzumab that is due that day.  She knows to call between now and her next appointment for any questions or concerns.  Madison Bihari, NP  02/08/18 3:11 PM Medical Oncology and Hematology [facility] [address] [place], [place] [address] Tel. [phone]    Fax. [phone]   ADDENDUM: I met with Kaylise to review her pathology.  Unfortunately there was residual disease both in the breast and in 1 lymph node.  This indicates a significant risk of relapse.  I would like to intensify her treatment but she does not qualify for our pembrolizumab study or for the [place] study.  Certainly we will continue antiestrogens to a total of 7-10 years.  Also we will request a foundation 1 and PD-L1 testing on this material.  If positive we will consider adding pembrolizumab for 2 years.  I personally saw this patient and performed a substantive portion of this encounter with the listed APP documented above.    Chauncey Cruel, MD Medical Oncology and Hematology [facility] [address] [place], [place] [address] Tel. [phone]    Fax. [phone]

## 2018-02-08 NOTE — Therapy (Signed)
[facility] [place], [place], [address] Phone: [phone]   Fax:  [phone]  Physical Therapy Evaluation  Patient Details  Name: Madison Hopkins MRN: [id number] Date of Birth: Aug 13, 1955 Referring Provider (PT): Dr. Dalbert Batman   Encounter Date: 02/08/2018  PT End of Session - 02/08/18 1401    Visit Number  1    Number of Visits  8    Date for PT Re-Evaluation  05/03/18    PT Start Time  1300    PT Stop Time  1345    PT Time Calculation (min)  45 min    Activity Tolerance  Patient tolerated treatment well    Behavior During Therapy  [facility] for tasks assessed/performed       Past Medical History:  Diagnosis Date  . Abnormal Pap smear of vagina 07/28/2016   LGSIL; colpo 07/2016 atrophic squamous cells; colpo 07/2017 - atypia  . Allergy   . Breast cancer ([place])    Metastatic Left breast  . Elevated hemoglobin A1c 07/28/2016   level - 6.1  . Endometriosis   . Low vitamin D level 07/28/2016   level 24.7    Past Surgical History:  Procedure Laterality Date  . ABDOMINAL HYSTERECTOMY    . BREAST LUMPECTOMY WITH RADIOACTIVE SEED AND AXILLARY LYMPH NODE DISSECTION Left 01/22/2018   Procedure: LEFT BREAST LUMPECTOMY WITH RADIOACTIVE SEED AND COMPLETE LEFT AXILLARY LYMPH NODE DISSECTION;  Surgeon: [name], MD;  Location: [place];  Service: General;  Laterality: Left;  . CESAREAN SECTION    . COLON SURGERY    . OOPHORECTOMY    . PORTACATH PLACEMENT N/A 09/06/2017   Procedure: INSERTION PORT-A-CATH;  Surgeon: [name], MD;  Location: [place];  Service: General;  Laterality: N/A;  . SMALL INTESTINE SURGERY    . SPINE SURGERY      There were no vitals filed for this visit.   Subjective Assessment - 02/08/18 1309    Subjective  decreased ROM in the arm, mild swelling in the breast    Limitations  Lifting    Patient Stated Goals  get ready for radiation    Currently in Pain?  No/denies          [facility] PT Assessment - 02/08/18 0001      Assessment   Medical Diagnosis  Lt breast cancer    Referring Provider (PT)  Dr. Dalbert Batman    Onset Date/Surgical Date  01/22/18    Hand Dominance  Right    Next MD Visit  next week     Prior Therapy  n      Precautions   Precaution Comments  cancer, lymphedema      Restrictions   Weight Bearing Restrictions  No    Other Position/Activity Restrictions  n      Balance Screen   Has the patient fallen in the past 6 months  No    Has the patient had a decrease in activity level because of a fear of falling?   No    Is the patient reluctant to leave their home because of a fear of falling?   No      Home Environment   Living Environment  Private residence    Living Arrangements  Alone    Available Help at Discharge  Family;Friend(s)    Type of Home  House      Prior Function   Level of [place]  Full  time employment    Vocation Requirements  [place] go back 03/22/17    Leisure  walking, dance      Cognition   Overall Cognitive Status  Within Functional Limits for tasks assessed      Observation/Other Assessments   Observations  wearing soft camisole from [place]; discussed options for radiation at  second to nature    Skin Integrity  still has bandage on drain incision, well healing incisions      Sensation   Additional Comments  reports numb in the tricep region      Coordination   Gross Motor Movements are Fluid and Coordinated  Yes      Posture/Postural Control   Posture/Postural Control  Postural limitations    Postural Limitations  Rounded Shoulders;Increased thoracic kyphosis      ROM / Strength   AROM / PROM / Strength  AROM;PROM      AROM   AROM Assessment Site  Shoulder    Right/Left Shoulder  Right;Left    Right Shoulder Flexion  170 Degrees    Right Shoulder ABduction  170 Degrees    Right Shoulder Internal Rotation  75 Degrees    Right Shoulder External  Rotation  85 Degrees    Right Shoulder Horizontal ABduction  30 Degrees    Left Shoulder Flexion  130 Degrees    Left Shoulder ABduction  125 Degrees    Left Shoulder Internal Rotation  80 Degrees    Left Shoulder External Rotation  75 Degrees    Left Shoulder Horizontal ABduction  30 Degrees      PROM   PROM Assessment Site  Shoulder    Right/Left Shoulder  Right;Left    Left Shoulder Flexion  135 Degrees    Left Shoulder ABduction  125 Degrees    Left Shoulder External Rotation  80 Degrees        LYMPHEDEMA/ONCOLOGY QUESTIONNAIRE - 02/08/18 1303      Type   Cancer Type  Lt breast cancer      Surgeries   Lumpectomy Date  01/22/18    Sentinel Lymph Node Biopsy Date  01/22/18    Axillary Lymph Node Dissection Date  01/22/18    Number Lymph Nodes Removed  11   1/11 positive     Treatment   Active Chemotherapy Treatment  No   herceptin every 3 weeks    Past Chemotherapy Treatment  Yes    Date  12/21/17    Active Radiation Treatment  No   radiation will start next week    Past Radiation Treatment  No    Current Hormone Treatment  No    Past Hormone Therapy  No      What other symptoms do you have   Are you Having Heaviness or Tightness  No      Lymphedema Assessments   Lymphedema Assessments  Upper extremities      Right Upper Extremity Lymphedema   10 cm Proximal to Olecranon Process  31.4 cm    Olecranon Process  28.4 cm    10 cm Proximal to Ulnar Styloid Process  20.5 cm    Just Proximal to Ulnar Styloid Process  17.3 cm    Across Hand at [employer]  20.4 cm    At [place] of 2nd Digit  6.9 cm      Left Upper Extremity Lymphedema   10 cm Proximal to Olecranon Process  32.3 cm    Olecranon Process  29  cm    10 cm Proximal to Ulnar Styloid Process  21.1 cm    Just Proximal to Ulnar Styloid Process  17.5 cm    Across Hand at [employer]  20.5 cm    At [place] of 2nd Digit  6.8 cm          Quick Dash - 02/08/18 0001    Open a tight or new jar  No  difficulty    Do heavy household chores (wash walls, wash floors)  Moderate difficulty    Carry a shopping bag or briefcase  Severe difficulty    Wash your back  Severe difficulty    Use a knife to cut food  Severe difficulty    Recreational activities in which you take some force or impact through your arm, shoulder, or hand (golf, hammering, tennis)  Unable    During the past week, to what extent has your arm, shoulder or hand problem interfered with your normal social activities with family, friends, neighbors, or groups?  Modererately    During the past week, to what extent has your arm, shoulder or hand problem limited your work or other regular daily activities  Slightly    Arm, shoulder, or hand pain.  Mild    Tingling (pins and needles) in your arm, shoulder, or hand  Mild    Difficulty Sleeping  Moderate difficulty    DASH Score  50 %        Objective measurements completed on examination: See above findings.              PT Education - 02/08/18 1400    Education Details  lymphedema, lymphedema precautions, initial stretches, radiation side effects, walking    Person(s) Educated  Patient    Methods  Explanation;Demonstration;Tactile cues;Verbal cues;Handout    Comprehension  Verbalized understanding;Returned demonstration;Verbal cues required          PT Long Term Goals - 02/08/18 1406      PT LONG TERM GOAL #1   Title  Pt will improve Lt shoulder flexion and abduction to at least 160    Time  8    Period  Weeks    Status  New    Target Date  04/05/18      PT LONG TERM GOAL #2   Title  Pt will demonstrate MMT of at least 4/5 and equal between sides    Time  8    Period  Weeks    Status  New    Target Date  04/05/17      PT LONG TERM GOAL #3   Title  Pt will be ind with strength ABC and or community exercise programs     Time  8    Period  Weeks    Status  New    Target Date  04/05/17      PT LONG TERM GOAL #4   Title  Pt will be able to don her  shirt without limitations     Time  8    Period  Weeks    Status  New    Target Date  04/05/17             Plan - 02/08/18 1401    Clinical Impression Statement  Lariah presents s/p Lt lumpectomy with ALND on 01/22/18 with drain out 2 days ago.  She reports just overall stiffness and pulling in the arm and no real swelling or discomfort.  She has been pretty  protective of the arm but reports she started driving and moving it around now after the drain has been removed.  She was encouraged to perform PT session at this time due to decreased shoulder ROM, possible low tolerance to radiation position, and decreased UE confidence/fear of movement.      History and Personal Factors relevant to plan of care:  neoadjuvant chemotherapy will consist of carboplatin, docetaxel, trastuzumab, and Pertuzumab given every 21 days x 6 starting 09/07/2017, ) trastuzumab will be continued to complete 6 months,  Left lumpectomy on 01/22/2018 shows a ypT1b pN1a, grade 3 residual invasive ductal carcinoma, agnostic panel HER-2 negative, ER 40% weakly positive, PR negative. 1/11 nodes, complete hysterectomy in the past.     Clinical Presentation  Evolving    Clinical Presentation due to:  new surgical status    Clinical Decision Making  Moderate    Rehab Potential  Excellent    Clinical Impairments Affecting Rehab Potential  beginning radiation    PT Frequency  2x / week   1-2x per week   PT Duration  8 weeks    PT Treatment/Interventions  ADLs/Self Care Home Management;Therapeutic exercise;Manual techniques;Manual lymph drainage;Passive range of motion;Taping    PT Next Visit Plan  Lt shoulder PROM and AAROM for radiation preparation    PT Home Exercise Plan  given post op stretches no wall walking, and standing dowel flexion on 02/08/18    Consulted and Agree with Plan of Care  Patient       Patient will benefit from skilled therapeutic intervention in order to improve the following deficits and  impairments:  Decreased skin integrity, Decreased knowledge of precautions, Decreased activity tolerance, Decreased knowledge of use of DME, Impaired UE functional use, Postural dysfunction  Visit Diagnosis: Malignant neoplasm of upper-outer quadrant of left breast in female, estrogen receptor positive ([place])  Aftercare following surgery for neoplasm  Stiffness of left shoulder, not elsewhere classified  Unspecified disturbances of skin sensation     Problem List Patient Active Problem List   Diagnosis Date Noted  . Port-A-Cath in place 11/09/2017  . Malignant neoplasm of upper-outer quadrant of left breast in female, estrogen receptor positive ([place]) 08/10/2017    Shan Levans, PT 02/08/2018, 2:08 PM  [place], [place], [address] Phone: [phone]   Fax:  [phone]  Name: BOWIE DOIRON MRN: [id number] Date of Birth: [birth year]

## 2018-02-08 NOTE — Progress Notes (Signed)
[facility] one sample was reported as insufficient tissue.  I called today to have it resent from a different sample with more material

## 2018-02-12 NOTE — Progress Notes (Signed)
Location of Breast Cancer:    Malignant neoplasm of upper-outer quadrant of left breast in female, estrogen receptor positive [facility]     Histology per Pathology Report:   Status post excision of the left breast. The radioactive seed and biopsy marker clip are present, completely intact, and were marked for pathology. These findings were communicated with the OR at 9:20 a.m.  Receptor Status: ER(), PR (), Her2-neu (), Ki-()  Did patient present with symptoms (if so, please note symptoms) or was this found on screening mammography?:  Past/Anticipated interventions by surgeon, if any: 01/22/18 Dr. Dalbert Batman ,H LEFT BREAST LUMPECTOMY [place]  Past/Anticipated interventions by medical oncology, if any: Chemotherapy   Lymphedema issues, if any:  Some swelling under her lt arm  Pain issues, some tenderness   SAFETY ISSUES:  Prior radiation no  Pacemaker/ICD? no  Possible current pregnancy no  Is the patient on methotrexate? no  Current Complaints / other details:  Vitals:   02/13/18 0922  BP: 131/82  Pulse: 67  Resp: 18  Temp: 98.1 F (36.7 C)  TempSrc: Oral  SpO2: 99%  Weight: 193 lb 3.2 oz (87.6 kg)   Wt Readings from Last 3 Encounters:  02/13/18 193 lb 2 oz (87.6 kg)  02/13/18 193 lb 3.2 oz (87.6 kg)  02/01/18 190 lb 1.6 oz (86.2 kg)     Mardene Sayer, LPN 28/31/5176,1:60 AM

## 2018-02-13 ENCOUNTER — Ambulatory Visit
Admission: RE | Admit: 2018-02-13 | Discharge: 2018-02-13 | Disposition: A | Discharge status: Home / Self Care | Payer: 59 | Source: Ambulatory Visit | Attending: Radiation Oncology | Admitting: Radiation Oncology

## 2018-02-13 ENCOUNTER — Encounter: Payer: Self-pay | Admitting: Radiation Oncology

## 2018-02-13 ENCOUNTER — Other Ambulatory Visit: Payer: Self-pay

## 2018-02-13 ENCOUNTER — Telehealth: Payer: Self-pay

## 2018-02-13 VITALS — BP 131/82 | HR 67 | Temp 98.1°F | Resp 18 | Wt 193.2 lb

## 2018-02-13 VITALS — BP 131/82 | HR 67 | Temp 98.1°F | Resp 18 | Ht 67.5 in | Wt 193.1 lb

## 2018-02-13 DIAGNOSIS — Z170 Estrogen receptor positive status [ER+]: Secondary | ICD-10-CM | POA: Diagnosis not present

## 2018-02-13 DIAGNOSIS — C50412 Malignant neoplasm of upper-outer quadrant of left female breast: Secondary | ICD-10-CM | POA: Insufficient documentation

## 2018-02-13 DIAGNOSIS — C773 Secondary and unspecified malignant neoplasm of axilla and upper limb lymph nodes: Secondary | ICD-10-CM | POA: Diagnosis not present

## 2018-02-13 DIAGNOSIS — Z79899 Other long term (current) drug therapy: Secondary | ICD-10-CM | POA: Diagnosis not present

## 2018-02-13 NOTE — Telephone Encounter (Signed)
TC to Pt. Per Mendel Ryder, Pt. Requesting [place] for post lumpectomy. Pt. Stated she would like compression [place]. Prescription faxed to [facility] today.

## 2018-02-13 NOTE — Progress Notes (Signed)
Radiation Oncology         [phone] ________________________________  Name: Madison Hopkins        MRN: [id number]  Date of Service: 02/13/2018 DOB: [birth year]  CC:Madison Douglas, PA-C  [name], Madison Dad, MD     REFERRING PHYSICIAN: [name], Madison Dad, MD   DIAGNOSIS: The encounter diagnosis was Malignant neoplasm of upper-outer quadrant of left breast in female, estrogen receptor positive ([place]).   HISTORY OF PRESENT ILLNESS: Madison Hopkins is a 62 y.o. female who was originally seen in the multidisciplinary breast clinic for a new diagnosis of left breast cancer. The patient was noted to have a palpable mass in the axilla. She proceeded with a mammogram which revealed a mass in the upper outer quadrant of the left breast as well as a large mass in the left axilla. She proceeded with diagnostic imaging that revealed a 2.3 x 2.2 x 1.6 cm mass in the 2:00 position of the left breast as well as two enlarged nodes in the axilla measuring at least 2.5 cm. She underwent a biopsy of both sites on 08/07/17 that revealed a grade 3 invasive ductal carcinoma of the breast and node. The cancer was ER positive with weak staining, and PR negative, HER2 was amplified and her Ki 67 was 80%. She had an MRI that revealed a total of 5 cm of enhancement including the mass and at least 3 nodes that were suspcious in the axilla and one intramammary node. She received neoadjuvant chemotherapy and Herceptin between 09/28/17 and 12/21/17. Post treatment MRI revealed complete response in the breast and significant reduction in the nodes. She underwent left lumpectomy and axillary dissection on 01/22/18 that revealed a 9 mm area of residual cancer, 1/11 nodes were involved. Her margins were negative. She comes today to discuss her options of adjuvant radiotherapy.   PREVIOUS RADIATION THERAPY: No   PAST MEDICAL HISTORY:  Past Medical History:  Diagnosis Date  . Abnormal Pap smear of vagina 07/28/2016   LGSIL;  colpo 07/2016 atrophic squamous cells; colpo 07/2017 - atypia  . Allergy   . Breast cancer ([place])    Metastatic Left breast  . Elevated hemoglobin A1c 07/28/2016   level - 6.1  . Endometriosis   . Low vitamin D level 07/28/2016   level 24.7       PAST SURGICAL HISTORY: Past Surgical History:  Procedure Laterality Date  . ABDOMINAL HYSTERECTOMY    . BREAST LUMPECTOMY WITH RADIOACTIVE SEED AND AXILLARY LYMPH NODE DISSECTION Left 01/22/2018   Procedure: LEFT BREAST LUMPECTOMY WITH RADIOACTIVE SEED AND COMPLETE LEFT AXILLARY LYMPH NODE DISSECTION;  Surgeon: [name], MD;  Location: [facility];  Service: General;  Laterality: Left;  . CESAREAN SECTION    . COLON SURGERY    . OOPHORECTOMY    . PORTACATH PLACEMENT N/A 09/06/2017   Procedure: INSERTION PORT-A-CATH;  Surgeon: [name], MD;  Location: [facility];  Service: General;  Laterality: N/A;  . SMALL INTESTINE SURGERY    . SPINE SURGERY       FAMILY HISTORY:  Family History  Problem Relation Age of Onset  . Mental illness Mother   . Alcoholism Mother   . Cirrhosis Mother   . Cancer Father   . Lung cancer Father      SOCIAL HISTORY:  reports that she has never smoked. She has never used smokeless tobacco. She reports previous alcohol use. She reports current drug use. Drug: Marijuana. The patient is single and  lives in [place]. She is accompanied by her friend. She works for [employer]   ALLERGIES: Penicillins   MEDICATIONS:  Current Outpatient Medications  Medication Sig Dispense Refill  . Artificial Tear Solution (OPTI-TEARS OP) Place 1 drop into both eyes 3 (three) times daily as needed (for dry/irritated eyes.).    Marland Kitchen fluticasone (FLONASE) 50 MCG/ACT nasal spray Place 1 spray into both nostrils daily as needed for allergies.    Marland Kitchen tetrahydrozoline (VISINE) 0.05 % ophthalmic solution Place 1 drop into both eyes 3 (three) times daily as needed (for dry eyes.).    Marland Kitchen acyclovir ointment (ZOVIRAX)  5 % Apply 1 application topically every 4 (four) hours. (Patient not taking: Reported on 02/13/2018) 15 g 2  . cholestyramine (QUESTRAN) 4 GM/DOSE powder Take 1 packet (4 g total) by mouth 2 (two) times daily with a meal. (Patient not taking: Reported on 02/13/2018) 378 g 2  . HYDROcodone-acetaminophen (NORCO) 5-325 MG tablet 1/2 to 1 tablets Q 4 hours prn pain (Patient not taking: Reported on 02/13/2018) 30 tablet 0  . HYDROcodone-acetaminophen (NORCO) 5-325 MG tablet Take 1-2 tablets by mouth every 6 (six) hours as needed for moderate pain or severe pain. (Patient not taking: Reported on 02/13/2018) 30 tablet 0  . hydrocortisone (ANUSOL-HC) 25 MG suppository Place 1 suppository (25 mg total) rectally 2 (two) times daily. (Patient not taking: Reported on 02/13/2018) 12 suppository 0  . loperamide (IMODIUM) 2 MG capsule Take 1 capsule (2 mg total) by mouth as needed for diarrhea or loose stools. (Patient not taking: Reported on 02/13/2018) 30 capsule 0  . LORazepam (ATIVAN) 1 MG tablet Take 1 mg by mouth as needed for anxiety.    . Misc Natural Products (OSTEO BI-FLEX ADV TRIPLE ST PO) Take 1 tablet by mouth daily after lunch.    . Multiple Vitamin (MULTIVITAMIN WITH MINERALS) TABS tablet Take 1 tablet by mouth daily after lunch.    . naproxen sodium (ALEVE) 220 MG tablet Take 220 mg by mouth 2 (two) times daily as needed (FOR PAIN.).     Marland Kitchen valACYclovir (VALTREX) 500 MG tablet Take 1 tablet (500 mg total) by mouth 2 (two) times daily. (Patient not taking: Reported on 02/13/2018) 60 tablet 2   No current facility-administered medications for this encounter.      REVIEW OF SYSTEMS: On review of systems, the patient reports that she is doing well overall. She feels like she's recovering from surgery well. She denies any chest pain, shortness of breath, cough, fevers, chills, night sweats, unintended weight changes. She denies any bowel or bladder disturbances, and denies abdominal pain, nausea or  vomiting. She denies any new musculoskeletal or joint aches or pains. A complete review of systems is obtained and is otherwise negative.      PHYSICAL EXAM:  Wt Readings from Last 3 Encounters:  02/13/18 193 lb 2 oz (87.6 kg)  02/13/18 193 lb 3.2 oz (87.6 kg)  02/01/18 190 lb 1.6 oz (86.2 kg)   Temp Readings from Last 3 Encounters:  02/13/18 98.1 F (36.7 C) (Oral)  02/13/18 98.1 F (36.7 C) (Oral)  02/01/18 98.6 F (37 C) (Oral)   BP Readings from Last 3 Encounters:  02/13/18 131/82  02/13/18 131/82  02/01/18 135/63   Pulse Readings from Last 3 Encounters:  02/13/18 67  02/13/18 67  02/01/18 71     In general this is a well appearing African American female in no acute distress. She is alert and oriented x4 and appropriate throughout  the examination. HEENT reveals that the patient is normocephalic, atraumatic. EOMs are intact. PERRLA. Skin is intact without any evidence of gross lesions. Cardiopulmonary assessment is negative for acute distress and she exhibits normal effort. Her left breast is healing well without erythema or fullness.    ECOG = 1  0 - Asymptomatic (Fully active, able to carry on all predisease activities without restriction)  1 - Symptomatic but completely ambulatory (Restricted in physically strenuous activity but ambulatory and able to carry out work of a light or sedentary nature. For example, light housework, office work)  2 - Symptomatic, <50% in bed during the day (Ambulatory and capable of all self care but unable to carry out any work activities. Up and about more than 50% of waking hours)  3 - Symptomatic, >50% in bed, but not bedbound (Capable of only limited self-care, confined to bed or chair 50% or more of waking hours)  4 - Bedbound (Completely disabled. Cannot carry on any self-care. Totally confined to bed or chair)  5 - Death   Eustace Pen MM, Creech RH, Tormey DC, et al. [phone]). "Toxicity and response criteria of the [facility]". [place] Oncol. 5 (6): 649-55    LABORATORY DATA:  Lab Results  Component Value Date   WBC 6.0 02/01/2018   HGB 10.0 (L) 02/01/2018   HCT 32.9 (L) 02/01/2018   MCV 95.6 02/01/2018   PLT 312 02/01/2018   Lab Results  Component Value Date   NA 141 02/01/2018   K 3.4 (L) 02/01/2018   CL 108 02/01/2018   CO2 25 02/01/2018   Lab Results  Component Value Date   ALT 17 02/01/2018   AST 16 02/01/2018   ALKPHOS 127 (H) 02/01/2018   BILITOT 0.3 02/01/2018      RADIOGRAPHY: Mm Breast Surgical Specimen  Result Date: 01/22/2018 CLINICAL DATA:  Specimen radiograph status post left breast lumpectomy. EXAM: SPECIMEN RADIOGRAPH OF THE LEFT BREAST COMPARISON:  Previous exam(s). FINDINGS: Status post excision of the left breast. The radioactive seed and biopsy marker clip are present, completely intact, and were marked for pathology. These findings were communicated with the OR at 9:20 a.m. IMPRESSION: Specimen radiograph of the left breast. Electronically Signed   By: Ammie Ferrier M.D.   On: 01/22/2018 09:21   Mm Lt Radioactive Seed Loc Mammo Guide  Result Date: 01/19/2018 CLINICAL DATA:  Preoperative radioactive seed localization of left breast o'clock mass. EXAM: MAMMOGRAPHIC GUIDED RADIOACTIVE SEED LOCALIZATION OF THE LEFT BREAST COMPARISON:  Previous exam(s). FINDINGS: Patient presents for radioactive seed localization prior to left breast lumpectomy. I met with the patient and we discussed the procedure of seed localization including benefits and alternatives. We discussed the high likelihood of a successful procedure. We discussed the risks of the procedure including infection, bleeding, tissue injury and further surgery. We discussed the low dose of radioactivity involved in the procedure. Informed, written consent was given. The usual time-out protocol was performed immediately prior to the procedure. Using mammographic guidance, sterile technique, 1% lidocaine  and an I-125 radioactive seed, ribbon shaped marker was localized using a superior approach. The follow-up mammogram images confirm the seed in the expected location and were marked for Dr. Dalbert Batman. Follow-up survey of the patient confirms presence of the radioactive seed. Order number of I-125 seed:  [id number]. Total activity:  8.299 millicurie reference Date: December 12, 2017 The patient tolerated the procedure well and was released from the [place]. She was given instructions regarding seed  removal. IMPRESSION: Radioactive seed localization left breast. No apparent complications. Electronically Signed   By: Fidela Salisbury M.D.   On: 01/19/2018 15:10       IMPRESSION/PLAN: 1. Stage IIB, cT2N1M0, grade 3 ER positive, HER2 amplified invasive ductal carcinoma of the left breast. Dr. Lisbeth Renshaw discusses the final pathology findings and reviews the nature of HER2 amplified, left sided breast disease. She has done well since completing chemotherapy and surgery. She will continue Herceptin every 3 weeks. Dr. Lisbeth Renshaw reviews the rational for adjuvant radiotherapy. We discussed the risks, benefits, short, and long term effects of radiotherapy, and the patient is interested in proceeding. Dr. Lisbeth Renshaw discusses the delivery and logistics of radiotherapy and anticipates a course of 6 1/2 weeks of radiotherapy with deep inspiration breath hold technique to the breast/chest wall and regional node sites. Written consent is obtained and placed in the chart, a copy was provided to the patient. She will simulate following this appointment.   In a visit lasting 45 minutes, greater than 50% of the time was spent face to face discussing her case, and coordinating the patient's care.  The above documentation reflects my direct findings during this shared patient visit. Please see the separate note by Dr. Lisbeth Renshaw on this date for the remainder of the patient's plan of care.    Carola Rhine, PAC

## 2018-02-15 ENCOUNTER — Ambulatory Visit: Payer: 59 | Admitting: Rehabilitation

## 2018-02-15 ENCOUNTER — Encounter: Payer: Self-pay | Admitting: Rehabilitation

## 2018-02-15 DIAGNOSIS — C50412 Malignant neoplasm of upper-outer quadrant of left female breast: Secondary | ICD-10-CM | POA: Diagnosis not present

## 2018-02-15 DIAGNOSIS — Z170 Estrogen receptor positive status [ER+]: Principal | ICD-10-CM

## 2018-02-15 DIAGNOSIS — Z483 Aftercare following surgery for neoplasm: Secondary | ICD-10-CM

## 2018-02-15 DIAGNOSIS — R209 Unspecified disturbances of skin sensation: Secondary | ICD-10-CM

## 2018-02-15 DIAGNOSIS — M25612 Stiffness of left shoulder, not elsewhere classified: Secondary | ICD-10-CM

## 2018-02-15 NOTE — Patient Instructions (Signed)
Access Code: [facility]  URL: [email or web address]  Date: 02/15/2018  Prepared by: Shan Levans   Exercises  Doorway Pec Stretch at 90 Degrees Abduction - 3 reps - 1 sets - 20-30 seconds hold - 1x daily - 7x weekly  Standing Shoulder Flexion Wall Walk - 10 reps - 1x daily - 7x weekly  Low Trap Setting at [place] - 10 reps - 1x daily - 7x weekly

## 2018-02-15 NOTE — Therapy (Signed)
[place], [place], [address] Phone: [phone]   Fax:  [phone]  Physical Therapy Treatment  Patient Details  Name: Madison Hopkins MRN: [id number] Date of Birth: [birth year] Referring Provider (PT): Dr. Dalbert Batman   Encounter Date: 02/15/2018  PT End of Session - 02/15/18 1603    Visit Number  2    Number of Visits  8    Date for PT Re-Evaluation  05/03/18    PT Start Time  1540    PT Stop Time  1600    PT Time Calculation (min)  45 min    Activity Tolerance  Patient tolerated treatment well    Behavior During Therapy  [facility] for tasks assessed/performed       Past Medical History:  Diagnosis Date  . Abnormal Pap smear of vagina 07/28/2016   LGSIL; colpo 07/2016 atrophic squamous cells; colpo 07/2017 - atypia  . Allergy   . Breast cancer ([place])    Metastatic Left breast  . Elevated hemoglobin A1c 07/28/2016   level - 6.1  . Endometriosis   . Low vitamin D level 07/28/2016   level 24.7    Past Surgical History:  Procedure Laterality Date  . ABDOMINAL HYSTERECTOMY    . BREAST LUMPECTOMY WITH RADIOACTIVE SEED AND AXILLARY LYMPH NODE DISSECTION Left 01/22/2018   Procedure: LEFT BREAST LUMPECTOMY WITH RADIOACTIVE SEED AND COMPLETE LEFT AXILLARY LYMPH NODE DISSECTION;  Surgeon: [name], MD;  Location: [place];  Service: General;  Laterality: Left;  . CESAREAN SECTION    . COLON SURGERY    . OOPHORECTOMY    . PORTACATH PLACEMENT N/A 09/06/2017   Procedure: INSERTION PORT-A-CATH;  Surgeon: [name], MD;  Location: [place];  Service: General;  Laterality: N/A;  . SMALL INTESTINE SURGERY    . SPINE SURGERY      There were no vitals filed for this visit.  Subjective Assessment - 02/15/18 1510    Subjective  the simulation went well . The breast was tender but the arm was okay.  1/6 first day of radiation.  Going back to work on Monday and then Friday.     Patient Stated  Goals  get ready for radiation    Currently in Pain?  Yes    Pain Score  2     Pain Location  Arm    Pain Orientation  Left    Pain Descriptors / Indicators  Aching    Pain Type  Surgical pain    Pain Onset  1 to 4 weeks ago    Pain Frequency  Constant    Aggravating Factors   its just there    Pain Relieving Factors  rest         [facility] PT Assessment - 02/15/18 0001      AROM   Left Shoulder Flexion  145 Degrees    Left Shoulder ABduction  140 Degrees                   [facility] Adult PT Treatment/Exercise - 02/15/18 0001      Exercises   Exercises  Shoulder;Other Exercises    Other Exercises   updated HEP medbridge and discussed plan for pt to check in in 2 weeks due to schedule       Shoulder Exercises: Seated   Other Seated Exercises  XYW movement x 10      Shoulder Exercises: Standing   Other Standing Exercises  wall flexion  with lift off x 10, doorway stretch 2x20" both both with vcs and added to HEP      Shoulder Exercises: Pulleys   Flexion  2 minutes      Manual Therapy   Manual Therapy  Passive ROM    Passive ROM  to Lt shoulder with flexion and abduction focus alsoER at 90deg                  PT Long Term Goals - 02/08/18 1406      PT LONG TERM GOAL #1   Title  Pt will improve Lt shoulder flexion and abduction to at least 160    Time  8    Period  Weeks    Status  New    Target Date  04/05/18      PT LONG TERM GOAL #2   Title  Pt will demonstrate MMT of at least 4/5 and equal between sides    Time  8    Period  Weeks    Status  New    Target Date  04/05/17      PT LONG TERM GOAL #3   Title  Pt will be ind with strength ABC and or community exercise programs     Time  8    Period  Weeks    Status  New    Target Date  04/05/17      PT LONG TERM GOAL #4   Title  Pt will be able to don her shirt without limitations     Time  8    Period  Weeks    Status  New    Target Date  04/05/17            Plan - 02/15/18  1603    Clinical Impression Statement  Pt has been making excellent progress towards ROM. She had her simulation for radiation 2 days okay and reports is was comfortable for the UE.  She finally removed her dressings and the incisions are healing well with glue and scabbing still present.  Pt is anxious about how many things she has on her schedule now so we decided to have her check in in 2 weeks for new exercise instruction.      PT Frequency  2x / week    PT Duration  8 weeks    PT Treatment/Interventions  ADLs/Self Care Home Management;Therapeutic exercise;Manual techniques;Manual lymph drainage;Passive range of motion;Taping    PT Next Visit Plan  lt shoulder ROM and strength; teach supine scap     PT Home Exercise Plan  given post op stretches no wall walking, and standing dowel flexion on 02/08/18, Access Code: [email or web address]     Consulted and Agree with Plan of Care  Patient       Patient will benefit from skilled therapeutic intervention in order to improve the following deficits and impairments:  Decreased skin integrity, Decreased knowledge of precautions, Decreased activity tolerance, Decreased knowledge of use of DME, Impaired UE functional use, Postural dysfunction  Visit Diagnosis: Malignant neoplasm of upper-outer quadrant of left breast in female, estrogen receptor positive ([place])  Aftercare following surgery for neoplasm  Stiffness of left shoulder, not elsewhere classified  Unspecified disturbances of skin sensation     Problem List Patient Active Problem List   Diagnosis Date Noted  . Port-A-Cath in place 11/09/2017  . Malignant neoplasm of upper-outer quadrant of left breast in female, estrogen receptor positive ([place]) 08/10/2017    Shan Levans, PT  02/15/2018, 4:06 PM  [place], [place], [address] Phone: [phone]   Fax:  [phone]  Name: Madison Hopkins MRN: [id number] Date of  Birth: 1955/09/09

## 2018-02-16 ENCOUNTER — Encounter

## 2018-02-20 NOTE — Progress Notes (Signed)
  Radiation Oncology         [phone]) [phone] ________________________________  Name: Madison Hopkins MRN: [id number]  Date: 02/13/2018  DOB: January 31, 1956  Diagnosis DIAGNOSIS:     ICD-10-CM   1. Malignant neoplasm of upper-outer quadrant of left breast in female, estrogen receptor positive ([place]) C50.412    Z17.0      SIMULATION AND TREATMENT PLANNING NOTE  The patient presented for simulation prior to beginning her course of radiation treatment for her diagnosis of left-sided breast cancer. The patient was placed in a supine position on a breast board. A customized vac-lock bag was also constructed and this complex treatment device will be used on a daily basis during her treatment. In this fashion, a CT scan was obtained through the chest area and an isocenter was placed near the chest wall at the upper aspect of the right chest. A breath-hold technique has also been evaluated to determine if this significantly improves the spatial relationship between the target region and the heart. Based on this analysis, a breath-hold technique has been ordered for the patient's treatment.  The patient will be planned to receive a course of radiation initially to a dose of 50.4 gray. This will consist of a 4 field technique targeting the left breast as well as the supraclavicular region. Therefore 2 customized medial and lateral tangent fields have been created targeting the chest wall, and also 2 additional customized fields have been designed to treat the supraclavicular region both with a left supraclavicular field and a left posterior axillary boost field. A forward planning/reduced field technique will also be evaluated to determine if this significantly improves the dose homogeneity of the overall plan. Therefore, additional customized blocks/fields may be necessary.  This initial treatment will be accomplished at 1.8 gray per fraction.   The initial plan will consist of a 3-D conformal technique. The  target volume/scar, heart and lungs have been contoured and dose volume histograms of each of these structures will be evaluated as part of the 3-D conformal treatment planning process.   It is anticipated that the patient will then receive a 10 gray boost to the surgical scar. This will be accomplished at 2 gray per fraction. The final anticipated total dose therefore will correspond to 60.4 gray.   Special treatment procedure was performed today due to the extra time and effort required by myself to plan and prepare this patient for deep inspiration breath hold technique.  I have determined cardiac sparing to be of benefit to this patient to prevent long term cardiac damage due to radiation of the heart.  Bellows were placed on the patient's abdomen. To facilitate cardiac sparing, the patient was coached by the radiation therapists on breath hold techniques and breathing practice was performed. Practice waveforms were obtained. The patient was then scanned while maintaining breath hold in the treatment position.  This image was then transferred over to the imaging specialist. The imaging specialist then created a fusion of the free breathing and breath hold scans using the chest wall as the stable structure. I personally reviewed the fusion in axial, coronal and sagittal image planes.  Excellent cardiac sparing was obtained.  I felt the patient is an appropriate candidate for breath hold and the patient will be treated as such.  The image fusion was then reviewed with the patient to reinforce the necessity of reproducible breath hold.     _______________________________   Jodelle Gross, MD, PhD

## 2018-02-20 NOTE — Progress Notes (Signed)
  Radiation Oncology         [phone] ________________________________  Name: Madison Hopkins MRN: [id number]  Date: 02/13/2018  DOB: 1955/06/14  Optical Surface Tracking Plan:  Since intensity modulated radiotherapy (IMRT) and 3D conformal radiation treatment methods are predicated on accurate and precise positioning for treatment, intrafraction motion monitoring is medically necessary to ensure accurate and safe treatment delivery.  The ability to quantify intrafraction motion without excessive ionizing radiation dose can only be performed with optical surface tracking. Accordingly, surface imaging offers the opportunity to obtain 3D measurements of patient position throughout IMRT and 3D treatments without excessive radiation exposure.  I am ordering optical surface tracking for this patient's upcoming course of radiotherapy. ________________________________  Kyung Rudd, MD 02/20/2018 3:31 PM    Reference:   Ursula Alert, J, et al. Surface imaging-based analysis of intrafraction motion for breast radiotherapy patients.Journal of [place], n. 6, nov. 2014. ISSN [id number].   Available at: <http://www.jacmp.org/index.php/jacmp/article/view/4957>.

## 2018-02-22 ENCOUNTER — Encounter (HOSPITAL_COMMUNITY): Payer: Self-pay | Admitting: Oncology

## 2018-02-22 DIAGNOSIS — C50912 Malignant neoplasm of unspecified site of left female breast: Secondary | ICD-10-CM | POA: Diagnosis not present

## 2018-02-23 ENCOUNTER — Telehealth: Payer: Self-pay

## 2018-02-23 ENCOUNTER — Other Ambulatory Visit: Payer: 59

## 2018-02-23 ENCOUNTER — Ambulatory Visit: Payer: 59

## 2018-02-23 ENCOUNTER — Other Ambulatory Visit: Payer: Self-pay

## 2018-02-23 ENCOUNTER — Ambulatory Visit: Payer: Self-pay

## 2018-02-23 ENCOUNTER — Inpatient Hospital Stay: Payer: 59

## 2018-02-23 VITALS — BP 150/76 | HR 58 | Temp 98.0°F | Resp 16

## 2018-02-23 DIAGNOSIS — C50412 Malignant neoplasm of upper-outer quadrant of left female breast: Secondary | ICD-10-CM

## 2018-02-23 DIAGNOSIS — Z170 Estrogen receptor positive status [ER+]: Secondary | ICD-10-CM

## 2018-02-23 DIAGNOSIS — Z95828 Presence of other vascular implants and grafts: Secondary | ICD-10-CM

## 2018-02-23 DIAGNOSIS — Z5112 Encounter for antineoplastic immunotherapy: Secondary | ICD-10-CM | POA: Diagnosis not present

## 2018-02-23 LAB — CBC WITH DIFFERENTIAL/PLATELET
Abs Immature Granulocytes: 0.01 10*3/uL (ref 0.00–0.07)
Basophils Absolute: 0 10*3/uL (ref 0.0–0.1)
Basophils Relative: 0 %
Eosinophils Absolute: 0.2 10*3/uL (ref 0.0–0.5)
Eosinophils Relative: 3 %
HCT: 33.2 % — ABNORMAL LOW (ref 36.0–46.0)
Hemoglobin: 10.3 g/dL — ABNORMAL LOW (ref 12.0–15.0)
Immature Granulocytes: 0 %
Lymphocytes Relative: 42 %
Lymphs Abs: 3.4 10*3/uL (ref 0.7–4.0)
MCH: 28.8 pg (ref 26.0–34.0)
MCHC: 31 g/dL (ref 30.0–36.0)
MCV: 92.7 fL (ref 80.0–100.0)
Monocytes Absolute: 0.6 10*3/uL (ref 0.1–1.0)
Monocytes Relative: 8 %
Neutro Abs: 3.7 10*3/uL (ref 1.7–7.7)
Neutrophils Relative %: 47 %
Platelets: 261 10*3/uL (ref 150–400)
RBC: 3.58 MIL/uL — ABNORMAL LOW (ref 3.87–5.11)
RDW: 12.6 % (ref 11.5–15.5)
WBC: 8 10*3/uL (ref 4.0–10.5)
nRBC: 0 % (ref 0.0–0.2)

## 2018-02-23 LAB — COMPREHENSIVE METABOLIC PANEL
ALT: 21 U/L (ref 0–44)
AST: 17 U/L (ref 15–41)
Albumin: 4 g/dL (ref 3.5–5.0)
Alkaline Phosphatase: 116 U/L (ref 38–126)
Anion gap: 9 (ref 5–15)
BUN: 8 mg/dL (ref 8–23)
CO2: 25 mmol/L (ref 22–32)
Calcium: 9.7 mg/dL (ref 8.9–10.3)
Chloride: 108 mmol/L (ref 98–111)
Creatinine, Ser: 0.76 mg/dL (ref 0.44–1.00)
GFR calc Af Amer: >60 mL/min (ref >60)
GFR calc non Af Amer: >60 mL/min (ref >60)
Glucose, Bld: 85 mg/dL (ref 70–99)
Potassium: 3.3 mmol/L — ABNORMAL LOW (ref 3.5–5.1)
Sodium: 142 mmol/L (ref 135–145)
Total Bilirubin: 0.5 mg/dL (ref 0.3–1.2)
Total Protein: 7.1 g/dL (ref 6.5–8.1)

## 2018-02-23 MED ORDER — SODIUM CHLORIDE 0.9% FLUSH
10.0000 mL | Freq: Once | INTRAVENOUS | Status: AC
  Administered 2018-02-23: 10 mL
  Filled 2018-02-23: qty 10

## 2018-02-23 MED ORDER — SODIUM CHLORIDE 0.9 % IV SOLN
Freq: Once | INTRAVENOUS | Status: AC
  Administered 2018-02-23: 15:00:00 via INTRAVENOUS
  Filled 2018-02-23: qty 250

## 2018-02-23 MED ORDER — SODIUM CHLORIDE 0.9% FLUSH
10.0000 mL | INTRAVENOUS | Status: DC | PRN
  Administered 2018-02-23: 10 mL
  Filled 2018-02-23: qty 10

## 2018-02-23 MED ORDER — ACETAMINOPHEN 325 MG PO TABS
ORAL_TABLET | ORAL | Status: AC
  Filled 2018-02-23: qty 2

## 2018-02-23 MED ORDER — HEPARIN SOD (PORK) LOCK FLUSH 100 UNIT/ML IV SOLN
500.0000 [IU] | Freq: Once | INTRAVENOUS | Status: AC | PRN
  Administered 2018-02-23: 500 [IU]
  Filled 2018-02-23: qty 5

## 2018-02-23 MED ORDER — DIPHENHYDRAMINE HCL 25 MG PO CAPS
25.0000 mg | ORAL_CAPSULE | Freq: Once | ORAL | Status: AC
  Administered 2018-02-23: 25 mg via ORAL

## 2018-02-23 MED ORDER — ACETAMINOPHEN 325 MG PO TABS
650.0000 mg | ORAL_TABLET | Freq: Once | ORAL | Status: AC
  Administered 2018-02-23: 650 mg via ORAL

## 2018-02-23 MED ORDER — DIPHENHYDRAMINE HCL 25 MG PO CAPS
ORAL_CAPSULE | ORAL | Status: AC
  Filled 2018-02-23: qty 1

## 2018-02-23 MED ORDER — TRASTUZUMAB CHEMO 150 MG IV SOLR
6.0000 mg/kg | Freq: Once | INTRAVENOUS | Status: AC
  Administered 2018-02-23: 546 mg via INTRAVENOUS
  Filled 2018-02-23: qty 26

## 2018-02-23 NOTE — Patient Instructions (Signed)
[facility] Discharge Instructions for Patients Receiving Chemotherapy  Today you received the following chemotherapy agents Trastuzumab (HERCEPTIN).  To help prevent nausea and vomiting after your treatment, we encourage you to take your nausea medication as prescribed.   If you develop nausea and vomiting that is not controlled by your nausea medication, call the clinic.   BELOW ARE SYMPTOMS THAT SHOULD BE REPORTED IMMEDIATELY:  *FEVER GREATER THAN 100.5 F  *CHILLS WITH OR WITHOUT FEVER  NAUSEA AND VOMITING THAT IS NOT CONTROLLED WITH YOUR NAUSEA MEDICATION  *UNUSUAL SHORTNESS OF BREATH  *UNUSUAL BRUISING OR BLEEDING  TENDERNESS IN MOUTH AND THROAT WITH OR WITHOUT PRESENCE OF ULCERS  *URINARY PROBLEMS  *BOWEL PROBLEMS  UNUSUAL RASH Items with * indicate a potential emergency and should be followed up as soon as possible.  Feel free to call the clinic should you have any questions or concerns. The clinic phone number is [phone].  Please show the CHEMO ALERT CARD at check-in to the Emergency Department and triage nurse.   

## 2018-02-23 NOTE — Telephone Encounter (Signed)
Spoke with patient to inform of low potassium.  Per NP recommendation, pt needs to eat more potassium rich foods.  Patient voiced understanding.

## 2018-02-23 NOTE — Telephone Encounter (Signed)
-----   Message from Gardenia Phlegm, NP sent at 02/23/2018  3:26 PM EST ----- Potassium is borderline low, recommend she eat potassium rich foods ----- Message ----- From: Buel Ream, Lab In [place] Sent: 02/23/2018   2:51 PM EST To: Chauncey Cruel, MD

## 2018-02-27 ENCOUNTER — Other Ambulatory Visit: Payer: Self-pay | Admitting: Oncology

## 2018-03-01 DIAGNOSIS — Z170 Estrogen receptor positive status [ER+]: Secondary | ICD-10-CM | POA: Insufficient documentation

## 2018-03-01 DIAGNOSIS — C50412 Malignant neoplasm of upper-outer quadrant of left female breast: Secondary | ICD-10-CM

## 2018-03-01 DIAGNOSIS — Z510 Encounter for antineoplastic radiation therapy: Secondary | ICD-10-CM | POA: Insufficient documentation

## 2018-03-05 ENCOUNTER — Ambulatory Visit
Admission: RE | Admit: 2018-03-05 | Discharge: 2018-03-05 | Disposition: A | Discharge status: Home / Self Care | Payer: 59 | Source: Ambulatory Visit | Attending: Radiation Oncology | Admitting: Radiation Oncology

## 2018-03-05 DIAGNOSIS — R209 Unspecified disturbances of skin sensation: Secondary | ICD-10-CM | POA: Diagnosis not present

## 2018-03-05 DIAGNOSIS — C50412 Malignant neoplasm of upper-outer quadrant of left female breast: Secondary | ICD-10-CM | POA: Diagnosis not present

## 2018-03-05 DIAGNOSIS — Z5112 Encounter for antineoplastic immunotherapy: Secondary | ICD-10-CM | POA: Diagnosis not present

## 2018-03-05 DIAGNOSIS — Z170 Estrogen receptor positive status [ER+]: Secondary | ICD-10-CM | POA: Diagnosis not present

## 2018-03-05 DIAGNOSIS — C773 Secondary and unspecified malignant neoplasm of axilla and upper limb lymph nodes: Secondary | ICD-10-CM | POA: Diagnosis not present

## 2018-03-05 DIAGNOSIS — M25612 Stiffness of left shoulder, not elsewhere classified: Secondary | ICD-10-CM | POA: Diagnosis not present

## 2018-03-05 DIAGNOSIS — Z483 Aftercare following surgery for neoplasm: Secondary | ICD-10-CM | POA: Diagnosis not present

## 2018-03-06 ENCOUNTER — Ambulatory Visit
Admission: RE | Admit: 2018-03-06 | Discharge: 2018-03-06 | Disposition: A | Discharge status: Home / Self Care | Payer: 59 | Source: Ambulatory Visit | Attending: Radiation Oncology | Admitting: Radiation Oncology

## 2018-03-06 DIAGNOSIS — C50412 Malignant neoplasm of upper-outer quadrant of left female breast: Secondary | ICD-10-CM | POA: Diagnosis not present

## 2018-03-06 DIAGNOSIS — I10 Essential (primary) hypertension: Secondary | ICD-10-CM | POA: Diagnosis not present

## 2018-03-06 DIAGNOSIS — Z170 Estrogen receptor positive status [ER+]: Secondary | ICD-10-CM | POA: Diagnosis not present

## 2018-03-06 DIAGNOSIS — Z5112 Encounter for antineoplastic immunotherapy: Secondary | ICD-10-CM | POA: Diagnosis not present

## 2018-03-07 ENCOUNTER — Encounter (HOSPITAL_COMMUNITY): Payer: Self-pay | Admitting: Internal Medicine

## 2018-03-07 ENCOUNTER — Ambulatory Visit
Admission: RE | Admit: 2018-03-07 | Discharge: 2018-03-07 | Disposition: A | Discharge status: Home / Self Care | Payer: 59 | Source: Ambulatory Visit | Attending: Radiation Oncology | Admitting: Radiation Oncology

## 2018-03-07 ENCOUNTER — Ambulatory Visit (HOSPITAL_BASED_OUTPATIENT_CLINIC_OR_DEPARTMENT_OTHER)
Admission: RE | Admit: 2018-03-07 | Discharge: 2018-03-07 | Disposition: A | Discharge status: Home / Self Care | Payer: 59 | Source: Ambulatory Visit | Attending: Internal Medicine | Admitting: Internal Medicine

## 2018-03-07 ENCOUNTER — Ambulatory Visit (HOSPITAL_COMMUNITY)
Admission: RE | Admit: 2018-03-07 | Discharge: 2018-03-07 | Disposition: A | Discharge status: Home / Self Care | Payer: 59 | Source: Ambulatory Visit | Attending: Internal Medicine | Admitting: Internal Medicine

## 2018-03-07 VITALS — BP 138/82 | HR 85 | Wt 197.0 lb

## 2018-03-07 DIAGNOSIS — Z79899 Other long term (current) drug therapy: Secondary | ICD-10-CM | POA: Diagnosis not present

## 2018-03-07 DIAGNOSIS — C50412 Malignant neoplasm of upper-outer quadrant of left female breast: Secondary | ICD-10-CM | POA: Diagnosis present

## 2018-03-07 DIAGNOSIS — I10 Essential (primary) hypertension: Secondary | ICD-10-CM | POA: Diagnosis not present

## 2018-03-07 DIAGNOSIS — Z170 Estrogen receptor positive status [ER+]: Secondary | ICD-10-CM

## 2018-03-07 DIAGNOSIS — Z5112 Encounter for antineoplastic immunotherapy: Secondary | ICD-10-CM | POA: Diagnosis not present

## 2018-03-07 DIAGNOSIS — Z880 Allergy status to penicillin: Secondary | ICD-10-CM | POA: Diagnosis not present

## 2018-03-07 DIAGNOSIS — Z801 Family history of malignant neoplasm of trachea, bronchus and lung: Secondary | ICD-10-CM | POA: Insufficient documentation

## 2018-03-07 NOTE — Progress Notes (Signed)
  Echocardiogram 2D Echocardiogram has been performed.  Madison Hopkins M 03/07/2018, 8:31 AM

## 2018-03-07 NOTE — Patient Instructions (Signed)
Your physician has requested that you have an echocardiogram. Echocardiography is a painless test that uses sound waves to create images of your heart. It provides your doctor with information about the size and shape of your heart and how well your heart's chambers and valves are working. This procedure takes approximately one hour. There are no restrictions for this procedure.  Follow up with Dr. Bensimhon in 3 months. 

## 2018-03-07 NOTE — Progress Notes (Signed)
CARDIO-ONCOLOGY CLINIC CONSULT NOTE  Referring Physician: Dr. Jana Hakim  HPI:  Madison Hopkins is 64 y.o. female with left breast cancer referred by Dr. Jana Hakim for enrollment into the Cardio-Oncology program during Herceptin therapy.  Denies any h/o heart disease, HTN, HL. Diagnosed with left breast CA 08/07/2017, both positive for a T2 N1, stage IIB invasive ductal carcinoma, grade 3, estrogen receptor weakly positive, progesterone receptor negative, but HER-2 amplified, with an MIB-1 of 80%             (a) staging chest CT and bone scan 08/30/2017 showed no evidence of metastatic disease  (1) neoadjuvant chemotherapy will consist of carboplatin, docetaxel, trastuzumab, and Pertuzumab given every 21 days x 6 starting 09/07/2017, perjeta omitted with cycle 2 and 3 due to diarrhea  (2) trastuzumab will be continued to complete 6 months             (a) echocardiogram 08/28/2017 showed an ejection fraction in the 60-65% range.  (3) lumpectomy 01/22/18  (4) currently undergoing XRT with Dr. Lisbeth Renshaw. - will complete mid February 2020  (5) will start antiestrogens at the completion of local therapy   Has finished four cycles of Docetaxel, Carbo, Trastuzumab and Pertuzumab. Her labs are stable and I reviewed them with her in detail.  Continues to Herceptin. Underwent lumpectomy 01/22/18. Now currently undergoing XRT with Dr. Lisbeth Renshaw. - will complete mid February 2020. Feels good tolerating Herceptin well. No SOB or edema.    Echo today 03/07/18 EF 60-65% Personally reviewed   Echo 08/28/17 EF 60-65% GLS -23.7% Echo today EF 60-65% GLS -14.6 % (underestimated due to poor tracking.      Past Medical History:  Diagnosis Date  . Abnormal Pap smear of vagina 07/28/2016   LGSIL; colpo 07/2016 atrophic squamous cells; colpo 07/2017 - atypia  . Allergy   . Breast cancer ([place])    Metastatic Left breast  . Elevated hemoglobin A1c 07/28/2016   level - 6.1  . Endometriosis   . Low vitamin D level  07/28/2016   level 24.7    Current Outpatient Medications  Medication Sig Dispense Refill  . acyclovir ointment (ZOVIRAX) 5 % Apply 1 application topically every 4 (four) hours. (Patient not taking: Reported on 03/07/2018) 15 g 2  . Artificial Tear Solution (OPTI-TEARS OP) Place 1 drop into both eyes 3 (three) times daily as needed (for dry/irritated eyes.).    Marland Kitchen cholestyramine (QUESTRAN) 4 GM/DOSE powder Take 1 packet (4 g total) by mouth 2 (two) times daily with a meal. (Patient not taking: Reported on 03/07/2018) 378 g 2  . fluticasone (FLONASE) 50 MCG/ACT nasal spray Place 1 spray into both nostrils daily as needed for allergies.    Marland Kitchen HYDROcodone-acetaminophen (NORCO) 5-325 MG tablet 1/2 to 1 tablets Q 4 hours prn pain (Patient not taking: Reported on 03/07/2018) 30 tablet 0  . HYDROcodone-acetaminophen (NORCO) 5-325 MG tablet Take 1-2 tablets by mouth every 6 (six) hours as needed for moderate pain or severe pain. (Patient not taking: Reported on 03/07/2018) 30 tablet 0  . hydrocortisone (ANUSOL-HC) 25 MG suppository Place 1 suppository (25 mg total) rectally 2 (two) times daily. (Patient not taking: Reported on 03/07/2018) 12 suppository 0  . loperamide (IMODIUM) 2 MG capsule Take 1 capsule (2 mg total) by mouth as needed for diarrhea or loose stools. (Patient not taking: Reported on 03/07/2018) 30 capsule 0  . LORazepam (ATIVAN) 1 MG tablet Take 1 mg by mouth as needed for anxiety.    . Misc Natural  Products (OSTEO BI-FLEX ADV TRIPLE ST PO) Take 1 tablet by mouth daily after lunch.    . Multiple Vitamin (MULTIVITAMIN WITH MINERALS) TABS tablet Take 1 tablet by mouth daily after lunch.    . naproxen sodium (ALEVE) 220 MG tablet Take 220 mg by mouth 2 (two) times daily as needed (FOR PAIN.).     Marland Kitchen tetrahydrozoline (VISINE) 0.05 % ophthalmic solution Place 1 drop into both eyes 3 (three) times daily as needed (for dry eyes.).    Marland Kitchen valACYclovir (VALTREX) 500 MG tablet Take 1 tablet (500 mg total) by mouth  2 (two) times daily. (Patient not taking: Reported on 03/07/2018) 60 tablet 2   No current facility-administered medications for this encounter.     Allergies  Allergen Reactions  . Penicillins Nausea Only    Has patient had a PCN reaction causing immediate rash, facial/tongue/throat swelling, SOB or lightheadedness with hypotension: No Has patient had a PCN reaction causing severe rash involving mucus membranes or skin necrosis: No Has patient had a PCN reaction that required hospitalization: No Has patient had a PCN reaction occurring within the last 10 years: Yes--nausea & headache ONLY If all of the above answers are "NO", then may proceed with Cephalosporin use.       Social History   Socioeconomic History  . Marital status: Single    Spouse name: Not on file  . Number of children: 2  . Years of education: Not on file  . Highest education level: Not on file  Occupational History  . Not on file  Social Needs  . Financial resource strain: Not on file  . Food insecurity:    Worry: Not on file    Inability: Not on file  . Transportation needs:    Medical: Not on file    Non-medical: Not on file  Tobacco Use  . Smoking status: Never Smoker  . Smokeless tobacco: Never Used  Substance and Sexual Activity  . Alcohol use: Not Currently    Alcohol/week: 0.0 standard drinks    Frequency: Never    Comment: occasional  . Drug use: Yes    Types: Marijuana  . Sexual activity: Not Currently    Birth control/protection: Post-menopausal, Surgical    Comment: Hysterectomy  Lifestyle  . Physical activity:    Days per week: Not on file    Minutes per session: Not on file  . Stress: Not on file  Relationships  . Social connections:    Talks on phone: Not on file    Gets together: Not on file    Attends religious service: Not on file    Active member of club or organization: Not on file    Attends meetings of clubs or organizations: Not on file    Relationship status: Not on  file  . Intimate partner violence:    Fear of current or ex partner: Not on file    Emotionally abused: Not on file    Physically abused: Not on file    Forced sexual activity: Not on file  Other Topics Concern  . Not on file  Social History Narrative  . Not on file      Family History  Problem Relation Age of Onset  . Mental illness Mother   . Alcoholism Mother   . Cirrhosis Mother   . Cancer Father   . Lung cancer Father     Vitals:   03/07/18 0851  BP: 138/82  Pulse: 85  SpO2: 99%  Weight: 89.4  kg (197 lb)    PHYSICAL EXAM: General:  Well appearing. No resp difficulty HEENT: normal Neck: supple. no JVD. Carotids 2+ bilat; no bruits. No lymphadenopathy or thryomegaly appreciated. Cor: PMI nondisplaced. Regular rate & rhythm. No rubs, gallops or murmurs. R port a cath Lungs: clear Abdomen: soft, nontender, nondistended. No hepatosplenomegaly. No bruits or masses. Good bowel sounds. Extremities: no cyanosis, clubbing, rash, edema Neuro: alert & orientedx3, cranial nerves grossly intact. moves all 4 extremities w/o difficulty. Affect pleasant   ASSESSMENT & PLAN: 1. Left Breast Cancer - left breast CA 08/07/2017, both positive for a T2 N1, stage IIB invasive ductal carcinoma, grade 3, estrogen receptor weakly positive, progesterone receptor negative, but HER-2 amplified, with an MIB-1 of 80% -I reviewed echos personally. EF and Doppler parameters stable. No HF on exam. Continue Herceptin.   2. HTN - BP creeping up. Will cotninue to follow. Keep SBP < 140. Encouraged diet and exercise  Glori Bickers, MD  9:40 AM

## 2018-03-07 NOTE — Addendum Note (Signed)
Encounter addended by: Marlise Eves, RN on: 03/07/2018 9:50 AM  Actions taken: Clinical Note Signed, Order list changed, Diagnosis association updated

## 2018-03-08 ENCOUNTER — Ambulatory Visit
Admission: RE | Admit: 2018-03-08 | Discharge: 2018-03-08 | Disposition: A | Discharge status: Home / Self Care | Payer: 59 | Source: Ambulatory Visit | Attending: Radiation Oncology | Admitting: Radiation Oncology

## 2018-03-08 DIAGNOSIS — Z5112 Encounter for antineoplastic immunotherapy: Secondary | ICD-10-CM | POA: Diagnosis not present

## 2018-03-08 DIAGNOSIS — C50412 Malignant neoplasm of upper-outer quadrant of left female breast: Secondary | ICD-10-CM | POA: Diagnosis not present

## 2018-03-09 ENCOUNTER — Ambulatory Visit
Admission: RE | Admit: 2018-03-09 | Discharge: 2018-03-09 | Disposition: A | Discharge status: Home / Self Care | Payer: 59 | Source: Ambulatory Visit | Attending: Radiation Oncology | Admitting: Radiation Oncology

## 2018-03-09 DIAGNOSIS — C50412 Malignant neoplasm of upper-outer quadrant of left female breast: Secondary | ICD-10-CM | POA: Diagnosis not present

## 2018-03-09 DIAGNOSIS — Z5112 Encounter for antineoplastic immunotherapy: Secondary | ICD-10-CM | POA: Diagnosis not present

## 2018-03-09 DIAGNOSIS — Z170 Estrogen receptor positive status [ER+]: Principal | ICD-10-CM

## 2018-03-09 MED ORDER — RADIAPLEXRX EX GEL
Freq: Two times a day (BID) | CUTANEOUS | Status: DC
  Administered 2018-03-09: 17:00:00 via TOPICAL

## 2018-03-09 MED ORDER — ALRA NON-METALLIC DEODORANT (RAD-ONC)
1.0000 "application " | Freq: Once | TOPICAL | Status: AC
  Administered 2018-03-09: 1 via TOPICAL

## 2018-03-12 ENCOUNTER — Ambulatory Visit
Admission: RE | Admit: 2018-03-12 | Discharge: 2018-03-12 | Disposition: A | Discharge status: Home / Self Care | Payer: 59 | Source: Ambulatory Visit | Attending: Radiation Oncology | Admitting: Radiation Oncology

## 2018-03-12 DIAGNOSIS — C50412 Malignant neoplasm of upper-outer quadrant of left female breast: Secondary | ICD-10-CM | POA: Diagnosis not present

## 2018-03-12 DIAGNOSIS — Z5112 Encounter for antineoplastic immunotherapy: Secondary | ICD-10-CM | POA: Diagnosis not present

## 2018-03-13 ENCOUNTER — Ambulatory Visit
Admission: RE | Admit: 2018-03-13 | Discharge: 2018-03-13 | Disposition: A | Discharge status: Home / Self Care | Payer: 59 | Source: Ambulatory Visit | Attending: Radiation Oncology | Admitting: Radiation Oncology

## 2018-03-13 DIAGNOSIS — Z5112 Encounter for antineoplastic immunotherapy: Secondary | ICD-10-CM | POA: Diagnosis not present

## 2018-03-13 DIAGNOSIS — C50412 Malignant neoplasm of upper-outer quadrant of left female breast: Secondary | ICD-10-CM | POA: Diagnosis not present

## 2018-03-14 ENCOUNTER — Ambulatory Visit
Admission: RE | Admit: 2018-03-14 | Discharge: 2018-03-14 | Disposition: A | Discharge status: Home / Self Care | Payer: 59 | Source: Ambulatory Visit | Attending: Radiation Oncology | Admitting: Radiation Oncology

## 2018-03-14 ENCOUNTER — Encounter: Payer: Self-pay | Admitting: Rehabilitation

## 2018-03-14 ENCOUNTER — Ambulatory Visit: Payer: 59 | Admitting: Rehabilitation

## 2018-03-14 DIAGNOSIS — C50412 Malignant neoplasm of upper-outer quadrant of left female breast: Secondary | ICD-10-CM

## 2018-03-14 DIAGNOSIS — M25612 Stiffness of left shoulder, not elsewhere classified: Secondary | ICD-10-CM | POA: Insufficient documentation

## 2018-03-14 DIAGNOSIS — Z483 Aftercare following surgery for neoplasm: Secondary | ICD-10-CM | POA: Insufficient documentation

## 2018-03-14 DIAGNOSIS — R209 Unspecified disturbances of skin sensation: Secondary | ICD-10-CM

## 2018-03-14 DIAGNOSIS — Z170 Estrogen receptor positive status [ER+]: Principal | ICD-10-CM

## 2018-03-14 DIAGNOSIS — Z5112 Encounter for antineoplastic immunotherapy: Secondary | ICD-10-CM | POA: Diagnosis not present

## 2018-03-14 NOTE — Patient Instructions (Addendum)
  Access Code: [facility]  URL: [email or web address]  Date: 03/14/2018  Prepared by: Shan Levans   Program Notes  start with red band and 2 pound weights   Exercises  Supine Shoulder Horizontal Abduction with Resistance - 10 reps - 1-3 sets - 3 seconds hold - 1x daily - 7x weekly  Supine Shoulder External Rotation with Resistance - 10 reps - 1-3 sets - 3 sec hold - 1x daily - 7x weekly  Supine PNF D2 Flexion with Resistance - 10 reps - 1-3 sets - 3 sec hold - 1x daily - 7x weekly  Standing Shoulder Flexion to 90 Degrees with Dumbbells - 10 reps - 1-3 sets - 1x daily - 7x weekly  Standing Single Arm Shoulder Scaption with Dumbbell - 10 reps - 1-3 sets - 1x daily - 7x weekly  Doorway Pec Stretch at 90 Degrees Abduction - 3 reps - 20-30 seconds hold - 1x daily - 7x weekly  Standing Shoulder Flexion Wall Walk - 3 reps - 10 seconds hold - 1x daily - 7x weekly  Shoulder Overhead Press in Abduction with Dumbbells - 10 reps - 1-3 sets - 1x daily - 7x weekly  Standing Bicep Curls Supinated with Dumbbells - 10 reps - 1-3 sets - 1x daily - 7x weekly

## 2018-03-14 NOTE — Therapy (Signed)
[place], [place], [address] Phone: [phone]   Fax:  [phone]  Physical Therapy Treatment  Patient Details  Name: Madison Hopkins MRN: [id number] Date of Birth: [birth year] Referring Provider (PT): Dr. Dalbert Batman   Encounter Date: 03/14/2018  PT End of Session - 03/14/18 1306    Visit Number  3    Number of Visits  8    Date for PT Re-Evaluation  05/03/18    PT Start Time  1307    PT Stop Time  1337    PT Time Calculation (min)  30 min    Activity Tolerance  Patient tolerated treatment well    Behavior During Therapy  [facility] for tasks assessed/performed       Past Medical History:  Diagnosis Date  . Abnormal Pap smear of vagina 07/28/2016   LGSIL; colpo 07/2016 atrophic squamous cells; colpo 07/2017 - atypia  . Allergy   . Breast cancer ([place])    Metastatic Left breast  . Elevated hemoglobin A1c 07/28/2016   level - 6.1  . Endometriosis   . Low vitamin D level 07/28/2016   level 24.7    Past Surgical History:  Procedure Laterality Date  . ABDOMINAL HYSTERECTOMY    . BREAST LUMPECTOMY WITH RADIOACTIVE SEED AND AXILLARY LYMPH NODE DISSECTION Left 01/22/2018   Procedure: LEFT BREAST LUMPECTOMY WITH RADIOACTIVE SEED AND COMPLETE LEFT AXILLARY LYMPH NODE DISSECTION;  Surgeon: [name], MD;  Location: [place];  Service: General;  Laterality: Left;  . CESAREAN SECTION    . COLON SURGERY    . OOPHORECTOMY    . PORTACATH PLACEMENT N/A 09/06/2017   Procedure: INSERTION PORT-A-CATH;  Surgeon: [name], MD;  Location: [place];  Service: General;  Laterality: N/A;  . SMALL INTESTINE SURGERY    . SPINE SURGERY      There were no vitals filed for this visit.  Subjective Assessment - 03/14/18 1306    Subjective  I am doing well.  Had 5 sessions so far.  Still a little sore in the armpit.  I am stretching.  I feel ready to be done     Currently in Pain?  Yes    Pain Score  1      Pain Location  Axilla    Pain Orientation  Left    Pain Descriptors / Indicators  Aching    Pain Type  Surgical pain    Pain Onset  More than a month ago    Pain Frequency  Constant         [facility] PT Assessment - 03/14/18 0001      AROM   Left Shoulder Flexion  160 Degrees    Left Shoulder ABduction  158 Degrees                   [facility] Adult PT Treatment/Exercise - 03/14/18 0001      Exercises   Other Exercises   discussed importance of walking program       Shoulder Exercises: Supine   Other Supine Exercises  red band; supine scap series x 10 each per HEP      Shoulder Exercises: Standing   Other Standing Exercises  standing flexion 2#x10, scaption 2# x 10, elevation 2# x 5, bicep curls 2# x 5 per HEP             PT Education - 03/14/18 1336    Education Details  final HEP  Person(s) Educated  Patient    Methods  Explanation;Demonstration;Tactile cues;Verbal cues;Handout    Comprehension  Verbalized understanding;Returned demonstration          PT Long Term Goals - 03/14/18 1331      PT LONG TERM GOAL #1   Title  Pt will improve Lt shoulder flexion and abduction to at least 160    Status  Partially Met      PT LONG TERM GOAL #2   Title  Pt will demonstrate MMT of at least 4/5 and equal between sides    Status  Achieved      PT LONG TERM GOAL #3   Title  Pt will be ind with strength ABC and or community exercise programs     Status  Achieved      PT LONG TERM GOAL #4   Title  Pt will be able to don her shirt without limitations     Status  Achieved            Plan - 03/14/18 1337    Clinical Impression Statement  Pt doing very well.  Meeting ROM goals less degrees into abduction.  Pt is completing radiation, is back at work, and reports no significant limitations.  meeting today for HEP finalization.  Will D/C today    PT Home Exercise Plan  given post op stretches no wall walking, and standing dowel flexion on 02/08/18,  Access Code: [email or web address]     Consulted and Agree with Plan of Care  Patient       Patient will benefit from skilled therapeutic intervention in order to improve the following deficits and impairments:  Decreased skin integrity, Decreased knowledge of precautions, Decreased activity tolerance, Decreased knowledge of use of DME, Impaired UE functional use, Postural dysfunction  Visit Diagnosis: Malignant neoplasm of upper-outer quadrant of left breast in female, estrogen receptor positive ([place])  Aftercare following surgery for neoplasm  Stiffness of left shoulder, not elsewhere classified  Unspecified disturbances of skin sensation     Problem List Patient Active Problem List   Diagnosis Date Noted  . Port-A-Cath in place 11/09/2017  . Malignant neoplasm of upper-outer quadrant of left breast in female, estrogen receptor positive ([place]) 08/10/2017    Shan Levans, PT 03/14/2018, 1:38 PM  [place], [place], [address] Phone: [phone]   Fax:  [phone]  Name: Madison Hopkins MRN: [id number] Date of Birth: Jul 29, 1955  PHYSICAL THERAPY DISCHARGE SUMMARY  Visits from Start of Care: 3  Current functional level related to goals / functional outcomes: See above   Remaining deficits: See above   Education / Equipment: Final HEP Plan: Patient agrees to discharge.  Patient goals were partially met. Patient is being discharged due to being pleased with the current functional level.  ?????    Shan Levans, PT

## 2018-03-15 ENCOUNTER — Ambulatory Visit
Admission: RE | Admit: 2018-03-15 | Discharge: 2018-03-15 | Disposition: A | Discharge status: Home / Self Care | Payer: 59 | Source: Ambulatory Visit | Attending: Radiation Oncology | Admitting: Radiation Oncology

## 2018-03-15 DIAGNOSIS — Z5112 Encounter for antineoplastic immunotherapy: Secondary | ICD-10-CM | POA: Diagnosis not present

## 2018-03-15 DIAGNOSIS — C50412 Malignant neoplasm of upper-outer quadrant of left female breast: Secondary | ICD-10-CM | POA: Diagnosis not present

## 2018-03-16 ENCOUNTER — Ambulatory Visit
Admission: RE | Admit: 2018-03-16 | Discharge: 2018-03-16 | Disposition: A | Discharge status: Home / Self Care | Payer: 59 | Source: Ambulatory Visit | Attending: Radiation Oncology | Admitting: Radiation Oncology

## 2018-03-16 ENCOUNTER — Inpatient Hospital Stay: Payer: 59 | Attending: Oncology

## 2018-03-16 ENCOUNTER — Inpatient Hospital Stay: Payer: 59

## 2018-03-16 VITALS — BP 130/76 | HR 65 | Temp 98.6°F | Resp 16

## 2018-03-16 DIAGNOSIS — R209 Unspecified disturbances of skin sensation: Secondary | ICD-10-CM | POA: Insufficient documentation

## 2018-03-16 DIAGNOSIS — Z483 Aftercare following surgery for neoplasm: Secondary | ICD-10-CM | POA: Insufficient documentation

## 2018-03-16 DIAGNOSIS — C50412 Malignant neoplasm of upper-outer quadrant of left female breast: Secondary | ICD-10-CM

## 2018-03-16 DIAGNOSIS — M25612 Stiffness of left shoulder, not elsewhere classified: Secondary | ICD-10-CM | POA: Insufficient documentation

## 2018-03-16 DIAGNOSIS — C773 Secondary and unspecified malignant neoplasm of axilla and upper limb lymph nodes: Secondary | ICD-10-CM | POA: Insufficient documentation

## 2018-03-16 DIAGNOSIS — Z170 Estrogen receptor positive status [ER+]: Secondary | ICD-10-CM | POA: Insufficient documentation

## 2018-03-16 DIAGNOSIS — Z5112 Encounter for antineoplastic immunotherapy: Secondary | ICD-10-CM | POA: Insufficient documentation

## 2018-03-16 LAB — CBC WITH DIFFERENTIAL/PLATELET
Abs Immature Granulocytes: 0.01 10*3/uL (ref 0.00–0.07)
Basophils Absolute: 0 10*3/uL (ref 0.0–0.1)
Basophils Relative: 0 %
Eosinophils Absolute: 0.1 10*3/uL (ref 0.0–0.5)
Eosinophils Relative: 2 %
HCT: 34.8 % — ABNORMAL LOW (ref 36.0–46.0)
Hemoglobin: 10.8 g/dL — ABNORMAL LOW (ref 12.0–15.0)
Immature Granulocytes: 0 %
Lymphocytes Relative: 37 %
Lymphs Abs: 2.1 10*3/uL (ref 0.7–4.0)
MCH: 27.6 pg (ref 26.0–34.0)
MCHC: 31 g/dL (ref 30.0–36.0)
MCV: 88.8 fL (ref 80.0–100.0)
Monocytes Absolute: 0.5 10*3/uL (ref 0.1–1.0)
Monocytes Relative: 9 %
Neutro Abs: 2.9 10*3/uL (ref 1.7–7.7)
Neutrophils Relative %: 52 %
Platelets: 265 10*3/uL (ref 150–400)
RBC: 3.92 MIL/uL (ref 3.87–5.11)
RDW: 12.8 % (ref 11.5–15.5)
WBC: 5.6 10*3/uL (ref 4.0–10.5)
nRBC: 0 % (ref 0.0–0.2)

## 2018-03-16 LAB — COMPREHENSIVE METABOLIC PANEL
ALT: 17 U/L (ref 0–44)
AST: 13 U/L — ABNORMAL LOW (ref 15–41)
Albumin: 4.1 g/dL (ref 3.5–5.0)
Alkaline Phosphatase: 129 U/L — ABNORMAL HIGH (ref 38–126)
Anion gap: 9 (ref 5–15)
BUN: 8 mg/dL (ref 8–23)
CO2: 25 mmol/L (ref 22–32)
Calcium: 9.6 mg/dL (ref 8.9–10.3)
Chloride: 108 mmol/L (ref 98–111)
Creatinine, Ser: 0.73 mg/dL (ref 0.44–1.00)
GFR calc Af Amer: >60 mL/min (ref >60)
GFR calc non Af Amer: >60 mL/min (ref >60)
Glucose, Bld: 85 mg/dL (ref 70–99)
Potassium: 3.6 mmol/L (ref 3.5–5.1)
Sodium: 142 mmol/L (ref 135–145)
Total Bilirubin: 0.5 mg/dL (ref 0.3–1.2)
Total Protein: 7.4 g/dL (ref 6.5–8.1)

## 2018-03-16 MED ORDER — TRASTUZUMAB CHEMO 150 MG IV SOLR
6.0000 mg/kg | Freq: Once | INTRAVENOUS | Status: AC
  Administered 2018-03-16: 546 mg via INTRAVENOUS
  Filled 2018-03-16: qty 26

## 2018-03-16 MED ORDER — DIPHENHYDRAMINE HCL 25 MG PO CAPS
25.0000 mg | ORAL_CAPSULE | Freq: Once | ORAL | Status: AC
  Administered 2018-03-16: 25 mg via ORAL

## 2018-03-16 MED ORDER — HEPARIN SOD (PORK) LOCK FLUSH 100 UNIT/ML IV SOLN
500.0000 [IU] | Freq: Once | INTRAVENOUS | Status: AC | PRN
  Administered 2018-03-16: 500 [IU]
  Filled 2018-03-16: qty 5

## 2018-03-16 MED ORDER — ACETAMINOPHEN 325 MG PO TABS
650.0000 mg | ORAL_TABLET | Freq: Once | ORAL | Status: AC
  Administered 2018-03-16: 650 mg via ORAL

## 2018-03-16 MED ORDER — ACETAMINOPHEN 325 MG PO TABS
ORAL_TABLET | ORAL | Status: AC
  Filled 2018-03-16: qty 2

## 2018-03-16 MED ORDER — SODIUM CHLORIDE 0.9 % IV SOLN
Freq: Once | INTRAVENOUS | Status: AC
  Administered 2018-03-16: 13:00:00 via INTRAVENOUS
  Filled 2018-03-16: qty 250

## 2018-03-16 MED ORDER — SODIUM CHLORIDE 0.9% FLUSH
10.0000 mL | INTRAVENOUS | Status: DC | PRN
  Administered 2018-03-16: 10 mL
  Filled 2018-03-16: qty 10

## 2018-03-16 MED ORDER — DIPHENHYDRAMINE HCL 25 MG PO CAPS
ORAL_CAPSULE | ORAL | Status: AC
  Filled 2018-03-16: qty 1

## 2018-03-16 NOTE — Patient Instructions (Signed)
[facility] Discharge Instructions for Patients Receiving Chemotherapy Today you received the following chemotherapy agents:  Herceptin To help prevent nausea and vomiting after your treatment, we encourage you to take your nausea medication as prescribed.   If you develop nausea and vomiting that is not controlled by your nausea medication, call the clinic.   BELOW ARE SYMPTOMS THAT SHOULD BE REPORTED IMMEDIATELY:  *FEVER GREATER THAN 100.5 F  *CHILLS WITH OR WITHOUT FEVER  NAUSEA AND VOMITING THAT IS NOT CONTROLLED WITH YOUR NAUSEA MEDICATION  *UNUSUAL SHORTNESS OF BREATH  *UNUSUAL BRUISING OR BLEEDING  TENDERNESS IN MOUTH AND THROAT WITH OR WITHOUT PRESENCE OF ULCERS  *URINARY PROBLEMS  *BOWEL PROBLEMS  UNUSUAL RASH Items with * indicate a potential emergency and should be followed up as soon as possible.  Feel free to call the clinic should you have any questions or concerns. The clinic phone number is [phone].  Please show the CHEMO ALERT CARD at check-in to the Emergency Department and triage nurse.   

## 2018-03-19 ENCOUNTER — Ambulatory Visit
Admission: RE | Admit: 2018-03-19 | Discharge: 2018-03-19 | Disposition: A | Discharge status: Home / Self Care | Payer: 59 | Source: Ambulatory Visit | Attending: Radiation Oncology | Admitting: Radiation Oncology

## 2018-03-19 DIAGNOSIS — Z5112 Encounter for antineoplastic immunotherapy: Secondary | ICD-10-CM | POA: Diagnosis not present

## 2018-03-19 DIAGNOSIS — C50412 Malignant neoplasm of upper-outer quadrant of left female breast: Secondary | ICD-10-CM | POA: Diagnosis not present

## 2018-03-20 ENCOUNTER — Ambulatory Visit
Admission: RE | Admit: 2018-03-20 | Discharge: 2018-03-20 | Disposition: A | Discharge status: Home / Self Care | Payer: 59 | Source: Ambulatory Visit | Attending: Radiation Oncology | Admitting: Radiation Oncology

## 2018-03-20 DIAGNOSIS — C50412 Malignant neoplasm of upper-outer quadrant of left female breast: Secondary | ICD-10-CM | POA: Diagnosis not present

## 2018-03-20 DIAGNOSIS — Z5112 Encounter for antineoplastic immunotherapy: Secondary | ICD-10-CM | POA: Diagnosis not present

## 2018-03-21 ENCOUNTER — Ambulatory Visit
Admission: RE | Admit: 2018-03-21 | Discharge: 2018-03-21 | Disposition: A | Discharge status: Home / Self Care | Payer: 59 | Source: Ambulatory Visit | Attending: Radiation Oncology | Admitting: Radiation Oncology

## 2018-03-21 DIAGNOSIS — C50412 Malignant neoplasm of upper-outer quadrant of left female breast: Secondary | ICD-10-CM | POA: Diagnosis not present

## 2018-03-21 DIAGNOSIS — Z5112 Encounter for antineoplastic immunotherapy: Secondary | ICD-10-CM | POA: Diagnosis not present

## 2018-03-22 ENCOUNTER — Ambulatory Visit
Admission: RE | Admit: 2018-03-22 | Discharge: 2018-03-22 | Disposition: A | Discharge status: Home / Self Care | Payer: 59 | Source: Ambulatory Visit | Attending: Radiation Oncology | Admitting: Radiation Oncology

## 2018-03-22 DIAGNOSIS — Z5112 Encounter for antineoplastic immunotherapy: Secondary | ICD-10-CM | POA: Diagnosis not present

## 2018-03-22 DIAGNOSIS — C50412 Malignant neoplasm of upper-outer quadrant of left female breast: Secondary | ICD-10-CM | POA: Diagnosis not present

## 2018-03-23 ENCOUNTER — Ambulatory Visit
Admission: RE | Admit: 2018-03-23 | Discharge: 2018-03-23 | Disposition: A | Discharge status: Home / Self Care | Payer: 59 | Source: Ambulatory Visit | Attending: Radiation Oncology | Admitting: Radiation Oncology

## 2018-03-23 DIAGNOSIS — C50412 Malignant neoplasm of upper-outer quadrant of left female breast: Secondary | ICD-10-CM | POA: Diagnosis not present

## 2018-03-23 DIAGNOSIS — Z5112 Encounter for antineoplastic immunotherapy: Secondary | ICD-10-CM | POA: Diagnosis not present

## 2018-03-26 ENCOUNTER — Ambulatory Visit
Admission: RE | Admit: 2018-03-26 | Discharge: 2018-03-26 | Disposition: A | Discharge status: Home / Self Care | Payer: 59 | Source: Ambulatory Visit | Attending: Radiation Oncology | Admitting: Radiation Oncology

## 2018-03-26 DIAGNOSIS — Z5112 Encounter for antineoplastic immunotherapy: Secondary | ICD-10-CM | POA: Diagnosis not present

## 2018-03-26 DIAGNOSIS — C50412 Malignant neoplasm of upper-outer quadrant of left female breast: Secondary | ICD-10-CM | POA: Diagnosis not present

## 2018-03-27 ENCOUNTER — Ambulatory Visit
Admission: RE | Admit: 2018-03-27 | Discharge: 2018-03-27 | Disposition: A | Discharge status: Home / Self Care | Payer: 59 | Source: Ambulatory Visit | Attending: Radiation Oncology | Admitting: Radiation Oncology

## 2018-03-27 DIAGNOSIS — C50412 Malignant neoplasm of upper-outer quadrant of left female breast: Secondary | ICD-10-CM | POA: Diagnosis not present

## 2018-03-27 DIAGNOSIS — Z5112 Encounter for antineoplastic immunotherapy: Secondary | ICD-10-CM | POA: Diagnosis not present

## 2018-03-28 ENCOUNTER — Ambulatory Visit
Admission: RE | Admit: 2018-03-28 | Discharge: 2018-03-28 | Disposition: A | Discharge status: Home / Self Care | Payer: 59 | Source: Ambulatory Visit | Attending: Radiation Oncology | Admitting: Radiation Oncology

## 2018-03-28 DIAGNOSIS — C50412 Malignant neoplasm of upper-outer quadrant of left female breast: Secondary | ICD-10-CM | POA: Diagnosis not present

## 2018-03-28 DIAGNOSIS — Z5112 Encounter for antineoplastic immunotherapy: Secondary | ICD-10-CM | POA: Diagnosis not present

## 2018-03-29 ENCOUNTER — Ambulatory Visit
Admission: RE | Admit: 2018-03-29 | Discharge: 2018-03-29 | Disposition: A | Discharge status: Home / Self Care | Payer: 59 | Source: Ambulatory Visit | Attending: Radiation Oncology | Admitting: Radiation Oncology

## 2018-03-29 DIAGNOSIS — Z5112 Encounter for antineoplastic immunotherapy: Secondary | ICD-10-CM | POA: Diagnosis not present

## 2018-03-29 DIAGNOSIS — C50412 Malignant neoplasm of upper-outer quadrant of left female breast: Secondary | ICD-10-CM | POA: Diagnosis not present

## 2018-03-30 ENCOUNTER — Ambulatory Visit
Admission: RE | Admit: 2018-03-30 | Discharge: 2018-03-30 | Disposition: A | Discharge status: Home / Self Care | Payer: 59 | Source: Ambulatory Visit | Attending: Radiation Oncology | Admitting: Radiation Oncology

## 2018-03-30 DIAGNOSIS — Z5112 Encounter for antineoplastic immunotherapy: Secondary | ICD-10-CM | POA: Diagnosis not present

## 2018-03-30 DIAGNOSIS — C50412 Malignant neoplasm of upper-outer quadrant of left female breast: Secondary | ICD-10-CM | POA: Diagnosis not present

## 2018-04-02 ENCOUNTER — Ambulatory Visit
Admission: RE | Admit: 2018-04-02 | Discharge: 2018-04-02 | Disposition: A | Discharge status: Home / Self Care | Payer: 59 | Source: Ambulatory Visit | Attending: Radiation Oncology | Admitting: Radiation Oncology

## 2018-04-02 DIAGNOSIS — Z170 Estrogen receptor positive status [ER+]: Secondary | ICD-10-CM

## 2018-04-02 DIAGNOSIS — C773 Secondary and unspecified malignant neoplasm of axilla and upper limb lymph nodes: Secondary | ICD-10-CM | POA: Diagnosis not present

## 2018-04-02 DIAGNOSIS — C50412 Malignant neoplasm of upper-outer quadrant of left female breast: Secondary | ICD-10-CM | POA: Insufficient documentation

## 2018-04-02 DIAGNOSIS — Z5112 Encounter for antineoplastic immunotherapy: Secondary | ICD-10-CM | POA: Diagnosis not present

## 2018-04-03 ENCOUNTER — Ambulatory Visit
Admission: RE | Admit: 2018-04-03 | Discharge: 2018-04-03 | Disposition: A | Discharge status: Home / Self Care | Payer: 59 | Source: Ambulatory Visit | Attending: Radiation Oncology | Admitting: Radiation Oncology

## 2018-04-03 DIAGNOSIS — C50412 Malignant neoplasm of upper-outer quadrant of left female breast: Secondary | ICD-10-CM | POA: Diagnosis not present

## 2018-04-03 DIAGNOSIS — Z5112 Encounter for antineoplastic immunotherapy: Secondary | ICD-10-CM | POA: Diagnosis not present

## 2018-04-04 ENCOUNTER — Ambulatory Visit
Admission: RE | Admit: 2018-04-04 | Discharge: 2018-04-04 | Disposition: A | Discharge status: Home / Self Care | Payer: 59 | Source: Ambulatory Visit | Attending: Radiation Oncology | Admitting: Radiation Oncology

## 2018-04-04 DIAGNOSIS — Z5112 Encounter for antineoplastic immunotherapy: Secondary | ICD-10-CM | POA: Diagnosis not present

## 2018-04-04 DIAGNOSIS — C50412 Malignant neoplasm of upper-outer quadrant of left female breast: Secondary | ICD-10-CM | POA: Diagnosis not present

## 2018-04-04 NOTE — Progress Notes (Signed)
Per Dr. Baruch Gouty, RN, plan is for patient to receive herceptin on 2/7. MD is also planning on adding pembrolizumab to treatment plan, but if PA is not completed on 2/7, only give herceptin 2/7.   Demetrius Charity, PharmD, [facility] Pharmacist Pharmacy Phone: [phone] 04/04/2018

## 2018-04-05 ENCOUNTER — Ambulatory Visit
Admission: RE | Admit: 2018-04-05 | Discharge: 2018-04-05 | Disposition: A | Discharge status: Home / Self Care | Payer: 59 | Source: Ambulatory Visit | Attending: Radiation Oncology | Admitting: Radiation Oncology

## 2018-04-05 DIAGNOSIS — Z5112 Encounter for antineoplastic immunotherapy: Secondary | ICD-10-CM | POA: Diagnosis not present

## 2018-04-05 DIAGNOSIS — C50412 Malignant neoplasm of upper-outer quadrant of left female breast: Secondary | ICD-10-CM | POA: Diagnosis not present

## 2018-04-06 ENCOUNTER — Inpatient Hospital Stay: Payer: 59

## 2018-04-06 ENCOUNTER — Inpatient Hospital Stay: Payer: 59 | Attending: Adult Health

## 2018-04-06 ENCOUNTER — Ambulatory Visit
Admission: RE | Admit: 2018-04-06 | Discharge: 2018-04-06 | Disposition: A | Discharge status: Home / Self Care | Payer: 59 | Source: Ambulatory Visit | Attending: Radiation Oncology | Admitting: Radiation Oncology

## 2018-04-06 ENCOUNTER — Other Ambulatory Visit: Payer: Self-pay | Admitting: Oncology

## 2018-04-06 VITALS — BP 138/74 | HR 67 | Temp 97.9°F | Resp 16

## 2018-04-06 DIAGNOSIS — Z5112 Encounter for antineoplastic immunotherapy: Secondary | ICD-10-CM | POA: Insufficient documentation

## 2018-04-06 DIAGNOSIS — C50412 Malignant neoplasm of upper-outer quadrant of left female breast: Secondary | ICD-10-CM

## 2018-04-06 DIAGNOSIS — Z170 Estrogen receptor positive status [ER+]: Secondary | ICD-10-CM

## 2018-04-06 DIAGNOSIS — C773 Secondary and unspecified malignant neoplasm of axilla and upper limb lymph nodes: Secondary | ICD-10-CM | POA: Insufficient documentation

## 2018-04-06 LAB — CBC WITH DIFFERENTIAL/PLATELET
Abs Immature Granulocytes: 0.01 10*3/uL (ref 0.00–0.07)
Basophils Absolute: 0 10*3/uL (ref 0.0–0.1)
Basophils Relative: 0 %
Eosinophils Absolute: 0.1 10*3/uL (ref 0.0–0.5)
Eosinophils Relative: 2 %
HCT: 35.2 % — ABNORMAL LOW (ref 36.0–46.0)
Hemoglobin: 11.1 g/dL — ABNORMAL LOW (ref 12.0–15.0)
Immature Granulocytes: 0 %
Lymphocytes Relative: 35 %
Lymphs Abs: 1.9 10*3/uL (ref 0.7–4.0)
MCH: 27.6 pg (ref 26.0–34.0)
MCHC: 31.5 g/dL (ref 30.0–36.0)
MCV: 87.6 fL (ref 80.0–100.0)
Monocytes Absolute: 0.5 10*3/uL (ref 0.1–1.0)
Monocytes Relative: 10 %
Neutro Abs: 2.9 10*3/uL (ref 1.7–7.7)
Neutrophils Relative %: 53 %
Platelets: 284 10*3/uL (ref 150–400)
RBC: 4.02 MIL/uL (ref 3.87–5.11)
RDW: 12.9 % (ref 11.5–15.5)
WBC: 5.4 10*3/uL (ref 4.0–10.5)
nRBC: 0 % (ref 0.0–0.2)

## 2018-04-06 LAB — COMPREHENSIVE METABOLIC PANEL
ALT: 15 U/L (ref 0–44)
AST: 15 U/L (ref 15–41)
Albumin: 4.1 g/dL (ref 3.5–5.0)
Alkaline Phosphatase: 124 U/L (ref 38–126)
Anion gap: 10 (ref 5–15)
BUN: 9 mg/dL (ref 8–23)
CO2: 25 mmol/L (ref 22–32)
Calcium: 9.8 mg/dL (ref 8.9–10.3)
Chloride: 107 mmol/L (ref 98–111)
Creatinine, Ser: 0.81 mg/dL (ref 0.44–1.00)
GFR calc Af Amer: >60 mL/min (ref >60)
GFR calc non Af Amer: >60 mL/min (ref >60)
Glucose, Bld: 74 mg/dL (ref 70–99)
Potassium: 3.6 mmol/L (ref 3.5–5.1)
Sodium: 142 mmol/L (ref 135–145)
Total Bilirubin: 0.5 mg/dL (ref 0.3–1.2)
Total Protein: 7.5 g/dL (ref 6.5–8.1)

## 2018-04-06 MED ORDER — TRASTUZUMAB CHEMO 150 MG IV SOLR
6.0000 mg/kg | Freq: Once | INTRAVENOUS | Status: AC
  Administered 2018-04-06: 546 mg via INTRAVENOUS
  Filled 2018-04-06: qty 26

## 2018-04-06 MED ORDER — HEPARIN SOD (PORK) LOCK FLUSH 100 UNIT/ML IV SOLN
500.0000 [IU] | Freq: Once | INTRAVENOUS | Status: AC | PRN
  Administered 2018-04-06: 500 [IU]
  Filled 2018-04-06: qty 5

## 2018-04-06 MED ORDER — DIPHENHYDRAMINE HCL 25 MG PO CAPS
ORAL_CAPSULE | ORAL | Status: AC
  Filled 2018-04-06: qty 1

## 2018-04-06 MED ORDER — SODIUM CHLORIDE 0.9% FLUSH
10.0000 mL | INTRAVENOUS | Status: DC | PRN
  Administered 2018-04-06: 10 mL
  Filled 2018-04-06: qty 10

## 2018-04-06 MED ORDER — SODIUM CHLORIDE 0.9 % IV SOLN
Freq: Once | INTRAVENOUS | Status: AC
  Administered 2018-04-06: 15:00:00 via INTRAVENOUS
  Filled 2018-04-06: qty 250

## 2018-04-06 MED ORDER — ACETAMINOPHEN 325 MG PO TABS
650.0000 mg | ORAL_TABLET | Freq: Once | ORAL | Status: AC
  Administered 2018-04-06: 650 mg via ORAL

## 2018-04-06 MED ORDER — ACETAMINOPHEN 325 MG PO TABS
ORAL_TABLET | ORAL | Status: AC
  Filled 2018-04-06: qty 2

## 2018-04-06 MED ORDER — DIPHENHYDRAMINE HCL 25 MG PO CAPS
25.0000 mg | ORAL_CAPSULE | Freq: Once | ORAL | Status: AC
  Administered 2018-04-06: 25 mg via ORAL

## 2018-04-06 NOTE — Patient Instructions (Signed)
[facility] Discharge Instructions for Patients Receiving Chemotherapy Today you received the following chemotherapy agents:  Herceptin To help prevent nausea and vomiting after your treatment, we encourage you to take your nausea medication as prescribed.   If you develop nausea and vomiting that is not controlled by your nausea medication, call the clinic.   BELOW ARE SYMPTOMS THAT SHOULD BE REPORTED IMMEDIATELY:  *FEVER GREATER THAN 100.5 F  *CHILLS WITH OR WITHOUT FEVER  NAUSEA AND VOMITING THAT IS NOT CONTROLLED WITH YOUR NAUSEA MEDICATION  *UNUSUAL SHORTNESS OF BREATH  *UNUSUAL BRUISING OR BLEEDING  TENDERNESS IN MOUTH AND THROAT WITH OR WITHOUT PRESENCE OF ULCERS  *URINARY PROBLEMS  *BOWEL PROBLEMS  UNUSUAL RASH Items with * indicate a potential emergency and should be followed up as soon as possible.  Feel free to call the clinic should you have any questions or concerns. The clinic phone number is [phone].  Please show the CHEMO ALERT CARD at check-in to the Emergency Department and triage nurse.   

## 2018-04-09 ENCOUNTER — Other Ambulatory Visit: Payer: Self-pay | Admitting: Oncology

## 2018-04-09 ENCOUNTER — Ambulatory Visit
Admission: RE | Admit: 2018-04-09 | Discharge: 2018-04-09 | Disposition: A | Discharge status: Home / Self Care | Payer: 59 | Source: Ambulatory Visit | Attending: Radiation Oncology | Admitting: Radiation Oncology

## 2018-04-09 DIAGNOSIS — Z5112 Encounter for antineoplastic immunotherapy: Secondary | ICD-10-CM | POA: Diagnosis not present

## 2018-04-09 DIAGNOSIS — C50412 Malignant neoplasm of upper-outer quadrant of left female breast: Secondary | ICD-10-CM | POA: Diagnosis not present

## 2018-04-10 ENCOUNTER — Ambulatory Visit
Admission: RE | Admit: 2018-04-10 | Discharge: 2018-04-10 | Disposition: A | Discharge status: Home / Self Care | Payer: 59 | Source: Ambulatory Visit | Attending: Radiation Oncology | Admitting: Radiation Oncology

## 2018-04-10 DIAGNOSIS — C50412 Malignant neoplasm of upper-outer quadrant of left female breast: Secondary | ICD-10-CM | POA: Diagnosis not present

## 2018-04-10 DIAGNOSIS — Z5112 Encounter for antineoplastic immunotherapy: Secondary | ICD-10-CM | POA: Diagnosis not present

## 2018-04-11 ENCOUNTER — Ambulatory Visit: Payer: 59 | Admitting: Radiation Oncology

## 2018-04-11 ENCOUNTER — Ambulatory Visit: Payer: 59

## 2018-04-12 ENCOUNTER — Ambulatory Visit
Admission: RE | Admit: 2018-04-12 | Discharge: 2018-04-12 | Disposition: A | Discharge status: Home / Self Care | Payer: 59 | Source: Ambulatory Visit | Attending: Radiation Oncology | Admitting: Radiation Oncology

## 2018-04-12 ENCOUNTER — Other Ambulatory Visit: Payer: Self-pay | Admitting: Oncology

## 2018-04-12 DIAGNOSIS — Z5112 Encounter for antineoplastic immunotherapy: Secondary | ICD-10-CM | POA: Diagnosis not present

## 2018-04-12 DIAGNOSIS — C50412 Malignant neoplasm of upper-outer quadrant of left female breast: Secondary | ICD-10-CM | POA: Diagnosis not present

## 2018-04-12 NOTE — Progress Notes (Unsigned)
[place]  [phone] [phone]     ID: Madison Hopkins DOB: [birth year]  MR#: [id number]  WEX#:[id number]  Patient Care Team: Donzetta Kohut as PCP - General (Physician Assistant) Aylen Rambert, [name] Dad, MD as Consulting Physician (Oncology) Kyung Rudd, MD as Consulting Physician (Radiation Oncology) [name], [name] All, MD as Consulting Physician (Obstetrics and Gynecology) Gentry Fitz, MD as Consulting Physician (Family Medicine) [name], MD as Consulting Physician (General Surgery) OTHER MD:  CHIEF COMPLAINT: HER-2 positive breast cancer  CURRENT TREATMENT: adjuvant Trastuzumab, radiation pending  INTERVAL HISTORY: Madison Hopkins returns today for follow-up of her HER-2 positive, weakly estrogen receptor positive breast cancer accompanied by her friend from [place].  She is doing well today.  Since her last visit she underwent a left breast lumpectomy that showed a residual 0.9cm grade 3 invasive ductal carcinoma, and 1 out of 11 lymph nodes were positive.  She is improving daily since surgery, and is looking forward to having her drain out hopefully next week.    REVIEW OF SYSTEMS: Madison Hopkins is feeling well since completing her surgery on 01/22/2018.  She doe shave some left arm soreness and is taking Advil as needed.  She is doing the exercises Dr. Dalbert Batman gave her to do.  Madison Hopkins wants to know when she can return to work.  She works as a Scientist, water quality at [employer].  She works 8 hours per day, 5 days per week.  She remains slightly fatigued.  Otherwise she is feeling well today and a detailed ROS was non contributory.    HISTORY OF CURRENT ILLNESS: From the original intake note:  Madison Hopkins noted a mass in the left axilla sometime in January or February 2019.  She eventually brought her to her gynecologist's attention, and underwent bilateral diagnostic mammography with tomography and left breast ultrasonography  at [facility] on 08/04/2017 showing: breast density category B. There is a highly suspicious hypoechoic mass in the left breast at the 2 o'clock upper outer quadrant measuring 2.3 x 1.6 x 2.2 cm, located 2 cm from the nipple. Sonographically, there were 2 enlarged lymph nodes in the left axilla, the largest with cortical thickening measuring 2.5 cm. No evidence of malignancy was seen in the right breast.   Accordingly on 08/07/2017 she proceeded to biopsy of the left breast area and 1 of the lymph nodes in question. The pathology from this procedure showed ([id number]): Invasive ductal carcinoma, grade 3. Metastatic carcinoma was found in one left axillary lymph node. Prognostic indicators significant for: estrogen receptor, 30% positive with weak staining intensity and progesterone receptor, 0% negative. Proliferation marker Ki67 at 80%. HER2 amplified with ratios HER2/CEP17 signals 2.32 and average HER2 copies per cell 6.60  The patient's subsequent history is as detailed below.   PAST MEDICAL HISTORY: Past Medical History:  Diagnosis Date  . Abnormal Pap smear of vagina 07/28/2016   LGSIL; colpo 07/2016 atrophic squamous cells; colpo 07/2017 - atypia  . Allergy   . Breast cancer ([place])    Metastatic Left breast  . Elevated hemoglobin A1c 07/28/2016   level - 6.1  . Endometriosis   . Low vitamin D level 07/28/2016   level 24.7  GERD but no ulcers  PAST SURGICAL HISTORY: Past Surgical History:  Procedure Laterality Date  . ABDOMINAL HYSTERECTOMY    . BREAST LUMPECTOMY WITH RADIOACTIVE SEED AND AXILLARY LYMPH NODE DISSECTION Left 01/22/2018   Procedure: LEFT BREAST LUMPECTOMY WITH RADIOACTIVE SEED AND  COMPLETE LEFT AXILLARY LYMPH NODE DISSECTION;  Surgeon: [name], MD;  Location: [facility];  Service: General;  Laterality: Left;  . CESAREAN SECTION    . COLON SURGERY    . OOPHORECTOMY    . PORTACATH PLACEMENT N/A 09/06/2017   Procedure: INSERTION PORT-A-CATH;   Surgeon: [name], MD;  Location: [facility];  Service: General;  Laterality: N/A;  . SMALL INTESTINE SURGERY    . SPINE SURGERY    Hysterectomy with salpingo-oophorectomy, Tonsillectomy, Plantar Fascitis Right Foot Surgery    FAMILY HISTORY Family History  Problem Relation Age of Onset  . Mental illness Mother   . Alcoholism Mother   . Cirrhosis Mother   . Cancer Father   . Lung cancer Father   The patient' father died at age 6 due to lung cancer (heavy smoker). The patient's mother died due to liver cirrhosis (heavy drinker). The patient has 2 brothers and no sisters. There was a paternal 1st cousin with colon cancer diagnosed in the mid 40's, who also had cervical cancer. The mother of this 1st cousin (the patient's paternal aunt) had cancer (the patient's is unsure of what type). There was also a paternal uncle with prostate cancer diagnosed in the 69's. The patient denies a family history of breast or ovarian cancer.     GYNECOLOGIC HISTORY:  No LMP recorded (lmp unknown). Patient has had a hysterectomy. Menarche: 63 years old Age at first live birth: 63 years old She is GXP2.  The patient is status post total hysterectomy with bilateral salpingo-oophorectomy in 1995.  She never used contraception. She notes that she had an estrogen shot one time but no other HRTs.    SOCIAL HISTORY:  The patient works in Therapist, art for the tax department. She is single. At home is herself and no pets. Her son, Madison Hopkins is age 62 and lives in [place], [place] as a Musician. The patient's daughter Madison Hopkins is age 44 and lives in [place] in Therapist, art for [employer]. The patient has 5 grandchildren and 1 great grandchild. She attends [employer].     ADVANCED DIRECTIVES: Not in place; at the 08/16/2017 visit the patient was given the appropriate forms to complete on notarized at her discretion   HEALTH MAINTENANCE: Social History   Tobacco Use  . Smoking status: Never Smoker   . Smokeless tobacco: Never Used  Substance Use Topics  . Alcohol use: Not Currently    Alcohol/week: 0.0 standard drinks    Frequency: Never    Comment: occasional  . Drug use: Yes    Types: Marijuana     Colonoscopy: 2009?  PAP: 07/31/2017/ positive for Atypical Squamous cells of uncertain significance.   Bone density: 10/24/2016 showed a T score of -0.7 (normal was (   Allergies  Allergen Reactions  . Penicillins Nausea Only    Has patient had a PCN reaction causing immediate rash, facial/tongue/throat swelling, SOB or lightheadedness with hypotension: No Has patient had a PCN reaction causing severe rash involving mucus membranes or skin necrosis: No Has patient had a PCN reaction that required hospitalization: No Has patient had a PCN reaction occurring within the last 10 years: Yes--nausea & headache ONLY If all of the above answers are "NO", then may proceed with Cephalosporin use.     Current Outpatient Medications  Medication Sig Dispense Refill  . acyclovir ointment (ZOVIRAX) 5 % Apply 1 application topically every 4 (four) hours. (Patient not taking: Reported on 03/07/2018) 15  g 2  . Artificial Tear Solution (OPTI-TEARS OP) Place 1 drop into both eyes 3 (three) times daily as needed (for dry/irritated eyes.).    Marland Kitchen cholestyramine (QUESTRAN) 4 GM/DOSE powder Take 1 packet (4 g total) by mouth 2 (two) times daily with a meal. (Patient not taking: Reported on 03/07/2018) 378 g 2  . fluticasone (FLONASE) 50 MCG/ACT nasal spray Place 1 spray into both nostrils daily as needed for allergies.    Marland Kitchen HYDROcodone-acetaminophen (NORCO) 5-325 MG tablet 1/2 to 1 tablets Q 4 hours prn pain (Patient not taking: Reported on 03/07/2018) 30 tablet 0  . HYDROcodone-acetaminophen (NORCO) 5-325 MG tablet Take 1-2 tablets by mouth every 6 (six) hours as needed for moderate pain or severe pain. (Patient not taking: Reported on 03/07/2018) 30 tablet 0  . hydrocortisone (ANUSOL-HC) 25 MG suppository Place  1 suppository (25 mg total) rectally 2 (two) times daily. (Patient not taking: Reported on 03/07/2018) 12 suppository 0  . loperamide (IMODIUM) 2 MG capsule Take 1 capsule (2 mg total) by mouth as needed for diarrhea or loose stools. (Patient not taking: Reported on 03/07/2018) 30 capsule 0  . LORazepam (ATIVAN) 1 MG tablet Take 1 mg by mouth as needed for anxiety.    . Misc Natural Products (OSTEO BI-FLEX ADV TRIPLE ST PO) Take 1 tablet by mouth daily after lunch.    . Multiple Vitamin (MULTIVITAMIN WITH MINERALS) TABS tablet Take 1 tablet by mouth daily after lunch.    . naproxen sodium (ALEVE) 220 MG tablet Take 220 mg by mouth 2 (two) times daily as needed (FOR PAIN.).     Marland Kitchen tetrahydrozoline (VISINE) 0.05 % ophthalmic solution Place 1 drop into both eyes 3 (three) times daily as needed (for dry eyes.).    Marland Kitchen valACYclovir (VALTREX) 500 MG tablet Take 1 tablet (500 mg total) by mouth 2 (two) times daily. (Patient not taking: Reported on 03/07/2018) 60 tablet 2   No current facility-administered medications for this visit.     OBJECTIVE:  There were no vitals filed for this visit.   There is no height or weight on file to calculate BMI.   Wt Readings from Last 3 Encounters:  03/07/18 197 lb (89.4 kg)  02/13/18 193 lb 2 oz (87.6 kg)  02/13/18 193 lb 3.2 oz (87.6 kg)  ECOG FS:1 - Symptomatic but completely ambulatory GENERAL: Patient is a well appearing female in no acute distress HEENT:  Sclerae anicteric.  Oropharynx clear and moist. No ulcerations or evidence of oropharyngeal candidiasis. Neck is supple.  NODES:  No cervical, supraclavicular, or axillary lymphadenopathy palpated.  BREAST EXAM:  S/p left lumpectomy, covered by ABD pad, site is healing well.  No sign of infection LUNGS:  Clear to auscultation bilaterally.  No wheezes or rhonchi. HEART:  Regular rate and rhythm. No murmur appreciated. ABDOMEN:  Soft, nontender.  Positive, normoactive bowel sounds. No organomegaly palpated. MSK:   No focal spinal tenderness to palpation. Full range of motion bilaterally in the upper extremities. EXTREMITIES:  No peripheral edema.   SKIN:  Clear with no obvious rashes or skin changes. No nail dyscrasia. NEURO:  Nonfocal. Well oriented.  Appropriate affect.    LAB RESULTS:  CMP     Component Value Date/Time   NA 142 04/06/2018 1414   K 3.6 04/06/2018 1414   CL 107 04/06/2018 1414   CO2 25 04/06/2018 1414   GLUCOSE 74 04/06/2018 1414   BUN 9 04/06/2018 1414   CREATININE 0.81 04/06/2018 1414  CREATININE 0.81 10/25/2017 1055   CALCIUM 9.8 04/06/2018 1414   PROT 7.5 04/06/2018 1414   ALBUMIN 4.1 04/06/2018 1414   AST 15 04/06/2018 1414   AST 23 10/25/2017 1055   ALT 15 04/06/2018 1414   ALT 30 10/25/2017 1055   ALKPHOS 124 04/06/2018 1414   BILITOT 0.5 04/06/2018 1414   BILITOT 0.6 10/25/2017 1055   GFRNONAA >60 04/06/2018 1414   GFRNONAA >60 10/25/2017 1055   GFRAA >60 04/06/2018 1414   GFRAA >60 10/25/2017 1055    No results found for: TOTALPROTELP, ALBUMINELP, A1GS, A2GS, BETS, BETA2SER, GAMS, MSPIKE, SPEI  No results found for: KPAFRELGTCHN, LAMBDASER, KAPLAMBRATIO  Lab Results  Component Value Date   WBC 5.4 04/06/2018   NEUTROABS 2.9 04/06/2018   HGB 11.1 (L) 04/06/2018   HCT 35.2 (L) 04/06/2018   MCV 87.6 04/06/2018   PLT 284 04/06/2018    _0 @  No results found for: LABCA2  No components found for: [id number]  No results for input(s): INR in the last 168 hours.  No results found for: LABCA2  No results found for: WEX937  No results found for: JIR678  No results found for: LFY101  No results found for: CA2729  No components found for: HGQUANT  No results found for: CEA1 / No results found for: CEA1   No results found for: AFPTUMOR  No results found for: CHROMOGRNA  No results found for: PSA1  No visits with results within 3 Day(s) from this visit.  Latest known visit with results is:  Appointment on 04/06/2018    Component Date Value Ref Range Status  . Sodium 04/06/2018 142  135 - 145 mmol/L Final  . Potassium 04/06/2018 3.6  3.5 - 5.1 mmol/L Final  . Chloride 04/06/2018 107  98 - 111 mmol/L Final  . CO2 04/06/2018 25  22 - 32 mmol/L Final  . Glucose, Bld 04/06/2018 74  70 - 99 mg/dL Final  . BUN 04/06/2018 9  8 - 23 mg/dL Final  . Creatinine, Ser 04/06/2018 0.81  0.44 - 1.00 mg/dL Final  . Calcium 04/06/2018 9.8  8.9 - 10.3 mg/dL Final  . Total Protein 04/06/2018 7.5  6.5 - 8.1 g/dL Final  . Albumin 04/06/2018 4.1  3.5 - 5.0 g/dL Final  . AST 04/06/2018 15  15 - 41 U/L Final  . ALT 04/06/2018 15  0 - 44 U/L Final  . Alkaline Phosphatase 04/06/2018 124  38 - 126 U/L Final  . Total Bilirubin 04/06/2018 0.5  0.3 - 1.2 mg/dL Final  . GFR calc non Af Amer 04/06/2018 >60  >60 mL/min Final  . GFR calc Af Amer 04/06/2018 >60  >60 mL/min Final  . Anion gap 04/06/2018 10  5 - 15 Final   Performed at [facility], [place] [address] [place], [place] [address]  . WBC 04/06/2018 5.4  4.0 - 10.5 K/uL Final  . RBC 04/06/2018 4.02  3.87 - 5.11 MIL/uL Final  . Hemoglobin 04/06/2018 11.1* 12.0 - 15.0 g/dL Final  . HCT 04/06/2018 35.2* 36.0 - 46.0 % Final  . MCV 04/06/2018 87.6  80.0 - 100.0 fL Final  . MCH 04/06/2018 27.6  26.0 - 34.0 pg Final  . MCHC 04/06/2018 31.5  30.0 - 36.0 g/dL Final  . RDW 04/06/2018 12.9  11.5 - 15.5 % Final  . Platelets 04/06/2018 284  150 - 400 K/uL Final  . nRBC 04/06/2018 0.0  0.0 - 0.2 % Final  . Neutrophils Relative %  04/06/2018 53  % Final  . Neutro Abs 04/06/2018 2.9  1.7 - 7.7 K/uL Final  . Lymphocytes Relative 04/06/2018 35  % Final  . Lymphs Abs 04/06/2018 1.9  0.7 - 4.0 K/uL Final  . Monocytes Relative 04/06/2018 10  % Final  . Monocytes Absolute 04/06/2018 0.5  0.1 - 1.0 K/uL Final  . Eosinophils Relative 04/06/2018 2  % Final  . Eosinophils Absolute 04/06/2018 0.1  0.0 - 0.5 K/uL Final  . Basophils Relative 04/06/2018 0  % Final  .  Basophils Absolute 04/06/2018 0.0  0.0 - 0.1 K/uL Final  . Immature Granulocytes 04/06/2018 0  % Final  . Abs Immature Granulocytes 04/06/2018 0.01  0.00 - 0.07 K/uL Final   Performed at [facility], [address] [place], [place] [address]    (this displays the last labs from the last 3 days)  No results found for: TOTALPROTELP, ALBUMINELP, A1GS, A2GS, BETS, BETA2SER, GAMS, MSPIKE, SPEI (this displays SPEP labs)  No results found for: KPAFRELGTCHN, LAMBDASER, KAPLAMBRATIO (kappa/lambda light chains)  No results found for: HGBA, HGBA2QUANT, HGBFQUANT, HGBSQUAN (Hemoglobinopathy evaluation)   No results found for: LDH  No results found for: IRON, TIBC, IRONPCTSAT (Iron and TIBC)  No results found for: FERRITIN  Urinalysis    Component Value Date/Time   BILIRUBINUR negative 10/07/2017 0939   BILIRUBINUR n 08/25/2016 1407   KETONESUR negative 10/07/2017 0939   PROTEINUR =30 (A) 10/07/2017 0939   PROTEINUR n 08/25/2016 1407   UROBILINOGEN 1.0 10/07/2017 0939   NITRITE Positive (A) 10/07/2017 0939   NITRITE n 08/25/2016 1407   LEUKOCYTESUR Negative 10/07/2017 0939     STUDIES: No results found.  ELIGIBLE FOR AVAILABLE RESEARCH PROTOCOL: yes (see Research Studies)  ASSESSMENT: 63 y.o. [place], [place] woman status post left breast upper outer quadrant and left axillary lymph node biopsy 08/07/2017, both positive for a T2 N1, stage IIB invasive ductal carcinoma, grade 3, estrogen receptor weakly positive, progesterone receptor negative, but HER-2 amplified, with an MIB-1 of 80%  (a) staging chest CT and bone scan 08/30/2017 showed no evidence of metastatic disease  (1) neoadjuvant chemotherapy will consist of carboplatin, docetaxel, trastuzumab, and Pertuzumab given every 21 days x 6 starting 09/07/2017, perjeta omitted with cycle 2 and 3 due to diarrhea  (a) Gemcitabine substituted for Docetaxel beginning with cycle 5 and 6 for concerns of  neuropathy  (2) trastuzumab will be continued to complete 6 months  (a) echocardiogram 08/28/2017 showed an ejection fraction in the 60-65% range.  (b) echocardiogram on 11/29/2017 showed an EF of 55-60%  (3) Left lumpectomy on 01/22/2018 shows a ypT1b pN1a, grade 3 residual invasive ductal carcinoma, agnostic panel HER-2 negative, ER 40% weakly positive, PR negative.   (a) foundation one testing requested on 02/02/2018  (b) total of 11 axillary lymph nodes removed (1+)  (4) adjuvant radiation pending  (5) will start antiestrogens at the completion of local therapy  PLAN:  Madison Hopkins is doing well today. Her labs are stable and I reviewed those with her in detail.  Her lumpectomy site is healing well.  She met with Dr. Jana Hakim and we reviewed her pathology results in detail.  She will continue on adjuvant Trastuzumab.  A referral to Dr. Lisbeth Renshaw has been placed, and Madison Hopkins will get that scheduled today.  We will send Althia's pathology results for foundation one testing and that request has been made.    Madison Hopkins will return every three weeks for labs and Trastuzumab, she will see  Dr. Jana Hakim in February after radiation and with the Trastuzumab that is due that day.  She knows to call between now and her next appointment for any questions or concerns.  Wilber Bihari, NP  04/12/18 9:26 AM Medical Oncology and Hematology [facility] [address] [place], [place] [address] Tel. [phone]    Fax. [phone]   ADDENDUM: I met with Madison Hopkins to review her pathology.  Unfortunately there was residual disease both in the breast and in 1 lymph node.  This indicates a significant risk of relapse.  I would like to intensify her treatment but she does not qualify for our pembrolizumab study or for the [place] study.  Certainly we will continue antiestrogens to a total of 7-10 years.  Also we will request a foundation 1 and PD-L1 testing on this material.  If positive we will consider  adding pembrolizumab for 2 years.  I personally saw this patient and performed a substantive portion of this encounter with the listed APP documented above.   Chauncey Cruel, MD Medical Oncology and Hematology [facility] [address] [place], [place] [address] Tel. [phone]    Fax. [phone]

## 2018-04-13 ENCOUNTER — Ambulatory Visit: Payer: 59

## 2018-04-13 ENCOUNTER — Encounter: Payer: Self-pay | Admitting: Pharmacy Technician

## 2018-04-13 DIAGNOSIS — Z5112 Encounter for antineoplastic immunotherapy: Secondary | ICD-10-CM | POA: Diagnosis not present

## 2018-04-13 DIAGNOSIS — C50412 Malignant neoplasm of upper-outer quadrant of left female breast: Secondary | ICD-10-CM | POA: Diagnosis not present

## 2018-04-13 NOTE — Progress Notes (Signed)
The patient is approved for drug assistance by [employer] for [employer]. Enrollment is effective until 04/14/19 and is based on off label use. Currently there are no orders on the schedule for Keytruda.

## 2018-04-16 ENCOUNTER — Ambulatory Visit: Payer: 59

## 2018-04-16 ENCOUNTER — Ambulatory Visit
Admission: RE | Admit: 2018-04-16 | Discharge: 2018-04-16 | Disposition: A | Discharge status: Home / Self Care | Payer: 59 | Source: Ambulatory Visit | Attending: Radiation Oncology | Admitting: Radiation Oncology

## 2018-04-16 DIAGNOSIS — Z5112 Encounter for antineoplastic immunotherapy: Secondary | ICD-10-CM | POA: Diagnosis not present

## 2018-04-16 DIAGNOSIS — C50412 Malignant neoplasm of upper-outer quadrant of left female breast: Secondary | ICD-10-CM | POA: Diagnosis not present

## 2018-04-17 ENCOUNTER — Ambulatory Visit
Admission: RE | Admit: 2018-04-17 | Discharge: 2018-04-17 | Disposition: A | Discharge status: Home / Self Care | Payer: 59 | Source: Ambulatory Visit | Attending: Radiation Oncology | Admitting: Radiation Oncology

## 2018-04-17 DIAGNOSIS — Z5112 Encounter for antineoplastic immunotherapy: Secondary | ICD-10-CM | POA: Diagnosis not present

## 2018-04-17 DIAGNOSIS — C50412 Malignant neoplasm of upper-outer quadrant of left female breast: Secondary | ICD-10-CM | POA: Diagnosis not present

## 2018-04-18 ENCOUNTER — Ambulatory Visit
Admission: RE | Admit: 2018-04-18 | Discharge: 2018-04-18 | Disposition: A | Discharge status: Home / Self Care | Payer: 59 | Source: Ambulatory Visit | Attending: Radiation Oncology | Admitting: Radiation Oncology

## 2018-04-18 DIAGNOSIS — Z5112 Encounter for antineoplastic immunotherapy: Secondary | ICD-10-CM | POA: Diagnosis not present

## 2018-04-18 DIAGNOSIS — C50412 Malignant neoplasm of upper-outer quadrant of left female breast: Secondary | ICD-10-CM | POA: Diagnosis not present

## 2018-04-19 ENCOUNTER — Ambulatory Visit
Admission: RE | Admit: 2018-04-19 | Discharge: 2018-04-19 | Disposition: A | Discharge status: Home / Self Care | Payer: 59 | Source: Ambulatory Visit | Attending: Radiation Oncology | Admitting: Radiation Oncology

## 2018-04-19 ENCOUNTER — Ambulatory Visit: Payer: 59

## 2018-04-19 DIAGNOSIS — Z5112 Encounter for antineoplastic immunotherapy: Secondary | ICD-10-CM | POA: Diagnosis not present

## 2018-04-19 DIAGNOSIS — C50412 Malignant neoplasm of upper-outer quadrant of left female breast: Secondary | ICD-10-CM | POA: Diagnosis not present

## 2018-04-20 ENCOUNTER — Ambulatory Visit
Admission: RE | Admit: 2018-04-20 | Discharge: 2018-04-20 | Disposition: A | Discharge status: Home / Self Care | Payer: 59 | Source: Ambulatory Visit | Attending: Radiation Oncology | Admitting: Radiation Oncology

## 2018-04-20 ENCOUNTER — Encounter: Payer: Self-pay | Admitting: Radiation Oncology

## 2018-04-20 ENCOUNTER — Telehealth: Payer: Self-pay | Admitting: *Deleted

## 2018-04-20 DIAGNOSIS — Z5112 Encounter for antineoplastic immunotherapy: Secondary | ICD-10-CM | POA: Diagnosis not present

## 2018-04-20 DIAGNOSIS — C50412 Malignant neoplasm of upper-outer quadrant of left female breast: Secondary | ICD-10-CM | POA: Diagnosis not present

## 2018-04-20 NOTE — Telephone Encounter (Signed)
This RN spoke with pt per her call stating she has left messages for NP but has not had a return call ( this RN does not have any messages from the pt noted ).  Madison Hopkins states " I am just wanting to talk with Dr Jana Hakim more regarding this Beryle Flock and I am not sure I want to get it "  She states she is unsure due to " what I have read about this medication "  " why do I need it - is there still cancer in my body and how do we know it is there "  " the pharmacist that spoke with me didn't have a lot of information on why Dr Jana Hakim wanted me to use this medication"  " should I have had a mastectomy instead of a lumpectomy "  " I choose the lumpectomy and radiation because I was told it was the same as doing the mastectomy "  This RN validated the patient's concerns and informed her she is scheduled to see Dr Jannifer Rodney next week.  This RN advised pt she should write her questions down so when she speaks with him she can make sure she is getting the answers she needs to help her understand why additional therapy is being recommended.  Of note she states she received a letter from her insurance company " stating they would not cover this drug and then may not cover my care " " I do not want to do anything that would not allow me to have insurance "  This RN advised pt to bring this letter with her to the office.  Per end of phone discussion - this RN again validated the pt's concerns and questions as well as that her inquiries are best answered by the MD. She is comfortable waiting to speak with MD and appreciated discussion today with plan to have her questions written as well as bring the letter from her insurance carrier.  No further questions at this time.

## 2018-04-23 NOTE — Telephone Encounter (Signed)
I have not received any messages.  Thanks for the detailed note and heads up!

## 2018-04-25 ENCOUNTER — Other Ambulatory Visit: Payer: Self-pay | Admitting: Oncology

## 2018-04-25 NOTE — Progress Notes (Signed)
[facility]  [phone]) [phone] [phone]) [phone]    ID: [name] DOB: 06-Dec-1955  MR#: [id number]  QMG#:[id number]  Patient Care Team: Madison Hopkins as PCP - General (Physician Assistant) Madison Hopkins, Madison Dad, MD as Consulting Physician (Oncology) Madison Rudd, MD as Consulting Physician (Radiation Oncology) Madison Hopkins, Madison All, MD as Consulting Physician (Obstetrics and Gynecology) Madison Fitz, MD as Consulting Physician (Family Medicine) Madison Skates, MD as Consulting Physician (General Surgery) OTHER MD:   CHIEF COMPLAINT: HER-2 positive breast cancer  CURRENT TREATMENT: Completing adjuvant Trastuzumab   INTERVAL HISTORY: Madison Hopkins returns today for follow-up and treatment of her HER-2 positive, weakly estrogen receptor positive breast cancer. She is accompanied by a friend or family member.  She continues on trastuzumab. She tolerates this well and without any noticeable side effects.    Madison Hopkins's last echocardiogram on 03/07/2018, showed an ejection fraction in the 55% - 60% range. She saw Dr. Haroldine Hopkins on 03/07/2018 and he continues to monitor her ejection fraction and Doppler parameters, which are stable. She will return to see him again on 06/06/2018  Since her last visit here, she completed radiation on 04/20/2018. She notes that her skin is "burnt up."  She was able to continue to work right through the radiation treatments.   REVIEW OF SYSTEMS: Madison Hopkins continues to work, and she was very happy that they were flexible with her treatment schedule. She has started a plant based diet with smoothies in the morning, with a salad that includes protein at lunch. She has been drinking a lot of water. For exercise, she has been walking and going to the gym. The patient denies unusual headaches, visual changes, nausea, vomiting, or dizziness. There has been no unusual cough, phlegm production, or pleurisy. This been no change in bowel or  bladder habits. The patient denies unexplained fatigue or unexplained weight loss, bleeding, rash, or fever. A detailed review of systems was otherwise noncontributory.    HISTORY OF CURRENT ILLNESS: From the original intake note:  Madison Hopkins noted a mass in the left axilla sometime in January or February 2019.  She eventually brought her to her gynecologist's attention, and underwent bilateral diagnostic mammography with tomography and left breast ultrasonography at [facility] on 08/04/2017 showing: breast density category B. There is a highly suspicious hypoechoic mass in the left breast at the 2 o'clock upper outer quadrant measuring 2.3 x 1.6 x 2.2 cm, located 2 cm from the nipple. Sonographically, there were 2 enlarged lymph nodes in the left axilla, the largest with cortical thickening measuring 2.5 cm. No evidence of malignancy was seen in the right breast.   Accordingly on 08/07/2017 she proceeded to biopsy of the left breast area and 1 of the lymph nodes in question. The pathology from this procedure showed ([id number]): Invasive ductal carcinoma, grade 3. Metastatic carcinoma was found in one left axillary lymph node. Prognostic indicators significant for: estrogen receptor, 30% positive with weak staining intensity and progesterone receptor, 0% negative. Proliferation marker Ki67 at 80%. HER2 amplified with ratios HER2/CEP17 signals 2.32 and average HER2 copies per cell 6.60  The patient's subsequent history is as detailed below.   PAST MEDICAL HISTORY: Past Medical History:  Diagnosis Date  . Abnormal Pap smear of vagina 07/28/2016   LGSIL; colpo 07/2016 atrophic squamous cells; colpo 07/2017 - atypia  . Allergy   . Breast cancer ([place] [facility])    Metastatic Left breast  . Elevated hemoglobin A1c 07/28/2016   level -  6.1  . Endometriosis   . Low vitamin D level 07/28/2016   level 24.7  GERD but no ulcers   PAST SURGICAL HISTORY: Past Surgical History:  Procedure Laterality  Date  . ABDOMINAL HYSTERECTOMY    . BREAST LUMPECTOMY WITH RADIOACTIVE SEED AND AXILLARY LYMPH NODE DISSECTION Left 01/22/2018   Procedure: LEFT BREAST LUMPECTOMY WITH RADIOACTIVE SEED AND COMPLETE LEFT AXILLARY LYMPH NODE DISSECTION;  Surgeon: Madison Skates, MD;  Location: [facility];  Service: General;  Laterality: Left;  . CESAREAN SECTION    . COLON SURGERY    . OOPHORECTOMY    . PORTACATH PLACEMENT N/A 09/06/2017   Procedure: INSERTION PORT-A-CATH;  Surgeon: [name], MD;  Location: [facility];  Service: General;  Laterality: N/A;  . SMALL INTESTINE SURGERY    . SPINE SURGERY    Hysterectomy with salpingo-oophorectomy, Tonsillectomy, Plantar Fascitis Right Foot Surgery    FAMILY HISTORY: Family History  Problem Relation Age of Onset  . Mental illness Mother   . Alcoholism Mother   . Cirrhosis Mother   . Cancer Father   . Lung cancer Father    The patient' father died at age 64 due to lung cancer (heavy smoker). The patient's mother died due to liver cirrhosis (heavy drinker). The patient has 2 brothers and no sisters. There was a paternal 1st cousin with colon cancer diagnosed in the mid 40's, who also had cervical cancer. The mother of this 1st cousin (the patient's paternal aunt) had cancer (the patient's is unsure of what type). There was also a paternal uncle with prostate cancer diagnosed in the 24's. The patient denies a family history of breast or ovarian cancer.     GYNECOLOGIC HISTORY:  No LMP recorded (lmp unknown). Patient has had a hysterectomy. Menarche: 63 years old Age at first live birth: 63 years old She is GXP2.  The patient is status post total hysterectomy with bilateral salpingo-oophorectomy in 1995.  She never used contraception. She notes that she had an estrogen shot one time but no other HRTs.    SOCIAL HISTORY:  The patient works in Therapist, art for the tax department. She is single. At home is herself and no pets. Her son,  Madison Hopkins is age 33 and lives in [place], [place] as a Musician. The patient's daughter Madison Hopkins is age 41 and lives in [place] in Therapist, art for [employer]. The patient has 5 grandchildren and 1 great grandchild. She attends [employer].   ADVANCED DIRECTIVES: Not in place; at the 08/16/2017 visit the patient was given the appropriate forms to complete on notarized at her discretion   HEALTH MAINTENANCE: Social History   Tobacco Use  . Smoking status: Never Smoker  . Smokeless tobacco: Never Used  Substance Use Topics  . Alcohol use: Not Currently    Alcohol/week: 0.0 standard drinks    Frequency: Never    Comment: occasional  . Drug use: Yes    Types: Marijuana     Colonoscopy: 2009?  PAP: 07/31/2017/ positive for Atypical Squamous cells of uncertain significance.   Bone density: 10/24/2016 showed a T score of -0.7 (normal was (   Allergies  Allergen Reactions  . Penicillins Nausea Only    Has patient had a PCN reaction causing immediate rash, facial/tongue/throat swelling, SOB or lightheadedness with hypotension: No Has patient had a PCN reaction causing severe rash involving mucus membranes or skin necrosis: No Has patient had a PCN reaction that required hospitalization: No Has patient  had a PCN reaction occurring within the last 10 years: Yes--nausea & headache ONLY If Hopkins of the above answers are "NO", then may proceed with Cephalosporin use.     Current Outpatient Medications  Medication Sig Dispense Refill  . acyclovir ointment (ZOVIRAX) 5 % Apply 1 application topically every 4 (four) hours. (Patient not taking: Reported on 03/07/2018) 15 g 2  . Artificial Tear Solution (OPTI-TEARS OP) Place 1 drop into both eyes 3 (three) times daily as needed (for dry/irritated eyes.).    Marland Kitchen cholestyramine (QUESTRAN) 4 GM/DOSE powder Take 1 packet (4 g total) by mouth 2 (two) times daily with a meal. (Patient not taking: Reported on 03/07/2018) 378 g 2  . fluticasone (FLONASE)  50 MCG/ACT nasal spray Place 1 spray into both nostrils daily as needed for allergies.    Marland Kitchen HYDROcodone-acetaminophen (NORCO) 5-325 MG tablet 1/2 to 1 tablets Q 4 hours prn pain (Patient not taking: Reported on 03/07/2018) 30 tablet 0  . HYDROcodone-acetaminophen (NORCO) 5-325 MG tablet Take 1-2 tablets by mouth every 6 (six) hours as needed for moderate pain or severe pain. (Patient not taking: Reported on 03/07/2018) 30 tablet 0  . hydrocortisone (ANUSOL-HC) 25 MG suppository Place 1 suppository (25 mg total) rectally 2 (two) times daily. (Patient not taking: Reported on 03/07/2018) 12 suppository 0  . loperamide (IMODIUM) 2 MG capsule Take 1 capsule (2 mg total) by mouth as needed for diarrhea or loose stools. (Patient not taking: Reported on 03/07/2018) 30 capsule 0  . LORazepam (ATIVAN) 1 MG tablet Take 1 mg by mouth as needed for anxiety.    . Misc Natural Products (OSTEO BI-FLEX ADV TRIPLE ST PO) Take 1 tablet by mouth daily after lunch.    . Multiple Vitamin (MULTIVITAMIN WITH MINERALS) TABS tablet Take 1 tablet by mouth daily after lunch.    . naproxen sodium (ALEVE) 220 MG tablet Take 220 mg by mouth 2 (two) times daily as needed (FOR PAIN.).     Marland Kitchen tetrahydrozoline (VISINE) 0.05 % ophthalmic solution Place 1 drop into both eyes 3 (three) times daily as needed (for dry eyes.).    Marland Kitchen valACYclovir (VALTREX) 500 MG tablet Take 1 tablet (500 mg total) by mouth 2 (two) times daily. (Patient not taking: Reported on 03/07/2018) 60 tablet 2   No current facility-administered medications for this visit.     OBJECTIVE: Middle-aged African-American woman in no acute distress  Vitals:   04/26/18 1400  BP: (!) 151/74  Pulse: 69  Resp: 18  Temp: 98.6 F (37 C)  SpO2: 100%     Body mass index is 30.86 kg/m.   Wt Readings from Last 3 Encounters:  04/26/18 200 lb (90.7 kg)  03/07/18 197 lb (89.4 kg)  02/13/18 193 lb 2 oz (87.6 kg)  ECOG FS:1 - Symptomatic but completely ambulatory  Sclerae unicteric,  EOMs intact Oropharynx clear and moist No cervical or supraclavicular adenopathy Lungs no rales or rhonchi Heart regular rate and rhythm Abd soft, nontender, positive bowel sounds MSK no focal spinal tenderness, no upper extremity lymphedema Neuro: nonfocal, well oriented, appropriate affect Breasts: Right breast is unremarkable per the left breast is status post lumpectomy and radiation.  There is some dry desquamation.  There is hyperpigmentation.  There is no evidence of disease recurrence.  Both axillae are benign.    LAB RESULTS:  CMP     Component Value Date/Time   NA 141 04/26/2018 1329   K 3.4 (L) 04/26/2018 1329   CL 107 04/26/2018  1329   CO2 25 04/26/2018 1329   GLUCOSE 97 04/26/2018 1329   BUN 9 04/26/2018 1329   CREATININE 0.82 04/26/2018 1329   CREATININE 0.81 10/25/2017 1055   CALCIUM 9.9 04/26/2018 1329   PROT 7.6 04/26/2018 1329   ALBUMIN 4.1 04/26/2018 1329   AST 17 04/26/2018 1329   AST 23 10/25/2017 1055   ALT 17 04/26/2018 1329   ALT 30 10/25/2017 1055   ALKPHOS 121 04/26/2018 1329   BILITOT 0.5 04/26/2018 1329   BILITOT 0.6 10/25/2017 1055   GFRNONAA >60 04/26/2018 1329   GFRNONAA >60 10/25/2017 1055   GFRAA >60 04/26/2018 1329   GFRAA >60 10/25/2017 1055    No results found for: TOTALPROTELP, ALBUMINELP, A1GS, A2GS, BETS, BETA2SER, GAMS, MSPIKE, SPEI  No results found for: KPAFRELGTCHN, LAMBDASER, KAPLAMBRATIO  Lab Results  Component Value Date   WBC 6.2 04/26/2018   NEUTROABS 3.7 04/26/2018   HGB 10.8 (L) 04/26/2018   HCT 34.3 (L) 04/26/2018   MCV 85.5 04/26/2018   PLT 280 04/26/2018    '@LASTCHEMISTRY'$ @  No results found for: LABCA2  No components found for: UXLKGM010  No results for input(s): INR in the last 168 hours.  No results found for: LABCA2  No results found for: UVO536  No results found for: UYQ034  No results found for: VQQ595  No results found for: CA2729  No components found for: HGQUANT  No results found  for: CEA1 / No results found for: CEA1   No results found for: AFPTUMOR  No results found for: CHROMOGRNA  No results found for: PSA1  Appointment on 04/26/2018  Component Date Value Ref Range Status  . Sodium 04/26/2018 141  135 - 145 mmol/L Final  . Potassium 04/26/2018 3.4* 3.5 - 5.1 mmol/L Final  . Chloride 04/26/2018 107  98 - 111 mmol/L Final  . CO2 04/26/2018 25  22 - 32 mmol/L Final  . Glucose, Bld 04/26/2018 97  70 - 99 mg/dL Final  . BUN 04/26/2018 9  8 - 23 mg/dL Final  . Creatinine, Ser 04/26/2018 0.82  0.44 - 1.00 mg/dL Final  . Calcium 04/26/2018 9.9  8.9 - 10.3 mg/dL Final  . Total Protein 04/26/2018 7.6  6.5 - 8.1 g/dL Final  . Albumin 04/26/2018 4.1  3.5 - 5.0 g/dL Final  . AST 04/26/2018 17  15 - 41 U/L Final  . ALT 04/26/2018 17  0 - 44 U/L Final  . Alkaline Phosphatase 04/26/2018 121  38 - 126 U/L Final  . Total Bilirubin 04/26/2018 0.5  0.3 - 1.2 mg/dL Final  . GFR calc non Af Amer 04/26/2018 >60  >60 mL/min Final  . GFR calc Af Amer 04/26/2018 >60  >60 mL/min Final  . Anion gap 04/26/2018 9  5 - 15 Final   Performed at [facility], [place] [address] [place], [place] [address]  . WBC 04/26/2018 6.2  4.0 - 10.5 K/uL Final  . RBC 04/26/2018 4.01  3.87 - 5.11 MIL/uL Final  . Hemoglobin 04/26/2018 10.8* 12.0 - 15.0 g/dL Final  . HCT 04/26/2018 34.3* 36.0 - 46.0 % Final  . MCV 04/26/2018 85.5  80.0 - 100.0 fL Final  . MCH 04/26/2018 26.9  26.0 - 34.0 pg Final  . MCHC 04/26/2018 31.5  30.0 - 36.0 g/dL Final  . RDW 04/26/2018 13.3  11.5 - 15.5 % Final  . Platelets 04/26/2018 280  150 - 400 K/uL Final  . nRBC 04/26/2018 0.0  0.0 - 0.2 %  Final  . Neutrophils Relative % 04/26/2018 59  % Final  . Neutro Abs 04/26/2018 3.7  1.7 - 7.7 K/uL Final  . Lymphocytes Relative 04/26/2018 27  % Final  . Lymphs Abs 04/26/2018 1.7  0.7 - 4.0 K/uL Final  . Monocytes Relative 04/26/2018 11  % Final  . Monocytes Absolute 04/26/2018 0.7  0.1 - 1.0 K/uL  Final  . Eosinophils Relative 04/26/2018 3  % Final  . Eosinophils Absolute 04/26/2018 0.2  0.0 - 0.5 K/uL Final  . Basophils Relative 04/26/2018 0  % Final  . Basophils Absolute 04/26/2018 0.0  0.0 - 0.1 K/uL Final  . Immature Granulocytes 04/26/2018 0  % Final  . Abs Immature Granulocytes 04/26/2018 0.01  0.00 - 0.07 K/uL Final   Performed at [facility], [address] [place], [place] [address]    (this displays the last labs from the last 3 days)  No results found for: TOTALPROTELP, ALBUMINELP, A1GS, A2GS, BETS, BETA2SER, GAMS, MSPIKE, SPEI (this displays SPEP labs)  No results found for: KPAFRELGTCHN, LAMBDASER, KAPLAMBRATIO (kappa/lambda light chains)  No results found for: HGBA, HGBA2QUANT, HGBFQUANT, HGBSQUAN (Hemoglobinopathy evaluation)   No results found for: LDH  No results found for: IRON, TIBC, IRONPCTSAT (Iron and TIBC)  No results found for: FERRITIN  Urinalysis    Component Value Date/Time   BILIRUBINUR negative 10/07/2017 0939   BILIRUBINUR n 08/25/2016 1407   KETONESUR negative 10/07/2017 0939   PROTEINUR =30 (A) 10/07/2017 0939   PROTEINUR n 08/25/2016 1407   UROBILINOGEN 1.0 10/07/2017 0939   NITRITE Positive (A) 10/07/2017 0939   NITRITE n 08/25/2016 1407   LEUKOCYTESUR Negative 10/07/2017 0939     STUDIES: No results found.   ELIGIBLE FOR AVAILABLE RESEARCH PROTOCOL: yes (see Research Studies)   ASSESSMENT: 63 y.o. [place], [place] woman status post left breast upper outer quadrant and left axillary lymph node biopsy 08/07/2017, both positive for a T2 N1, stage IIB invasive ductal carcinoma, grade 3, estrogen receptor weakly positive, progesterone receptor negative, but HER-2 amplified, with an MIB-1 of 80%  (a) staging chest CT and bone scan 08/30/2017 showed no evidence of metastatic disease  (1) neoadjuvant chemotherapy will consist of carboplatin, docetaxel, trastuzumab, and Pertuzumab given every 21 days x 6  starting 09/07/2017, perjeta omitted with cycle 2 and 3 due to diarrhea  (a) Gemcitabine substituted for Docetaxel beginning with cycle 5 and 6 for concerns of neuropathy  (2) trastuzumab continued to complete 6 months, last dose 04/06/2018  (a) echocardiogram 08/28/2017 showed an ejection fraction in the 60-65% range.  (b) echocardiogram on 11/29/2017 showed an EF of 55-60%  (c) echocardiogram 03/07/2018 showed an ejection fraction in the 55-60% range  (3) Left lumpectomy on 01/22/2018 shows a ypT1b pN1a, grade 3 residual invasive ductal carcinoma, agnostic panel HER-2 negative, ER 40% weakly positive, PR negative.   (a) foundation one testing requested on 02/02/2018  (b) total of 11 axillary lymph nodes removed (1+)  (4) adjuvant radiation completed 04/20/2018  (5) to start anastrozole 05/30/2018   PLAN: Madison Hopkins has completed her local therapy for breast cancer and generally tolerated radiation well.  In a week or 2 her skin will have healed nicely although she understands the hyperpigmentation will take longer.  I was able to obtain pembrolizumab for Madison Hopkins and we discussed the possible toxicities side effects and complications as well as the potential benefits for this medication understanding that it is not standard of care in her situation.  She understands that  I am concerned that she still has residual tumor since she had positive lymph nodes after neoadjuvant chemo.  After much discussion she decided that she really does not want to try it at least not now.  She might be willing to try it later on.  We discussed antiestrogens and specifically the difference between tamoxifen and anastrozole in detail. She understands that anastrozole and the aromatase inhibitors in general work by blocking estrogen production. Accordingly vaginal dryness, decrease in bone density, and of course hot flashes can result. The aromatase inhibitors can also negatively affect the cholesterol profile, although that  is a minor effect. One out of 5 women on aromatase inhibitors we will feel "old and achy". This arthralgia/myalgia syndrome, which resembles fibromyalgia clinically, does resolve with stopping the medications. Accordingly this is not a reason to not try an aromatase inhibitor but it is a frequent reason to stop it (in other words 20% of women will not be able to tolerate these medications).  Tamoxifen on the other hand does not block estrogen production. It does not "take away a woman's estrogen". It blocks the estrogen receptor in breast cells. Like anastrozole, it can also cause hot flashes. As opposed to anastrozole, tamoxifen has many estrogen-like effects. It is technically an estrogen receptor modulator. This means that in some tissues tamoxifen works like estrogen-- for example it helps strengthen the bones. It tends to improve the cholesterol profile. It can cause thickening of the endometrial lining, and even endometrial polyps or rarely cancer of the uterus.(The risk of uterine cancer due to tamoxifen is one additional cancer per thousand women year). It can cause vaginal wetness or stickiness. It can cause blood clots through this estrogen-like effect--the risk of blood clots with tamoxifen is exactly the same as with birth control pills or hormone replacement.  Neither of these agents causes mood changes or weight gain, despite the popular belief that they can have these side effects. We have data from studies comparing either of these drugs with placebo, and in those cases the control group had the same amount of weight gain and depression as the group that took the drug.  We are going to start anastrozole 05/30/2018.  She will see me about 3 weeks later to assess tolerance.  If she tolerates it well the plan will be to continue it for about 5 years.  She will need a bone density study at regular intervals for monitoring.  She knows to call for any other issue that may develop before the next  visit.   Finley Dinkel, Madison Dad, MD  04/26/18 2:52 PM Medical Oncology and Hematology [facility] [address] [place], [place] [address] Tel. [phone]    Fax. [phone]   I, Jacqualyn Posey am acting as a Education administrator for Chauncey Cruel, MD.   I, Lurline Del MD, have reviewed the above documentation for accuracy and completeness, and I agree with the above.

## 2018-04-26 ENCOUNTER — Inpatient Hospital Stay: Payer: 59

## 2018-04-26 ENCOUNTER — Telehealth: Payer: Self-pay | Admitting: Oncology

## 2018-04-26 ENCOUNTER — Inpatient Hospital Stay: Payer: 59 | Admitting: Oncology

## 2018-04-26 VITALS — BP 151/74 | HR 69 | Temp 98.6°F | Resp 18 | Ht 67.5 in | Wt 200.0 lb

## 2018-04-26 DIAGNOSIS — C50412 Malignant neoplasm of upper-outer quadrant of left female breast: Secondary | ICD-10-CM

## 2018-04-26 DIAGNOSIS — Z95828 Presence of other vascular implants and grafts: Secondary | ICD-10-CM

## 2018-04-26 DIAGNOSIS — Z170 Estrogen receptor positive status [ER+]: Principal | ICD-10-CM

## 2018-04-26 DIAGNOSIS — Z5112 Encounter for antineoplastic immunotherapy: Secondary | ICD-10-CM | POA: Diagnosis not present

## 2018-04-26 DIAGNOSIS — C773 Secondary and unspecified malignant neoplasm of axilla and upper limb lymph nodes: Secondary | ICD-10-CM | POA: Diagnosis not present

## 2018-04-26 LAB — COMPREHENSIVE METABOLIC PANEL
ALT: 17 U/L (ref 0–44)
AST: 17 U/L (ref 15–41)
Albumin: 4.1 g/dL (ref 3.5–5.0)
Alkaline Phosphatase: 121 U/L (ref 38–126)
Anion gap: 9 (ref 5–15)
BUN: 9 mg/dL (ref 8–23)
CO2: 25 mmol/L (ref 22–32)
Calcium: 9.9 mg/dL (ref 8.9–10.3)
Chloride: 107 mmol/L (ref 98–111)
Creatinine, Ser: 0.82 mg/dL (ref 0.44–1.00)
GFR calc Af Amer: >60 mL/min (ref >60)
GFR calc non Af Amer: >60 mL/min (ref >60)
Glucose, Bld: 97 mg/dL (ref 70–99)
Potassium: 3.4 mmol/L — ABNORMAL LOW (ref 3.5–5.1)
Sodium: 141 mmol/L (ref 135–145)
Total Bilirubin: 0.5 mg/dL (ref 0.3–1.2)
Total Protein: 7.6 g/dL (ref 6.5–8.1)

## 2018-04-26 LAB — CBC WITH DIFFERENTIAL/PLATELET
Abs Immature Granulocytes: 0.01 10*3/uL (ref 0.00–0.07)
Basophils Absolute: 0 10*3/uL (ref 0.0–0.1)
Basophils Relative: 0 %
Eosinophils Absolute: 0.2 10*3/uL (ref 0.0–0.5)
Eosinophils Relative: 3 %
HCT: 34.3 % — ABNORMAL LOW (ref 36.0–46.0)
Hemoglobin: 10.8 g/dL — ABNORMAL LOW (ref 12.0–15.0)
Immature Granulocytes: 0 %
Lymphocytes Relative: 27 %
Lymphs Abs: 1.7 10*3/uL (ref 0.7–4.0)
MCH: 26.9 pg (ref 26.0–34.0)
MCHC: 31.5 g/dL (ref 30.0–36.0)
MCV: 85.5 fL (ref 80.0–100.0)
Monocytes Absolute: 0.7 10*3/uL (ref 0.1–1.0)
Monocytes Relative: 11 %
Neutro Abs: 3.7 10*3/uL (ref 1.7–7.7)
Neutrophils Relative %: 59 %
Platelets: 280 10*3/uL (ref 150–400)
RBC: 4.01 MIL/uL (ref 3.87–5.11)
RDW: 13.3 % (ref 11.5–15.5)
WBC: 6.2 10*3/uL (ref 4.0–10.5)
nRBC: 0 % (ref 0.0–0.2)

## 2018-04-26 MED ORDER — DIPHENHYDRAMINE HCL 25 MG PO CAPS
ORAL_CAPSULE | ORAL | Status: AC
  Filled 2018-04-26: qty 1

## 2018-04-26 MED ORDER — DIPHENHYDRAMINE HCL 25 MG PO CAPS
25.0000 mg | ORAL_CAPSULE | Freq: Once | ORAL | Status: AC
  Administered 2018-04-26: 25 mg via ORAL

## 2018-04-26 MED ORDER — HEPARIN SOD (PORK) LOCK FLUSH 100 UNIT/ML IV SOLN
500.0000 [IU] | Freq: Once | INTRAVENOUS | Status: AC | PRN
  Administered 2018-04-26: 500 [IU]
  Filled 2018-04-26: qty 5

## 2018-04-26 MED ORDER — ACETAMINOPHEN 325 MG PO TABS
650.0000 mg | ORAL_TABLET | Freq: Once | ORAL | Status: AC
  Administered 2018-04-26: 650 mg via ORAL

## 2018-04-26 MED ORDER — SODIUM CHLORIDE 0.9% FLUSH
10.0000 mL | INTRAVENOUS | Status: DC | PRN
  Administered 2018-04-26: 10 mL
  Filled 2018-04-26: qty 10

## 2018-04-26 MED ORDER — ACETAMINOPHEN 325 MG PO TABS
ORAL_TABLET | ORAL | Status: AC
  Filled 2018-04-26: qty 2

## 2018-04-26 MED ORDER — SODIUM CHLORIDE 0.9 % IV SOLN
Freq: Once | INTRAVENOUS | Status: AC
  Administered 2018-04-26: 15:00:00 via INTRAVENOUS
  Filled 2018-04-26: qty 250

## 2018-04-26 MED ORDER — TRASTUZUMAB CHEMO 150 MG IV SOLR
6.0000 mg/kg | Freq: Once | INTRAVENOUS | Status: AC
  Administered 2018-04-26: 546 mg via INTRAVENOUS
  Filled 2018-04-26: qty 26

## 2018-04-26 MED ORDER — SODIUM CHLORIDE 0.9% FLUSH
10.0000 mL | Freq: Once | INTRAVENOUS | Status: AC
  Administered 2018-04-26: 10 mL
  Filled 2018-04-26: qty 10

## 2018-04-26 NOTE — Patient Instructions (Signed)
[facility] Discharge Instructions for Patients Receiving Chemotherapy Today you received the following chemotherapy agents:  Herceptin To help prevent nausea and vomiting after your treatment, we encourage you to take your nausea medication as prescribed.   If you develop nausea and vomiting that is not controlled by your nausea medication, call the clinic.   BELOW ARE SYMPTOMS THAT SHOULD BE REPORTED IMMEDIATELY:  *FEVER GREATER THAN 100.5 F  *CHILLS WITH OR WITHOUT FEVER  NAUSEA AND VOMITING THAT IS NOT CONTROLLED WITH YOUR NAUSEA MEDICATION  *UNUSUAL SHORTNESS OF BREATH  *UNUSUAL BRUISING OR BLEEDING  TENDERNESS IN MOUTH AND THROAT WITH OR WITHOUT PRESENCE OF ULCERS  *URINARY PROBLEMS  *BOWEL PROBLEMS  UNUSUAL RASH Items with * indicate a potential emergency and should be followed up as soon as possible.  Feel free to call the clinic should you have any questions or concerns. The clinic phone number is [phone].  Please show the CHEMO ALERT CARD at check-in to the Emergency Department and triage nurse.   

## 2018-04-26 NOTE — Telephone Encounter (Signed)
Gave avs and calendar ° °

## 2018-05-03 ENCOUNTER — Telehealth: Payer: Self-pay | Admitting: *Deleted

## 2018-05-03 NOTE — Telephone Encounter (Signed)
Medical records faxed to [facility] RN; [place] [phone]

## 2018-05-16 ENCOUNTER — Telehealth: Payer: Self-pay | Admitting: Radiation Oncology

## 2018-05-16 NOTE — Telephone Encounter (Signed)
I called the patient and she reports her skin is healing well and she's seeing some hyperpigmentation but otherwise doing great. We discussed skin care and considerations of avoiding sun exposure to the treatment area. She is in agreement and given the coronavirus risks, she is comfortable cancelling her appt for next week, but will call back if she has questions or concerns.

## 2018-05-21 ENCOUNTER — Ambulatory Visit: Payer: Self-pay | Admitting: Radiation Oncology

## 2018-05-25 ENCOUNTER — Telehealth: Payer: Self-pay | Admitting: *Deleted

## 2018-05-25 MED ORDER — SULFAMETHOXAZOLE-TRIMETHOPRIM 800-160 MG PO TABS: 1.0000 | ORAL_TABLET | Freq: Two times a day (BID) | ORAL | 0 refills | Status: DC

## 2018-05-25 NOTE — Telephone Encounter (Signed)
This RN spoke with pt late in day per her call stating onset of " left breast tenderness "- Candee states she " cleaned out my pantry earlier this week and I know over did it "  She states left breast is slightly warmer then right, with some mild swelling and a knot under the left arm at site of surgical incision.  She denies any swelling down her arm, she denies any fevers.  Per above- reviewed with [facility] provider and prescription obtained for antibiotic.  Discussed with pt above as well as to call this RN on Monday with update.

## 2018-05-28 ENCOUNTER — Telehealth: Payer: Self-pay | Admitting: *Deleted

## 2018-05-28 ENCOUNTER — Inpatient Hospital Stay: Payer: 59 | Attending: Adult Health | Admitting: Oncology

## 2018-05-28 ENCOUNTER — Ambulatory Visit: Payer: 59 | Admitting: Oncology

## 2018-05-28 ENCOUNTER — Other Ambulatory Visit: Payer: Self-pay

## 2018-05-28 VITALS — BP 117/70 | HR 77 | Temp 99.2°F | Resp 18 | Ht 67.5 in | Wt 198.2 lb

## 2018-05-28 DIAGNOSIS — Z8541 Personal history of malignant neoplasm of cervix uteri: Secondary | ICD-10-CM | POA: Insufficient documentation

## 2018-05-28 DIAGNOSIS — Z90710 Acquired absence of both cervix and uterus: Secondary | ICD-10-CM | POA: Diagnosis not present

## 2018-05-28 DIAGNOSIS — Z800 Family history of malignant neoplasm of digestive organs: Secondary | ICD-10-CM | POA: Diagnosis not present

## 2018-05-28 DIAGNOSIS — Z170 Estrogen receptor positive status [ER+]: Secondary | ICD-10-CM | POA: Insufficient documentation

## 2018-05-28 DIAGNOSIS — Z9079 Acquired absence of other genital organ(s): Secondary | ICD-10-CM | POA: Diagnosis not present

## 2018-05-28 DIAGNOSIS — Z90722 Acquired absence of ovaries, bilateral: Secondary | ICD-10-CM | POA: Diagnosis not present

## 2018-05-28 DIAGNOSIS — C50412 Malignant neoplasm of upper-outer quadrant of left female breast: Secondary | ICD-10-CM

## 2018-05-28 DIAGNOSIS — Z923 Personal history of irradiation: Secondary | ICD-10-CM | POA: Insufficient documentation

## 2018-05-28 DIAGNOSIS — Z801 Family history of malignant neoplasm of trachea, bronchus and lung: Secondary | ICD-10-CM | POA: Insufficient documentation

## 2018-05-28 MED ORDER — ANASTROZOLE 1 MG PO TABS: 1.0000 mg | ORAL_TABLET | Freq: Every day | ORAL | 4 refills | Status: DC

## 2018-05-28 NOTE — Progress Notes (Signed)
[place]  Telephone:(336) [phone] [phone]    ID: Madison Hopkins DOB: 1955/05/19  MR#: [id number]  CSN#:[id number]  Patient Care Team: Donzetta Kohut as PCP - General (Physician Assistant) Shonda Mandarino, [name] Dad, MD as Consulting Physician (Oncology) Kyung Rudd, MD as Consulting Physician (Radiation Oncology) [name], [name] All, MD as Consulting Physician (Obstetrics and Gynecology) Gentry Fitz, MD as Consulting Physician (Family Medicine) Bensimhon, [name], MD as Consulting Physician (Cardiology) [name], MD as Consulting Physician (General Surgery) OTHER MD:   CHIEF COMPLAINT: HER-2 positive breast cancer  CURRENT TREATMENT: Anastrozole   INTERVAL HISTORY: Madison Hopkins returns today for follow-up and treatment of her HER-2 positive, weakly estrogen receptor positive breast cancer. She presents today with new onset left breast tenderness. Madison Hopkins reports she overexerted her chest while cleaning her pantry. She reports left breast warmth, mild swelling, a knot under her left arm at surgical incision site, and feverish. She states the fever went away with tylenol. She denies swelling down her arm.  Madison Hopkins's last echocardiogram on 03/07/2018, showed an ejection fraction in the 55% - 60% range. She last saw Dr. Haroldine Laws on 03/07/2018 and he continues to monitor her ejection fraction and Doppler parameters, which are stable. She will return to see him again on 06/06/2018.   REVIEW OF SYSTEMS:  She continues to walk to exercise. The patient denies unusual headaches, visual changes, nausea, vomiting, stiff neck, dizziness, or gait imbalance. There has been no cough, phlegm production, or pleurisy, no chest pain or pressure, and no change in bowel or bladder habits. The patient denies rash, bleeding, unexplained fatigue or unexplained weight loss. A detailed review of systems was otherwise entirely negative.   HISTORY OF CURRENT ILLNESS:  From the original intake note:  Madison Hopkins noted a mass in the left axilla sometime in January or February 2019.  She eventually brought her to her gynecologist's attention, and underwent bilateral diagnostic mammography with tomography and left breast ultrasonography at [facility] on 08/04/2017 showing: breast density category B. There is a highly suspicious hypoechoic mass in the left breast at the 2 o'clock upper outer quadrant measuring 2.3 x 1.6 x 2.2 cm, located 2 cm from the nipple. Sonographically, there were 2 enlarged lymph nodes in the left axilla, the largest with cortical thickening measuring 2.5 cm. No evidence of malignancy was seen in the right breast.   Accordingly on 08/07/2017 she proceeded to biopsy of the left breast area and 1 of the lymph nodes in question. The pathology from this procedure showed ([id number]): Invasive ductal carcinoma, grade 3. Metastatic carcinoma was found in one left axillary lymph node. Prognostic indicators significant for: estrogen receptor, 30% positive with weak staining intensity and progesterone receptor, 0% negative. Proliferation marker Ki67 at 80%. HER2 amplified with ratios HER2/CEP17 signals 2.32 and average HER2 copies per cell 6.60  The patient's subsequent history is as detailed below.   PAST MEDICAL HISTORY: Past Medical History:  Diagnosis Date  . Abnormal Pap smear of vagina 07/28/2016   LGSIL; colpo 07/2016 atrophic squamous cells; colpo 07/2017 - atypia  . Allergy   . Breast cancer ([place])    Metastatic Left breast  . Elevated hemoglobin A1c 07/28/2016   level - 6.1  . Endometriosis   . Low vitamin D level 07/28/2016   level 24.7  GERD but no ulcers   PAST SURGICAL HISTORY: Past Surgical History:  Procedure Laterality Date  . ABDOMINAL HYSTERECTOMY    . BREAST LUMPECTOMY  WITH RADIOACTIVE SEED AND AXILLARY LYMPH NODE DISSECTION Left 01/22/2018   Procedure: LEFT BREAST LUMPECTOMY WITH RADIOACTIVE SEED AND COMPLETE  LEFT AXILLARY LYMPH NODE DISSECTION;  Surgeon: [name], MD;  Location: [facility];  Service: General;  Laterality: Left;  . CESAREAN SECTION    . COLON SURGERY    . OOPHORECTOMY    . PORTACATH PLACEMENT N/A 09/06/2017   Procedure: INSERTION PORT-A-CATH;  Surgeon: [name], MD;  Location: [facility];  Service: General;  Laterality: N/A;  . SMALL INTESTINE SURGERY    . SPINE SURGERY    Hysterectomy with salpingo-oophorectomy, Tonsillectomy, Plantar Fascitis Right Foot Surgery    FAMILY HISTORY: Family History  Problem Relation Age of Onset  . Mental illness Mother   . Alcoholism Mother   . Cirrhosis Mother   . Cancer Father   . Lung cancer Father    The patient' father died at age 26 due to lung cancer (heavy smoker). The patient's mother died due to liver cirrhosis (heavy drinker). The patient has 2 brothers and no sisters. There was a paternal 1st cousin with colon cancer diagnosed in the mid 40's, who also had cervical cancer. The mother of this 1st cousin (the patient's paternal aunt) had cancer (the patient's is unsure of what type). There was also a paternal uncle with prostate cancer diagnosed in the 75's. The patient denies a family history of breast or ovarian cancer.     GYNECOLOGIC HISTORY:  No LMP recorded (lmp unknown). Patient has had a hysterectomy. Menarche: 63 years old Age at first live birth: 63 years old She is GXP2.  The patient is status post total hysterectomy with bilateral salpingo-oophorectomy in 1995.  She never used contraception. She notes that she had an estrogen shot one time but no other HRTs.    SOCIAL HISTORY:  The patient works in Therapist, art for the tax department. She is single. At home is herself and no pets. Her son, Madison Hopkins is age 15 and lives in [place], [place] as a Musician. The patient's daughter Madison Hopkins is age 28 and lives in [place] in Therapist, art for [employer]. The patient has 5 grandchildren and 1 great  grandchild. She attends [employer].   ADVANCED DIRECTIVES: Not in place; at the 08/16/2017 visit the patient was given the appropriate forms to complete on notarized at her discretion   HEALTH MAINTENANCE: Social History   Tobacco Use  . Smoking status: Never Smoker  . Smokeless tobacco: Never Used  Substance Use Topics  . Alcohol use: Not Currently    Alcohol/week: 0.0 standard drinks    Frequency: Never    Comment: occasional  . Drug use: Yes    Types: Marijuana     Colonoscopy: 2009?  PAP: 07/31/2017/ positive for Atypical Squamous cells of uncertain significance.   Bone density: 10/24/2016 showed a T score of -0.7 (normal was (   Allergies  Allergen Reactions  . Penicillins Nausea Only    Has patient had a PCN reaction causing immediate rash, facial/tongue/throat swelling, SOB or lightheadedness with hypotension: No Has patient had a PCN reaction causing severe rash involving mucus membranes or skin necrosis: No Has patient had a PCN reaction that required hospitalization: No Has patient had a PCN reaction occurring within the last 10 years: Yes--nausea & headache ONLY If all of the above answers are "NO", then may proceed with Cephalosporin use.     Current Outpatient Medications  Medication Sig Dispense Refill  . acyclovir  ointment (ZOVIRAX) 5 % Apply 1 application topically every 4 (four) hours. (Patient not taking: Reported on 03/07/2018) 15 g 2  . anastrozole (ARIMIDEX) 1 MG tablet Take 1 tablet (1 mg total) by mouth daily. 90 tablet 4  . Artificial Tear Solution (OPTI-TEARS OP) Place 1 drop into both eyes 3 (three) times daily as needed (for dry/irritated eyes.).    Marland Kitchen cholestyramine (QUESTRAN) 4 GM/DOSE powder Take 1 packet (4 g total) by mouth 2 (two) times daily with a meal. (Patient not taking: Reported on 03/07/2018) 378 g 2  . fluticasone (FLONASE) 50 MCG/ACT nasal spray Place 1 spray into both nostrils daily as needed for allergies.    Marland Kitchen  HYDROcodone-acetaminophen (NORCO) 5-325 MG tablet 1/2 to 1 tablets Q 4 hours prn pain (Patient not taking: Reported on 03/07/2018) 30 tablet 0  . HYDROcodone-acetaminophen (NORCO) 5-325 MG tablet Take 1-2 tablets by mouth every 6 (six) hours as needed for moderate pain or severe pain. (Patient not taking: Reported on 03/07/2018) 30 tablet 0  . hydrocortisone (ANUSOL-HC) 25 MG suppository Place 1 suppository (25 mg total) rectally 2 (two) times daily. (Patient not taking: Reported on 03/07/2018) 12 suppository 0  . loperamide (IMODIUM) 2 MG capsule Take 1 capsule (2 mg total) by mouth as needed for diarrhea or loose stools. (Patient not taking: Reported on 03/07/2018) 30 capsule 0  . LORazepam (ATIVAN) 1 MG tablet Take 1 mg by mouth as needed for anxiety.    . Misc Natural Products (OSTEO BI-FLEX ADV TRIPLE ST PO) Take 1 tablet by mouth daily after lunch.    . Multiple Vitamin (MULTIVITAMIN WITH MINERALS) TABS tablet Take 1 tablet by mouth daily after lunch.    . naproxen sodium (ALEVE) 220 MG tablet Take 220 mg by mouth 2 (two) times daily as needed (FOR PAIN.).     Marland Kitchen sulfamethoxazole-trimethoprim (BACTRIM DS,SEPTRA DS) 800-160 MG tablet Take 1 tablet by mouth 2 (two) times daily. 14 tablet 0  . tetrahydrozoline (VISINE) 0.05 % ophthalmic solution Place 1 drop into both eyes 3 (three) times daily as needed (for dry eyes.).    Marland Kitchen valACYclovir (VALTREX) 500 MG tablet Take 1 tablet (500 mg total) by mouth 2 (two) times daily. (Patient not taking: Reported on 03/07/2018) 60 tablet 2   No current facility-administered medications for this visit.     OBJECTIVE: Middle-aged African-American woman who appears well  Vitals:   05/28/18 1400  BP: 117/70  Pulse: 77  Resp: 18  Temp: 99.2 F (37.3 C)  SpO2: 99%     Body mass index is 30.58 kg/m.   Wt Readings from Last 3 Encounters:  05/28/18 198 lb 3.2 oz (89.9 kg)  04/26/18 200 lb (90.7 kg)  03/07/18 197 lb (89.4 kg)  ECOG FS:1 - Symptomatic but completely  ambulatory  Sclerae unicteric, pupils round and equal Oropharynx clear and moist No cervical or supraclavicular adenopathy Lungs no rales or rhonchi Heart regular rate and rhythm Abd soft, nontender, positive bowel sounds MSK no focal spinal tenderness, no upper extremity lymphedema Neuro: nonfocal, well oriented, appropriate affect Breasts: The right breast is unremarkable.  The left breast is status post lumpectomy and radiation.  There is mild coarsening of the skin and still some hyperpigmentation.  There are no palpable masses.  There is no unusual tenderness.  There is minimal scar tissue under the incision site, with no erythema dehiscence or swelling.  There is no nipple change.  Both axillae are benign.  LAB RESULTS:  CMP  Component Value Date/Time   NA 141 04/26/2018 1329   K 3.4 (L) 04/26/2018 1329   CL 107 04/26/2018 1329   CO2 25 04/26/2018 1329   GLUCOSE 97 04/26/2018 1329   BUN 9 04/26/2018 1329   CREATININE 0.82 04/26/2018 1329   CREATININE 0.81 10/25/2017 1055   CALCIUM 9.9 04/26/2018 1329   PROT 7.6 04/26/2018 1329   ALBUMIN 4.1 04/26/2018 1329   AST 17 04/26/2018 1329   AST 23 10/25/2017 1055   ALT 17 04/26/2018 1329   ALT 30 10/25/2017 1055   ALKPHOS 121 04/26/2018 1329   BILITOT 0.5 04/26/2018 1329   BILITOT 0.6 10/25/2017 1055   GFRNONAA >60 04/26/2018 1329   GFRNONAA >60 10/25/2017 1055   GFRAA >60 04/26/2018 1329   GFRAA >60 10/25/2017 1055    No results found for: TOTALPROTELP, ALBUMINELP, A1GS, A2GS, BETS, BETA2SER, GAMS, MSPIKE, SPEI  No results found for: KPAFRELGTCHN, LAMBDASER, KAPLAMBRATIO  Lab Results  Component Value Date   WBC 6.2 04/26/2018   NEUTROABS 3.7 04/26/2018   HGB 10.8 (L) 04/26/2018   HCT 34.3 (L) 04/26/2018   MCV 85.5 04/26/2018   PLT 280 04/26/2018    '@LASTCHEMISTRY'$ @  No results found for: LABCA2  No components found for: YWVPXT062  No results for input(s): INR in the last 168 hours.  No results found  for: LABCA2  No results found for: IRS854  No results found for: OEV035  No results found for: KKX381  No results found for: CA2729  No components found for: HGQUANT  No results found for: CEA1 / No results found for: CEA1   No results found for: AFPTUMOR  No results found for: CHROMOGRNA  No results found for: PSA1  No visits with results within 3 Day(s) from this visit.  Latest known visit with results is:  Appointment on 04/26/2018  Component Date Value Ref Range Status  . Sodium 04/26/2018 141  135 - 145 mmol/L Final  . Potassium 04/26/2018 3.4* 3.5 - 5.1 mmol/L Final  . Chloride 04/26/2018 107  98 - 111 mmol/L Final  . CO2 04/26/2018 25  22 - 32 mmol/L Final  . Glucose, Bld 04/26/2018 97  70 - 99 mg/dL Final  . BUN 04/26/2018 9  8 - 23 mg/dL Final  . Creatinine, Ser 04/26/2018 0.82  0.44 - 1.00 mg/dL Final  . Calcium 04/26/2018 9.9  8.9 - 10.3 mg/dL Final  . Total Protein 04/26/2018 7.6  6.5 - 8.1 g/dL Final  . Albumin 04/26/2018 4.1  3.5 - 5.0 g/dL Final  . AST 04/26/2018 17  15 - 41 U/L Final  . ALT 04/26/2018 17  0 - 44 U/L Final  . Alkaline Phosphatase 04/26/2018 121  38 - 126 U/L Final  . Total Bilirubin 04/26/2018 0.5  0.3 - 1.2 mg/dL Final  . GFR calc non Af Amer 04/26/2018 >60  >60 mL/min Final  . GFR calc Af Amer 04/26/2018 >60  >60 mL/min Final  . Anion gap 04/26/2018 9  5 - 15 Final   Performed at [facility], [place] [address], [place], [place] [address]  . WBC 04/26/2018 6.2  4.0 - 10.5 K/uL Final  . RBC 04/26/2018 4.01  3.87 - 5.11 MIL/uL Final  . Hemoglobin 04/26/2018 10.8* 12.0 - 15.0 g/dL Final  . HCT 04/26/2018 34.3* 36.0 - 46.0 % Final  . MCV 04/26/2018 85.5  80.0 - 100.0 fL Final  . MCH 04/26/2018 26.9  26.0 - 34.0 pg Final  . MCHC  04/26/2018 31.5  30.0 - 36.0 g/dL Final  . RDW 04/26/2018 13.3  11.5 - 15.5 % Final  . Platelets 04/26/2018 280  150 - 400 K/uL Final  . nRBC 04/26/2018 0.0  0.0 - 0.2 % Final  .  Neutrophils Relative % 04/26/2018 59  % Final  . Neutro Abs 04/26/2018 3.7  1.7 - 7.7 K/uL Final  . Lymphocytes Relative 04/26/2018 27  % Final  . Lymphs Abs 04/26/2018 1.7  0.7 - 4.0 K/uL Final  . Monocytes Relative 04/26/2018 11  % Final  . Monocytes Absolute 04/26/2018 0.7  0.1 - 1.0 K/uL Final  . Eosinophils Relative 04/26/2018 3  % Final  . Eosinophils Absolute 04/26/2018 0.2  0.0 - 0.5 K/uL Final  . Basophils Relative 04/26/2018 0  % Final  . Basophils Absolute 04/26/2018 0.0  0.0 - 0.1 K/uL Final  . Immature Granulocytes 04/26/2018 0  % Final  . Abs Immature Granulocytes 04/26/2018 0.01  0.00 - 0.07 K/uL Final   Performed at [facility], [address] [place], [place] [address]    (this displays the last labs from the last 3 days)  No results found for: TOTALPROTELP, ALBUMINELP, A1GS, A2GS, BETS, BETA2SER, GAMS, MSPIKE, SPEI (this displays SPEP labs)  No results found for: KPAFRELGTCHN, LAMBDASER, KAPLAMBRATIO (kappa/lambda light chains)  No results found for: HGBA, HGBA2QUANT, HGBFQUANT, HGBSQUAN (Hemoglobinopathy evaluation)   No results found for: LDH  No results found for: IRON, TIBC, IRONPCTSAT (Iron and TIBC)  No results found for: FERRITIN  Urinalysis    Component Value Date/Time   BILIRUBINUR negative 10/07/2017 0939   BILIRUBINUR n 08/25/2016 1407   KETONESUR negative 10/07/2017 0939   PROTEINUR =30 (A) 10/07/2017 0939   PROTEINUR n 08/25/2016 1407   UROBILINOGEN 1.0 10/07/2017 0939   NITRITE Positive (A) 10/07/2017 0939   NITRITE n 08/25/2016 1407   LEUKOCYTESUR Negative 10/07/2017 0939     STUDIES: No results found.   ELIGIBLE FOR AVAILABLE RESEARCH PROTOCOL: No   ASSESSMENT: 63 y.o. [place], [place] woman status post left breast upper outer quadrant and left axillary lymph node biopsy 08/07/2017, both positive for a T2 N1, stage IIB invasive ductal carcinoma, grade 3, estrogen receptor weakly positive, progesterone  receptor negative, but HER-2 amplified, with an MIB-1 of 80%  (a) staging chest CT and bone scan 08/30/2017 showed no evidence of metastatic disease  (1) neoadjuvant chemotherapy will consist of carboplatin, docetaxel, trastuzumab, and Pertuzumab given every 21 days x 6 starting 09/07/2017, perjeta omitted with cycle 2 and 3 due to diarrhea  (a) Gemcitabine substituted for Docetaxel beginning with cycle 5 and 6 for concerns of neuropathy  (2) trastuzumab continued to complete 6 months, last dose 04/06/2018  (a) echocardiogram 08/28/2017 showed an ejection fraction in the 60-65% range.  (b) echocardiogram on 11/29/2017 showed an EF of 55-60%  (c) echocardiogram 03/07/2018 showed an ejection fraction in the 55-60% range  (3) Left lumpectomy on 01/22/2018 shows a ypT1b pN1a, grade 3 residual invasive ductal carcinoma, agnostic panel HER-2 negative, ER 40% weakly positive, PR negative.   (a) foundation one testing requested on 02/02/2018  (b) total of 11 axillary lymph nodes removed (1+)  (4) adjuvant radiation completed 04/20/2018  (5) starting anastrozole 05/30/2018   PLAN: I reassured Avangeline that her left breast has the expected changes from treatment but there is no evidence of cancer or infection by inspection and palpation.  There is no need to repeat mammography at this point.  She tells me  she had a fever last week.  She did not have associated cough, shortness of breath, or diarrhea, or other symptoms and the fever has resolved.  She was supposed to return 06/21/2018 to discuss Keytruda and possibly receive it but she has made a definitive decision that she does not want that drug so we will ask Dr. Dalbert Batman to remove her port at his discretion.  We discussed starting anastrozole.  She is ready to do that and I went ahead and placed the prescription.  I am going to see her in about 3 months just to make sure she tolerates treatment well  She knows to call for any other issue that may  develop before the next visit here.   Memori Sammon, [name] Dad, MD  05/28/18 2:43 PM Medical Oncology and Hematology [facility] [address] [place], [place] [address] Tel. [phone]    Fax. [phone]    I, Wilburn Mylar, am acting as scribe for Dr. [name] Dad. Zidane Renner.  I, Lurline Del MD, have reviewed the above documentation for accuracy and completeness, and I agree with the above.

## 2018-05-29 NOTE — Progress Notes (Signed)
  Radiation Oncology         [phone] ________________________________  Name: Madison Hopkins MRN: [id number]  Date: 04/20/2018  DOB: 10/01/1955  End of Treatment Note  Diagnosis:   63 y.o. female with Stage IIB, cT2N1M0, ypT1bN1a, grade 3 ER positive, HER2 amplified invasive ductal carcinoma of the left breast  Indication for treatment:  Curative       Radiation treatment dates:   03/06/2018 - 04/20/2018  Site/dose:   The patient initially received a dose of 50.4 Gy in 28 fractions to the left breast and supraclavicular region using whole-breast tangent fields. This was delivered using a 3-D conformal technique. The patient then received a boost to the surgical scar. This delivered an additional 10 Gy in 5 fractions using 15X, 10X, 6X photons with a Complex Isodose technique. The total dose was 60.4 Gy.  Narrative: The patient tolerated radiation treatment relatively well.   The patient had some expected skin irritation as she progressed during treatment, notably dry desquamation in the supraclavicular region and diffuse hyperpigmentation over the chest. Moist desquamation was not present at the end of treatment. She is applying Radiaplex gel as directed. She also noted increased fatigue.  Plan: The patient has completed radiation treatment. The patient will return to radiation oncology clinic for routine followup in one month. I advised the patient to call or return sooner if they have any questions or concerns related to their recovery or treatment. ________________________________  Jodelle Gross, M.D., Ph.D.  This document serves as a record of services personally performed by Kyung Rudd, MD. It was created on his behalf by Rae Lips, a trained medical scribe. The creation of this record is based on the scribe's personal observations and the provider's statements to them. This document has been checked and approved by the attending provider.

## 2018-06-06 ENCOUNTER — Encounter (HOSPITAL_COMMUNITY): Payer: 59 | Admitting: Internal Medicine

## 2018-06-06 ENCOUNTER — Ambulatory Visit (HOSPITAL_COMMUNITY): Payer: 59

## 2018-06-21 ENCOUNTER — Other Ambulatory Visit: Payer: Self-pay | Admitting: Oncology

## 2018-06-21 ENCOUNTER — Telehealth: Payer: Self-pay | Admitting: *Deleted

## 2018-06-21 ENCOUNTER — Ambulatory Visit: Payer: 59

## 2018-06-21 ENCOUNTER — Other Ambulatory Visit: Payer: 59

## 2018-06-21 ENCOUNTER — Encounter: Payer: Self-pay | Admitting: *Deleted

## 2018-06-21 ENCOUNTER — Ambulatory Visit: Payer: 59 | Admitting: Oncology

## 2018-06-21 DIAGNOSIS — Z170 Estrogen receptor positive status [ER+]: Principal | ICD-10-CM

## 2018-06-21 DIAGNOSIS — C50412 Malignant neoplasm of upper-outer quadrant of left female breast: Secondary | ICD-10-CM

## 2018-06-21 NOTE — Telephone Encounter (Signed)
VM left by pt stating she would like to obtain a second opinion per recommendation of discussion with [facility] nurse before she has port removed regarding benefit of further IV therapy.  Referral recommendation is to Dr Consuela Mimes at [facility].  Referral placed per above and MD review.  This note will be sent to HIM for referral appointment and sending of pt data.

## 2018-06-22 ENCOUNTER — Other Ambulatory Visit: Payer: Self-pay | Admitting: General Surgery

## 2018-06-29 ENCOUNTER — Encounter (HOSPITAL_BASED_OUTPATIENT_CLINIC_OR_DEPARTMENT_OTHER): Payer: Self-pay

## 2018-06-29 ENCOUNTER — Ambulatory Visit (HOSPITAL_BASED_OUTPATIENT_CLINIC_OR_DEPARTMENT_OTHER): Admit: 2018-06-29 | Payer: 59 | Admitting: General Surgery

## 2018-06-29 SURGERY — REMOVAL PORT-A-CATH
Anesthesia: Monitor Anesthesia Care

## 2018-07-03 DIAGNOSIS — Z79811 Long term (current) use of aromatase inhibitors: Secondary | ICD-10-CM | POA: Diagnosis not present

## 2018-07-03 DIAGNOSIS — C50412 Malignant neoplasm of upper-outer quadrant of left female breast: Secondary | ICD-10-CM | POA: Diagnosis not present

## 2018-07-03 DIAGNOSIS — Z170 Estrogen receptor positive status [ER+]: Secondary | ICD-10-CM | POA: Diagnosis not present

## 2018-07-05 ENCOUNTER — Other Ambulatory Visit: Payer: Self-pay | Admitting: General Surgery

## 2018-07-06 ENCOUNTER — Other Ambulatory Visit: Payer: Self-pay

## 2018-07-06 ENCOUNTER — Telehealth (INDEPENDENT_AMBULATORY_CARE_PROVIDER_SITE_OTHER): Payer: 59 | Admitting: Family Medicine

## 2018-07-06 ENCOUNTER — Encounter: Payer: Self-pay | Admitting: Family Medicine

## 2018-07-06 VITALS — Wt 202.0 lb

## 2018-07-06 DIAGNOSIS — Z136 Encounter for screening for cardiovascular disorders: Secondary | ICD-10-CM

## 2018-07-06 DIAGNOSIS — C50412 Malignant neoplasm of upper-outer quadrant of left female breast: Secondary | ICD-10-CM | POA: Diagnosis not present

## 2018-07-06 DIAGNOSIS — R5382 Chronic fatigue, unspecified: Secondary | ICD-10-CM

## 2018-07-06 DIAGNOSIS — Z13220 Encounter for screening for lipoid disorders: Secondary | ICD-10-CM

## 2018-07-06 DIAGNOSIS — Z170 Estrogen receptor positive status [ER+]: Secondary | ICD-10-CM

## 2018-07-06 DIAGNOSIS — M79641 Pain in right hand: Secondary | ICD-10-CM | POA: Diagnosis not present

## 2018-07-06 NOTE — Progress Notes (Signed)
CC: Transition of care from Madison Hopkins.  Pt is a breast cancer pt in remission, dx 07/2017 and due for her port removal on 08/28/2018.  Pt c/o ?arthritis or ? Carpal tunnel in right hand, c/o tingling and pain level 6/10.  Using biofreeze type gel to help and it helps a little along with tylenol.  No refills needed on meds.   Recent weight entered but no bp.  No travel outside the [place] or [place] in the past 3 weeks.

## 2018-07-06 NOTE — Patient Instructions (Signed)
     If you have lab work done today you will be contacted with your lab results within the next 2 weeks.  If you have not heard from [place] then please contact us. The fastest way to get your results is to register for My Chart.   IF you received an x-ray today, you will receive an invoice from [facility]. Please contact [facility] at [phone] with questions or concerns regarding your invoice.   IF you received labwork today, you will receive an invoice from [facility]. Please contact [facility] at [phone] with questions or concerns regarding your invoice.   Our billing staff will not be able to assist you with questions regarding bills from these companies.  You will be contacted with the lab results as soon as they are available. The fastest way to get your results is to activate your My Chart account. Instructions are located on the last page of this paperwork. If you have not heard from [place] regarding the results in 2 weeks, please contact this office.    CC

## 2018-07-06 NOTE — Progress Notes (Signed)
Telemedicine Encounter- SOAP NOTE Established Patient  This telephone encounter was conducted with the patient's (or proxy's) verbal consent via audio telecommunications: yes/no: Yes Patient was instructed to have this encounter in a suitably private space; and to only have persons present to whom they give permission to participate. In addition, patient identity was confirmed by use of name plus two identifiers (DOB and address).  I discussed the limitations, risks, security and privacy concerns of performing an evaluation and management service by telephone and the availability of in person appointments. I also discussed with the patient that there may be a patient responsible charge related to this service. The patient expressed understanding and agreed to proceed.  I spent a total of TIME; 0 MIN TO 60 MIN: 25 minutes talking with the patient or their proxy.  CC: right hand pain, fatigue, breast cancer patient  Subjective   Madison Hopkins is a 63 y.o. established patient. Telephone visit today for  HPI  Patient is a breast cancer patient in remission after chemo She was diagnosed June 2019 She states that she is scheduled to have her port removed in 08/28/2018 She states that her port is not functioning currently and was not able to be flushed  She is a right handed female She is having some tingling and pain  She states that her oncologist was told about her right hand pain  She states that her hand is tight if she forms a fist She had tingling and numbness similar to when she was on chemo She is using biofreeze gel to help along with tylenol She uses a stress ball If she is inactive the symptoms are worse  Chronic Fatigue She has been having fatigue since starting chemo She takes vitamin C, vitamin D, an alive over 59! Multivitamin She walks for exercise when she can She is working on her weight loss She eats a plant based diet mostly and very  Little red meat She has a  mild anemia  Wt Readings from Last 3 Encounters:  07/06/18 202 lb (91.6 kg)  05/28/18 198 lb 3.2 oz (89.9 kg)  04/26/18 200 lb (90.7 kg)   Lab Results  Component Value Date   WBC 6.2 04/26/2018   HGB 10.8 (L) 04/26/2018   HCT 34.3 (L) 04/26/2018   MCV 85.5 04/26/2018   PLT 280 04/26/2018     Patient Active Problem List   Diagnosis Date Noted  . Port-A-Cath in place 11/09/2017  . Malignant neoplasm of upper-outer quadrant of left breast in female, estrogen receptor positive ([place]) 08/10/2017    Past Medical History:  Diagnosis Date  . Abnormal Pap smear of vagina 07/28/2016   LGSIL; colpo 07/2016 atrophic squamous cells; colpo 07/2017 - atypia  . Allergy   . Breast cancer ([place])    Metastatic Left breast  . Elevated hemoglobin A1c 07/28/2016   level - 6.1  . Endometriosis   . Low vitamin D level 07/28/2016   level 24.7    Current Outpatient Medications  Medication Sig Dispense Refill  . acyclovir ointment (ZOVIRAX) 5 % Apply 1 application topically every 4 (four) hours. 15 g 2  . anastrozole (ARIMIDEX) 1 MG tablet Take 1 tablet (1 mg total) by mouth daily. 90 tablet 4  . fluticasone (FLONASE) 50 MCG/ACT nasal spray Place 1 spray into both nostrils daily as needed for allergies.    . Multiple Vitamin (MULTIVITAMIN WITH MINERALS) TABS tablet Take 1 tablet by mouth daily after lunch.    Marland Kitchen  Artificial Tear Solution (OPTI-TEARS OP) Place 1 drop into both eyes 3 (three) times daily as needed (for dry/irritated eyes.).    Marland Kitchen cholestyramine (QUESTRAN) 4 GM/DOSE powder Take 1 packet (4 g total) by mouth 2 (two) times daily with a meal. (Patient not taking: Reported on 03/07/2018) 378 g 2  . HYDROcodone-acetaminophen (NORCO) 5-325 MG tablet 1/2 to 1 tablets Q 4 hours prn pain (Patient not taking: Reported on 03/07/2018) 30 tablet 0  . HYDROcodone-acetaminophen (NORCO) 5-325 MG tablet Take 1-2 tablets by mouth every 6 (six) hours as needed for moderate pain or severe pain. (Patient not  taking: Reported on 03/07/2018) 30 tablet 0  . hydrocortisone (ANUSOL-HC) 25 MG suppository Place 1 suppository (25 mg total) rectally 2 (two) times daily. (Patient not taking: Reported on 03/07/2018) 12 suppository 0  . loperamide (IMODIUM) 2 MG capsule Take 1 capsule (2 mg total) by mouth as needed for diarrhea or loose stools. (Patient not taking: Reported on 03/07/2018) 30 capsule 0  . LORazepam (ATIVAN) 1 MG tablet Take 1 mg by mouth as needed for anxiety.    . Misc Natural Products (OSTEO BI-FLEX ADV TRIPLE ST PO) Take 1 tablet by mouth daily after lunch.    . naproxen sodium (ALEVE) 220 MG tablet Take 220 mg by mouth 2 (two) times daily as needed (FOR PAIN.).     Marland Kitchen sulfamethoxazole-trimethoprim (BACTRIM DS,SEPTRA DS) 800-160 MG tablet Take 1 tablet by mouth 2 (two) times daily. (Patient not taking: Reported on 07/06/2018) 14 tablet 0  . tetrahydrozoline (VISINE) 0.05 % ophthalmic solution Place 1 drop into both eyes 3 (three) times daily as needed (for dry eyes.).    Marland Kitchen valACYclovir (VALTREX) 500 MG tablet Take 1 tablet (500 mg total) by mouth 2 (two) times daily. (Patient not taking: Reported on 03/07/2018) 60 tablet 2   No current facility-administered medications for this visit.     Allergies  Allergen Reactions  . Penicillins Nausea Only    Has patient had a PCN reaction causing immediate rash, facial/tongue/throat swelling, SOB or lightheadedness with hypotension: No Has patient had a PCN reaction causing severe rash involving mucus membranes or skin necrosis: No Has patient had a PCN reaction that required hospitalization: No Has patient had a PCN reaction occurring within the last 10 years: Yes--nausea & headache ONLY If all of the above answers are "NO", then may proceed with Cephalosporin use.     Social History   Socioeconomic History  . Marital status: Single    Spouse name: Not on file  . Number of children: 2  . Years of education: Not on file  . Highest education level: Not  on file  Occupational History  . Not on file  Social Needs  . Financial resource strain: Not on file  . Food insecurity:    Worry: Not on file    Inability: Not on file  . Transportation needs:    Medical: Not on file    Non-medical: Not on file  Tobacco Use  . Smoking status: Never Smoker  . Smokeless tobacco: Never Used  Substance and Sexual Activity  . Alcohol use: Not Currently    Alcohol/week: 0.0 standard drinks    Frequency: Never    Comment: occasional  . Drug use: Yes    Types: Marijuana  . Sexual activity: Not Currently    Birth control/protection: Post-menopausal, Surgical    Comment: Hysterectomy  Lifestyle  . Physical activity:    Days per week: Not on file  Minutes per session: Not on file  . Stress: Not on file  Relationships  . Social connections:    Talks on phone: Not on file    Gets together: Not on file    Attends religious service: Not on file    Active member of club or organization: Not on file    Attends meetings of clubs or organizations: Not on file    Relationship status: Not on file  . Intimate partner violence:    Fear of current or ex partner: Not on file    Emotionally abused: Not on file    Physically abused: Not on file    Forced sexual activity: Not on file  Other Topics Concern  . Not on file  Social History Narrative  . Not on file    ROS  Review of Systems  Constitutional: Negative for activity change, appetite change, chills and fever.  HENT: Negative for congestion, nosebleeds, trouble swallowing and voice change.   Respiratory: Negative for cough, shortness of breath and wheezing.   Gastrointestinal: Negative for diarrhea, nausea and vomiting.  Genitourinary: Negative for difficulty urinating, dysuria, flank pain and hematuria.  Musculoskeletal: see hpi Neurological: Negative for dizziness, speech difficulty, light-headedness and numbness.  See HPI. All other review of systems negative.    Objective   Vitals as  reported by the patient: Today's Vitals   07/06/18 0807  Weight: 202 lb (91.6 kg)    Diagnoses and all orders for this visit:  Encounter for lipid screening for cardiovascular disease -     Lipid panel; Future -  Will screen  Continue a heart healthy diet  Chronic fatigue -     VITAMIN D 25 Hydroxy (Vit-D Deficiency, Fractures); Future -    Pt is in remission for cancer with a mild anemia -  Continue multivitamin, exercise as tolerated and continue hydration Will check a vitamin D level and a TSH  Malignant neoplasm of upper-outer quadrant of left breast in female, estrogen receptor positive ([place]) -    Continue with Oncology as instructed   Right hand pain Right hand pain will be assessed further after the port has been removed  Will follow up with evaluation in 3 month      I discussed the assessment and treatment plan with the patient. The patient was provided an opportunity to ask questions and all were answered. The patient agreed with the plan and demonstrated an understanding of the instructions.   The patient was advised to call back or seek an in-person evaluation if the symptoms worsen or if the condition fails to improve as anticipated.  I provided 25 minutes of non-face-to-face time during this encounter.  Forrest Moron, MD  Primary Care at [facility]

## 2018-07-10 ENCOUNTER — Telehealth: Payer: 59 | Admitting: Family Medicine

## 2018-07-26 NOTE — Progress Notes (Signed)
APPNT SCHEDULED  °

## 2018-07-27 ENCOUNTER — Telehealth (HOSPITAL_COMMUNITY): Payer: Self-pay | Admitting: Vascular Surgery

## 2018-07-27 NOTE — Telephone Encounter (Signed)
Left pt vm to keep echo 6/4 and televiist is scheduled 6/5 @ 240. Asked pt to call office to confirm appt changes

## 2018-08-02 ENCOUNTER — Ambulatory Visit (HOSPITAL_COMMUNITY)
Admission: RE | Admit: 2018-08-02 | Discharge: 2018-08-02 | Disposition: A | Discharge status: Home / Self Care | Payer: 59 | Source: Ambulatory Visit | Attending: Internal Medicine | Admitting: Internal Medicine

## 2018-08-02 ENCOUNTER — Other Ambulatory Visit: Payer: Self-pay

## 2018-08-02 ENCOUNTER — Encounter (HOSPITAL_COMMUNITY): Payer: 59 | Admitting: Internal Medicine

## 2018-08-02 DIAGNOSIS — C50412 Malignant neoplasm of upper-outer quadrant of left female breast: Secondary | ICD-10-CM | POA: Insufficient documentation

## 2018-08-02 DIAGNOSIS — Z170 Estrogen receptor positive status [ER+]: Secondary | ICD-10-CM | POA: Diagnosis not present

## 2018-08-02 NOTE — Progress Notes (Signed)
  Echocardiogram 2D Echocardiogram has been performed.  Madison Hopkins 08/02/2018, 11:53 AM

## 2018-08-03 ENCOUNTER — Other Ambulatory Visit: Payer: Self-pay

## 2018-08-03 ENCOUNTER — Ambulatory Visit (HOSPITAL_COMMUNITY)
Admission: RE | Admit: 2018-08-03 | Discharge: 2018-08-03 | Disposition: A | Discharge status: Home / Self Care | Payer: 59 | Source: Ambulatory Visit | Attending: Internal Medicine | Admitting: Internal Medicine

## 2018-08-03 DIAGNOSIS — C50412 Malignant neoplasm of upper-outer quadrant of left female breast: Secondary | ICD-10-CM | POA: Diagnosis not present

## 2018-08-03 DIAGNOSIS — Z170 Estrogen receptor positive status [ER+]: Secondary | ICD-10-CM

## 2018-08-03 DIAGNOSIS — I10 Essential (primary) hypertension: Secondary | ICD-10-CM

## 2018-08-03 NOTE — Addendum Note (Signed)
Encounter addended by: Marlise Eves, RN on: 08/03/2018 3:35 PM  Actions taken: Clinical Note Signed

## 2018-08-03 NOTE — Patient Instructions (Signed)
Please follow up with Dr. Haroldine Laws as needed.

## 2018-08-03 NOTE — Progress Notes (Signed)
Heart Failure TeleHealth Note  Due to national recommendations of social distancing due to [place] 19, Audio/video telehealth visit is felt to be most appropriate for this patient at this time.  See MyChart message from today for patient consent regarding telehealth for [facility].  Date:  08/03/2018   ID:  Madison Hopkins, DOB Feb 16, 1956, MRN [id number]  Location: Home  Provider location: [facility] Type of Visit: Established patient  PCP:  Forrest Moron, MD  Cardiologist:  No primary care provider on file. Primary HF: Evyn Kooyman  Chief Complaint: Cardio-oncology f/u   History of Present Illness:  Madison Hopkins is 63 y.o. female with left breast cancer referred by Dr. Jana Hakim for enrollment into the Cardio-Oncology program during Herceptin therapy.  Denies any h/o heart disease, HTN, HL. Diagnosed with left breast CA 08/07/2017, both positive for a T2 N1, stage IIB invasive ductal carcinoma, grade 3, estrogen receptor weakly positive, progesterone receptor negative, but HER-2 amplified, with an MIB-1 of 80% (a) staging chest CT and bone scan 08/30/2017 showed no evidence of metastatic disease  (1) neoadjuvant chemotherapy will consist of carboplatin, docetaxel, trastuzumab, and Pertuzumab given every 21 days x 6 starting 09/07/2017, perjeta omitted with cycle 2 and 3 due to diarrhea  (2) trastuzumab will be continued to complete 6 months (a) echocardiogram 08/28/2017 showed an ejection fraction in the 60-65% range.  (3) lumpectomy 01/22/18  (4) currently undergoing XRT with Dr. Lisbeth Hopkins. - will complete mid February 2020  (5) will start antiestrogens at the completion of local therapy   She presents via audio/video conferencing for a telehealth visit today.     Has finished four cycles of Docetaxel, Carbo, Trastuzumab and Pertuzumab. Underwent lumpectomy 01/22/18. Finished XRT with Dr. Lisbeth Hopkins in mid February 2020.  Finished Herceptin in early 2/20 after 6 months of treatment. Now on Arimidex Doing well.No SOB, edema.    Echo 08/02/18 EF 60-65% GLS -15.6%  Echo  03/07/18 EF 6-65% Personally reviewed Echo 08/28/17 EF 60-65% GLS -23.7%     HEIRESS Hopkins denies symptoms worrisome for COVID 19.   Past Medical History:  Diagnosis Date   Abnormal Pap smear of vagina 07/28/2016   LGSIL; colpo 07/2016 atrophic squamous cells; colpo 07/2017 - atypia   Allergy    Breast cancer ([place])    Metastatic Left breast   Elevated hemoglobin A1c 07/28/2016   level - 6.1   Endometriosis    Low vitamin D level 07/28/2016   level 24.7   Past Surgical History:  Procedure Laterality Date   ABDOMINAL HYSTERECTOMY     BREAST LUMPECTOMY WITH RADIOACTIVE SEED AND AXILLARY LYMPH NODE DISSECTION Left 01/22/2018   Procedure: LEFT BREAST LUMPECTOMY WITH RADIOACTIVE SEED AND COMPLETE LEFT AXILLARY LYMPH NODE DISSECTION;  Surgeon: Madison Skates, MD;  Location: [place];  Service: General;  Laterality: Left;   CESAREAN SECTION     COLON SURGERY     OOPHORECTOMY     PORTACATH PLACEMENT N/A 09/06/2017   Procedure: INSERTION PORT-A-CATH;  Surgeon: [name], MD;  Location: [place];  Service: General;  Laterality: N/A;   SMALL INTESTINE SURGERY     SPINE SURGERY       Current Outpatient Medications  Medication Sig Dispense Refill   acyclovir ointment (ZOVIRAX) 5 % Apply 1 application topically every 4 (four) hours. 15 g 2   anastrozole (ARIMIDEX) 1 MG tablet Take 1 tablet (1 mg total) by mouth daily. 90 tablet 4   Artificial  Tear Solution (OPTI-TEARS OP) Place 1 drop into both eyes 3 (three) times daily as needed (for dry/irritated eyes.).     cholestyramine (QUESTRAN) 4 GM/DOSE powder Take 1 packet (4 g total) by mouth 2 (two) times daily with a meal. (Patient not taking: Reported on 03/07/2018) 378 g 2   fluticasone (FLONASE) 50 MCG/ACT nasal spray Place 1 spray into both nostrils daily  as needed for allergies.     HYDROcodone-acetaminophen (NORCO) 5-325 MG tablet 1/2 to 1 tablets Q 4 hours prn pain (Patient not taking: Reported on 03/07/2018) 30 tablet 0   HYDROcodone-acetaminophen (NORCO) 5-325 MG tablet Take 1-2 tablets by mouth every 6 (six) hours as needed for moderate pain or severe pain. (Patient not taking: Reported on 03/07/2018) 30 tablet 0   hydrocortisone (ANUSOL-HC) 25 MG suppository Place 1 suppository (25 mg total) rectally 2 (two) times daily. (Patient not taking: Reported on 03/07/2018) 12 suppository 0   loperamide (IMODIUM) 2 MG capsule Take 1 capsule (2 mg total) by mouth as needed for diarrhea or loose stools. (Patient not taking: Reported on 03/07/2018) 30 capsule 0   LORazepam (ATIVAN) 1 MG tablet Take 1 mg by mouth as needed for anxiety.     Misc Natural Products (OSTEO BI-FLEX ADV TRIPLE ST PO) Take 1 tablet by mouth daily after lunch.     Multiple Vitamin (MULTIVITAMIN WITH MINERALS) TABS tablet Take 1 tablet by mouth daily after lunch.     naproxen sodium (ALEVE) 220 MG tablet Take 220 mg by mouth 2 (two) times daily as needed (FOR PAIN.).      sulfamethoxazole-trimethoprim (BACTRIM DS,SEPTRA DS) 800-160 MG tablet Take 1 tablet by mouth 2 (two) times daily. (Patient not taking: Reported on 07/06/2018) 14 tablet 0   tetrahydrozoline (VISINE) 0.05 % ophthalmic solution Place 1 drop into both eyes 3 (three) times daily as needed (for dry eyes.).     valACYclovir (VALTREX) 500 MG tablet Take 1 tablet (500 mg total) by mouth 2 (two) times daily. (Patient not taking: Reported on 03/07/2018) 60 tablet 2   No current facility-administered medications for this encounter.     Allergies:   Penicillins   Social History:  The patient  reports that she has never smoked. She has never used smokeless tobacco. She reports previous alcohol use. She reports current drug use. Drug: Marijuana.   Family History:  The patient's family history includes Alcoholism in her  mother; Cancer in her father; Cirrhosis in her mother; Lung cancer in her father; Mental illness in her mother.   ROS:  Please see the history of present illness.   All other systems are personally reviewed and negative.   Exam:  (Video/Tele Health Call; Exam is subjective and or/visual.) General:  Speaks in full sentences. No resp difficulty. Lungs: Normal respiratory effort with conversation.  Abdomen: Non-distended per patient report Extremities: Pt denies edema. Neuro: Alert & oriented x 3.   Recent Labs: 04/26/2018: ALT 17; BUN 9; Creatinine, Ser 0.82; Hemoglobin 10.8; Platelets 280; Potassium 3.4; Sodium 141  Personally reviewed   Wt Readings from Last 3 Encounters:  07/06/18 91.6 kg (202 lb)  05/28/18 89.9 kg (198 lb 3.2 oz)  04/26/18 90.7 kg (200 lb)      ASSESSMENT AND PLAN:  1. Left Breast Cancer - left breast CA 08/07/2017, both positive for a T2 N1, stage IIB invasive ductal carcinoma, grade 3, estrogen receptor weakly positive, progesterone receptor negative, but HER-2 amplified, with an MIB-1 of 80% - Complete Herceptin 2/20 -  I reviewed echos personally. EF and Doppler parameters stable. No HF on exam. Can f/u as needed.   2. HTN - BP well controlled.   COVID screen The patient does not have any symptoms that suggest any further testing/ screening at this time.  Social distancing reinforced today.  Recommended follow-up:  As above  Relevant cardiac medications were reviewed at length with the patient today.   The patient does not have concerns regarding their medications at this time.   The following changes were made today:  As above  Today, I have spent 12 minutes with the patient with telehealth technology discussing the above issues .    Signed, Glori Bickers, MD  08/03/2018 3:26 PM  [facility] [place] [address] [facility] and [place] [address] [phone] (office) [phone] (fax)

## 2018-08-03 NOTE — Progress Notes (Signed)
F/u PRN       No med changes, no needs at this time.

## 2018-08-20 ENCOUNTER — Encounter (HOSPITAL_BASED_OUTPATIENT_CLINIC_OR_DEPARTMENT_OTHER): Payer: Self-pay

## 2018-08-20 ENCOUNTER — Other Ambulatory Visit: Payer: Self-pay

## 2018-08-22 NOTE — Progress Notes (Signed)
Ensure pre surgery drink given with instructions to complete by 0430 dos, pt verbalized understanding. 

## 2018-08-24 ENCOUNTER — Other Ambulatory Visit (HOSPITAL_COMMUNITY)
Admission: RE | Admit: 2018-08-24 | Discharge: 2018-08-24 | Disposition: A | Discharge status: Home / Self Care | Payer: 59 | Source: Ambulatory Visit | Attending: General Surgery | Admitting: General Surgery

## 2018-08-24 DIAGNOSIS — Z1159 Encounter for screening for other viral diseases: Secondary | ICD-10-CM | POA: Insufficient documentation

## 2018-08-24 LAB — SARS CORONAVIRUS 2 (TAT 6-24 HRS): SARS Coronavirus 2: NEGATIVE

## 2018-08-25 NOTE — H&P (Signed)
Madison Hopkins Location: [facility] Patient #: [id number] DOB: August 02, 1955 Single / Language: [name] / Race: Black or African American Female       History of Present Illness  The patient is a 63 year old female who is evaluated for a postop visit. Dr. Lisbeth Hopkins and Dr. Jana Hopkins are involved in her care Dr. Quincy Hopkins is her gynecologist  She was diagnosed with locally advanced cancer of the left breast in June, 2019. She had a palpable mass in the upper outer quadrant of the left breast and palpable mass in the left axilla. Imaging studies showed a 2.2 cm mass at the 2 o'clock position left breast and 2 enlarged left neck lymph nodes. Biopsy showed grade 3 invasive ductal carcinoma, ER week. PR 0. HER-2 positive lymph node was positive. MRI showed an area of enhancement as big as 5 cm. MRI showed 3 enlarged lymph nodes. The right breast and axilla looked normal. Port-A-Cath was placed and she completed neoadjuvant cytotoxic chemotherapy. MRI was performed on December 27, 2017 showing a complete radiologic response the left breast and the lymph nodes were smaller but still abnormal.  Following consensus recommendation she was taken to the operating room on January 22, 2018 underwent a left breast lumpectomy with are cell and a complete axillary lymph node dissection. Final pathology showed that the cancer was only 9 mm in diameter but is now HER-2 negative. Metastatic cancer was found in 1 out of 11 lymph nodes. She is aware of this resolved and I have discussed it with her and given her a copy. She is scheduled to see Dr. Lisbeth Hopkins  to plan radiation therap  Plan: Referred to physical therapy for left shoulder range of motion exercises Exercise sheet was given to patient for twice a day use Increase exercise with daily ambulation in the neighborhood Okay to proceed with radiation therapy in January Anticipate one year of Herceptin therapy See me in 4 months, sooner if  there are any wound problems Remove port a cath summer 2020    Allergies Penicillins  Allergies Reconciled   Medication History  LORazepam (0.'5MG'$  Tablet, Oral) Active. valACYclovir HCl ('500MG'$  Tablet, Oral) Active. Prochlorperazine Maleate ('10MG'$  Tablet, Oral) Active. Lidocaine-Prilocaine (2.5-2.5% Cream, External) Active. Fluconazole ('100MG'$  Tablet, Oral) Active. Fluticasone Propionate (50MCG/ACT Suspension, Nasal) Active. Levocetirizine Dihydrochloride ('5MG'$  Tablet, Oral) Active. Glucosamine Chondroitin Complx (500-'400MG'$  Tablet, Oral) Active. Medications Reconciled  Vitals 02/06/2018 4:42 PM Weight: 194.4 lb Height: 69in Body Surface Area: 2.04 m Body Mass Index: 28.71 kg/m  Temp.: 98.34F  Pulse: 89 (Regular)  BP: 138/76(Sitting, Left Arm, Standard)       Physical Exam  General Note: Pleasant. Upbeat. Husband with her throughout the encounter.   Head and Neck Note: Her hair is back   CV: RRR, no murmer or ectopy Lungs: clear Breast Note: Left breast is examined. Lumpectomy scar upper outer quadrant and left axillar scar looked good. Soft. No hematoma or seroma. No swelling. Drain is removed. A little bit sensitive under the arm. Left shoulder range of motion is limited to about 90-100 abduction. Arm not swollen     Assessment & Plan  BREAST CANCER, STAGE 2, LEFT (C50.912) Story: Left breast biopsy and left axillary lymph node biopsy - 08/07/2017 ([id number]) - IDC, grade 3, ER - 30%, PR - 0%, Ki67 - 80%, Her2 - POSITIVE, 1/1 nodes  Oncology - Madison Hopkins and Madison Hopkins  Current Plans Follow up with [place] in the office in 4 MONTHS. proceed with radiation therapy To  be followed by one year herceptin To be followed by port removal     Madison Hopkins. Madison Hopkins, M.D., [facility] General and Minimally invasive Surgery Breast and Colorectal Surgery Office:   [phone] Pager:   [phone]

## 2018-08-28 ENCOUNTER — Ambulatory Visit (HOSPITAL_BASED_OUTPATIENT_CLINIC_OR_DEPARTMENT_OTHER): Payer: 59 | Admitting: Certified Registered Nurse Anesthetist

## 2018-08-28 ENCOUNTER — Encounter (HOSPITAL_BASED_OUTPATIENT_CLINIC_OR_DEPARTMENT_OTHER)
Admission: RE | Disposition: A | Discharge status: Home / Self Care | Payer: Self-pay | Source: Ambulatory Visit | Attending: General Surgery

## 2018-08-28 ENCOUNTER — Encounter (HOSPITAL_BASED_OUTPATIENT_CLINIC_OR_DEPARTMENT_OTHER): Payer: Self-pay | Admitting: *Deleted

## 2018-08-28 ENCOUNTER — Ambulatory Visit (HOSPITAL_BASED_OUTPATIENT_CLINIC_OR_DEPARTMENT_OTHER)
Admission: RE | Admit: 2018-08-28 | Discharge: 2018-08-28 | Disposition: A | Discharge status: Home / Self Care | Payer: 59 | Source: Ambulatory Visit | Attending: General Surgery | Admitting: General Surgery

## 2018-08-28 ENCOUNTER — Other Ambulatory Visit: Payer: Self-pay

## 2018-08-28 DIAGNOSIS — Z79899 Other long term (current) drug therapy: Secondary | ICD-10-CM | POA: Diagnosis not present

## 2018-08-28 DIAGNOSIS — Z6830 Body mass index (BMI) 30.0-30.9, adult: Secondary | ICD-10-CM | POA: Insufficient documentation

## 2018-08-28 DIAGNOSIS — Z9221 Personal history of antineoplastic chemotherapy: Secondary | ICD-10-CM | POA: Diagnosis not present

## 2018-08-28 DIAGNOSIS — Z880 Allergy status to penicillin: Secondary | ICD-10-CM | POA: Diagnosis not present

## 2018-08-28 DIAGNOSIS — Z95828 Presence of other vascular implants and grafts: Secondary | ICD-10-CM

## 2018-08-28 DIAGNOSIS — Z170 Estrogen receptor positive status [ER+]: Secondary | ICD-10-CM

## 2018-08-28 DIAGNOSIS — Z853 Personal history of malignant neoplasm of breast: Secondary | ICD-10-CM | POA: Diagnosis not present

## 2018-08-28 DIAGNOSIS — Z923 Personal history of irradiation: Secondary | ICD-10-CM | POA: Insufficient documentation

## 2018-08-28 DIAGNOSIS — C50412 Malignant neoplasm of upper-outer quadrant of left female breast: Secondary | ICD-10-CM

## 2018-08-28 DIAGNOSIS — Z452 Encounter for adjustment and management of vascular access device: Secondary | ICD-10-CM | POA: Insufficient documentation

## 2018-08-28 DIAGNOSIS — E669 Obesity, unspecified: Secondary | ICD-10-CM | POA: Insufficient documentation

## 2018-08-28 HISTORY — PX: PORT-A-CATH REMOVAL: SHX5289

## 2018-08-28 SURGERY — REMOVAL PORT-A-CATH
Anesthesia: Monitor Anesthesia Care | Laterality: Right

## 2018-08-28 MED ORDER — SODIUM CHLORIDE 0.9% FLUSH: 3.0000 mL | Freq: Two times a day (BID) | INTRAVENOUS | Status: DC

## 2018-08-28 MED ORDER — MIDAZOLAM HCL 2 MG/2ML IJ SOLN: 1.0000 mg | INTRAMUSCULAR | Status: DC | PRN

## 2018-08-28 MED ORDER — OXYCODONE HCL 5 MG PO TABS: 5.0000 mg | ORAL_TABLET | Freq: Once | ORAL | Status: DC | PRN

## 2018-08-28 MED ORDER — PROPOFOL 10 MG/ML IV BOLUS
INTRAVENOUS | Status: AC
  Filled 2018-08-28: qty 40

## 2018-08-28 MED ORDER — OXYCODONE HCL 5 MG/5ML PO SOLN: 5.0000 mg | Freq: Once | ORAL | Status: DC | PRN

## 2018-08-28 MED ORDER — PROMETHAZINE HCL 25 MG/ML IJ SOLN: 6.2500 mg | INTRAMUSCULAR | Status: DC | PRN

## 2018-08-28 MED ORDER — ONDANSETRON HCL 4 MG/2ML IJ SOLN
INTRAMUSCULAR | Status: DC | PRN
  Administered 2018-08-28: 4 mg via INTRAVENOUS

## 2018-08-28 MED ORDER — PROPOFOL 10 MG/ML IV BOLUS
INTRAVENOUS | Status: DC | PRN
  Administered 2018-08-28 (×2): 20 mg via INTRAVENOUS

## 2018-08-28 MED ORDER — HYDROMORPHONE HCL 1 MG/ML IJ SOLN: 0.2500 mg | INTRAMUSCULAR | Status: DC | PRN

## 2018-08-28 MED ORDER — ONDANSETRON HCL 4 MG/2ML IJ SOLN
INTRAMUSCULAR | Status: AC
  Filled 2018-08-28: qty 2

## 2018-08-28 MED ORDER — LACTATED RINGERS IV SOLN
INTRAVENOUS | Status: DC
  Administered 2018-08-28: 08:00:00 via INTRAVENOUS

## 2018-08-28 MED ORDER — LIDOCAINE HCL (CARDIAC) PF 100 MG/5ML IV SOSY
PREFILLED_SYRINGE | INTRAVENOUS | Status: DC | PRN
  Administered 2018-08-28: 40 mg via INTRAVENOUS

## 2018-08-28 MED ORDER — FENTANYL CITRATE (PF) 100 MCG/2ML IJ SOLN
INTRAMUSCULAR | Status: DC | PRN
  Administered 2018-08-28 (×2): 25 ug via INTRAVENOUS
  Administered 2018-08-28: 50 ug via INTRAVENOUS

## 2018-08-28 MED ORDER — FENTANYL CITRATE (PF) 100 MCG/2ML IJ SOLN
INTRAMUSCULAR | Status: AC
  Filled 2018-08-28: qty 2

## 2018-08-28 MED ORDER — MIDAZOLAM HCL 2 MG/2ML IJ SOLN
INTRAMUSCULAR | Status: AC
  Filled 2018-08-28: qty 2

## 2018-08-28 MED ORDER — PROPOFOL 500 MG/50ML IV EMUL
INTRAVENOUS | Status: DC | PRN
  Administered 2018-08-28: 75 ug/kg/min via INTRAVENOUS

## 2018-08-28 MED ORDER — CHLORHEXIDINE GLUCONATE CLOTH 2 % EX PADS: 6.0000 | MEDICATED_PAD | Freq: Once | CUTANEOUS | Status: DC

## 2018-08-28 MED ORDER — MIDAZOLAM HCL 2 MG/2ML IJ SOLN
INTRAMUSCULAR | Status: DC | PRN
  Administered 2018-08-28: 2 mg via INTRAVENOUS

## 2018-08-28 MED ORDER — LIDOCAINE 2% (20 MG/ML) 5 ML SYRINGE
INTRAMUSCULAR | Status: AC
  Filled 2018-08-28: qty 5

## 2018-08-28 MED ORDER — FENTANYL CITRATE (PF) 100 MCG/2ML IJ SOLN: 50.0000 ug | INTRAMUSCULAR | Status: DC | PRN

## 2018-08-28 MED ORDER — LIDOCAINE-EPINEPHRINE (PF) 1 %-1:200000 IJ SOLN
INTRAMUSCULAR | Status: DC | PRN
  Administered 2018-08-28: 16 mL

## 2018-08-28 SURGICAL SUPPLY — 37 items
ADH SKN CLS APL DERMABOND .7 (GAUZE/BANDAGES/DRESSINGS) ×1
APL PRP STRL LF DISP 70% ISPRP (MISCELLANEOUS) ×1
APL SKNCLS STERI-STRIP NONHPOA (GAUZE/BANDAGES/DRESSINGS)
BENZOIN TINCTURE PRP APPL 2/3 (GAUZE/BANDAGES/DRESSINGS) IMPLANT
BLADE HEX COATED 2.75 (ELECTRODE) ×2 IMPLANT
BLADE SURG 15 STRL LF DISP TIS (BLADE) ×1 IMPLANT
BLADE SURG 15 STRL SS (BLADE) ×2
CHLORAPREP W/TINT 26 (MISCELLANEOUS) ×2 IMPLANT
COVER BACK TABLE REUSABLE LG (DRAPES) ×2 IMPLANT
COVER MAYO STAND REUSABLE (DRAPES) ×2 IMPLANT
COVER WAND RF STERILE (DRAPES) IMPLANT
DECANTER SPIKE VIAL GLASS SM (MISCELLANEOUS) ×1 IMPLANT
DERMABOND ADVANCED (GAUZE/BANDAGES/DRESSINGS) ×1
DERMABOND ADVANCED .7 DNX12 (GAUZE/BANDAGES/DRESSINGS) ×1 IMPLANT
DRAPE LAPAROTOMY 100X72 PEDS (DRAPES) ×2 IMPLANT
DRAPE UTILITY XL STRL (DRAPES) ×2 IMPLANT
DRSG TEGADERM 4X4.75 (GAUZE/BANDAGES/DRESSINGS) IMPLANT
ELECT REM PT RETURN 9FT ADLT (ELECTROSURGICAL) ×2
ELECTRODE REM PT RTRN 9FT ADLT (ELECTROSURGICAL) ×1 IMPLANT
GAUZE SPONGE 4X4 12PLY STRL LF (GAUZE/BANDAGES/DRESSINGS) IMPLANT
GLOVE EUDERMIC 7 POWDERFREE (GLOVE) ×2 IMPLANT
GOWN STRL REUS W/ TWL LRG LVL3 (GOWN DISPOSABLE) ×1 IMPLANT
GOWN STRL REUS W/ TWL XL LVL3 (GOWN DISPOSABLE) ×1 IMPLANT
GOWN STRL REUS W/TWL LRG LVL3 (GOWN DISPOSABLE) ×2
GOWN STRL REUS W/TWL XL LVL3 (GOWN DISPOSABLE) ×2
NDL HYPO 25X1 1.5 SAFETY (NEEDLE) ×1 IMPLANT
NEEDLE HYPO 25X1 1.5 SAFETY (NEEDLE) ×2 IMPLANT
PACK BASIN DAY SURGERY FS (CUSTOM PROCEDURE TRAY) ×2 IMPLANT
PENCIL BUTTON HOLSTER BLD 10FT (ELECTRODE) ×2 IMPLANT
SLEEVE SCD COMPRESS KNEE MED (MISCELLANEOUS) ×2 IMPLANT
STRIP CLOSURE SKIN 1/2X4 (GAUZE/BANDAGES/DRESSINGS) IMPLANT
SUT MNCRL AB 4-0 PS2 18 (SUTURE) ×2 IMPLANT
SUT VICRYL 3-0 CR8 SH (SUTURE) ×2 IMPLANT
SYR 10ML LL (SYRINGE) ×2 IMPLANT
TOWEL GREEN STERILE FF (TOWEL DISPOSABLE) ×2 IMPLANT
TUBE CONNECTING 20X1/4 (TUBING) IMPLANT
YANKAUER SUCT BULB TIP NO VENT (SUCTIONS) IMPLANT

## 2018-08-28 NOTE — Transfer of Care (Signed)
Immediate Anesthesia Transfer of Care Note  Patient: Madison Hopkins  Procedure(s) Performed: REMOVAL PORT-A-CATH (Right )  Patient Location: PACU  Anesthesia Type:MAC  Level of Consciousness: awake, alert , oriented, drowsy and patient cooperative  Airway & Oxygen Therapy: Patient Spontanous Breathing and Patient connected to face mask oxygen  Post-op Assessment: Report given to RN and Post -op Vital signs reviewed and stable  Post vital signs: Reviewed and stable  Last Vitals:  Vitals Value Taken Time  BP    Temp    Pulse    Resp    SpO2      Last Pain:  Vitals:   08/28/18 0702  TempSrc: Oral  PainSc: 0-No pain      Patients Stated Pain Goal: 0 (32/02/33 4356)  Complications: No apparent anesthesia complications

## 2018-08-28 NOTE — Interval H&P Note (Signed)
History and Physical Interval Note:  08/28/2018 7:18 AM  Madison Hopkins  has presented today for surgery, with the diagnosis of BREAST CANCER.  The various methods of treatment have been discussed with the patient and family. After consideration of risks, benefits and other options for treatment, the patient has consented to  Procedure(s): REMOVAL PORT-A-CATH (N/A) as a surgical intervention.  The patient's history has been reviewed, patient examined, no change in status, stable for surgery.  I have reviewed the patient's chart and labs.  Questions were answered to the patient's satisfaction.     Adin Hector

## 2018-08-28 NOTE — Op Note (Signed)
Patient Name:           Madison Hopkins   Date of Surgery:        08/28/2018  Pre op Diagnosis:      Port-A-Cath in place                                       Left breast cancer  Post op Diagnosis:    Same  Procedure:                 Removal of Port-A-Cath  Surgeon:                     Edsel Petrin. Dalbert Batman, M.D., FACS  Assistant:                      OR staff  Operative Indications:   The patient is a 63 year old female  who is brought to the operating room for removal of her Port-A-Cath. Dr. Lisbeth Renshaw and Dr. Jana Hakim are involved in her care Dr. Quincy Simmonds is her gynecologist      She was diagnosed with locally advanced cancer of the left breast in June, 2019. She had a palpable mass in the upper outer quadrant of the left breast and palpable mass in the left axilla. Imaging studies showed a 2.2 cm mass at the 2 o'clock position left breast and 2 enlarged left neck lymph nodes. Biopsy showed grade 3 invasive ductal carcinoma, ER week. PR 0. HER-2 positive lymph node was positive. MRI showed an area of enhancement as big as 5 cm. MRI showed 3 enlarged lymph nodes. The right breast and axilla looked normal. Port-A-Cath was placed and she completed neoadjuvant cytotoxic chemotherapy. MRI was performed on December 27, 2017 showing a complete radiologic response the left breast and the lymph nodes were smaller but still abnormal.      Following consensus recommendation she was taken to the operating room on January 22, 2018 underwent a left breast lumpectomy and a complete axillary lymph node dissection. Final pathology showed that the cancer was only 9 mm in diameter but is now HER-2 negative. Metastatic cancer was found in 1 out of 11 lymph nodes.  She has completed radiation therapy and 1 year of Herceptin chemotherapy. She was referred by oncology for Port-A-Cath removal  Operative Findings:       The port and catheter were removed intact.  There was no bleeding or signs of  infection.  Procedure in Detail:          The patient was brought to the operating room.  She was monitored and sedated by the anesthesia department.  The right neck and infraclavicular area were prepped and draped in a sterile fashion.  Surgical timeout was performed.  1% Xylocaine with epinephrine was used as a local infiltration anesthetic.  Transverse incision was made above the port.  This was through the previous scar.  The port and catheter were dissected out and removed intact.  There was no bleeding or infection.  Subcutaneous tissue was closed with 3-0 Vicryl and the skin closed with subcuticular 4-0 Monocryl and Dermabond.  The patient tolerated the procedure well and was taken to PACU in stable condition.  EBL 2 cc.  Counts correct.  Complications none.     Edsel Petrin. Dalbert Batman, M.D., FACS General and Minimally Invasive Surgery Breast and Colorectal Surgery  08/28/2018 8:07 AM

## 2018-08-28 NOTE — Anesthesia Preprocedure Evaluation (Signed)
Anesthesia Evaluation  Patient identified by MRN, date of birth, ID band Patient awake    Reviewed: Allergy & Precautions, NPO status , Patient's Chart, lab work & pertinent test results  Airway Mallampati: I  TM Distance: >3 FB Neck ROM: Full    Dental no notable dental hx.    Pulmonary    Pulmonary exam normal breath sounds clear to auscultation       Cardiovascular Normal cardiovascular exam Rhythm:Regular Rate:Normal     Neuro/Psych    GI/Hepatic   Endo/Other    Renal/GU      Musculoskeletal   Abdominal (+) + obese,   Peds  Hematology   Anesthesia Other Findings   Reproductive/Obstetrics                             Anesthesia Physical  Anesthesia Plan  ASA: II  Anesthesia Plan: MAC   Post-op Pain Management:    Induction: Intravenous  PONV Risk Score and Plan: 2 and Ondansetron, Midazolam and Treatment may vary due to age or medical condition  Airway Management Planned: Simple Face Mask  Additional Equipment:   Intra-op Plan:   Post-operative Plan:   Informed Consent: I have reviewed the patients History and Physical, chart, labs and discussed the procedure including the risks, benefits and alternatives for the proposed anesthesia with the patient or authorized representative who has indicated his/her understanding and acceptance.       Plan Discussed with: CRNA and Surgeon  Anesthesia Plan Comments:         Anesthesia Quick Evaluation

## 2018-08-28 NOTE — Discharge Instructions (Signed)
[facility] [place] surgery instructions:  Stay in the house for 24 hours You may shower tomorrow The clear plastic superglue will wear off in about 3 weeks You may walk around the block tomorrow You may drive your car in 1 to 2 days The wound should be just a little sore.  You will not need any prescription narcotic pain medicine See pain medicine instructions below  Continue follow-up with Dr. Jana Hakim  Repeat bilateral mammograms in October or November of this year See Dr. Dalbert Batman in November after the mammograms are done          Managing Your Pain After Surgery Without Opioids    Thank you for participating in our program to help patients manage their pain after surgery without opioids. This is part of our effort to provide you with the best care possible, without exposing you or your family to the risk that opioids pose.  What pain can I expect after surgery? You can expect to have some pain after surgery. This is normal. The pain is typically worse the day after surgery, and quickly begins to get better. Many studies have found that many patients are able to manage their pain after surgery with Over-the-Counter (OTC) medications such as Tylenol and Motrin. If you have a condition that does not allow you to take Tylenol or Motrin, notify your surgical team.  How will I manage my pain? The best strategy for controlling your pain after surgery is around the clock pain control with Tylenol (acetaminophen) and Motrin (ibuprofen or Advil). Alternating these medications with each other allows you to maximize your pain control. In addition to Tylenol and Motrin, you can use heating pads or ice packs on your incisions to help reduce your pain.  How will I alternate your regular strength over-the-counter pain medication? You will take a dose of pain medication every three hours. ; Start by taking 650 mg of Tylenol (2 pills of 325 mg) ; 3 hours later take 600 mg of Motrin (3  pills of 200 mg) ; 3 hours after taking the Motrin take 650 mg of Tylenol ; 3 hours after that take 600 mg of Motrin.   - 1 -  See example - if your first dose of Tylenol is at 12:00 PM   12:00 PM Tylenol 650 mg (2 pills of 325 mg)  3:00 PM Motrin 600 mg (3 pills of 200 mg)  6:00 PM Tylenol 650 mg (2 pills of 325 mg)  9:00 PM Motrin 600 mg (3 pills of 200 mg)  Continue alternating every 3 hours   We recommend that you follow this schedule around-the-clock for at least 3 days after surgery, or until you feel that it is no longer needed. Use the table on the last page of this handout to keep track of the medications you are taking. Important: Do not take more than 3000mg  of Tylenol or 3200mg  of Motrin in a 24-hour period. Do not take ibuprofen/Motrin if you have a history of bleeding stomach ulcers, severe kidney disease, &/or actively taking a blood thinner  What if I still have pain? If you have pain that is not controlled with the over-the-counter pain medications (Tylenol and Motrin or Advil) you might have what we call breakthrough pain. You will receive a prescription for a small amount of an opioid pain medication such as Oxycodone, Tramadol, or Tylenol with Codeine. Use these opioid pills in the first 24 hours after surgery if you have breakthrough pain. Do not take  more than 1 pill every 4-6 hours.  If you still have uncontrolled pain after using all opioid pills, don't hesitate to call our staff using the number provided. We will help make sure you are managing your pain in the best way possible, and if necessary, we can provide a prescription for additional pain medication.   Day 1    Time  Name of Medication Number of pills taken  Amount of Acetaminophen  Pain Level   Comments  AM PM       AM PM       AM PM       AM PM       AM PM       AM PM       AM PM       AM PM       Total Daily amount of Acetaminophen Do not take more than  3,000 mg per day      Day 2     Time  Name of Medication Number of pills taken  Amount of Acetaminophen  Pain Level   Comments  AM PM       AM PM       AM PM       AM PM       AM PM       AM PM       AM PM       AM PM       Total Daily amount of Acetaminophen Do not take more than  3,000 mg per day      Day 3    Time  Name of Medication Number of pills taken  Amount of Acetaminophen  Pain Level   Comments  AM PM       AM PM       AM PM       AM PM          AM PM       AM PM       AM PM       AM PM       Total Daily amount of Acetaminophen Do not take more than  3,000 mg per day      Day 4    Time  Name of Medication Number of pills taken  Amount of Acetaminophen  Pain Level   Comments  AM PM       AM PM       AM PM       AM PM       AM PM       AM PM       AM PM       AM PM       Total Daily amount of Acetaminophen Do not take more than  3,000 mg per day      Day 5    Time  Name of Medication Number of pills taken  Amount of Acetaminophen  Pain Level   Comments  AM PM       AM PM       AM PM       AM PM       AM PM       AM PM       AM PM       AM PM       Total Daily amount of Acetaminophen Do not take more than  3,000 mg  per day       Day 6    Time  Name of Medication Number of pills taken  Amount of Acetaminophen  Pain Level  Comments  AM PM       AM PM       AM PM       AM PM       AM PM       AM PM       AM PM       AM PM       Total Daily amount of Acetaminophen Do not take more than  3,000 mg per day      Day 7    Time  Name of Medication Number of pills taken  Amount of Acetaminophen  Pain Level   Comments  AM PM       AM PM       AM PM       AM PM       AM PM       AM PM       AM PM       AM PM       Total Daily amount of Acetaminophen Do not take more than  3,000 mg per day        For additional information about how and where to safely dispose of unused opioid medications -  [employer]  Disclaimer: This document contains information and/or instructional materials adapted from [employer] for the typical patient with your condition. It does not replace medical advice from your health care provider because your experience may differ from that of the typical patient. Talk to your health care provider if you have any questions about this document, your condition or your treatment plan. Adapted from [place] Instructions  Activity: Get plenty of rest for the remainder of the day. A responsible individual must stay with you for 24 hours following the procedure.  For the next 24 hours, DO NOT: -Drive a car -Paediatric nurse -Drink alcoholic beverages -Take any medication unless instructed by your physician -Make any legal decisions or sign important papers.  Meals: Start with liquid foods such as gelatin or soup. Progress to regular foods as tolerated. Avoid greasy, spicy, heavy foods. If nausea and/or vomiting occur, drink only clear liquids until the nausea and/or vomiting subsides. Call your physician if vomiting continues.  Special Instructions/Symptoms: Your throat may feel dry or sore from the anesthesia or the breathing tube placed in your throat during surgery. If this causes discomfort, gargle with warm salt water. The discomfort should disappear within 24 hours.  If you had a scopolamine patch placed behind your ear for the management of post- operative nausea and/or vomiting:  1. The medication in the patch is effective for 72 hours, after which it should be removed.  Wrap patch in a tissue and discard in the trash. Wash hands thoroughly with soap and water. 2. You may remove the patch earlier than 72 hours if you experience unpleasant side effects which may include dry mouth, dizziness or visual disturbances. 3. Avoid touching the patch. Wash your hands with soap and  water after contact with the patch.

## 2018-08-28 NOTE — Anesthesia Postprocedure Evaluation (Signed)
Anesthesia Post Note  Patient: DELORIA BRASSFIELD  Procedure(s) Performed: REMOVAL PORT-A-CATH (Right )     Patient location during evaluation: PACU Anesthesia Type: MAC Level of consciousness: awake and alert Pain management: pain level controlled Vital Signs Assessment: post-procedure vital signs reviewed and stable Respiratory status: spontaneous breathing, nonlabored ventilation and respiratory function stable Cardiovascular status: stable and blood pressure returned to baseline Postop Assessment: no apparent nausea or vomiting Anesthetic complications: no    Last Vitals:  Vitals:   08/28/18 0830 08/28/18 0853  BP:  (!) 181/79  Pulse: 70 71  Resp: 14 18  Temp:  36.9 C  SpO2: 100% 100%    Last Pain:  Vitals:   08/28/18 0853  TempSrc:   PainSc: 0-No pain                 Lynda Rainwater

## 2018-08-29 ENCOUNTER — Ambulatory Visit: Payer: 59 | Admitting: Obstetrics and Gynecology

## 2018-08-30 ENCOUNTER — Encounter (HOSPITAL_BASED_OUTPATIENT_CLINIC_OR_DEPARTMENT_OTHER): Payer: Self-pay | Admitting: General Surgery

## 2018-09-05 ENCOUNTER — Ambulatory Visit: Payer: 59 | Admitting: Family Medicine

## 2018-09-05 ENCOUNTER — Other Ambulatory Visit: Payer: Self-pay

## 2018-09-05 ENCOUNTER — Ambulatory Visit (INDEPENDENT_AMBULATORY_CARE_PROVIDER_SITE_OTHER): Payer: 59

## 2018-09-05 ENCOUNTER — Encounter: Payer: Self-pay | Admitting: Family Medicine

## 2018-09-05 VITALS — BP 136/84 | HR 83 | Temp 98.7°F | Resp 17 | Ht 68.0 in | Wt 205.4 lb

## 2018-09-05 DIAGNOSIS — M79641 Pain in right hand: Secondary | ICD-10-CM

## 2018-09-05 MED ORDER — MELOXICAM 7.5 MG PO TABS: 7.5000 mg | ORAL_TABLET | Freq: Every day | ORAL | 0 refills | Status: DC

## 2018-09-05 NOTE — Progress Notes (Signed)
Established Patient Office Visit  Subjective:  Patient ID: Madison Hopkins, female    DOB: 1955/10/19  Age: 63 y.o. MRN: [id number]  CC:  Chief Complaint  Patient presents with  . rt hand pain    follow up.  Still the same and thinks arthritis is setting in     HPI Madison Hopkins presents for   Numbness and pain in the thumb and 5th digit She states that the brace helps She thinks it is arthritis Symptoms started March 2020 She states that she finished her chemo   She reports that she has weakness and has a hard time with opening bottles and cans  She has reduced grips strength She is right hand dominant She does a lot of writing   Past Medical History:  Diagnosis Date  . Abnormal Pap smear of vagina 07/28/2016   LGSIL; colpo 07/2016 atrophic squamous cells; colpo 07/2017 - atypia  . Allergy   . Breast cancer ([facility])    Metastatic Left breast  . Elevated hemoglobin A1c 07/28/2016   level - 6.1  . Endometriosis   . Low vitamin D level 07/28/2016   level 24.7    Past Surgical History:  Procedure Laterality Date  . ABDOMINAL HYSTERECTOMY    . BREAST LUMPECTOMY WITH RADIOACTIVE SEED AND AXILLARY LYMPH NODE DISSECTION Left 01/22/2018   Procedure: LEFT BREAST LUMPECTOMY WITH RADIOACTIVE SEED AND COMPLETE LEFT AXILLARY LYMPH NODE DISSECTION;  Surgeon: [name], MD;  Location: [facility];  Service: General;  Laterality: Left;  . CESAREAN SECTION    . COLON SURGERY    . OOPHORECTOMY    . PORT-A-CATH REMOVAL Right 08/28/2018   Procedure: REMOVAL PORT-A-CATH;  Surgeon: [name], MD;  Location: [facility];  Service: General;  Laterality: Right;  . PORTACATH PLACEMENT N/A 09/06/2017   Procedure: INSERTION PORT-A-CATH;  Surgeon: [name], MD;  Location: [facility];  Service: General;  Laterality: N/A;  . SMALL INTESTINE SURGERY    . SPINE SURGERY      Family History  Problem Relation Age of Onset  . Mental illness Mother   .  Alcoholism Mother   . Cirrhosis Mother   . Cancer Father   . Lung cancer Father     Social History   Socioeconomic History  . Marital status: Single    Spouse name: Not on file  . Number of children: 2  . Years of education: Not on file  . Highest education level: Not on file  Occupational History  . Not on file  Social Needs  . Financial resource strain: Not on file  . Food insecurity    Worry: Not on file    Inability: Not on file  . Transportation needs    Medical: Not on file    Non-medical: Not on file  Tobacco Use  . Smoking status: Never Smoker  . Smokeless tobacco: Never Used  Substance and Sexual Activity  . Alcohol use: Not Currently    Alcohol/week: 0.0 standard drinks    Frequency: Never    Comment: occasional  . Drug use: Yes    Types: Marijuana    Comment: everyday  . Sexual activity: Not Currently    Birth control/protection: Post-menopausal, Surgical    Comment: Hysterectomy  Lifestyle  . Physical activity    Days per week: Not on file    Minutes per session: Not on file  . Stress: Not on file  Relationships  . Social connections  Talks on phone: Not on file    Gets together: Not on file    Attends religious service: Not on file    Active member of club or organization: Not on file    Attends meetings of clubs or organizations: Not on file    Relationship status: Not on file  . Intimate partner violence    Fear of current or ex partner: Not on file    Emotionally abused: Not on file    Physically abused: Not on file    Forced sexual activity: Not on file  Other Topics Concern  . Not on file  Social History Narrative  . Not on file    Outpatient Medications Prior to Visit  Medication Sig Dispense Refill  . acyclovir ointment (ZOVIRAX) 5 % Apply 1 application topically every 4 (four) hours. 15 g 2  . Multiple Vitamin (MULTIVITAMIN WITH MINERALS) TABS tablet Take 1 tablet by mouth daily after lunch.    . anastrozole (ARIMIDEX) 1 MG  tablet Take 1 tablet (1 mg total) by mouth daily. (Patient not taking: Reported on 09/05/2018) 90 tablet 4  . Artificial Tear Solution (OPTI-TEARS OP) Place 1 drop into both eyes 3 (three) times daily as needed (for dry/irritated eyes.).    Marland Kitchen fluticasone (FLONASE) 50 MCG/ACT nasal spray Place 1 spray into both nostrils daily as needed for allergies.    . Misc Natural Products (OSTEO BI-FLEX ADV TRIPLE ST PO) Take 1 tablet by mouth daily after lunch.    . naproxen sodium (ALEVE) 220 MG tablet Take 220 mg by mouth 2 (two) times daily as needed (FOR PAIN.).     Marland Kitchen tetrahydrozoline (VISINE) 0.05 % ophthalmic solution Place 1 drop into both eyes 3 (three) times daily as needed (for dry eyes.).     No facility-administered medications prior to visit.     Allergies  Allergen Reactions  . Penicillins Nausea Only    Has patient had a PCN reaction causing immediate rash, facial/tongue/throat swelling, SOB or lightheadedness with hypotension: No Has patient had a PCN reaction causing severe rash involving mucus membranes or skin necrosis: No Has patient had a PCN reaction that required hospitalization: No Has patient had a PCN reaction occurring within the last 10 years: Yes--nausea & headache ONLY If all of the above answers are "NO", then may proceed with Cephalosporin use.     ROS Review of Systems    Objective:     Vitals:   09/05/18 1156  BP: 136/84  Pulse: 83  Resp: 17  Temp: 98.7 F (37.1 C)  TempSrc: Oral  SpO2: 100%  Weight: 205 lb 6.4 oz (93.2 kg)  Height: 5\' 8"  (1.727 m)    Physical Exam Physical Exam  Constitutional: Oriented to person, place, and time. Appears well-developed and well-nourished.  HENT:  Head: Normocephalic and atraumatic.  Eyes: Conjunctivae and EOM are normal.  Cardiovascular: Normal rate, regular rhythm, normal heart sounds and intact distal pulses.  No murmur heard. Pulmonary/Chest: Effort normal and breath sounds normal. No stridor. No respiratory  distress. Has no wheezes.  Neurological: Is alert and oriented to person, place, and time.  Skin: Skin is warm. Capillary refill takes less than 2 seconds.  Psychiatric: Has a normal mood and affect. Behavior is normal. Judgment and thought content normal.   tinnels and phalens negative Abduction and adduction normal tenar and hypothenar normal bulk without atrophy or tenderness Allen test normal Cap refill <2s Warm  Normal range of motion No crepitus of the carpal  bones No effusion  Normal lumbrical exam    Health Maintenance Due  Topic Date Due  . TETANUS/TDAP  06/28/1974  . COLONOSCOPY  06/27/2005    There are no preventive care reminders to display for this patient.  No results found for: TSH Lab Results  Component Value Date   WBC 6.2 04/26/2018   HGB 10.8 (L) 04/26/2018   HCT 34.3 (L) 04/26/2018   MCV 85.5 04/26/2018   PLT 280 04/26/2018   Lab Results  Component Value Date   NA 141 04/26/2018   K 3.4 (L) 04/26/2018   CO2 25 04/26/2018   GLUCOSE 97 04/26/2018   BUN 9 04/26/2018   CREATININE 0.82 04/26/2018   BILITOT 0.5 04/26/2018   ALKPHOS 121 04/26/2018   AST 17 04/26/2018   ALT 17 04/26/2018   PROT 7.6 04/26/2018   ALBUMIN 4.1 04/26/2018   CALCIUM 9.9 04/26/2018   ANIONGAP 9 04/26/2018      EXAM: RIGHT HAND - COMPLETE 3+ VIEW  COMPARISON:  None.  FINDINGS: No acute fracture or dislocation. No bony erosions or periostitis. Moderate osteoarthritis of the fifth DIP joint. Mild osteoarthritis of the fourth DIP joint. Osteopenia. Soft tissues are unremarkable.  IMPRESSION: 1.  No acute osseous abnormality. 2. Moderate osteoarthritis of the fifth DIP joint.   Electronically Signed   By: Titus Dubin M.D.   On: 09/05/2018 14:14  Problem List Items Addressed This Visit    None    Visit Diagnoses    Right hand pain    -  Primary No acute osseous abnormality Due to OA will refer to Hand Surgery   Relevant Orders   DG Hand Complete  Right (Completed)   Ambulatory referral to Hand Surgery       No orders of the defined types were placed in this encounter.   Follow-up: No follow-ups on file.    Forrest Moron, MD

## 2018-09-05 NOTE — Patient Instructions (Addendum)
     If you have lab work done today you will be contacted with your lab results within the next 2 weeks.  If you have not heard from [place] then please contact us. The fastest way to get your results is to register for My Chart.   IF you received an x-ray today, you will receive an invoice from [facility]. Please contact [facility] at [phone] with questions or concerns regarding your invoice.   IF you received labwork today, you will receive an invoice from [facility]. Please contact [facility] at [phone] with questions or concerns regarding your invoice.   Our billing staff will not be able to assist you with questions regarding bills from these companies.  You will be contacted with the lab results as soon as they are available. The fastest way to get your results is to activate your My Chart account. Instructions are located on the last page of this paperwork. If you have not heard from [place] regarding the results in 2 weeks, please contact this office.     Hand Pain Many things can cause hand pain. Some common causes are:  An injury.  Repeating the same movement with your hand over and over (overuse).  Osteoporosis.  Arthritis.  Lumps in the tendons or joints of the hand and wrist (ganglion cysts).  Nerve compression syndromes (carpal tunnel syndrome).  Inflammation of the tendons (tendinitis).  Infection. Follow these instructions at home: Pay attention to any changes in your symptoms. Take these actions to help with your discomfort: Managing pain, stiffness, and swelling   Take over-the-counter and prescription medicines only as told by your health care provider.  Wear a hand splint or support as told by your health care provider.  If directed, put ice on the affected area: ? Put ice in a plastic bag. ? Place a towel between your skin and the bag. ? Leave the ice on for 20 minutes, 2-3 times a day. Activity  Take breaks from repetitive  activity often.  Avoid activities that make your pain worse.  Minimize stress on your hands and wrists as much as possible.  Do stretches or exercises as told by your health care provider.  Do not do activities that make your pain worse. Contact a health care provider if:  Your pain does not get better after a few days of self-care.  Your pain gets worse.  Your pain affects your ability to do your daily activities. Get help right away if:  Your hand becomes warm, red, or swollen.  Your hand is numb or tingling.  Your hand is extremely swollen or deformed.  Your hand or fingers turn white or blue.  You cannot move your hand, wrist, or fingers. Summary  Many things can cause hand pain.  Contact your health care provider if your pain does not get better after a few days of self care.  Minimize stress on your hands and wrists as much as possible.  Do not do activities that make your pain worse. This information is not intended to replace advice given to you by your health care provider. Make sure you discuss any questions you have with your health care provider. Document Released: 03/13/2015 Document Revised: 11/10/2017 Document Reviewed: 11/10/2017 Elsevier Patient Education  2020 [employer].

## 2018-09-12 ENCOUNTER — Other Ambulatory Visit: Payer: Self-pay

## 2018-09-12 ENCOUNTER — Inpatient Hospital Stay: Payer: 59 | Attending: Oncology | Admitting: Oncology

## 2018-09-12 ENCOUNTER — Inpatient Hospital Stay: Payer: 59

## 2018-09-12 VITALS — BP 123/70 | HR 68 | Resp 18 | Ht 68.0 in | Wt 203.6 lb

## 2018-09-12 DIAGNOSIS — C50412 Malignant neoplasm of upper-outer quadrant of left female breast: Secondary | ICD-10-CM

## 2018-09-12 DIAGNOSIS — Z170 Estrogen receptor positive status [ER+]: Secondary | ICD-10-CM | POA: Diagnosis not present

## 2018-09-12 DIAGNOSIS — C773 Secondary and unspecified malignant neoplasm of axilla and upper limb lymph nodes: Secondary | ICD-10-CM | POA: Insufficient documentation

## 2018-09-12 DIAGNOSIS — Z79811 Long term (current) use of aromatase inhibitors: Secondary | ICD-10-CM

## 2018-09-12 LAB — CBC WITH DIFFERENTIAL/PLATELET
Abs Immature Granulocytes: 0.02 10*3/uL (ref 0.00–0.07)
Basophils Absolute: 0 10*3/uL (ref 0.0–0.1)
Basophils Relative: 0 %
Eosinophils Absolute: 0.1 10*3/uL (ref 0.0–0.5)
Eosinophils Relative: 2 %
HCT: 38.1 % (ref 36.0–46.0)
Hemoglobin: 11.9 g/dL — ABNORMAL LOW (ref 12.0–15.0)
Immature Granulocytes: 0 %
Lymphocytes Relative: 29 %
Lymphs Abs: 2.3 10*3/uL (ref 0.7–4.0)
MCH: 26.3 pg (ref 26.0–34.0)
MCHC: 31.2 g/dL (ref 30.0–36.0)
MCV: 84.1 fL (ref 80.0–100.0)
Monocytes Absolute: 0.6 10*3/uL (ref 0.1–1.0)
Monocytes Relative: 8 %
Neutro Abs: 5 10*3/uL (ref 1.7–7.7)
Neutrophils Relative %: 61 %
Platelets: 315 10*3/uL (ref 150–400)
RBC: 4.53 MIL/uL (ref 3.87–5.11)
RDW: 14.2 % (ref 11.5–15.5)
WBC: 8 10*3/uL (ref 4.0–10.5)
nRBC: 0 % (ref 0.0–0.2)

## 2018-09-12 LAB — COMPREHENSIVE METABOLIC PANEL
ALT: 24 U/L (ref 0–44)
AST: 18 U/L (ref 15–41)
Albumin: 4.3 g/dL (ref 3.5–5.0)
Alkaline Phosphatase: 114 U/L (ref 38–126)
Anion gap: 10 (ref 5–15)
BUN: 12 mg/dL (ref 8–23)
CO2: 24 mmol/L (ref 22–32)
Calcium: 10.1 mg/dL (ref 8.9–10.3)
Chloride: 107 mmol/L (ref 98–111)
Creatinine, Ser: 0.83 mg/dL (ref 0.44–1.00)
GFR calc Af Amer: >60 mL/min (ref >60)
GFR calc non Af Amer: >60 mL/min (ref >60)
Glucose, Bld: 94 mg/dL (ref 70–99)
Potassium: 3.8 mmol/L (ref 3.5–5.1)
Sodium: 141 mmol/L (ref 135–145)
Total Bilirubin: 0.6 mg/dL (ref 0.3–1.2)
Total Protein: 7.8 g/dL (ref 6.5–8.1)

## 2018-09-12 NOTE — Progress Notes (Signed)
[place]  [phone] [phone]    ID: [name] DOB: [birth year]  MR#: [id number]  IRC#:[id number]  Patient Care Team: Forrest Moron, MD as PCP - General (Internal Medicine) Gayathri Futrell, [name] Dad, MD as Consulting Physician (Oncology) Kyung Rudd, MD as Consulting Physician (Radiation Oncology) [name], [name] All, MD as Consulting Physician (Obstetrics and Gynecology) Gentry Fitz, MD as Consulting Physician (Family Medicine) Bensimhon, [name], MD as Consulting Physician (Cardiology) [name], MD as Consulting Physician (General Surgery) OTHER MD:   CHIEF COMPLAINT: HER-2 positive breast cancer  CURRENT TREATMENT: Anastrozole   INTERVAL HISTORY: Taliyah returns today for follow-up and treatment of her HER-2 positive, weakly estrogen receptor positive breast cancer.   Since her last visit, she underwent repeat echocardiogram on 08/02/2018. This showed an ejection fraction of 60-65%, which is improved since 03/07/2018.  She had her port removed on 08/28/2018.  She was started on anastrozole April 2020. She reports she takes it in the morning, around 10:40-11 am. She reports fatigue, some days more so than others. She also reports hot flashes that don't bother her.  Her most recent bone density test was on 10/24/2016, which showed a T-score of -0.7.   REVIEW OF SYSTEMS: [name] reports she continues to work full time, and she is working in the office. She states she has tingling in her right hand. So she went to see her PCP, underwent right hand x-ray, and was found to have arthritis. A detailed review of systems was otherwise entirely negative.    HISTORY OF CURRENT ILLNESS: From the original intake note:  Madison Hopkins noted a mass in the left axilla sometime in January or February 2019.  She eventually brought her to her gynecologist's attention, and underwent bilateral diagnostic mammography with tomography and left  breast ultrasonography at [facility] on 08/04/2017 showing: breast density category B. There is a highly suspicious hypoechoic mass in the left breast at the 2 o'clock upper outer quadrant measuring 2.3 x 1.6 x 2.2 cm, located 2 cm from the nipple. Sonographically, there were 2 enlarged lymph nodes in the left axilla, the largest with cortical thickening measuring 2.5 cm. No evidence of malignancy was seen in the right breast.   Accordingly on 08/07/2017 she proceeded to biopsy of the left breast area and 1 of the lymph nodes in question. The pathology from this procedure showed ([id number]): Invasive ductal carcinoma, grade 3. Metastatic carcinoma was found in one left axillary lymph node. Prognostic indicators significant for: estrogen receptor, 30% positive with weak staining intensity and progesterone receptor, 0% negative. Proliferation marker Ki67 at 80%. HER2 amplified with ratios HER2/CEP17 signals 2.32 and average HER2 copies per cell 6.60  The patient's subsequent history is as detailed below.   PAST MEDICAL HISTORY: Past Medical History:  Diagnosis Date   Abnormal Pap smear of vagina 07/28/2016   LGSIL; colpo 07/2016 atrophic squamous cells; colpo 07/2017 - atypia   Allergy    Breast cancer ([place])    Metastatic Left breast   Elevated hemoglobin A1c 07/28/2016   level - 6.1   Endometriosis    Low vitamin D level 07/28/2016   level 24.7  GERD but no ulcers   PAST SURGICAL HISTORY: Past Surgical History:  Procedure Laterality Date   ABDOMINAL HYSTERECTOMY     BREAST LUMPECTOMY WITH RADIOACTIVE SEED AND AXILLARY LYMPH NODE DISSECTION Left 01/22/2018   Procedure: LEFT BREAST LUMPECTOMY WITH RADIOACTIVE SEED AND COMPLETE LEFT AXILLARY LYMPH NODE DISSECTION;  Surgeon: [name], MD;  Location: [place];  Service: General;  Laterality: Left;   CESAREAN SECTION     COLON SURGERY     OOPHORECTOMY     PORT-A-CATH REMOVAL Right 08/28/2018    Procedure: REMOVAL PORT-A-CATH;  Surgeon: [name], MD;  Location: [place];  Service: General;  Laterality: Right;   PORTACATH PLACEMENT N/A 09/06/2017   Procedure: INSERTION PORT-A-CATH;  Surgeon: [name], MD;  Location: [place];  Service: General;  Laterality: N/A;   SMALL INTESTINE SURGERY     SPINE SURGERY    Hysterectomy with salpingo-oophorectomy, Tonsillectomy, Plantar Fascitis Right Foot Surgery    FAMILY HISTORY: Family History  Problem Relation Age of Onset   Mental illness Mother    Alcoholism Mother    Cirrhosis Mother    Cancer Father    Lung cancer Father    The patient' father died at age 34 due to lung cancer (heavy smoker). The patient's mother died due to liver cirrhosis (heavy drinker). The patient has 2 brothers and no sisters. There was a paternal 1st cousin with colon cancer diagnosed in the mid 40's, who also had cervical cancer. The mother of this 1st cousin (the patient's paternal aunt) had cancer (the patient's is unsure of what type). There was also a paternal uncle with prostate cancer diagnosed in the 38's. The patient denies a family history of breast or ovarian cancer.     GYNECOLOGIC HISTORY:  No LMP recorded (lmp unknown). Patient has had a hysterectomy. Menarche: 63 years old Age at first live birth: 63 years old She is GXP2.  The patient is status post total hysterectomy with bilateral salpingo-oophorectomy in 1995.  She never used contraception. She notes that she had an estrogen shot one time but no other HRTs.    SOCIAL HISTORY:  The patient works in Therapist, art for the tax department. She is single. At home is herself and no pets. Her son, Madison Hopkins is age 6 and lives in [place], [place] as a Musician. The patient's daughter Madison Hopkins is age 31 and lives in [place] in Therapist, art for [employer]. The patient has 5 grandchildren and 1 great grandchild. She attends [employer].   ADVANCED DIRECTIVES:  Not in place; at the 08/16/2017 visit the patient was given the appropriate forms to complete on notarized at her discretion   HEALTH MAINTENANCE: Social History   Tobacco Use   Smoking status: Never Smoker   Smokeless tobacco: Never Used  Substance Use Topics   Alcohol use: Not Currently    Alcohol/week: 0.0 standard drinks    Frequency: Never    Comment: occasional   Drug use: Yes    Types: Marijuana    Comment: everyday     Colonoscopy: 2009?  PAP: 07/31/2017/ positive for Atypical Squamous cells of uncertain significance.   Bone density: 10/24/2016 showed a T score of -0.7 (normal was (   Allergies  Allergen Reactions   Penicillins Nausea Only    Has patient had a PCN reaction causing immediate rash, facial/tongue/throat swelling, SOB or lightheadedness with hypotension: No Has patient had a PCN reaction causing severe rash involving mucus membranes or skin necrosis: No Has patient had a PCN reaction that required hospitalization: No Has patient had a PCN reaction occurring within the last 10 years: Yes--nausea & headache ONLY If all of the above answers are "NO", then may proceed with Cephalosporin use.     Current Outpatient Medications  Medication  Sig Dispense Refill   acyclovir ointment (ZOVIRAX) 5 % Apply 1 application topically every 4 (four) hours. 15 g 2   anastrozole (ARIMIDEX) 1 MG tablet Take 1 tablet (1 mg total) by mouth daily. (Patient not taking: Reported on 09/05/2018) 90 tablet 4   Artificial Tear Solution (OPTI-TEARS OP) Place 1 drop into both eyes 3 (three) times daily as needed (for dry/irritated eyes.).     fluticasone (FLONASE) 50 MCG/ACT nasal spray Place 1 spray into both nostrils daily as needed for allergies.     meloxicam (MOBIC) 7.5 MG tablet Take 1 tablet (7.5 mg total) by mouth daily. 30 tablet 0   Misc Natural Products (OSTEO BI-FLEX ADV TRIPLE ST PO) Take 1 tablet by mouth daily after lunch.     Multiple Vitamin (MULTIVITAMIN  WITH MINERALS) TABS tablet Take 1 tablet by mouth daily after lunch.     naproxen sodium (ALEVE) 220 MG tablet Take 220 mg by mouth 2 (two) times daily as needed (FOR PAIN.).      tetrahydrozoline (VISINE) 0.05 % ophthalmic solution Place 1 drop into both eyes 3 (three) times daily as needed (for dry eyes.).     No current facility-administered medications for this visit.     OBJECTIVE: Middle-aged African-American woman in no acute distress  Vitals:   09/12/18 1415  BP: 123/70  Pulse: 68  Resp: 18  SpO2: 99%     Body mass index is 30.96 kg/m.   Wt Readings from Last 3 Encounters:  09/12/18 203 lb 9.6 oz (92.4 kg)  09/05/18 205 lb 6.4 oz (93.2 kg)  08/28/18 202 lb 9.6 oz (91.9 kg)  ECOG FS:1 - Symptomatic but completely ambulatory  Sclerae unicteric, EOMs intact Wearing a mask No cervical or supraclavicular adenopathy Lungs no rales or rhonchi Heart regular rate and rhythm Abd soft, nontender, positive bowel sounds MSK no focal spinal tenderness, no upper extremity lymphedema Neuro: nonfocal, well oriented, appropriate affect Breasts: The right breast is benign.  The left breast is status post lumpectomy and radiation.  The cosmetic result is excellent.  There is still some hyperpigmentation.  There is no evidence of disease recurrence.  Both axillae are benign.   LAB RESULTS:  CMP     Component Value Date/Time   NA 141 09/12/2018 1328   K 3.8 09/12/2018 1328   CL 107 09/12/2018 1328   CO2 24 09/12/2018 1328   GLUCOSE 94 09/12/2018 1328   BUN 12 09/12/2018 1328   CREATININE 0.83 09/12/2018 1328   CREATININE 0.81 10/25/2017 1055   CALCIUM 10.1 09/12/2018 1328   PROT 7.8 09/12/2018 1328   ALBUMIN 4.3 09/12/2018 1328   AST 18 09/12/2018 1328   AST 23 10/25/2017 1055   ALT 24 09/12/2018 1328   ALT 30 10/25/2017 1055   ALKPHOS 114 09/12/2018 1328   BILITOT 0.6 09/12/2018 1328   BILITOT 0.6 10/25/2017 1055   GFRNONAA >60 09/12/2018 1328   GFRNONAA >60 10/25/2017  1055   GFRAA >60 09/12/2018 1328   GFRAA >60 10/25/2017 1055    No results found for: TOTALPROTELP, ALBUMINELP, A1GS, A2GS, BETS, BETA2SER, GAMS, MSPIKE, SPEI  No results found for: KPAFRELGTCHN, LAMBDASER, KAPLAMBRATIO  Lab Results  Component Value Date   WBC 8.0 09/12/2018   NEUTROABS 5.0 09/12/2018   HGB 11.9 (L) 09/12/2018   HCT 38.1 09/12/2018   MCV 84.1 09/12/2018   PLT 315 09/12/2018    '@LASTCHEMISTRY'$ @  No results found for: LABCA2  No components found for: QQVZDG387  No  results for input(s): INR in the last 168 hours.  No results found for: LABCA2  No results found for: PZW258  No results found for: NID782  No results found for: UMP536  No results found for: CA2729  No components found for: HGQUANT  No results found for: CEA1 / No results found for: CEA1   No results found for: AFPTUMOR  No results found for: CHROMOGRNA  No results found for: PSA1  Appointment on 09/12/2018  Component Date Value Ref Range Status   Sodium 09/12/2018 141  135 - 145 mmol/L Final   Potassium 09/12/2018 3.8  3.5 - 5.1 mmol/L Final   Chloride 09/12/2018 107  98 - 111 mmol/L Final   CO2 09/12/2018 24  22 - 32 mmol/L Final   Glucose, Bld 09/12/2018 94  70 - 99 mg/dL Final   BUN 09/12/2018 12  8 - 23 mg/dL Final   Creatinine, Ser 09/12/2018 0.83  0.44 - 1.00 mg/dL Final   Calcium 09/12/2018 10.1  8.9 - 10.3 mg/dL Final   Total Protein 09/12/2018 7.8  6.5 - 8.1 g/dL Final   Albumin 09/12/2018 4.3  3.5 - 5.0 g/dL Final   AST 09/12/2018 18  15 - 41 U/L Final   ALT 09/12/2018 24  0 - 44 U/L Final   Alkaline Phosphatase 09/12/2018 114  38 - 126 U/L Final   Total Bilirubin 09/12/2018 0.6  0.3 - 1.2 mg/dL Final   GFR calc non Af Amer 09/12/2018 >60  >60 mL/min Final   GFR calc Af Amer 09/12/2018 >60  >60 mL/min Final   Anion gap 09/12/2018 10  5 - 15 Final   Performed at [facility], [place] [address] [place], [place] [address]   WBC  09/12/2018 8.0  4.0 - 10.5 K/uL Final   RBC 09/12/2018 4.53  3.87 - 5.11 MIL/uL Final   Hemoglobin 09/12/2018 11.9* 12.0 - 15.0 g/dL Final   HCT 09/12/2018 38.1  36.0 - 46.0 % Final   MCV 09/12/2018 84.1  80.0 - 100.0 fL Final   MCH 09/12/2018 26.3  26.0 - 34.0 pg Final   MCHC 09/12/2018 31.2  30.0 - 36.0 g/dL Final   RDW 09/12/2018 14.2  11.5 - 15.5 % Final   Platelets 09/12/2018 315  150 - 400 K/uL Final   nRBC 09/12/2018 0.0  0.0 - 0.2 % Final   Neutrophils Relative % 09/12/2018 61  % Final   Neutro Abs 09/12/2018 5.0  1.7 - 7.7 K/uL Final   Lymphocytes Relative 09/12/2018 29  % Final   Lymphs Abs 09/12/2018 2.3  0.7 - 4.0 K/uL Final   Monocytes Relative 09/12/2018 8  % Final   Monocytes Absolute 09/12/2018 0.6  0.1 - 1.0 K/uL Final   Eosinophils Relative 09/12/2018 2  % Final   Eosinophils Absolute 09/12/2018 0.1  0.0 - 0.5 K/uL Final   Basophils Relative 09/12/2018 0  % Final   Basophils Absolute 09/12/2018 0.0  0.0 - 0.1 K/uL Final   Immature Granulocytes 09/12/2018 0  % Final   Abs Immature Granulocytes 09/12/2018 0.02  0.00 - 0.07 K/uL Final   Performed at [facility], [place] [address] [place], [place] [address]    (this displays the last labs from the last 3 days)  No results found for: TOTALPROTELP, ALBUMINELP, A1GS, A2GS, BETS, BETA2SER, GAMS, MSPIKE, SPEI (this displays SPEP labs)  No results found for: KPAFRELGTCHN, LAMBDASER, KAPLAMBRATIO (kappa/lambda light chains)  No results found for: HGBA,  HGBA2QUANT, HGBFQUANT, HGBSQUAN (Hemoglobinopathy evaluation)   No results found for: LDH  No results found for: IRON, TIBC, IRONPCTSAT (Iron and TIBC)  No results found for: FERRITIN  Urinalysis    Component Value Date/Time   BILIRUBINUR negative 10/07/2017 0939   BILIRUBINUR n 08/25/2016 1407   KETONESUR negative 10/07/2017 0939   PROTEINUR =30 (A) 10/07/2017 0939   PROTEINUR n 08/25/2016 1407   UROBILINOGEN 1.0  10/07/2017 0939   NITRITE Positive (A) 10/07/2017 0939   NITRITE n 08/25/2016 1407   LEUKOCYTESUR Negative 10/07/2017 0939     STUDIES: Dg Hand Complete Right  Result Date: 09/05/2018 CLINICAL DATA:  Right hand pain and numbness. EXAM: RIGHT HAND - COMPLETE 3+ VIEW COMPARISON:  None. FINDINGS: No acute fracture or dislocation. No bony erosions or periostitis. Moderate osteoarthritis of the fifth DIP joint. Mild osteoarthritis of the fourth DIP joint. Osteopenia. Soft tissues are unremarkable. IMPRESSION: 1.  No acute osseous abnormality. 2. Moderate osteoarthritis of the fifth DIP joint. Electronically Signed   By: Titus Dubin M.D.   On: 09/05/2018 14:14     ELIGIBLE FOR AVAILABLE RESEARCH PROTOCOL: No   ASSESSMENT: 63 y.o. [place], [place] woman status post left breast upper outer quadrant and left axillary lymph node biopsy 08/07/2017, both positive for a T2 N1, stage IIB invasive ductal carcinoma, grade 3, estrogen receptor weakly positive, progesterone receptor negative, but HER-2 amplified, with an MIB-1 of 80%  (a) staging chest CT and bone scan 08/30/2017 showed no evidence of metastatic disease  (1) neoadjuvant chemotherapy will consist of carboplatin, docetaxel, trastuzumab, and Pertuzumab given every 21 days x 6 starting 09/07/2017, perjeta omitted with cycle 2 and 3 due to diarrhea  (a) Gemcitabine substituted for Docetaxel beginning with cycle 5 and 6 for concerns of neuropathy  (2) trastuzumab continued to complete 6 months, last dose 04/06/2018  (a) echocardiogram 08/28/2017 showed an ejection fraction in the 60-65% range.  (b) echocardiogram on 11/29/2017 showed an EF of 55-60%  (c) echocardiogram 03/07/2018 showed an ejection fraction in the 55-60% range  (3) Left lumpectomy on 01/22/2018 shows a ypT1b pN1a, grade 3 residual invasive ductal carcinoma, agnostic panel HER-2 negative, ER 40% weakly positive, PR negative.   (a) foundation one testing requested on  02/02/2018  (b) total of 11 axillary lymph nodes removed (1+)  (4) adjuvant radiation completed 04/20/2018  (5) starting anastrozole 05/30/2018   PLAN: Madison Hopkins is now a little over half a year out from definitive surgery for her breast cancer.  She has recovered well from her surgery and radiation and she is tolerating anastrozole generally with mild hot flashes as the main side effect.  I reassured her that anastrozole does not cause weight gain and that she should concentrate on the "whites", namely past rice potatoes and bread, and eat a lot of vegetables instead of those  She was encouraged to participate in regular exercise as well.  The goal is 45 minutes 5 times a week.  The plan is to continue anastrozole for a total of 5 years.  She will have her next mammogram in October.  She will see Dr. Dalbert Batman in November.  She will return to see me in March of next year  She knows to call for any other issues that may develop before the next visit.   Nevan Creighton, [name] Dad, MD  09/12/18 2:47 PM Medical Oncology and Hematology [facility] [address] [place], [place] [address] Tel. [phone]    Fax. [phone]  I, Wilburn Mylar, am acting as scribe for Dr. Sarajane Jews C. Danne Vasek.  I, Lurline Del MD, have reviewed the above documentation for accuracy and completeness, and I agree with the above.

## 2018-09-29 ENCOUNTER — Other Ambulatory Visit: Payer: Self-pay | Admitting: Family Medicine

## 2018-09-29 NOTE — Telephone Encounter (Signed)
Forwarding medication refill request to clinical pool for review. 

## 2018-10-02 ENCOUNTER — Encounter: Payer: Self-pay | Admitting: *Deleted

## 2018-10-09 ENCOUNTER — Telehealth: Payer: Self-pay | Admitting: Adult Health

## 2018-10-09 NOTE — Telephone Encounter (Signed)
Scheduled appt per 8/04 sch message- mailed letter with appt date and time

## 2018-10-12 ENCOUNTER — Ambulatory Visit: Payer: 59 | Admitting: Family Medicine

## 2018-10-19 ENCOUNTER — Other Ambulatory Visit: Payer: Self-pay

## 2018-10-23 ENCOUNTER — Ambulatory Visit: Payer: 59 | Admitting: Obstetrics and Gynecology

## 2018-10-23 ENCOUNTER — Encounter: Payer: Self-pay | Admitting: Obstetrics and Gynecology

## 2018-10-23 ENCOUNTER — Other Ambulatory Visit (HOSPITAL_COMMUNITY)
Admission: RE | Admit: 2018-10-23 | Discharge: 2018-10-23 | Disposition: A | Discharge status: Home / Self Care | Payer: 59 | Source: Ambulatory Visit | Attending: Obstetrics and Gynecology | Admitting: Obstetrics and Gynecology

## 2018-10-23 ENCOUNTER — Other Ambulatory Visit: Payer: Self-pay

## 2018-10-23 VITALS — BP 142/70 | HR 84 | Temp 97.2°F | Resp 14 | Ht 67.75 in | Wt 202.8 lb

## 2018-10-23 DIAGNOSIS — Z23 Encounter for immunization: Secondary | ICD-10-CM

## 2018-10-23 DIAGNOSIS — N760 Acute vaginitis: Secondary | ICD-10-CM | POA: Diagnosis not present

## 2018-10-23 DIAGNOSIS — Z01419 Encounter for gynecological examination (general) (routine) without abnormal findings: Secondary | ICD-10-CM | POA: Insufficient documentation

## 2018-10-23 NOTE — Progress Notes (Signed)
63 y.o. G2P0002 Single African American female here for annual exam.    Has breast cancer.  Finished radiation in Feb.  Had port a cath just removed.  Taking Arimidex.  Has some vaginal irritation.  Uses "V" soap from [facility]. Uses Diflucan and boric acid periodically.   Getting closer to retirement.  Works for [employer]   PCP:  Zoe A. Nolon Rod, MD   No LMP recorded (lmp unknown). Patient has had a hysterectomy.           Sexually active: No.  The current method of family planning is status post hysterectomy.    Exercising: Yes.    walking Smoker:  no  Health Maintenance: Pap: 08/17/17 colpo showing mild atypia of the vagina 07/31/17 ASCUS:Neg HR HPV.negative for BV, Candida, and trichomonas. 7/13/18colpo biopsy showing atrophic squamous cells (menopausal change); 07/28/16 LGSIL History of abnormal Pap:  yes MMG:  12/27/17 MR Breast Bilateral - BIRADS 6: Known biopsy-proven malignancy; 01/19/18 Seed placed left breast Colonoscopy:  years ago per patient BMD:   08/29/16  Result  Normal TDaP:  Not up to date HIV and Hep C: 08/15/16 Negative Screening Labs: discuss if needed   reports that she has never smoked. She has never used smokeless tobacco. She reports previous alcohol use. She reports current drug use. Drug: Marijuana.  Past Medical History:  Diagnosis Date  . Abnormal Pap smear of vagina 07/28/2016   LGSIL; colpo 07/2016 atrophic squamous cells; colpo 07/2017 - atypia  . Allergy   . Breast cancer ([place])    Metastatic Left breast  . Elevated hemoglobin A1c 07/28/2016   level - 6.1  . Endometriosis   . HSV-2 infection   . Low vitamin D level 07/28/2016   level 24.7    Past Surgical History:  Procedure Laterality Date  . ABDOMINAL HYSTERECTOMY    . BREAST LUMPECTOMY WITH RADIOACTIVE SEED AND AXILLARY LYMPH NODE DISSECTION Left 01/22/2018   Procedure: LEFT BREAST LUMPECTOMY WITH RADIOACTIVE SEED AND COMPLETE LEFT AXILLARY LYMPH NODE DISSECTION;  Surgeon:  [name], MD;  Location: [place];  Service: General;  Laterality: Left;  . CESAREAN SECTION    . COLON SURGERY    . OOPHORECTOMY    . PORT-A-CATH REMOVAL Right 08/28/2018   Procedure: REMOVAL PORT-A-CATH;  Surgeon: [name], MD;  Location: [place];  Service: General;  Laterality: Right;  . PORTACATH PLACEMENT N/A 09/06/2017   Procedure: INSERTION PORT-A-CATH;  Surgeon: [name], MD;  Location: [place];  Service: General;  Laterality: N/A;  . SMALL INTESTINE SURGERY    . SPINE SURGERY      Current Outpatient Medications  Medication Sig Dispense Refill  . acyclovir ointment (ZOVIRAX) 5 % Apply 1 application topically every 4 (four) hours. 15 g 2  . anastrozole (ARIMIDEX) 1 MG tablet Take 1 tablet (1 mg total) by mouth daily. 90 tablet 4  . Artificial Tear Solution (OPTI-TEARS OP) Place 1 drop into both eyes 3 (three) times daily as needed (for dry/irritated eyes.).    Marland Kitchen Ascorbic Acid (VITAMIN C) 100 MG CHEW Chew by mouth.    . BORIC ACID EX Apply topically.    . fluconazole (DIFLUCAN) 150 MG tablet Take 150 mg by mouth daily.    . fluticasone (FLONASE) 50 MCG/ACT nasal spray Place 1 spray into both nostrils daily as needed for allergies.    . Misc Natural Products (OSTEO BI-FLEX ADV TRIPLE ST PO) Take 1 tablet by mouth daily after lunch.    Marland Kitchen  Multiple Vitamin (MULTIVITAMIN WITH MINERALS) TABS tablet Take 1 tablet by mouth daily after lunch.    . Multiple Vitamins-Minerals (VITAMIN D3 COMPLETE PO) Take by mouth.    . naproxen sodium (ALEVE) 220 MG tablet Take 220 mg by mouth 2 (two) times daily as needed (FOR PAIN.).     Marland Kitchen Probiotic Product (ALIGN PO) Take by mouth.    . tetrahydrozoline (VISINE) 0.05 % ophthalmic solution Place 1 drop into both eyes 3 (three) times daily as needed (for dry eyes.).    Marland Kitchen TURMERIC PO Take by mouth daily.     No current facility-administered medications for this visit.     Family History  Problem Relation  Age of Onset  . Mental illness Mother   . Alcoholism Mother   . Cirrhosis Mother   . Cancer Father   . Lung cancer Father     Review of Systems  Constitutional: Negative.   HENT: Negative.   Eyes: Negative.   Respiratory: Negative.   Cardiovascular: Negative.   Gastrointestinal: Negative.   Endocrine: Negative.   Genitourinary: Negative.   Musculoskeletal: Negative.   Skin: Negative.   Allergic/Immunologic: Negative.   Neurological: Negative.   Hematological: Negative.   Psychiatric/Behavioral: Negative.     Exam:   BP (!) 142/70 (BP Location: Right Arm, Patient Position: Sitting, Cuff Size: Large)   Pulse 84   Temp (!) 97.2 F (36.2 C) (Temporal)   Resp 14   Ht 5' 7.75" (1.721 m)   Wt 202 lb 12.8 oz (92 kg)   LMP  (LMP Unknown)   BMI 31.06 kg/m     General appearance: alert, cooperative and appears stated age Head: normocephalic, without obvious abnormality, atraumatic Neck: no adenopathy, supple, symmetrical, trachea midline and thyroid normal to inspection and palpation Lungs: clear to auscultation bilaterally Breasts: right - normal appearance, no masses or tenderness, No nipple retraction or dimpling, No nipple discharge or bleeding, No axillary adenopathy Left - Tender scar noted and axillary scar.  no masses.  No nipple retraction or dimpling, No nipple discharge or bleeding, No axillary adenopathy Heart: regular rate and rhythm Abdomen: soft, non-tender; no masses, no organomegaly Extremities: extremities normal, atraumatic, no cyanosis or edema Skin: skin color, texture, turgor normal. No rashes or lesions Lymph nodes: cervical, supraclavicular, and axillary nodes normal. Neurologic: grossly normal  Pelvic: External genitalia:  no lesions              No abnormal inguinal nodes palpated.              Urethra:  normal appearing urethra with no masses, tenderness or lesions              Bartholins and Skenes: normal                 Vagina: normal appearing  vagina with normal color and discharge, no lesions              Cervix:  absent              Pap taken: Yes.   Bimanual Exam:  Uterus:  absent              Adnexa: no mass, fullness, tenderness              Rectal exam: Yes.  .  Confirms.              Anus:  normal sphincter tone, no lesions  Chaperone was present for exam.  Assessment:  Well woman visit with normal exam. Status post TAH/BSO/appy.  Hx VAIN I. Hx metastatic left breast cancer.  Hx HSV II. Elevated A1C.  Low vit D.   Plan: Mammogram dx and breast MRI this fall. She will see Dr. Dalbert Batman in November.  Self breast awareness reviewed. Pap and HR HPV as above. Guidelines for Calcium, Vitamin D, regular exercise program including cardiovascular and weight bearing exercise. Declines Valtrex.  We discussed vaginitis.  Dove soap recommended and stop washing in vagina.  Affirm.  Labs with PCP.  TDap.  Follow up annually and prn.  After visit summary provided.

## 2018-10-23 NOTE — Patient Instructions (Signed)
EXERCISE AND DIET:  We recommended that you start or continue a regular exercise program for good health. Regular exercise means any activity that makes your heart beat faster and makes you sweat.  We recommend exercising at least 30 minutes per day at least 3 days a week, preferably 4 or 5.  We also recommend a diet low in fat and sugar.  Inactivity, poor dietary choices and obesity can cause diabetes, heart attack, stroke, and kidney damage, among others.   ° °ALCOHOL AND SMOKING:  Women should limit their alcohol intake to no more than 7 drinks/beers/glasses of wine (combined, not each!) per week. Moderation of alcohol intake to this level decreases your risk of breast cancer and liver damage. And of course, no recreational drugs are part of a healthy lifestyle.  And absolutely no smoking or even second hand smoke. Most people know smoking can cause heart and lung diseases, but did you know it also contributes to weakening of your bones? Aging of your skin?  Yellowing of your teeth and nails? ° °CALCIUM AND VITAMIN D:  Adequate intake of calcium and Vitamin D are recommended.  The recommendations for exact amounts of these supplements seem to change often, but generally speaking 600 mg of calcium (either carbonate or citrate) and 800 units of Vitamin D per day seems prudent. Certain women may benefit from higher intake of Vitamin D.  If you are among these women, your doctor will have told you during your visit.   ° °PAP SMEARS:  Pap smears, to check for cervical cancer or precancers,  have traditionally been done yearly, although recent scientific advances have shown that most women can have pap smears less often.  However, every woman still should have a physical exam from her gynecologist every year. It will include a breast check, inspection of the vulva and vagina to check for abnormal growths or skin changes, a visual exam of the cervix, and then an exam to evaluate the size and shape of the uterus and  ovaries.  And after 63 years of age, a rectal exam is indicated to check for rectal cancers. We will also provide age appropriate advice regarding health maintenance, like when you should have certain vaccines, screening for sexually transmitted diseases, bone density testing, colonoscopy, mammograms, etc.  ° °MAMMOGRAMS:  All women over 40 years old should have a yearly mammogram. Many facilities now offer a "3D" mammogram, which may cost around $50 extra out of pocket. If possible,  we recommend you accept the option to have the 3D mammogram performed.  It both reduces the number of women who will be called back for extra views which then turn out to be normal, and it is better than the routine mammogram at detecting truly abnormal areas.   ° °COLONOSCOPY:  Colonoscopy to screen for colon cancer is recommended for all women at age 50.  We know, you hate the idea of the prep.  We agree, BUT, having colon cancer and not knowing it is worse!!  Colon cancer so often starts as a polyp that can be seen and removed at colonscopy, which can quite literally save your life!  And if your first colonoscopy is normal and you have no family history of colon cancer, most women don't have to have it again for 10 years.  Once every ten years, you can do something that may end up saving your life, right?  We will be happy to help you get it scheduled when you are ready.    Be sure to check your insurance coverage so you understand how much it will cost.  It may be covered as a preventative service at no cost, but you should check your particular policy.      Vaginitis Vaginitis is a condition in which the vaginal tissue swells and becomes red (inflamed). This condition is most often caused by a change in the normal balance of bacteria and yeast that live in the vagina. This change causes an overgrowth of certain bacteria or yeast, which causes the inflammation. There are different types of vaginitis, but the most common types  are:  Bacterial vaginosis.  Yeast infection (candidiasis).  Trichomoniasis vaginitis. This is a sexually transmitted disease (STD).  Viral vaginitis.  Atrophic vaginitis.  Allergic vaginitis. What are the causes? The cause of this condition depends on the type of vaginitis. It can be caused by:  Bacteria (bacterial vaginosis).  Yeast, which is a fungus (yeast infection).  A parasite (trichomoniasis vaginitis).  A virus (viral vaginitis).  Low hormone levels (atrophic vaginitis). Low hormone levels can occur during pregnancy, breastfeeding, or after menopause.  Irritants, such as bubble baths, scented tampons, and feminine sprays (allergic vaginitis). Other factors can change the normal balance of the yeast and bacteria that live in the vagina. These include:  Antibiotic medicines.  Poor hygiene.  Diaphragms, vaginal sponges, spermicides, birth control pills, and intrauterine devices (IUD).  Sex.  Infection.  Uncontrolled diabetes.  A weakened defense (immune) system. What increases the risk? This condition is more likely to develop in women who:  Smoke.  Use vaginal douches, scented tampons, or scented sanitary pads.  Wear tight-fitting pants.  Wear thong underwear.  Use oral birth control pills or an IUD.  Have sex without a condom.  Have multiple sex partners.  Have an STD.  Frequently use the spermicide nonoxynol-9.  Eat lots of foods high in sugar.  Have uncontrolled diabetes.  Have low estrogen levels.  Have a weakened immune system from an immune disorder or medical treatment.  Are pregnant or breastfeeding. What are the signs or symptoms? Symptoms vary depending on the cause of the vaginitis. Common symptoms include:  Abnormal vaginal discharge. ? The discharge is white, gray, or yellow with bacterial vaginosis. ? The discharge is thick, white, and cheesy with a yeast infection. ? The discharge is frothy and yellow or greenish with  trichomoniasis.  A bad vaginal smell. The smell is fishy with bacterial vaginosis.  Vaginal itching, pain, or swelling.  Sex that is painful.  Pain or burning when urinating. Sometimes there are no symptoms. How is this diagnosed? This condition is diagnosed based on your symptoms and medical history. A physical exam, including a pelvic exam, will also be done. You may also have other tests, including:  Tests to determine the pH level (acidity or alkalinity) of your vagina.  A whiff test, to assess the odor that results when a sample of your vaginal discharge is mixed with a potassium hydroxide solution.  Tests of vaginal fluid. A sample will be examined under a microscope. How is this treated? Treatment varies depending on the type of vaginitis you have. Your treatment may include:  Antibiotic creams or pills to treat bacterial vaginosis and trichomoniasis.  Antifungal medicines, such as vaginal creams or suppositories, to treat a yeast infection.  Medicine to ease discomfort if you have viral vaginitis. Your sexual partner should also be treated.  Estrogen delivered in a cream, pill, suppository, or vaginal ring to treat atrophic vaginitis. If  vaginal dryness occurs, lubricants and moisturizing creams may help. You may need to avoid scented soaps, sprays, or douches.  Stopping use of a product that is causing allergic vaginitis. Then using a vaginal cream to treat the symptoms. Follow these instructions at home: Lifestyle  Keep your genital area clean and dry. Avoid soap, and only rinse the area with water.  Do not douche or use tampons until your health care provider says it is okay to do so. Use sanitary pads, if needed.  Do not have sex until your health care provider approves. When you can return to sex, practice safe sex and use condoms.  Wipe from front to back. This avoids the spread of bacteria from the rectum to the vagina. General instructions  Take  over-the-counter and prescription medicines only as told by your health care provider.  If you were prescribed an antibiotic medicine, take or use it as told by your health care provider. Do not stop taking or using the antibiotic even if you start to feel better.  Keep all follow-up visits as told by your health care provider. This is important. How is this prevented?  Use mild, non-scented products. Do not use things that can irritate the vagina, such as fabric softeners. Avoid the following products if they are scented: ? Feminine sprays. ? Detergents. ? Tampons. ? Feminine hygiene products. ? Soaps or bubble baths.  Let air reach your genital area. ? Wear cotton underwear to reduce moisture buildup. ? Avoid wearing underwear while you sleep. ? Avoid wearing tight pants and underwear or nylons without a cotton panel. ? Avoid wearing thong underwear.  Take off any wet clothing, such as bathing suits, as soon as possible.  Practice safe sex and use condoms. Contact a health care provider if:  You have abdominal pain.  You have a fever.  You have symptoms that last for more than 2-3 days. Get help right away if:  You have a fever and your symptoms suddenly get worse. Summary  Vaginitis is a condition in which the vaginal tissue becomes inflamed.This condition is most often caused by a change in the normal balance of bacteria and yeast that live in the vagina.  Treatment varies depending on the type of vaginitis you have.  Do not douche, use tampons , or have sex until your health care provider approves. When you can return to sex, practice safe sex and use condoms. This information is not intended to replace advice given to you by your health care provider. Make sure you discuss any questions you have with your health care provider. Document Released: 12/12/2006 Document Revised: 01/27/2017 Document Reviewed: 03/22/2016 Elsevier Patient Education  2020 [employer].

## 2018-10-24 LAB — VAGINITIS/VAGINOSIS, DNA PROBE
Candida Species: NEGATIVE
Gardnerella vaginalis: NEGATIVE
Trichomonas vaginosis: NEGATIVE

## 2018-10-25 LAB — CYTOLOGY - PAP
Diagnosis: NEGATIVE
HPV: NOT DETECTED

## 2018-11-02 ENCOUNTER — Other Ambulatory Visit: Payer: Self-pay | Admitting: Oncology

## 2018-11-02 ENCOUNTER — Ambulatory Visit
Admission: RE | Admit: 2018-11-02 | Discharge: 2018-11-02 | Disposition: A | Discharge status: Home / Self Care | Payer: 59 | Source: Ambulatory Visit | Attending: Oncology | Admitting: Oncology

## 2018-11-02 ENCOUNTER — Other Ambulatory Visit: Payer: Self-pay

## 2018-11-02 DIAGNOSIS — Z170 Estrogen receptor positive status [ER+]: Secondary | ICD-10-CM

## 2018-11-02 DIAGNOSIS — N6310 Unspecified lump in the right breast, unspecified quadrant: Secondary | ICD-10-CM

## 2018-11-02 DIAGNOSIS — C50412 Malignant neoplasm of upper-outer quadrant of left female breast: Secondary | ICD-10-CM

## 2018-11-02 HISTORY — DX: Personal history of antineoplastic chemotherapy: Z92.21

## 2018-11-02 HISTORY — DX: Personal history of irradiation: Z92.3

## 2018-12-18 ENCOUNTER — Telehealth: Payer: Self-pay | Admitting: Adult Health

## 2018-12-18 NOTE — Telephone Encounter (Signed)
Madison Hopkins 11/13 moved appointment to 12/3. Confirmed with patient.   Per patient she spoke with someone from [facility] office last week re her appointment with Dr. Dalbert Batman and needing an MRI. I offered to transfer patient to desk nurse however, patient refused stating she is a little discouraged because she never gets a call back and asks that I just give the message that she hasn't heard anything further re the mri after that discussion. Patient aware I would send this message to [facility].

## 2018-12-18 NOTE — Telephone Encounter (Signed)
Kathlee Nations, will you call patient and evaluate what she is talking about?  Thanks, Vivian

## 2018-12-19 NOTE — Telephone Encounter (Signed)
RN placed call to follow up with concerns regarding scheduling/MRI.   Left message for patient to return call.

## 2019-01-11 ENCOUNTER — Encounter: Payer: 59 | Admitting: Adult Health

## 2019-01-22 ENCOUNTER — Telehealth: Payer: Self-pay | Admitting: Adult Health

## 2019-01-22 NOTE — Telephone Encounter (Signed)
I talk with patient regarding phone visit  °

## 2019-01-23 ENCOUNTER — Telehealth: Payer: Self-pay

## 2019-01-23 NOTE — Telephone Encounter (Signed)
Spoke with pt by phone.  She called to cancel her upcoming appt on 12/3 stating she will call back to reschedule.

## 2019-01-28 ENCOUNTER — Telehealth: Payer: Self-pay | Admitting: Family Medicine

## 2019-01-28 NOTE — Telephone Encounter (Signed)
Patient is requesting a refill of the following medications: Requested Prescriptions    No prescriptions requested or ordered in this encounter    Date of patient request: 01/28/19 Last office visit:09/05/18 Date of last refill: historical medication Last refill amount: n/a Follow up time period per chart: n/a

## 2019-01-28 NOTE — Telephone Encounter (Signed)
Medication Refill - Medication: BORIC ACID EX   Has the patient contacted their pharmacy? Yes.   (Agent: If no, request that the patient contact the pharmacy for the refill.) (Agent: If yes, when and what did the pharmacy advise?)  Preferred Pharmacy (with phone number or street name):  [facility] [place] [address]  [address] [place] [address]  Phone: [phone] Fax: [phone]  Not a 24 hour pharmacy; exact hours not known.     Agent: Please be advised that RX refills may take up to 3 business days. We ask that you follow-up with your pharmacy.

## 2019-01-31 ENCOUNTER — Telehealth: Payer: Self-pay | Admitting: Obstetrics and Gynecology

## 2019-01-31 ENCOUNTER — Telehealth: Payer: 59 | Admitting: Adult Health

## 2019-01-31 NOTE — Telephone Encounter (Signed)
Spoke with patient. Patient reports vaginal irritation that started on 01/27/19. Patient states she just feels uncomfortable down there, can't explain it. Reports urinary frequency, voids small amounts, drink a lot of fluids. Denies vaginal odor, itching, discharge, bleeding, dysuria, blood in urine, flank pain, fever/chills, N/V. Used OTC 1 day monistat on 12/2, provided no relief. Has used boric acid vaginal suppositories in the past for similar symptoms.   Advised OV recommended for further evaluation, patient request to be seen before the weekend, advised Dr. Quincy Simmonds is out of the office on 12/4, patient agreeable to schedule with covering provider. [id number] prescreen negative, precautions reviewed. OV scheduled for 12/4 at [place] with Melvia Heaps, CNM.   Routing to provider for final review. Patient is agreeable to disposition. Will close encounter.  Cc: Melvia Heaps, CNM

## 2019-01-31 NOTE — Telephone Encounter (Signed)
Patient is calling regarding vaginal irritation.

## 2019-02-01 ENCOUNTER — Other Ambulatory Visit: Payer: Self-pay

## 2019-02-01 ENCOUNTER — Ambulatory Visit: Payer: 59 | Admitting: Certified Nurse Midwife

## 2019-02-01 ENCOUNTER — Encounter: Payer: Self-pay | Admitting: Certified Nurse Midwife

## 2019-02-01 VITALS — BP 120/70 | HR 68 | Temp 97.2°F | Resp 16 | Wt 204.0 lb

## 2019-02-01 DIAGNOSIS — N952 Postmenopausal atrophic vaginitis: Secondary | ICD-10-CM

## 2019-02-01 DIAGNOSIS — R350 Frequency of micturition: Secondary | ICD-10-CM

## 2019-02-01 LAB — POCT URINALYSIS DIPSTICK
Bilirubin, UA: NEGATIVE
Blood, UA: NEGATIVE
Glucose, UA: NEGATIVE
Ketones, UA: NEGATIVE
Leukocytes, UA: NEGATIVE
Nitrite, UA: NEGATIVE
Protein, UA: NEGATIVE
Urobilinogen, UA: NEGATIVE E.U./dL — AB
pH, UA: 5 (ref 5.0–8.0)

## 2019-02-01 NOTE — Progress Notes (Signed)
63 y.o. Single African American female [id number] here with complaint of vaginal symptoms of itching, burning.  Marland Kitchen Describes discharge as none that she is aware of except for the residue of Monistat one day OTC. She admits to some vaginal dryness, tried Olive oil and coconut oil occasional. She has been using Boric acid capsules and [place] products.  Onset of symptoms 5 days ago. Denies new personal products. No STD concerns. Urinary symptoms urinary frequency,but drinks water frequently. Denies fever,chills or backache or odorous urine . Menopausal and history of hysterectomy. Patient is also on Arimidex due to history of breast cancer. 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 No other health issues today.  Review of Systems  Constitutional: Negative.   HENT: Negative.   Eyes: Positive for redness.  Respiratory: Negative.   Cardiovascular: Negative.   Gastrointestinal: Negative.   Genitourinary: Positive for frequency.  Musculoskeletal: Negative.   Skin: Negative.        Vaginal irritation  Neurological: Negative.   Endo/Heme/Allergies: Negative.   Psychiatric/Behavioral: Negative.     O:Healthy female WDWN Affect: normal, orientation x 3  Exam:Skin: warm and dry Abdomen:soft, non tender, negative suprapubic tenderness  Inguinal Lymph nodes: no enlargement or tenderness Pelvic exam: External genital: normal female appearance BUS: negative, Bladder non tender, urethral meatus, no redness, non tender Vagina: scant white non odorous (appears to be medication residue)noted.  Affirm taken Cervix: absemt Uterus: absent Adnexa: non tender, no masses or fullness noted Rectal area:  No lesions normal appearance  POCT urine negative  A:Normal pelvic exam R/O UTI  Atrophic vaginitis with medication overuse suspected R/O vaginal infection  P:Discussed findings of urine negative and feel symptoms are vaginal in origin and etiology. Discussed Aveeno or baking soda sitz bath for comfort  for itching.. Avoid moist clothes or pads for extended period of time. Coconut Oil use for skin protection prior and nightly for moisture. Recommended Dove soap for bathing and stop using Boric acid capsules which can be drying and irritate the Urethral meatus. Reviewed warning signs of UTI, but feel urine touching skin is the causing the irritation.  Questions addressed. Lab: Urine culture Affirm   Rv prn

## 2019-02-01 NOTE — Patient Instructions (Signed)
Atrophic Vaginitis Atrophic vaginitis is a condition in which the tissues that line the vagina become dry and thin. This condition occurs in women who have stopped having their period. It is caused by a drop in a female hormone (estrogen). This hormone helps:  To keep the vagina moist.  To make a clear fluid. This clear fluid helps: ? To make the vagina ready for sex. ? To protect the vagina from infection. If the lining of the vagina is dry and thin, it may cause irritation, burning, or itchiness. It may also:  Make sex painful.  Make an exam of your vagina painful.  Cause bleeding.  Make you lose interest in sex.  Cause a burning feeling when you pee (urinate).  Cause a brown or yellow fluid to come from your vagina. Some women do not have symptoms. Follow these instructions at home: Medicines  Take over-the-counter and prescription medicines only as told by your doctor.  Do not use herbs or other medicines unless your doctor says it is okay.  Use medicines for for dryness. These include: ? Oils to make the vagina soft. ? Creams. ? Moisturizers. General instructions  Do not douche.  Do not use products that can make your vagina dry. These include: ? Scented sprays. ? Scented tampons. ? Scented soaps.  Sex can help increase blood flow and soften the tissue in the vagina. If it hurts to have sex: ? Tell your partner. ? Use products to make sex more comfortable. Use these only as told by your doctor. Contact a doctor if you:  Have discharge from the vagina that is different than usual.  Have a bad smell coming from your vagina.  Have new symptoms.  Do not get better.  Get worse. Summary  Atrophic vaginitis is a condition in which the lining of the vagina becomes dry and thin.  This condition affects women who have stopped having their periods.  Treatment may include using products that help make the vagina soft.  Call a doctor if do not get better with  treatment. This information is not intended to replace advice given to you by your health care provider. Make sure you discuss any questions you have with your health care provider. Document Released: 08/03/2007 Document Revised: 02/27/2017 Document Reviewed: 02/27/2017 Elsevier Patient Education  2020 [employer]  

## 2019-02-02 LAB — VAGINITIS/VAGINOSIS, DNA PROBE
Candida Species: NEGATIVE
Gardnerella vaginalis: NEGATIVE
Trichomonas vaginosis: NEGATIVE

## 2019-02-03 LAB — URINE CULTURE: Organism ID, Bacteria: NO GROWTH

## 2019-02-05 ENCOUNTER — Other Ambulatory Visit: Payer: Self-pay

## 2019-02-05 DIAGNOSIS — Z20822 Contact with and (suspected) exposure to covid-19: Secondary | ICD-10-CM

## 2019-02-06 ENCOUNTER — Telehealth: Payer: Self-pay | Admitting: Family Medicine

## 2019-02-06 ENCOUNTER — Telehealth: Payer: Self-pay

## 2019-02-06 NOTE — Telephone Encounter (Signed)
Pt called to let us know that that she was tested for Covid yesterday and has a temp of 104 .what should she do take  was advised if liver or kidney problems to take tylenol and if goes higher go to ER

## 2019-02-06 NOTE — Telephone Encounter (Signed)
Pt called to inform that she was tested for COVID yesterday due to symptoms of fever, congestion, and decreased in appetite.    RN encouraged use of acetaminophen, encouraged fluids.  RN reviewed CDC guidelines, pt voiced understanding.  Educated of signs of symptoms of COVID that need emergency intervention.  No further needs at this time.

## 2019-02-07 LAB — NOVEL CORONAVIRUS, NAA: SARS-CoV-2, NAA: DETECTED — AB

## 2019-02-08 ENCOUNTER — Telehealth: Payer: Self-pay | Admitting: Certified Nurse Midwife

## 2019-02-08 NOTE — Telephone Encounter (Signed)
Patient called to report that she was seen in our office last Friday 02/01/19 and has since tested positive for covid-19. She said she did not have any symptoms until the Tuesday after her appointment. She was tested on Tuesday 02/05/19. No need to return her call unless you have questions.

## 2019-02-08 NOTE — Telephone Encounter (Signed)
Pt called oncology office and Francesco Runner, RN encouraged use of tylenol, reviewed CDC guidelines/educated on COVID signs and symptoms and symptoms that require emergent intervention.

## 2019-02-08 NOTE — Telephone Encounter (Signed)
[facility] notified.    Routing to provider FYI.   Encounter closed.

## 2019-02-09 ENCOUNTER — Telehealth: Payer: Self-pay | Admitting: Unknown Physician Specialty

## 2019-02-09 NOTE — Telephone Encounter (Signed)
  I connected by phone with Madison Hopkins on 02/09/2019 at 12:01 PM to discuss the potential use of an new treatment for mild to moderate COVID-19 viral infection in non-hospitalized patients.  Pt is not eligible due to lack of comorbidity.

## 2019-02-20 ENCOUNTER — Other Ambulatory Visit: Payer: Self-pay

## 2019-02-20 ENCOUNTER — Telehealth: Payer: Self-pay | Admitting: Family Medicine

## 2019-02-20 ENCOUNTER — Ambulatory Visit (INDEPENDENT_AMBULATORY_CARE_PROVIDER_SITE_OTHER): Payer: 59

## 2019-02-20 ENCOUNTER — Ambulatory Visit: Payer: 59 | Admitting: Emergency Medicine

## 2019-02-20 ENCOUNTER — Encounter: Payer: Self-pay | Admitting: Emergency Medicine

## 2019-02-20 VITALS — BP 125/77 | HR 77 | Temp 98.7°F | Resp 16 | Ht 67.5 in | Wt 199.0 lb

## 2019-02-20 DIAGNOSIS — H9202 Otalgia, left ear: Secondary | ICD-10-CM | POA: Diagnosis not present

## 2019-02-20 DIAGNOSIS — H60502 Unspecified acute noninfective otitis externa, left ear: Secondary | ICD-10-CM

## 2019-02-20 DIAGNOSIS — R0989 Other specified symptoms and signs involving the circulatory and respiratory systems: Secondary | ICD-10-CM

## 2019-02-20 DIAGNOSIS — U071 COVID-19: Secondary | ICD-10-CM

## 2019-02-20 MED ORDER — CIPROFLOXACIN-DEXAMETHASONE 0.3-0.1 % OT SUSP: 4.0000 [drp] | Freq: Two times a day (BID) | OTIC | 1 refills | Status: DC

## 2019-02-20 NOTE — Telephone Encounter (Signed)
Please advise 

## 2019-02-20 NOTE — Progress Notes (Signed)
Madison Hopkins 63 y.o.   Chief Complaint  Patient presents with  . Ear Pain    x 3 days LEFT slightly pain    HISTORY OF PRESENT ILLNESS: This is a 63 y.o. female complaining of pain to her left ear for 3 days. Status post Covid infection with mild chest congestion. Wants her lungs checked.  Denies difficulty breathing, fever or chills. No other complaints or medical concerns.  HPI   Prior to Admission medications   Medication Sig Start Date End Date Taking? Authorizing Provider  acyclovir ointment (ZOVIRAX) 5 % Apply 1 application topically every 4 (four) hours. 10/06/17  Yes Causey, Charlestine Massed, NP  anastrozole (ARIMIDEX) 1 MG tablet Take 1 tablet (1 mg total) by mouth daily. 05/28/18  Yes [name], [name] Dad, MD  Artificial Tear Solution (OPTI-TEARS OP) Place 1 drop into both eyes 3 (three) times daily as needed (for dry/irritated eyes.).   Yes [provider]  Ascorbic Acid (VITAMIN C) 100 MG CHEW Chew by mouth.   Yes [provider]  fluticasone (FLONASE) 50 MCG/ACT nasal spray Place 1 spray into both nostrils daily as needed for allergies.   Yes [provider]  Misc Natural Products (OSTEO BI-FLEX ADV TRIPLE ST PO) Take 1 tablet by mouth daily after lunch.   Yes [provider]  Multiple Vitamin (MULTIVITAMIN WITH MINERALS) TABS tablet Take 1 tablet by mouth daily after lunch.   Yes [provider]  Multiple Vitamins-Minerals (VITAMIN D3 COMPLETE PO) Take by mouth.   Yes [provider]  naproxen sodium (ALEVE) 220 MG tablet Take 220 mg by mouth 2 (two) times daily as needed (FOR PAIN.).    Yes [provider]  Probiotic Product (ALIGN PO) Take by mouth.   Yes [provider]  tetrahydrozoline (VISINE) 0.05 % ophthalmic solution Place 1 drop into both eyes 3 (three) times daily as needed (for dry eyes.).   Yes [provider]  TURMERIC PO Take by mouth daily.   Yes [provider]  BORIC  ACID EX Apply topically.    [provider]  fluconazole (DIFLUCAN) 100 MG tablet Take 100 mg by mouth daily.    [provider]  prochlorperazine (COMPAZINE) 10 MG tablet Take 1 tablet (10 mg total) by mouth every 6 (six) hours as needed (Nausea or vomiting). 11/09/17 02/01/18  Gardenia Phlegm, NP    Allergies  Allergen Reactions  . Bee Venom   . Penicillins Nausea Only    Has patient had a PCN reaction causing immediate rash, facial/tongue/throat swelling, SOB or lightheadedness with hypotension: No Has patient had a PCN reaction causing severe rash involving mucus membranes or skin necrosis: No Has patient had a PCN reaction that required hospitalization: No Has patient had a PCN reaction occurring within the last 10 years: Yes--nausea & headache ONLY If all of the above answers are "NO", then may proceed with Cephalosporin use.     Patient Active Problem List   Diagnosis Date Noted  . Environmental allergies 01/12/2018  . Malignant tumor of breast ([place]) 01/12/2018  . Port-A-Cath in place 11/09/2017  . Malignant neoplasm of upper-outer quadrant of left breast in female, estrogen receptor positive ([place]) 08/10/2017    Past Medical History:  Diagnosis Date  . Abnormal Pap smear of vagina 07/28/2016   LGSIL; colpo 07/2016 atrophic squamous cells; colpo 07/2017 - atypia  . Allergy   . Breast cancer ([place])    Metastatic Left breast  . Elevated hemoglobin A1c  07/28/2016   level - 6.1  . Endometriosis   . HSV-2 infection   . Low vitamin D level 07/28/2016   level 24.7  . Personal history of chemotherapy    finished 10/19  . Personal history of radiation therapy    finished 03/2018    Past Surgical History:  Procedure Laterality Date  . ABDOMINAL HYSTERECTOMY    . BREAST BIOPSY Left 08/09/2017  . BREAST LUMPECTOMY Left 01/22/2018  . BREAST LUMPECTOMY WITH RADIOACTIVE SEED AND AXILLARY LYMPH NODE DISSECTION Left 01/22/2018   Procedure: LEFT BREAST  LUMPECTOMY WITH RADIOACTIVE SEED AND COMPLETE LEFT AXILLARY LYMPH NODE DISSECTION;  Surgeon: [name], MD;  Location: [facility];  Service: General;  Laterality: Left;  . CESAREAN SECTION    . COLON SURGERY    . OOPHORECTOMY    . PORT-A-CATH REMOVAL Right 08/28/2018   Procedure: REMOVAL PORT-A-CATH;  Surgeon: [name], MD;  Location: [facility];  Service: General;  Laterality: Right;  . PORTACATH PLACEMENT N/A 09/06/2017   Procedure: INSERTION PORT-A-CATH;  Surgeon: [name], MD;  Location: [facility];  Service: General;  Laterality: N/A;  . SMALL INTESTINE SURGERY    . SPINE SURGERY      Social History   Socioeconomic History  . Marital status: Single    Spouse name: Not on file  . Number of children: 2  . Years of education: Not on file  . Highest education level: Not on file  Occupational History  . Not on file  Tobacco Use  . Smoking status: Never Smoker  . Smokeless tobacco: Never Used  Substance and Sexual Activity  . Alcohol use: Not Currently    Alcohol/week: 0.0 standard drinks  . Drug use: Yes    Types: Marijuana    Comment: everyday  . Sexual activity: Not Currently    Birth control/protection: Post-menopausal, Surgical    Comment: Hysterectomy  Other Topics Concern  . Not on file  Social History Narrative  . Not on file   Social Determinants of Health   Financial Resource Strain:   . Difficulty of Paying Living Expenses: Not on file  Food Insecurity:   . Worried About Charity fundraiser in the Last Year: Not on file  . Ran Out of Food in the Last Year: Not on file  Transportation Needs:   . Lack of Transportation (Medical): Not on file  . Lack of Transportation (Non-Medical): Not on file  Physical Activity:   . Days of Exercise per Week: Not on file  . Minutes of Exercise per Session: Not on file  Stress:   . Feeling of Stress : Not on file  Social Connections:   . Frequency of Communication with Friends and  Family: Not on file  . Frequency of Social Gatherings with Friends and Family: Not on file  . Attends Religious Services: Not on file  . Active Member of Clubs or Organizations: Not on file  . Attends Archivist Meetings: Not on file  . Marital Status: Not on file  Intimate Partner Violence:   . Fear of Current or Ex-Partner: Not on file  . Emotionally Abused: Not on file  . Physically Abused: Not on file  . Sexually Abused: Not on file    Family History  Problem Relation Age of Onset  . Mental illness Mother   . Alcoholism Mother   . Cirrhosis Mother   . Cancer Father   . Lung cancer Father  Review of Systems  Constitutional: Negative.  Negative for chills and fever.  HENT: Positive for ear pain. Negative for ear discharge and sore throat.   Respiratory: Negative.  Negative for cough and shortness of breath.        Chest congestion  Cardiovascular: Negative.  Negative for chest pain and palpitations.  Gastrointestinal: Negative.  Negative for abdominal pain, diarrhea, nausea and vomiting.  Genitourinary: Negative.  Negative for dysuria.  Skin: Negative.  Negative for rash.  Neurological: Negative.  Negative for dizziness and headaches.  All other systems reviewed and are negative.  Today's Vitals   02/20/19 1131  BP: 125/77  Pulse: 77  Resp: 16  Temp: 98.7 F (37.1 C)  TempSrc: Oral  SpO2: 96%  Weight: 199 lb (90.3 kg)  Height: 5' 7.5" (1.715 m)   Body mass index is 30.71 kg/m.   Physical Exam Vitals reviewed.  Constitutional:      Appearance: Normal appearance.  HENT:     Head: Normocephalic.     Left Ear: Tympanic membrane normal. Swelling and tenderness present.  Eyes:     Extraocular Movements: Extraocular movements intact.     Conjunctiva/sclera: Conjunctivae normal.     Pupils: Pupils are equal, round, and reactive to light.  Cardiovascular:     Rate and Rhythm: Normal rate and regular rhythm.     Pulses: Normal pulses.     Heart  sounds: Normal heart sounds.  Pulmonary:     Effort: Pulmonary effort is normal.     Breath sounds: Normal breath sounds.  Musculoskeletal:        General: Normal range of motion.     Cervical back: Normal range of motion and neck supple.  Lymphadenopathy:     Cervical: No cervical adenopathy.  Skin:    General: Skin is warm and dry.  Neurological:     General: No focal deficit present.     Mental Status: She is alert and oriented to person, place, and time.  Psychiatric:        Mood and Affect: Mood normal.        Behavior: Behavior normal.    DG Chest 2 View  Result Date: 02/20/2019 CLINICAL DATA:  Congestion EXAM: CHEST - 2 VIEW COMPARISON:  September 06, 2017 FINDINGS: There is atelectatic change in the left base. The lungs elsewhere are clear. Heart size and pulmonary vascularity are normal. No adenopathy. There are surgical clips in the left breast region. There is mild degenerative change in the thoracic spine. IMPRESSION: Left base atelectasis. Lungs elsewhere clear. Cardiac silhouette normal. No adenopathy. Postoperative change left breast region. Electronically Signed   By: Lowella Grip III M.D.   On: 02/20/2019 12:08     ASSESSMENT & PLAN: Sylwia was seen today for ear pain.  Diagnoses and all orders for this visit:  Acute otalgia, left  Chest congestion -     DG Chest 2 View; Future  Acute otitis externa of left ear, unspecified type -     ciprofloxacin-dexamethasone (CIPRODEX) OTIC suspension; Place 4 drops into the left ear 2 (two) times daily.  COVID-19 virus infection Comments: Recent and improving  Other orders -     MyChart COVID-19 home monitoring program; Future    Patient Instructions       If you have lab work done today you will be contacted with your lab results within the next 2 weeks.  If you have not heard from [place] then please contact us. The fastest way to  get your results is to register for My Chart.   IF you received an x-ray today,  you will receive an invoice from [facility]. Please contact [facility] at [phone] with questions or concerns regarding your invoice.   IF you received labwork today, you will receive an invoice from [facility]. Please contact [facility] at [phone] with questions or concerns regarding your invoice.   Our billing staff will not be able to assist you with questions regarding bills from these companies.  You will be contacted with the lab results as soon as they are available. The fastest way to get your results is to activate your My Chart account. Instructions are located on the last page of this paperwork. If you have not heard from [place] regarding the results in 2 weeks, please contact this office.     Otitis Externa  Otitis externa is an infection of the outer ear canal. The outer ear canal is the area between the outside of the ear and the eardrum. Otitis externa is sometimes called swimmer's ear. What are the causes? Common causes of this condition include:  Swimming in dirty water.  Moisture in the ear.  An injury to the inside of the ear.  An object stuck in the ear.  A cut or scrape on the outside of the ear. What increases the risk? You are more likely to get this condition if you go swimming often. What are the signs or symptoms?  Itching in the ear. This is often the first symptom.  Swelling of the ear.  Redness in the ear.  Ear pain. The pain may get worse when you pull on your ear.  Pus coming from the ear. How is this treated? This condition may be treated with:  Antibiotic ear drops. These are often given for 10-14 days.  Medicines to reduce itching and swelling. Follow these instructions at home:  If you were given antibiotic ear drops, use them as told by your doctor. Do not stop using them even if your condition gets better.  Take over-the-counter and prescription medicines only as told by your doctor.  Avoid getting water in  your ears as told by your doctor. You may be told to avoid swimming or water sports for a few days.  Keep all follow-up visits as told by your doctor. This is important. How is this prevented?  Keep your ears dry. Use the corner of a towel to dry your ears after you swim or bathe.  Try not to scratch or put things in your ear. Doing these things makes it easier for germs to grow in your ear.  Avoid swimming in lakes, dirty water, or pools that may not have the right amount of a chemical called chlorine. Contact a doctor if:  You have a fever.  Your ear is still red, swollen, or painful after 3 days.  You still have pus coming from your ear after 3 days.  Your redness, swelling, or pain gets worse.  You have a really bad headache.  You have redness, swelling, pain, or tenderness behind your ear. Summary  Otitis externa is an infection of the outer ear canal.  Symptoms include pain, redness, and swelling of the ear.  If you were given antibiotic ear drops, use them as told by your doctor. Do not stop using them even if your condition gets better.  Try not to scratch or put things in your ear. This information is not intended to replace advice given to you  by your health care provider. Make sure you discuss any questions you have with your health care provider. Document Released: 08/03/2007 Document Revised: 07/21/2017 Document Reviewed: 07/21/2017 Elsevier Patient Education  2020 Elsevier Inc.      Agustina Caroli, MD [facility]

## 2019-02-20 NOTE — Telephone Encounter (Signed)
Pt insurance does not cover her medication and she is requesting for something else to be prescribed   Please advise

## 2019-02-20 NOTE — Telephone Encounter (Signed)
Patient will give [place] a call if she have any problems getting meds and if ions will not cover she will call with the names of the ones they will cover

## 2019-02-20 NOTE — Patient Instructions (Addendum)
If you have lab work done today you will be contacted with your lab results within the next 2 weeks.  If you have not heard from [place] then please contact us. The fastest way to get your results is to register for My Chart.   IF you received an x-ray today, you will receive an invoice from [facility]. Please contact [facility] at [phone] with questions or concerns regarding your invoice.   IF you received labwork today, you will receive an invoice from [facility]. Please contact [facility] at [phone] with questions or concerns regarding your invoice.   Our billing staff will not be able to assist you with questions regarding bills from these companies.  You will be contacted with the lab results as soon as they are available. The fastest way to get your results is to activate your My Chart account. Instructions are located on the last page of this paperwork. If you have not heard from [place] regarding the results in 2 weeks, please contact this office.     Otitis Externa  Otitis externa is an infection of the outer ear canal. The outer ear canal is the area between the outside of the ear and the eardrum. Otitis externa is sometimes called swimmer's ear. What are the causes? Common causes of this condition include:  Swimming in dirty water.  Moisture in the ear.  An injury to the inside of the ear.  An object stuck in the ear.  A cut or scrape on the outside of the ear. What increases the risk? You are more likely to get this condition if you go swimming often. What are the signs or symptoms?  Itching in the ear. This is often the first symptom.  Swelling of the ear.  Redness in the ear.  Ear pain. The pain may get worse when you pull on your ear.  Pus coming from the ear. How is this treated? This condition may be treated with:  Antibiotic ear drops. These are often given for 10-14 days.  Medicines to reduce itching and swelling. Follow these  instructions at home:  If you were given antibiotic ear drops, use them as told by your doctor. Do not stop using them even if your condition gets better.  Take over-the-counter and prescription medicines only as told by your doctor.  Avoid getting water in your ears as told by your doctor. You may be told to avoid swimming or water sports for a few days.  Keep all follow-up visits as told by your doctor. This is important. How is this prevented?  Keep your ears dry. Use the corner of a towel to dry your ears after you swim or bathe.  Try not to scratch or put things in your ear. Doing these things makes it easier for germs to grow in your ear.  Avoid swimming in lakes, dirty water, or pools that may not have the right amount of a chemical called chlorine. Contact a doctor if:  You have a fever.  Your ear is still red, swollen, or painful after 3 days.  You still have pus coming from your ear after 3 days.  Your redness, swelling, or pain gets worse.  You have a really bad headache.  You have redness, swelling, pain, or tenderness behind your ear. Summary  Otitis externa is an infection of the outer ear canal.  Symptoms include pain, redness, and swelling of the ear.  If you were given antibiotic ear drops, use them  as told by your doctor. Do not stop using them even if your condition gets better.  Try not to scratch or put things in your ear. This information is not intended to replace advice given to you by your health care provider. Make sure you discuss any questions you have with your health care provider. Document Released: 08/03/2007 Document Revised: 07/21/2017 Document Reviewed: 07/21/2017 Elsevier Patient Education  2020 [employer].

## 2019-02-20 NOTE — Telephone Encounter (Signed)
Find out from insurance company what medication is covered.  Thanks.

## 2019-02-25 ENCOUNTER — Telehealth: Payer: Self-pay | Admitting: Family Medicine

## 2019-02-25 NOTE — Telephone Encounter (Signed)
Pt is suppose to go back to work tomorrow and has been feeling better but the drops for her ear has not done anything or given any relief. Pt asked if Dr. Mitchel Honour can call in an antibiotic today to help with relief for her left ear / please call and advise if medication can be sent to pharmacy

## 2019-02-25 NOTE — Telephone Encounter (Signed)
Please Advise

## 2019-02-26 ENCOUNTER — Encounter: Payer: Self-pay | Admitting: Emergency Medicine

## 2019-03-20 NOTE — Telephone Encounter (Signed)
No entry 

## 2019-04-11 ENCOUNTER — Other Ambulatory Visit: Payer: Self-pay | Admitting: Orthopedic Surgery

## 2019-04-11 DIAGNOSIS — M542 Cervicalgia: Secondary | ICD-10-CM

## 2019-05-04 ENCOUNTER — Other Ambulatory Visit: Payer: Self-pay

## 2019-05-04 ENCOUNTER — Ambulatory Visit
Admission: RE | Admit: 2019-05-04 | Discharge: 2019-05-04 | Disposition: A | Discharge status: Home / Self Care | Payer: 59 | Source: Ambulatory Visit | Attending: Orthopedic Surgery | Admitting: Orthopedic Surgery

## 2019-05-04 DIAGNOSIS — M542 Cervicalgia: Secondary | ICD-10-CM

## 2019-05-13 NOTE — Progress Notes (Signed)
[place]  Telephone:([phone]) [phone] [phone]    ID: Madison Hopkins DOB: April 11, 1955  MR#: [id number]  AXE#:[id number]  Patient Care Team: Forrest Moron, MD as PCP - General (Internal Medicine) Harneet Noblett, [name] Dad, MD as Consulting Physician (Oncology) Kyung Rudd, MD as Consulting Physician (Radiation Oncology) [name], [name] All, MD as Consulting Physician (Obstetrics and Gynecology) Gentry Fitz, MD as Consulting Physician (Family Medicine) Bensimhon, [name], MD as Consulting Physician (Cardiology) [name], MD as Consulting Physician (General Surgery) [name], MD as Attending Physician (Physical Medicine and Rehabilitation) OTHER MD:   CHIEF COMPLAINT: HER-2 positive breast cancer  CURRENT TREATMENT: Anastrozole   INTERVAL HISTORY: Labrea returns today for follow-up of her HER-2 positive, weakly estrogen receptor positive breast cancer.   She started anastrozole April 2020.  Hot flashes and vaginal dryness are not major issues for her.  Her most recent bone density test was on 10/24/2016, which showed a T-score of -0.7.  Since her last visit, she underwent bilateral diagnostic mammography with tomography at [facility] on 11/02/2018 showing: breast density category B; benign 1.1 cm right breast cyst; no evidence of malignancy in either breast.  She also underwent pap smear on 10/23/2018, which was negative.  She contracted Covid-19 and tested positive on 02/05/2019. Follow up chest x-ray was performed on 02/20/2019 and showed: left base atelectasis; lungs elsewhere clear.  She lost her sense of smell and taste for about 2 weeks but she has regained those.  She is hoping to get her vaccine later this month.  She tells me no one else in her family was infected that she knows of.  She began to experience neck, right arm, and right hand pain in 03/2019. She underwent cervical spine MRI on 05/04/2019 showing: degenerative cervical  spondylosis with multilevel disc disease and facet disease; enlarging left thyroid nodule.   REVIEW OF SYSTEMS: [name] is having persistent problems with feeling in her right arm and Dr.Wang is scheduled to give her another injection I believe in about a week.  These have worked for her in the past.  For exercise she is doing some walking but not as much as she would like.  She is concerned about the thyroid nodule noted on her neck MRI and that is discussed further below.   HISTORY OF CURRENT ILLNESS: From the original intake note:  Madison Hopkins noted a mass in the left axilla sometime in January or February 2019.  She eventually brought her to her gynecologist's attention, and underwent bilateral diagnostic mammography with tomography and left breast ultrasonography at [facility] on 08/04/2017 showing: breast density category B. There is a highly suspicious hypoechoic mass in the left breast at the 2 o'clock upper outer quadrant measuring 2.3 x 1.6 x 2.2 cm, located 2 cm from the nipple. Sonographically, there were 2 enlarged lymph nodes in the left axilla, the largest with cortical thickening measuring 2.5 cm. No evidence of malignancy was seen in the right breast.   Accordingly on 08/07/2017 she proceeded to biopsy of the left breast area and 1 of the lymph nodes in question. The pathology from this procedure showed ([id number]): Invasive ductal carcinoma, grade 3. Metastatic carcinoma was found in one left axillary lymph node. Prognostic indicators significant for: estrogen receptor, 30% positive with weak staining intensity and progesterone receptor, 0% negative. Proliferation marker Ki67 at 80%. HER2 amplified with ratios HER2/CEP17 signals 2.32 and average HER2 copies per cell 6.60  The patient's subsequent  history is as detailed below.   PAST MEDICAL HISTORY: Past Medical History:  Diagnosis Date  . Abnormal Pap smear of vagina 07/28/2016   LGSIL; colpo 07/2016 atrophic squamous  cells; colpo 07/2017 - atypia  . Allergy   . Breast cancer ([place])    Metastatic Left breast  . Elevated hemoglobin A1c 07/28/2016   level - 6.1  . Endometriosis   . HSV-2 infection   . Low vitamin D level 07/28/2016   level 24.7  . Personal history of chemotherapy    finished 10/19  . Personal history of radiation therapy    finished 03/2018  GERD but no ulcers   PAST SURGICAL HISTORY: Past Surgical History:  Procedure Laterality Date  . ABDOMINAL HYSTERECTOMY    . BREAST BIOPSY Left 08/09/2017  . BREAST LUMPECTOMY Left 01/22/2018  . BREAST LUMPECTOMY WITH RADIOACTIVE SEED AND AXILLARY LYMPH NODE DISSECTION Left 01/22/2018   Procedure: LEFT BREAST LUMPECTOMY WITH RADIOACTIVE SEED AND COMPLETE LEFT AXILLARY LYMPH NODE DISSECTION;  Surgeon: [name], MD;  Location: [facility];  Service: General;  Laterality: Left;  . CESAREAN SECTION    . COLON SURGERY    . OOPHORECTOMY    . PORT-A-CATH REMOVAL Right 08/28/2018   Procedure: REMOVAL PORT-A-CATH;  Surgeon: [name], MD;  Location: [facility];  Service: General;  Laterality: Right;  . PORTACATH PLACEMENT N/A 09/06/2017   Procedure: INSERTION PORT-A-CATH;  Surgeon: [name], MD;  Location: [facility];  Service: General;  Laterality: N/A;  . SMALL INTESTINE SURGERY    . SPINE SURGERY    Hysterectomy with salpingo-oophorectomy, Tonsillectomy, Plantar Fascitis Right Foot Surgery    FAMILY HISTORY: Family History  Problem Relation Age of Onset  . Mental illness Mother   . Alcoholism Mother   . Cirrhosis Mother   . Cancer Father   . Lung cancer Father    The patient' father died at age 68 due to lung cancer (heavy smoker). The patient's mother died due to liver cirrhosis (heavy drinker). The patient has 2 brothers and no sisters. There was a paternal 1st cousin with colon cancer diagnosed in the mid 40's, who also had cervical cancer. The mother of this 1st cousin (the patient's paternal aunt)  had cancer (the patient's is unsure of what type). There was also a paternal uncle with prostate cancer diagnosed in the 34's. The patient denies a family history of breast or ovarian cancer.     GYNECOLOGIC HISTORY:  No LMP recorded (lmp unknown). Patient has had a hysterectomy. Menarche: 64 years old Age at first live birth: 64 years old She is GXP2.  The patient is status post total hysterectomy with bilateral salpingo-oophorectomy in 1995.  She never used contraception. She notes that she had an estrogen shot one time but no other HRTs.    SOCIAL HISTORY:  The patient works in Therapist, art for the tax department. She is single. At home is herself and no pets. Her son, [name] is age 9 and lives in [place], [place] as a Musician. The patient's daughter [name] is age 55 and lives in [place] in Therapist, art for [employer]. The patient has 5 grandchildren and 1 great grandchild. She attends  [employer].   ADVANCED DIRECTIVES: Not in place; at the 08/16/2017 visit the patient was given the appropriate forms to complete on notarized at her discretion   HEALTH MAINTENANCE: Social History   Tobacco Use  . Smoking status: Never Smoker  . Smokeless tobacco:  Never Used  Substance Use Topics  . Alcohol use: Not Currently    Alcohol/week: 0.0 standard drinks  . Drug use: Yes    Types: Marijuana    Comment: everyday     Colonoscopy: 2009?  PAP: 09/2018, negative  Bone density: 10/24/2016, -0.7   Allergies  Allergen Reactions  . Bee Venom   . Penicillins Nausea Only    Has patient had a PCN reaction causing immediate rash, facial/tongue/throat swelling, SOB or lightheadedness with hypotension: No Has patient had a PCN reaction causing severe rash involving mucus membranes or skin necrosis: No Has patient had a PCN reaction that required hospitalization: No Has patient had a PCN reaction occurring within the last 10 years: Yes--nausea & headache ONLY If all of the  above answers are "NO", then may proceed with Cephalosporin use.     Current Outpatient Medications  Medication Sig Dispense Refill  . acyclovir ointment (ZOVIRAX) 5 % Apply 1 application topically every 4 (four) hours. 15 g 2  . anastrozole (ARIMIDEX) 1 MG tablet Take 1 tablet (1 mg total) by mouth daily. 90 tablet 4  . Artificial Tear Solution (OPTI-TEARS OP) Place 1 drop into both eyes 3 (three) times daily as needed (for dry/irritated eyes.).    Marland Kitchen Ascorbic Acid (VITAMIN C) 100 MG CHEW Chew by mouth.    . BORIC ACID EX Apply topically.    . ciprofloxacin-dexamethasone (CIPRODEX) OTIC suspension Place 4 drops into the left ear 2 (two) times daily. 7.5 mL 1  . fluconazole (DIFLUCAN) 100 MG tablet Take 100 mg by mouth daily.    . fluticasone (FLONASE) 50 MCG/ACT nasal spray Place 1 spray into both nostrils daily as needed for allergies.    . Misc Natural Products (OSTEO BI-FLEX ADV TRIPLE ST PO) Take 1 tablet by mouth daily after lunch.    . Multiple Vitamin (MULTIVITAMIN WITH MINERALS) TABS tablet Take 1 tablet by mouth daily after lunch.    . Multiple Vitamins-Minerals (VITAMIN D3 COMPLETE PO) Take by mouth.    . naproxen sodium (ALEVE) 220 MG tablet Take 220 mg by mouth 2 (two) times daily as needed (FOR PAIN.).     Marland Kitchen Probiotic Product (ALIGN PO) Take by mouth.    . tetrahydrozoline (VISINE) 0.05 % ophthalmic solution Place 1 drop into both eyes 3 (three) times daily as needed (for dry eyes.).    Marland Kitchen TURMERIC PO Take by mouth daily.     No current facility-administered medications for this visit.    OBJECTIVE: Middle-aged African-American woman who appears younger than stated age  47:   05/14/19 1453  BP: 137/73  Pulse: 73  Resp: 18  Temp: 97.8 F (36.6 C)  SpO2: 99%     Body mass index is 31.87 kg/m.   Wt Readings from Last 3 Encounters:  05/14/19 206 lb 8 oz (93.7 kg)  02/20/19 199 lb (90.3 kg)  02/01/19 204 lb (92.5 kg)  ECOG FS:1 - Symptomatic but completely  ambulatory  Sclerae unicteric, EOMs intact Wearing a mask No cervical or supraclavicular adenopathy Lungs no rales or rhonchi Heart regular rate and rhythm Abd soft, nontender, positive bowel sounds MSK no focal spinal tenderness, no upper extremity lymphedema Neuro: nonfocal, well oriented, appropriate affect Breasts: The right breast is unremarkable.  The left breast is status post lumpectomy and radiation.  There is no evidence of local recurrence.  The area where she has some concerns laterally in the breast is entirely benign.  Both axillae are benign.  LAB RESULTS:  CMP     Component Value Date/Time   NA 141 05/14/2019 1446   K 3.4 (L) 05/14/2019 1446   CL 106 05/14/2019 1446   CO2 26 05/14/2019 1446   GLUCOSE 95 05/14/2019 1446   BUN 10 05/14/2019 1446   CREATININE 0.86 05/14/2019 1446   CREATININE 0.81 10/25/2017 1055   CALCIUM 9.6 05/14/2019 1446   PROT 7.2 05/14/2019 1446   ALBUMIN 4.1 05/14/2019 1446   AST 16 05/14/2019 1446   AST 23 10/25/2017 1055   ALT 19 05/14/2019 1446   ALT 30 10/25/2017 1055   ALKPHOS 126 05/14/2019 1446   BILITOT 0.3 05/14/2019 1446   BILITOT 0.6 10/25/2017 1055   GFRNONAA >60 05/14/2019 1446   GFRNONAA >60 10/25/2017 1055   GFRAA >60 05/14/2019 1446   GFRAA >60 10/25/2017 1055    No results found for: TOTALPROTELP, ALBUMINELP, A1GS, A2GS, BETS, BETA2SER, GAMS, MSPIKE, SPEI  No results found for: KPAFRELGTCHN, LAMBDASER, KAPLAMBRATIO  Lab Results  Component Value Date   WBC 7.9 05/14/2019   NEUTROABS 4.1 05/14/2019   HGB 12.2 05/14/2019   HCT 38.1 05/14/2019   MCV 84.7 05/14/2019   PLT 293 05/14/2019   No results found for: LABCA2  No components found for: [id number]  No results for input(s): INR in the last 168 hours.  No results found for: LABCA2  No results found for: HCW237  No results found for: SEG315  No results found for: VVO160  No results found for: CA2729  No components found for: HGQUANT  No  results found for: CEA1 / No results found for: CEA1   No results found for: AFPTUMOR  No results found for: CHROMOGRNA  No results found for: PSA1  Appointment on 05/14/2019  Component Date Value Ref Range Status  . Sodium 05/14/2019 141  135 - 145 mmol/L Final  . Potassium 05/14/2019 3.4* 3.5 - 5.1 mmol/L Final  . Chloride 05/14/2019 106  98 - 111 mmol/L Final  . CO2 05/14/2019 26  22 - 32 mmol/L Final  . Glucose, Bld 05/14/2019 95  70 - 99 mg/dL Final   Glucose reference range applies only to samples taken after fasting for at least 8 hours.  . BUN 05/14/2019 10  8 - 23 mg/dL Final  . Creatinine, Ser 05/14/2019 0.86  0.44 - 1.00 mg/dL Final  . Calcium 05/14/2019 9.6  8.9 - 10.3 mg/dL Final  . Total Protein 05/14/2019 7.2  6.5 - 8.1 g/dL Final  . Albumin 05/14/2019 4.1  3.5 - 5.0 g/dL Final  . AST 05/14/2019 16  15 - 41 U/L Final  . ALT 05/14/2019 19  0 - 44 U/L Final  . Alkaline Phosphatase 05/14/2019 126  38 - 126 U/L Final  . Total Bilirubin 05/14/2019 0.3  0.3 - 1.2 mg/dL Final  . GFR calc non Af Amer 05/14/2019 >60  >60 mL/min Final  . GFR calc Af Amer 05/14/2019 >60  >60 mL/min Final  . Anion gap 05/14/2019 9  5 - 15 Final   Performed at [facility], [place] [address] [place], [place] [address]  . WBC 05/14/2019 7.9  4.0 - 10.5 K/uL Final  . RBC 05/14/2019 4.50  3.87 - 5.11 MIL/uL Final  . Hemoglobin 05/14/2019 12.2  12.0 - 15.0 g/dL Final  . HCT 05/14/2019 38.1  36.0 - 46.0 % Final  . MCV 05/14/2019 84.7  80.0 - 100.0 fL Final  . MCH 05/14/2019 27.1  26.0 - 34.0 pg  Final  . MCHC 05/14/2019 32.0  30.0 - 36.0 g/dL Final  . RDW 05/14/2019 14.4  11.5 - 15.5 % Final  . Platelets 05/14/2019 293  150 - 400 K/uL Final  . nRBC 05/14/2019 0.0  0.0 - 0.2 % Final  . Neutrophils Relative % 05/14/2019 52  % Final  . Neutro Abs 05/14/2019 4.1  1.7 - 7.7 K/uL Final  . Lymphocytes Relative 05/14/2019 38  % Final  . Lymphs Abs 05/14/2019 3.0  0.7 - 4.0 K/uL  Final  . Monocytes Relative 05/14/2019 8  % Final  . Monocytes Absolute 05/14/2019 0.6  0.1 - 1.0 K/uL Final  . Eosinophils Relative 05/14/2019 2  % Final  . Eosinophils Absolute 05/14/2019 0.1  0.0 - 0.5 K/uL Final  . Basophils Relative 05/14/2019 0  % Final  . Basophils Absolute 05/14/2019 0.0  0.0 - 0.1 K/uL Final  . Immature Granulocytes 05/14/2019 0  % Final  . Abs Immature Granulocytes 05/14/2019 0.02  0.00 - 0.07 K/uL Final   Performed at [facility], [place] [address], [place], [place] [address]    (this displays the last labs from the last 3 days)  No results found for: TOTALPROTELP, ALBUMINELP, A1GS, A2GS, BETS, BETA2SER, GAMS, MSPIKE, SPEI (this displays SPEP labs)  No results found for: KPAFRELGTCHN, LAMBDASER, KAPLAMBRATIO (kappa/lambda light chains)  No results found for: HGBA, HGBA2QUANT, HGBFQUANT, HGBSQUAN (Hemoglobinopathy evaluation)   No results found for: LDH  No results found for: IRON, TIBC, IRONPCTSAT (Iron and TIBC)  No results found for: FERRITIN  Urinalysis    Component Value Date/Time   BILIRUBINUR n 02/01/2019 0909   KETONESUR negative 10/07/2017 0939   PROTEINUR Negative 02/01/2019 0909   UROBILINOGEN negative (A) 02/01/2019 0909   NITRITE n 02/01/2019 0909   LEUKOCYTESUR Negative 02/01/2019 0909     STUDIES: MR CERVICAL SPINE WO CONTRAST  Result Date: 05/04/2019 CLINICAL DATA:  Neck pain and right arm and hand pain. Symptoms since January 2021. History of breast cancer 2019. EXAM: MRI CERVICAL SPINE WITHOUT CONTRAST TECHNIQUE: Multiplanar, multisequence MR imaging of the cervical spine was performed. No intravenous contrast was administered. COMPARISON:  MRI 01/21/2017 FINDINGS: Alignment: Mild progressive degenerative posterior subluxation of C5. Vertebrae: Normal marrow signal. No worrisome bone lesions or fractures. Cord: Normal cord signal intensity. No cord lesions or syrinx. Posterior Fossa, vertebral arteries,  paraspinal tissues: The visualized brainstem and cerebellum appear normal. There is an enlarging left thyroid nodule this measures approximately 2.3 x 1.8 cm and previously measured 1.8 x 1.5 cm. Recommend thyroid ultrasound examination and possible biopsy. Disc levels: C2-3: No significant findings C3-4: Bulging annulus and osteophytic ridging with flattening of the ventral thecal sac and narrowing the ventral CSF space. There is also mild uncinate spurring changes and mild facet disease contributing to mild bilateral foraminal stenosis which appears stable. C4-5: Slight bulging uncovered disc and stable mild uncinate spurring with mild foraminal narrowing on the left. No significant spinal or right foraminal stenosis. C5-6: Moderate degenerative disc disease with a bulging degenerated and uncovered disc. There is mass effect on the ventral thecal sac and narrowing the ventral CSF space but no significant spinal stenosis. There is significant foraminal stenosis bilaterally due to bilateral disc osteophyte complexes. This is slightly greater on the right side. It appears relatively stable. C6-7: Bulging and slightly uncovered disc with mild flattening of the ventral thecal sac and slight narrowing the ventral CSF space. Mild foraminal stenosis bilaterally appears stable. C7-T1: No significant findings. IMPRESSION:  1. Degenerative cervical spondylosis with multilevel disc disease and facet disease. 2. Stable mild bilateral foraminal stenosis at C3-4. 3. Stable mild left foraminal narrowing at C4-5. 4. Stable significant bilateral foraminal stenosis at C5-6. 5. Mild bilateral foraminal stenosis at C6-7, stable. 6. Enlarging left thyroid nodule. Recommend thyroid ultrasound examination and possible biopsy. Electronically Signed   By: Marijo Sanes M.D.   On: 05/04/2019 09:04     ELIGIBLE FOR AVAILABLE RESEARCH PROTOCOL: No   ASSESSMENT: 64 y.o. [place], [place] woman status post left breast upper outer quadrant  and left axillary lymph node biopsy 08/07/2017, both positive for a T2 N1, stage IIB invasive ductal carcinoma, grade 3, estrogen receptor weakly positive, progesterone receptor negative, but HER-2 amplified, with an MIB-1 of 80%  (a) staging chest CT and bone scan 08/30/2017 showed no evidence of metastatic disease  (1) neoadjuvant chemotherapy will consist of carboplatin, docetaxel, trastuzumab, and Pertuzumab given every 21 days x 6 starting 09/07/2017, perjeta omitted with cycle 2 and 3 due to diarrhea  (a) Gemcitabine substituted for Docetaxel beginning with cycle 5 and 6 for concerns of neuropathy  (2) trastuzumab continued to complete 6 months, last dose 04/06/2018  (a) echocardiogram 08/28/2017 showed an ejection fraction in the 60-65% range.  (b) echocardiogram on 11/29/2017 showed an EF of 55-60%  (c) echocardiogram 03/07/2018 showed an ejection fraction in the 55-60% range  (3) Left lumpectomy on 01/22/2018 shows a ypT1b pN1a, grade 3 residual invasive ductal carcinoma, agnostic panel HER-2 negative, ER 40% weakly positive, PR negative.   (a) foundation one testing requested on 02/02/2018  (b) total of 11 axillary lymph nodes removed (1+)  (4) adjuvant radiation completed 04/20/2018  (5) started anastrozole 05/30/2018  (a) bone density 10/25/2016 normal with a T score of -0.7  (b) bone density 11/04/2019   PLAN: Anissa is now a little over a year out from definitive surgery for her breast cancer with no evidence of disease recurrence.  This is very favorable.  She is tolerating anastrozole well and the plan is to continue that for minimum of 5 years.  She understands sensitivity soreness and shooting pains in the surgical breast are very common and do not indicate recurrence of breast cancer.  They just mean that she had trauma there namely the surgery.  I encouraged her to go ahead and receive the Covid vaccine which she already is eager to do.  See me again in October, after  her mammography in September.  We will do a bone density at that time as well.  From that point I will start seeing her on a once a year basis  She will discussed the incidentally noted thyroid nodule with her primary care physician for likely ultrasound and fine-needle biopsy.  Total encounter time 30 minutes.*.  Deandre Brannan, [name] Dad, MD  05/14/19 3:28 PM Medical Oncology and Hematology [facility] [place], [place] [address] Tel. [phone]    Fax. [phone]    I, Wilburn Mylar, am acting as scribe for Dr. [name] Dad. Drake Wuertz.  I, Lurline Del MD, have reviewed the above documentation for accuracy and completeness, and I agree with the above.   *Total Encounter Time as defined by the Centers for Medicare and Medicaid Services includes, in addition to the face-to-face time of a patient visit (documented in the note above) non-face-to-face time: obtaining and reviewing outside history, ordering and reviewing medications, tests or procedures, care coordination (communications with other health care professionals or caregivers) and documentation in  the medical record.

## 2019-05-14 ENCOUNTER — Inpatient Hospital Stay: Payer: 59 | Admitting: Oncology

## 2019-05-14 ENCOUNTER — Inpatient Hospital Stay: Payer: 59 | Attending: Oncology

## 2019-05-14 ENCOUNTER — Other Ambulatory Visit: Payer: 59

## 2019-05-14 ENCOUNTER — Ambulatory Visit: Payer: 59 | Admitting: Oncology

## 2019-05-14 ENCOUNTER — Other Ambulatory Visit: Payer: Self-pay

## 2019-05-14 VITALS — BP 137/73 | HR 73 | Temp 97.8°F | Resp 18 | Ht 67.5 in | Wt 206.5 lb

## 2019-05-14 DIAGNOSIS — C773 Secondary and unspecified malignant neoplasm of axilla and upper limb lymph nodes: Secondary | ICD-10-CM | POA: Insufficient documentation

## 2019-05-14 DIAGNOSIS — M50322 Other cervical disc degeneration at C5-C6 level: Secondary | ICD-10-CM | POA: Diagnosis not present

## 2019-05-14 DIAGNOSIS — C50412 Malignant neoplasm of upper-outer quadrant of left female breast: Secondary | ICD-10-CM

## 2019-05-14 DIAGNOSIS — E041 Nontoxic single thyroid nodule: Secondary | ICD-10-CM | POA: Diagnosis not present

## 2019-05-14 DIAGNOSIS — Z170 Estrogen receptor positive status [ER+]: Secondary | ICD-10-CM | POA: Insufficient documentation

## 2019-05-14 DIAGNOSIS — Z923 Personal history of irradiation: Secondary | ICD-10-CM | POA: Insufficient documentation

## 2019-05-14 DIAGNOSIS — Z79811 Long term (current) use of aromatase inhibitors: Secondary | ICD-10-CM | POA: Insufficient documentation

## 2019-05-14 LAB — COMPREHENSIVE METABOLIC PANEL
ALT: 19 U/L (ref 0–44)
AST: 16 U/L (ref 15–41)
Albumin: 4.1 g/dL (ref 3.5–5.0)
Alkaline Phosphatase: 126 U/L (ref 38–126)
Anion gap: 9 (ref 5–15)
BUN: 10 mg/dL (ref 8–23)
CO2: 26 mmol/L (ref 22–32)
Calcium: 9.6 mg/dL (ref 8.9–10.3)
Chloride: 106 mmol/L (ref 98–111)
Creatinine, Ser: 0.86 mg/dL (ref 0.44–1.00)
GFR calc Af Amer: >60 mL/min (ref >60)
GFR calc non Af Amer: >60 mL/min (ref >60)
Glucose, Bld: 95 mg/dL (ref 70–99)
Potassium: 3.4 mmol/L — ABNORMAL LOW (ref 3.5–5.1)
Sodium: 141 mmol/L (ref 135–145)
Total Bilirubin: 0.3 mg/dL (ref 0.3–1.2)
Total Protein: 7.2 g/dL (ref 6.5–8.1)

## 2019-05-14 LAB — CBC WITH DIFFERENTIAL/PLATELET
Abs Immature Granulocytes: 0.02 10*3/uL (ref 0.00–0.07)
Basophils Absolute: 0 10*3/uL (ref 0.0–0.1)
Basophils Relative: 0 %
Eosinophils Absolute: 0.1 10*3/uL (ref 0.0–0.5)
Eosinophils Relative: 2 %
HCT: 38.1 % (ref 36.0–46.0)
Hemoglobin: 12.2 g/dL (ref 12.0–15.0)
Immature Granulocytes: 0 %
Lymphocytes Relative: 38 %
Lymphs Abs: 3 10*3/uL (ref 0.7–4.0)
MCH: 27.1 pg (ref 26.0–34.0)
MCHC: 32 g/dL (ref 30.0–36.0)
MCV: 84.7 fL (ref 80.0–100.0)
Monocytes Absolute: 0.6 10*3/uL (ref 0.1–1.0)
Monocytes Relative: 8 %
Neutro Abs: 4.1 10*3/uL (ref 1.7–7.7)
Neutrophils Relative %: 52 %
Platelets: 293 10*3/uL (ref 150–400)
RBC: 4.5 MIL/uL (ref 3.87–5.11)
RDW: 14.4 % (ref 11.5–15.5)
WBC: 7.9 10*3/uL (ref 4.0–10.5)
nRBC: 0 % (ref 0.0–0.2)

## 2019-05-16 ENCOUNTER — Telehealth: Payer: Self-pay | Admitting: Oncology

## 2019-05-16 NOTE — Telephone Encounter (Signed)
Scheduled appts per 3/16 los. Pt confirmed appt date and time.

## 2019-05-21 ENCOUNTER — Encounter: Payer: Self-pay | Admitting: Certified Nurse Midwife

## 2019-05-24 ENCOUNTER — Ambulatory Visit: Payer: 59 | Admitting: Family Medicine

## 2019-05-29 ENCOUNTER — Telehealth: Payer: Self-pay | Admitting: *Deleted

## 2019-05-29 ENCOUNTER — Other Ambulatory Visit: Payer: Self-pay | Admitting: *Deleted

## 2019-05-29 DIAGNOSIS — Z170 Estrogen receptor positive status [ER+]: Secondary | ICD-10-CM

## 2019-05-29 DIAGNOSIS — C50412 Malignant neoplasm of upper-outer quadrant of left female breast: Secondary | ICD-10-CM

## 2019-05-29 DIAGNOSIS — E041 Nontoxic single thyroid nodule: Secondary | ICD-10-CM

## 2019-05-29 NOTE — Telephone Encounter (Signed)
This RN placed a referral, as well as called and left VM on new patient scheduler for Dr Harlow Asa for consultation for thyroid

## 2019-06-14 ENCOUNTER — Other Ambulatory Visit: Payer: Self-pay | Admitting: Surgery

## 2019-06-14 DIAGNOSIS — E041 Nontoxic single thyroid nodule: Secondary | ICD-10-CM

## 2019-06-16 ENCOUNTER — Other Ambulatory Visit: Payer: Self-pay | Admitting: Oncology

## 2019-06-27 ENCOUNTER — Ambulatory Visit
Admission: RE | Admit: 2019-06-27 | Discharge: 2019-06-27 | Disposition: A | Discharge status: Home / Self Care | Payer: 59 | Source: Ambulatory Visit | Attending: Surgery | Admitting: Surgery

## 2019-06-27 DIAGNOSIS — E041 Nontoxic single thyroid nodule: Secondary | ICD-10-CM

## 2019-07-15 ENCOUNTER — Other Ambulatory Visit: Payer: Self-pay | Admitting: Surgery

## 2019-07-15 DIAGNOSIS — E041 Nontoxic single thyroid nodule: Secondary | ICD-10-CM

## 2019-07-16 ENCOUNTER — Other Ambulatory Visit: Payer: Self-pay

## 2019-07-16 ENCOUNTER — Ambulatory Visit
Admission: RE | Admit: 2019-07-16 | Discharge: 2019-07-16 | Disposition: A | Discharge status: Home / Self Care | Payer: 59 | Source: Ambulatory Visit | Attending: Surgery | Admitting: Surgery

## 2019-07-16 ENCOUNTER — Other Ambulatory Visit (HOSPITAL_COMMUNITY)
Admission: RE | Admit: 2019-07-16 | Discharge: 2019-07-16 | Disposition: A | Discharge status: Home / Self Care | Payer: 59 | Source: Ambulatory Visit | Attending: Physician Assistant | Admitting: Physician Assistant

## 2019-07-16 DIAGNOSIS — E041 Nontoxic single thyroid nodule: Secondary | ICD-10-CM | POA: Diagnosis present

## 2019-07-17 LAB — CYTOLOGY - NON PAP

## 2019-07-18 LAB — CYTOLOGY - NON PAP

## 2019-07-24 ENCOUNTER — Ambulatory Visit: Payer: Self-pay | Admitting: Surgery

## 2019-08-01 ENCOUNTER — Encounter (HOSPITAL_COMMUNITY): Payer: Self-pay | Admitting: Diagnostic Radiology

## 2019-09-09 NOTE — Progress Notes (Addendum)
COVID Vaccine Completed: Yes Date COVID Vaccine completed: 07/22/19 COVID vaccine manufacturer: Pfizer       PCP - Dr. Burnett Harry Cardiologist - Dr. Keturah Barre. Bensimhon  Chest x-ray - 12/23/20in epic EKG - greater than 1 year Stress Test - N/A ECHO - 08/02/2018 in epic Cardiac Cath - N/A  Sleep Study - N/A CPAP -  N/A  Fasting Blood Sugar - N/A Checks Blood Sugar __N/A___ times a day  Blood Thinner Instructions: N/A Aspirin Instructions: N/A Last Dose: N/A  Anesthesia review:  N/A  Patient denies shortness of breath, fever, cough and chest pain at PAT appointment   Patient verbalized understanding of instructions that were given to them at the PAT appointment. Patient was also instructed that they will need to review over the PAT instructions again at home before surgery.

## 2019-09-09 NOTE — Patient Instructions (Addendum)
DUE TO COVID-19 ONLY ONE VISITOR ARE ALLOWED TO COME WITH YOU AND STAY IN THE WAITING ROOM ONLY DURING PRE OP AND PROCEDURE. THEN TWO VISITORS MAY VISIT WITH YOU IN YOUR PRIVATE ROOM DURING VISITING HOURS ONLY!!   COVID SWAB TESTING MUST BE COMPLETED ON: Tuesday, September 17, 2019 at 3:00 PM     [address], [place] -Former [facility] enter pre surgical testing line (Must self quarantine after testing. Follow instructions on handout.)         Your procedure is scheduled on: Friday, September 20, 2019   Report to [facility] Main  Entrance   Report to Short Stay at 5:30 AM   [facility])   Call this number if you have problems the morning of surgery [phone]   Do not eat food :After Midnight.   May have liquids until 4:30 AM day of surgery   CLEAR LIQUID DIET  Foods Allowed                                                                     Foods Excluded  Water, Black Coffee and tea, regular and decaf                             liquids that you cannot  Plain Jell-O in any flavor  (No red)                                           see through such as: Fruit ices (not with fruit pulp)                                     milk, soups, orange juice  Iced Popsicles (No red)                                    All solid food                                   Apple juices Sports drinks like Gatorade (No red) Lightly seasoned clear broth or consume(fat free) Sugar, honey syrup  Sample Menu Breakfast                                Lunch                                     Supper Cranberry juice                    Beef broth                            Chicken broth Jell-O  Grape juice                           Apple juice Coffee or tea                        Jell-O                                      Popsicle                                                Coffee or tea                        Coffee or tea    Oral Hygiene is  also important to reduce your risk of infection.                                    Remember - BRUSH YOUR TEETH THE MORNING OF SURGERY WITH YOUR REGULAR TOOTHPASTE   Do NOT smoke after Midnight   Take these medicines the morning of surgery with A SIP OF WATER: Anastrozole   May use Flonase the morning of surgery                               You may not have any metal on your body including hair pins, jewelry, and body piercings             Do not wear make-up, lotions, powders, perfumes/cologne, or deodorant             Do not wear nail polish.  Do not shave  48 hours prior to surgery.                Do not bring valuables to the hospital. [place].   Contacts, dentures or bridgework may not be worn into surgery.   Bring small overnight bag day of surgery.    Special Instructions: Bring a copy of your healthcare power of attorney and living will documents         the day of surgery if you haven't scanned them in before.              Please read over the following fact sheets you were given: IF YOU HAVE QUESTIONS ABOUT YOUR PRE OP INSTRUCTIONS PLEASE CALL [phone]   [facility] - Preparing for Surgery Before surgery, you can play an important role.  Because skin is not sterile, your skin needs to be as free of germs as possible.  You can reduce the number of germs on your skin by washing with CHG (chlorahexidine gluconate) soap before surgery.  CHG is an antiseptic cleaner which kills germs and bonds with the skin to continue killing germs even after washing. Please DO NOT use if you have an allergy to CHG or antibacterial soaps.  If your skin becomes reddened/irritated stop using the CHG and inform your nurse when you arrive  at Short Stay. Do not shave (including legs and underarms) for at least 48 hours prior to the first CHG shower.  You may shave your face/neck.  Please follow these instructions carefully:  1.  Shower with CHG  Soap the night before surgery and the  morning of surgery.  2.  If you choose to wash your hair, wash your hair first as usual with your normal  shampoo.  3.  After you shampoo, rinse your hair and body thoroughly to remove the shampoo.                             4.  Use CHG as you would any other liquid soap.  You can apply chg directly to the skin and wash.  Gently with a scrungie or clean washcloth.  5.  Apply the CHG Soap to your body ONLY FROM THE NECK DOWN.   Do   not use on face/ open                           Wound or open sores. Avoid contact with eyes, ears mouth and   genitals (private parts).                       Wash face,  Genitals (private parts) with your normal soap.             6.  Wash thoroughly, paying special attention to the area where your    surgery  will be performed.  7.  Thoroughly rinse your body with warm water from the neck down.  8.  DO NOT shower/wash with your normal soap after using and rinsing off the CHG Soap.                9.  Pat yourself dry with a clean towel.            10.  Wear clean pajamas.            11.  Place clean sheets on your bed the night of your first shower and do not  sleep with pets. Day of Surgery : Do not apply any lotions/deodorants the morning of surgery.  Please wear clean clothes to the hospital/surgery center.  FAILURE TO FOLLOW THESE INSTRUCTIONS MAY RESULT IN THE CANCELLATION OF YOUR SURGERY  PATIENT SIGNATURE_________________________________  NURSE SIGNATURE__________________________________  ________________________________________________________________________

## 2019-09-10 ENCOUNTER — Encounter (HOSPITAL_COMMUNITY)
Admission: RE | Admit: 2019-09-10 | Discharge: 2019-09-10 | Disposition: A | Discharge status: Home / Self Care | Payer: 59 | Source: Ambulatory Visit | Attending: Surgery | Admitting: Surgery

## 2019-09-10 ENCOUNTER — Other Ambulatory Visit: Payer: Self-pay

## 2019-09-10 ENCOUNTER — Ambulatory Visit (HOSPITAL_COMMUNITY)
Admission: RE | Admit: 2019-09-10 | Discharge: 2019-09-10 | Disposition: A | Discharge status: Home / Self Care | Payer: 59 | Source: Ambulatory Visit | Attending: Anesthesiology | Admitting: Anesthesiology

## 2019-09-10 ENCOUNTER — Encounter (HOSPITAL_COMMUNITY): Payer: Self-pay

## 2019-09-10 DIAGNOSIS — Z01818 Encounter for other preprocedural examination: Secondary | ICD-10-CM | POA: Diagnosis not present

## 2019-09-10 HISTORY — DX: Iron deficiency anemia, unspecified: D50.9

## 2019-09-10 HISTORY — DX: Neoplasm of unspecified behavior of endocrine glands and other parts of nervous system: D49.7

## 2019-09-10 HISTORY — DX: Malignant neoplasm of thyroid gland: C73

## 2019-09-10 HISTORY — DX: Gastro-esophageal reflux disease without esophagitis: K21.9

## 2019-09-10 LAB — CBC
HCT: 37.8 % (ref 36.0–46.0)
Hemoglobin: 11.5 g/dL — ABNORMAL LOW (ref 12.0–15.0)
MCH: 26.2 pg (ref 26.0–34.0)
MCHC: 30.4 g/dL (ref 30.0–36.0)
MCV: 86.1 fL (ref 80.0–100.0)
Platelets: 344 10*3/uL (ref 150–400)
RBC: 4.39 MIL/uL (ref 3.87–5.11)
RDW: 13.8 % (ref 11.5–15.5)
WBC: 7.9 10*3/uL (ref 4.0–10.5)
nRBC: 0 % (ref 0.0–0.2)

## 2019-09-10 LAB — BASIC METABOLIC PANEL
Anion gap: 7 (ref 5–15)
BUN: 12 mg/dL (ref 8–23)
CO2: 27 mmol/L (ref 22–32)
Calcium: 9.8 mg/dL (ref 8.9–10.3)
Chloride: 102 mmol/L (ref 98–111)
Creatinine, Ser: 0.8 mg/dL (ref 0.44–1.00)
GFR calc Af Amer: >60 mL/min (ref >60)
GFR calc non Af Amer: >60 mL/min (ref >60)
Glucose, Bld: 93 mg/dL (ref 70–99)
Potassium: 4 mmol/L (ref 3.5–5.1)
Sodium: 136 mmol/L (ref 135–145)

## 2019-09-10 LAB — HEMOGLOBIN A1C
Hgb A1c MFr Bld: 6.6 % — ABNORMAL HIGH (ref 4.8–5.6)
Mean Plasma Glucose: 142.72 mg/dL

## 2019-09-16 ENCOUNTER — Encounter (HOSPITAL_COMMUNITY): Payer: Self-pay | Admitting: Surgery

## 2019-09-16 DIAGNOSIS — D440 Neoplasm of uncertain behavior of thyroid gland: Secondary | ICD-10-CM | POA: Diagnosis present

## 2019-09-16 DIAGNOSIS — E042 Nontoxic multinodular goiter: Secondary | ICD-10-CM | POA: Diagnosis present

## 2019-09-16 NOTE — H&P (Signed)
General Surgery [facility]  KIASIA CHOU DOB: [birth year] Single / Language: [name] / Race: Black or African American Female   History of Present Illness   The patient is a 64 year old female who presents with a thyroid nodule.  CHIEF COMPLAINT: left thyroid nodule  Patient is referred by Dr. Gunnar Bulla [name] for surgical evaluation and management of newly diagnosed left thyroid nodule. Patient has a significant past medical history and surgical history for breast cancer. She has been managed by my partner, Dr. [name]. Dr. Dalbert Batman is now retired. Patient had undergone evaluation for neck pain including an MRI scan which was performed on May 04, 2019. Incidental finding was made of a left thyroid nodule which had been present on her previous study in 2018. In the interim, this nodule had shown enlargement from 1.8 cm to 2.3 cm. Further evaluation was recommended. Patient has no prior history of thyroid disease. She has had no prior head or neck surgery. She has never been on thyroid medication. There is no family history of thyroid disease no history of thyroid malignancy. Patient has not had any further studies. She has not had a recent TSH level. Patient denies tremors or palpitations. 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   Past Surgical History  Breast Mass; Local Excision  Left. Foot Surgery  Right. Hysterectomy (not due to cancer) - Complete  Oral Surgery  Resection of Small Bowel   Diagnostic Studies History  Colonoscopy  within last year Mammogram  within last year Pap Smear  1-5 years ago >5 years ago  Allergies  Penicillins  Allergies Reconciled   Medication History  Anastrozole (1MG  Tablet, Oral) Active. LORazepam (0.5MG  Tablet, Oral) Active. valACYclovir HCl (500MG  Tablet, Oral) Active. Fluticasone Propionate (50MCG/ACT Suspension, Nasal) Active. Medications Reconciled  Social History   Alcohol use  Moderate alcohol use. Caffeine use  Coffee, Tea. Illicit drug use  Prefer to discuss with provider. Tobacco use  Current some day smoker, Never smoker.  Family History  Alcohol Abuse  Mother. Colon Cancer  Father.  Pregnancy / Birth History  Age at menarche  67 years, 2 years. Age of menopause  30-55 <45 Gravida  2 Maternal age  13-20 Para  2  Other Problems  Back Pain  Breast Cancer  Hemorrhoids  Oophorectomy  Bilateral.  Review of Systems  General Present- Fatigue and Weight Gain. Not Present- Appetite Loss, Chills, Fever, Night Sweats and Weight Loss. Skin Present- Dryness. Not Present- Change in Wart/Mole, Hives, Jaundice, New Lesions, Non-Healing Wounds, Rash and Ulcer. HEENT Present- Seasonal Allergies, Sinus Pain and Wears glasses/contact lenses. Not Present- Earache, Hearing Loss, Hoarseness, Nose Bleed, Oral Ulcers, Ringing in the Ears, Sore Throat, Visual Disturbances and Yellow Eyes. Respiratory Not Present- Bloody sputum, Chronic Cough, Difficulty Breathing, Snoring and Wheezing. Breast Present- Breast Mass, Breast Pain and Skin Changes. Not Present- Nipple Discharge. Cardiovascular Not Present- Chest Pain, Difficulty Breathing Lying Down, Leg Cramps, Palpitations, Rapid Heart Rate, Shortness of Breath and Swelling of Extremities. Gastrointestinal Not Present- Abdominal Pain, Bloating, Bloody Stool, Change in Bowel Habits, Chronic diarrhea, Constipation, Difficulty Swallowing, Excessive gas, Gets full quickly at meals, Hemorrhoids, Indigestion, Nausea, Rectal Pain and Vomiting. Female Genitourinary Present- Frequency and Nocturia. Not Present- Painful Urination, Pelvic Pain and Urgency. Musculoskeletal Present- Back Pain, Joint Pain and Joint Stiffness. Not Present- Muscle Pain, Muscle Weakness and Swelling of Extremities. Neurological Present- Numbness and Tingling. Not Present- Decreased Memory, Fainting, Headaches, Seizures, Tremor,  Trouble walking  and Weakness. Psychiatric Not Present- Anxiety, Bipolar, Change in Sleep Pattern, Depression, Fearful and Frequent crying. Endocrine Not Present- Cold Intolerance, Excessive Hunger, Hair Changes, Heat Intolerance, Hot flashes and New Diabetes. Hematology Not Present- Blood Thinners, Easy Bruising, Excessive bleeding, Gland problems, HIV and Persistent Infections.  Vitals  Weight: 207.4 lb Height: 69in Body Surface Area: 2.1 m Body Mass Index: 30.63 kg/m  Temp.: 25F  Pulse: 88 (Regular)  BP: 164/86(Sitting, Left Arm, Standard)  Physical Exam  GENERAL APPEARANCE Development: normal Nutritional status: normal Gross deformities: none  SKIN Rash, lesions, ulcers: none Induration, erythema: none Nodules: none palpable  EYES Conjunctiva and lids: normal Pupils: equal and reactive Iris: normal bilaterally  EARS, NOSE, MOUTH, THROAT External ears: no lesion or deformity External nose: no lesion or deformity Hearing: grossly normal Due to Covid-19 pandemic, patient is wearing a mask.  NECK Symmetric: no Trachea: midline Thyroid: Palpation of the left thyroid lobe shows a dominant nodule measuring approximately 2-1/2-3 cm in size, smooth, relatively firm, mobile with swallowing, and nontender. Palpation of the right thyroid lobe shows no significant nodularity. There is no associated lymphadenopathy.  CHEST Respiratory effort: normal Retraction or accessory muscle use: no Breath sounds: normal bilaterally Rales, rhonchi, wheeze: none  CARDIOVASCULAR Auscultation: regular rhythm, normal rate Murmurs: none Pulses: radial pulse 2+ palpable Lower extremity edema: none  MUSCULOSKELETAL Station and gait: normal Digits and nails: no clubbing or cyanosis Muscle strength: grossly normal all extremities Range of motion: grossly normal all extremities Deformity: none  LYMPHATIC Cervical: none palpable Supraclavicular: none  palpable  PSYCHIATRIC Oriented to person, place, and time: yes Mood and affect: normal for situation Judgment and insight: appropriate for situation    Assessment & Plan  LEFT THYROID NODULE (E04.1)  Follow Up - Call [facility] office after tests / studies doneto discuss further plans  Patient is referred by her medical oncologist for surgical evaluation and management of a newly diagnosed left thyroid nodule.  Patient provided with a copy of "The Thyroid Book: Medical and Surgical Treatment of Thyroid Problems", published by [employer], 16 pages. Book reviewed and explained to patient during visit today.  Patient has no prior history of thyroid disease. On sequential MRI scans from 2018 and from March 2021 there is an enlarging left thyroid nodule now measuring 2.3 cm. We will arrange for ultrasound examination with possible fine-needle aspiration biopsy of the dominant nodule on the left side performed concurrently. We will also obtain a TSH level today.  The patient and I discussed this nodule. I provided her with written literature regarding thyroid nodules and their management. We will contact her once the ultrasound report is available and if there are any significant findings on fine-needle aspiration biopsy.  ADDENDUM  Telephone call to patient with results of fine-needle aspiration biopsies performed on Jul 16, 2019. Both nodules were in the left thyroid lobe. The larger nodule demonstrated cytologic atypia. The smaller nodule was suspicious for malignancy, a Bethesda category V. Ultrasound demonstrates bilateral thyroid nodules as well as nodules in the thyroid isthmus. I have recommended proceeding with total thyroidectomy. We have discussed the risk and benefits of the procedure including the risk of recurrent laryngeal nerve injury and injury to parathyroid glands. Patient would like to proceed with surgery in the near future.  The risks and benefits of the procedure have  been discussed at length with the patient. The patient understands the proposed procedure, potential alternative treatments, and the course of recovery to be expected. All of the patient's questions  have been answered at this time. The patient wishes to proceed with surgery.  [name], MD [facility] Office: [phone]

## 2019-09-17 ENCOUNTER — Other Ambulatory Visit (HOSPITAL_COMMUNITY): Payer: 59

## 2019-09-17 ENCOUNTER — Other Ambulatory Visit (HOSPITAL_COMMUNITY)
Admission: RE | Admit: 2019-09-17 | Discharge: 2019-09-17 | Disposition: A | Discharge status: Home / Self Care | Payer: 59 | Source: Ambulatory Visit | Attending: Surgery | Admitting: Surgery

## 2019-09-17 DIAGNOSIS — Z01812 Encounter for preprocedural laboratory examination: Secondary | ICD-10-CM | POA: Diagnosis not present

## 2019-09-17 DIAGNOSIS — Z20822 Contact with and (suspected) exposure to covid-19: Secondary | ICD-10-CM | POA: Diagnosis not present

## 2019-09-17 LAB — SARS CORONAVIRUS 2 (TAT 6-24 HRS): SARS Coronavirus 2: NEGATIVE

## 2019-09-19 NOTE — Anesthesia Preprocedure Evaluation (Addendum)
Anesthesia Evaluation  Patient identified by MRN, date of birth, ID band Patient awake    Reviewed: Allergy & Precautions, NPO status , Patient's Chart, lab work & pertinent test results  History of Anesthesia Complications Negative for: history of anesthetic complications  Airway Mallampati: II  TM Distance: >3 FB Neck ROM: Full    Dental  (+) Teeth Intact   Pulmonary neg pulmonary ROS,    Pulmonary exam normal        Cardiovascular negative cardio ROS Normal cardiovascular exam     Neuro/Psych negative neurological ROS  negative psych ROS   GI/Hepatic Neg liver ROS, GERD  ,  Endo/Other  negative endocrine ROS  Renal/GU negative Renal ROS  negative genitourinary   Musculoskeletal negative musculoskeletal ROS (+)   Abdominal   Peds  Hematology  (+) anemia ,   Anesthesia Other Findings  Breast cancer Thyroid cancer  Reproductive/Obstetrics                            Anesthesia Physical Anesthesia Plan  ASA: II  Anesthesia Plan: General   Post-op Pain Management:    Induction: Intravenous  PONV Risk Score and Plan: 3 and Ondansetron, Dexamethasone, Treatment may vary due to age or medical condition and Midazolam  Airway Management Planned: Oral ETT  Additional Equipment: None  Intra-op Plan:   Post-operative Plan: Extubation in OR  Informed Consent: I have reviewed the patients History and Physical, chart, labs and discussed the procedure including the risks, benefits and alternatives for the proposed anesthesia with the patient or authorized representative who has indicated his/her understanding and acceptance.     Dental advisory given  Plan Discussed with:   Anesthesia Plan Comments:        Anesthesia Quick Evaluation

## 2019-09-20 ENCOUNTER — Ambulatory Visit (HOSPITAL_COMMUNITY): Payer: 59 | Admitting: Anesthesiology

## 2019-09-20 ENCOUNTER — Encounter (HOSPITAL_COMMUNITY): Payer: Self-pay | Admitting: Surgery

## 2019-09-20 ENCOUNTER — Ambulatory Visit (HOSPITAL_COMMUNITY)
Admission: RE | Admit: 2019-09-20 | Discharge: 2019-09-21 | Disposition: A | Discharge status: Home / Self Care | Payer: 59 | Source: Ambulatory Visit | Attending: Surgery | Admitting: Surgery

## 2019-09-20 ENCOUNTER — Encounter (HOSPITAL_COMMUNITY)
Admission: RE | Disposition: A | Discharge status: Home / Self Care | Payer: Self-pay | Source: Ambulatory Visit | Attending: Surgery

## 2019-09-20 ENCOUNTER — Other Ambulatory Visit: Payer: Self-pay

## 2019-09-20 DIAGNOSIS — D649 Anemia, unspecified: Secondary | ICD-10-CM | POA: Diagnosis not present

## 2019-09-20 DIAGNOSIS — K219 Gastro-esophageal reflux disease without esophagitis: Secondary | ICD-10-CM | POA: Insufficient documentation

## 2019-09-20 DIAGNOSIS — M549 Dorsalgia, unspecified: Secondary | ICD-10-CM | POA: Diagnosis not present

## 2019-09-20 DIAGNOSIS — Z853 Personal history of malignant neoplasm of breast: Secondary | ICD-10-CM | POA: Diagnosis not present

## 2019-09-20 DIAGNOSIS — C73 Malignant neoplasm of thyroid gland: Secondary | ICD-10-CM | POA: Diagnosis not present

## 2019-09-20 DIAGNOSIS — F17200 Nicotine dependence, unspecified, uncomplicated: Secondary | ICD-10-CM | POA: Diagnosis not present

## 2019-09-20 DIAGNOSIS — Z79899 Other long term (current) drug therapy: Secondary | ICD-10-CM | POA: Insufficient documentation

## 2019-09-20 DIAGNOSIS — D440 Neoplasm of uncertain behavior of thyroid gland: Secondary | ICD-10-CM | POA: Diagnosis present

## 2019-09-20 DIAGNOSIS — Z880 Allergy status to penicillin: Secondary | ICD-10-CM | POA: Insufficient documentation

## 2019-09-20 DIAGNOSIS — E042 Nontoxic multinodular goiter: Secondary | ICD-10-CM | POA: Diagnosis present

## 2019-09-20 HISTORY — PX: THYROIDECTOMY: SHX17

## 2019-09-20 SURGERY — THYROIDECTOMY
Anesthesia: General | Site: Neck

## 2019-09-20 MED ORDER — ORAL CARE MOUTH RINSE: 15.0000 mL | Freq: Once | OROMUCOSAL | Status: AC

## 2019-09-20 MED ORDER — DEXAMETHASONE SODIUM PHOSPHATE 10 MG/ML IJ SOLN
INTRAMUSCULAR | Status: AC
  Filled 2019-09-20: qty 1

## 2019-09-20 MED ORDER — MIDAZOLAM HCL 2 MG/2ML IJ SOLN
INTRAMUSCULAR | Status: AC
  Filled 2019-09-20: qty 2

## 2019-09-20 MED ORDER — SUGAMMADEX SODIUM 200 MG/2ML IV SOLN
INTRAVENOUS | Status: DC | PRN
  Administered 2019-09-20: 190.2 mg via INTRAVENOUS

## 2019-09-20 MED ORDER — CALCIUM CARBONATE ANTACID 500 MG PO CHEW
2.0000 | CHEWABLE_TABLET | Freq: Three times a day (TID) | ORAL | 1 refills | Status: DC
Start: 2019-09-20 — End: 2019-10-30

## 2019-09-20 MED ORDER — LEVOTHYROXINE SODIUM 100 MCG PO TABS
100.0000 ug | ORAL_TABLET | Freq: Every day | ORAL | 11 refills | Status: DC
Start: 2019-09-20 — End: 2019-10-04

## 2019-09-20 MED ORDER — ONDANSETRON HCL 4 MG/2ML IJ SOLN
INTRAMUSCULAR | Status: DC | PRN
  Administered 2019-09-20: 4 mg via INTRAVENOUS

## 2019-09-20 MED ORDER — 0.9 % SODIUM CHLORIDE (POUR BTL) OPTIME
TOPICAL | Status: DC | PRN
  Administered 2019-09-20: 1000 mL

## 2019-09-20 MED ORDER — CIPROFLOXACIN IN D5W 400 MG/200ML IV SOLN
400.0000 mg | INTRAVENOUS | Status: AC
  Administered 2019-09-20: 400 mg via INTRAVENOUS
  Filled 2019-09-20: qty 200

## 2019-09-20 MED ORDER — TRAMADOL HCL 50 MG PO TABS
50.0000 mg | ORAL_TABLET | Freq: Four times a day (QID) | ORAL | Status: DC | PRN
  Administered 2019-09-20 (×2): 50 mg via ORAL
  Filled 2019-09-20 (×2): qty 1

## 2019-09-20 MED ORDER — CHLORHEXIDINE GLUCONATE 0.12 % MT SOLN
15.0000 mL | Freq: Once | OROMUCOSAL | Status: AC
  Administered 2019-09-20: 15 mL via OROMUCOSAL

## 2019-09-20 MED ORDER — CHLORHEXIDINE GLUCONATE CLOTH 2 % EX PADS: 6.0000 | MEDICATED_PAD | Freq: Once | CUTANEOUS | Status: DC

## 2019-09-20 MED ORDER — ONDANSETRON HCL 4 MG/2ML IJ SOLN: 4.0000 mg | Freq: Once | INTRAMUSCULAR | Status: DC | PRN

## 2019-09-20 MED ORDER — FENTANYL CITRATE (PF) 100 MCG/2ML IJ SOLN
INTRAMUSCULAR | Status: AC
  Administered 2019-09-20: 50 ug via INTRAVENOUS
  Filled 2019-09-20: qty 2

## 2019-09-20 MED ORDER — FENTANYL CITRATE (PF) 250 MCG/5ML IJ SOLN
INTRAMUSCULAR | Status: AC
  Filled 2019-09-20: qty 5

## 2019-09-20 MED ORDER — FLUTICASONE PROPIONATE 50 MCG/ACT NA SUSP: 1.0000 | Freq: Every day | NASAL | Status: DC | PRN

## 2019-09-20 MED ORDER — ACETAMINOPHEN 650 MG RE SUPP: 650.0000 mg | Freq: Four times a day (QID) | RECTAL | Status: DC | PRN

## 2019-09-20 MED ORDER — FENTANYL CITRATE (PF) 100 MCG/2ML IJ SOLN
25.0000 ug | INTRAMUSCULAR | Status: DC | PRN
  Administered 2019-09-20 (×2): 25 ug via INTRAVENOUS

## 2019-09-20 MED ORDER — OXYCODONE HCL 5 MG PO TABS: 5.0000 mg | ORAL_TABLET | Freq: Once | ORAL | Status: DC | PRN

## 2019-09-20 MED ORDER — DEXAMETHASONE SODIUM PHOSPHATE 10 MG/ML IJ SOLN
INTRAMUSCULAR | Status: DC | PRN
  Administered 2019-09-20: 8 mg via INTRAVENOUS

## 2019-09-20 MED ORDER — HYDROMORPHONE HCL 1 MG/ML IJ SOLN
1.0000 mg | INTRAMUSCULAR | Status: DC | PRN
  Administered 2019-09-20 (×2): 1 mg via INTRAVENOUS
  Filled 2019-09-20 (×2): qty 1

## 2019-09-20 MED ORDER — ROCURONIUM BROMIDE 10 MG/ML (PF) SYRINGE
PREFILLED_SYRINGE | INTRAVENOUS | Status: DC | PRN
  Administered 2019-09-20 (×2): 10 mg via INTRAVENOUS
  Administered 2019-09-20: 40 mg via INTRAVENOUS

## 2019-09-20 MED ORDER — ACETAMINOPHEN 325 MG PO TABS
650.0000 mg | ORAL_TABLET | Freq: Four times a day (QID) | ORAL | Status: DC | PRN
  Administered 2019-09-21: 650 mg via ORAL
  Filled 2019-09-20 (×2): qty 2

## 2019-09-20 MED ORDER — ONDANSETRON HCL 4 MG/2ML IJ SOLN
INTRAMUSCULAR | Status: AC
  Filled 2019-09-20: qty 2

## 2019-09-20 MED ORDER — KETOROLAC TROMETHAMINE 30 MG/ML IJ SOLN
INTRAMUSCULAR | Status: AC
  Administered 2019-09-20: 30 mg via INTRAVENOUS
  Filled 2019-09-20: qty 1

## 2019-09-20 MED ORDER — FENTANYL CITRATE (PF) 250 MCG/5ML IJ SOLN
INTRAMUSCULAR | Status: DC | PRN
  Administered 2019-09-20: 50 ug via INTRAVENOUS
  Administered 2019-09-20: 100 ug via INTRAVENOUS

## 2019-09-20 MED ORDER — ONDANSETRON HCL 4 MG/2ML IJ SOLN
4.0000 mg | Freq: Four times a day (QID) | INTRAMUSCULAR | Status: DC | PRN
  Administered 2019-09-20 (×2): 4 mg via INTRAVENOUS
  Filled 2019-09-20 (×2): qty 2

## 2019-09-20 MED ORDER — LIDOCAINE 2% (20 MG/ML) 5 ML SYRINGE
INTRAMUSCULAR | Status: AC
  Filled 2019-09-20: qty 5

## 2019-09-20 MED ORDER — PROPOFOL 10 MG/ML IV BOLUS
INTRAVENOUS | Status: AC
  Filled 2019-09-20: qty 20

## 2019-09-20 MED ORDER — PROPOFOL 10 MG/ML IV BOLUS
INTRAVENOUS | Status: DC | PRN
  Administered 2019-09-20: 170 mg via INTRAVENOUS

## 2019-09-20 MED ORDER — LACTATED RINGERS IV SOLN: INTRAVENOUS | Status: DC

## 2019-09-20 MED ORDER — ANASTROZOLE 1 MG PO TABS
1.0000 mg | ORAL_TABLET | Freq: Every day | ORAL | Status: DC
  Administered 2019-09-20: 1 mg via ORAL
  Filled 2019-09-20 (×2): qty 1

## 2019-09-20 MED ORDER — ONDANSETRON 4 MG PO TBDP: 4.0000 mg | ORAL_TABLET | Freq: Four times a day (QID) | ORAL | Status: DC | PRN

## 2019-09-20 MED ORDER — SODIUM CHLORIDE 0.45 % IV SOLN: INTRAVENOUS | Status: DC

## 2019-09-20 MED ORDER — ACETAMINOPHEN 10 MG/ML IV SOLN
1000.0000 mg | Freq: Once | INTRAVENOUS | Status: AC
  Administered 2019-09-20: 1000 mg via INTRAVENOUS

## 2019-09-20 MED ORDER — ACETAMINOPHEN 10 MG/ML IV SOLN
INTRAVENOUS | Status: AC
  Filled 2019-09-20: qty 100

## 2019-09-20 MED ORDER — PHENYLEPHRINE 40 MCG/ML (10ML) SYRINGE FOR IV PUSH (FOR BLOOD PRESSURE SUPPORT)
PREFILLED_SYRINGE | INTRAVENOUS | Status: DC | PRN
  Administered 2019-09-20 (×2): 80 ug via INTRAVENOUS

## 2019-09-20 MED ORDER — OXYCODONE HCL 5 MG PO TABS: 5.0000 mg | ORAL_TABLET | ORAL | Status: DC | PRN

## 2019-09-20 MED ORDER — OXYCODONE HCL 5 MG/5ML PO SOLN: 5.0000 mg | Freq: Once | ORAL | Status: DC | PRN

## 2019-09-20 MED ORDER — ROCURONIUM BROMIDE 10 MG/ML (PF) SYRINGE
PREFILLED_SYRINGE | INTRAVENOUS | Status: AC
  Filled 2019-09-20: qty 10

## 2019-09-20 MED ORDER — CALCIUM CARBONATE 1250 (500 CA) MG PO TABS
2.0000 | ORAL_TABLET | Freq: Three times a day (TID) | ORAL | Status: DC
  Administered 2019-09-20 – 2019-09-21 (×3): 1000 mg via ORAL
  Filled 2019-09-20 (×3): qty 1

## 2019-09-20 MED ORDER — MIDAZOLAM HCL 5 MG/5ML IJ SOLN
INTRAMUSCULAR | Status: DC | PRN
  Administered 2019-09-20: 2 mg via INTRAVENOUS

## 2019-09-20 MED ORDER — KETOROLAC TROMETHAMINE 30 MG/ML IJ SOLN: 30.0000 mg | Freq: Once | INTRAMUSCULAR | Status: AC

## 2019-09-20 MED ORDER — TRAMADOL HCL 50 MG PO TABS: 50.0000 mg | ORAL_TABLET | Freq: Four times a day (QID) | ORAL | 0 refills | Status: DC | PRN

## 2019-09-20 MED ORDER — PHENYLEPHRINE 40 MCG/ML (10ML) SYRINGE FOR IV PUSH (FOR BLOOD PRESSURE SUPPORT)
PREFILLED_SYRINGE | INTRAVENOUS | Status: AC
  Filled 2019-09-20: qty 10

## 2019-09-20 SURGICAL SUPPLY — 30 items
ADH SKN CLS APL DERMABOND .7 (GAUZE/BANDAGES/DRESSINGS) ×1
APL PRP STRL LF DISP 70% ISPRP (MISCELLANEOUS) ×1
ATTRACTOMAT 16X20 MAGNETIC DRP (DRAPES) ×2 IMPLANT
BLADE SURG 15 STRL LF DISP TIS (BLADE) ×1 IMPLANT
BLADE SURG 15 STRL SS (BLADE) ×2
CHLORAPREP W/TINT 26 (MISCELLANEOUS) ×2 IMPLANT
CLIP VESOCCLUDE MED 6/CT (CLIP) ×8 IMPLANT
CLIP VESOCCLUDE SM WIDE 6/CT (CLIP) ×7 IMPLANT
COVER SURGICAL LIGHT HANDLE (MISCELLANEOUS) ×2 IMPLANT
COVER WAND RF STERILE (DRAPES) ×2 IMPLANT
DERMABOND ADVANCED (GAUZE/BANDAGES/DRESSINGS) ×1
DERMABOND ADVANCED .7 DNX12 (GAUZE/BANDAGES/DRESSINGS) ×1 IMPLANT
DRAPE LAPAROTOMY T 98X78 PEDS (DRAPES) ×2 IMPLANT
ELECT PENCIL ROCKER SW 15FT (MISCELLANEOUS) ×2 IMPLANT
ELECT REM PT RETURN 15FT ADLT (MISCELLANEOUS) ×2 IMPLANT
GAUZE 4X4 16PLY RFD (DISPOSABLE) ×2 IMPLANT
GLOVE SURG ORTHO 8.0 STRL STRW (GLOVE) ×2 IMPLANT
GOWN STRL REUS W/TWL XL LVL3 (GOWN DISPOSABLE) ×4 IMPLANT
HEMOSTAT SURGICEL 2X4 FIBR (HEMOSTASIS) ×2 IMPLANT
ILLUMINATOR WAVEGUIDE N/F (MISCELLANEOUS) ×2 IMPLANT
KIT BASIN OR (CUSTOM PROCEDURE TRAY) ×2 IMPLANT
KIT TURNOVER KIT A (KITS) IMPLANT
PACK BASIC VI WITH GOWN DISP (CUSTOM PROCEDURE TRAY) ×2 IMPLANT
SHEARS HARMONIC 9CM CVD (BLADE) ×2 IMPLANT
SUT MNCRL AB 4-0 PS2 18 (SUTURE) ×2 IMPLANT
SUT VIC AB 3-0 SH 18 (SUTURE) ×4 IMPLANT
SYR BULB IRRIG 60ML STRL (SYRINGE) ×2 IMPLANT
TOWEL OR 17X26 10 PK STRL BLUE (TOWEL DISPOSABLE) ×2 IMPLANT
TOWEL OR NON WOVEN STRL DISP B (DISPOSABLE) ×2 IMPLANT
TUBING CONNECTING 10 (TUBING) ×2 IMPLANT

## 2019-09-20 NOTE — Plan of Care (Signed)
?  Problem: Education: ?Goal: Knowledge of General Education information will improve ?Description: Including pain rating scale, medication(s)/side effects and non-pharmacologic comfort measures ?Outcome: Progressing ?  ?Problem: Activity: ?Goal: Risk for activity intolerance will decrease ?Outcome: Progressing ?  ?Problem: Nutrition: ?Goal: Adequate nutrition will be maintained ?Outcome: Progressing ?  ?Problem: Coping: ?Goal: Level of anxiety will decrease ?Outcome: Progressing ?  ?Problem: Elimination: ?Goal: Will not experience complications related to urinary retention ?Outcome: Progressing ?  ?Problem: Pain Managment: ?Goal: General experience of comfort will improve ?Outcome: Progressing ?  ?

## 2019-09-20 NOTE — Interval H&P Note (Signed)
History and Physical Interval Note:  09/20/2019 7:00 AM  Madison Hopkins  has presented today for surgery, with the diagnosis of THYROID NEOPLASM OF UNCERTAIN BEHAVIOR, MULTIPLE THYROID NODULES.  The various methods of treatment have been discussed with the patient and family. After consideration of risks, benefits and other options for treatment, the patient has consented to    Procedure(s): TOTAL THYROIDECTOMY (N/A) as a surgical intervention.    The patient's history has been reviewed, patient examined, no change in status, stable for surgery.  I have reviewed the patient's chart and labs.  Questions were answered to the patient's satisfaction.    [name], MD [facility] Office: [place]

## 2019-09-20 NOTE — Discharge Instructions (Signed)
[facility]  THYROID & PARATHYROID SURGERY:  POST-OP INSTRUCTIONS  Always review your discharge instruction sheet from the facility where your surgery was performed.  A prescription for pain medication may be given to you upon discharge.  Take your pain medication as prescribed.  If narcotic pain medicine is not needed, then you may take acetaminophen (Tylenol) or ibuprofen (Advil) as needed.  Take your usually prescribed medications unless otherwise directed.  If you need a refill on your pain medication, please contact our office during regular business hours.  Prescriptions cannot be processed by our office after 5 pm or on weekends.  Start with a light diet upon arrival home, such as soup and crackers or toast.  Be sure to drink plenty of fluids daily.  Resume your normal diet the day after surgery.  Most patients will experience some swelling and bruising on the chest and neck area.  Ice packs will help.  Swelling and bruising can take several days to resolve.   It is common to experience some constipation after surgery.  Increasing fluid intake and taking a stool softener (Colace) will usually help or prevent this problem.  A mild laxative (Milk of Magnesia or Miralax) should be taken according to package directions if there has been no bowel movement after 48 hours.  You have steri-strips and a gauze dressing over your incision.  You may remove the gauze bandage on the second day after surgery, and you may shower at that time.  Leave your steri-strips (small skin tapes) in place directly over the incision.  These strips should remain on the skin for 5-7 days and then be removed.  You may get them wet in the shower and pat them dry.  You may resume regular (light) daily activities beginning the next day (such as daily self-care, walking, climbing stairs) gradually increasing activities as tolerated.  You may have sexual intercourse when it is comfortable.  Refrain from  any heavy lifting or straining until approved by your doctor.  You may drive when you no longer are taking prescription pain medication, you can comfortably wear a seatbelt, and you can safely maneuver your car and apply brakes.  You should see your doctor in the office for a follow-up appointment approximately three weeks after your surgery.  Make sure that you call for this appointment within a day or two after you arrive home to insure a convenient appointment time.  WHEN TO CALL YOUR DOCTOR: -- Fever greater than 101.5 -- Inability to urinate -- Nausea and/or vomiting - persistent -- Extreme swelling or bruising -- Continued bleeding from incision -- Increased pain, redness, or drainage from the incision -- Difficulty swallowing or breathing -- Muscle cramping or spasms -- Numbness or tingling in hands or around lips  The clinic staff is available to answer your questions during regular business hours.  Please don't hesitate to call and ask to speak to one of the nurses if you have concerns.  Kabria Hetzer, MD [facility] Office: [phone] 

## 2019-09-20 NOTE — Anesthesia Postprocedure Evaluation (Signed)
Anesthesia Post Note  Patient: Madison Hopkins  Procedure(s) Performed: TOTAL THYROIDECTOMY (N/A Neck)     Patient location during evaluation: PACU Anesthesia Type: General Level of consciousness: awake and alert Pain management: pain level controlled Vital Signs Assessment: post-procedure vital signs reviewed and stable Respiratory status: spontaneous breathing, nonlabored ventilation and respiratory function stable Cardiovascular status: blood pressure returned to baseline and stable Postop Assessment: no apparent nausea or vomiting Anesthetic complications: no   No complications documented.  Last Vitals:  Vitals:   09/20/19 1405 09/20/19 1457  BP: (!) 145/94 (!) 144/80  Pulse: 85 77  Resp: 18 18  Temp: 36.7 C 36.7 C  SpO2: 98% 99%    Last Pain:  Vitals:   09/20/19 1457  TempSrc: Oral  PainSc:                  Lidia Collum

## 2019-09-20 NOTE — Op Note (Signed)
Procedure Note  Pre-operative Diagnosis:  Thyroid neoplasm of uncertain behavior, multiple thyroid nodules  Post-operative Diagnosis:  same  Surgeon:  [name], MD  Assistant:  none   Procedure:  Total thyroidectomy  Anesthesia:  General  Estimated Blood Loss:  minimal  Drains: none         Specimen: thyroid to pathology  Indications:  Patient is referred by Dr. Gunnar Bulla [name] for surgical evaluation and management of newly diagnosed left thyroid nodule. Patient has a significant past medical history and surgical history for breast cancer. She has been managed by my partner, Dr. [name]. Dr. Dalbert Batman is now retired. Patient had undergone evaluation for neck pain including an MRI scan which was performed on May 04, 2019. Incidental finding was made of a left thyroid nodule which had been present on her previous study in 2018. In the interim, this nodule had shown enlargement from 1.8 cm to 2.3 cm. FNA biopsy demonstrated atypia, Bethesda category V, suspicious for malignancy.  Patient now comes to surgery for thyroidectomy.  Procedure Details: Procedure was done in OR #1 at the [facility]. The patient was brought to the operating room and placed in a supine position on the operating room table. Following administration of general anesthesia, the patient was positioned and then prepped and draped in the usual aseptic fashion. After ascertaining that an adequate level of anesthesia had been achieved, a small Kocher incision was made with #15 blade. Dissection was carried through subcutaneous tissues and platysma.Hemostasis was achieved with the electrocautery. Skin flaps were elevated cephalad and caudad from the thyroid notch to the sternal notch. A [place]orner self-retaining retractor was placed for exposure. Strap muscles were incised in the midline and dissection was begun on the left side.  Strap muscles were reflected laterally.  Left thyroid lobe was markedly enlarged  and nodular.  The left lobe was gently mobilized with blunt dissection. Superior pole vessels were dissected out and divided individually between small and medium ligaclips with the harmonic scalpel. The thyroid lobe was rolled anteriorly. Branches of the inferior thyroid artery were divided between small ligaclips with the harmonic scalpel. Inferior venous tributaries were divided between ligaclips. Both the superior and inferior parathyroid glands were identified and preserved on their vascular pedicles. The recurrent laryngeal nerve was identified and preserved along its course. The ligament of Gwenlyn Found was released with the electrocautery and the gland was mobilized onto the anterior trachea. Isthmus was mobilized across the midline. There was a large pyramidal lobe present which was dissected off of the thyroid cartilage and resected with the isthmus. Dry pack was placed in the left neck.  The right thyroid lobe was gently mobilized with blunt dissection. Right thyroid lobe was mildly enlarged and nodular. Superior pole vessels were dissected out and divided between small and medium ligaclips with the Harmonic scalpel. Superior parathyroid was identified and preserved. Inferior venous tributaries were divided between medium ligaclips with the harmonic scalpel. The right thyroid lobe was rolled anteriorly and the branches of the inferior thyroid artery divided between small ligaclips. The right recurrent laryngeal nerve was identified and preserved along its course. The ligament of Gwenlyn Found was released with the electrocautery. The right thyroid lobe was mobilized onto the anterior trachea and the remainder of the thyroid was dissected off the anterior trachea and the thyroid was completely excised. A suture was used to mark the left lobe. The entire thyroid gland was submitted to pathology for review.  The neck was irrigated with warm saline.  Fibrillar was placed throughout the operative field. Strap muscles were  approximated in the midline with interrupted 3-0 Vicryl sutures. Platysma was closed with interrupted 3-0 Vicryl sutures. Skin was closed with a running 4-0 Monocryl subcuticular suture. Wound was washed and Dermabond was applied. The patient was awakened from anesthesia and brought to the recovery room. The patient tolerated the procedure well.   [name], MD [facility] Office: [phone]

## 2019-09-20 NOTE — Transfer of Care (Signed)
Immediate Anesthesia Transfer of Care Note  Patient: Madison Hopkins  Procedure(s) Performed: Procedure(s): TOTAL THYROIDECTOMY (N/A)  Patient Location: PACU  Anesthesia Type:General  Level of Consciousness:  sedated, patient cooperative and responds to stimulation  Airway & Oxygen Therapy:Patient Spontanous Breathing and Patient connected to face mask oxgen  Post-op Assessment:  Report given to PACU RN and Post -op Vital signs reviewed and stable  Post vital signs:  Reviewed and stable  Last Vitals:  Vitals:   09/20/19 0540  BP: (!) 162/88  Pulse: 68  Resp: 16  Temp: 36.7 C  SpO2: 233%    Complications: No apparent anesthesia complications

## 2019-09-20 NOTE — Anesthesia Procedure Notes (Signed)
Procedure Name: Intubation Date/Time: 09/20/2019 7:37 AM Performed by: Anne Fu, CRNA Pre-anesthesia Checklist: Patient identified, Emergency Drugs available, Suction available, Patient being monitored and Timeout performed Patient Re-evaluated:Patient Re-evaluated prior to induction Oxygen Delivery Method: Circle system utilized Preoxygenation: Pre-oxygenation with 100% oxygen Induction Type: IV induction Ventilation: Mask ventilation without difficulty Laryngoscope Size: Mac and 4 Tube type: Oral Tube size: 7.5 mm Number of attempts: 1 Airway Equipment and Method: Stylet Placement Confirmation: ETT inserted through vocal cords under direct vision,  positive ETCO2 and breath sounds checked- equal and bilateral Secured at: 21 cm Tube secured with: Tape Dental Injury: Teeth and Oropharynx as per pre-operative assessment

## 2019-09-21 ENCOUNTER — Encounter (HOSPITAL_COMMUNITY): Payer: Self-pay | Admitting: Surgery

## 2019-09-21 DIAGNOSIS — C73 Malignant neoplasm of thyroid gland: Secondary | ICD-10-CM | POA: Diagnosis not present

## 2019-09-21 LAB — BASIC METABOLIC PANEL
Anion gap: 11 (ref 5–15)
BUN: 12 mg/dL (ref 8–23)
CO2: 27 mmol/L (ref 22–32)
Calcium: 10.5 mg/dL — ABNORMAL HIGH (ref 8.9–10.3)
Chloride: 101 mmol/L (ref 98–111)
Creatinine, Ser: 0.83 mg/dL (ref 0.44–1.00)
GFR calc Af Amer: >60 mL/min (ref >60)
GFR calc non Af Amer: >60 mL/min (ref >60)
Glucose, Bld: 143 mg/dL — ABNORMAL HIGH (ref 70–99)
Potassium: 3.7 mmol/L (ref 3.5–5.1)
Sodium: 139 mmol/L (ref 135–145)

## 2019-09-21 MED ORDER — HYDROCODONE-ACETAMINOPHEN 5-325 MG PO TABS: 1.0000 | ORAL_TABLET | Freq: Four times a day (QID) | ORAL | 0 refills | Status: DC | PRN

## 2019-09-21 MED ORDER — HYDROCODONE-ACETAMINOPHEN 5-325 MG PO TABS: 1.0000 | ORAL_TABLET | ORAL | Status: DC | PRN

## 2019-09-21 NOTE — Progress Notes (Signed)
1 Day Post-Op   Subjective/Chief Complaint: Complained of pain overnight. Seems better this am   Objective: Vital signs in last 24 hours: Temp:  [97.8 F (36.6 C)-98.8 F (37.1 C)] 98.3 F (36.8 C) (07/24 0646) Pulse Rate:  [71-90] 71 (07/24 0646) Resp:  [9-18] 18 (07/24 0646) BP: (140-188)/(76-101) 150/80 (07/24 0646) SpO2:  [92 %-100 %] 100 % (07/24 0646) Last BM Date: 09/20/19  Intake/Output from previous day: 07/23 0701 - 07/24 0700 In: 2333.8 [P.O.:600; I.V.:1533.8; IV Piggyback:200] Out: 7782 [Urine:3075; Blood:30] Intake/Output this shift: No intake/output data recorded.  General appearance: alert and cooperative Neck: soft, small amount of bruising at incision. voice strong Resp: clear to auscultation bilaterally Cardio: regular rate and rhythm GI: soft, non-tender; bowel sounds normal; no masses,  no organomegaly  Lab Results:  No results for input(s): WBC, HGB, HCT, PLT in the last 72 hours. BMET Recent Labs    09/21/19 0627  NA 139  K 3.7  CL 101  CO2 27  GLUCOSE 143*  BUN 12  CREATININE 0.83  CALCIUM 10.5*   PT/INR No results for input(s): LABPROT, INR in the last 72 hours. ABG No results for input(s): PHART, HCO3 in the last 72 hours.  Invalid input(s): PCO2, PO2  Studies/Results: No results found.  Anti-infectives: Anti-infectives (From admission, onward)   Start     Dose/Rate Route Frequency Ordered Stop   09/20/19 0600  ciprofloxacin (CIPRO) IVPB 400 mg        400 mg 200 mL/hr over 60 Minutes Intravenous On call to O.R. 09/20/19 0525 09/20/19 0803      Assessment/Plan: s/p Procedure(s): TOTAL THYROIDECTOMY (N/A) Advance diet  Switch pain meds If she tolerates breakfast then plan for d/c home Ca stable  LOS: 0 days    Madison Hopkins 09/21/2019

## 2019-09-21 NOTE — Progress Notes (Signed)
Assessment unchanged. Pt verbalized Understanding of dc instructions through teach back. Understands medications to resume, follow up care and when to call doctor. Discharged via foot per request to front entrance, accompanied by NT.

## 2019-09-23 LAB — SURGICAL PATHOLOGY

## 2019-10-02 ENCOUNTER — Other Ambulatory Visit: Payer: Self-pay

## 2019-10-02 ENCOUNTER — Ambulatory Visit (INDEPENDENT_AMBULATORY_CARE_PROVIDER_SITE_OTHER): Payer: 59 | Admitting: Internal Medicine

## 2019-10-02 ENCOUNTER — Encounter: Payer: Self-pay | Admitting: Internal Medicine

## 2019-10-02 VITALS — BP 142/80 | HR 71 | Ht 69.0 in | Wt 204.2 lb

## 2019-10-02 DIAGNOSIS — H6592 Unspecified nonsuppurative otitis media, left ear: Secondary | ICD-10-CM | POA: Diagnosis not present

## 2019-10-02 DIAGNOSIS — E890 Postprocedural hypothyroidism: Secondary | ICD-10-CM | POA: Insufficient documentation

## 2019-10-02 DIAGNOSIS — C73 Malignant neoplasm of thyroid gland: Secondary | ICD-10-CM | POA: Diagnosis not present

## 2019-10-02 MED ORDER — FLUTICASONE PROPIONATE 50 MCG/ACT NA SUSP: 1.0000 | Freq: Every day | NASAL | 0 refills | Status: DC | PRN

## 2019-10-02 NOTE — Progress Notes (Signed)
Name: Madison Hopkins  MRN/ DOB: [id number], 05/08/1955    Age/ Sex: 64 y.o., female    PCP: Forrest Moron, MD   Reason for Endocrinology Evaluation: Thyroid nodule      Date of Initial Endocrinology Evaluation: 10/02/2019     HPI: [name] is a 64 y.o. female with a past medical history of Breast ca . The patient presented for initial endocrinology clinic visit on 10/02/2019 for consultative assistance with her left thyroid nodule .   Pt has been referred by Dr. [name].  She had an incidental finding of a left thyroid nodule ~1.5 cm in diameter on neck MRI during evaluation of chronic neck pain.  This nodule was noted to enlarge to 2.3 cm max diameter on cervical spine MRI 04/2019.  She is S/P FNA in 06/2019 - with left mid pole cytology report of Suspicious for malignancy (Bethesda category V), no afirma found for this in the chart. And a left inferior pole cytology of Atypia of undetermined significance (Bethesda category III) , with benign afirma   She is S/P Total thyroidectomy  On 7/23rd/2021 with a 1.3 cm papillary thyroid carcinoma on the left with no invasion, free margins and no L.N submitted.    Today she is c/o left ear pain, that radiates down the left side of the neck, no fever  Has hoarseness  Has normal bowel movement.     HISTORY:  Past Medical History:  Past Medical History:  Diagnosis Date  . Abnormal Pap smear of vagina 07/28/2016   LGSIL; colpo 07/2016 atrophic squamous cells; colpo 07/2017 - atypia  . Allergy   . Breast cancer ([place])    Metastatic Left breast  . Elevated hemoglobin A1c 07/28/2016   level - 6.1  . Endometriosis   . GERD (gastroesophageal reflux disease)   . HSV-2 infection   . Iron deficiency anemia   . Low vitamin D level 07/28/2016   level 24.7  . Personal history of chemotherapy    finished 10/19  . Personal history of radiation therapy    finished 03/2018  . Thyroid cancer ([place])   . Thyroid neoplasm    Past  Surgical History:  Past Surgical History:  Procedure Laterality Date  . ABDOMINAL HYSTERECTOMY    . BREAST BIOPSY Left 08/09/2017  . BREAST LUMPECTOMY Left 01/22/2018  . BREAST LUMPECTOMY WITH RADIOACTIVE SEED AND AXILLARY LYMPH NODE DISSECTION Left 01/22/2018   Procedure: LEFT BREAST LUMPECTOMY WITH RADIOACTIVE SEED AND COMPLETE LEFT AXILLARY LYMPH NODE DISSECTION;  Surgeon: [name], MD;  Location: [place];  Service: General;  Laterality: Left;  . CESAREAN SECTION    . COLON SURGERY    . COLOSTOMY    . COLOSTOMY REVERSAL    . FOOT SURGERY Right    done spur  . OOPHORECTOMY    . PORT-A-CATH REMOVAL Right 08/28/2018   Procedure: REMOVAL PORT-A-CATH;  Surgeon: [name], MD;  Location: [place];  Service: General;  Laterality: Right;  . PORTACATH PLACEMENT N/A 09/06/2017   Procedure: INSERTION PORT-A-CATH;  Surgeon: [name], MD;  Location: [place];  Service: General;  Laterality: N/A;  . SMALL INTESTINE SURGERY    . SPINE SURGERY     Injections  . THYROIDECTOMY N/A 09/20/2019   Procedure: TOTAL THYROIDECTOMY;  Surgeon: [name], MD;  Location: [facility];  Service: General;  Laterality: N/A;      Social History:  reports that she has never smoked. She  has never used smokeless tobacco. She reports current alcohol use. She reports current drug use. Drug: Marijuana.  Family History: family history includes Alcoholism in her mother; Cancer in her father; Cirrhosis in her mother; Lung cancer in her father; Mental illness in her mother.   HOME MEDICATIONS: Allergies as of 10/02/2019      Reactions   Bee Venom    Tramadol Nausea Only   Penicillins Nausea Only   Has patient had a PCN reaction causing immediate rash, facial/tongue/throat swelling, SOB or lightheadedness with hypotension: No Has patient had a PCN reaction causing severe rash involving mucus membranes or skin necrosis: No Has patient had a PCN reaction that required  hospitalization: No Has patient had a PCN reaction occurring within the last 10 years: Yes--nausea & headache ONLY If all of the above answers are "NO", then may proceed with Cephalosporin use.      Medication List       Accurate as of October 02, 2019 11:59 PM. If you have any questions, ask your nurse or doctor.        STOP taking these medications   fluconazole 100 MG tablet Commonly known as: DIFLUCAN Stopped by: Dorita Sciara, MD     TAKE these medications   acyclovir ointment 5 % Commonly known as: ZOVIRAX Apply 1 application topically every 4 (four) hours.   ALIGN PO Take by mouth.   anastrozole 1 MG tablet Commonly known as: ARIMIDEX TAKE 1 TABLET BY MOUTH EVERY DAY   BORIC ACID EX Apply topically.   calcium carbonate 500 MG chewable tablet Commonly known as: Tums Chew 2 tablets (400 mg of elemental calcium total) by mouth 3 (three) times daily.   ciprofloxacin-dexamethasone OTIC suspension Commonly known as: CIPRODEX Place 4 drops into the left ear 2 (two) times daily.   fluticasone 50 MCG/ACT nasal spray Commonly known as: Flonase Place 1 spray into both nostrils daily as needed for allergies.   HYDROcodone-acetaminophen 5-325 MG tablet Commonly known as: NORCO/VICODIN Take 1-2 tablets by mouth every 6 (six) hours as needed for moderate pain.   levothyroxine 100 MCG tablet Commonly known as: Synthroid Take 1 tablet (100 mcg total) by mouth daily.   multivitamin with minerals Tabs tablet Take 1 tablet by mouth daily after lunch.   naproxen sodium 220 MG tablet Commonly known as: ALEVE Take 220 mg by mouth 2 (two) times daily as needed (FOR PAIN.).   OPTI-TEARS OP Place 1 drop into both eyes 3 (three) times daily as needed (for dry/irritated eyes.).   OSTEO BI-FLEX ADV TRIPLE ST PO Take 1 tablet by mouth daily after lunch.   traMADol 50 MG tablet Commonly known as: ULTRAM Take 1-2 tablets (50-100 mg total) by mouth every 6 (six) hours  as needed.   TURMERIC PO Take 1 tablet by mouth daily.   Visine 0.05 % ophthalmic solution Generic drug: tetrahydrozoline Place 1 drop into both eyes 3 (three) times daily as needed (for dry eyes.).   Vitamin C 100 MG Chew Chew 100 mg by mouth daily.   VITAMIN D3 COMPLETE PO Take 1 tablet by mouth daily.         REVIEW OF SYSTEMS: A comprehensive ROS was conducted with the patient and is negative except as per HPI     OBJECTIVE:  VS: BP (!) 142/80 (BP Location: Right Arm, Patient Position: Sitting, Cuff Size: Normal)   Pulse 71   Ht 5\' 9"  (1.753 m)   Wt 204 lb 3.2 oz (92.6  kg)   LMP  (LMP Unknown)   SpO2 97%   BMI 30.16 kg/m    Wt Readings from Last 3 Encounters:  10/02/19 204 lb 3.2 oz (92.6 kg)  09/20/19 (!) 209 lb 9.6 oz (95.1 kg)  09/10/19 209 lb 9.6 oz (95.1 kg)     EXAM: General: Pt appears well and is in NAD  Head: Right ear exam norma, left middle ear clear effusion noted   Neck: General: Supple without adenopathy. Thyroid:  No nodules appreciated. Incision clear with mild edema  Lungs: Clear with good BS bilat with no rales, rhonchi, or wheezes  Heart: Auscultation: RRR.  Abdomen: Normoactive bowel sounds, soft, nontender, without masses or organomegaly palpable  Extremities:  BL LE: No pretibial edema normal ROM and strength.  Skin: Hair: Texture and amount normal with gender appropriate distribution Skin Inspection: No rashes Skin Palpation: Skin temperature, texture, and thickness normal to palpation  Neuro: Cranial nerves: II - XII grossly intact  Motor: Normal strength throughout DTRs: 2+ and symmetric in UE without delay in relaxation phase  Mental Status: Judgment, insight: Intact Orientation: Oriented to time, place, and person Mood and affect: No depression, anxiety, or agitation     DATA REVIEWED: Results for MAISY, [place] (MRN [id number]) as of 10/04/2019 07:50  Ref. Range 10/02/2019 14:40  TSH Latest Ref Range: 0.35 - 4.50 uIU/mL  1.69  T4,Free(Direct) Latest Ref Range: 0.60 - 1.60 ng/dL 0.91  Thyroglobulin Latest Units: ng/mL 6.6  Thyroglobulin Ab Latest Ref Range: < or = 1 IU/mL <1       06/12/2019 TSH 0.66    FNA Left mid pole 1.3 x0.9x0.7 cm (07/16/2019)  FINAL MICROSCOPIC DIAGNOSIS:  - Suspicious for malignancy (Bethesda category V)   No Afirma   FNA of the left inferior 3.2 x 2.9 x 2.3 cm ( 07/16/2019)    FINAL MICROSCOPIC DIAGNOSIS:  - Atypia of undetermined significance (Bethesda category III)   Afirma - Benign   Pathology report 09/20/2019 THYROID, TOTAL THYROIDECTOMY:  - Papillary thyroid carcinoma, 1.3 cm.  - Margins not involved.  - Multiple adenomatous nodules.  - See oncology table.   ONCOLOGY TABLE:  THYROID GLAND:  Procedure: Total thyroidectomy.  Tumor Focality: Unifocal.  Tumor Site: Left lobe, mid.  Tumor Size: 1.3 x 1 x 1 cm.  Histologic Type: Papillary thyroid carcinoma.  Margins: Free of tumor.  Angioinvasion: Not identified.  Lymphatic Invasion: Not identified.  Extrathyroidal extension: Not identified.  Regional Lymph Nodes: No lymph nodes submitted.  Pathologic Stage Classification (pTNM, AJCC 8th Edition): pT1b, pNX  Representative Tumor Block: A6 and A7.  Comment(s): There is a 1.3 cm in greatest dimension papillary carcinoma  in the mid left lobe (designated nodule #2). There are multiple  additional adenomatous nodules in the left lobe and right lobe.     ASSESSMENT/PLAN/RECOMMENDATIONS:   1. Hx of papillary Thyroid carcinoma (pT1b, pNX) Stage I:   - S/P total thyroidectomy 09/20/2019 - Indeterminate response to treatment this early post-op  - She is at low risk for recurrent based on pathology report  - No indication for RAI ablation at this time - Tg in normal range, will recheck in 8 weeks  - Tg ab's undetectable  - TSH goal 0.1-0.5 uIU/mL for now , hence will increase levothyroxine  - Will order a thyroid bed ultrasound in 5 months    2.  Post-operative Hypothyroidism    - She is clinically euthyroid  - Pt educated extensively on the correct way to  take levothyroxine (first thing in the morning with water, 30 minutes before eating or taking other medications). - Pt encouraged to double dose the following day if she were to miss a dose given long half-life of levothyroxine.    Medications  - Increase Levothyroxine 100 mcg to 2 tablets on Sundays and 1 tablet rest of the week    3. Left middle ear effusion :  - Clear effusion, pt to use OTC antihistamine and Flonase.   F/U in 5 months   Signed electronically by: Mack Guise, MD  [facility]  [address] [place], [place] [address] Phone: [phone] FAX: [phone]   CC: Forrest Moron, MD [phone] [place] [address] Phone: [phone] Fax: [phone]   Return to Endocrinology clinic as below: Future Appointments  Date Time Provider [place]  10/30/2019  3:30 PM Nunzio Cobbs, MD [place] None  11/27/2019  7:15 AM [facility]  11/28/2019  3:00 PM [facility] DIAG TOMO 1 [facility] GI-BREAST CE  11/28/2019  3:30 PM [facility] DX DEXA 1 [facility] GI-BREAST CE  12/12/2019  2:30 PM [facility] LAB 2 [facility] None  12/12/2019  3:00 PM [name], [name] Dad, MD [facility] None  04/07/2020  3:40 PM Dequincy Born, [name], MD [facility] [place]

## 2019-10-02 NOTE — Patient Instructions (Signed)

## 2019-10-03 LAB — THYROGLOBULIN LEVEL: Thyroglobulin: 6.6 ng/mL

## 2019-10-03 LAB — THYROGLOBULIN ANTIBODY: Thyroglobulin Ab: <1 IU/mL (ref <=1)

## 2019-10-03 LAB — T4, FREE: Free T4: 0.91 ng/dL (ref 0.60–1.60)

## 2019-10-03 LAB — TSH: TSH: 1.69 u[IU]/mL (ref 0.35–4.50)

## 2019-10-04 MED ORDER — LEVOTHYROXINE SODIUM 100 MCG PO TABS: 100.0000 ug | ORAL_TABLET | ORAL | 6 refills | Status: DC

## 2019-10-27 ENCOUNTER — Other Ambulatory Visit: Payer: Self-pay | Admitting: Internal Medicine

## 2019-10-28 NOTE — Telephone Encounter (Signed)
Do you want to refill this ? 

## 2019-10-30 ENCOUNTER — Ambulatory Visit: Payer: 59 | Admitting: Obstetrics and Gynecology

## 2019-10-30 ENCOUNTER — Encounter: Payer: Self-pay | Admitting: Obstetrics and Gynecology

## 2019-10-30 ENCOUNTER — Other Ambulatory Visit: Payer: Self-pay

## 2019-10-30 VITALS — BP 140/68 | HR 70 | Resp 16 | Ht 68.0 in | Wt 206.4 lb

## 2019-10-30 DIAGNOSIS — Z01419 Encounter for gynecological examination (general) (routine) without abnormal findings: Secondary | ICD-10-CM

## 2019-10-30 NOTE — Patient Instructions (Signed)

## 2019-10-30 NOTE — Progress Notes (Signed)
64 y.o. G10P0002 Single African American female here for annual exam.    Just returned back to work last week after having thyroid surgery for thyroid cancer.   Retiring next year.   Received her Covid vaccine, [place], finished 07/22/19. Had Covid in December 2020. She experienced a lot of fatigue.  PCP: Delia Chimes, MD   No LMP recorded (lmp unknown). Patient has had a hysterectomy.           Sexually active: No.  The current method of family planning is status post hysterectomy.    Exercising: No.  The patient does not participate in regular exercise at present.--occ. walking Smoker:  no  Health Maintenance: Pap: 10-23-18 Neg:Neg HR HPV, 08-10-17 ASCUS:Neg HR HPV  History of abnormal Pap:  Yes, 08-17-17 colpo showing mild atypia of vagina; 08-10-17 ASCUS:Neg HR HPV.  7/13/18colpo biopsy showing atrophic squamous cells (menopausal change); 07/28/16 LGSIL MMG: 11-02-18 Diag.Bil.w/Rt.Br.US/Hx Lt.Br.CA/Lumpectomy changes on the left.  Benign cyst right breast/Diag.suggested 69yr./density B/BiRads2.  Scheduled for 11/28/19.  Colonoscopy:  Years ago per patient normal.   BMD:  08-29-16  Result :Normal.. Scheduled for 11/28/19.  TDaP: 10-23-18 Gardasil:   no HIV:08-15-16 Neg Hep C:08-15-16 Neg Screening Labs:  PCP   reports that she has never smoked. She has never used smokeless tobacco. She reports current alcohol use. She reports current drug use. Drug: Marijuana.  Past Medical History:  Diagnosis Date  . Abnormal Pap smear of vagina 07/28/2016   LGSIL; colpo 07/2016 atrophic squamous cells; colpo 07/2017 - atypia  . Allergy   . Breast cancer ([place])    Metastatic Left breast  . Elevated hemoglobin A1c 07/28/2016   level - 6.1  . Endometriosis   . GERD (gastroesophageal reflux disease)   . HSV-2 infection   . Iron deficiency anemia   . Low vitamin D level 07/28/2016   level 24.7  . Personal history of chemotherapy    finished 10/19  . Personal history of radiation therapy    finished  03/2018  . Thyroid cancer ([place])   . Thyroid neoplasm     Past Surgical History:  Procedure Laterality Date  . ABDOMINAL HYSTERECTOMY    . BREAST BIOPSY Left 08/09/2017  . BREAST LUMPECTOMY Left 01/22/2018  . BREAST LUMPECTOMY WITH RADIOACTIVE SEED AND AXILLARY LYMPH NODE DISSECTION Left 01/22/2018   Procedure: LEFT BREAST LUMPECTOMY WITH RADIOACTIVE SEED AND COMPLETE LEFT AXILLARY LYMPH NODE DISSECTION;  Surgeon: [name], MD;  Location: [facility];  Service: General;  Laterality: Left;  . CESAREAN SECTION    . COLON SURGERY    . COLOSTOMY    . COLOSTOMY REVERSAL    . FOOT SURGERY Right    done spur  . OOPHORECTOMY    . PORT-A-CATH REMOVAL Right 08/28/2018   Procedure: REMOVAL PORT-A-CATH;  Surgeon: [name], MD;  Location: [facility];  Service: General;  Laterality: Right;  . PORTACATH PLACEMENT N/A 09/06/2017   Procedure: INSERTION PORT-A-CATH;  Surgeon: [name], MD;  Location: [facility];  Service: General;  Laterality: N/A;  . SMALL INTESTINE SURGERY    . SPINE SURGERY     Injections  . THYROIDECTOMY N/A 09/20/2019   Procedure: TOTAL THYROIDECTOMY;  Surgeon: [name], MD;  Location: [facility];  Service: General;  Laterality: N/A;    Current Outpatient Medications  Medication Sig Dispense Refill  . acyclovir ointment (ZOVIRAX) 5 % Apply 1 application topically every 4 (four) hours. 15 g 2  . anastrozole (ARIMIDEX) 1 MG tablet  TAKE 1 TABLET BY MOUTH EVERY DAY 30 tablet 14  . Artificial Tear Solution (OPTI-TEARS OP) Place 1 drop into both eyes 3 (three) times daily as needed (for dry/irritated eyes.).    Marland Kitchen Ascorbic Acid (VITAMIN C) 100 MG CHEW Chew 100 mg by mouth daily.     Marland Kitchen BORIC ACID EX Apply topically.     . fluticasone (FLONASE) 50 MCG/ACT nasal spray Place 1 spray into both nostrils daily as needed for allergies. 11.1 mL 0  . levothyroxine (SYNTHROID) 100 MCG tablet Take 1 tablet (100 mcg total) by mouth as directed. 34 tablet 6   . Misc Natural Products (OSTEO BI-FLEX ADV TRIPLE ST PO) Take 1 tablet by mouth daily after lunch.     . Multiple Vitamin (MULTIVITAMIN WITH MINERALS) TABS tablet Take 1 tablet by mouth daily after lunch.    . Multiple Vitamins-Minerals (VITAMIN D3 COMPLETE PO) Take 1 tablet by mouth daily.     . naproxen sodium (ALEVE) 220 MG tablet Take 220 mg by mouth 2 (two) times daily as needed (FOR PAIN.).     Marland Kitchen Probiotic Product (ALIGN PO) Take by mouth.     . tetrahydrozoline (VISINE) 0.05 % ophthalmic solution Place 1 drop into both eyes 3 (three) times daily as needed (for dry eyes.).      No current facility-administered medications for this visit.    Family History  Problem Relation Age of Onset  . Mental illness Mother   . Alcoholism Mother   . Cirrhosis Mother   . Cancer Father   . Lung cancer Father     Review of Systems  All other systems reviewed and are negative.   Exam:   BP 140/68 (Cuff Size: Large)   Pulse 70   Resp 16   Ht 5\' 8"  (1.727 m)   Wt 206 lb 6.4 oz (93.6 kg)   LMP  (LMP Unknown)   BMI 31.38 kg/m     General appearance: alert, cooperative and appears stated age Head: normocephalic, without obvious abnormality, atraumatic Neck: no adenopathy, supple, symmetrical, trachea midline and thyroid absent. Lungs: clear to auscultation bilaterally Breasts: right - normal appearance, no masses or tenderness, No nipple retraction or dimpling, No nipple discharge or bleeding, No axillary adenopathy Left - mild retraction of nipple, radiation changes, scar. Heart: regular rate and rhythm Abdomen: soft, non-tender; no masses, no organomegaly Extremities: extremities normal, atraumatic, no cyanosis or edema Skin: raised dark nontender lesions under left axilla.  Lymph nodes: cervical, supraclavicular, and axillary nodes normal. Neurologic: grossly normal  Pelvic: External genitalia:  no lesions              No abnormal inguinal nodes palpated.              Urethra:   normal appearing urethra with no masses, tenderness or lesions              Bartholins and Skenes: normal                 Vagina: normal appearing vagina with normal color and discharge, no lesions              Cervix:absent              Pap taken: Yes.   Bimanual Exam:  Uterus:  absent              Adnexa: no mass, fullness, tenderness              Rectal exam:  Yes.  .  Confirms.              Anus:  normal sphincter tone, no lesions  Chaperone was present for exam.  Assessment:   Well woman visit with normal exam. Status post TAH/BSO/appy.  Hx VAIN I. Hx metastatic left breast cancer.  On Arimidex.  Hx HSV II. Elevated A1C.  Low vit D.  Thyroid cancer.  Status post thyroidectomy.   Plan: Mammogram screening discussed. Self breast awareness reviewed. Pap and HR HPV as above. Guidelines for Calcium, Vitamin D, regular exercise program including cardiovascular and weight bearing exercise. She will follow up with oncology.  Follow up annually and prn.

## 2019-10-31 ENCOUNTER — Other Ambulatory Visit (HOSPITAL_COMMUNITY)
Admission: RE | Admit: 2019-10-31 | Discharge: 2019-10-31 | Disposition: A | Discharge status: Home / Self Care | Payer: 59 | Source: Ambulatory Visit | Attending: Obstetrics and Gynecology | Admitting: Obstetrics and Gynecology

## 2019-10-31 DIAGNOSIS — Z01419 Encounter for gynecological examination (general) (routine) without abnormal findings: Secondary | ICD-10-CM | POA: Diagnosis not present

## 2019-11-05 LAB — CYTOLOGY - PAP
Comment: NEGATIVE
Diagnosis: REACTIVE
High risk HPV: NEGATIVE

## 2019-11-26 NOTE — Addendum Note (Signed)
Addended by: Kelle Darting A on: 11/26/2019 09:51 AM   Modules accepted: Orders

## 2019-11-27 ENCOUNTER — Other Ambulatory Visit (INDEPENDENT_AMBULATORY_CARE_PROVIDER_SITE_OTHER): Payer: 59

## 2019-11-27 ENCOUNTER — Other Ambulatory Visit: Payer: Self-pay

## 2019-11-27 DIAGNOSIS — C73 Malignant neoplasm of thyroid gland: Secondary | ICD-10-CM | POA: Diagnosis not present

## 2019-11-28 ENCOUNTER — Ambulatory Visit
Admission: RE | Admit: 2019-11-28 | Discharge: 2019-11-28 | Disposition: A | Discharge status: Home / Self Care | Payer: 59 | Source: Ambulatory Visit | Attending: Oncology | Admitting: Oncology

## 2019-11-28 ENCOUNTER — Other Ambulatory Visit: Payer: Self-pay | Admitting: Oncology

## 2019-11-28 ENCOUNTER — Encounter: Payer: Self-pay | Admitting: Internal Medicine

## 2019-11-28 DIAGNOSIS — Z170 Estrogen receptor positive status [ER+]: Secondary | ICD-10-CM

## 2019-11-28 DIAGNOSIS — R928 Other abnormal and inconclusive findings on diagnostic imaging of breast: Secondary | ICD-10-CM

## 2019-11-28 DIAGNOSIS — C50412 Malignant neoplasm of upper-outer quadrant of left female breast: Secondary | ICD-10-CM

## 2019-11-28 LAB — THYROGLOBULIN LEVEL: Thyroglobulin: 0.1 ng/mL — ABNORMAL LOW

## 2019-11-28 LAB — THYROGLOBULIN ANTIBODY: Thyroglobulin Ab: <1 IU/mL (ref <=1)

## 2019-11-28 LAB — TSH: TSH: 1.97 m[IU]/L (ref 0.40–4.50)

## 2019-11-28 MED ORDER — LEVOTHYROXINE SODIUM 125 MCG PO TABS
125.0000 ug | ORAL_TABLET | Freq: Every day | ORAL | 3 refills | Status: DC
Start: 2019-11-28 — End: 2020-10-14

## 2019-11-30 ENCOUNTER — Ambulatory Visit
Admission: RE | Admit: 2019-11-30 | Discharge: 2019-11-30 | Disposition: A | Discharge status: Home / Self Care | Payer: 59 | Source: Ambulatory Visit | Attending: Oncology | Admitting: Oncology

## 2019-11-30 ENCOUNTER — Other Ambulatory Visit: Payer: Self-pay

## 2019-11-30 DIAGNOSIS — C50412 Malignant neoplasm of upper-outer quadrant of left female breast: Secondary | ICD-10-CM

## 2019-11-30 MED ORDER — GADOBUTROL 1 MMOL/ML IV SOLN
8.0000 mL | Freq: Once | INTRAVENOUS | Status: AC | PRN
  Administered 2019-11-30: 8 mL via INTRAVENOUS

## 2019-12-12 ENCOUNTER — Inpatient Hospital Stay: Payer: 59 | Attending: Oncology | Admitting: Oncology

## 2019-12-12 ENCOUNTER — Inpatient Hospital Stay: Payer: 59

## 2019-12-12 ENCOUNTER — Other Ambulatory Visit: Payer: Self-pay

## 2019-12-12 VITALS — BP 141/77 | HR 64 | Temp 97.7°F | Resp 17 | Ht 68.0 in | Wt 204.5 lb

## 2019-12-12 DIAGNOSIS — Z170 Estrogen receptor positive status [ER+]: Secondary | ICD-10-CM

## 2019-12-12 DIAGNOSIS — Z79811 Long term (current) use of aromatase inhibitors: Secondary | ICD-10-CM | POA: Diagnosis not present

## 2019-12-12 DIAGNOSIS — M8580 Other specified disorders of bone density and structure, unspecified site: Secondary | ICD-10-CM | POA: Insufficient documentation

## 2019-12-12 DIAGNOSIS — Z923 Personal history of irradiation: Secondary | ICD-10-CM | POA: Diagnosis not present

## 2019-12-12 DIAGNOSIS — C73 Malignant neoplasm of thyroid gland: Secondary | ICD-10-CM | POA: Diagnosis not present

## 2019-12-12 DIAGNOSIS — C50412 Malignant neoplasm of upper-outer quadrant of left female breast: Secondary | ICD-10-CM

## 2019-12-12 DIAGNOSIS — C773 Secondary and unspecified malignant neoplasm of axilla and upper limb lymph nodes: Secondary | ICD-10-CM | POA: Diagnosis not present

## 2019-12-12 LAB — CBC WITH DIFFERENTIAL/PLATELET
Abs Immature Granulocytes: 0.03 10*3/uL (ref 0.00–0.07)
Basophils Absolute: 0 10*3/uL (ref 0.0–0.1)
Basophils Relative: 0 %
Eosinophils Absolute: 0.1 10*3/uL (ref 0.0–0.5)
Eosinophils Relative: 1 %
HCT: 38.7 % (ref 36.0–46.0)
Hemoglobin: 12.2 g/dL (ref 12.0–15.0)
Immature Granulocytes: 0 %
Lymphocytes Relative: 34 %
Lymphs Abs: 3.1 10*3/uL (ref 0.7–4.0)
MCH: 26 pg (ref 26.0–34.0)
MCHC: 31.5 g/dL (ref 30.0–36.0)
MCV: 82.3 fL (ref 80.0–100.0)
Monocytes Absolute: 0.6 10*3/uL (ref 0.1–1.0)
Monocytes Relative: 7 %
Neutro Abs: 5.3 10*3/uL (ref 1.7–7.7)
Neutrophils Relative %: 58 %
Platelets: 341 10*3/uL (ref 150–400)
RBC: 4.7 MIL/uL (ref 3.87–5.11)
RDW: 13.7 % (ref 11.5–15.5)
WBC: 9.1 10*3/uL (ref 4.0–10.5)
nRBC: 0 % (ref 0.0–0.2)

## 2019-12-12 LAB — COMPREHENSIVE METABOLIC PANEL
ALT: 17 U/L (ref 0–44)
AST: 13 U/L — ABNORMAL LOW (ref 15–41)
Albumin: 4.1 g/dL (ref 3.5–5.0)
Alkaline Phosphatase: 159 U/L — ABNORMAL HIGH (ref 38–126)
Anion gap: 9 (ref 5–15)
BUN: 11 mg/dL (ref 8–23)
CO2: 25 mmol/L (ref 22–32)
Calcium: 10.4 mg/dL — ABNORMAL HIGH (ref 8.9–10.3)
Chloride: 104 mmol/L (ref 98–111)
Creatinine, Ser: 0.96 mg/dL (ref 0.44–1.00)
GFR, Estimated: >60 mL/min (ref >60)
Glucose, Bld: 117 mg/dL — ABNORMAL HIGH (ref 70–99)
Potassium: 3.6 mmol/L (ref 3.5–5.1)
Sodium: 138 mmol/L (ref 135–145)
Total Bilirubin: 0.5 mg/dL (ref 0.3–1.2)
Total Protein: 8 g/dL (ref 6.5–8.1)

## 2019-12-12 NOTE — Progress Notes (Signed)
Madison Hopkins  [phone] [phone]    ID: Madison Hopkins DOB: December 22, 1955  MR#: [id number]  JYN#:[id number]  Patient Care Team: Forrest Moron, MD as PCP - General (Internal Medicine) Dontarius Sheley, [name] Dad, MD as Consulting Physician (Oncology) Kyung Rudd, MD as Consulting Physician (Radiation Oncology) [name], [name] All, MD as Consulting Physician (Obstetrics and Gynecology) Haroldine Laws, [name], MD as Consulting Physician (Cardiology) [name], MD as Attending Physician (Physical Medicine and Rehabilitation) OTHER MD:   CHIEF COMPLAINT: HER-2 positive breast cancer  CURRENT TREATMENT: Anastrozole   INTERVAL HISTORY: Madison Hopkins returns today for follow-up of her HER-2 positive, weakly estrogen receptor positive breast cancer.   She started anastrozole April 2020.  Hot flashes and vaginal dryness are not major issues for her.  Since her last visit, she presented for annual mammography with complaints of pain and fullness of the left axilla with skin changes along the medial left breast. She underwent bilateral diagnostic mammography with tomography and left breast ultrasonography at [facility] on 11/28/2019 showing: breast density category B; indeterminate, focal skin thickening in lower-inner left breast; questioned developing asymmetry in lower-inner quadrant of left breast which does not definitively persist on additional mammographic or sonographic evaluation; no mammographic evidence of malignancy on the right.  She also underwent bone density screening the same day showing a T-score of -1.5, which is considered osteopenic.  She underwent breast MRI on 11/30/2019 to further evaluate the possible asymmetry. This showed: breast composition B; diffuse skin thickening of central to lower left breast concordant with expected findings status post radiation therapy; no area of asymmetrically prominent skin thickening to suggest  malignancy/recurrence; no evidence of malignancy within either breast.  Additionally, she underwent total thyroidectomy on 09/20/2019 under Dr. Harlow Asa. Pathology from the procedure ([id number]) showed: papillary thyroid carcinoma, 1.3 cm; margins not involved; multiple adenomatous nodules.  Of note, she also underwent pap smear on 10/31/2019, which was negative.   REVIEW OF SYSTEMS: Genae is very concerned about a spot in the left upper extremity medially which she says every time she uses deodorant or anything gets very irritated.  She is changing primary care physicians.  She is having her thyroid function monitored closely by Dr. Kelton Pillar and Belkis says she is already feeling much better.  Bruna is not exercising regularly.  She did receive the [place] vaccine x2 without complications.  Detailed review of systems today stable   HISTORY OF CURRENT ILLNESS: From the original intake note:  Madison Hopkins noted a mass in the left axilla sometime in January or February 2019.  She eventually brought her to her gynecologist's attention, and underwent bilateral diagnostic mammography with tomography and left breast ultrasonography at [facility] on 08/04/2017 showing: breast density category B. There is a highly suspicious hypoechoic mass in the left breast at the 2 o'clock upper outer quadrant measuring 2.3 x 1.6 x 2.2 cm, located 2 cm from the nipple. Sonographically, there were 2 enlarged lymph nodes in the left axilla, the largest with cortical thickening measuring 2.5 cm. No evidence of malignancy was seen in the right breast.   Accordingly on 08/07/2017 she proceeded to biopsy of the left breast area and 1 of the lymph nodes in question. The pathology from this procedure showed ([id number]): Invasive ductal carcinoma, grade 3. Metastatic carcinoma was found in one left axillary lymph node. Prognostic indicators significant for: estrogen receptor, 30% positive with weak staining intensity and  progesterone receptor, 0% negative. Proliferation marker Ki67  at 80%. HER2 amplified with ratios HER2/CEP17 signals 2.32 and average HER2 copies per cell 6.60  The patient's subsequent history is as detailed below.   PAST MEDICAL HISTORY: Past Medical History:  Diagnosis Date  . Abnormal Pap smear of vagina 07/28/2016   LGSIL; colpo 07/2016 atrophic squamous cells; colpo 07/2017 - atypia  . Allergy   . Breast cancer ([place])    Metastatic Left breast  . Elevated hemoglobin A1c 07/28/2016   level - 6.1  . Endometriosis   . GERD (gastroesophageal reflux disease)   . HSV-2 infection   . Iron deficiency anemia   . Low vitamin D level 07/28/2016   level 24.7  . Personal history of chemotherapy    finished 10/19  . Personal history of radiation therapy    finished 03/2018  . Thyroid cancer ([facility])   . Thyroid neoplasm   GERD but no ulcers   PAST SURGICAL HISTORY: Past Surgical History:  Procedure Laterality Date  . ABDOMINAL HYSTERECTOMY    . BREAST BIOPSY Left 08/09/2017  . BREAST LUMPECTOMY Left 01/22/2018  . BREAST LUMPECTOMY WITH RADIOACTIVE SEED AND AXILLARY LYMPH NODE DISSECTION Left 01/22/2018   Procedure: LEFT BREAST LUMPECTOMY WITH RADIOACTIVE SEED AND COMPLETE LEFT AXILLARY LYMPH NODE DISSECTION;  Surgeon: [name], MD;  Location: [facility];  Service: General;  Laterality: Left;  . CESAREAN SECTION    . COLON SURGERY    . COLOSTOMY    . COLOSTOMY REVERSAL    . FOOT SURGERY Right    done spur  . OOPHORECTOMY    . PORT-A-CATH REMOVAL Right 08/28/2018   Procedure: REMOVAL PORT-A-CATH;  Surgeon: [name], MD;  Location: [facility];  Service: General;  Laterality: Right;  . PORTACATH PLACEMENT N/A 09/06/2017   Procedure: INSERTION PORT-A-CATH;  Surgeon: [name], MD;  Location: [facility];  Service: General;  Laterality: N/A;  . SMALL INTESTINE SURGERY    . SPINE SURGERY     Injections  . THYROIDECTOMY N/A 09/20/2019   Procedure:  TOTAL THYROIDECTOMY;  Surgeon: [name], MD;  Location: [facility];  Service: General;  Laterality: N/A;  Hysterectomy with salpingo-oophorectomy, Tonsillectomy, Plantar Fascitis Right Foot Surgery    FAMILY HISTORY: Family History  Problem Relation Age of Onset  . Mental illness Mother   . Alcoholism Mother   . Cirrhosis Mother   . Cancer Father   . Lung cancer Father    The patient' father died at age 40 due to lung cancer (heavy smoker). The patient's mother died due to liver cirrhosis (heavy drinker). The patient has 2 brothers and no sisters. There was a paternal 1st cousin with colon cancer diagnosed in the mid 40's, who also had cervical cancer. The mother of this 1st cousin (the patient's paternal aunt) had cancer (the patient's is unsure of what type). There was also a paternal uncle with prostate cancer diagnosed in the 37's. The patient denies a family history of breast or ovarian cancer.     GYNECOLOGIC HISTORY:  No LMP recorded (lmp unknown). Patient has had a hysterectomy. Menarche: 64 years old Age at first live birth: 64 years old She is GXP2.  The patient is status post total hysterectomy with bilateral salpingo-oophorectomy in 1995.  She never used contraception. She notes that she had an estrogen shot one time but no other HRTs.    SOCIAL HISTORY:  The patient works in Therapist, art for the tax department.  She is considering retirement at age 40.  She  is single. At home is herself and no pets. Her son, [name] is age 84 and lives in [place], [place] as a Musician. The patient's daughter [name] is age 64 and lives in [place] in Therapist, art for [employer]. The patient has 5 grandchildren and 1 great grandchild. She attends [employer].   ADVANCED DIRECTIVES: Not in place; at the 08/16/2017 visit the patient was given the appropriate forms to complete on notarized at her discretion   HEALTH MAINTENANCE: Social History   Tobacco Use  . Smoking  status: Never Smoker  . Smokeless tobacco: Never Used  Vaping Use  . Vaping Use: Never used  Substance Use Topics  . Alcohol use: Yes    Alcohol/week: 0.0 standard drinks    Comment: occ.glass of wine per month  . Drug use: Yes    Types: Marijuana    Comment: everyday     Colonoscopy: 2009?  PAP: 10/2019, negative  Bone density: 10/24/2016, -0.7   Allergies  Allergen Reactions  . Bee Venom   . Tramadol Nausea Only  . Penicillins Nausea Only    Has patient had a PCN reaction causing immediate rash, facial/tongue/throat swelling, SOB or lightheadedness with hypotension: No Has patient had a PCN reaction causing severe rash involving mucus membranes or skin necrosis: No Has patient had a PCN reaction that required hospitalization: No Has patient had a PCN reaction occurring within the last 10 years: Yes--nausea & headache ONLY If all of the above answers are "NO", then may proceed with Cephalosporin use.     Current Outpatient Medications  Medication Sig Dispense Refill  . acyclovir ointment (ZOVIRAX) 5 % Apply 1 application topically every 4 (four) hours. 15 g 2  . anastrozole (ARIMIDEX) 1 MG tablet TAKE 1 TABLET BY MOUTH EVERY DAY 30 tablet 14  . Artificial Tear Solution (OPTI-TEARS OP) Place 1 drop into both eyes 3 (three) times daily as needed (for dry/irritated eyes.).    Marland Kitchen Ascorbic Acid (VITAMIN C) 100 MG CHEW Chew 100 mg by mouth daily.     Marland Kitchen BORIC ACID EX Apply topically.     . fluticasone (FLONASE) 50 MCG/ACT nasal spray Place 1 spray into both nostrils daily as needed for allergies. 11.1 mL 0  . levothyroxine (SYNTHROID) 125 MCG tablet Take 1 tablet (125 mcg total) by mouth daily. 90 tablet 3  . Misc Natural Products (OSTEO BI-FLEX ADV TRIPLE ST PO) Take 1 tablet by mouth daily after lunch.     . Multiple Vitamin (MULTIVITAMIN WITH MINERALS) TABS tablet Take 1 tablet by mouth daily after lunch.    . Multiple Vitamins-Minerals (VITAMIN D3 COMPLETE PO) Take 1 tablet by  mouth daily.     . naproxen sodium (ALEVE) 220 MG tablet Take 220 mg by mouth 2 (two) times daily as needed (FOR PAIN.).     Marland Kitchen Probiotic Product (ALIGN PO) Take by mouth.     . tetrahydrozoline (VISINE) 0.05 % ophthalmic solution Place 1 drop into both eyes 3 (three) times daily as needed (for dry eyes.).      No current facility-administered medications for this visit.    OBJECTIVE: African-American woman who appears younger than stated age  49:   12/12/19 1453  BP: (!) 141/77  Pulse: 64  Resp: 17  Temp: 97.7 F (36.5 C)  SpO2: 100%     Body mass index is 31.09 kg/m.   Wt Readings from Last 3 Encounters:  12/12/19 204 lb 8 oz (92.8 kg)  10/30/19  206 lb 6.4 oz (93.6 kg)  10/02/19 204 lb 3.2 oz (92.6 kg)  ECOG FS:1 - Symptomatic but completely ambulatory  Sclerae unicteric, EOMs intact Wearing a mask No cervical or supraclavicular adenopathy Lungs no rales or rhonchi Heart regular rate and rhythm Abd soft, nontender, positive bowel sounds MSK no focal spinal tenderness, no upper extremity lymphedema Neuro: nonfocal, well oriented, appropriate affect Breasts: The right breast is benign.  The left breast is status post lumpectomy and radiation.  There is coarsening of the skin as expected and some residual hyperpigmentation.  There is no evidence of local recurrence.  In the left upper extremity medially there is a lesion which looks like scar tissue, hyperpigmented.  Otherwise both axillae are benign.  LAB RESULTS:  CMP     Component Value Date/Time   NA 139 09/21/2019 0627   K 3.7 09/21/2019 0627   CL 101 09/21/2019 0627   CO2 27 09/21/2019 0627   GLUCOSE 143 (H) 09/21/2019 0627   BUN 12 09/21/2019 0627   CREATININE 0.83 09/21/2019 0627   CREATININE 0.81 10/25/2017 1055   CALCIUM 10.5 (H) 09/21/2019 0627   PROT 7.2 05/14/2019 1446   ALBUMIN 4.1 05/14/2019 1446   AST 16 05/14/2019 1446   AST 23 10/25/2017 1055   ALT 19 05/14/2019 1446   ALT 30 10/25/2017 1055    ALKPHOS 126 05/14/2019 1446   BILITOT 0.3 05/14/2019 1446   BILITOT 0.6 10/25/2017 1055   GFRNONAA >60 09/21/2019 0627   GFRNONAA >60 10/25/2017 1055   GFRAA >60 09/21/2019 0627   GFRAA >60 10/25/2017 1055    No results found for: TOTALPROTELP, ALBUMINELP, A1GS, A2GS, BETS, BETA2SER, GAMS, MSPIKE, SPEI  No results found for: KPAFRELGTCHN, LAMBDASER, KAPLAMBRATIO  Lab Results  Component Value Date   WBC 9.1 12/12/2019   NEUTROABS 5.3 12/12/2019   HGB 12.2 12/12/2019   HCT 38.7 12/12/2019   MCV 82.3 12/12/2019   PLT 341 12/12/2019   No results found for: LABCA2  No components found for: [id number]  No results for input(s): INR in the last 168 hours.  No results found for: LABCA2  No results found for: [id number]  No results found for: BTD176  No results found for: HYW737  No results found for: CA2729  No components found for: HGQUANT  No results found for: CEA1 / No results found for: CEA1   No results found for: AFPTUMOR  No results found for: CHROMOGRNA  No results found for: TOTALPROTELP, ALBUMINELP, A1GS, A2GS, BETS, BETA2SER, GAMS, MSPIKE, SPEI (this displays SPEP labs)  No results found for: KPAFRELGTCHN, LAMBDASER, KAPLAMBRATIO (kappa/lambda light chains)  No results found for: HGBA, HGBA2QUANT, HGBFQUANT, HGBSQUAN (Hemoglobinopathy evaluation)   No results found for: LDH  No results found for: IRON, TIBC, IRONPCTSAT (Iron and TIBC)  No results found for: FERRITIN  Urinalysis    Component Value Date/Time   BILIRUBINUR n 02/01/2019 0909   KETONESUR negative 10/07/2017 0939   PROTEINUR Negative 02/01/2019 0909   UROBILINOGEN negative (A) 02/01/2019 0909   NITRITE n 02/01/2019 0909   LEUKOCYTESUR Negative 02/01/2019 0909    STUDIES: MR BREAST BILATERAL W WO CONTRAST INC CAD  Result Date: 12/02/2019 CLINICAL DATA:  History of LEFT breast cancer in 2019 status post lumpectomy with chemotherapy and radiation therapy. Patient presented for  diagnostic mammogram and ultrasound on 11/28/2019 with clinical data of continued pain and fullness of the LEFT axilla and skin changes along the medial LEFT breast. The diagnostic mammogram/ultrasound report describes indeterminate skin thickening  in the LOWER INNER quadrant of the LEFT breast new from prior comparison study of 11/02/2018. This diagnostic mammogram/ultrasound report also described a questionable developing asymmetry within the lower inner quadrant of the LEFT breast that did not definitively persist on additional mammographic or sonographic evaluation. LABS:  Not applicable EXAM: BILATERAL BREAST MRI WITH AND WITHOUT CONTRAST TECHNIQUE: Multiplanar, multisequence MR images of both breasts were obtained prior to and following the intravenous administration of 8 ml of Gadavist Three-dimensional MR images were rendered by post-processing of the original MR data on an independent workstation. The three-dimensional MR images were interpreted, and findings are reported in the following complete MRI report for this study. Three dimensional images were evaluated at the independent interpreting workstation using the DynaCAD thin client. COMPARISON:  Previous exams including diagnostic mammogram and ultrasound dated 11/28/2019 and breast MRI dated 08/23/2017. FINDINGS: Breast composition: b. Scattered fibroglandular tissue. Background parenchymal enhancement: Moderate. Right breast: No suspicious enhancing mass, non-mass enhancement or secondary signs of malignancy within the RIGHT breast. Left breast: Expected postsurgical changes within the upper LEFT breast. Diffuse skin thickening of the central to lower LEFT breast, of similar thickness along the inner and outer aspects of the lower LEFT breast, and without suspicious enhancement kinetics. No suspicious enhancing mass, non-mass enhancement or secondary signs of malignancy are identified within the LEFT breast itself. Lymph nodes: No enlarged or  morphologically abnormal lymph nodes within either axillary region or within the internal mammary chain regions. No fluid collection or acute appearing findings within the LEFT axilla. Ancillary findings:  None. IMPRESSION: 1. Diffuse skin thickening of the central to lower LEFT breast, concordant with expected findings status post radiation therapy. No area of asymmetrically prominent skin thickening to suggest malignancy/recurrence. 2. Expected postsurgical changes within the upper LEFT breast. 3. No evidence of malignancy within either breast. RECOMMENDATION: 1. Per diagnostic report of 11/28/2019, consider surgical consultation with possible skin punch biopsy of the lower inner LEFT breast given patient's clinical description of asymmetric skin changes within the medial LEFT breast (worsening for the last several months). 2. Recommend follow-up LEFT breast diagnostic mammogram and ultrasound in 6 months. BI-RADS CATEGORY  2: Benign. Electronically Signed   By: Franki Cabot M.D.   On: 12/02/2019 12:38   DG Bone Density  Result Date: 11/28/2019 EXAM: DUAL X-RAY ABSORPTIOMETRY (DXA) FOR BONE MINERAL DENSITY IMPRESSION: Referring Physician:  Chauncey Cruel Your patient completed a BMD test using Lunar IDXA DXA system ( analysis version: 16 ) manufactured by [employer]. Technologist: AW PATIENT: Name: Chelcie, Estorga Patient ID: [id number] Birth Date: Jul 15, 1955 Height: 68.0 in. Sex: Female Measured: 11/28/2019 Weight: 205.0 lbs. Indications: Anastrazole, Bilateral Ovariectomy (65.51), Breast Cancer History, Estrogen Deficient, Hysterectomy, Levothyroxine, Postmenopausal Fractures: None Treatments: Calcium (E943.0), Hormone Therapy For Cancer, Vitamin D (E933.5) ASSESSMENT: The BMD measured at AP Spine L1-L4 (L3) is 0.996 g/cm2 with a T-score of -1.5. This patient is considered osteopenic according to [facility]) criteria. The scan quality is good. L-3 was excluded due to degenerative  changes. Site Region Measured Date Measured Age YA BMD Significant CHANGE T-score AP Spine L1-L4 (L3) 11/28/2019 64.4 -1.5 0.996 g/cm2 * AP Spine  L1-L4 (L3) 10/24/2016    61.3         -0.9    1.074 g/cm2 DualFemur Neck Right 11/28/2019 64.4 -0.8 0.930 g/cm2 * DualFemur Neck Right 10/24/2016    61.3         -0.4    0.976 g/cm2 DualFemur Total Mean 11/28/2019  64.4         0.1     1.025 g/cm2 DualFemur Total Mean 10/24/2016    61.3         0.2     1.031 g/cm2 World Health Organization [facility]) criteria for post-menopausal, Caucasian Women: Normal       T-score at or above -1 SD Osteopenia   T-score between -1 and -2.5 SD Osteoporosis T-score at or below -2.5 SD RECOMMENDATION: 1. All patients should optimize calcium and vitamin D intake. 2. Consider FDA approved medical therapies in postmenopausal women and men aged 54 years and older, based on the following: a. A hip or vertebral (clinical or morphometric) fracture b. T- score < or = -2.5 at the femoral neck or spine after appropriate evaluation to exclude secondary causes c. Low bone mass (T-score between -1.0 and -2.5 at the femoral neck or spine) and a 10 year probability of a hip fracture > or = 3% or a 10 year probability of a major osteoporosis-related fracture > or = 20% based on the [place]-adapted WHO algorithm d. Clinician judgment and/or patient preferences may indicate treatment for people with 10-year fracture probabilities above or below these levels FOLLOW-UP: People with diagnosed cases of osteoporosis or at high risk for fracture should have regular bone mineral density tests. For patients eligible for Medicare, routine testing is allowed once every 2 years. The testing frequency can be increased to one year for patients who have rapidly progressing disease, those who are receiving or discontinuing medical therapy to restore bone mass, or have additional risk factors. I have reviewed this report and agree with the above findings. [facility]  FRAX* 10-year Probability of Fracture Based on femoral neck BMD: DualFemur (Right) Major Osteoporotic Fracture: 3.2% Hip Fracture:                0.2% Population:                  [place] (Black) Risk Factors:                None *FRAX is a Materials engineer of the [employer] for Metabolic Bone Disease, a World Pharmacologist (WHO) [facility]. ASSESSMENT: The probability of a major osteoporotic fracture is 3.2 % within the next ten years. The probability of a hip fracture is 0.2 % within the next ten years. Electronically Signed   By: Rolm Baptise M.D.   On: 11/28/2019 16:51   US BREAST LTD UNI LEFT INC AXILLA  Result Date: 11/28/2019 CLINICAL DATA:  64 year old female status post malignant left lumpectomy in 2019 with chemotherapy and radiation therapy. The patient states she has had continued pain and fullness of the left axilla with skin changes along the medial left breast, worsening for the last several months. EXAM: DIGITAL DIAGNOSTIC BILATERAL MAMMOGRAM WITH CAD AND TOMO ULTRASOUND LEFT BREAST COMPARISON:  Previous exam(s). ACR Breast Density Category b: There are scattered areas of fibroglandular density. FINDINGS: Stable post lumpectomy changes are noted in the posterior upper outer quadrant of the left breast. Focal asymmetry/density in the lower inner quadrant of the left breast at middle depth disperses into fatty and fibroglandular tissue on additional spot compression views. Note is made of overlying skin thickening in the lower inner quadrant, which is new from most recent comparison mammogram. No suspicious mammographic findings are identified in the right breast. Mammographic images were processed with CAD. On physical exam, there is a focal area of skin thickening centered  in the lower inner quadrant of the left breast. I palpate no suspicious lumps in this area. Targeted ultrasound is performed, showing skin thickening up to 7 mm in the medial left  breast along the 8 and 9 o'clock positions. No suspicious sonographic findings are noted deep to the areas of focal skin thickening. IMPRESSION: 1. Indeterminate, focal skin thickening in the lower inner quadrant of the left breast, new from prior comparison study. 2. Questioned developing asymmetry in the lower inner quadrant of the left breast which does not definitively persist on additional mammographic or sonographic evaluation. 3. Stable postoperative changes in the upper-outer quadrant of the left breast. 4. No mammographic evidence of malignancy on the right. RECOMMENDATION: 1. Recommendation is for further evaluation of the patient's new findings in the lower inner quadrant of the left breast with contrast enhanced breast MRI. 2. Pending MRI results, consider further evaluation with surgical consultation and skin punch biopsy based on clinical suspicion. I have discussed the findings and recommendations with the patient. If applicable, a reminder letter will be sent to the patient regarding the next appointment. BI-RADS CATEGORY  4: Suspicious. Electronically Signed   By: Kristopher Oppenheim M.D.   On: 11/28/2019 16:40   MM DIAG BREAST TOMO BILATERAL  Result Date: 11/28/2019 CLINICAL DATA:  64 year old female status post malignant left lumpectomy in 2019 with chemotherapy and radiation therapy. The patient states she has had continued pain and fullness of the left axilla with skin changes along the medial left breast, worsening for the last several months. EXAM: DIGITAL DIAGNOSTIC BILATERAL MAMMOGRAM WITH CAD AND TOMO ULTRASOUND LEFT BREAST COMPARISON:  Previous exam(s). ACR Breast Density Category b: There are scattered areas of fibroglandular density. FINDINGS: Stable post lumpectomy changes are noted in the posterior upper outer quadrant of the left breast. Focal asymmetry/density in the lower inner quadrant of the left breast at middle depth disperses into fatty and fibroglandular tissue on additional  spot compression views. Note is made of overlying skin thickening in the lower inner quadrant, which is new from most recent comparison mammogram. No suspicious mammographic findings are identified in the right breast. Mammographic images were processed with CAD. On physical exam, there is a focal area of skin thickening centered in the lower inner quadrant of the left breast. I palpate no suspicious lumps in this area. Targeted ultrasound is performed, showing skin thickening up to 7 mm in the medial left breast along the 8 and 9 o'clock positions. No suspicious sonographic findings are noted deep to the areas of focal skin thickening. IMPRESSION: 1. Indeterminate, focal skin thickening in the lower inner quadrant of the left breast, new from prior comparison study. 2. Questioned developing asymmetry in the lower inner quadrant of the left breast which does not definitively persist on additional mammographic or sonographic evaluation. 3. Stable postoperative changes in the upper-outer quadrant of the left breast. 4. No mammographic evidence of malignancy on the right. RECOMMENDATION: 1. Recommendation is for further evaluation of the patient's new findings in the lower inner quadrant of the left breast with contrast enhanced breast MRI. 2. Pending MRI results, consider further evaluation with surgical consultation and skin punch biopsy based on clinical suspicion. I have discussed the findings and recommendations with the patient. If applicable, a reminder letter will be sent to the patient regarding the next appointment. BI-RADS CATEGORY  4: Suspicious. Electronically Signed   By: Kristopher Oppenheim M.D.   On: 11/28/2019 16:40     ELIGIBLE FOR AVAILABLE RESEARCH PROTOCOL: No  ASSESSMENT: 64 y.o. [place], [place] woman status post left breast upper outer quadrant and left axillary lymph node biopsy 08/07/2017, both positive for a T2 N1, stage IIB invasive ductal carcinoma, grade 3, estrogen receptor weakly  positive, progesterone receptor negative, but HER-2 amplified, with an MIB-1 of 80%  (a) staging chest CT and bone scan 08/30/2017 showed no evidence of metastatic disease  (1) neoadjuvant chemotherapy will consist of carboplatin, docetaxel, trastuzumab, and Pertuzumab given every 21 days x 6 starting 09/07/2017, perjeta omitted with cycle 2 and 3 due to diarrhea  (a) Gemcitabine substituted for Docetaxel beginning with cycle 5 and 6 for concerns of neuropathy  (2) trastuzumab continued to complete 6 months, last dose 04/06/2018  (a) echocardiogram 08/28/2017 showed an ejection fraction in the 60-65% range.  (b) echocardiogram on 11/29/2017 showed an EF of 55-60%  (c) echocardiogram 03/07/2018 showed an ejection fraction in the 55-60% range  (3) Left lumpectomy on 01/22/2018 shows a ypT1b pN1a, grade 3 residual invasive ductal carcinoma, agnostic panel HER-2 negative, ER 40% weakly positive, PR negative.   (a) foundation one testing requested on 02/02/2018  (b) total of 11 axillary lymph nodes removed (1+)  (4) adjuvant radiation completed 04/20/2018  (5) started anastrozole 05/30/2018  (a) bone density 10/25/2016 normal with a T score of -0.7  (b) bone density 11/28/2019 shows a T score of -1.5  (6) status post total thyroidectomy 09/20/2019 for papillary thyroid carcinoma   PLAN: Vastie is now just about 2 years out from definitive surgery for her breast cancer with no evidence of disease recurrence.  This is very favorable.  She is tolerating anastrozole generally well.  She does have mild osteopenia.  We discussed exercise particularly walking today.  I suggested she try Cortaid for the lesion in the left upper arm and if she does not see any improvement in a week or 2 see dermatology for possible excision.  I think she would benefit from a survivorship visit and I am scheduling that for her in about 6 months.  Otherwise she will see me again in 1 year.  She knows to call for any  other issue that may develop before back  Total encounter time 30 minutes.*   Arvella Massingale, [name] Dad, MD  12/12/19 2:58 PM Medical Oncology and Hematology [facility] [place], [place] [address] Tel. [phone]    Fax. [phone]    I, Wilburn Mylar, am acting as scribe for Dr. [name] Dad. Shamir Tuzzolino.  I, Lurline Del MD, have reviewed the above documentation for accuracy and completeness, and I agree with the above.   *Total Encounter Time as defined by the Centers for Medicare and Medicaid Services includes, in addition to the face-to-face time of a patient visit (documented in the note above) non-face-to-face time: obtaining and reviewing outside history, ordering and reviewing medications, tests or procedures, care coordination (communications with other health care professionals or caregivers) and documentation in the medical record.

## 2019-12-13 ENCOUNTER — Telehealth: Payer: Self-pay | Admitting: Oncology

## 2019-12-13 NOTE — Telephone Encounter (Signed)
Called and Scheduled appt per 10/14 LOS - pt is aware of appts.

## 2020-01-14 ENCOUNTER — Ambulatory Visit (INDEPENDENT_AMBULATORY_CARE_PROVIDER_SITE_OTHER): Payer: 59 | Admitting: Physician Assistant

## 2020-01-14 ENCOUNTER — Encounter: Payer: Self-pay | Admitting: Physician Assistant

## 2020-01-14 ENCOUNTER — Other Ambulatory Visit: Payer: Self-pay

## 2020-01-14 VITALS — BP 122/70 | HR 67 | Temp 97.3°F | Ht 69.0 in | Wt 206.4 lb

## 2020-01-14 DIAGNOSIS — Z0001 Encounter for general adult medical examination with abnormal findings: Secondary | ICD-10-CM

## 2020-01-14 DIAGNOSIS — R739 Hyperglycemia, unspecified: Secondary | ICD-10-CM

## 2020-01-14 DIAGNOSIS — C50412 Malignant neoplasm of upper-outer quadrant of left female breast: Secondary | ICD-10-CM

## 2020-01-14 DIAGNOSIS — Z23 Encounter for immunization: Secondary | ICD-10-CM

## 2020-01-14 DIAGNOSIS — D440 Neoplasm of uncertain behavior of thyroid gland: Secondary | ICD-10-CM

## 2020-01-14 DIAGNOSIS — R2242 Localized swelling, mass and lump, left lower limb: Secondary | ICD-10-CM

## 2020-01-14 DIAGNOSIS — E669 Obesity, unspecified: Secondary | ICD-10-CM | POA: Diagnosis not present

## 2020-01-14 DIAGNOSIS — Z170 Estrogen receptor positive status [ER+]: Secondary | ICD-10-CM

## 2020-01-14 DIAGNOSIS — Z1211 Encounter for screening for malignant neoplasm of colon: Secondary | ICD-10-CM

## 2020-01-14 DIAGNOSIS — M79622 Pain in left upper arm: Secondary | ICD-10-CM

## 2020-01-14 NOTE — Patient Instructions (Signed)
It was great to see you!  I am going to send you to Dr. Lynne Leader, one of our sports medicine providers, to discuss your arm.  I am going to reach out to Dr. Geanie Berlin office to figure out how to get you plugged back in for the mass on your foot.  I am going to order an updated Cologuard. We should be able to find out if you've had one recently.   Please go to the lab for blood work.   Our office will call you with your results unless you have chosen to receive results via MyChart.  If your blood work is normal we will follow-up each year for physicals and as scheduled for chronic medical problems.  If anything is abnormal we will treat accordingly and get you in for a follow-up.  Take care,  [facility] Maintenance, Female Adopting a healthy lifestyle and getting preventive care are important in promoting health and wellness. Ask your health care provider about:  The right schedule for you to have regular tests and exams.  Things you can do on your own to prevent diseases and keep yourself healthy. What should I know about diet, weight, and exercise? Eat a healthy diet   Eat a diet that includes plenty of vegetables, fruits, low-fat dairy products, and lean protein.  Do not eat a lot of foods that are high in solid fats, added sugars, or sodium. Maintain a healthy weight Body mass index (BMI) is used to identify weight problems. It estimates body fat based on height and weight. Your health care provider can help determine your BMI and help you achieve or maintain a healthy weight. Get regular exercise Get regular exercise. This is one of the most important things you can do for your health. Most adults should:  Exercise for at least 150 minutes each week. The exercise should increase your heart rate and make you sweat (moderate-intensity exercise).  Do strengthening exercises at least twice a week. This is in addition to the moderate-intensity exercise.  Spend less  time sitting. Even light physical activity can be beneficial. Watch cholesterol and blood lipids Have your blood tested for lipids and cholesterol at 64 years of age, then have this test every 5 years. Have your cholesterol levels checked more often if:  Your lipid or cholesterol levels are high.  You are older than 64 years of age.  You are at high risk for heart disease. What should I know about cancer screening? Depending on your health history and family history, you may need to have cancer screening at various ages. This may include screening for:  Breast cancer.  Cervical cancer.  Colorectal cancer.  Skin cancer.  Lung cancer. What should I know about heart disease, diabetes, and high blood pressure? Blood pressure and heart disease  High blood pressure causes heart disease and increases the risk of stroke. This is more likely to develop in people who have high blood pressure readings, are of African descent, or are overweight.  Have your blood pressure checked: ? Every 3-5 years if you are 44-70 years of age. ? Every year if you are 37 years old or older. Diabetes Have regular diabetes screenings. This checks your fasting blood sugar level. Have the screening done:  Once every three years after age 7 if you are at a normal weight and have a low risk for diabetes.  More often and at a younger age if you are overweight or have a  high risk for diabetes. What should I know about preventing infection? Hepatitis B If you have a higher risk for hepatitis B, you should be screened for this virus. Talk with your health care provider to find out if you are at risk for hepatitis B infection. Hepatitis C Testing is recommended for:  Everyone born from [birth year] through 1965.  Anyone with known risk factors for hepatitis C. Sexually transmitted infections (STIs)  Get screened for STIs, including gonorrhea and chlamydia, if: ? You are sexually active and are younger than 64 years  of age. ? You are older than 64 years of age and your health care provider tells you that you are at risk for this type of infection. ? Your sexual activity has changed since you were last screened, and you are at increased risk for chlamydia or gonorrhea. Ask your health care provider if you are at risk.  Ask your health care provider about whether you are at high risk for HIV. Your health care provider may recommend a prescription medicine to help prevent HIV infection. If you choose to take medicine to prevent HIV, you should first get tested for HIV. You should then be tested every 3 months for as long as you are taking the medicine. Pregnancy  If you are about to stop having your period (premenopausal) and you may become pregnant, seek counseling before you get pregnant.  Take 400 to 800 micrograms (mcg) of folic acid every day if you become pregnant.  Ask for birth control (contraception) if you want to prevent pregnancy. Osteoporosis and menopause Osteoporosis is a disease in which the bones lose minerals and strength with aging. This can result in bone fractures. If you are 17 years old or older, or if you are at risk for osteoporosis and fractures, ask your health care provider if you should:  Be screened for bone loss.  Take a calcium or vitamin D supplement to lower your risk of fractures.  Be given hormone replacement therapy (HRT) to treat symptoms of menopause. Follow these instructions at home: Lifestyle  Do not use any products that contain nicotine or tobacco, such as cigarettes, e-cigarettes, and chewing tobacco. If you need help quitting, ask your health care provider.  Do not use street drugs.  Do not share needles.  Ask your health care provider for help if you need support or information about quitting drugs. Alcohol use  Do not drink alcohol if: ? Your health care provider tells you not to drink. ? You are pregnant, may be pregnant, or are planning to become  pregnant.  If you drink alcohol: ? Limit how much you use to 0-1 drink a day. ? Limit intake if you are breastfeeding.  Be aware of how much alcohol is in your drink. In the [place] one drink equals one 12 oz bottle of beer (355 mL), one 5 oz glass of wine (148 mL), or one 1 oz glass of hard liquor (44 mL). General instructions  Schedule regular health, dental, and eye exams.  Stay current with your vaccines.  Tell your health care provider if: ? You often feel depressed. ? You have ever been abused or do not feel safe at home. Summary  Adopting a healthy lifestyle and getting preventive care are important in promoting health and wellness.  Follow your health care provider's instructions about healthy diet, exercising, and getting tested or screened for diseases.  Follow your health care provider's instructions on monitoring your cholesterol and blood pressure. This information  is not intended to replace advice given to you by your health care provider. Make sure you discuss any questions you have with your health care provider. Document Revised: 02/07/2018 Document Reviewed: 02/07/2018 Elsevier Patient Education  2020 [employer].

## 2020-01-14 NOTE — Progress Notes (Signed)
Madison Hopkins is a 64 y.o. female here to establish care.  I acted as a Education administrator for [employer], PA-C Anselmo Pickler, LPN   History of Present Illness:   Chief Complaint  Patient presents with  . Establish Care  . Annual Exam    Acute Concerns: L arm pain -- had about 11 lymph nodes removed from her L axilla due to breast cancer. Over time, she has had worsening pain to her L bicep region. She also gets some associated numbness. She has tried several OTC creams without relief. She is concerned and wants to figure out what's going on. Denies weakness to L hand. Mass on L foot -- seen by Dr. Paulla Dolly in April 2019. Recommended to have MRI but she never had this done because she was overwhelmed without medical issues. She feels the area is getting bigger and wants to follow-up with him on this.  Chronic Issues: Breast cancer -- regularly got mammograms her whole life. Long history of dense breasts. Found a spot during showering, ends up getting mammogram and diagnosed with stage 3 cancer. Followed by Dr. Jana Hakim. Underwent 33 radiation treatments. 8-9 months of chemo. Thyroid cancer - diagnosed this year, had appropriate treatment. Hyperglycemia -- has had elevated blood sugars in the past, but nothing significant; denies hypo- or hyper-glycemic episodes  Health Maintenance: Immunizations -- UTD, will give Flu vaccine today; received COVID vaxx x 2 Colonoscopy -- never, had Cologuard 2-3 yrs ago will call and get results. Mammogram -- UTD PAP -- UTD Bone Density -- UTD Diet -- tries to follow a low iodine and low soy diet; no juices or sodas Exercise -- "could improve"; has issues with knee pain Weight -- Weight: 206 lb 6.1 oz (93.6 kg)  Mood -- no concerns Alcohol -- very rare Tobacco -- no tobacco  Depression screen PHQ 2/9 01/14/2020  Decreased Interest 0  Down, Depressed, Hopeless 0  PHQ - 2 Score 0  Some recent data might be hidden    No flowsheet data  found.   Other providers/specialists: Patient Care Team: [name], [place] as PCP - General (Physician Assistant) [name], [name] Dad, MD as Consulting Physician (Oncology) Kyung Rudd, MD as Consulting Physician (Radiation Oncology) [name], [name] All, MD as Consulting Physician (Obstetrics and Gynecology) Haroldine Laws, [name], MD as Consulting Physician (Cardiology) [name], MD as Attending Physician (Physical Medicine and Rehabilitation) [facility], [name], MD as Consulting Physician (Endocrinology)   Past Medical History:  Diagnosis Date  . Abnormal Pap smear of vagina 07/28/2016   LGSIL; colpo 07/2016 atrophic squamous cells; colpo 07/2017 - atypia  . Allergy   . Breast cancer ([place]) 2018   Metastatic Left breast  . Elevated hemoglobin A1c 07/28/2016   level - 6.1  . Endometriosis   . GERD (gastroesophageal reflux disease)   . HSV-2 infection   . Iron deficiency anemia   . Low vitamin D level 07/28/2016   level 24.7  . Personal history of chemotherapy    finished 10/19  . Personal history of radiation therapy    finished 03/2018  . Thyroid cancer ([place])   . Thyroid neoplasm      Social History   Tobacco Use  . Smoking status: Never Smoker  . Smokeless tobacco: Never Used  Vaping Use  . Vaping Use: Never used  Substance Use Topics  . Alcohol use: Yes    Alcohol/week: 1.0 standard drink    Types: 1 Glasses of wine per week  . Drug  use: Not Currently    Types: Marijuana    Comment: everyday    Past Surgical History:  Procedure Laterality Date  . ABDOMINAL HYSTERECTOMY    . BREAST BIOPSY Left 08/09/2017  . BREAST LUMPECTOMY Left 01/22/2018  . BREAST LUMPECTOMY WITH RADIOACTIVE SEED AND AXILLARY LYMPH NODE DISSECTION Left 01/22/2018   Procedure: LEFT BREAST LUMPECTOMY WITH RADIOACTIVE SEED AND COMPLETE LEFT AXILLARY LYMPH NODE DISSECTION;  Surgeon: [name], MD;  Location: [facility];  Service: General;  Laterality: Left;   . CESAREAN SECTION    . COLON SURGERY    . COLOSTOMY    . COLOSTOMY REVERSAL    . FOOT SURGERY Right    done spur  . OOPHORECTOMY    . PORT-A-CATH REMOVAL Right 08/28/2018   Procedure: REMOVAL PORT-A-CATH;  Surgeon: [name], MD;  Location: [facility];  Service: General;  Laterality: Right;  . PORTACATH PLACEMENT N/A 09/06/2017   Procedure: INSERTION PORT-A-CATH;  Surgeon: [name], MD;  Location: [facility];  Service: General;  Laterality: N/A;  . SMALL INTESTINE SURGERY    . SPINE SURGERY     Injections  . THYROIDECTOMY N/A 09/20/2019   Procedure: TOTAL THYROIDECTOMY;  Surgeon: [name], MD;  Location: [facility];  Service: General;  Laterality: N/A;    Family History  Problem Relation Age of Onset  . Mental illness Mother   . Alcoholism Mother   . Cirrhosis Mother   . Cancer Father   . Lung cancer Father     Allergies  Allergen Reactions  . Bee Venom   . Tramadol Nausea Only  . Penicillins Nausea Only    Has patient had a PCN reaction causing immediate rash, facial/tongue/throat swelling, SOB or lightheadedness with hypotension: No Has patient had a PCN reaction causing severe rash involving mucus membranes or skin necrosis: No Has patient had a PCN reaction that required hospitalization: No Has patient had a PCN reaction occurring within the last 10 years: Yes--nausea & headache ONLY If all of the above answers are "NO", then may proceed with Cephalosporin use.      Current Medications:   Current Outpatient Medications:  .  acetaminophen-codeine (TYLENOL #3) 300-30 MG tablet, SMARTSIG:1-2 Tablet(s) By Mouth 2-3 Times Daily PRN, Disp: , Rfl:  .  acyclovir ointment (ZOVIRAX) 5 %, Apply 1 application topically every 4 (four) hours., Disp: 15 g, Rfl: 2 .  anastrozole (ARIMIDEX) 1 MG tablet, TAKE 1 TABLET BY MOUTH EVERY DAY, Disp: 30 tablet, Rfl: 14 .  Artificial Tear Solution (OPTI-TEARS OP), Place 1 drop into both eyes 3 (three) times daily as  needed (for dry/irritated eyes.)., Disp: , Rfl:  .  Ascorbic Acid (VITAMIN C) 100 MG CHEW, Chew 100 mg by mouth daily. , Disp: , Rfl:  .  BORIC ACID EX, Apply topically. , Disp: , Rfl:  .  fluticasone (FLONASE) 50 MCG/ACT nasal spray, Place 1 spray into both nostrils daily as needed for allergies., Disp: 11.1 mL, Rfl: 0 .  levothyroxine (SYNTHROID) 125 MCG tablet, Take 1 tablet (125 mcg total) by mouth daily., Disp: 90 tablet, Rfl: 3 .  Misc Natural Products (OSTEO BI-FLEX ADV TRIPLE ST PO), Take 1 tablet by mouth daily after lunch. , Disp: , Rfl:  .  Multiple Vitamin (MULTIVITAMIN WITH MINERALS) TABS tablet, Take 1 tablet by mouth daily after lunch., Disp: , Rfl:  .  Multiple Vitamins-Minerals (VITAMIN D3 COMPLETE PO), Take 1 tablet by mouth daily. , Disp: , Rfl:  .  naproxen sodium (ALEVE) 220 MG tablet, Take 220 mg by mouth 2 (two) times daily as needed (FOR PAIN.). , Disp: , Rfl:  .  Probiotic Product (ALIGN PO), Take by mouth. , Disp: , Rfl:  .  tetrahydrozoline (VISINE) 0.05 % ophthalmic solution, Place 1 drop into both eyes 3 (three) times daily as needed (for dry eyes.). , Disp: , Rfl:    Review of Systems:   Review of Systems  Constitutional: Negative for chills, fever, malaise/fatigue and weight loss.  HENT: Negative for hearing loss, sinus pain and sore throat.   Respiratory: Negative for cough and hemoptysis.   Cardiovascular: Negative for chest pain, palpitations, leg swelling and PND.  Gastrointestinal: Negative for abdominal pain, constipation, diarrhea, heartburn, nausea and vomiting.  Genitourinary: Negative for dysuria, frequency and urgency.  Musculoskeletal: Negative for back pain, myalgias and neck pain.  Skin: Negative for itching and rash.  Neurological: Negative for dizziness, tingling, seizures and headaches.  Endo/Heme/Allergies: Negative for polydipsia.  Psychiatric/Behavioral: Negative for depression. The patient is not nervous/anxious.     Vitals:   Vitals:    01/14/20 0755  BP: 122/70  Pulse: 67  Temp: (!) 97.3 F (36.3 C)  TempSrc: Temporal  SpO2: 97%  Weight: 206 lb 6.1 oz (93.6 kg)  Height: 5\' 9"  (1.753 m)      Body mass index is 30.48 kg/m.  Physical Exam:   Physical Exam Vitals and nursing note reviewed.  Constitutional:      General: She is not in acute distress.    Appearance: Normal appearance. She is well-developed. She is not ill-appearing or toxic-appearing.  HENT:     Head: Normocephalic and atraumatic.     Right Ear: Tympanic membrane, ear canal and external ear normal. Tympanic membrane is not erythematous, retracted or bulging.     Left Ear: Tympanic membrane, ear canal and external ear normal. Tympanic membrane is not erythematous, retracted or bulging.  Eyes:     General: Lids are normal.     Conjunctiva/sclera: Conjunctivae normal.     Pupils: Pupils are equal, round, and reactive to light.  Neck:     Trachea: Trachea normal.  Cardiovascular:     Rate and Rhythm: Normal rate and regular rhythm.     Heart sounds: Normal heart sounds, S1 normal and S2 normal.  Pulmonary:     Effort: Pulmonary effort is normal. No tachypnea or respiratory distress.     Breath sounds: Normal breath sounds. No decreased breath sounds, wheezing, rhonchi or rales.  Abdominal:     General: Bowel sounds are normal.     Palpations: Abdomen is soft.     Tenderness: There is no abdominal tenderness.  Musculoskeletal:        General: Normal range of motion.     Cervical back: Full passive range of motion without pain.     Comments: TTP to L bicep area  Lymphadenopathy:     Cervical: No cervical adenopathy.  Skin:    General: Skin is warm and dry.     Comments: 4-cm mass to mid-plantar surface of L foot  Neurological:     Mental Status: She is alert.     GCS: GCS eye subscore is 4. GCS verbal subscore is 5. GCS motor subscore is 6.     Cranial Nerves: No cranial nerve deficit.     Sensory: No sensory deficit.     Deep Tendon  Reflexes: Reflexes are normal and symmetric.  Psychiatric:  Speech: Speech normal.        Behavior: Behavior normal. Behavior is cooperative.     Assessment and Plan:   Keiaira was seen today for establish care and annual exam.  Diagnoses and all orders for this visit:  Encounter for general adult medical examination with abnormal findings Today patient counseled on age appropriate routine health concerns for screening and prevention, each reviewed and up to date or declined. Immunizations reviewed and up to date or declined. Labs ordered and reviewed. Risk factors for depression reviewed and negative. Hearing function and visual acuity are intact. ADLs screened and addressed as needed. Functional ability and level of safety reviewed and appropriate. Education, counseling and referrals performed based on assessed risks today. Patient provided with a copy of personalized plan for preventive services.  Neoplasm of uncertain behavior of thyroid gland Management per Dr. Kelton Pillar.  Malignant neoplasm of upper-outer quadrant of left breast in female, estrogen receptor positive ([place]) Management per Dr. Jana Hakim.  Obesity, unspecified classification, unspecified obesity type, unspecified whether serious comorbidity present Encouraged healthy diet and exercise as able. -     CBC with Differential/Platelet; Future -     Comprehensive metabolic panel; Future  Hyperglycemia Update A1c and will advise further based on recommendations. -     Hemoglobin A1c; Future  Special screening for malignant neoplasms, colon -     Cologuard  Left upper arm pain Referral to [employer]. -     Ambulatory referral to Sports Medicine  Foot mass, left Referral back to Dr. Paulla Dolly, she is ready to proceed with MRI. -     Ambulatory referral to [place] or LPN served as scribe during this visit. History, Physical, and Plan performed by medical provider. The above documentation has been reviewed  and is accurate and complete.  [name], PA-C

## 2020-01-15 LAB — CBC WITH DIFFERENTIAL/PLATELET
Absolute Monocytes: 360 cells/uL (ref 200–950)
Basophils Absolute: 41 cells/uL (ref 0–200)
Basophils Relative: 0.7 %
Eosinophils Absolute: 139 cells/uL (ref 15–500)
Eosinophils Relative: 2.4 %
HCT: 38.1 % (ref 35.0–45.0)
Hemoglobin: 12.3 g/dL (ref 11.7–15.5)
Lymphs Abs: 2262 cells/uL (ref 850–3900)
MCH: 26.6 pg — ABNORMAL LOW (ref 27.0–33.0)
MCHC: 32.3 g/dL (ref 32.0–36.0)
MCV: 82.5 fL (ref 80.0–100.0)
MPV: 10.3 fL (ref 7.5–12.5)
Monocytes Relative: 6.2 %
Neutro Abs: 2999 cells/uL (ref 1500–7800)
Neutrophils Relative %: 51.7 %
Platelets: 345 10*3/uL (ref 140–400)
RBC: 4.62 10*6/uL (ref 3.80–5.10)
RDW: 13.1 % (ref 11.0–15.0)
Total Lymphocyte: 39 %
WBC: 5.8 10*3/uL (ref 3.8–10.8)

## 2020-01-15 LAB — COMPREHENSIVE METABOLIC PANEL
AG Ratio: 1.5 (calc) (ref 1.0–2.5)
ALT: 17 U/L (ref 6–29)
AST: 14 U/L (ref 10–35)
Albumin: 4.3 g/dL (ref 3.6–5.1)
Alkaline phosphatase (APISO): 140 U/L (ref 37–153)
BUN: 14 mg/dL (ref 7–25)
CO2: 23 mmol/L (ref 20–32)
Calcium: 10.2 mg/dL (ref 8.6–10.4)
Chloride: 108 mmol/L (ref 98–110)
Creat: 0.9 mg/dL (ref 0.50–0.99)
Globulin: 2.8 g/dL (calc) (ref 1.9–3.7)
Glucose, Bld: 106 mg/dL — ABNORMAL HIGH (ref 65–99)
Potassium: 4.1 mmol/L (ref 3.5–5.3)
Sodium: 142 mmol/L (ref 135–146)
Total Bilirubin: 0.5 mg/dL (ref 0.2–1.2)
Total Protein: 7.1 g/dL (ref 6.1–8.1)

## 2020-01-15 LAB — HEMOGLOBIN A1C
Hgb A1c MFr Bld: 6.5 % of total Hgb — ABNORMAL HIGH (ref <5.7)
Mean Plasma Glucose: 140 (calc)
eAG (mmol/L): 7.7 (calc)

## 2020-01-16 NOTE — Progress Notes (Signed)
Subjective:    I'm seeing this patient as a consultation for:  Len Blalock, [place]. Note will be routed back to referring provider/PCP.  CC: L arm pain  I, Molly Weber, LAT, ATC, am serving as scribe for Dr. Lynne Leader.  HPI: Pt is a 64 y/o female presenting w/ c/o L upper arm pain that began after getting 11 lymph nodes from her L axilla due to breast cancer.  She locates her pain to posterior aspect of L arm. Describes pn as tight/stiff and pn when laying on L side. L arm swelling: yes Aggravating factors: can't use deodorants and free of fragrances on L side Treatments tried: OTC pain-relieving creams;   Past medical history, Surgical history, Family history, Social history, Allergies, and medications have been entered into the medical record, reviewed.   Review of Systems: No new headache, visual changes, nausea, vomiting, diarrhea, constipation, dizziness, abdominal pain, skin rash, fevers, chills, night sweats, weight loss, swollen lymph nodes, body aches, joint swelling, muscle aches, chest pain, shortness of breath, mood changes, visual or auditory hallucinations.   Objective:    Vitals:   01/17/20 0901  BP: (!) 146/84  Pulse: 64  SpO2: 100%   General: Well Developed, well nourished, and in no acute distress.  Neuro/Psych: Alert and oriented x3, extra-ocular muscles intact, able to move all 4 extremities, sensation grossly intact. Skin: Warm and dry, no rashes noted.  Respiratory: Not using accessory muscles, speaking in full sentences, trachea midline.  Cardiovascular: Pulses palpable, no extremity edema. Abdomen: Does not appear distended. MSK: Left shoulder and arm.  Slightly larger/swollen left upper arm compared to right. Hyperpigmented skin/flap scar just distal to axilla upper arm. Not particularly tender to palpation along shoulder axilla upper arm. No masses palpated. No severe lymphedema palpated. Range of motion: Abduction full external rotation full internal  rotation lumbar spine. Strength 4/5 abduction 4/5 external rotation 5/5 internal rotation. Mildly positive Hawkins and Neer's test. Mildly positive empty can test. Negative Yergason's and speeds test.  Pulses cap refill and sensation are intact bilateral upper extremities. No significant hand or forearm swelling left compared to right.  Lab and Radiology Results  X-ray images left shoulder obtained today personally and independently interpreted. No severe glenohumeral DJD.  Surgical clips present in axilla.  No fracture or malalignment.  No significant tumor involvement Await formal radiology review  Diagnostic Limited MSK Ultrasound of: Left shoulder Biceps tendon intact normal-appearing Subscapularis tendon intact without tear or retraction. Supraspinatus tendon intact without significant tear or retraction.  Mild subacromial bursa thickness present. Infraspinatus tendon intact without tear or retraction. AC joint mildly narrowed degenerative with mild effusion. Impression: AC DJD mild subacromial bursitis   Impression and Recommendations:    Assessment and Plan: 65 y.o. female with left shoulder pain multifactorial and left shoulder and upper arm paresthesia.  Patient has a pertinent medical history for breast cancer status post lymph node excision, neoadjuvant chemotherapy, lumpectomy and radiation therapy.  A lot of her pain today is due to rotator cuff tendinopathy and dysfunction.  This should improve with physical therapy and typical conservative management.  PT order.  She does have some upper arm swelling and has enough lymph node excision that I am concerned for mild lymphedema.  I think it is worthwhile for her to have consultation with lymphedema physical therapy specialist.  Separate PT ordered for lymphedema evaluation and potential treatment ordered today.  Lastly she has some bothersome paresthesia symptoms to her upper arm near her surgical site.  I believe these  are most likely secondary to her surgery and possibly may be affected somewhat by her history of chemotherapy.  Discussed options.  We will try a little bit of gabapentin mostly at bedtime.  This may help some of her symptoms as well.  Check back in about a month or 2.  Return sooner if needed.  PDMP not reviewed this encounter. Orders Placed This Encounter  Procedures  . [place] LIMITED JOINT SPACE STRUCTURES UP LEFT(NO LINKED CHARGES)    Order Specific Question:   Reason for Exam (SYMPTOM  OR DIAGNOSIS REQUIRED)    Answer:   left shoulder    Order Specific Question:   Preferred imaging location?    Answer:   [facility]  . DG Shoulder Left    Standing Status:   Future    Number of Occurrences:   1    Standing Expiration Date:   01/16/2021    Order Specific Question:   Reason for Exam (SYMPTOM  OR DIAGNOSIS REQUIRED)    Answer:   eval shoulder pain    Order Specific Question:   Preferred imaging location?    Answer:   [facility]  . Ambulatory referral to Physical Therapy    Referral Priority:   Routine    Referral Type:   Physical Medicine    Referral Reason:   Specialty Services Required    Requested Specialty:   Physical Therapy  . Ambulatory referral to Physical Therapy    Referral Priority:   Routine    Referral Type:   Physical Medicine    Referral Reason:   Specialty Services Required    Requested Specialty:   Physical Therapy   No orders of the defined types were placed in this encounter.   Discussed warning signs or symptoms. Please see discharge instructions. Patient expresses understanding.   The above documentation has been reviewed and is accurate and complete Lynne Leader, M.D.

## 2020-01-17 ENCOUNTER — Encounter: Payer: Self-pay | Admitting: Family Medicine

## 2020-01-17 ENCOUNTER — Ambulatory Visit (INDEPENDENT_AMBULATORY_CARE_PROVIDER_SITE_OTHER): Payer: 59

## 2020-01-17 ENCOUNTER — Other Ambulatory Visit: Payer: Self-pay

## 2020-01-17 ENCOUNTER — Ambulatory Visit (INDEPENDENT_AMBULATORY_CARE_PROVIDER_SITE_OTHER): Payer: 59 | Admitting: Family Medicine

## 2020-01-17 ENCOUNTER — Ambulatory Visit: Payer: Self-pay

## 2020-01-17 ENCOUNTER — Encounter: Payer: Self-pay | Admitting: Physician Assistant

## 2020-01-17 VITALS — BP 146/84 | HR 64 | Ht 69.0 in | Wt 205.0 lb

## 2020-01-17 DIAGNOSIS — M25512 Pain in left shoulder: Secondary | ICD-10-CM | POA: Diagnosis not present

## 2020-01-17 DIAGNOSIS — R202 Paresthesia of skin: Secondary | ICD-10-CM

## 2020-01-17 DIAGNOSIS — I890 Lymphedema, not elsewhere classified: Secondary | ICD-10-CM

## 2020-01-17 NOTE — Patient Instructions (Addendum)
Thank you for coming in today.  I think the nerve thing is paresthesia.   I think you have a little lymphadema.   I think you also have some rotator cuff problem.   Lets do regular PT and lymphadema PT eval.   Please get an Xray today before you leave  Try voltaren gel.   Try aspercream with lidocaine  Try capisacian cream.   Recheck in 6-8 weeks.

## 2020-01-20 NOTE — Progress Notes (Signed)
Xray shoulder shows no fracture or significant arthritis. Looks ok

## 2020-01-21 LAB — COLOGUARD: Cologuard: NEGATIVE

## 2020-01-29 ENCOUNTER — Telehealth: Payer: Self-pay

## 2020-01-29 NOTE — Telephone Encounter (Signed)
Patient wants to speak with the nurse no information given. °

## 2020-01-29 NOTE — Telephone Encounter (Signed)
Patient complaining of vaginal irritation and requesting appointment. Advised have appointment tomorrow with Dr.Silva at 8:00am to arrive at 7:45am to register. Patient voiced understanding.

## 2020-01-30 ENCOUNTER — Encounter: Payer: Self-pay | Admitting: Podiatry

## 2020-01-30 ENCOUNTER — Ambulatory Visit: Payer: 59 | Admitting: Obstetrics and Gynecology

## 2020-01-30 ENCOUNTER — Ambulatory Visit (INDEPENDENT_AMBULATORY_CARE_PROVIDER_SITE_OTHER): Payer: 59 | Admitting: Podiatry

## 2020-01-30 ENCOUNTER — Other Ambulatory Visit: Payer: Self-pay

## 2020-01-30 ENCOUNTER — Ambulatory Visit (INDEPENDENT_AMBULATORY_CARE_PROVIDER_SITE_OTHER): Payer: 59

## 2020-01-30 ENCOUNTER — Encounter: Payer: Self-pay | Admitting: Obstetrics and Gynecology

## 2020-01-30 VITALS — BP 138/76 | HR 68 | Ht 68.0 in | Wt 205.0 lb

## 2020-01-30 DIAGNOSIS — B009 Herpesviral infection, unspecified: Secondary | ICD-10-CM | POA: Diagnosis not present

## 2020-01-30 DIAGNOSIS — S9032XA Contusion of left foot, initial encounter: Secondary | ICD-10-CM | POA: Diagnosis not present

## 2020-01-30 DIAGNOSIS — D492 Neoplasm of unspecified behavior of bone, soft tissue, and skin: Secondary | ICD-10-CM

## 2020-01-30 MED ORDER — VALACYCLOVIR HCL 500 MG PO TABS
500.0000 mg | ORAL_TABLET | Freq: Two times a day (BID) | ORAL | 1 refills | Status: DC
Start: 2020-01-30 — End: 2020-10-27

## 2020-01-30 MED ORDER — LIDOCAINE 5 % EX OINT: 1.0000 "application " | TOPICAL_OINTMENT | Freq: Three times a day (TID) | CUTANEOUS | 1 refills | Status: DC

## 2020-01-30 NOTE — Progress Notes (Signed)
GYNECOLOGY  VISIT   HPI: 64 y.o.   Single  African American  female   [phone] with No LMP recorded (lmp unknown). Patient has had a hysterectomy.   here for irritation between vaginal area and rectum.  Hx HSV II.  States that she may have an outbreak.  She needs some antiviral medication.   Her prescription expired.  States she had some stress during Thanksgiving.  Last outbreak was in 2019 with her diagnosis of breast cancer.   No vaginal discharge.   She uses boric acid vaginal suppositories from the health food store.  She uses it usually for a week at a time.  She used it last night.   Did have some diarrhea prior to the [place] starting.   Dove soap is irritating.   States she is sensitive to toilet tissue.   GYNECOLOGIC HISTORY: No LMP recorded (lmp unknown). Patient has had a hysterectomy. Contraception:  Hyst Menopausal hormone therapy:  none Last mammogram: 11-30-19 Bil.MRI--See Epic Last pap smear: 10-31-19 Neg:Neg HR HPV, 10-23-18 Neg:Neg HR HPV, 08-10-17 ASCUS:Neg HR HPV         OB History    Gravida  2   Para  2   Term  0   Preterm  0   AB  0   Living  2     SAB  0   TAB  0   Ectopic  0   Multiple  0   Live Births  0              Patient Active Problem List   Diagnosis Date Noted  . Post-operative hypothyroidism 10/02/2019  . Neoplasm of uncertain behavior of thyroid gland 09/16/2019  . Multiple thyroid nodules 09/16/2019  . COVID-19 virus infection 02/20/2019  . Environmental allergies 01/12/2018  . Port-A-Cath in place 11/09/2017  . Malignant neoplasm of upper-outer quadrant of left breast in female, estrogen receptor positive ([facility] [place]) 08/10/2017    Past Medical History:  Diagnosis Date  . Abnormal Pap smear of vagina 07/28/2016   LGSIL; colpo 07/2016 atrophic squamous cells; colpo 07/2017 - atypia  . Allergy   . Breast cancer ([place]) 2018   Metastatic Left breast  . Elevated hemoglobin A1c 07/28/2016   level - 6.1  . Endometriosis    . GERD (gastroesophageal reflux disease)   . HSV-2 infection   . Iron deficiency anemia   . Low vitamin D level 07/28/2016   level 24.7  . Personal history of chemotherapy    finished 10/19  . Personal history of radiation therapy    finished 03/2018  . Thyroid cancer ([facility])   . Thyroid neoplasm     Past Surgical History:  Procedure Laterality Date  . ABDOMINAL HYSTERECTOMY    . BREAST BIOPSY Left 08/09/2017  . BREAST LUMPECTOMY Left 01/22/2018  . BREAST LUMPECTOMY WITH RADIOACTIVE SEED AND AXILLARY LYMPH NODE DISSECTION Left 01/22/2018   Procedure: LEFT BREAST LUMPECTOMY WITH RADIOACTIVE SEED AND COMPLETE LEFT AXILLARY LYMPH NODE DISSECTION;  Surgeon: [name], MD;  Location: [facility];  Service: General;  Laterality: Left;  . CESAREAN SECTION    . COLON SURGERY    . COLOSTOMY    . COLOSTOMY REVERSAL    . FOOT SURGERY Right    done spur  . OOPHORECTOMY    . PORT-A-CATH REMOVAL Right 08/28/2018   Procedure: REMOVAL PORT-A-CATH;  Surgeon: [name], MD;  Location: [facility];  Service: General;  Laterality: Right;  . PORTACATH PLACEMENT  N/A 09/06/2017   Procedure: INSERTION PORT-A-CATH;  Surgeon: [name], MD;  Location: [address];  Service: General;  Laterality: N/A;  . SMALL INTESTINE SURGERY    . SPINE SURGERY     Injections  . THYROIDECTOMY N/A 09/20/2019   Procedure: TOTAL THYROIDECTOMY;  Surgeon: [name], MD;  Location: [facility];  Service: General;  Laterality: N/A;    Current Outpatient Medications  Medication Sig Dispense Refill  . acetaminophen-codeine (TYLENOL #3) 300-30 MG tablet SMARTSIG:1-2 Tablet(s) By Mouth 2-3 Times Daily PRN    . acyclovir ointment (ZOVIRAX) 5 % Apply 1 application topically every 4 (four) hours. 15 g 2  . anastrozole (ARIMIDEX) 1 MG tablet TAKE 1 TABLET BY MOUTH EVERY DAY 30 tablet 14  . Artificial Tear Solution (OPTI-TEARS OP) Place 1 drop into both eyes 3 (three) times daily as needed (for  dry/irritated eyes.).    Marland Kitchen Ascorbic Acid (VITAMIN C) 100 MG CHEW Chew 100 mg by mouth daily.     Marland Kitchen BORIC ACID EX Apply topically.     . fluticasone (FLONASE) 50 MCG/ACT nasal spray Place 1 spray into both nostrils daily as needed for allergies. 11.1 mL 0  . levothyroxine (SYNTHROID) 125 MCG tablet Take 1 tablet (125 mcg total) by mouth daily. 90 tablet 3  . Misc Natural Products (OSTEO BI-FLEX ADV TRIPLE ST PO) Take 1 tablet by mouth daily after lunch.     . Multiple Vitamin (MULTIVITAMIN WITH MINERALS) TABS tablet Take 1 tablet by mouth daily after lunch.    . Multiple Vitamins-Minerals (VITAMIN D3 COMPLETE PO) Take 1 tablet by mouth daily.     . naproxen sodium (ALEVE) 220 MG tablet Take 220 mg by mouth 2 (two) times daily as needed (FOR PAIN.).     Marland Kitchen Probiotic Product (ALIGN PO) Take by mouth.     . tetrahydrozoline (VISINE) 0.05 % ophthalmic solution Place 1 drop into both eyes 3 (three) times daily as needed (for dry eyes.).      No current facility-administered medications for this visit.     ALLERGIES: Bee venom, Tramadol, and Penicillins  Family History  Problem Relation Age of Onset  . Mental illness Mother   . Alcoholism Mother   . Cirrhosis Mother   . Cancer Father   . Lung cancer Father     Social History   Socioeconomic History  . Marital status: Single    Spouse name: Not on file  . Number of children: 2  . Years of education: Not on file  . Highest education level: Not on file  Occupational History  . Not on file  Tobacco Use  . Smoking status: Never Smoker  . Smokeless tobacco: Never Used  Vaping Use  . Vaping Use: Never used  Substance and Sexual Activity  . Alcohol use: Yes    Alcohol/week: 1.0 standard drink    Types: 1 Glasses of wine per week  . Drug use: Not Currently    Types: Marijuana    Comment: everyday  . Sexual activity: Not Currently    Birth control/protection: Post-menopausal, Surgical    Comment: Hysterectomy  Other Topics Concern   . Not on file  Social History Narrative   Retired from [employer] -- retiring next April   Has children    Social Determinants of Radio broadcast assistant Strain:   . Difficulty of Paying Living Expenses: Not on file  Food Insecurity:   . Worried About Charity fundraiser in  the Last Year: Not on file  . Ran Out of Food in the Last Year: Not on file  Transportation Needs:   . Lack of Transportation (Medical): Not on file  . Lack of Transportation (Non-Medical): Not on file  Physical Activity:   . Days of Exercise per Week: Not on file  . Minutes of Exercise per Session: Not on file  Stress:   . Feeling of Stress : Not on file  Social Connections:   . Frequency of Communication with Friends and Family: Not on file  . Frequency of Social Gatherings with Friends and Family: Not on file  . Attends Religious Services: Not on file  . Active Member of Clubs or Organizations: Not on file  . Attends Archivist Meetings: Not on file  . Marital Status: Not on file  Intimate Partner Violence:   . Fear of Current or Ex-Partner: Not on file  . Emotionally Abused: Not on file  . Physically Abused: Not on file  . Sexually Abused: Not on file    Review of Systems  All other systems reviewed and are negative.   PHYSICAL EXAMINATION:    BP 138/76 (Cuff Size: Large)   Pulse 68   Ht 5\' 8"  (1.727 m)   Wt 205 lb (93 kg)   LMP  (LMP Unknown)   SpO2 99%   BMI 31.17 kg/m     General appearance: alert, cooperative and appears stated age   Pelvic: External genitalia:  no lesions              Urethra:  normal appearing urethra with no masses, tenderness or lesions              Bartholins and Skenes: normal                 Vagina: normal appearing vagina with normal color and discharge, no lesions              Cervix: absent                Bimanual Exam:  Uterus:  absent              Adnexa: no mass, fullness, tenderness            Right buttock:  Shallow 4  mm ulcer.  No surrounding erythema.    Anus:  Small amount of fecal soiling.   Chaperone was present for exam.  ASSESSMENT  HSV II outbreak.  Recent diarrhea.  No evidence of secondary bacterial infection of the skin.   PLAN  We discussed antiviral medications:  Acyclovir and Valtrex as treatment for an outbreak and for prophylaxis.  She will start Valtrex 500 mg po bid x 3 days as needed.  Instructed in use and potential side effects.  Rx for lidocaine ointment to use tid prn.  Start using aloe wipes for rectal cleaning.  Fu prn.   20 min  total time was spent for this patient encounter, including preparation, face-to-face counseling with the patient, coordination of care, and documentation of the encounter.

## 2020-01-30 NOTE — Progress Notes (Signed)
Subjective:   Patient ID: Madison Hopkins, female   DOB: 64 y.o.   MRN: [id number]   HPI Patient presents with mass plantar aspect left foot and states while its not been real tender she is just concerned about it whether or not ultimately she needs to have something done.  She has never had MRI done   ROS      Objective:  Physical Exam  Neurovascular status intact with patient found to have a large mass plantar aspect left arch that is not changed appreciably from 2019 and measures approximately 4.5 x 4.5 cm.  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  Discussed MRI she denies wanting at this time stating its been the present for over 20 years and she is not interested in seeing that and is also asking about resection.  Today I did anesthetize the area I tried to aspirate was not able to get anything out and I discussed excision but I think would be a very difficult procedure and I did consult Dr. Sherryle Lis who looked at the patient with me and agrees with me on this.  Patient will be seen back if she decides on excision and we may decide whether or not it would be appropriate or whether it is possible we can do it  X-rays indicate no signs of calcification or expansile formation into the bone structure

## 2020-02-02 LAB — COLOGUARD: COLOGUARD: NEGATIVE

## 2020-02-04 ENCOUNTER — Encounter: Payer: Self-pay | Admitting: Physician Assistant

## 2020-02-10 ENCOUNTER — Other Ambulatory Visit: Payer: Self-pay | Admitting: Obstetrics and Gynecology

## 2020-02-18 ENCOUNTER — Encounter: Payer: Self-pay | Admitting: Physician Assistant

## 2020-02-18 ENCOUNTER — Telehealth (INDEPENDENT_AMBULATORY_CARE_PROVIDER_SITE_OTHER): Payer: 59 | Admitting: Physician Assistant

## 2020-02-18 VITALS — Temp 97.8°F | Ht 68.0 in | Wt 206.0 lb

## 2020-02-18 DIAGNOSIS — R0981 Nasal congestion: Secondary | ICD-10-CM | POA: Diagnosis not present

## 2020-02-18 MED ORDER — DOXYCYCLINE HYCLATE 100 MG PO TABS: 100.0000 mg | ORAL_TABLET | Freq: Two times a day (BID) | ORAL | 0 refills | Status: DC

## 2020-02-18 MED ORDER — FLUTICASONE PROPIONATE 50 MCG/ACT NA SUSP: 2.0000 | Freq: Every day | NASAL | 2 refills | Status: DC

## 2020-02-18 NOTE — Progress Notes (Signed)
Virtual Visit via Video   I connected with Madison Hopkins on 02/18/20 at  4:00 PM EST by a video enabled telemedicine application and verified that I am speaking with the correct person using two identifiers. Location patient: Home Location provider: [facility], Office Persons participating in the virtual visit: Tvisha J Borquez, Zaniah Titterington PA-C, Anselmo Pickler, LPN   I discussed the limitations of evaluation and management by telemedicine and the availability of in person appointments. The patient expressed understanding and agreed to proceed.  I acted as a Education administrator for [employer], [employer], LPN   Subjective:   HPI:   Sinus problem Pt c/o sinus pressure and headaches x 1 week. Having clear nasal drainage, using neti pot. Denies fever, chills, cough, SOB, chest pain, loss of taste/smell. Using OTC nasal spray, sinus congestion plus.  Booster was done on Sunday. Has had COVID in the past.  ROS: See pertinent positives and negatives per HPI.  Patient Active Problem List   Diagnosis Date Noted  . Post-operative hypothyroidism 10/02/2019  . Neoplasm of uncertain behavior of thyroid gland 09/16/2019  . Multiple thyroid nodules 09/16/2019  . COVID-19 virus infection 02/20/2019  . Environmental allergies 01/12/2018  . Port-A-Cath in place 11/09/2017  . Malignant neoplasm of upper-outer quadrant of left breast in female, estrogen receptor positive ([place]) 08/10/2017    Social History   Tobacco Use  . Smoking status: Never Smoker  . Smokeless tobacco: Never Used  Substance Use Topics  . Alcohol use: Yes    Alcohol/week: 1.0 standard drink    Types: 1 Glasses of wine per week    Current Outpatient Medications:  .  acetaminophen-codeine (TYLENOL #3) 300-30 MG tablet, SMARTSIG:1-2 Tablet(s) By Mouth 2-3 Times Daily PRN, Disp: , Rfl:  .  acyclovir ointment (ZOVIRAX) 5 %, Apply 1 application topically every 4 (four) hours., Disp: 15 g, Rfl: 2 .  anastrozole  (ARIMIDEX) 1 MG tablet, TAKE 1 TABLET BY MOUTH EVERY DAY, Disp: 30 tablet, Rfl: 14 .  Artificial Tear Solution (OPTI-TEARS OP), Place 1 drop into both eyes 3 (three) times daily as needed (for dry/irritated eyes.)., Disp: , Rfl:  .  Ascorbic Acid (VITAMIN C) 100 MG CHEW, Chew 100 mg by mouth daily. , Disp: , Rfl:  .  BORIC ACID EX, Apply topically. , Disp: , Rfl:  .  fluticasone (FLONASE) 50 MCG/ACT nasal spray, Place 1 spray into both nostrils daily as needed for allergies., Disp: 11.1 mL, Rfl: 0 .  levothyroxine (SYNTHROID) 125 MCG tablet, Take 1 tablet (125 mcg total) by mouth daily., Disp: 90 tablet, Rfl: 3 .  lidocaine (XYLOCAINE) 5 % ointment, Apply 1 application topically 3 (three) times daily. Uses as needed., Disp: 1.25 g, Rfl: 1 .  Misc Natural Products (OSTEO BI-FLEX ADV TRIPLE ST PO), Take 1 tablet by mouth daily after lunch. , Disp: , Rfl:  .  Multiple Vitamin (MULTIVITAMIN WITH MINERALS) TABS tablet, Take 1 tablet by mouth daily after lunch., Disp: , Rfl:  .  Multiple Vitamins-Minerals (VITAMIN D3 COMPLETE PO), Take 1 tablet by mouth daily. , Disp: , Rfl:  .  naproxen sodium (ALEVE) 220 MG tablet, Take 220 mg by mouth 2 (two) times daily as needed (FOR PAIN.). , Disp: , Rfl:  .  Probiotic Product (ALIGN PO), Take by mouth. , Disp: , Rfl:  .  tetrahydrozoline 0.05 % ophthalmic solution, Place 1 drop into both eyes 3 (three) times daily as needed (for dry eyes.). , Disp: ,  Rfl:  .  valACYclovir (VALTREX) 500 MG tablet, Take 1 tablet (500 mg total) by mouth 2 (two) times daily. Take for three days as needed., Disp: 30 tablet, Rfl: 1 .  doxycycline (VIBRA-TABS) 100 MG tablet, Take 1 tablet (100 mg total) by mouth 2 (two) times daily., Disp: 20 tablet, Rfl: 0 .  fluticasone (FLONASE) 50 MCG/ACT nasal spray, Place 2 sprays into both nostrils daily., Disp: 16 g, Rfl: 2  Allergies  Allergen Reactions  . Bee Venom   . Tramadol Nausea Only  . Penicillins Nausea Only    Has patient had a PCN  reaction causing immediate rash, facial/tongue/throat swelling, SOB or lightheadedness with hypotension: No Has patient had a PCN reaction causing severe rash involving mucus membranes or skin necrosis: No Has patient had a PCN reaction that required hospitalization: No Has patient had a PCN reaction occurring within the last 10 years: Yes--nausea & headache ONLY If all of the above answers are "NO", then may proceed with Cephalosporin use.     Objective:   VITALS: Per patient if applicable, see vitals. GENERAL: Alert, appears well and in no acute distress. HEENT: Atraumatic, conjunctiva clear, no obvious abnormalities on inspection of external nose and ears. NECK: Normal movements of the head and neck. CARDIOPULMONARY: No increased WOB. Speaking in clear sentences. I:E ratio WNL.  MS: Moves all visible extremities without noticeable abnormality. PSYCH: Pleasant and cooperative, well-groomed. Speech normal rate and rhythm. Affect is appropriate. Insight and judgement are appropriate. Attention is focused, linear, and appropriate.  NEURO: CN grossly intact. Oriented as arrived to appointment on time with no prompting. Moves both UE equally.  SKIN: No obvious lesions, wounds, erythema, or cyanosis noted on face or hands.  Assessment and Plan:   Abbagayle was seen today for sinus problem.  Diagnoses and all orders for this visit:  Sinus congestion  Other orders -     doxycycline (VIBRA-TABS) 100 MG tablet; Take 1 tablet (100 mg total) by mouth 2 (two) times daily. -     fluticasone (FLONASE) 50 MCG/ACT nasal spray; Place 2 sprays into both nostrils daily.   No red flags on discussion, patient is not in any obvious distress during our visit. Discussed progression of most viral illness, and recommended supportive care at this point in time. Did refill her flonase. I did however provide pocket rx for oral doxycycline should symptoms not improve as anticipated. Discussed over the counter  supportive care options, with recommendations to push fluids and rest. Reviewed return precautions including new/worsening fever, SOB, new/worsening cough or other concerns.  Recommended need to self-quarantine and practice social distancing until symptoms resolve. Discussed current recommendations for COVID testing -- she declines this at this time. I recommend that patient follow-up if symptoms worsen or persist despite treatment x 7-10 days, sooner if needed.  I discussed the assessment and treatment plan with the patient. The patient was provided an opportunity to ask questions and all were answered. The patient agreed with the plan and demonstrated an understanding of the instructions.   The patient was advised to call back or seek an in-person evaluation if the symptoms worsen or if the condition fails to improve as anticipated.   CMA or LPN served as scribe during this visit. History, Physical, and Plan performed by medical provider. The above documentation has been reviewed and is accurate and complete.   [place], [place] 02/18/2020

## 2020-02-19 ENCOUNTER — Encounter: Payer: Self-pay | Admitting: Physician Assistant

## 2020-03-06 ENCOUNTER — Ambulatory Visit: Payer: 59 | Admitting: Family Medicine

## 2020-03-28 IMAGING — MG DIGITAL DIAGNOSTIC BILATERAL MAMMOGRAM WITH TOMO AND CAD
6 of 10 series · 6 of 30 positions shown · non-contrast
Comparison: Previous exam(s).

CLINICAL DATA: 62-year-old female presenting for evaluation of a
palpable left axillary mass.

EXAM:
DIGITAL DIAGNOSTIC BILATERAL MAMMOGRAM WITH CAD AND TOMO
LEFT BREAST ULTRASOUND

[L TAN synth-2D (1 of 2)]
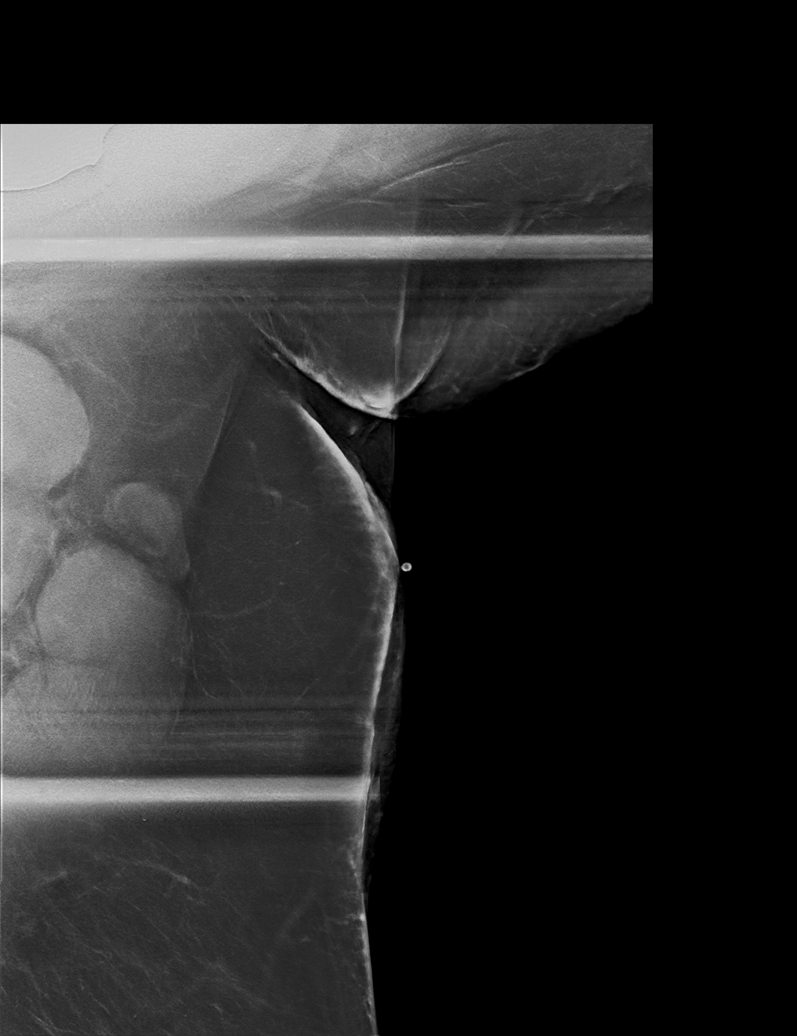

[L MLO synth-2D]
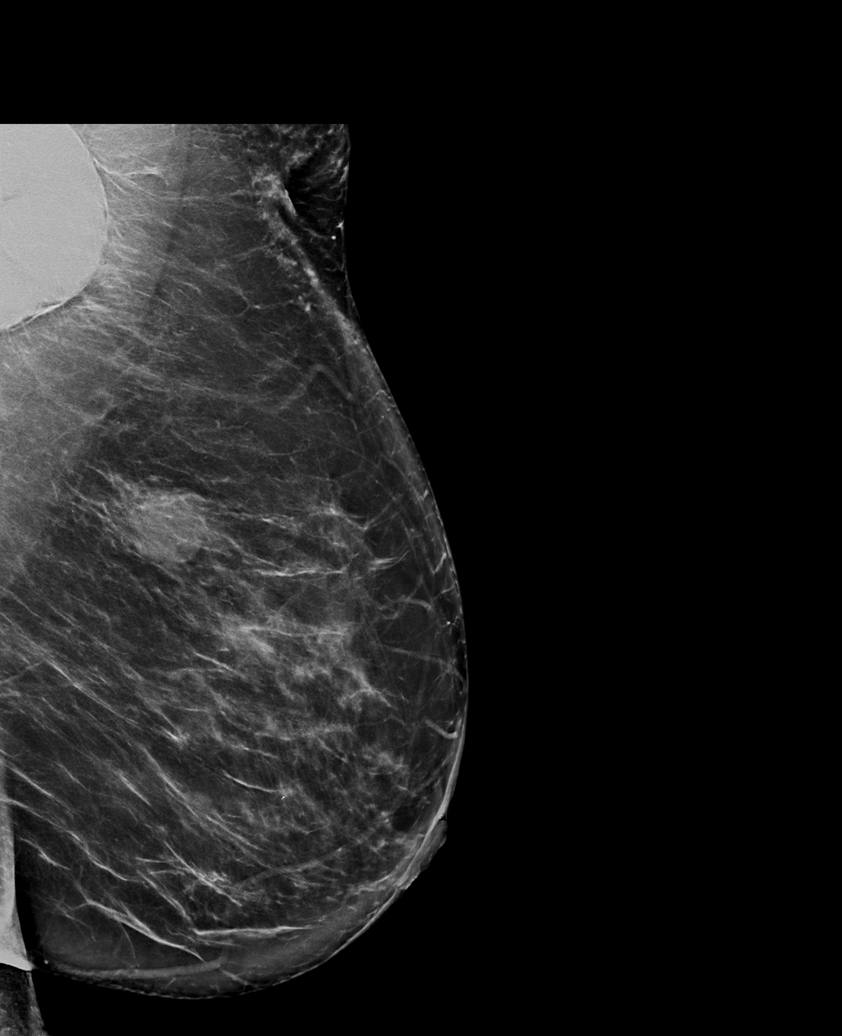

[L TAN synth-2D (2 of 2)]
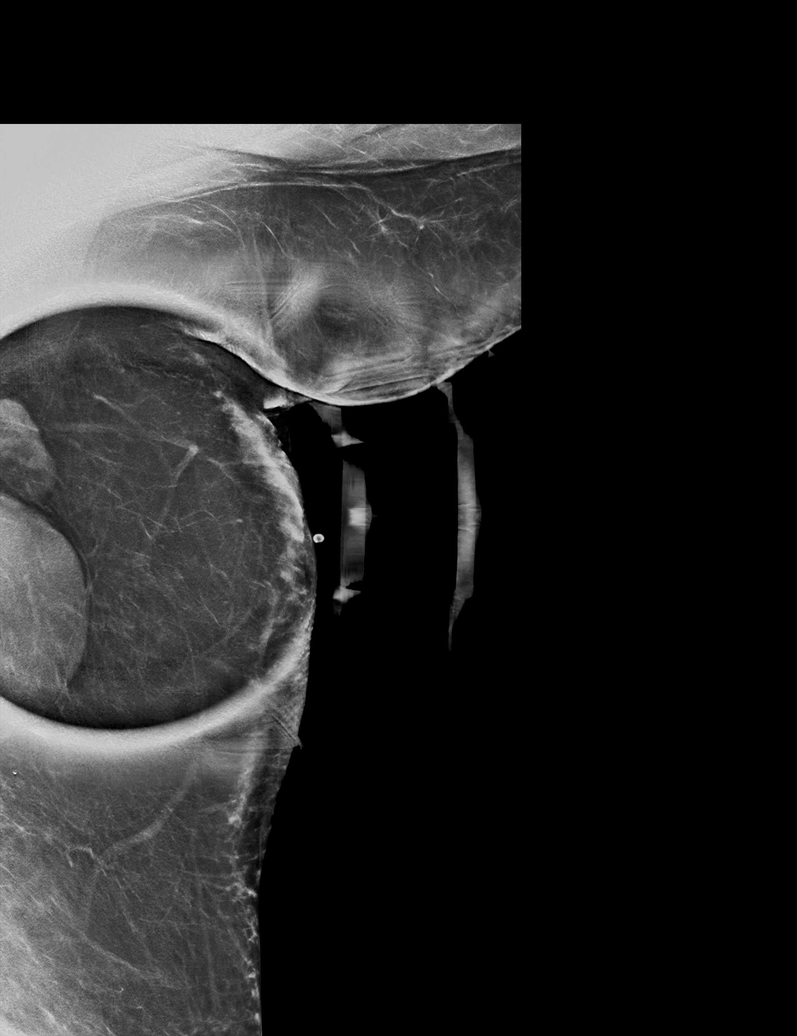

[L CC synth-2D]
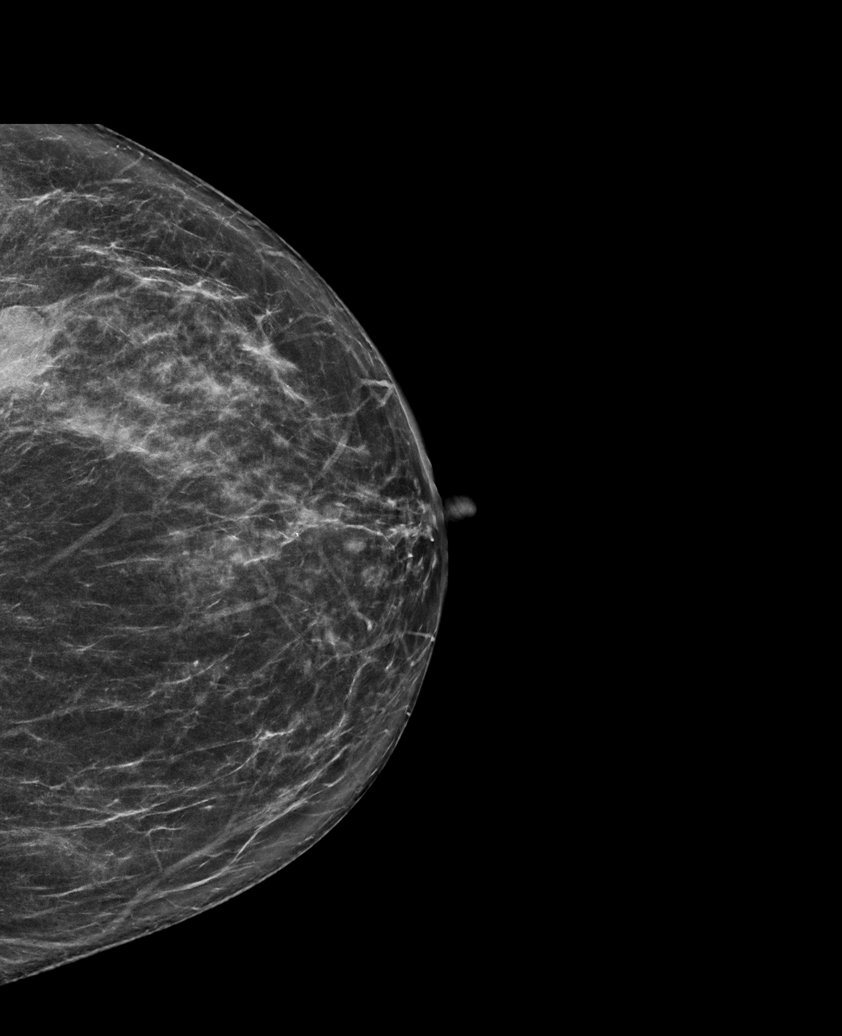

[L XCCL synth-2D]
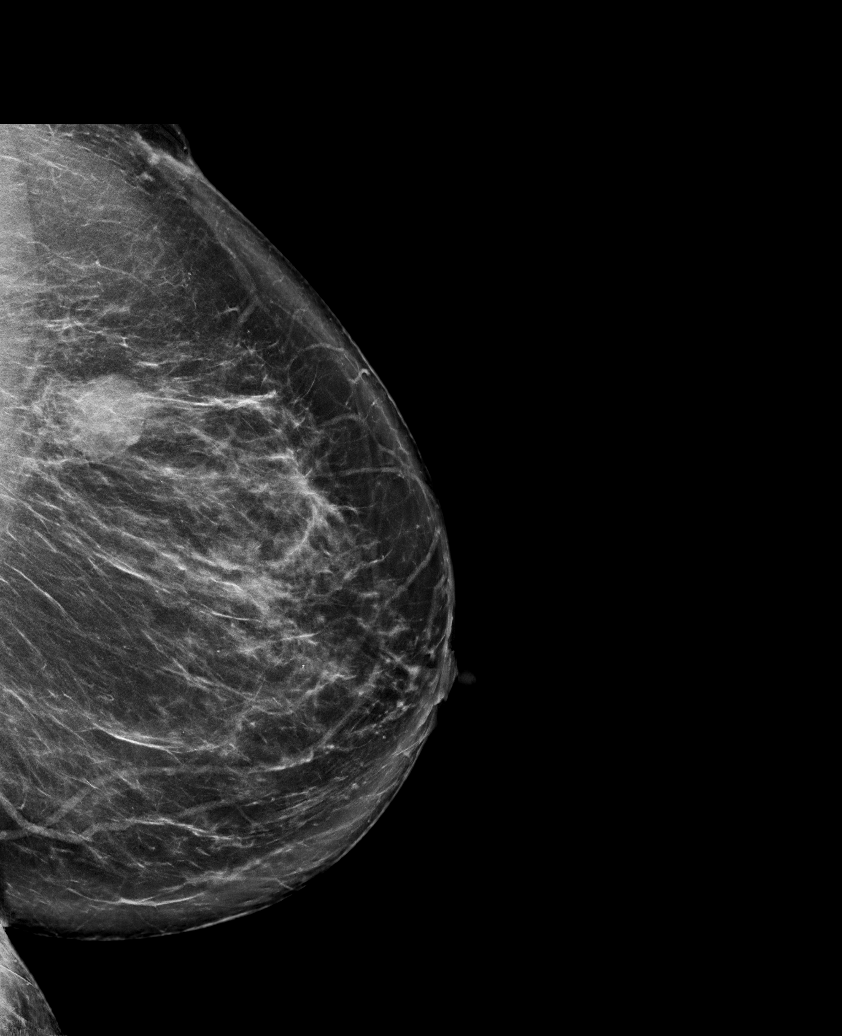

[L TAN tomo · tomo slice 62/123.0]
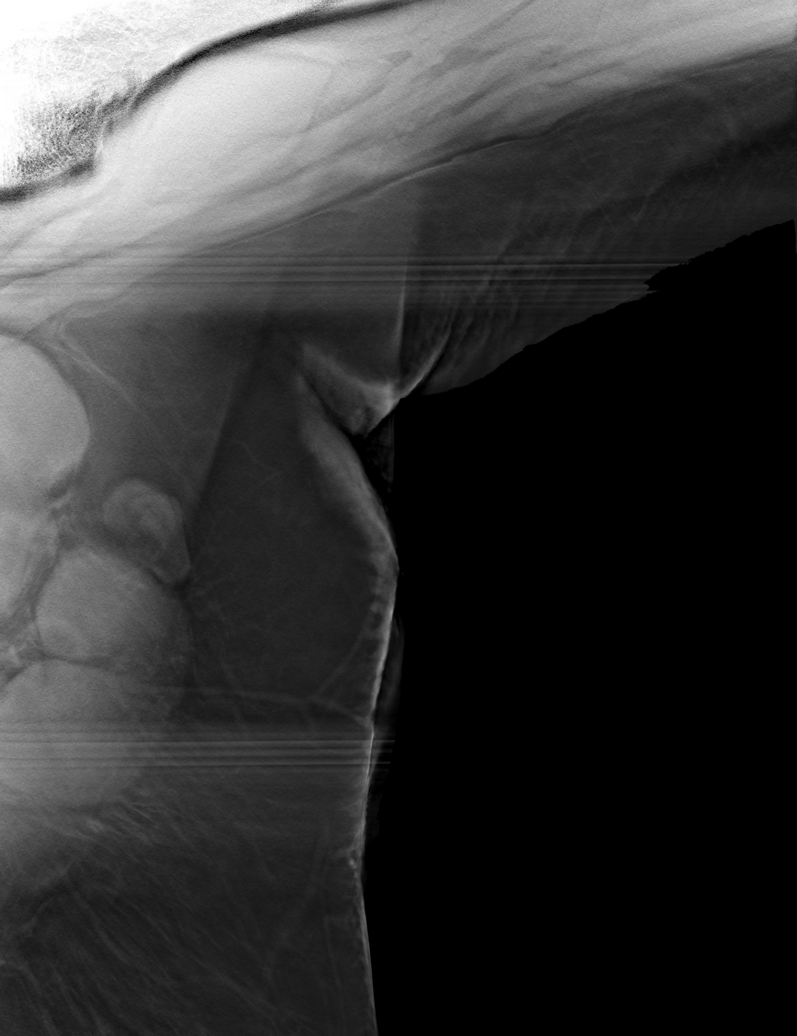

[6 of 30 positions shown; findings below may reference images not displayed]

ACR Breast Density Category b: There are scattered areas of
fibroglandular density.
FINDINGS: In the upper-outer quadrant of the left breast there is an oval mass
with indistinct margins measuring approximately 2 cm. Partially
visualized in the left axilla, a deep to the palpable marker are at
least 2 abnormal left axillary lymph nodes. No other suspicious
calcifications, masses or areas of distortion are seen in the
bilateral breasts.

Mammographic images were processed with CAD.

Physical exam of the upper-outer left breast demonstrates a palpable
mass at approximately 2 o'clock. Superior to this, into the axilla
is a large tender palpable mass which corresponds with the area of
concern identified by the patient.

Ultrasound of the left breast at 2 o'clock, 2 cm from the nipple
demonstrates an oval hypoechoic mass with indistinct margins
measuring 2.3 x 1.6 x 2.2 cm. Two markedly enlarged lymph nodes are
seen in the left axilla, the largest with a cortical thickness of at
least 2.5 cm.
IMPRESSION: 1. There is a highly suspicious mass in the left breast at 2 o'clock
measuring 2.3 cm.

2. There are 2 markedly enlarged abnormal left axillary lymph nodes
at the palpable site in the left axilla.

3.  No mammographic evidence of malignancy in the right breast.

RECOMMENDATION:
Ultrasound guided biopsy is recommended for the left breast mass and
1 of the abnormal left axillary lymph nodes. This procedure has been
scheduled for 08/07/2017 at [DATE] p.m..

I have discussed the findings and recommendations with the patient.
Results were also provided in writing at the conclusion of the
visit. If applicable, a reminder letter will be sent to the patient
regarding the next appointment.

BI-RADS CATEGORY  5: Highly suggestive of malignancy.

## 2020-04-07 ENCOUNTER — Other Ambulatory Visit: Payer: Self-pay

## 2020-04-07 ENCOUNTER — Encounter: Payer: Self-pay | Admitting: Internal Medicine

## 2020-04-07 ENCOUNTER — Ambulatory Visit: Payer: 59 | Admitting: Internal Medicine

## 2020-04-07 VITALS — BP 138/86 | HR 66 | Ht 68.0 in | Wt 205.2 lb

## 2020-04-07 DIAGNOSIS — C73 Malignant neoplasm of thyroid gland: Secondary | ICD-10-CM | POA: Insufficient documentation

## 2020-04-07 DIAGNOSIS — E890 Postprocedural hypothyroidism: Secondary | ICD-10-CM | POA: Diagnosis not present

## 2020-04-07 NOTE — Patient Instructions (Signed)

## 2020-04-07 NOTE — Progress Notes (Signed)
Name: Madison Hopkins  MRN/ DOB: [id number], 1955-04-29    Age/ Sex: 65 y.o., female     PCP: [name], PA   Reason for Endocrinology Evaluation: [facility]      Initial Endocrinology Clinic Visit: 10/02/2019    PATIENT IDENTIFIER: Madison Hopkins is a 65 y.o., female with a past medical history of Breast Ca and PTC . She has followed with [facility] since 10/02/2019 for consultative assistance with management of her PTC.   HISTORICAL SUMMARY:   Pt has been referred by Dr. [name].  She had an incidental finding of a left thyroid nodule ~1.5 cm in diameter on neck MRI during evaluation of chronic neck pain.  This nodule was noted to enlarge to 2.3 cm max diameter on cervical spine MRI 04/2019.  She is S/P FNA in 06/2019 - with left mid pole cytology report of Suspicious for malignancy (Bethesda category V), no afirma found for this in the chart. And a left inferior pole cytology of Atypia of undetermined significance (Bethesda category III) , with benign afirma   She is S/P Total thyroidectomy  On 7/23rd/2021 with a 1.3 cm papillary thyroid carcinoma on the left with no invasion, free margins and no L.N submitted.    SUBJECTIVE:     Today (04/07/2020):  Madison Hopkins is here for total thyroidectomy secondary to PTC.    Weight has been stable  Denies diarrhea or palpitations  Denies tremors  Has occasional neck swelling   Levothyroxine 125 mcg    HISTORY:  Past Medical History:  Past Medical History:  Diagnosis Date  . Abnormal Pap smear of vagina 07/28/2016   LGSIL; colpo 07/2016 atrophic squamous cells; colpo 07/2017 - atypia  . Allergy   . Breast cancer ([place]) 2018   Metastatic Left breast  . Elevated hemoglobin A1c 07/28/2016   level - 6.1  . Endometriosis   . GERD (gastroesophageal reflux disease)   . HSV-2 infection   . Iron deficiency anemia   . Low vitamin D level 07/28/2016   level 24.7  . Personal history of chemotherapy     finished 10/19  . Personal history of radiation therapy    finished 03/2018  . Thyroid cancer ([place])   . Thyroid neoplasm    Past Surgical History:  Past Surgical History:  Procedure Laterality Date  . ABDOMINAL HYSTERECTOMY    . BREAST BIOPSY Left 08/09/2017  . BREAST LUMPECTOMY Left 01/22/2018  . BREAST LUMPECTOMY WITH RADIOACTIVE SEED AND AXILLARY LYMPH NODE DISSECTION Left 01/22/2018   Procedure: LEFT BREAST LUMPECTOMY WITH RADIOACTIVE SEED AND COMPLETE LEFT AXILLARY LYMPH NODE DISSECTION;  Surgeon: [name], MD;  Location: [facility];  Service: General;  Laterality: Left;  . CESAREAN SECTION    . COLON SURGERY    . COLOSTOMY    . COLOSTOMY REVERSAL    . FOOT SURGERY Right    done spur  . OOPHORECTOMY    . PORT-A-CATH REMOVAL Right 08/28/2018   Procedure: REMOVAL PORT-A-CATH;  Surgeon: [name], MD;  Location: [facility];  Service: General;  Laterality: Right;  . PORTACATH PLACEMENT N/A 09/06/2017   Procedure: INSERTION PORT-A-CATH;  Surgeon: [name], MD;  Location: [facility];  Service: General;  Laterality: N/A;  . SMALL INTESTINE SURGERY    . SPINE SURGERY     Injections  . THYROIDECTOMY N/A 09/20/2019   Procedure: TOTAL THYROIDECTOMY;  Surgeon: [name], MD;  [facility];  Service: General;  Laterality: N/A;    Social History:  reports that she has never smoked. She has never used smokeless tobacco. She reports current alcohol use of about 1.0 standard drink of alcohol per week. She reports previous drug use. Drug: Marijuana. Family History:  Family History  Problem Relation Age of Onset  . Mental illness Mother   . Alcoholism Mother   . Cirrhosis Mother   . Cancer Father   . Lung cancer Father      HOME MEDICATIONS: Allergies as of 04/07/2020      Reactions   Bee Venom    Tramadol Nausea Only   Penicillins Nausea Only   Has patient had a PCN reaction causing immediate rash, facial/tongue/throat swelling, SOB or  lightheadedness with hypotension: No Has patient had a PCN reaction causing severe rash involving mucus membranes or skin necrosis: No Has patient had a PCN reaction that required hospitalization: No Has patient had a PCN reaction occurring within the last 10 years: Yes--nausea & headache ONLY If all of the above answers are "NO", then may proceed with Cephalosporin use.      Medication List       Accurate as of April 07, 2020  4:00 PM. If you have any questions, ask your nurse or doctor.        STOP taking these medications   acyclovir ointment 5 % Commonly known as: ZOVIRAX Stopped by: Scarlette Shorts, MD   doxycycline 100 MG tablet Commonly known as: VIBRA-TABS Stopped by: Scarlette Shorts, MD   OSTEO BI-FLEX ADV TRIPLE ST PO Stopped by: Scarlette Shorts, MD   tetrahydrozoline 0.05 % ophthalmic solution Stopped by: Scarlette Shorts, MD     TAKE these medications   acetaminophen-codeine 300-30 MG tablet Commonly known as: TYLENOL #3 SMARTSIG:1-2 Tablet(s) By Mouth 2-3 Times Daily PRN   ALIGN PO Take by mouth.   anastrozole 1 MG tablet Commonly known as: ARIMIDEX TAKE 1 TABLET BY MOUTH EVERY DAY   BORIC ACID EX Apply topically.   fluticasone 50 MCG/ACT nasal spray Commonly known as: FLONASE Place 2 sprays into both nostrils daily.   levothyroxine 125 MCG tablet Commonly known as: SYNTHROID Take 1 tablet (125 mcg total) by mouth daily.   lidocaine 5 % ointment Commonly known as: XYLOCAINE Apply 1 application topically 3 (three) times daily. Uses as needed.   naproxen sodium 220 MG tablet Commonly known as: ALEVE Take 220 mg by mouth 2 (two) times daily as needed (FOR PAIN.).   OPTI-TEARS OP Place 1 drop into both eyes 3 (three) times daily as needed (for dry/irritated eyes.).   valACYclovir 500 MG tablet Commonly known as: VALTREX Take 1 tablet (500 mg total) by mouth 2 (two) times daily. Take for three days as needed.   Vitamin C  100 MG Chew Chew 100 mg by mouth daily.   VITAMIN D3 COMPLETE PO Take 1 tablet by mouth daily.         OBJECTIVE:   PHYSICAL EXAM: VS: BP 138/86   Pulse 66   Ht 5\' 8"  (1.727 m)   Wt 205 lb 4 oz (93.1 kg)   LMP  (LMP Unknown)   SpO2 98%   BMI 31.21 kg/m    EXAM: General: Pt appears well and is in NAD  Neck: General: Supple without adenopathy. Thyroid: Surgically removed . No goiter or nodules appreciated.  Lungs: Clear with good BS bilat with no rales, rhonchi, or wheezes  Heart: Auscultation: RRR.  Abdomen: Normoactive bowel sounds,  soft, nontender, without masses or organomegaly palpable  Extremities:  BL LE: No pretibial edema normal ROM and strength.  Mental Status: Judgment, insight: Intact Orientation: Oriented to time, place, and person Mood and affect: No depression, anxiety, or agitation     DATA REVIEWED: Results for Madison Hopkins, Madison Hopkins (MRN [id number]) as of 04/08/2020 12:11  Ref. Range 01/14/2020 08:52  Sodium Latest Ref Range: 135 - 146 mmol/L 142  Potassium Latest Ref Range: 3.5 - 5.3 mmol/L 4.1  Chloride Latest Ref Range: 98 - 110 mmol/L 108  CO2 Latest Ref Range: 20 - 32 mmol/L 23  Glucose Latest Ref Range: 65 - 99 mg/dL 106 (H)  Mean Plasma Glucose Latest Units: (calc) 140  BUN Latest Ref Range: 7 - 25 mg/dL 14  Creatinine Latest Ref Range: 0.50 - 0.99 mg/dL 0.90  Calcium Latest Ref Range: 8.6 - 10.4 mg/dL 10.2  BUN/Creatinine Ratio Latest Ref Range: 6 - 22 (calc) NOT APPLICABLE  AG Ratio Latest Ref Range: 1.0 - 2.5 (calc) 1.5  AST Latest Ref Range: 10 - 35 U/L 14  ALT Latest Ref Range: 6 - 29 U/L 17  Total Protein Latest Ref Range: 6.1 - 8.1 g/dL 7.1  Total Bilirubin Latest Ref Range: 0.2 - 1.2 mg/dL 0.5  Alkaline phosphatase (APISO) Latest Ref Range: 37 - 153 U/L 140  Globulin Latest Ref Range: 1.9 - 3.7 g/dL (calc) 2.8  WBC Latest Ref Range: 3.8 - 10.8 Thousand/uL 5.8  RBC Latest Ref Range: 3.80 - 5.10 Million/uL 4.62  Hemoglobin Latest Ref  Range: 11.7 - 15.5 g/dL 12.3  HCT Latest Ref Range: 35.0 - 45.0 % 38.1  MCV Latest Ref Range: 80.0 - 100.0 fL 82.5  MCH Latest Ref Range: 27.0 - 33.0 pg 26.6 (L)  MCHC Latest Ref Range: 32.0 - 36.0 g/dL 32.3  RDW Latest Ref Range: 11.0 - 15.0 % 13.1  Platelets Latest Ref Range: 140 - 400 Thousand/uL 345  MPV Latest Ref Range: 7.5 - 12.5 fL 10.3  Neutrophils Latest Units: % 51.7  Monocytes Relative Latest Units: % 6.2  Eosinophil Latest Units: % 2.4  Basophil Latest Units: % 0.7  NEUT# Latest Ref Range: 1,500 - 7,800 cells/uL 2,999  Lymphocyte # Latest Ref Range: 850 - 3,900 cells/uL 2,262  Total Lymphocyte Latest Units: % 39.0  Eosinophils Absolute Latest Ref Range: 15 - 500 cells/uL 139  Basophils Absolute Latest Ref Range: 0 - 200 cells/uL 41  Absolute Monocytes Latest Ref Range: 200 - 950 cells/uL 360  eAG (mmol/L) Latest Units: (calc) 7.7  Hemoglobin A1C Latest Ref Range: <5.7 % of total Hgb 6.5 (H)  Albumin MSPROF Latest Ref Range: 3.6 - 5.1 g/dL 4.3    Pathology report 09/20/2019 THYROID, TOTAL THYROIDECTOMY:  - Papillary thyroid carcinoma, 1.3 cm.  - Margins not involved.  - Multiple adenomatous nodules.  - See oncology table.   ONCOLOGY TABLE:  THYROID GLAND:  Procedure: Total thyroidectomy.  Tumor Focality: Unifocal.  Tumor Site: Left lobe, mid.  Tumor Size: 1.3 x 1 x 1 cm.  Histologic Type: Papillary thyroid carcinoma.  Margins: Free of tumor.  Angioinvasion: Not identified.  Lymphatic Invasion: Not identified.  Extrathyroidal extension: Not identified.  Regional Lymph Nodes: No lymph nodes submitted.  Pathologic Stage Classification (pTNM, AJCC 8th Edition): pT1b, pNX   ASSESSMENT / PLAN / RECOMMENDATIONS:   1. Hx of papillary Thyroid carcinoma (pT1b, pNX) Stage I:   - S/P total thyroidectomy 09/20/2019 - She is at low risk for recurrent based on pathology report  -  No indication for RAI ablation at this time - TSH goal 0.1-0.5 uIU/mL for now  - Will  order a thyroid bed ultrasound  - Pt with excellent biochemical response Tg, Tg Ab undetectable    2. Post-operative Hypothyroidism    - She is clinically euthyroid  - Pt educated extensively on the correct way to take levothyroxine (first thing in the morning with water, 30 minutes before eating or taking other medications). - Pt encouraged to double dose the following day if she were to miss a dose given long half-life of levothyroxine.    Medications  -  Decrease Levothyroxine 125 mcg to half a tablet on Sundays and 1 tablet rest of the week    F/U in 6 months    Signed electronically by: Mack Guise, MD  [facility]  [address] [place], [place] [address] Phone: [phone] FAX: [phone]      CC: [name], [place] [address] Phone: [phone]  Fax: [phone]   Return to Endocrinology clinic as below: Future Appointments  Date Time Provider [place]  04/13/2020  3:00 PM Wynelle Beckmann, [place], [place] [facility] None  06/11/2020  3:30 PM Gardenia Phlegm, NP [facility] None  11/05/2020  1:30 PM Nunzio Cobbs, MD [facility] None  12/10/2020 12:30 PM [facility] LAB [facility] None  12/10/2020  1:00 PM [name], [name] Dad, MD [facility] None

## 2020-04-08 LAB — THYROGLOBULIN LEVEL: Thyroglobulin: 0.1 ng/mL — ABNORMAL LOW

## 2020-04-08 LAB — THYROGLOBULIN ANTIBODY: Thyroglobulin Ab: <1 IU/mL (ref <=1)

## 2020-04-08 LAB — TSH: TSH: 0.03 u[IU]/mL — ABNORMAL LOW (ref 0.35–4.50)

## 2020-04-13 ENCOUNTER — Ambulatory Visit (HOSPITAL_BASED_OUTPATIENT_CLINIC_OR_DEPARTMENT_OTHER)
Admission: RE | Admit: 2020-04-13 | Discharge: 2020-04-13 | Disposition: A | Discharge status: Home / Self Care | Payer: 59 | Source: Ambulatory Visit | Attending: Internal Medicine | Admitting: Internal Medicine

## 2020-04-13 ENCOUNTER — Ambulatory Visit: Payer: 59 | Attending: General Surgery | Admitting: Physical Therapy

## 2020-04-13 ENCOUNTER — Encounter: Payer: Self-pay | Admitting: Physical Therapy

## 2020-04-13 ENCOUNTER — Other Ambulatory Visit: Payer: Self-pay

## 2020-04-13 DIAGNOSIS — M25612 Stiffness of left shoulder, not elsewhere classified: Secondary | ICD-10-CM | POA: Diagnosis present

## 2020-04-13 DIAGNOSIS — C73 Malignant neoplasm of thyroid gland: Secondary | ICD-10-CM | POA: Diagnosis not present

## 2020-04-13 DIAGNOSIS — C50912 Malignant neoplasm of unspecified site of left female breast: Secondary | ICD-10-CM | POA: Insufficient documentation

## 2020-04-13 DIAGNOSIS — Z483 Aftercare following surgery for neoplasm: Secondary | ICD-10-CM

## 2020-04-13 DIAGNOSIS — L599 Disorder of the skin and subcutaneous tissue related to radiation, unspecified: Secondary | ICD-10-CM | POA: Diagnosis present

## 2020-04-13 DIAGNOSIS — I890 Lymphedema, not elsewhere classified: Secondary | ICD-10-CM | POA: Diagnosis not present

## 2020-04-13 NOTE — Therapy (Signed)
[place], [place], [address] Phone: [phone]   Fax:  [phone]  Physical Therapy Evaluation  Patient Details  Name: Madison Hopkins MRN: [id number] Date of Birth: [birth year] Referring Provider (PT): Dr. Barry Dienes   Encounter Date: 04/13/2020   PT End of Session - 04/13/20 1725    Visit Number 1    Number of Visits 9    Date for PT Re-Evaluation 05/11/20    PT Start Time 1503    PT Stop Time 1555    PT Time Calculation (min) 52 min    Activity Tolerance Patient tolerated treatment well    Behavior During Therapy [facility] for tasks assessed/performed           Past Medical History:  Diagnosis Date  . Abnormal Pap smear of vagina 07/28/2016   LGSIL; colpo 07/2016 atrophic squamous cells; colpo 07/2017 - atypia  . Allergy   . Breast cancer ([place]) 2018   Metastatic Left breast  . Elevated hemoglobin A1c 07/28/2016   level - 6.1  . Endometriosis   . GERD (gastroesophageal reflux disease)   . HSV-2 infection   . Iron deficiency anemia   . Low vitamin D level 07/28/2016   level 24.7  . Personal history of chemotherapy    finished 10/19  . Personal history of radiation therapy    finished 03/2018  . Thyroid cancer ([employer])   . Thyroid neoplasm     Past Surgical History:  Procedure Laterality Date  . ABDOMINAL HYSTERECTOMY    . BREAST BIOPSY Left 08/09/2017  . BREAST LUMPECTOMY Left 01/22/2018  . BREAST LUMPECTOMY WITH RADIOACTIVE SEED AND AXILLARY LYMPH NODE DISSECTION Left 01/22/2018   Procedure: LEFT BREAST LUMPECTOMY WITH RADIOACTIVE SEED AND COMPLETE LEFT AXILLARY LYMPH NODE DISSECTION;  Surgeon: [name], MD;  Location: [facility];  Service: General;  Laterality: Left;  . CESAREAN SECTION    . COLON SURGERY    . COLOSTOMY    . COLOSTOMY REVERSAL    . FOOT SURGERY Right    done spur  . OOPHORECTOMY    . PORT-A-CATH REMOVAL Right 08/28/2018   Procedure: REMOVAL  PORT-A-CATH;  Surgeon: [name], MD;  Location: [facility];  Service: General;  Laterality: Right;  . PORTACATH PLACEMENT N/A 09/06/2017   Procedure: INSERTION PORT-A-CATH;  Surgeon: [name], MD;  Location: [facility];  Service: General;  Laterality: N/A;  . SMALL INTESTINE SURGERY    . SPINE SURGERY     Injections  . THYROIDECTOMY N/A 09/20/2019   Procedure: TOTAL THYROIDECTOMY;  Surgeon: [name], MD;  [facility];  Service: General;  Laterality: N/A;    There were no vitals filed for this visit.    Subjective Assessment - 04/13/20 1505    Subjective I am having swelling in my left arm. I saw the Dr and she noticed that I had some swelling in my arm. I think the swelling started in 2020 after completion of chemo and radiation.    Pertinent History status post left breast upper outer quadrant and left axillary lymph node biopsy 08/07/2017, both positive for a T2 N1, stage IIB invasive ductal carcinoma, grade 3, estrogen receptor weakly positive, progesterone receptor negative, but HER-2 amplified, with an MIB-1 of 80%, staging chest CT and bone scan 08/30/2017 showed no evidence of metastatic disease, completed neoadjuvant chemo, Left lumpectomy on 01/22/2018 shows a ypT1b pN1a, grade 3 residual invasive ductal carcinoma, agnostic panel HER-2 negative, ER 40%  weakly positive, PR negative, total of 11 axillary lymph nodes removed (1+), adjuvant radiation completed 04/20/18, started anastrozole 05/30/2018, status post total thyroidectomy 09/20/2019 for papillary thyroid carcinoma    Patient Stated Goals get my swelling down, have my scar looked at    Currently in Pain? No/denies    Pain Score 0-No pain              [facility] PT Assessment - 04/13/20 0001      Assessment   Medical Diagnosis Lt breast cancer    Referring Provider (PT) Dr. Barry Dienes    Onset Date/Surgical Date 01/22/18    Hand Dominance Right    Prior Therapy PT in 2020 for shoulder ROM       Precautions   Precaution Comments lymphedema      Restrictions   Weight Bearing Restrictions No      Balance Screen   Has the patient fallen in the past 6 months No    Has the patient had a decrease in activity level because of a fear of falling?  No    Is the patient reluctant to leave their home because of a fear of falling?  No      Home Environment   Living Environment Private residence    Living Arrangements Alone    Available Help at Discharge Family;Friend(s)    Type of Home House      Prior Function   Level of Independence Independent    Vocation Full time employment    Vocation Requirements [employer] retiring 06/23/20    Leisure pt walks about 2x/wk, pt loves to dance      Cognition   Overall Cognitive Status Within Functional Limits for tasks assessed      Observation/Other Assessments   Observations peau de orange skin on L breast, fullness L upper arm      Posture/Postural Control   Posture/Postural Control Postural limitations    Postural Limitations Rounded Shoulders;Forward head      AROM   Right Shoulder Flexion 160 Degrees    Right Shoulder ABduction 167 Degrees    Right Shoulder Internal Rotation 58 Degrees    Right Shoulder External Rotation 80 Degrees    Left Shoulder Flexion 150 Degrees    Left Shoulder ABduction 160 Degrees   with tightness   Left Shoulder Internal Rotation 52 Degrees    Left Shoulder External Rotation 75 Degrees   pull across chest            LYMPHEDEMA/ONCOLOGY QUESTIONNAIRE - 04/13/20 0001      Right Upper Extremity Lymphedema   15 cm Proximal to Olecranon Process 32 cm    Olecranon Process 28 cm    15 cm Proximal to Ulnar Styloid Process 25.5 cm    Just Proximal to Ulnar Styloid Process 17.2 cm    Across Hand at [employer] 20.5 cm    At [place] of 2nd Digit 6.8 cm      Left Upper Extremity Lymphedema   15 cm Proximal to Olecranon Process 35.2 cm    Olecranon Process 30.8 cm    15 cm Proximal to  Ulnar Styloid Process 26.7 cm    Just Proximal to Ulnar Styloid Process 17.8 cm    Across Hand at [employer] 21 cm    At [place] of 2nd Digit 6.6 cm                   Objective measurements completed on examination:  See above findings.               PT Education - 04/13/20 1726    Education Details anatomy and physiology of lymphatic system, lymphedema, compression garments    Person(s) Educated Patient    Methods Explanation    Comprehension Verbalized understanding               PT Long Term Goals - 04/13/20 1733      PT LONG TERM GOAL #1   Title Pt will report no tightness across L chest at end range abduction to allow improved mobility.    Time 4    Period Weeks    Status New    Target Date 05/11/20      PT LONG TERM GOAL #2   Title Pt will be independent in self MLD for long term management of lymphedema.    Time 4    Period Weeks    Status New    Target Date 05/11/20      PT LONG TERM GOAL #3   Title Pt will obtain appropriate compression garments for long term management of lymphedema.    Time 4    Period Weeks    Status New    Target Date 05/11/20      PT LONG TERM GOAL #4   Title Pt will be independent in a home exercise program for continued strengthening and stretching.    Time 4    Period Weeks    Status New    Target Date 05/11/20                  Plan - 04/13/20 1727    Clinical Impression Statement Pt presents to PT with increased LUE and breast swelling. Pt reports it has been there since about 2020. She had a L lumpectomy and ALND on 01/22/18  with 1 of 11 nodes positive. she completed chemo and radiation. Her left breast has orange peel like skin with fibrosis present in medial inferior breast. She also has swelling most prominent in her L upper arm. Due to proximal swelling and discomfort will send message to pt's doctor in case she requires any imaging or to be seen. She has functional ROM of the L shoulder but  increased tightness and discomfort at end range. Pt would benefit from skilled PT services to decrease swelling in L breast and UE, teach pt self MLD, and assist pt with obtaining appropriate compression garments. Demographic info sent to DME supplier for compression garments and also to [place] for a compression pump per pt request.    Personal Factors and Comorbidities Time since onset of injury/illness/exacerbation    Stability/Clinical Decision Making Stable/Uncomplicated    Clinical Decision Making Low    Rehab Potential Excellent    PT Frequency 2x / week    PT Duration 4 weeks    PT Treatment/Interventions ADLs/Self Care Home Management;Therapeutic exercise;Manual techniques;Manual lymph drainage;Passive range of motion;Taping;Compression bandaging;Patient/family education;Scar mobilization;Vasopneumatic Device    PT Next Visit Plan begin MLD to L UE and breast, L shoulder PROM and L pec stretching    Recommended Other Services sent demographics to [place] and [facility]    Consulted and Agree with Plan of Care Patient           Patient will benefit from skilled therapeutic intervention in order to improve the following deficits and impairments:  Pain,Postural dysfunction,Increased fascial restricitons,Decreased knowledge of precautions,Decreased range of motion,Decreased scar mobility,Increased edema  Visit Diagnosis: Lymphedema, not  elsewhere classified  Stiffness of left shoulder, not elsewhere classified  Aftercare following surgery for neoplasm  Disorder of the skin and subcutaneous tissue related to radiation, unspecified  Malignant neoplasm of left female breast, unspecified estrogen receptor status, unspecified site of breast [facility])     Problem List Patient Active Problem List   Diagnosis Date Noted  . Papillary thyroid carcinoma ([place]) 04/07/2020  . Post-operative hypothyroidism 10/02/2019  . Neoplasm of uncertain behavior of thyroid gland 09/16/2019  . Multiple  thyroid nodules 09/16/2019  . COVID-19 virus infection 02/20/2019  . Environmental allergies 01/12/2018  . Port-A-Cath in place 11/09/2017  . Malignant neoplasm of upper-outer quadrant of left breast in female, estrogen receptor positive ([place]) 08/10/2017    Allyson Sabal [facility] 04/13/2020, 5:35 PM  [place], [place], [address] Phone: [phone]   Fax:  [phone]  Name: Madison Hopkins MRN: [id number] Date of Birth: 04-05-1955  Manus Gunning, PT 04/13/20 5:35 PM

## 2020-04-14 ENCOUNTER — Other Ambulatory Visit: Payer: Self-pay | Admitting: *Deleted

## 2020-04-14 DIAGNOSIS — Z170 Estrogen receptor positive status [ER+]: Secondary | ICD-10-CM

## 2020-04-14 DIAGNOSIS — C50412 Malignant neoplasm of upper-outer quadrant of left female breast: Secondary | ICD-10-CM

## 2020-04-14 NOTE — Progress Notes (Signed)
PT noted swelling in left upper extremity. Dr Jana Hakim ordered doppler.

## 2020-04-15 ENCOUNTER — Other Ambulatory Visit: Payer: Self-pay

## 2020-04-15 ENCOUNTER — Ambulatory Visit (HOSPITAL_COMMUNITY)
Admission: RE | Admit: 2020-04-15 | Discharge: 2020-04-15 | Disposition: A | Discharge status: Home / Self Care | Payer: 59 | Source: Ambulatory Visit | Attending: Oncology | Admitting: Oncology

## 2020-04-15 DIAGNOSIS — Z170 Estrogen receptor positive status [ER+]: Secondary | ICD-10-CM

## 2020-04-15 DIAGNOSIS — C50412 Malignant neoplasm of upper-outer quadrant of left female breast: Secondary | ICD-10-CM | POA: Insufficient documentation

## 2020-04-15 NOTE — Progress Notes (Signed)
Left upper extremity venous duplex has been completed. Preliminary results can be found in CV Proc through chart review.  Results were given to [facility] at Dr. [name] Dad office.  04/15/20 1:21 PM Madison Hopkins RVT

## 2020-04-15 NOTE — Progress Notes (Signed)
Report called from vascular lab. Pt negative for DVT in left arm. MD notified.

## 2020-04-21 ENCOUNTER — Other Ambulatory Visit: Payer: Self-pay

## 2020-04-21 ENCOUNTER — Encounter: Payer: Self-pay | Admitting: Rehabilitation

## 2020-04-21 ENCOUNTER — Ambulatory Visit: Payer: 59 | Admitting: Rehabilitation

## 2020-04-21 DIAGNOSIS — L599 Disorder of the skin and subcutaneous tissue related to radiation, unspecified: Secondary | ICD-10-CM

## 2020-04-21 DIAGNOSIS — M25612 Stiffness of left shoulder, not elsewhere classified: Secondary | ICD-10-CM

## 2020-04-21 DIAGNOSIS — Z483 Aftercare following surgery for neoplasm: Secondary | ICD-10-CM

## 2020-04-21 DIAGNOSIS — I890 Lymphedema, not elsewhere classified: Secondary | ICD-10-CM

## 2020-04-21 DIAGNOSIS — C50912 Malignant neoplasm of unspecified site of left female breast: Secondary | ICD-10-CM

## 2020-04-21 NOTE — Patient Instructions (Signed)
Access Code: [email or web address]  URL: [email or web address]: 02/22/2022Prepared by: Marcene Brawn TevisExercises  Doorway Pec Stretch at 90 Degrees Abduction - 1 x daily - 7 x weekly - 1-3 sets - 3 reps - 30 second hold  Standing Pec Stretch at Wall - 1 x daily - 7 x weekly - 1-3 sets - 3 reps - 20-30 seconds hold  Standing Shoulder Flexion Wall Walk - 1 x daily - 7 x weekly - 1 sets - 3 reps - 20-30 seconds hold

## 2020-04-21 NOTE — Therapy (Signed)
[place], [place], [address] Phone: [phone]   Fax:  [phone]  Physical Therapy Treatment  Patient Details  Name: Madison Hopkins MRN: [id number] Date of Birth: [birth year] Referring Provider (PT): Dr. Barry Dienes   Encounter Date: 04/21/2020   PT End of Session - 04/21/20 0946    Visit Number 2    Number of Visits 9    Date for PT Re-Evaluation 05/11/20    PT Start Time 0903    PT Stop Time 0945    PT Time Calculation (min) 42 min    Activity Tolerance Patient tolerated treatment well    Behavior During Therapy [facility] for tasks assessed/performed           Past Medical History:  Diagnosis Date  . Abnormal Pap smear of vagina 07/28/2016   LGSIL; colpo 07/2016 atrophic squamous cells; colpo 07/2017 - atypia  . Allergy   . Breast cancer ([place]) 2018   Metastatic Left breast  . Elevated hemoglobin A1c 07/28/2016   level - 6.1  . Endometriosis   . GERD (gastroesophageal reflux disease)   . HSV-2 infection   . Iron deficiency anemia   . Low vitamin D level 07/28/2016   level 24.7  . Personal history of chemotherapy    finished 10/19  . Personal history of radiation therapy    finished 03/2018  . Thyroid cancer ([place])   . Thyroid neoplasm     Past Surgical History:  Procedure Laterality Date  . ABDOMINAL HYSTERECTOMY    . BREAST BIOPSY Left 08/09/2017  . BREAST LUMPECTOMY Left 01/22/2018  . BREAST LUMPECTOMY WITH RADIOACTIVE SEED AND AXILLARY LYMPH NODE DISSECTION Left 01/22/2018   Procedure: LEFT BREAST LUMPECTOMY WITH RADIOACTIVE SEED AND COMPLETE LEFT AXILLARY LYMPH NODE DISSECTION;  Surgeon: [name], MD;  Location: [place];  Service: General;  Laterality: Left;  . CESAREAN SECTION    . COLON SURGERY    . COLOSTOMY    . COLOSTOMY REVERSAL    . FOOT SURGERY Right    done spur  . OOPHORECTOMY    . PORT-A-CATH REMOVAL Right 08/28/2018   Procedure: REMOVAL  PORT-A-CATH;  Surgeon: [name], MD;  Location: [place];  Service: General;  Laterality: Right;  . PORTACATH PLACEMENT N/A 09/06/2017   Procedure: INSERTION PORT-A-CATH;  Surgeon: [name], MD;  Location: [place];  Service: General;  Laterality: N/A;  . SMALL INTESTINE SURGERY    . SPINE SURGERY     Injections  . THYROIDECTOMY N/A 09/20/2019   Procedure: TOTAL THYROIDECTOMY;  Surgeon: [name], MD;  [facility];  Service: General;  Laterality: N/A;    There were no vitals filed for this visit.   Subjective Assessment - 04/21/20 0853    Subjective I am doing great. The doppler is negative    Pertinent History status post left breast upper outer quadrant and left axillary lymph node biopsy 08/07/2017, both positive for a T2 N1, stage IIB invasive ductal carcinoma, grade 3, estrogen receptor weakly positive, progesterone receptor negative, but HER-2 amplified, with an MIB-1 of 80%, staging chest CT and bone scan 08/30/2017 showed no evidence of metastatic disease, completed neoadjuvant chemo, Left lumpectomy on 01/22/2018 shows a ypT1b pN1a, grade 3 residual invasive ductal carcinoma, agnostic panel HER-2 negative, ER 40% weakly positive, PR negative, total of 11 axillary lymph nodes removed (1+), adjuvant radiation completed 04/20/18, started anastrozole 05/30/2018, status post total thyroidectomy 09/20/2019 for papillary thyroid carcinoma  Currently in Pain? Yes    Pain Score 5     Pain Location Axilla    Pain Orientation Left    Pain Descriptors / Indicators Aching;Sore    Pain Type Chronic pain    Pain Onset More than a month ago    Pain Frequency Constant    Aggravating Factors  laying on that side                             [facility] Adult PT Treatment/Exercise - 04/21/20 0001      Exercises   Exercises Shoulder      Shoulder Exercises: Pulleys   Flexion 2 minutes    ABduction 2 minutes      Shoulder Exercises: Stretch    Other Shoulder Stretches doorway stretch 2x20", single arm chest stretch on wall x 20"      Manual Therapy   Manual Therapy Edema management;Manual Lymphatic Drainage (MLD);Passive ROM    Manual therapy comments Addison from [employer] arrives at 945 for a pump trial    Edema Management prescription given for compression bra and sleeve which pt will get on [address].  Discussed sleeve options    Passive ROM to the left shoulder into flexion, abduction, and ER                       PT Long Term Goals - 04/13/20 1733      PT LONG TERM GOAL #1   Title Pt will report no tightness across L chest at end range abduction to allow improved mobility.    Time 4    Period Weeks    Status New    Target Date 05/11/20      PT LONG TERM GOAL #2   Title Pt will be independent in self MLD for long term management of lymphedema.    Time 4    Period Weeks    Status New    Target Date 05/11/20      PT LONG TERM GOAL #3   Title Pt will obtain appropriate compression garments for long term management of lymphedema.    Time 4    Period Weeks    Status New    Target Date 05/11/20      PT LONG TERM GOAL #4   Title Pt will be independent in a home exercise program for continued strengthening and stretching.    Time 4    Period Weeks    Status New    Target Date 05/11/20                 Plan - 04/21/20 0947    Clinical Impression Statement Focused today on stretching with tightness in the pectoralis evident so added some exercises to HEP to address this.  No MLD done today as [employer] was to arrive for a trial.  Pt was given prescriptions for bra and sleeve which she will get [address] due to schedule.    PT Frequency 2x / week    PT Duration 4 weeks    PT Treatment/Interventions ADLs/Self Care Home Management;Therapeutic exercise;Manual techniques;Manual lymph drainage;Passive range of motion;Taping;Compression bandaging;Patient/family education;Scar  mobilization;Vasopneumatic Device    PT Next Visit Plan begin MLD to L UE and breast, L shoulder PROM and L pec stretching    PT Home Exercise Plan Access Code: [email or web address]    Recommended Other Services flex[employer]ouch trial performed    Consulted  and Agree with Plan of Care Patient           Patient will benefit from skilled therapeutic intervention in order to improve the following deficits and impairments:     Visit Diagnosis: Lymphedema, not elsewhere classified  Stiffness of left shoulder, not elsewhere classified  Aftercare following surgery for neoplasm  Disorder of the skin and subcutaneous tissue related to radiation, unspecified  Malignant neoplasm of left female breast, unspecified estrogen receptor status, unspecified site of breast [facility])     Problem List Patient Active Problem List   Diagnosis Date Noted  . Papillary thyroid carcinoma ([place]) 04/07/2020  . Post-operative hypothyroidism 10/02/2019  . Neoplasm of uncertain behavior of thyroid gland 09/16/2019  . Multiple thyroid nodules 09/16/2019  . COVID-19 virus infection 02/20/2019  . Environmental allergies 01/12/2018  . Port-A-Cath in place 11/09/2017  . Malignant neoplasm of upper-outer quadrant of left breast in female, estrogen receptor positive ([place]) 08/10/2017    Stark Bray 04/21/2020, 9:50 AM  [place], [place], [address] Phone: [phone]   Fax:  [phone]  Name: Madison Hopkins MRN: [id number] Date of Birth: Jul 08, 1955

## 2020-04-23 ENCOUNTER — Other Ambulatory Visit: Payer: Self-pay

## 2020-04-23 ENCOUNTER — Ambulatory Visit: Payer: 59 | Admitting: Rehabilitation

## 2020-04-23 DIAGNOSIS — L599 Disorder of the skin and subcutaneous tissue related to radiation, unspecified: Secondary | ICD-10-CM

## 2020-04-23 DIAGNOSIS — I890 Lymphedema, not elsewhere classified: Secondary | ICD-10-CM

## 2020-04-23 DIAGNOSIS — Z483 Aftercare following surgery for neoplasm: Secondary | ICD-10-CM

## 2020-04-23 DIAGNOSIS — M25612 Stiffness of left shoulder, not elsewhere classified: Secondary | ICD-10-CM

## 2020-04-23 DIAGNOSIS — C50912 Malignant neoplasm of unspecified site of left female breast: Secondary | ICD-10-CM

## 2020-04-23 NOTE — Patient Instructions (Signed)

## 2020-04-23 NOTE — Therapy (Signed)
[place], [place], [address] Phone: [phone]   Fax:  [phone]  Physical Therapy Treatment  Patient Details  Name: Madison Hopkins MRN: [id number] Date of Birth: 1955/11/19 Referring Provider (PT): Dr. Barry Dienes   Encounter Date: 04/23/2020   PT End of Session - 04/23/20 1101    Visit Number 3    Number of Visits 9    Date for PT Re-Evaluation 05/11/20    PT Start Time 0801    PT Stop Time 0858    PT Time Calculation (min) 57 min    Activity Tolerance Patient tolerated treatment well    Behavior During Therapy [facility] for tasks assessed/performed           Past Medical History:  Diagnosis Date  . Abnormal Pap smear of vagina 07/28/2016   LGSIL; colpo 07/2016 atrophic squamous cells; colpo 07/2017 - atypia  . Allergy   . Breast cancer ([place]) 2018   Metastatic Left breast  . Elevated hemoglobin A1c 07/28/2016   level - 6.1  . Endometriosis   . GERD (gastroesophageal reflux disease)   . HSV-2 infection   . Iron deficiency anemia   . Low vitamin D level 07/28/2016   level 24.7  . Personal history of chemotherapy    finished 10/19  . Personal history of radiation therapy    finished 03/2018  . Thyroid cancer ([place])   . Thyroid neoplasm     Past Surgical History:  Procedure Laterality Date  . ABDOMINAL HYSTERECTOMY    . BREAST BIOPSY Left 08/09/2017  . BREAST LUMPECTOMY Left 01/22/2018  . BREAST LUMPECTOMY WITH RADIOACTIVE SEED AND AXILLARY LYMPH NODE DISSECTION Left 01/22/2018   Procedure: LEFT BREAST LUMPECTOMY WITH RADIOACTIVE SEED AND COMPLETE LEFT AXILLARY LYMPH NODE DISSECTION;  Surgeon: [name], MD;  Location: [place];  Service: General;  Laterality: Left;  . CESAREAN SECTION    . COLON SURGERY    . COLOSTOMY    . COLOSTOMY REVERSAL    . FOOT SURGERY Right    done spur  . OOPHORECTOMY    . PORT-A-CATH REMOVAL Right 08/28/2018   Procedure: REMOVAL  PORT-A-CATH;  Surgeon: [name], MD;  Location: [place];  Service: General;  Laterality: Right;  . PORTACATH PLACEMENT N/A 09/06/2017   Procedure: INSERTION PORT-A-CATH;  Surgeon: [name], MD;  Location: [place];  Service: General;  Laterality: N/A;  . SMALL INTESTINE SURGERY    . SPINE SURGERY     Injections  . THYROIDECTOMY N/A 09/20/2019   Procedure: TOTAL THYROIDECTOMY;  Surgeon: [name], MD;  [facility];  Service: General;  Laterality: N/A;    There were no vitals filed for this visit.   Subjective Assessment - 04/23/20 0804    Subjective It s a good morning.  I am a bit worried about medical expenses so I may wait on the pump and start with the compression and self MLD.    Pertinent History status post left breast upper outer quadrant and left axillary lymph node biopsy 08/07/2017, both positive for a T2 N1, stage IIB invasive ductal carcinoma, grade 3, estrogen receptor weakly positive, progesterone receptor negative, but HER-2 amplified, with an MIB-1 of 80%, staging chest CT and bone scan 08/30/2017 showed no evidence of metastatic disease, completed neoadjuvant chemo, Left lumpectomy on 01/22/2018 shows a ypT1b pN1a, grade 3 residual invasive ductal carcinoma, agnostic panel HER-2 negative, ER 40% weakly positive, PR negative, total of 11 axillary  lymph nodes removed (1+), adjuvant radiation completed 04/20/18, started anastrozole 05/30/2018, status post total thyroidectomy 09/20/2019 for papillary thyroid carcinoma    Currently in Pain? No/denies                             [facility] Adult PT Treatment/Exercise - 04/23/20 0001      Manual Therapy   Manual Therapy Manual Lymphatic Drainage (MLD)    Edema Management Discussion with pt regarding use of pump vs self MLD and how it is okay to wait on the pump and see if therapy works well along with self MLD and how she can get a pump at any time.  Also discussed how paying towards the  deductible with getting the sleeve so it goes towards her insurance vs OOP payment.  Pt is scheduled with melissa on 05/01/20 and will decide if that is how she wants to get her sleeve.  Education on lymphatic system anatomy and physiology and reasons for MLD.    Manual Lymphatic Drainage (MLD) Pt given combo handout of trunk clearance - breast MLD- and then upper arm MLD and was instructed in all steps with vcs, tcs, and hand over hand placement.  Performed in supine today but instructed in possible positions as well.  5 breaths, SCF, bil axillary nodes, axillary pathway, inguinal nodes, axillo inguinal pathway, then left breast work toward pathways, then left upper arm steps to elbow and then reversing steps.                  PT Education - 04/23/20 1101    Education Details self MLD    Person(s) Educated Patient    Methods Explanation;Demonstration;Tactile cues;Verbal cues;Handout    Comprehension Verbalized understanding;Returned demonstration;Verbal cues required;Tactile cues required;Need further instruction               PT Long Term Goals - 04/13/20 1733      PT LONG TERM GOAL #1   Title Pt will report no tightness across L chest at end range abduction to allow improved mobility.    Time 4    Period Weeks    Status New    Target Date 05/11/20      PT LONG TERM GOAL #2   Title Pt will be independent in self MLD for long term management of lymphedema.    Time 4    Period Weeks    Status New    Target Date 05/11/20      PT LONG TERM GOAL #3   Title Pt will obtain appropriate compression garments for long term management of lymphedema.    Time 4    Period Weeks    Status New    Target Date 05/11/20      PT LONG TERM GOAL #4   Title Pt will be independent in a home exercise program for continued strengthening and stretching.    Time 4    Period Weeks    Status New    Target Date 05/11/20                 Plan - 04/23/20 1101    Clinical Impression  Statement Focsed on self MLD education today with pt given handout and pt and PT performing all steps.  Pt needing work on pressure and sequence but will practice over the weekend.  Pt may wait on pump due to finances and will most likely get sleeve with Melissa here next  Friday.  Pt thankful for session today and reporting feeling improvements and grateful that she is learning how to manage her issue.    PT Frequency 2x / week    PT Duration 4 weeks    PT Treatment/Interventions ADLs/Self Care Home Management;Therapeutic exercise;Manual techniques;Manual lymph drainage;Passive range of motion;Taping;Compression bandaging;Patient/family education;Scar mobilization;Vasopneumatic Device    PT Next Visit Plan MLD to L UE and breast , L shoulder PROM and L pec stretching, show pt swell spot as she was asking at the end of last session (gave foam chip pack)    Consulted and Agree with Plan of Care Patient           Patient will benefit from skilled therapeutic intervention in order to improve the following deficits and impairments:     Visit Diagnosis: Lymphedema, not elsewhere classified  Stiffness of left shoulder, not elsewhere classified  Aftercare following surgery for neoplasm  Disorder of the skin and subcutaneous tissue related to radiation, unspecified  Malignant neoplasm of left female breast, unspecified estrogen receptor status, unspecified site of breast [facility])     Problem List Patient Active Problem List   Diagnosis Date Noted  . Papillary thyroid carcinoma ([place]) 04/07/2020  . Post-operative hypothyroidism 10/02/2019  . Neoplasm of uncertain behavior of thyroid gland 09/16/2019  . Multiple thyroid nodules 09/16/2019  . COVID-19 virus infection 02/20/2019  . Environmental allergies 01/12/2018  . Port-A-Cath in place 11/09/2017  . Malignant neoplasm of upper-outer quadrant of left breast in female, estrogen receptor positive ([place]) 08/10/2017    Stark Bray 04/23/2020,  11:03 AM  [place], [place], [address] Phone: [phone]   Fax:  [phone]  Name: Madison Hopkins MRN: [id number] Date of Birth: Jul 24, 1955

## 2020-04-27 ENCOUNTER — Encounter: Payer: Self-pay | Admitting: Physical Therapy

## 2020-04-27 ENCOUNTER — Ambulatory Visit: Payer: 59 | Admitting: Physical Therapy

## 2020-04-27 ENCOUNTER — Other Ambulatory Visit: Payer: Self-pay

## 2020-04-27 DIAGNOSIS — I890 Lymphedema, not elsewhere classified: Secondary | ICD-10-CM

## 2020-04-27 DIAGNOSIS — M25612 Stiffness of left shoulder, not elsewhere classified: Secondary | ICD-10-CM

## 2020-04-27 NOTE — Therapy (Signed)
[place], [place], [address] Phone: [phone]   Fax:  [phone]  Physical Therapy Treatment  Patient Details  Name: Madison Hopkins MRN: [id number] Date of Birth: February 06, 1956 Referring Provider (PT): Dr. Barry Dienes   Encounter Date: 04/27/2020   PT End of Session - 04/27/20 1000    Visit Number 4    Number of Visits 9    Date for PT Re-Evaluation 05/11/20    PT Start Time 0903    PT Stop Time 0956    PT Time Calculation (min) 53 min    Activity Tolerance Patient tolerated treatment well    Behavior During Therapy [facility] for tasks assessed/performed           Past Medical History:  Diagnosis Date  . Abnormal Pap smear of vagina 07/28/2016   LGSIL; colpo 07/2016 atrophic squamous cells; colpo 07/2017 - atypia  . Allergy   . Breast cancer ([place]) 2018   Metastatic Left breast  . Elevated hemoglobin A1c 07/28/2016   level - 6.1  . Endometriosis   . GERD (gastroesophageal reflux disease)   . HSV-2 infection   . Iron deficiency anemia   . Low vitamin D level 07/28/2016   level 24.7  . Personal history of chemotherapy    finished 10/19  . Personal history of radiation therapy    finished 03/2018  . Thyroid cancer ([place])   . Thyroid neoplasm     Past Surgical History:  Procedure Laterality Date  . ABDOMINAL HYSTERECTOMY    . BREAST BIOPSY Left 08/09/2017  . BREAST LUMPECTOMY Left 01/22/2018  . BREAST LUMPECTOMY WITH RADIOACTIVE SEED AND AXILLARY LYMPH NODE DISSECTION Left 01/22/2018   Procedure: LEFT BREAST LUMPECTOMY WITH RADIOACTIVE SEED AND COMPLETE LEFT AXILLARY LYMPH NODE DISSECTION;  Surgeon: [name], MD;  Location: [place];  Service: General;  Laterality: Left;  . CESAREAN SECTION    . COLON SURGERY    . COLOSTOMY    . COLOSTOMY REVERSAL    . FOOT SURGERY Right    done spur  . OOPHORECTOMY    . PORT-A-CATH REMOVAL Right 08/28/2018   Procedure: REMOVAL  PORT-A-CATH;  Surgeon: [name], MD;  Location: [place];  Service: General;  Laterality: Right;  . PORTACATH PLACEMENT N/A 09/06/2017   Procedure: INSERTION PORT-A-CATH;  Surgeon: [name], MD;  Location: [place];  Service: General;  Laterality: N/A;  . SMALL INTESTINE SURGERY    . SPINE SURGERY     Injections  . THYROIDECTOMY N/A 09/20/2019   Procedure: TOTAL THYROIDECTOMY;  Surgeon: [name], MD;  [facility];  Service: General;  Laterality: N/A;    There were no vitals filed for this visit.   Subjective Assessment - 04/27/20 0904    Subjective I have decided that I want to try the therapy and the sleeve for now. I don't want to do the pump yet. I have so many medical bills.    Pertinent History status post left breast upper outer quadrant and left axillary lymph node biopsy 08/07/2017, both positive for a T2 N1, stage IIB invasive ductal carcinoma, grade 3, estrogen receptor weakly positive, progesterone receptor negative, but HER-2 amplified, with an MIB-1 of 80%, staging chest CT and bone scan 08/30/2017 showed no evidence of metastatic disease, completed neoadjuvant chemo, Left lumpectomy on 01/22/2018 shows a ypT1b pN1a, grade 3 residual invasive ductal carcinoma, agnostic panel HER-2 negative, ER 40% weakly positive, PR negative, total of 11 axillary  lymph nodes removed (1+), adjuvant radiation completed 04/20/18, started anastrozole 05/30/2018, status post total thyroidectomy 09/20/2019 for papillary thyroid carcinoma    Patient Stated Goals get my swelling down, have my scar looked at    Currently in Pain? No/denies    Pain Score 0-No pain                             [facility] Adult PT Treatment/Exercise - 04/27/20 0001      Manual Therapy   Manual Lymphatic Drainage (MLD) In supine: short neck, 5 diaphragmatic breaths, right axillary nodes and establishment of interaxillary pathway, left inguinal nodes and establishment of axillo  inguinal pathway then L breast moving fluid towards pathways with focus on inferior and medial breast, then to L UE from elbow to shoulder moving fluid towards pathways and retracing all steps- gave verbal instruction throughout on correct pressure and skin stretch    Passive ROM to L shoulder in to flexion and abduction                  PT Education - 04/27/20 1003    Education Details lymphedema risk reduction practices    Person(s) Educated Patient    Methods Explanation;Handout    Comprehension Verbalized understanding               PT Long Term Goals - 04/13/20 1733      PT LONG TERM GOAL #1   Title Pt will report no tightness across L chest at end range abduction to allow improved mobility.    Time 4    Period Weeks    Status New    Target Date 05/11/20      PT LONG TERM GOAL #2   Title Pt will be independent in self MLD for long term management of lymphedema.    Time 4    Period Weeks    Status New    Target Date 05/11/20      PT LONG TERM GOAL #3   Title Pt will obtain appropriate compression garments for long term management of lymphedema.    Time 4    Period Weeks    Status New    Target Date 05/11/20      PT LONG TERM GOAL #4   Title Pt will be independent in a home exercise program for continued strengthening and stretching.    Time 4    Period Weeks    Status New    Target Date 05/11/20                 Plan - 04/27/20 1001    Clinical Impression Statement Continued with MLD to L breast and L UE to elbow while instructing pt in correct technique and speed. Pt demonstrated self MLD and was using too much pressure and going to fast. Explained the anatomy of the lymphatic system and why the correct technique is important. Also instructed pt in lymphedema risk reduction practices. She has decided against the compression pump for now and wants to try to manage with self MLD and compression garments.    PT Frequency 2x / week    PT Duration 4  weeks    PT Treatment/Interventions ADLs/Self Care Home Management;Therapeutic exercise;Manual techniques;Manual lymph drainage;Passive range of motion;Taping;Compression bandaging;Patient/family education;Scar mobilization;Vasopneumatic Device    PT Next Visit Plan MLD to L UE and breast , L shoulder PROM and L pec stretching, added swell spot to list for  Melissa    PT Home Exercise Plan Access Code: [email or web address]    Consulted and Agree with Plan of Care Patient           Patient will benefit from skilled therapeutic intervention in order to improve the following deficits and impairments:  Pain,Postural dysfunction,Increased fascial restricitons,Decreased knowledge of precautions,Decreased range of motion,Decreased scar mobility,Increased edema  Visit Diagnosis: Lymphedema, not elsewhere classified  Stiffness of left shoulder, not elsewhere classified     Problem List Patient Active Problem List   Diagnosis Date Noted  . Papillary thyroid carcinoma ([place]) 04/07/2020  . Post-operative hypothyroidism 10/02/2019  . Neoplasm of uncertain behavior of thyroid gland 09/16/2019  . Multiple thyroid nodules 09/16/2019  . COVID-19 virus infection 02/20/2019  . Environmental allergies 01/12/2018  . Port-A-Cath in place 11/09/2017  . Malignant neoplasm of upper-outer quadrant of left breast in female, estrogen receptor positive ([place]) 08/10/2017    Allyson Sabal [facility] 04/27/2020, 10:04 AM  [place] [address], [place], [address] Phone: [phone]   Fax:  [phone]  Name: Madison Hopkins MRN: [id number] Date of Birth: 07-17-1955  Manus Gunning, PT 04/27/20 10:04 AM

## 2020-04-29 ENCOUNTER — Encounter: Payer: 59 | Admitting: Physical Therapy

## 2020-05-01 ENCOUNTER — Ambulatory Visit: Payer: 59 | Attending: General Surgery | Admitting: Rehabilitation

## 2020-05-01 ENCOUNTER — Other Ambulatory Visit: Payer: Self-pay

## 2020-05-01 ENCOUNTER — Encounter: Payer: Self-pay | Admitting: Rehabilitation

## 2020-05-01 DIAGNOSIS — M25612 Stiffness of left shoulder, not elsewhere classified: Secondary | ICD-10-CM | POA: Diagnosis present

## 2020-05-01 DIAGNOSIS — Z483 Aftercare following surgery for neoplasm: Secondary | ICD-10-CM

## 2020-05-01 DIAGNOSIS — C50912 Malignant neoplasm of unspecified site of left female breast: Secondary | ICD-10-CM

## 2020-05-01 DIAGNOSIS — I890 Lymphedema, not elsewhere classified: Secondary | ICD-10-CM

## 2020-05-01 DIAGNOSIS — L599 Disorder of the skin and subcutaneous tissue related to radiation, unspecified: Secondary | ICD-10-CM | POA: Diagnosis present

## 2020-05-01 NOTE — Therapy (Signed)
[place], [place], [address] Phone: [phone]   Fax:  [phone]  Physical Therapy Treatment  Patient Details  Name: Madison Hopkins MRN: [id number] Date of Birth: [birth year] Referring Provider (PT): Dr. Barry Dienes   Encounter Date: 05/01/2020   PT End of Session - 05/01/20 0954    Visit Number 5    Number of Visits 9    Date for PT Re-Evaluation 05/11/20    PT Start Time 0900    PT Stop Time 0945    PT Time Calculation (min) 45 min    Activity Tolerance Patient tolerated treatment well    Behavior During Therapy [facility] for tasks assessed/performed           Past Medical History:  Diagnosis Date  . Abnormal Pap smear of vagina 07/28/2016   LGSIL; colpo 07/2016 atrophic squamous cells; colpo 07/2017 - atypia  . Allergy   . Breast cancer ([place]) 2018   Metastatic Left breast  . Elevated hemoglobin A1c 07/28/2016   level - 6.1  . Endometriosis   . GERD (gastroesophageal reflux disease)   . HSV-2 infection   . Iron deficiency anemia   . Low vitamin D level 07/28/2016   level 24.7  . Personal history of chemotherapy    finished 10/19  . Personal history of radiation therapy    finished 03/2018  . Thyroid cancer ([place])   . Thyroid neoplasm     Past Surgical History:  Procedure Laterality Date  . ABDOMINAL HYSTERECTOMY    . BREAST BIOPSY Left 08/09/2017  . BREAST LUMPECTOMY Left 01/22/2018  . BREAST LUMPECTOMY WITH RADIOACTIVE SEED AND AXILLARY LYMPH NODE DISSECTION Left 01/22/2018   Procedure: LEFT BREAST LUMPECTOMY WITH RADIOACTIVE SEED AND COMPLETE LEFT AXILLARY LYMPH NODE DISSECTION;  Surgeon: [name], MD;  Location: [place];  Service: General;  Laterality: Left;  . CESAREAN SECTION    . COLON SURGERY    . COLOSTOMY    . COLOSTOMY REVERSAL    . FOOT SURGERY Right    done spur  . OOPHORECTOMY    . PORT-A-CATH REMOVAL Right 08/28/2018   Procedure: REMOVAL  PORT-A-CATH;  Surgeon: [name], MD;  Location: [place];  Service: General;  Laterality: Right;  . PORTACATH PLACEMENT N/A 09/06/2017   Procedure: INSERTION PORT-A-CATH;  Surgeon: [name], MD;  Location: [place];  Service: General;  Laterality: N/A;  . SMALL INTESTINE SURGERY    . SPINE SURGERY     Injections  . THYROIDECTOMY N/A 09/20/2019   Procedure: TOTAL THYROIDECTOMY;  Surgeon: [name], MD;  [facility];  Service: General;  Laterality: N/A;    There were no vitals filed for this visit.   Subjective Assessment - 05/01/20 0901    Subjective I love the compression bra    Pertinent History status post left breast upper outer quadrant and left axillary lymph node biopsy 08/07/2017, both positive for a T2 N1, stage IIB invasive ductal carcinoma, grade 3, estrogen receptor weakly positive, progesterone receptor negative, but HER-2 amplified, with an MIB-1 of 80%, staging chest CT and bone scan 08/30/2017 showed no evidence of metastatic disease, completed neoadjuvant chemo, Left lumpectomy on 01/22/2018 shows a ypT1b pN1a, grade 3 residual invasive ductal carcinoma, agnostic panel HER-2 negative, ER 40% weakly positive, PR negative, total of 11 axillary lymph nodes removed (1+), adjuvant radiation completed 04/20/18, started anastrozole 05/30/2018, status post total thyroidectomy 09/20/2019 for papillary thyroid carcinoma    Currently  in Pain? No/denies                             [facility] Adult PT Treatment/Exercise - 05/01/20 0001      Manual Therapy   Edema Management pt being measured by Lenna Sciara at 945 discussed option.  Pt getting juzo dynamic sleeve and glove    Manual Lymphatic Drainage (MLD) In supine: short neck, 5 diaphragmatic breaths, right axillary nodes and establishment of interaxillary pathway, left inguinal nodes and establishment of axillo inguinal pathway then L breast moving fluid towards pathways with focus on inferior and  medial breast, then to L UE from elbow to shoulder moving fluid towards pathways and retracing all steps    Passive ROM to L shoulder in to flexion and abduction and D2 with axillary pinning                       PT Long Term Goals - 04/13/20 1733      PT LONG TERM GOAL #1   Title Pt will report no tightness across L chest at end range abduction to allow improved mobility.    Time 4    Period Weeks    Status New    Target Date 05/11/20      PT LONG TERM GOAL #2   Title Pt will be independent in self MLD for long term management of lymphedema.    Time 4    Period Weeks    Status New    Target Date 05/11/20      PT LONG TERM GOAL #3   Title Pt will obtain appropriate compression garments for long term management of lymphedema.    Time 4    Period Weeks    Status New    Target Date 05/11/20      PT LONG TERM GOAL #4   Title Pt will be independent in a home exercise program for continued strengthening and stretching.    Time 4    Period Weeks    Status New    Target Date 05/11/20                 Plan - 05/01/20 0954    Clinical Impression Statement Pt very happy with compression bra and already feeling better.  The breast continues with some medial fibrosis and was given a chip pack to use here in the bra.  Pt will not be getting a pump but is feeling much better with compression and MLD.    PT Frequency 2x / week    PT Duration 4 weeks    PT Treatment/Interventions ADLs/Self Care Home Management;Therapeutic exercise;Manual techniques;Manual lymph drainage;Passive range of motion;Taping;Compression bandaging;Patient/family education;Scar mobilization;Vasopneumatic Device    PT Next Visit Plan MLD to L UE and breast , L shoulder PROM and L pec stretching    Consulted and Agree with Plan of Care Patient           Patient will benefit from skilled therapeutic intervention in order to improve the following deficits and impairments:     Visit  Diagnosis: Lymphedema, not elsewhere classified  Stiffness of left shoulder, not elsewhere classified  Aftercare following surgery for neoplasm  Malignant neoplasm of left female breast, unspecified estrogen receptor status, unspecified site of breast (HCC)  Disorder of the skin and subcutaneous tissue related to radiation, unspecified     Problem List Patient Active Problem List   Diagnosis Date Noted  .  Papillary thyroid carcinoma ([place]) 04/07/2020  . Post-operative hypothyroidism 10/02/2019  . Neoplasm of uncertain behavior of thyroid gland 09/16/2019  . Multiple thyroid nodules 09/16/2019  . COVID-19 virus infection 02/20/2019  . Environmental allergies 01/12/2018  . Port-A-Cath in place 11/09/2017  . Malignant neoplasm of upper-outer quadrant of left breast in female, estrogen receptor positive ([place]) 08/10/2017    Stark Bray 05/01/2020, 9:57 AM  [place], [place], [address] Phone: [phone]   Fax:  [phone]  Name: Madison Hopkins MRN: [id number] Date of Birth: 08/26/1955

## 2020-05-04 ENCOUNTER — Ambulatory Visit: Payer: 59 | Admitting: Physical Therapy

## 2020-05-04 ENCOUNTER — Other Ambulatory Visit: Payer: Self-pay

## 2020-05-04 ENCOUNTER — Encounter: Payer: Self-pay | Admitting: Physical Therapy

## 2020-05-04 DIAGNOSIS — M25612 Stiffness of left shoulder, not elsewhere classified: Secondary | ICD-10-CM

## 2020-05-04 DIAGNOSIS — I890 Lymphedema, not elsewhere classified: Secondary | ICD-10-CM

## 2020-05-04 NOTE — Therapy (Signed)
[place], [place], [address] Phone: [phone]   Fax:  [phone]  Physical Therapy Treatment  Patient Details  Name: Madison Hopkins MRN: [id number] Date of Birth: 07/25/1955 Referring Provider (PT): Dr. Barry Dienes   Encounter Date: 05/04/2020   PT End of Session - 05/04/20 1059    Visit Number 6    Number of Visits 9    Date for PT Re-Evaluation 05/11/20    PT Start Time 1003    PT Stop Time 1055    PT Time Calculation (min) 52 min    Activity Tolerance Patient tolerated treatment well    Behavior During Therapy [facility] for tasks assessed/performed           Past Medical History:  Diagnosis Date  . Abnormal Pap smear of vagina 07/28/2016   LGSIL; colpo 07/2016 atrophic squamous cells; colpo 07/2017 - atypia  . Allergy   . Breast cancer ([place]) 2018   Metastatic Left breast  . Elevated hemoglobin A1c 07/28/2016   level - 6.1  . Endometriosis   . GERD (gastroesophageal reflux disease)   . HSV-2 infection   . Iron deficiency anemia   . Low vitamin D level 07/28/2016   level 24.7  . Personal history of chemotherapy    finished 10/19  . Personal history of radiation therapy    finished 03/2018  . Thyroid cancer ([place])   . Thyroid neoplasm     Past Surgical History:  Procedure Laterality Date  . ABDOMINAL HYSTERECTOMY    . BREAST BIOPSY Left 08/09/2017  . BREAST LUMPECTOMY Left 01/22/2018  . BREAST LUMPECTOMY WITH RADIOACTIVE SEED AND AXILLARY LYMPH NODE DISSECTION Left 01/22/2018   Procedure: LEFT BREAST LUMPECTOMY WITH RADIOACTIVE SEED AND COMPLETE LEFT AXILLARY LYMPH NODE DISSECTION;  Surgeon: [name], MD;  Location: [place];  Service: General;  Laterality: Left;  . CESAREAN SECTION    . COLON SURGERY    . COLOSTOMY    . COLOSTOMY REVERSAL    . FOOT SURGERY Right    done spur  . OOPHORECTOMY    . PORT-A-CATH REMOVAL Right 08/28/2018   Procedure: REMOVAL  PORT-A-CATH;  Surgeon: [name], MD;  Location: [place];  Service: General;  Laterality: Right;  . PORTACATH PLACEMENT N/A 09/06/2017   Procedure: INSERTION PORT-A-CATH;  Surgeon: [name], MD;  Location: [place];  Service: General;  Laterality: N/A;  . SMALL INTESTINE SURGERY    . SPINE SURGERY     Injections  . THYROIDECTOMY N/A 09/20/2019   Procedure: TOTAL THYROIDECTOMY;  Surgeon: [name], MD;  [facility];  Service: General;  Laterality: N/A;    There were no vitals filed for this visit.   Subjective Assessment - 05/04/20 1005    Subjective I got my compression bra and I love it. My breast is not as hard. I got fitted for my sleeve.    Pertinent History status post left breast upper outer quadrant and left axillary lymph node biopsy 08/07/2017, both positive for a T2 N1, stage IIB invasive ductal carcinoma, grade 3, estrogen receptor weakly positive, progesterone receptor negative, but HER-2 amplified, with an MIB-1 of 80%, staging chest CT and bone scan 08/30/2017 showed no evidence of metastatic disease, completed neoadjuvant chemo, Left lumpectomy on 01/22/2018 shows a ypT1b pN1a, grade 3 residual invasive ductal carcinoma, agnostic panel HER-2 negative, ER 40% weakly positive, PR negative, total of 11 axillary lymph nodes removed (1+), adjuvant radiation completed 04/20/18,  started anastrozole 05/30/2018, status post total thyroidectomy 09/20/2019 for papillary thyroid carcinoma    Patient Stated Goals get my swelling down, have my scar looked at    Currently in Pain? No/denies    Pain Score 0-No pain                             [facility] Adult PT Treatment/Exercise - 05/04/20 0001      Manual Therapy   Manual Lymphatic Drainage (MLD) In supine: short neck, 5 diaphragmatic breaths, right axillary nodes and establishment of interaxillary pathway, left inguinal nodes and establishment of axillo inguinal pathway then L breast moving  fluid towards pathways with focus on inferior and medial breast, then to L UE from elbow to shoulder moving fluid towards pathways and retracing all steps    Passive ROM to L shoulder in to flexion, abduction and ER with STM to L axilla in area of tightness                       PT Long Term Goals - 04/13/20 1733      PT LONG TERM GOAL #1   Title Pt will report no tightness across L chest at end range abduction to allow improved mobility.    Time 4    Period Weeks    Status New    Target Date 05/11/20      PT LONG TERM GOAL #2   Title Pt will be independent in self MLD for long term management of lymphedema.    Time 4    Period Weeks    Status New    Target Date 05/11/20      PT LONG TERM GOAL #3   Title Pt will obtain appropriate compression garments for long term management of lymphedema.    Time 4    Period Weeks    Status New    Target Date 05/11/20      PT LONG TERM GOAL #4   Title Pt will be independent in a home exercise program for continued strengthening and stretching.    Time 4    Period Weeks    Status New    Target Date 05/11/20                 Plan - 05/04/20 1059    Clinical Impression Statement Pt has been wearing her compression bra and has noticed a lot of improvement in her L breast. She reports it is less fibrotic. Fibrosis in medial breast has significantly decreased since pt began wearing her compression bra. Pt has been practicing self MLD at home and has been trying to use less pressure. She reports overall her breast is improving.    PT Frequency 2x / week    PT Duration 4 weeks    PT Treatment/Interventions ADLs/Self Care Home Management;Therapeutic exercise;Manual techniques;Manual lymph drainage;Passive range of motion;Taping;Compression bandaging;Patient/family education;Scar mobilization;Vasopneumatic Device    PT Next Visit Plan MLD to L UE and breast , L shoulder PROM and L pec stretching    PT Home Exercise Plan Access  Code: [email or web address]    Consulted and Agree with Plan of Care Patient           Patient will benefit from skilled therapeutic intervention in order to improve the following deficits and impairments:  Pain,Postural dysfunction,Increased fascial restricitons,Decreased knowledge of precautions,Decreased range of motion,Decreased scar mobility,Increased edema  Visit Diagnosis: Lymphedema, not elsewhere classified  Stiffness of left shoulder, not elsewhere classified     Problem List Patient Active Problem List   Diagnosis Date Noted  . Papillary thyroid carcinoma ([place]) 04/07/2020  . Post-operative hypothyroidism 10/02/2019  . Neoplasm of uncertain behavior of thyroid gland 09/16/2019  . Multiple thyroid nodules 09/16/2019  . COVID-19 virus infection 02/20/2019  . Environmental allergies 01/12/2018  . Port-A-Cath in place 11/09/2017  . Malignant neoplasm of upper-outer quadrant of left breast in female, estrogen receptor positive ([place]) 08/10/2017    Allyson Sabal [facility] 05/04/2020, 11:01 AM  [place], [place], [address] Phone: [phone]   Fax:  [phone]  Name: Madison Hopkins MRN: [id number] Date of Birth: 12/27/1955  Manus Gunning, PT 05/04/20 11:02 AM

## 2020-05-06 ENCOUNTER — Other Ambulatory Visit: Payer: Self-pay

## 2020-05-06 ENCOUNTER — Ambulatory Visit: Payer: 59 | Admitting: Physical Therapy

## 2020-05-06 DIAGNOSIS — I890 Lymphedema, not elsewhere classified: Secondary | ICD-10-CM | POA: Diagnosis not present

## 2020-05-06 NOTE — Therapy (Signed)
[place], [place], [address] Phone: [phone]   Fax:  [phone]  Physical Therapy Treatment  Patient Details  Name: Madison Hopkins MRN: [id number] Date of Birth: 10-23-1955 Referring Provider (PT): Dr. Barry Dienes   Encounter Date: 05/06/2020   PT End of Session - 05/06/20 1056    Visit Number 7    Number of Visits 9    Date for PT Re-Evaluation 05/11/20    PT Start Time 1006    PT Stop Time 1058    PT Time Calculation (min) 52 min    Activity Tolerance Patient tolerated treatment well    Behavior During Therapy [facility] for tasks assessed/performed           Past Medical History:  Diagnosis Date  . Abnormal Pap smear of vagina 07/28/2016   LGSIL; colpo 07/2016 atrophic squamous cells; colpo 07/2017 - atypia  . Allergy   . Breast cancer ([place]) 2018   Metastatic Left breast  . Elevated hemoglobin A1c 07/28/2016   level - 6.1  . Endometriosis   . GERD (gastroesophageal reflux disease)   . HSV-2 infection   . Iron deficiency anemia   . Low vitamin D level 07/28/2016   level 24.7  . Personal history of chemotherapy    finished 10/19  . Personal history of radiation therapy    finished 03/2018  . Thyroid cancer ([place])   . Thyroid neoplasm     Past Surgical History:  Procedure Laterality Date  . ABDOMINAL HYSTERECTOMY    . BREAST BIOPSY Left 08/09/2017  . BREAST LUMPECTOMY Left 01/22/2018  . BREAST LUMPECTOMY WITH RADIOACTIVE SEED AND AXILLARY LYMPH NODE DISSECTION Left 01/22/2018   Procedure: LEFT BREAST LUMPECTOMY WITH RADIOACTIVE SEED AND COMPLETE LEFT AXILLARY LYMPH NODE DISSECTION;  Surgeon: [name], MD;  Location: [place];  Service: General;  Laterality: Left;  . CESAREAN SECTION    . COLON SURGERY    . COLOSTOMY    . COLOSTOMY REVERSAL    . FOOT SURGERY Right    done spur  . OOPHORECTOMY    . PORT-A-CATH REMOVAL Right 08/28/2018   Procedure: REMOVAL  PORT-A-CATH;  Surgeon: [name], MD;  Location: [place];  Service: General;  Laterality: Right;  . PORTACATH PLACEMENT N/A 09/06/2017   Procedure: INSERTION PORT-A-CATH;  Surgeon: [name], MD;  Location: [place];  Service: General;  Laterality: N/A;  . SMALL INTESTINE SURGERY    . SPINE SURGERY     Injections  . THYROIDECTOMY N/A 09/20/2019   Procedure: TOTAL THYROIDECTOMY;  Surgeon: [name], MD;  [facility];  Service: General;  Laterality: N/A;    There were no vitals filed for this visit.   Subjective Assessment - 05/06/20 1100    Subjective I am still wearing my compression bra and its great.    Pertinent History status post left breast upper outer quadrant and left axillary lymph node biopsy 08/07/2017, both positive for a T2 N1, stage IIB invasive ductal carcinoma, grade 3, estrogen receptor weakly positive, progesterone receptor negative, but HER-2 amplified, with an MIB-1 of 80%, staging chest CT and bone scan 08/30/2017 showed no evidence of metastatic disease, completed neoadjuvant chemo, Left lumpectomy on 01/22/2018 shows a ypT1b pN1a, grade 3 residual invasive ductal carcinoma, agnostic panel HER-2 negative, ER 40% weakly positive, PR negative, total of 11 axillary lymph nodes removed (1+), adjuvant radiation completed 04/20/18, started anastrozole 05/30/2018, status post total thyroidectomy 09/20/2019 for papillary thyroid  carcinoma    Patient Stated Goals get my swelling down, have my scar looked at    Currently in Pain? No/denies    Pain Score 0-No pain                             [facility] Adult PT Treatment/Exercise - 05/06/20 0001      Manual Therapy   Manual Lymphatic Drainage (MLD) In supine: short neck, 5 diaphragmatic breaths, right axillary nodes and establishment of interaxillary pathway, left inguinal nodes and establishment of axillo inguinal pathway then L breast moving fluid towards pathways with focus on inferior  and medial breast, then to L UE from elbow to shoulder moving fluid towards pathways and retracing all steps                       PT Long Term Goals - 04/13/20 1733      PT LONG TERM GOAL #1   Title Pt will report no tightness across L chest at end range abduction to allow improved mobility.    Time 4    Period Weeks    Status New    Target Date 05/11/20      PT LONG TERM GOAL #2   Title Pt will be independent in self MLD for long term management of lymphedema.    Time 4    Period Weeks    Status New    Target Date 05/11/20      PT LONG TERM GOAL #3   Title Pt will obtain appropriate compression garments for long term management of lymphedema.    Time 4    Period Weeks    Status New    Target Date 05/11/20      PT LONG TERM GOAL #4   Title Pt will be independent in a home exercise program for continued strengthening and stretching.    Time 4    Period Weeks    Status New    Target Date 05/11/20                 Plan - 05/06/20 1058    Clinical Impression Statement Pt still with visible swelling in L upper arm. She is awaiting arrival of her compression sleeve and glove. She continues to feel improvement in her L breast fibrosis since wearing her compression bra. Continued with MLD to L breast and UE today. WIll assess pt's progress towards goals at next session and update goals as appropriate.    PT Frequency 2x / week    PT Duration 4 weeks    PT Treatment/Interventions ADLs/Self Care Home Management;Therapeutic exercise;Manual techniques;Manual lymph drainage;Passive range of motion;Taping;Compression bandaging;Patient/family education;Scar mobilization;Vasopneumatic Device    PT Next Visit Plan update goals - MLD to L UE and breast , L shoulder PROM and L pec stretching    PT Home Exercise Plan Access Code: [email or web address]    Consulted and Agree with Plan of Care Patient           Patient will benefit from skilled therapeutic intervention in order to  improve the following deficits and impairments:  Pain,Postural dysfunction,Increased fascial restricitons,Decreased knowledge of precautions,Decreased range of motion,Decreased scar mobility,Increased edema  Visit Diagnosis: Lymphedema, not elsewhere classified     Problem List Patient Active Problem List   Diagnosis Date Noted  . Papillary thyroid carcinoma ([place]) 04/07/2020  . Post-operative hypothyroidism 10/02/2019  . Neoplasm of uncertain behavior of thyroid gland  09/16/2019  . Multiple thyroid nodules 09/16/2019  . COVID-19 virus infection 02/20/2019  . Environmental allergies 01/12/2018  . Port-A-Cath in place 11/09/2017  . Malignant neoplasm of upper-outer quadrant of left breast in female, estrogen receptor positive ([place]) 08/10/2017    Allyson Sabal [facility] 05/06/2020, 11:01 AM  [place], [place], [address] Phone: [phone]   Fax:  [phone]  Name: KINDEL ROCHEFORT MRN: [id number] Date of Birth: Aug 16, 1955  Manus Gunning, PT 05/06/20 11:01 AM  Manus Gunning, PT 05/06/20 11:01 AM

## 2020-05-11 ENCOUNTER — Ambulatory Visit: Payer: 59 | Admitting: Physical Therapy

## 2020-05-11 ENCOUNTER — Other Ambulatory Visit: Payer: Self-pay

## 2020-05-11 ENCOUNTER — Encounter: Payer: Self-pay | Admitting: Physical Therapy

## 2020-05-11 DIAGNOSIS — Z483 Aftercare following surgery for neoplasm: Secondary | ICD-10-CM

## 2020-05-11 DIAGNOSIS — I890 Lymphedema, not elsewhere classified: Secondary | ICD-10-CM

## 2020-05-11 DIAGNOSIS — C50912 Malignant neoplasm of unspecified site of left female breast: Secondary | ICD-10-CM

## 2020-05-11 DIAGNOSIS — M25612 Stiffness of left shoulder, not elsewhere classified: Secondary | ICD-10-CM

## 2020-05-11 DIAGNOSIS — L599 Disorder of the skin and subcutaneous tissue related to radiation, unspecified: Secondary | ICD-10-CM

## 2020-05-11 NOTE — Therapy (Signed)
[place], [place], [address] Phone: [phone]   Fax:  [phone]  Physical Therapy Treatment  Patient Details  Name: Madison Hopkins MRN: [id number] Date of Birth: 1955/08/26 Referring Provider (PT): Dr. Barry Dienes   Encounter Date: 05/11/2020   PT End of Session - 05/11/20 0915    Visit Number 8    Number of Visits 17    Date for PT Re-Evaluation 06/08/20    PT Start Time 0906    PT Stop Time 0953    PT Time Calculation (min) 47 min    Activity Tolerance Patient tolerated treatment well    Behavior During Therapy [facility] for tasks assessed/performed           Past Medical History:  Diagnosis Date  . Abnormal Pap smear of vagina 07/28/2016   LGSIL; colpo 07/2016 atrophic squamous cells; colpo 07/2017 - atypia  . Allergy   . Breast cancer ([place]) 2018   Metastatic Left breast  . Elevated hemoglobin A1c 07/28/2016   level - 6.1  . Endometriosis   . GERD (gastroesophageal reflux disease)   . HSV-2 infection   . Iron deficiency anemia   . Low vitamin D level 07/28/2016   level 24.7  . Personal history of chemotherapy    finished 10/19  . Personal history of radiation therapy    finished 03/2018  . Thyroid cancer ([place])   . Thyroid neoplasm     Past Surgical History:  Procedure Laterality Date  . ABDOMINAL HYSTERECTOMY    . BREAST BIOPSY Left 08/09/2017  . BREAST LUMPECTOMY Left 01/22/2018  . BREAST LUMPECTOMY WITH RADIOACTIVE SEED AND AXILLARY LYMPH NODE DISSECTION Left 01/22/2018   Procedure: LEFT BREAST LUMPECTOMY WITH RADIOACTIVE SEED AND COMPLETE LEFT AXILLARY LYMPH NODE DISSECTION;  Surgeon: [name], MD;  Location: [place];  Service: General;  Laterality: Left;  . CESAREAN SECTION    . COLON SURGERY    . COLOSTOMY    . COLOSTOMY REVERSAL    . FOOT SURGERY Right    done spur  . OOPHORECTOMY    . PORT-A-CATH REMOVAL Right 08/28/2018   Procedure: REMOVAL  PORT-A-CATH;  Surgeon: [name], MD;  Location: [place];  Service: General;  Laterality: Right;  . PORTACATH PLACEMENT N/A 09/06/2017   Procedure: INSERTION PORT-A-CATH;  Surgeon: [name], MD;  Location: [place];  Service: General;  Laterality: N/A;  . SMALL INTESTINE SURGERY    . SPINE SURGERY     Injections  . THYROIDECTOMY N/A 09/20/2019   Procedure: TOTAL THYROIDECTOMY;  Surgeon: [name], MD;  [facility];  Service: General;  Laterality: N/A;    There were no vitals filed for this visit.   Subjective Assessment - 05/11/20 0909    Subjective My bones were bothering me this weekend. I got my compression sleeve and glove.    Pertinent History status post left breast upper outer quadrant and left axillary lymph node biopsy 08/07/2017, both positive for a T2 N1, stage IIB invasive ductal carcinoma, grade 3, estrogen receptor weakly positive, progesterone receptor negative, but HER-2 amplified, with an MIB-1 of 80%, staging chest CT and bone scan 08/30/2017 showed no evidence of metastatic disease, completed neoadjuvant chemo, Left lumpectomy on 01/22/2018 shows a ypT1b pN1a, grade 3 residual invasive ductal carcinoma, agnostic panel HER-2 negative, ER 40% weakly positive, PR negative, total of 11 axillary lymph nodes removed (1+), adjuvant radiation completed 04/20/18, started anastrozole 05/30/2018, status post total thyroidectomy  09/20/2019 for papillary thyroid carcinoma    Patient Stated Goals get my swelling down, have my scar looked at    Currently in Pain? No/denies    Pain Score 0-No pain                             [facility] Adult PT Treatment/Exercise - 05/11/20 0001      Manual Therapy   Manual Therapy Soft tissue mobilization    Soft tissue mobilization to L pec and L serratus - numerous areas of tightness and trigger points palpable with pt reporting increased tenderness that did ease today with treatment, pt felt less tight by  end of session with less discomfort                  PT Education - 05/11/20 1001    Education Details doorway stretches throughout day to decrease pec tightness    Person(s) Educated Patient    Methods Explanation    Comprehension Verbalized understanding;Returned demonstration               PT Long Term Goals - 05/11/20 0910      PT LONG TERM GOAL #1   Title Pt will report no tightness across L chest at end range abduction to allow improved mobility.    Baseline 05/11/20- pt still reports tightness with this    Time 4    Period Weeks    Status On-going      PT LONG TERM GOAL #2   Title Pt will be independent in self MLD for long term management of lymphedema.    Baseline 05/11/20- pt is independent with this    Time 4    Period Weeks    Status Achieved      PT LONG TERM GOAL #3   Title Pt will obtain appropriate compression garments for long term management of lymphedema.    Baseline 05/11/20- pt received her sleeve and glove and has been wearing it    Time 4    Period Weeks    Status Achieved      PT LONG TERM GOAL #4   Title Pt will be independent in a home exercise program for continued strengthening and stretching.    Time 4    Period Weeks    Status Achieved                 Plan - 05/11/20 0957    Clinical Impression Statement Assessed pt's progress towards goals in therapy today. Pt has met all goals for therapy except she is still having tightness with end range abduction across her chest. Began soft tissue mobilization to L pec and serratus today. Numerous areas of tightness and trigger points noted. These did improve with soft tissue mobilization. Pt woudl benefit from additional skilled PT services to address pec tightness to decrease discomfort with L shoulder ROM.    PT Frequency 2x / week    PT Duration 4 weeks    PT Treatment/Interventions ADLs/Self Care Home Management;Therapeutic exercise;Manual techniques;Manual lymph drainage;Passive  range of motion;Taping;Compression bandaging;Patient/family education;Scar mobilization;Vasopneumatic Device    PT Next Visit Plan STM to L pec and serratus, remeasure L UE circumferences    PT Home Exercise Plan Access Code: [email or web address]    Consulted and Agree with Plan of Care Patient           Patient will benefit from skilled therapeutic intervention in order to improve the following  deficits and impairments:  Pain,Postural dysfunction,Increased fascial restricitons,Decreased knowledge of precautions,Decreased range of motion,Decreased scar mobility,Increased edema  Visit Diagnosis: Stiffness of left shoulder, not elsewhere classified  Aftercare following surgery for neoplasm  Lymphedema, not elsewhere classified  Disorder of the skin and subcutaneous tissue related to radiation, unspecified  Malignant neoplasm of left female breast, unspecified estrogen receptor status, unspecified site of breast [facility])     Problem List Patient Active Problem List   Diagnosis Date Noted  . Papillary thyroid carcinoma ([place]) 04/07/2020  . Post-operative hypothyroidism 10/02/2019  . Neoplasm of uncertain behavior of thyroid gland 09/16/2019  . Multiple thyroid nodules 09/16/2019  . COVID-19 virus infection 02/20/2019  . Environmental allergies 01/12/2018  . Port-A-Cath in place 11/09/2017  . Malignant neoplasm of upper-outer quadrant of left breast in female, estrogen receptor positive ([place]) 08/10/2017    Allyson Sabal [facility] 05/11/2020, 10:01 AM  [place], [place], [address] Phone: [phone]   Fax:  [phone]  Name: Madison Hopkins MRN: [id number] Date of Birth: October 17, 1955  Manus Gunning, PT 05/11/20 10:01 AM

## 2020-05-13 ENCOUNTER — Other Ambulatory Visit: Payer: Self-pay

## 2020-05-13 ENCOUNTER — Encounter: Payer: Self-pay | Admitting: Physical Therapy

## 2020-05-13 ENCOUNTER — Ambulatory Visit: Payer: 59 | Admitting: Physical Therapy

## 2020-05-13 DIAGNOSIS — Z483 Aftercare following surgery for neoplasm: Secondary | ICD-10-CM

## 2020-05-13 DIAGNOSIS — I890 Lymphedema, not elsewhere classified: Secondary | ICD-10-CM | POA: Diagnosis not present

## 2020-05-13 DIAGNOSIS — M25612 Stiffness of left shoulder, not elsewhere classified: Secondary | ICD-10-CM

## 2020-05-13 NOTE — Therapy (Signed)
[place], [place], [address] Phone: [phone]   Fax:  [phone]  Physical Therapy Treatment  Patient Details  Name: Madison Hopkins MRN: [id number] Date of Birth: 1956/01/02 Referring Provider (PT): Dr. Barry Dienes   Encounter Date: 05/13/2020   PT End of Session - 05/13/20 0853    Visit Number 9    Number of Visits 17    Date for PT Re-Evaluation 06/08/20    PT Start Time 0804    PT Stop Time 4008    PT Time Calculation (min) 53 min    Activity Tolerance Patient tolerated treatment well    Behavior During Therapy [name] for tasks assessed/performed           Past Medical History:  Diagnosis Date  . Abnormal Pap smear of vagina 07/28/2016   LGSIL; colpo 07/2016 atrophic squamous cells; colpo 07/2017 - atypia  . Allergy   . Breast cancer ([place]) 2018   Metastatic Left breast  . Elevated hemoglobin A1c 07/28/2016   level - 6.1  . Endometriosis   . GERD (gastroesophageal reflux disease)   . HSV-2 infection   . Iron deficiency anemia   . Low vitamin D level 07/28/2016   level 24.7  . Personal history of chemotherapy    finished 10/19  . Personal history of radiation therapy    finished 03/2018  . Thyroid cancer ([place])   . Thyroid neoplasm     Past Surgical History:  Procedure Laterality Date  . ABDOMINAL HYSTERECTOMY    . BREAST BIOPSY Left 08/09/2017  . BREAST LUMPECTOMY Left 01/22/2018  . BREAST LUMPECTOMY WITH RADIOACTIVE SEED AND AXILLARY LYMPH NODE DISSECTION Left 01/22/2018   Procedure: LEFT BREAST LUMPECTOMY WITH RADIOACTIVE SEED AND COMPLETE LEFT AXILLARY LYMPH NODE DISSECTION;  Surgeon: [name], MD;  Location: [place];  Service: General;  Laterality: Left;  . CESAREAN SECTION    . COLON SURGERY    . COLOSTOMY    . COLOSTOMY REVERSAL    . FOOT SURGERY Right    done spur  . OOPHORECTOMY    . PORT-A-CATH REMOVAL Right 08/28/2018   Procedure: REMOVAL  PORT-A-CATH;  Surgeon: [name], MD;  Location: [place];  Service: General;  Laterality: Right;  . PORTACATH PLACEMENT N/A 09/06/2017   Procedure: INSERTION PORT-A-CATH;  Surgeon: [name], MD;  Location: [place];  Service: General;  Laterality: N/A;  . SMALL INTESTINE SURGERY    . SPINE SURGERY     Injections  . THYROIDECTOMY N/A 09/20/2019   Procedure: TOTAL THYROIDECTOMY;  Surgeon: [name], MD;  [facility];  Service: General;  Laterality: N/A;    There were no vitals filed for this visit.   Subjective Assessment - 05/13/20 0806    Subjective I have been doing the stretches. It was a good feel after last session.    Pertinent History status post left breast upper outer quadrant and left axillary lymph node biopsy 08/07/2017, both positive for a T2 N1, stage IIB invasive ductal carcinoma, grade 3, estrogen receptor weakly positive, progesterone receptor negative, but HER-2 amplified, with an MIB-1 of 80%, staging chest CT and bone scan 08/30/2017 showed no evidence of metastatic disease, completed neoadjuvant chemo, Left lumpectomy on 01/22/2018 shows a ypT1b pN1a, grade 3 residual invasive ductal carcinoma, agnostic panel HER-2 negative, ER 40% weakly positive, PR negative, total of 11 axillary lymph nodes removed (1+), adjuvant radiation completed 04/20/18, started anastrozole 05/30/2018, status post total thyroidectomy  09/20/2019 for papillary thyroid carcinoma    Patient Stated Goals get my swelling down, have my scar looked at    Currently in Pain? No/denies    Pain Score 0-No pain                             [facility] Adult PT Treatment/Exercise - 05/13/20 0001      Manual Therapy   Soft tissue mobilization to L pec and L serratus - numerous areas of tightness and trigger points palpable with pt reporting increased tenderness that did ease today with treatment, pt felt less tight by end of session with less discomfort    Manual  Lymphatic Drainage (MLD) In supine: short neck, 5 diaphragmatic breaths, right axillary nodes and establishment of interaxillary pathway, left inguinal nodes and establishment of axillo inguinal pathway then to L UE from elbow to shoulder moving fluid towards pathways and retracing all steps                       PT Long Term Goals - 05/11/20 0910      PT LONG TERM GOAL #1   Title Pt will report no tightness across L chest at end range abduction to allow improved mobility.    Baseline 05/11/20- pt still reports tightness with this    Time 4    Period Weeks    Status On-going      PT LONG TERM GOAL #2   Title Pt will be independent in self MLD for long term management of lymphedema.    Baseline 05/11/20- pt is independent with this    Time 4    Period Weeks    Status Achieved      PT LONG TERM GOAL #3   Title Pt will obtain appropriate compression garments for long term management of lymphedema.    Baseline 05/11/20- pt received her sleeve and glove and has been wearing it    Time 4    Period Weeks    Status Achieved      PT LONG TERM GOAL #4   Title Pt will be independent in a home exercise program for continued strengthening and stretching.    Time 4    Period Weeks    Status Achieved                 Plan - 05/13/20 0853    Clinical Impression Statement Continued with soft tissue mobilization to L serratus and pec in area of tightness with improvement noted today. MLD to L upper arm due to fullness in this area. Pt has been wearing her sleeve and glove. Pt reports she felt much better at end of session.    PT Frequency 2x / week    PT Duration 4 weeks    PT Treatment/Interventions ADLs/Self Care Home Management;Therapeutic exercise;Manual techniques;Manual lymph drainage;Passive range of motion;Taping;Compression bandaging;Patient/family education;Scar mobilization;Vasopneumatic Device    PT Next Visit Plan STM to L pec and serratus, remeasure L UE  circumferences    Consulted and Agree with Plan of Care Patient           Patient will benefit from skilled therapeutic intervention in order to improve the following deficits and impairments:  Pain,Postural dysfunction,Increased fascial restricitons,Decreased knowledge of precautions,Decreased range of motion,Decreased scar mobility,Increased edema  Visit Diagnosis: Stiffness of left shoulder, not elsewhere classified  Lymphedema, not elsewhere classified  Aftercare following surgery for neoplasm  Problem List Patient Active Problem List   Diagnosis Date Noted  . Papillary thyroid carcinoma ([place]) 04/07/2020  . Post-operative hypothyroidism 10/02/2019  . Neoplasm of uncertain behavior of thyroid gland 09/16/2019  . Multiple thyroid nodules 09/16/2019  . COVID-19 virus infection 02/20/2019  . Environmental allergies 01/12/2018  . Port-A-Cath in place 11/09/2017  . Malignant neoplasm of upper-outer quadrant of left breast in female, estrogen receptor positive ([place]) 08/10/2017    Allyson Sabal [facility] 05/13/2020, 8:57 AM  [place], [place], [address] Phone: [phone]   Fax:  [phone]  Name: Madison Hopkins MRN: [id number] Date of Birth: 1955-12-11  Manus Gunning, PT 05/13/20 8:58 AM

## 2020-05-18 ENCOUNTER — Telehealth: Payer: Self-pay | Admitting: Adult Health

## 2020-05-18 ENCOUNTER — Ambulatory Visit: Payer: 59 | Admitting: Physical Therapy

## 2020-05-18 ENCOUNTER — Encounter: Payer: Self-pay | Admitting: Physical Therapy

## 2020-05-18 ENCOUNTER — Other Ambulatory Visit: Payer: Self-pay

## 2020-05-18 DIAGNOSIS — L599 Disorder of the skin and subcutaneous tissue related to radiation, unspecified: Secondary | ICD-10-CM

## 2020-05-18 DIAGNOSIS — M25612 Stiffness of left shoulder, not elsewhere classified: Secondary | ICD-10-CM

## 2020-05-18 DIAGNOSIS — Z483 Aftercare following surgery for neoplasm: Secondary | ICD-10-CM

## 2020-05-18 DIAGNOSIS — I890 Lymphedema, not elsewhere classified: Secondary | ICD-10-CM

## 2020-05-18 NOTE — Telephone Encounter (Signed)
R/s appts due to provider being on PAL. Called pt, no answer. Left vm with updated appts date and times.

## 2020-05-18 NOTE — Therapy (Signed)
[place], [place], [address] Phone: [phone]   Fax:  [phone]  Physical Therapy Treatment  Patient Details  Name: Madison Hopkins MRN: [id number] Date of Birth: 1956-02-18 Referring Provider (PT): Dr. Barry Dienes  Progress Note Reporting Period 04/13/2020 to 05/18/2020 See note below for Objective Data and Assessment of Progress/Goals.      Encounter Date: 05/18/2020   PT End of Session - 05/18/20 1118    Visit Number 10    Number of Visits 17    Date for PT Re-Evaluation 06/08/20    PT Start Time 0900    PT Stop Time 0955    PT Time Calculation (min) 55 min    Activity Tolerance Patient tolerated treatment well    Behavior During Therapy [facility] for tasks assessed/performed           Past Medical History:  Diagnosis Date  . Abnormal Pap smear of vagina 07/28/2016   LGSIL; colpo 07/2016 atrophic squamous cells; colpo 07/2017 - atypia  . Allergy   . Breast cancer ([place]) 2018   Metastatic Left breast  . Elevated hemoglobin A1c 07/28/2016   level - 6.1  . Endometriosis   . GERD (gastroesophageal reflux disease)   . HSV-2 infection   . Iron deficiency anemia   . Low vitamin D level 07/28/2016   level 24.7  . Personal history of chemotherapy    finished 10/19  . Personal history of radiation therapy    finished 03/2018  . Thyroid cancer ([place])   . Thyroid neoplasm     Past Surgical History:  Procedure Laterality Date  . ABDOMINAL HYSTERECTOMY    . BREAST BIOPSY Left 08/09/2017  . BREAST LUMPECTOMY Left 01/22/2018  . BREAST LUMPECTOMY WITH RADIOACTIVE SEED AND AXILLARY LYMPH NODE DISSECTION Left 01/22/2018   Procedure: LEFT BREAST LUMPECTOMY WITH RADIOACTIVE SEED AND COMPLETE LEFT AXILLARY LYMPH NODE DISSECTION;  Surgeon: [name], MD;  Location: [place];  Service: General;  Laterality: Left;  . CESAREAN SECTION    . COLON SURGERY    . COLOSTOMY    . COLOSTOMY  REVERSAL    . FOOT SURGERY Right    done spur  . OOPHORECTOMY    . PORT-A-CATH REMOVAL Right 08/28/2018   Procedure: REMOVAL PORT-A-CATH;  Surgeon: [name], MD;  Location: [place];  Service: General;  Laterality: Right;  . PORTACATH PLACEMENT N/A 09/06/2017   Procedure: INSERTION PORT-A-CATH;  Surgeon: [name], MD;  Location: [place];  Service: General;  Laterality: N/A;  . SMALL INTESTINE SURGERY    . SPINE SURGERY     Injections  . THYROIDECTOMY N/A 09/20/2019   Procedure: TOTAL THYROIDECTOMY;  Surgeon: [name], MD;  [facility];  Service: General;  Laterality: N/A;    There were no vitals filed for this visit.   Subjective Assessment - 05/18/20 0906    Subjective pt comes in wearing her sleeve.  her main concern is the fullness in her upper arm    Currently in Pain? Yes    Pain Score 7     Pain Location Arm    Pain Orientation Left    Pain Descriptors / Indicators Aching;Sore    Pain Type Chronic pain    Pain Onset More than a month ago    Pain Frequency Intermittent    Aggravating Factors  laying on that side    Pain Relieving Factors uses pillows to support arm  LYMPHEDEMA/ONCOLOGY QUESTIONNAIRE - 05/18/20 0001      Left Upper Extremity Lymphedema   15 cm Proximal to Olecranon Process 34.7 cm    Olecranon Process 28.8 cm    15 cm Proximal to Ulnar Styloid Process 26.7 cm    Just Proximal to Ulnar Styloid Process 17 cm    Across Hand at [employer] 19.6 cm    At [place] of 2nd Digit 6.5 cm                      [facility] Adult PT Treatment/Exercise - 05/18/20 0001      Manual Therapy   Manual Therapy Soft tissue mobilization;Manual Lymphatic Drainage (MLD);Edema management    Edema Management remeasuared arm    Soft tissue mobilization to L pec and L serratus - numerous areas of tightness and trigger points palpable with pt reporting increased tenderness that did ease today with treatment, pt felt  less tight at end of session    Manual Lymphatic Drainage (MLD) In supine: short neck, 5 diaphragmatic breaths, right axillary nodes and establishment of interaxillary pathway, left inguinal nodes and establishment of axillo inguinal pathway then to L UE from elbow to shoulder moving fluid towards pathways and retracing all steps then to right sidelying for posterior interaxillary anastamosis and mroe work on upper arm                       PT Long Term Goals - 05/11/20 0910      PT LONG TERM GOAL #1   Title Pt will report no tightness across L chest at end range abduction to allow improved mobility.    Baseline 05/11/20- pt still reports tightness with this    Time 4    Period Weeks    Status On-going      PT LONG TERM GOAL #2   Title Pt will be independent in self MLD for long term management of lymphedema.    Baseline 05/11/20- pt is independent with this    Time 4    Period Weeks    Status Achieved      PT LONG TERM GOAL #3   Title Pt will obtain appropriate compression garments for long term management of lymphedema.    Baseline 05/11/20- pt received her sleeve and glove and has been wearing it    Time 4    Period Weeks    Status Achieved      PT LONG TERM GOAL #4   Title Pt will be independent in a home exercise program for continued strengthening and stretching.    Time 4    Period Weeks    Status Achieved                 Plan - 05/18/20 1118    Clinical Impression Statement Pt has had reducation in almost all of her left arm measurements but still has visible palpable and visible lymphedema in her left upper arm above elbow and below top of sleeve. She received benefit from treatment today with less symptoms in this area. She will contact [employer] this week to see if it will make a difference when she goes to Medicare next month.    Personal Factors and Comorbidities Time since onset of injury/illness/exacerbation    Stability/Clinical Decision Making  Stable/Uncomplicated    Rehab Potential Excellent    PT Frequency 2x / week    PT Duration 4 weeks    PT Treatment/Interventions  ADLs/Self Care Home Management;Therapeutic exercise;Manual techniques;Manual lymph drainage;Passive range of motion;Taping;Compression bandaging;Patient/family education;Scar mobilization;Vasopneumatic Device    PT Next Visit Plan STM to L pec and serratus, continue with MLD remeasure L UE circumferences prn    Consulted and Agree with Plan of Care Patient           Patient will benefit from skilled therapeutic intervention in order to improve the following deficits and impairments:  Pain,Postural dysfunction,Increased fascial restricitons,Decreased knowledge of precautions,Decreased range of motion,Decreased scar mobility,Increased edema  Visit Diagnosis: Stiffness of left shoulder, not elsewhere classified  Lymphedema, not elsewhere classified  Aftercare following surgery for neoplasm  Disorder of the skin and subcutaneous tissue related to radiation, unspecified     Problem List Patient Active Problem List   Diagnosis Date Noted  . Papillary thyroid carcinoma ([place]) 04/07/2020  . Post-operative hypothyroidism 10/02/2019  . Neoplasm of uncertain behavior of thyroid gland 09/16/2019  . Multiple thyroid nodules 09/16/2019  . COVID-19 virus infection 02/20/2019  . Environmental allergies 01/12/2018  . Port-A-Cath in place 11/09/2017  . Malignant neoplasm of upper-outer quadrant of left breast in female, estrogen receptor positive ([place]) 08/10/2017   Donato Heinz. Owens Shark PT  [place] 05/18/2020, [place], [place], [address] Phone: [phone]   Fax:  [phone]  Name: Madison Hopkins MRN: [id number] Date of Birth: [birth year]

## 2020-05-20 ENCOUNTER — Other Ambulatory Visit: Payer: Self-pay

## 2020-05-20 ENCOUNTER — Encounter: Payer: Self-pay | Admitting: Physical Therapy

## 2020-05-20 ENCOUNTER — Ambulatory Visit: Payer: 59 | Admitting: Physical Therapy

## 2020-05-20 DIAGNOSIS — Z483 Aftercare following surgery for neoplasm: Secondary | ICD-10-CM

## 2020-05-20 DIAGNOSIS — I890 Lymphedema, not elsewhere classified: Secondary | ICD-10-CM

## 2020-05-20 NOTE — Therapy (Addendum)
[facility] [address] [place], [place], [address] Phone: [phone]   Fax:  [phone]  Physical Therapy Treatment  Patient Details  Name: Madison Hopkins MRN: [id number] Date of Birth: Jun 18, 1955 Referring Provider (PT): Dr. Donell Beers   Encounter Date: 05/20/2020   PT End of Session - 05/20/20 1013    Visit Number 11    Number of Visits 17    Date for PT Re-Evaluation 06/08/20    PT Start Time 1000    PT Stop Time 1058    PT Time Calculation (min) 58 min    Activity Tolerance Patient tolerated treatment well    Behavior During Therapy [facility] for tasks assessed/performed           Past Medical History:  Diagnosis Date  . Abnormal Pap smear of vagina 07/28/2016   LGSIL; colpo 07/2016 atrophic squamous cells; colpo 07/2017 - atypia  . Allergy   . Breast cancer (HCC) 2018   Metastatic Left breast  . Elevated hemoglobin A1c 07/28/2016   level - 6.1  . Endometriosis   . GERD (gastroesophageal reflux disease)   . HSV-2 infection   . Iron deficiency anemia   . Low vitamin D level 07/28/2016   level 24.7  . Personal history of chemotherapy    finished 10/19  . Personal history of radiation therapy    finished 03/2018  . Thyroid cancer (HCC)   . Thyroid neoplasm     Past Surgical History:  Procedure Laterality Date  . ABDOMINAL HYSTERECTOMY    . BREAST BIOPSY Left 08/09/2017  . BREAST LUMPECTOMY Left 01/22/2018  . BREAST LUMPECTOMY WITH RADIOACTIVE SEED AND AXILLARY LYMPH NODE DISSECTION Left 01/22/2018   Procedure: LEFT BREAST LUMPECTOMY WITH RADIOACTIVE SEED AND COMPLETE LEFT AXILLARY LYMPH NODE DISSECTION;  Surgeon: Claud Kelp, MD;  Location: [facility];  Service: General;  Laterality: Left;  . CESAREAN SECTION    . COLON SURGERY    . COLOSTOMY    . COLOSTOMY REVERSAL    . FOOT SURGERY Right    done spur  . OOPHORECTOMY    . PORT-A-CATH REMOVAL Right 08/28/2018   Procedure: REMOVAL  PORT-A-CATH;  Surgeon: Claud Kelp, MD;  Location: [facility];  Service: General;  Laterality: Right;  . PORTACATH PLACEMENT N/A 09/06/2017   Procedure: INSERTION PORT-A-CATH;  Surgeon: Ovidio Kin, MD;  Location: [facility];  Service: General;  Laterality: N/A;  . SMALL INTESTINE SURGERY    . SPINE SURGERY     Injections  . THYROIDECTOMY N/A 09/20/2019   Procedure: TOTAL THYROIDECTOMY;  Surgeon: Darnell Level, MD;  [facility];  Service: General;  Laterality: N/A;    There were no vitals filed for this visit.   Subjective Assessment - 05/20/20 0903    Subjective I am still having swelling in the upper arm.    Pertinent History status post left breast upper outer quadrant and left axillary lymph node biopsy 08/07/2017, both positive for a T2 N1, stage IIB invasive ductal carcinoma, grade 3, estrogen receptor weakly positive, progesterone receptor negative, but HER-2 amplified, with an MIB-1 of 80%, staging chest CT and bone scan 08/30/2017 showed no evidence of metastatic disease, completed neoadjuvant chemo, Left lumpectomy on 01/22/2018 shows a ypT1b pN1a, grade 3 residual invasive ductal carcinoma, agnostic panel HER-2 negative, ER 40% weakly positive, PR negative, total of 11 axillary lymph nodes removed (1+), adjuvant radiation completed 04/20/18, started anastrozole 05/30/2018, status post total thyroidectomy 09/20/2019 for papillary thyroid carcinoma  Patient Stated Goals get my swelling down, have my scar looked at    Currently in Pain? Yes    Pain Score 7     Pain Location Axilla    Pain Orientation Left    Pain Descriptors / Indicators Aching    Pain Type Chronic pain    Pain Onset More than a month ago    Pain Frequency Intermittent                             [facility] Adult PT Treatment/Exercise - 05/20/20 0001      Manual Therapy   Edema Management sent email to [employer] to check on FlexiTouch pump as pt is thinking she may be interested     Soft tissue mobilization to L pec and L serratus - less trigger points noted today but pt still having point tenderness    Manual Lymphatic Drainage (MLD) In supine: short neck, 5 diaphragmatic breaths, right axillary nodes and establishment of interaxillary pathway, left inguinal nodes and establishment of axillo inguinal pathway then to L UE from elbow to shoulder moving fluid towards pathways and retracing all steps then to right sidelying for posterior interaxillary anastamosis and mroe work on upper arm                       PT Long Term Goals - 05/11/20 0910      PT LONG TERM GOAL #1   Title Pt will report no tightness across L chest at end range abduction to allow improved mobility.    Baseline 05/11/20- pt still reports tightness with this    Time 4    Period Weeks    Status On-going      PT LONG TERM GOAL #2   Title Pt will be independent in self MLD for long term management of lymphedema.    Baseline 05/11/20- pt is independent with this    Time 4    Period Weeks    Status Achieved      PT LONG TERM GOAL #3   Title Pt will obtain appropriate compression garments for long term management of lymphedema.    Baseline 05/11/20- pt received her sleeve and glove and has been wearing it    Time 4    Period Weeks    Status Achieved      PT LONG TERM GOAL #4   Title Pt will be independent in a home exercise program for continued strengthening and stretching.    Time 4    Period Weeks    Status Achieved                 Plan - 05/20/20 1015    Clinical Impression Statement Continued with MLD to LUE as her LUE is still presenting with fullness and thickness with visible lymphedema in left upper arm above the top and of the sleeve. Pt has now failed 4 weeks of conservative treatment including MLD, comrpession garments and elevations for lymphedema and still has fullness in the upper arm above where her sleeve ends. She had a trial on 04/21/20  of a basic pump and  failed due to increased edema at chest. She has been attempting self MLD but is still requiring mod verbal cues for correct pressure and sequence. She would benefit from a FlexiTouch compression pump to help her independently manage her LUE lymphedema and decrease risk of cellulitis.    PT Frequency  2x / week    PT Duration 4 weeks    PT Treatment/Interventions ADLs/Self Care Home Management;Therapeutic exercise;Manual techniques;Manual lymph drainage;Passive range of motion;Taping;Compression bandaging;Patient/family education;Scar mobilization;Vasopneumatic Device    PT Next Visit Plan STM to L pec and serratus, continue with MLD remeasure L UE circumferences prn    PT Home Exercise Plan Access Code: [email or web address]    Consulted and Agree with Plan of Care Patient           Patient will benefit from skilled therapeutic intervention in order to improve the following deficits and impairments:  Pain,Postural dysfunction,Increased fascial restricitons,Decreased knowledge of precautions,Decreased range of motion,Decreased scar mobility,Increased edema  Visit Diagnosis: Lymphedema, not elsewhere classified  Aftercare following surgery for neoplasm     Problem List Patient Active Problem List   Diagnosis Date Noted  . Papillary thyroid carcinoma ([place]) 04/07/2020  . Post-operative hypothyroidism 10/02/2019  . Neoplasm of uncertain behavior of thyroid gland 09/16/2019  . Multiple thyroid nodules 09/16/2019  . COVID-19 virus infection 02/20/2019  . Environmental allergies 01/12/2018  . Port-A-Cath in place 11/09/2017  . Malignant neoplasm of upper-outer quadrant of left breast in female, estrogen receptor positive ([place]) 08/10/2017    Allyson Sabal [facility] 05/20/2020, 10:23 AM  [place], [place], [address] Phone: [phone]   Fax:  [phone]  Name: Madison Hopkins MRN: [id number] Date of Birth:  09-01-1955  Manus Gunning, PT 05/20/20 10:23 AM

## 2020-05-25 ENCOUNTER — Ambulatory Visit: Payer: 59 | Admitting: Physical Therapy

## 2020-05-25 ENCOUNTER — Encounter: Payer: Self-pay | Admitting: Physical Therapy

## 2020-05-25 ENCOUNTER — Other Ambulatory Visit: Payer: Self-pay

## 2020-05-25 DIAGNOSIS — Z483 Aftercare following surgery for neoplasm: Secondary | ICD-10-CM

## 2020-05-25 DIAGNOSIS — I890 Lymphedema, not elsewhere classified: Secondary | ICD-10-CM

## 2020-05-25 NOTE — Therapy (Signed)
[place], [place], [address] Phone: [phone]   Fax:  [phone]  Physical Therapy Treatment  Patient Details  Name: Madison Hopkins MRN: [id number] Date of Birth: 28-Oct-1955 Referring Provider (PT): Dr. Barry Dienes   Encounter Date: 05/25/2020   PT End of Session - 05/25/20 1656    Visit Number 12    Number of Visits 17    Date for PT Re-Evaluation 06/08/20    PT Start Time 1608    PT Stop Time 1656    PT Time Calculation (min) 48 min    Activity Tolerance Patient tolerated treatment well    Behavior During Therapy [facility] for tasks assessed/performed           Past Medical History:  Diagnosis Date  . Abnormal Pap smear of vagina 07/28/2016   LGSIL; colpo 07/2016 atrophic squamous cells; colpo 07/2017 - atypia  . Allergy   . Breast cancer ([place]) 2018   Metastatic Left breast  . Elevated hemoglobin A1c 07/28/2016   level - 6.1  . Endometriosis   . GERD (gastroesophageal reflux disease)   . HSV-2 infection   . Iron deficiency anemia   . Low vitamin D level 07/28/2016   level 24.7  . Personal history of chemotherapy    finished 10/19  . Personal history of radiation therapy    finished 03/2018  . Thyroid cancer ([place])   . Thyroid neoplasm     Past Surgical History:  Procedure Laterality Date  . ABDOMINAL HYSTERECTOMY    . BREAST BIOPSY Left 08/09/2017  . BREAST LUMPECTOMY Left 01/22/2018  . BREAST LUMPECTOMY WITH RADIOACTIVE SEED AND AXILLARY LYMPH NODE DISSECTION Left 01/22/2018   Procedure: LEFT BREAST LUMPECTOMY WITH RADIOACTIVE SEED AND COMPLETE LEFT AXILLARY LYMPH NODE DISSECTION;  Surgeon: [name], MD;  Location: [place];  Service: General;  Laterality: Left;  . CESAREAN SECTION    . COLON SURGERY    . COLOSTOMY    . COLOSTOMY REVERSAL    . FOOT SURGERY Right    done spur  . OOPHORECTOMY    . PORT-A-CATH REMOVAL Right 08/28/2018   Procedure: REMOVAL  PORT-A-CATH;  Surgeon: [name], MD;  Location: [place];  Service: General;  Laterality: Right;  . PORTACATH PLACEMENT N/A 09/06/2017   Procedure: INSERTION PORT-A-CATH;  Surgeon: [name], MD;  Location: [place];  Service: General;  Laterality: N/A;  . SMALL INTESTINE SURGERY    . SPINE SURGERY     Injections  . THYROIDECTOMY N/A 09/20/2019   Procedure: TOTAL THYROIDECTOMY;  Surgeon: [name], MD;  [facility];  Service: General;  Laterality: N/A;    There were no vitals filed for this visit.   Subjective Assessment - 05/25/20 1609    Subjective I got the FlexiTouch. It got delivered today.    Pertinent History status post left breast upper outer quadrant and left axillary lymph node biopsy 08/07/2017, both positive for a T2 N1, stage IIB invasive ductal carcinoma, grade 3, estrogen receptor weakly positive, progesterone receptor negative, but HER-2 amplified, with an MIB-1 of 80%, staging chest CT and bone scan 08/30/2017 showed no evidence of metastatic disease, completed neoadjuvant chemo, Left lumpectomy on 01/22/2018 shows a ypT1b pN1a, grade 3 residual invasive ductal carcinoma, agnostic panel HER-2 negative, ER 40% weakly positive, PR negative, total of 11 axillary lymph nodes removed (1+), adjuvant radiation completed 04/20/18, started anastrozole 05/30/2018, status post total thyroidectomy 09/20/2019 for papillary thyroid carcinoma  Patient Stated Goals get my swelling down, have my scar looked at    Currently in Pain? No/denies    Pain Score 0-No pain                             [facility] Adult PT Treatment/Exercise - 05/25/20 0001      Manual Therapy   Soft tissue mobilization to L pec and L serratus - less trigger points noted today but pt still having point tenderness    Manual Lymphatic Drainage (MLD) In supine: short neck, 5 diaphragmatic breaths, right axillary nodes and establishment of interaxillary pathway, left inguinal  nodes and establishment of axillo inguinal pathway then to L UE from elbow to shoulder moving fluid towards pathways and retracing all steps then to right sidelying for posterior interaxillary anastamosis and mroe work on upper arm                       PT Long Term Goals - 05/11/20 0910      PT LONG TERM GOAL #1   Title Pt will report no tightness across L chest at end range abduction to allow improved mobility.    Baseline 05/11/20- pt still reports tightness with this    Time 4    Period Weeks    Status On-going      PT LONG TERM GOAL #2   Title Pt will be independent in self MLD for long term management of lymphedema.    Baseline 05/11/20- pt is independent with this    Time 4    Period Weeks    Status Achieved      PT LONG TERM GOAL #3   Title Pt will obtain appropriate compression garments for long term management of lymphedema.    Baseline 05/11/20- pt received her sleeve and glove and has been wearing it    Time 4    Period Weeks    Status Achieved      PT LONG TERM GOAL #4   Title Pt will be independent in a home exercise program for continued strengthening and stretching.    Time 4    Period Weeks    Status Achieved                 Plan - 05/25/20 1657    Clinical Impression Statement Continued with MLD to LUE today. Pt just received her compression pump and has to schedule someone to come and show her how to use it. Her trigger points in left serratus and pec are improving since last session. Once pt is independent with use of the compression pump and no longer demonstrates trigger points she will be ready for discharge from skilled PT services.    PT Frequency 2x / week    PT Duration 4 weeks    PT Treatment/Interventions ADLs/Self Care Home Management;Therapeutic exercise;Manual techniques;Manual lymph drainage;Passive range of motion;Taping;Compression bandaging;Patient/family education;Scar mobilization;Vasopneumatic Device    PT Next Visit Plan  STM to L pec and serratus, continue with MLD remeasure L UE circumferences prn    Consulted and Agree with Plan of Care Patient           Patient will benefit from skilled therapeutic intervention in order to improve the following deficits and impairments:  Pain,Postural dysfunction,Increased fascial restricitons,Decreased knowledge of precautions,Decreased range of motion,Decreased scar mobility,Increased edema  Visit Diagnosis: Lymphedema, not elsewhere classified  Aftercare following surgery for neoplasm  Problem List Patient Active Problem List   Diagnosis Date Noted  . Papillary thyroid carcinoma ([place]) 04/07/2020  . Post-operative hypothyroidism 10/02/2019  . Neoplasm of uncertain behavior of thyroid gland 09/16/2019  . Multiple thyroid nodules 09/16/2019  . COVID-19 virus infection 02/20/2019  . Environmental allergies 01/12/2018  . Port-A-Cath in place 11/09/2017  . Malignant neoplasm of upper-outer quadrant of left breast in female, estrogen receptor positive ([place]) 08/10/2017    Allyson Sabal [facility] 05/25/2020, 5:00 PM  [place], [place], [address] Phone: [phone]   Fax:  [phone]  Name: RIGBY SWAMY MRN: [id number] Date of Birth: [birth year]  Manus Gunning, PT 05/25/20 5:00 PM

## 2020-05-28 ENCOUNTER — Ambulatory Visit: Payer: 59 | Admitting: Physical Therapy

## 2020-05-28 ENCOUNTER — Other Ambulatory Visit: Payer: Self-pay

## 2020-05-28 ENCOUNTER — Encounter: Payer: Self-pay | Admitting: Physical Therapy

## 2020-05-28 DIAGNOSIS — I890 Lymphedema, not elsewhere classified: Secondary | ICD-10-CM | POA: Diagnosis not present

## 2020-05-28 DIAGNOSIS — L599 Disorder of the skin and subcutaneous tissue related to radiation, unspecified: Secondary | ICD-10-CM

## 2020-05-28 DIAGNOSIS — M25612 Stiffness of left shoulder, not elsewhere classified: Secondary | ICD-10-CM

## 2020-05-28 DIAGNOSIS — Z483 Aftercare following surgery for neoplasm: Secondary | ICD-10-CM

## 2020-05-28 DIAGNOSIS — C50912 Malignant neoplasm of unspecified site of left female breast: Secondary | ICD-10-CM

## 2020-05-28 NOTE — Therapy (Addendum)
[place], [place], [address] Phone: [phone]   Fax:  [phone]  Physical Therapy Treatment  Patient Details  Name: Madison Hopkins MRN: [id number] Date of Birth: January 20, 1956 Referring Provider (PT): Dr. Barry Dienes   Encounter Date: 05/28/2020   PT End of Session - 05/28/20 0853     Visit Number 13    Number of Visits 17    Date for PT Re-Evaluation 06/08/20    PT Start Time 0804    PT Stop Time 5643    PT Time Calculation (min) 52 min    Activity Tolerance Patient tolerated treatment well    Behavior During Therapy [facility] for tasks assessed/performed             Past Medical History:  Diagnosis Date   Abnormal Pap smear of vagina 07/28/2016   LGSIL; colpo 07/2016 atrophic squamous cells; colpo 07/2017 - atypia   Allergy    Breast cancer ([place]) 2018   Metastatic Left breast   Elevated hemoglobin A1c 07/28/2016   level - 6.1   Endometriosis    GERD (gastroesophageal reflux disease)    HSV-2 infection    Iron deficiency anemia    Low vitamin D level 07/28/2016   level 24.7   Personal history of chemotherapy    finished 10/19   Personal history of radiation therapy    finished 03/2018   Thyroid cancer ([place])    Thyroid neoplasm     Past Surgical History:  Procedure Laterality Date   ABDOMINAL HYSTERECTOMY     BREAST BIOPSY Left 08/09/2017   BREAST LUMPECTOMY Left 01/22/2018   BREAST LUMPECTOMY WITH RADIOACTIVE SEED AND AXILLARY LYMPH NODE DISSECTION Left 01/22/2018   Procedure: LEFT BREAST LUMPECTOMY WITH RADIOACTIVE SEED AND COMPLETE LEFT AXILLARY LYMPH NODE DISSECTION;  Surgeon: [name], MD;  Location: [place];  Service: General;  Laterality: Left;   CESAREAN SECTION     COLON SURGERY     COLOSTOMY     COLOSTOMY REVERSAL     FOOT SURGERY Right    done spur   OOPHORECTOMY     PORT-A-CATH REMOVAL Right 08/28/2018   Procedure: REMOVAL PORT-A-CATH;  Surgeon:  [name], MD;  Location: [place];  Service: General;  Laterality: Right;   PORTACATH PLACEMENT N/A 09/06/2017   Procedure: INSERTION PORT-A-CATH;  Surgeon: [name], MD;  Location: [place];  Service: General;  Laterality: N/A;   SMALL INTESTINE SURGERY     SPINE SURGERY     Injections   THYROIDECTOMY N/A 09/20/2019   Procedure: TOTAL THYROIDECTOMY;  Surgeon: [name], MD;  [facility];  Service: General;  Laterality: N/A;    There were no vitals filed for this visit.   Subjective Assessment - 05/28/20 0807     Subjective I am getting trained on the Flexi next week.    Pertinent History status post left breast upper outer quadrant and left axillary lymph node biopsy 08/07/2017, both positive for a T2 N1, stage IIB invasive ductal carcinoma, grade 3, estrogen receptor weakly positive, progesterone receptor negative, but HER-2 amplified, with an MIB-1 of 80%, staging chest CT and bone scan 08/30/2017 showed no evidence of metastatic disease, completed neoadjuvant chemo, Left lumpectomy on 01/22/2018 shows a ypT1b pN1a, grade 3 residual invasive ductal carcinoma, agnostic panel HER-2 negative, ER 40% weakly positive, PR negative, total of 11 axillary lymph nodes removed (1+), adjuvant radiation completed 04/20/18, started anastrozole 05/30/2018, status post total thyroidectomy 09/20/2019  for papillary thyroid carcinoma    Patient Stated Goals get my swelling down, have my scar looked at    Currently in Pain? No/denies    Pain Score 0-No pain                   LYMPHEDEMA/ONCOLOGY QUESTIONNAIRE - 05/28/20 0001       Left Upper Extremity Lymphedema   15 cm Proximal to Olecranon Process 34.5 cm    Olecranon Process 30 cm    15 cm Proximal to Ulnar Styloid Process 26.5 cm    Just Proximal to Ulnar Styloid Process 17.4 cm    Across Hand at [employer] 20.5 cm    At [place] of 2nd Digit 6.8 cm                        [facility] Adult PT  Treatment/Exercise - 05/28/20 0001       Manual Therapy   Soft tissue mobilization to L pec throughout PROM    Manual Lymphatic Drainage (MLD) In supine: short neck, 5 diaphragmatic breaths, right axillary nodes and establishment of interaxillary pathway, left inguinal nodes and establishment of axillo inguinal pathway then to L UE from elbow to shoulder moving fluid towards pathways and retracing all steps then to right sidelying for posterior interaxillary anastamosis and mroe work on upper arm    Passive ROM to L shoulder in to flexion, abduction with STM to L pec in area of tightness                         PT Long Term Goals - 05/28/20 0807       PT LONG TERM GOAL #1   Title Pt will report no tightness across L chest at end range abduction to allow improved mobility.    Baseline 05/11/20- pt still reports tightness with this; 05/28/20- pt reports still having some tightness with this-it has improved some    Time 4    Period Weeks    Status On-going      PT LONG TERM GOAL #2   Title Pt will be independent in self MLD for long term management of lymphedema.    Baseline 05/11/20- pt is independent with this    Time 4    Period Weeks    Status Achieved      PT LONG TERM GOAL #3   Title Pt will obtain appropriate compression garments for long term management of lymphedema.    Baseline 05/11/20- pt received her sleeve and glove and has been wearing it    Time 4    Period Weeks    Status Achieved      PT LONG TERM GOAL #4   Title Pt will be independent in a home exercise program for continued strengthening and stretching.    Time 4    Period Weeks    Status Achieved                   Plan - 05/28/20 0853     Clinical Impression Statement Assessed pt's progress towards goals in therapy. Pt will be trained on how to use the [place] next week. Focused on L shoulder PROM to help decrease L pec tightness. Educated pt to continue stretching at home. Pt would  like to be placed on hold for a month and she if she is able to manage her lymphedema and pec tightness independently.  PT Frequency [place]    PT Treatment/Interventions ADLs/Self Care Home Management;Therapeutic exercise;Manual techniques;Manual lymph drainage;Passive range of motion;Taping;Compression bandaging;Patient/family education;Scar mobilization;Vasopneumatic Device    PT Next Visit Plan reassess pec tightness and UE circumferences - STM to L pec and serratus, continue with MLD remeasure L UE circumferences prn    PT Home Exercise Plan Access Code: [email or web address]    Consulted and Agree with Plan of Care Patient             Patient will benefit from skilled therapeutic intervention in order to improve the following deficits and impairments:  Pain,Postural dysfunction,Increased fascial restricitons,Decreased knowledge of precautions,Decreased range of motion,Decreased scar mobility,Increased edema  Visit Diagnosis: Lymphedema, not elsewhere classified  Aftercare following surgery for neoplasm  Stiffness of left shoulder, not elsewhere classified  Disorder of the skin and subcutaneous tissue related to radiation, unspecified  Malignant neoplasm of left female breast, unspecified estrogen receptor status, unspecified site of breast [facility])     Problem List Patient Active Problem List   Diagnosis Date Noted   Papillary thyroid carcinoma ([place]) 04/07/2020   Post-operative hypothyroidism 10/02/2019   Neoplasm of uncertain behavior of thyroid gland 09/16/2019   Multiple thyroid nodules 09/16/2019   COVID-19 virus infection 02/20/2019   Environmental allergies 01/12/2018   Port-A-Cath in place 11/09/2017   Malignant neoplasm of upper-outer quadrant of left breast in female, estrogen receptor positive ([place]) 08/10/2017    Allyson Sabal [facility] 05/28/2020, 8:59 AM  [place], [place], [address] Phone:  [phone]   Fax:  [phone]  Name: Madison Hopkins MRN: [id number] Date of Birth: 02-14-1956  Manus Gunning, PT 05/28/20 8:59 AM  PHYSICAL THERAPY DISCHARGE SUMMARY  Visits from Start of Care: 13  Current functional level related to goals / functional outcomes: See above   Remaining deficits: See above   Education / Equipment: MLD, HEP, lymphedema   Patient agrees to discharge. Patient goals were partially met. Patient is being discharged due to not returning since the last visit.  Allyson Sabal [place], [place] 03/28/22 1:07 PM

## 2020-06-11 ENCOUNTER — Encounter: Payer: 59 | Admitting: Adult Health

## 2020-06-23 ENCOUNTER — Other Ambulatory Visit: Payer: Self-pay | Admitting: Physician Assistant

## 2020-06-25 ENCOUNTER — Encounter: Payer: 59 | Admitting: Adult Health

## 2020-06-25 ENCOUNTER — Inpatient Hospital Stay: Payer: 59 | Attending: Adult Health | Admitting: Adult Health

## 2020-06-25 ENCOUNTER — Other Ambulatory Visit: Payer: Self-pay

## 2020-06-25 ENCOUNTER — Encounter: Payer: Self-pay | Admitting: Adult Health

## 2020-06-25 VITALS — BP 115/62 | HR 79 | Temp 97.6°F | Resp 18 | Wt 200.1 lb

## 2020-06-25 DIAGNOSIS — F1290 Cannabis use, unspecified, uncomplicated: Secondary | ICD-10-CM | POA: Diagnosis not present

## 2020-06-25 DIAGNOSIS — C50412 Malignant neoplasm of upper-outer quadrant of left female breast: Secondary | ICD-10-CM | POA: Diagnosis not present

## 2020-06-25 DIAGNOSIS — M2550 Pain in unspecified joint: Secondary | ICD-10-CM | POA: Insufficient documentation

## 2020-06-25 DIAGNOSIS — C773 Secondary and unspecified malignant neoplasm of axilla and upper limb lymph nodes: Secondary | ICD-10-CM | POA: Insufficient documentation

## 2020-06-25 DIAGNOSIS — Z79811 Long term (current) use of aromatase inhibitors: Secondary | ICD-10-CM | POA: Insufficient documentation

## 2020-06-25 DIAGNOSIS — Z923 Personal history of irradiation: Secondary | ICD-10-CM | POA: Insufficient documentation

## 2020-06-25 DIAGNOSIS — Z170 Estrogen receptor positive status [ER+]: Secondary | ICD-10-CM | POA: Diagnosis not present

## 2020-06-25 DIAGNOSIS — Z85850 Personal history of malignant neoplasm of thyroid: Secondary | ICD-10-CM | POA: Diagnosis not present

## 2020-06-25 NOTE — Progress Notes (Signed)
[place]  [phone] [phone]    ID: Madison Hopkins DOB: [birth year]  MR#: [id number]  YQI#:[id number]  Patient Care Team: [name], PA as PCP - General (Physician Assistant) [name], [name] Dad, MD as Consulting Physician (Oncology) Kyung Rudd, MD as Consulting Physician (Radiation Oncology) [name], [name] All, MD as Consulting Physician (Obstetrics and Gynecology) Bensimhon, [name], MD as Consulting Physician (Cardiology) [name], MD as Attending Physician (Physical Medicine and Rehabilitation) [facility], [name], MD as Consulting Physician (Endocrinology) OTHER MD:   CHIEF COMPLAINT: HER-2 positive breast cancer  CURRENT TREATMENT: Anastrozole   INTERVAL HISTORY: Madison Hopkins returns today for follow-up of her HER-2 positive, weakly estrogen receptor positive breast cancer.   Madison Hopkins continues on Anastrozole with pretty good tolerance.  She does note some mild arthralgias and is working through those.  She is celebrating because she retired this week.  She has joined silver sneakers and is looking forward to the next journey her life is on.    REVIEW OF SYSTEMS: Madison Hopkins is up to date with her breast cancer screening.  She sees her PCP regularly and is up to date with her other cancer screenings.  A detailed ROS was otherwise non contributory.   HISTORY OF CURRENT ILLNESS: From the original intake note:  Madison Hopkins noted a mass in the left axilla sometime in January or February 2019.  She eventually brought her to her gynecologist's attention, and underwent bilateral diagnostic mammography with tomography and left breast ultrasonography at [facility] on 08/04/2017 showing: breast density category B. There is a highly suspicious hypoechoic mass in the left breast at the 2 o'clock upper outer quadrant measuring 2.3 x 1.6 x 2.2 cm, located 2 cm from the nipple. Sonographically, there were 2 enlarged lymph nodes in  the left axilla, the largest with cortical thickening measuring 2.5 cm. No evidence of malignancy was seen in the right breast.   Accordingly on 08/07/2017 she proceeded to biopsy of the left breast area and 1 of the lymph nodes in question. The pathology from this procedure showed ([id number]): Invasive ductal carcinoma, grade 3. Metastatic carcinoma was found in one left axillary lymph node. Prognostic indicators significant for: estrogen receptor, 30% positive with weak staining intensity and progesterone receptor, 0% negative. Proliferation marker Ki67 at 80%. HER2 amplified with ratios HER2/CEP17 signals 2.32 and average HER2 copies per cell 6.60  The patient's subsequent history is as detailed below.   PAST MEDICAL HISTORY: Past Medical History:  Diagnosis Date  . Abnormal Pap smear of vagina 07/28/2016   LGSIL; colpo 07/2016 atrophic squamous cells; colpo 07/2017 - atypia  . Allergy   . Breast cancer ([place]) 2018   Metastatic Left breast  . Elevated hemoglobin A1c 07/28/2016   level - 6.1  . Endometriosis   . GERD (gastroesophageal reflux disease)   . HSV-2 infection   . Iron deficiency anemia   . Low vitamin D level 07/28/2016   level 24.7  . Personal history of chemotherapy    finished 10/19  . Personal history of radiation therapy    finished 03/2018  . Thyroid cancer ([place])   . Thyroid neoplasm   GERD but no ulcers   PAST SURGICAL HISTORY: Past Surgical History:  Procedure Laterality Date  . ABDOMINAL HYSTERECTOMY    . BREAST BIOPSY Left 08/09/2017  . BREAST LUMPECTOMY Left 01/22/2018  . BREAST LUMPECTOMY WITH RADIOACTIVE SEED AND AXILLARY LYMPH NODE DISSECTION Left 01/22/2018   Procedure: LEFT BREAST LUMPECTOMY  WITH RADIOACTIVE SEED AND COMPLETE LEFT AXILLARY LYMPH NODE DISSECTION;  Surgeon: [name], MD;  Location: [facility];  Service: General;  Laterality: Left;  . CESAREAN SECTION    . COLON SURGERY    . COLOSTOMY    . COLOSTOMY REVERSAL     . FOOT SURGERY Right    done spur  . OOPHORECTOMY    . PORT-A-CATH REMOVAL Right 08/28/2018   Procedure: REMOVAL PORT-A-CATH;  Surgeon: [name], MD;  Location: [facility];  Service: General;  Laterality: Right;  . PORTACATH PLACEMENT N/A 09/06/2017   Procedure: INSERTION PORT-A-CATH;  Surgeon: [name], MD;  Location: [facility];  Service: General;  Laterality: N/A;  . SMALL INTESTINE SURGERY    . SPINE SURGERY     Injections  . THYROIDECTOMY N/A 09/20/2019   Procedure: TOTAL THYROIDECTOMY;  Surgeon: [name], MD;  Location: [facility];  Service: General;  Laterality: N/A;  Hysterectomy with salpingo-oophorectomy, Tonsillectomy, Plantar Fascitis Right Foot Surgery    FAMILY HISTORY: Family History  Problem Relation Age of Onset  . Mental illness Mother   . Alcoholism Mother   . Cirrhosis Mother   . Cancer Father   . Lung cancer Father    The patient' father died at age 77 due to lung cancer (heavy smoker). The patient's mother died due to liver cirrhosis (heavy drinker). The patient has 2 brothers and no sisters. There was a paternal 1st cousin with colon cancer diagnosed in the mid 40's, who also had cervical cancer. The mother of this 1st cousin (the patient's paternal aunt) had cancer (the patient's is unsure of what type). There was also a paternal uncle with prostate cancer diagnosed in the 6's. The patient denies a family history of breast or ovarian cancer.     GYNECOLOGIC HISTORY:  No LMP recorded (lmp unknown). Patient has had a hysterectomy. Menarche: 66 years old Age at first live birth: 65 years old She is GXP2.  The patient is status post total hysterectomy with bilateral salpingo-oophorectomy in 1995.  She never used contraception. She notes that she had an estrogen shot one time but no other HRTs.    SOCIAL HISTORY:  The patient works in Therapist, art for the tax department.  She is considering retirement at age 24.  She is single. At home  is herself and no pets. Her son, [name] is age 20 and lives in [place], [place] as a Musician. The patient's daughter [name] is age 40 and lives in [place] in Therapist, art for [employer]. The patient has 5 grandchildren and 1 great grandchild. She attends [employer].   ADVANCED DIRECTIVES: Not in place; at the 08/16/2017 visit the patient was given the appropriate forms to complete on notarized at her discretion   HEALTH MAINTENANCE: Social History   Tobacco Use  . Smoking status: Never Smoker  . Smokeless tobacco: Never Used  Vaping Use  . Vaping Use: Never used  Substance Use Topics  . Alcohol use: Yes    Alcohol/week: 1.0 standard drink    Types: 1 Glasses of wine per week  . Drug use: Not Currently    Types: Marijuana    Comment: everyday     Colonoscopy: 2009?  PAP: 10/2019, negative  Bone density: 10/24/2016, -0.7   Allergies  Allergen Reactions  . Bee Venom   . Tramadol Nausea Only  . Penicillins Nausea Only    Has patient had a PCN reaction causing immediate rash, facial/tongue/throat  swelling, SOB or lightheadedness with hypotension: No Has patient had a PCN reaction causing severe rash involving mucus membranes or skin necrosis: No Has patient had a PCN reaction that required hospitalization: No Has patient had a PCN reaction occurring within the last 10 years: Yes--nausea & headache ONLY If all of the above answers are "NO", then may proceed with Cephalosporin use.     Current Outpatient Medications  Medication Sig Dispense Refill  . acetaminophen-codeine (TYLENOL #3) 300-30 MG tablet SMARTSIG:1-2 Tablet(s) By Mouth 2-3 Times Daily PRN    . anastrozole (ARIMIDEX) 1 MG tablet TAKE 1 TABLET BY MOUTH EVERY DAY 30 tablet 14  . Artificial Tear Solution (OPTI-TEARS OP) Place 1 drop into both eyes 3 (three) times daily as needed (for dry/irritated eyes.).    Marland Kitchen Ascorbic Acid (VITAMIN C) 100 MG CHEW Chew 100 mg by mouth daily.     Marland Kitchen BORIC ACID EX Apply  topically.     . fluticasone (FLONASE) 50 MCG/ACT nasal spray SPRAY 2 SPRAYS INTO EACH NOSTRIL EVERY DAY 16 mL 2  . levothyroxine (SYNTHROID) 125 MCG tablet Take 1 tablet (125 mcg total) by mouth daily. 90 tablet 3  . lidocaine (XYLOCAINE) 5 % ointment Apply 1 application topically 3 (three) times daily. Uses as needed. 1.25 g 1  . Multiple Vitamins-Minerals (VITAMIN D3 COMPLETE PO) Take 1 tablet by mouth daily.     . naproxen sodium (ALEVE) 220 MG tablet Take 220 mg by mouth 2 (two) times daily as needed (FOR PAIN.).     Marland Kitchen Probiotic Product (ALIGN PO) Take by mouth.     . valACYclovir (VALTREX) 500 MG tablet Take 1 tablet (500 mg total) by mouth 2 (two) times daily. Take for three days as needed. 30 tablet 1   No current facility-administered medications for this visit.    OBJECTIVE: African-American woman who appears younger than stated age  83:   06/25/20 1529  BP: 115/62  Pulse: 79  Resp: 18  Temp: 97.6 F (36.4 C)  SpO2: 100%     Body mass index is 30.43 kg/m.   Wt Readings from Last 3 Encounters:  06/25/20 200 lb 1.6 oz (90.8 kg)  04/07/20 205 lb 4 oz (93.1 kg)  02/18/20 206 lb (93.4 kg)  ECOG FS:1 - Symptomatic but completely ambulatory GENERAL: Patient is a well appearing female in no acute distress HEENT:  Sclerae anicteric.  Mask in place. Neck is supple.  NODES:  No cervical, supraclavicular, or axillary lymphadenopathy palpated.  BREAST EXAM:  Left breast s/p lumpectomy and radiation with no sign of local recurrence, right breast benign.   LUNGS:  Clear to auscultation bilaterally.  No wheezes or rhonchi. HEART:  Regular rate and rhythm. No murmur appreciated. ABDOMEN:  Soft, nontender.  Positive, normoactive bowel sounds. No organomegaly palpated. MSK:  No focal spinal tenderness to palpation. Full range of motion bilaterally in the upper extremities. EXTREMITIES:  No peripheral edema.   SKIN:  Clear with no obvious rashes or skin changes. No nail  dyscrasia. NEURO:  Nonfocal. Well oriented.  Appropriate affect.    LAB RESULTS:  CMP     Component Value Date/Time   NA 142 01/14/2020 0852   K 4.1 01/14/2020 0852   CL 108 01/14/2020 0852   CO2 23 01/14/2020 0852   GLUCOSE 106 (H) 01/14/2020 0852   BUN 14 01/14/2020 0852   CREATININE 0.90 01/14/2020 0852   CALCIUM 10.2 01/14/2020 0852   PROT 7.1 01/14/2020 0852   ALBUMIN 4.1  12/12/2019 1430   AST 14 01/14/2020 0852   AST 23 10/25/2017 1055   ALT 17 01/14/2020 0852   ALT 30 10/25/2017 1055   ALKPHOS 159 (H) 12/12/2019 1430   BILITOT 0.5 01/14/2020 0852   BILITOT 0.6 10/25/2017 1055   GFRNONAA >60 12/12/2019 1430   GFRNONAA >60 10/25/2017 1055   GFRAA >60 09/21/2019 0627   GFRAA >60 10/25/2017 1055    No results found for: TOTALPROTELP, ALBUMINELP, A1GS, A2GS, BETS, BETA2SER, GAMS, MSPIKE, SPEI  No results found for: KPAFRELGTCHN, LAMBDASER, [facility]  Lab Results  Component Value Date   WBC 5.8 01/14/2020   NEUTROABS 2,999 01/14/2020   HGB 12.3 01/14/2020   HCT 38.1 01/14/2020   MCV 82.5 01/14/2020   PLT 345 01/14/2020   No results found for: LABCA2  No components found for: [id number]  No results for input(s): INR in the last 168 hours.  No results found for: LABCA2  No results found for: JWJ191  No results found for: YNW295  No results found for: AOZ308  No results found for: CA2729  No components found for: HGQUANT  No results found for: CEA1 / No results found for: CEA1   No results found for: AFPTUMOR  No results found for: CHROMOGRNA  No results found for: TOTALPROTELP, ALBUMINELP, A1GS, A2GS, BETS, BETA2SER, GAMS, MSPIKE, SPEI (this displays SPEP labs)  No results found for: KPAFRELGTCHN, LAMBDASER, KAPLAMBRATIO (kappa/lambda light chains)  No results found for: HGBA, HGBA2QUANT, HGBFQUANT, HGBSQUAN (Hemoglobinopathy evaluation)   No results found for: LDH  No results found for: IRON, TIBC, IRONPCTSAT (Iron and TIBC)  No  results found for: FERRITIN  Urinalysis    Component Value Date/Time   BILIRUBINUR n 02/01/2019 0909   KETONESUR negative 10/07/2017 0939   PROTEINUR Negative 02/01/2019 0909   UROBILINOGEN negative (A) 02/01/2019 0909   NITRITE n 02/01/2019 0909   LEUKOCYTESUR Negative 02/01/2019 0909    STUDIES: No results found.   ELIGIBLE FOR AVAILABLE RESEARCH PROTOCOL: No   ASSESSMENT: 65 y.o. [place], [place] woman status post left breast upper outer quadrant and left axillary lymph node biopsy 08/07/2017, both positive for a T2 N1, stage IIB invasive ductal carcinoma, grade 3, estrogen receptor weakly positive, progesterone receptor negative, but HER-2 amplified, with an MIB-1 of 80%  (a) staging chest CT and bone scan 08/30/2017 showed no evidence of metastatic disease  (1) neoadjuvant chemotherapy will consist of carboplatin, docetaxel, trastuzumab, and Pertuzumab given every 21 days x 6 starting 09/07/2017, perjeta omitted with cycle 2 and 3 due to diarrhea  (a) Gemcitabine substituted for Docetaxel beginning with cycle 5 and 6 for concerns of neuropathy  (2) trastuzumab continued to complete 6 months, last dose 04/06/2018  (a) echocardiogram 08/28/2017 showed an ejection fraction in the 60-65% range.  (b) echocardiogram on 11/29/2017 showed an EF of 55-60%  (c) echocardiogram 03/07/2018 showed an ejection fraction in the 55-60% range  (3) Left lumpectomy on 01/22/2018 shows a ypT1b pN1a, grade 3 residual invasive ductal carcinoma, agnostic panel HER-2 negative, ER 40% weakly positive, PR negative.   (a) foundation one testing requested on 02/02/2018  (b) total of 11 axillary lymph nodes removed (1+)  (4) adjuvant radiation completed 04/20/2018  (5) started anastrozole 05/30/2018  (a) bone density 10/25/2016 normal with a T score of -0.7  (b) bone density 11/28/2019 shows a T score of -1.5  (6) status post total thyroidectomy 09/20/2019 for papillary thyroid carcinoma   PLAN: Madison Hopkins  is doing well today.  She has no signs  of breast cancer recurrence.  She continues on Anastrozole daily with good tolerance other than arthralgias and she is exercising to help with those.   We reviewed her survivorship care plan from 2020.  She was very appreciative to receive this.  We discussed recommendations for healthy lifestyle and continuing to stay up to date with her cancer screenings.  She appears to be doing quite well.  I congratulated her on her progress and her retirement.  She will return in 6 months for labs and f/u with Dr. Jana Hakim.  She knows to call for any questions that may arise between now and her next appointment.  We are happy to see her sooner if needed.   Total encounter time 20 minutes.Wilber Bihari, NP 06/25/20 3:48 PM Medical Oncology and Hematology [facility] [place], [place] [address] Tel. [phone]    Fax. [phone]    *Total Encounter Time as defined by the Centers for Medicare and Medicaid Services includes, in addition to the face-to-face time of a patient visit (documented in the note above) non-face-to-face time: obtaining and reviewing outside history, ordering and reviewing medications, tests or procedures, care coordination (communications with other health care professionals or caregivers) and documentation in the medical record.

## 2020-07-18 ENCOUNTER — Other Ambulatory Visit: Payer: Self-pay | Admitting: Oncology

## 2020-07-20 ENCOUNTER — Encounter: Payer: Self-pay | Admitting: Oncology

## 2020-09-09 ENCOUNTER — Encounter: Payer: Self-pay | Admitting: Physician Assistant

## 2020-09-10 ENCOUNTER — Ambulatory Visit (INDEPENDENT_AMBULATORY_CARE_PROVIDER_SITE_OTHER): Payer: Medicare Other

## 2020-09-10 ENCOUNTER — Encounter: Payer: Self-pay | Admitting: Oncology

## 2020-09-10 ENCOUNTER — Encounter (HOSPITAL_COMMUNITY): Payer: Self-pay | Admitting: *Deleted

## 2020-09-10 ENCOUNTER — Ambulatory Visit (HOSPITAL_COMMUNITY)
Admission: EM | Admit: 2020-09-10 | Discharge: 2020-09-10 | Disposition: A | Discharge status: Home / Self Care | Payer: Medicare Other | Attending: Internal Medicine | Admitting: Internal Medicine

## 2020-09-10 ENCOUNTER — Other Ambulatory Visit: Payer: Self-pay

## 2020-09-10 DIAGNOSIS — R0782 Intercostal pain: Secondary | ICD-10-CM | POA: Diagnosis not present

## 2020-09-10 DIAGNOSIS — M25511 Pain in right shoulder: Secondary | ICD-10-CM

## 2020-09-10 DIAGNOSIS — S46811A Strain of other muscles, fascia and tendons at shoulder and upper arm level, right arm, initial encounter: Secondary | ICD-10-CM | POA: Diagnosis not present

## 2020-09-10 DIAGNOSIS — W19XXXA Unspecified fall, initial encounter: Secondary | ICD-10-CM

## 2020-09-10 DIAGNOSIS — S2231XA Fracture of one rib, right side, initial encounter for closed fracture: Secondary | ICD-10-CM

## 2020-09-10 DIAGNOSIS — M549 Dorsalgia, unspecified: Secondary | ICD-10-CM

## 2020-09-10 NOTE — Discharge Instructions (Addendum)
You have a right rib fracture.  Rib fractures typically heal in 1 to 3 months.  Please follow-up with [facility] if you are experiencing complications or pain after this time.    You also appear to have a right trapezius muscle strain.  You may take over-the-counter ibuprofen to decrease inflammation and help with the pain for both the right trapezius muscle strain and right rib fracture.  Please alternate ice and heat application to right upper back and ice application to right rib.

## 2020-09-10 NOTE — ED Provider Notes (Addendum)
[place]    CSN: [id number] Arrival date & time: 09/10/20  1358      History   Chief Complaint Chief Complaint  Patient presents with   Fall    HPI Madison Hopkins is a 65 y.o. female.   Patient presents to the urgent care for evaluation from a fall that occurred approximately 2 days ago at a [employer] store.  Patient states that she fell backwards over a construction site in the clothing section and landed on her back with more impact on her right side.  Patient also states that she reached up with her right arm to grab a hold of clothing/racks to break her fall. Currently having pain in the right shoulder, right scapula region, right rib cage.  Also having concern of displacement, fracture, bony abnormality of thoracic spine as she has a history of scoliosis and wants to ensure that the fall did not cause any injury. Patient has been taking over-the-counter pain relievers and using ice application with minimal relief of symptoms.  Denies any shortness of breath.  Denies loss of consciousness or hitting her head.   Fall   Past Medical History:  Diagnosis Date   Abnormal Pap smear of vagina 07/28/2016   LGSIL; colpo 07/2016 atrophic squamous cells; colpo 07/2017 - atypia   Allergy    Breast cancer ([place]) 2018   Metastatic Left breast   Elevated hemoglobin A1c 07/28/2016   level - 6.1   Endometriosis    GERD (gastroesophageal reflux disease)    HSV-2 infection    Iron deficiency anemia    Low vitamin D level 07/28/2016   level 24.7   Personal history of chemotherapy    finished 10/19   Personal history of radiation therapy    finished 03/2018   Thyroid cancer ([place])    Thyroid neoplasm     Patient Active Problem List   Diagnosis Date Noted   Papillary thyroid carcinoma ([place]) 04/07/2020   Post-operative hypothyroidism 10/02/2019   Neoplasm of uncertain behavior of thyroid gland 09/16/2019   Multiple thyroid nodules 09/16/2019   COVID-19 virus infection  02/20/2019   Environmental allergies 01/12/2018   Port-A-Cath in place 11/09/2017   Malignant neoplasm of upper-outer quadrant of left breast in female, estrogen receptor positive ([place]) 08/10/2017    Past Surgical History:  Procedure Laterality Date   ABDOMINAL HYSTERECTOMY     BREAST BIOPSY Left 08/09/2017   BREAST LUMPECTOMY Left 01/22/2018   BREAST LUMPECTOMY WITH RADIOACTIVE SEED AND AXILLARY LYMPH NODE DISSECTION Left 01/22/2018   Procedure: LEFT BREAST LUMPECTOMY WITH RADIOACTIVE SEED AND COMPLETE LEFT AXILLARY LYMPH NODE DISSECTION;  Surgeon: [name], MD;  Location: [facility];  Service: General;  Laterality: Left;   CESAREAN SECTION     COLON SURGERY     COLOSTOMY     COLOSTOMY REVERSAL     FOOT SURGERY Right    done spur   OOPHORECTOMY     PORT-A-CATH REMOVAL Right 08/28/2018   Procedure: REMOVAL PORT-A-CATH;  Surgeon: [name], MD;  Location: [facility];  Service: General;  Laterality: Right;   PORTACATH PLACEMENT N/A 09/06/2017   Procedure: INSERTION PORT-A-CATH;  Surgeon: [name], MD;  Location: [facility];  Service: General;  Laterality: N/A;   SMALL INTESTINE SURGERY     SPINE SURGERY     Injections   THYROIDECTOMY N/A 09/20/2019   Procedure: TOTAL THYROIDECTOMY;  Surgeon: [name], MD;  Location: [facility];  Service: General;  Laterality: N/A;  OB History     Gravida  2   Para  2   Term  0   Preterm  0   AB  0   Living  2      SAB  0   IAB  0   Ectopic  0   Multiple  0   Live Births  0            Home Medications    Prior to Admission medications   Medication Sig Start Date End Date Taking? Authorizing Provider  anastrozole (ARIMIDEX) 1 MG tablet TAKE 1 TABLET BY MOUTH EVERY DAY 07/20/20  Yes [name], [name] Dad, MD  levothyroxine (SYNTHROID) 125 MCG tablet Take 1 tablet (125 mcg total) by mouth daily. 11/28/19  Yes Shamleffer, [name], MD  acetaminophen-codeine (TYLENOL #3) 300-30  MG tablet SMARTSIG:1-2 Tablet(s) By Mouth 2-3 Times Daily PRN 12/04/19   [provider]  Artificial Tear Solution (OPTI-TEARS OP) Place 1 drop into both eyes 3 (three) times daily as needed (for dry/irritated eyes.).    [provider]  Ascorbic Acid (VITAMIN C) 100 MG CHEW Chew 100 mg by mouth daily.     [provider]  BORIC ACID EX Apply topically.     [provider]  fluticasone (FLONASE) 50 MCG/ACT nasal spray SPRAY 2 SPRAYS INTO EACH NOSTRIL EVERY DAY 06/23/20   [name], PA  lidocaine (XYLOCAINE) 5 % ointment Apply 1 application topically 3 (three) times daily. Uses as needed. 01/30/20   Nunzio Cobbs, MD  Multiple Vitamins-Minerals (VITAMIN D3 COMPLETE PO) Take 1 tablet by mouth daily.     [provider]  naproxen sodium (ALEVE) 220 MG tablet Take 220 mg by mouth 2 (two) times daily as needed (FOR PAIN.).     [provider]  Probiotic Product (ALIGN PO) Take by mouth.     [provider]  valACYclovir (VALTREX) 500 MG tablet Take 1 tablet (500 mg total) by mouth 2 (two) times daily. Take for three days as needed. 01/30/20   Nunzio Cobbs, MD  prochlorperazine (COMPAZINE) 10 MG tablet Take 1 tablet (10 mg total) by mouth every 6 (six) hours as needed (Nausea or vomiting). 11/09/17 02/01/18  Gardenia Phlegm, NP    Family History Family History  Problem Relation Age of Onset   Mental illness Mother    Alcoholism Mother    Cirrhosis Mother    Cancer Father    Lung cancer Father     Social History Social History   Tobacco Use   Smoking status: Never   Smokeless tobacco: Never  Vaping Use   Vaping Use: Never used  Substance Use Topics   Alcohol use: Yes    Alcohol/week: 1.0 standard drink    Types: 1 Glasses of wine per week   Drug use: Not Currently    Types: Marijuana    Comment: everyday     Allergies   Bee venom, Tramadol, and Penicillins   Review of  Systems Review of Systems Per HPI  Physical Exam Triage Vital Signs ED Triage Vitals  Enc Vitals Group     BP 09/10/20 1452 135/67     Pulse Rate 09/10/20 1452 66     Resp 09/10/20 1452 18     Temp 09/10/20 1452 98.8 F (37.1 C)     Temp src --      SpO2 09/10/20 1452 96 %     Weight --  Height --      Head Circumference --      Peak Flow --      Pain Score 09/10/20 1453 8     Pain Loc --      Pain Edu? --      Excl. in [place]? --    No data found.  Updated Vital Signs BP 135/67   Pulse 66   Temp 98.8 F (37.1 C)   Resp 18   LMP  (LMP Unknown)   SpO2 96%   Visual Acuity Right Eye Distance:   Left Eye Distance:   Bilateral Distance:    Right Eye Near:   Left Eye Near:    Bilateral Near:     Physical Exam Constitutional:      General: She is not in acute distress.    Appearance: Normal appearance.  HENT:     Head: Normocephalic and atraumatic.  Eyes:     Extraocular Movements: Extraocular movements intact.     Conjunctiva/sclera: Conjunctivae normal.  Cardiovascular:     Rate and Rhythm: Normal rate and regular rhythm.     Pulses: Normal pulses.     Heart sounds: Normal heart sounds.  Pulmonary:     Effort: Pulmonary effort is normal. No respiratory distress.     Breath sounds: Normal breath sounds. No stridor. No wheezing, rhonchi or rales.  Chest:     Chest wall: No tenderness.  Musculoskeletal:     Cervical back: Normal. No swelling, edema, erythema, signs of trauma, tenderness or bony tenderness.     Lumbar back: Normal.     Comments: Tenderness to palpation to right trapezius muscle and generalized tenderness to palpation to right shoulder.  Tenderness to palpation to right ribs at approximately ribs 7 through 10.  Skin:    General: Skin is warm and dry.  Neurological:     General: No focal deficit present.     Mental Status: She is alert and oriented to person, place, and time. Mental status is at baseline.  Psychiatric:        Mood and  Affect: Mood normal.        Behavior: Behavior normal.        Thought Content: Thought content normal.        Judgment: Judgment normal.     UC Treatments / Results  Labs (all labs ordered are listed, but only abnormal results are displayed) Labs Reviewed - No data to display  EKG   Radiology DG Ribs Unilateral W/Chest Right  Result Date: 09/10/2020 CLINICAL DATA:  [place] onto RIGHT side on Tuesday, having RIGHT rib, shoulder and back pain EXAM: RIGHT RIBS AND CHEST - 3+ VIEW COMPARISON:  Chest radiographs 09/10/2019 FINDINGS: Normal heart size, mediastinal contours, and pulmonary vascularity. Bronchitic changes with linear subsegmental atelectasis versus scarring in LEFT lower lobe. Remaining lungs clear. No acute infiltrate, pleural effusion, or pneumothorax. Bones appear demineralized. Probable nondisplaced fracture of the anterior RIGHT seventh rib. IMPRESSION: Probable RIGHT anterior seventh rib fracture. Bronchitic changes with atelectasis versus scarring LEFT lower lobe. Electronically Signed   By: Lavonia Dana M.D.   On: 09/10/2020 16:38   DG Thoracic Spine 2 View  Result Date: 09/10/2020 CLINICAL DATA:  [place] 2 days ago.  Back pain. EXAM: THORACIC SPINE 2 VIEWS COMPARISON:  Chest x-ray 09/10/2019 FINDINGS: Mild left convex thoracic scoliosis is stable. Normal alignment on the lateral film. Disc spaces and vertebral bodies are maintained. No findings for acute fracture or destructive bony changes. No  abnormal paraspinal soft tissue swelling is identified. The posterior ribs are intact. The visualized lungs are grossly clear. IMPRESSION: 1. Mild left convex thoracic scoliosis. 2. No acute bony findings. Electronically Signed   By: Marijo Sanes M.D.   On: 09/10/2020 16:39   DG Shoulder Right  Result Date: 09/10/2020 CLINICAL DATA:  [place].  Right shoulder pain. EXAM: RIGHT SHOULDER - 2+ VIEW COMPARISON:  None. FINDINGS: The joint spaces are maintained. No acute bony findings or bone  lesion. No abnormal soft tissue calcifications. The visualized lung is clear and the visualized ribs are intact. IMPRESSION: No acute bony findings. Electronically Signed   By: Marijo Sanes M.D.   On: 09/10/2020 16:40    Procedures Procedures (including critical care time)  Medications Ordered in UC Medications - No data to display  Initial Impression / Assessment and Plan / UC Course  I have reviewed the triage vital signs and the nursing notes.  Pertinent labs & imaging results that were available during my care of the patient were reviewed by me and considered in my medical decision making (see chart for details).     X-ray showing right seventh rib fracture.  Suspect fracture will resolve in the next 1 to 3 months.  Patient to use over-the-counter anti-inflammatories and ice application for treatment of pain and inflammation.  Patient already has established orthopedic and was advised to follow-up with them if pain does not resolve in 1 to 3 months.  Was giving incentive spirometer and educated on how to use it.  Deep breathing exercises were explained to patient in order to prevent pneumonia associated with rib fractures.  Patient to go to the hospital if any shortness of breath develops.  X-ray also showing atelectasis or lung scarring.  This is different from chest x-ray after COVID 19 diagnosis approximately 1 year ago.  Suspect these findings are related to effects of COVID-19 on lungs.  Patient does not have any acute shortness of breath and does not appear to need any immediate medical intervention at this time.  Patient to follow-up with PCP for these findings.  Right shoulder pain most consistent with a right trapezius muscle strain.  Patient to alternate ice and heat application and to use over-the-counter anti-inflammatories.  Patient also to follow-up with orthopedist if this pain does not resolve. Discussed strict return precautions. Patient verbalized understanding and is  agreeable with plan.   Discharged with orthopedic and PCP follow-up. Final Clinical Impressions(s) / UC Diagnoses   Final diagnoses:  Closed fracture of one rib of right side, initial encounter  Strain of right trapezius muscle, initial encounter  Fall, initial encounter     Discharge Instructions      You have a right rib fracture.  Rib fractures typically heal in 1 to 3 months.  Please follow-up with [facility] if you are experiencing complications or pain after this time.    You also appear to have a right trapezius muscle strain.  You may take over-the-counter ibuprofen to decrease inflammation and help with the pain for both the right trapezius muscle strain and right rib fracture.  Please alternate ice and heat application to right upper back and ice application to right rib.     ED Prescriptions   None    PDMP not reviewed this encounter.   Odis Luster, FNP 09/10/20 [place], [place], [place] 09/10/20 1837

## 2020-09-10 NOTE — ED Triage Notes (Signed)
Pt reports a fall on Tuesday . Pt reports she fell onto Rt side and now has pain to Rt shoulder and Rt back

## 2020-09-13 ENCOUNTER — Telehealth (HOSPITAL_COMMUNITY): Payer: Self-pay | Admitting: Internal Medicine

## 2020-09-13 NOTE — Telephone Encounter (Signed)
Called patient to discuss results of rib x-ray that showed "bronchitic changes with linear subsegmental atelectasis versus scarring in LEFT lower lobe". Patient already aware of rib fracture and this was discussed at visit. Suspect Covid related scarring due to normal x-ray approximately 1 year ago when patient had covid 19. Patient was advised to follow up with her PCP for further evaluation and management. Patient voiced understanding. All questions answered.

## 2020-09-15 DIAGNOSIS — Z85850 Personal history of malignant neoplasm of thyroid: Secondary | ICD-10-CM | POA: Insufficient documentation

## 2020-09-22 ENCOUNTER — Encounter: Payer: Self-pay | Admitting: Family Medicine

## 2020-09-22 ENCOUNTER — Telehealth (INDEPENDENT_AMBULATORY_CARE_PROVIDER_SITE_OTHER): Payer: Medicare Other | Admitting: Family Medicine

## 2020-09-22 VITALS — Temp 98.3°F

## 2020-09-22 DIAGNOSIS — U071 COVID-19: Secondary | ICD-10-CM

## 2020-09-22 MED ORDER — MOLNUPIRAVIR EUA 200MG CAPSULE: 4.0000 | ORAL_CAPSULE | Freq: Two times a day (BID) | ORAL | 0 refills | Status: AC

## 2020-09-22 MED ORDER — BENZONATATE 100 MG PO CAPS: 100.0000 mg | ORAL_CAPSULE | Freq: Three times a day (TID) | ORAL | 0 refills | Status: DC | PRN

## 2020-09-22 NOTE — Progress Notes (Signed)
Virtual Visit via Video Note  I connected with Madison Hopkins  on 09/22/20 at 10:20 AM EDT by a video enabled telemedicine application and verified that I am speaking with the correct person using two identifiers.  Location patient: home, [place] Location provider:work or home office Persons participating in the virtual visit: patient, provider  I discussed the limitations of evaluation and management by telemedicine and the availability of in person appointments. The patient expressed understanding and agreed to proceed.   HPI:  Acute telemedicine visit for Covid19: -Onset: about 2 days ago -Symptoms include: sore throat, slight cough, fever 100.3 yesterday - resolved today -Temp 97.4 5 today -Denies: Cp, SOB, NVD, inability to eat/drink/get out of bed -Has tried: musinex, orange juice, green tea -Pertinent past medical history: see below  -Pertinent medication allergies: -COVID-19 vaccine status: had 2 doses and 1 booster of pfizer -no recent labs in the last 6 months for kidneys and liver  ROS: See pertinent positives and negatives per HPI.  Past Medical History:  Diagnosis Date   Abnormal Pap smear of vagina 07/28/2016   LGSIL; colpo 07/2016 atrophic squamous cells; colpo 07/2017 - atypia   Allergy    Breast cancer ([place]) 2018   Metastatic Left breast   Elevated hemoglobin A1c 07/28/2016   level - 6.1   Endometriosis    GERD (gastroesophageal reflux disease)    HSV-2 infection    Iron deficiency anemia    Low vitamin D level 07/28/2016   level 24.7   Personal history of chemotherapy    finished 10/19   Personal history of radiation therapy    finished 03/2018   Thyroid cancer ([place])    Thyroid neoplasm     Past Surgical History:  Procedure Laterality Date   ABDOMINAL HYSTERECTOMY     BREAST BIOPSY Left 08/09/2017   BREAST LUMPECTOMY Left 01/22/2018   BREAST LUMPECTOMY WITH RADIOACTIVE SEED AND AXILLARY LYMPH NODE DISSECTION Left 01/22/2018   Procedure: LEFT BREAST LUMPECTOMY  WITH RADIOACTIVE SEED AND COMPLETE LEFT AXILLARY LYMPH NODE DISSECTION;  Surgeon: [name], MD;  Location: [facility];  Service: General;  Laterality: Left;   CESAREAN SECTION     COLON SURGERY     COLOSTOMY     COLOSTOMY REVERSAL     FOOT SURGERY Right    done spur   OOPHORECTOMY     PORT-A-CATH REMOVAL Right 08/28/2018   Procedure: REMOVAL PORT-A-CATH;  Surgeon: [name], MD;  Location: [facility];  Service: General;  Laterality: Right;   PORTACATH PLACEMENT N/A 09/06/2017   Procedure: INSERTION PORT-A-CATH;  Surgeon: [name], MD;  Location: [facility];  Service: General;  Laterality: N/A;   SMALL INTESTINE SURGERY     SPINE SURGERY     Injections   THYROIDECTOMY N/A 09/20/2019   Procedure: TOTAL THYROIDECTOMY;  Surgeon: [name], MD;  [facility];  Service: General;  Laterality: N/A;     Current Outpatient Medications:    acetaminophen-codeine (TYLENOL #3) 300-30 MG tablet, SMARTSIG:1-2 Tablet(s) By Mouth 2-3 Times Daily PRN, Disp: , Rfl:    anastrozole (ARIMIDEX) 1 MG tablet, TAKE 1 TABLET BY MOUTH EVERY DAY, Disp: 30 tablet, Rfl: 14   Artificial Tear Solution (OPTI-TEARS OP), Place 1 drop into both eyes 3 (three) times daily as needed (for dry/irritated eyes.)., Disp: , Rfl:    Ascorbic Acid (VITAMIN C) 100 MG CHEW, Chew 100 mg by mouth daily. , Disp: , Rfl:    benzonatate (TESSALON PERLES) 100 MG capsule, Take  1 capsule (100 mg total) by mouth 3 (three) times daily as needed., Disp: 20 capsule, Rfl: 0   BORIC ACID EX, Apply topically. , Disp: , Rfl:    fluticasone (FLONASE) 50 MCG/ACT nasal spray, SPRAY 2 SPRAYS INTO EACH NOSTRIL EVERY DAY, Disp: 16 mL, Rfl: 2   levothyroxine (SYNTHROID) 125 MCG tablet, Take 1 tablet (125 mcg total) by mouth daily., Disp: 90 tablet, Rfl: 3   lidocaine (XYLOCAINE) 5 % ointment, Apply 1 application topically 3 (three) times daily. Uses as needed., Disp: 1.25 g, Rfl: 1   molnupiravir EUA 200 mg CAPS,  Take 4 capsules (800 mg total) by mouth 2 (two) times daily for 5 days., Disp: 40 capsule, Rfl: 0   Multiple Vitamins-Minerals (VITAMIN D3 COMPLETE PO), Take 1 tablet by mouth daily. , Disp: , Rfl:    naproxen sodium (ALEVE) 220 MG tablet, Take 220 mg by mouth 2 (two) times daily as needed (FOR PAIN.). , Disp: , Rfl:    Probiotic Product (ALIGN PO), Take by mouth. , Disp: , Rfl:    valACYclovir (VALTREX) 500 MG tablet, Take 1 tablet (500 mg total) by mouth 2 (two) times daily. Take for three days as needed., Disp: 30 tablet, Rfl: 1  EXAM:  VITALS per patient if applicable:  GENERAL: alert, oriented, appears well and in no acute distress  HEENT: atraumatic, conjunttiva clear, no obvious abnormalities on inspection of external nose and ears  NECK: normal movements of the head and neck  LUNGS: on inspection no signs of respiratory distress, breathing rate appears normal, no obvious gross SOB, gasping or wheezing  CV: no obvious cyanosis  MS: moves all visible extremities without noticeable abnormality  PSYCH/NEURO: pleasant and cooperative, no obvious depression or anxiety, speech and thought processing grossly intact  ASSESSMENT AND PLAN:  Discussed the following assessment and plan:  COVID-19   Discussed treatment options, ideal treatment window, potential complications, isolation and precautions for COVID-19.  After lengthy discussion, the patient opted for treatment with molnupiravir due to being higher risk for complications of covid or severe disease and other factors. Discussed EUA status of this drug and the fact that there is preliminary limited knowledge of risks/interactions/side effects per EUA document vs possible benefits and precautions. This information was shared with patient during the visit and also was provided in patient instructions. Also, advised that patient discuss risks/interactions and use with pharmacist/treatment team as well. The patient did want a  prescription for cough, Tessalon Rx sent.  Other symptomatic care measures summarized in patient instructions. Work/School slipped offered: declined Scheduled follow up with PCP offered: she opted to follow up as needed Advised to seek prompt in person care if worsening, new symptoms arise, or if is not improving with treatment. Discussed options for inperson care if PCP office not available. Did let this patient know that I only do telemedicine on Tuesdays and Thursdays for [place]. Advised to schedule follow up visit with PCP or [facility] if any further questions or concerns to avoid delays in care.   I discussed the assessment and treatment plan with the patient. The patient was provided an opportunity to ask questions and all were answered. The patient agreed with the plan and demonstrated an understanding of the instructions.     Lucretia Kern, DO

## 2020-09-22 NOTE — Patient Instructions (Addendum)
HOME CARE TIPS:  -I sent the medication(s) we discussed to your pharmacy: Meds ordered this encounter  Medications   molnupiravir EUA 200 mg CAPS    Sig: Take 4 capsules (800 mg total) by mouth 2 (two) times daily for 5 days.    Dispense:  40 capsule    Refill:  0   benzonatate (TESSALON PERLES) 100 MG capsule    Sig: Take 1 capsule (100 mg total) by mouth 3 (three) times daily as needed.    Dispense:  20 capsule    Refill:  0     -I sent in the [place] treatment or referral you requested per our discussion. Please see the information provided below and discuss further with the pharmacist/treatment team.   -can use tylenol if needed for fevers, aches and pains per instructions  -can use nasal saline a few times per day if you have nasal congestion  -stay hydrated, drink plenty of fluids and eat small healthy meals - avoid dairy  -can take 1000 IU (52mg) Vit D3 and 100-500 mg of Vit C daily per instructions  -If the Covid test is positive, [facility] out [facility] for more information on home care, transmission and treatment for COVID19  -follow up with your doctor in 2-3 days unless improving and feeling better  -stay home while sick, except to seek medical care. If you have COVID19, ideally it would be best to stay home for a full 10 days since the onset of symptoms PLUS one day of no fever and feeling better. Wear a good mask that fits snugly (such as N95 or KN95) if around others to reduce the risk of transmission.  It was nice to meet you today, and I really hope you are feeling better soon. I help [facility] out with telemedicine visits on Tuesdays and Thursdays and am available for visits on those days. If you have any concerns or questions following this visit please schedule a follow up visit with your Primary Care doctor or seek care at a local urgent care clinic to avoid delays in care.    Seek in person care or schedule a follow up video visit promptly if your  symptoms worsen, new concerns arise or you are not improving with treatment. Call 911 and/or seek emergency care if your symptoms are severe or life threatening.    Fact Sheet for Patients And Caregivers Emergency Use Authorization (EUA) Of LAGEVRIOT (molnupiravir) capsules For Coronavirus Disease 2019 (COVID-19)  What is the most important information I should know about LAGEVRIO? LAGEVRIO may cause serious side effects, including: ? LAGEVRIO may cause harm to your unborn baby. It is not known if LAGEVRIO will harm your baby if you take LAGEVRIO during pregnancy. o LAGEVRIO is not recommended for use in pregnancy. o LAGEVRIO has not been studied in pregnancy. LAGEVRIO was studied in pregnant animals only. When LAGEVRIO was given to pregnant animals, LAGEVRIO caused harm to their unborn babies. o You and your healthcare provider may decide that you should take LAGEVRIO during pregnancy if there are no other COVID-19 treatment options approved or authorized by the FDA that are accessible or clinically appropriate for you. o If you and your healthcare provider decide that you should take LAGEVRIO during pregnancy, you and your healthcare provider should discuss the known and potential benefits and the potential risks of taking LAGEVRIO during pregnancy. For individuals who are able to become pregnant: ? You should use a reliable method of birth control (contraception) consistently and  correctly during treatment with LAGEVRIO and for 4 days after the last dose of LAGEVRIO. Talk to your healthcare provider about reliable birth control methods. ? Before starting treatment with [facility] your healthcare provider may do a pregnancy test to see if you are pregnant before starting treatment with LAGEVRIO. ? Tell your healthcare provider right away if you become pregnant or think you may be pregnant during treatment with LAGEVRIO. Pregnancy Surveillance Program: ? There is a pregnancy surveillance  program for individuals who take LAGEVRIO during pregnancy. The purpose of this program is to collect information about the health of you and your baby. Talk to your healthcare provider about how to take part in this program. ? If you take LAGEVRIO during pregnancy and you agree to participate in the pregnancy surveillance program and allow your healthcare provider to share your information with [place], then your healthcare provider will report your use of [place] during pregnancy to [place]. by calling [phone] or PeacefulBlog.es. For individuals who are sexually active with partners who are able to become pregnant: ? It is not known if LAGEVRIO can affect sperm. While the risk is regarded as low, animal studies to fully assess the potential for LAGEVRIO to affect the babies of males treated with LAGEVRIO have not been completed. A reliable method of birth control (contraception) should be used consistently and correctly during treatment with LAGEVRIO and for at least 3 months after the last dose. The risk to sperm beyond 3 months is not known. Studies to understand the risk to sperm beyond 3 months are ongoing. Talk to your healthcare provider about reliable birth control methods. Talk to your healthcare provider if you have questions or concerns about how LAGEVRIO may affect sperm. You are being given this fact sheet because your healthcare provider believes it is necessary to provide you with LAGEVRIO for the treatment of adults with mild-to-moderate coronavirus disease 2019 (COVID-19) with positive results of direct SARS-CoV-2 viral testing, and who are at high risk for progression to severe COVID-19 including hospitalization or death, and for whom other COVID-19 treatment options approved or authorized by the FDA are not accessible or clinically appropriate. The U.S. Food and Drug Administration (FDA) has issued an Emergency Use  Authorization (EUA) to make LAGEVRIO available during the COVID-19 pandemic (for more details about an EUA please see "What is an Emergency Use Authorization?" at the end of this document). LAGEVRIO is not an FDA-approved medicine in the [place]. Read this Fact Sheet for information about LAGEVRIO. Talk to your healthcare provider about your options if you have any questions. It is your choice to take LAGEVRIO.  What is COVID-19? COVID-19 is caused by a virus called a coronavirus. You can get COVID-19 through close contact with another person who has the virus. COVID-19 illnesses have ranged from very mild-to-severe, including illness resulting in death. While information so far suggests that most COVID-19 illness is mild, serious illness can happen and may cause some of your other medical conditions to become worse. Older people and people of all ages with severe, long lasting (chronic) medical conditions like heart disease, lung disease and diabetes, for example seem to be at higher risk of being hospitalized for COVID-19.  What is LAGEVRIO? LAGEVRIO is an investigational medicine used to treat mild-to-moderate COVID-19 in adults: ? with positive results of direct SARS-CoV-2 viral testing, and ? who are at high risk for progression to severe COVID-19 including hospitalization or death, and for whom  other COVID-19 treatment options approved or authorized by the FDA are not accessible or clinically appropriate. The FDA has authorized the emergency use of LAGEVRIO for the treatment of mild-tomoderate COVID-19 in adults under an EUA. For more information on EUA, see the "What is an Emergency Use Authorization (EUA)?" section at the end of this Fact Sheet. LAGEVRIO is not authorized: ? for use in people less than 94 years of age. ? for prevention of COVID-19. ? for people needing hospitalization for COVID-19. ? for use for longer than 5 consecutive days.  What should I tell my  healthcare provider before I take LAGEVRIO? Tell your healthcare provider if you: ? Have any allergies ? Are breastfeeding or plan to breastfeed ? Have any serious illnesses ? Are taking any medicines (prescription, over-the-counter, vitamins, or herbal products).  How do I take LAGEVRIO? ? Take LAGEVRIO exactly as your healthcare provider tells you to take it. ? Take 4 capsules of LAGEVRIO every 12 hours (for example, at 8 am and at 8 pm) ? Take LAGEVRIO for 5 days. It is important that you complete the full 5 days of treatment with LAGEVRIO. Do not stop taking LAGEVRIO before you complete the full 5 days of treatment, even if you feel better. ? Take LAGEVRIO with or without food. ? You should stay in isolation for as long as your healthcare provider tells you to. Talk to your healthcare provider if you are not sure about how to properly isolate while you have COVID-19. ? Swallow LAGEVRIO capsules whole. Do not open, break, or crush the capsules. If you cannot swallow capsules whole, tell your healthcare provider. ? What to do if you miss a dose: o If it has been less than 10 hours since the missed dose, take it as soon as you remember o If it has been more than 10 hours since the missed dose, skip the missed dose and take your dose at the next scheduled time. ? Do not double the dose of LAGEVRIO to make up for a missed dose.  What are the important possible side effects of LAGEVRIO? ? See, "What is the most important information I should know about LAGEVRIO?" ? Allergic Reactions. Allergic reactions can happen in people taking LAGEVRIO, even after only 1 dose. Stop taking LAGEVRIO and call your healthcare provider right away if you get any of the following symptoms of an allergic reaction: o hives o rapid heartbeat o trouble swallowing or breathing o swelling of the mouth, lips, or face o throat tightness o hoarseness o skin rash The most common side effects of LAGEVRIO  are: ? diarrhea ? nausea ? dizziness These are not all the possible side effects of LAGEVRIO. Not many people have taken LAGEVRIO. Serious and unexpected side effects may happen. This medicine is still being studied, so it is possible that all of the risks are not known at this time.  What other treatment choices are there?  Veklury (remdesivir) is FDA-approved as an intravenous (IV) infusion for the treatment of mildto-moderate EXHBZ-16 in certain adults and children. Talk with your doctor to see if [place] is appropriate for you. Like LAGEVRIO, FDA may also allow for the emergency use of other medicines to treat people with COVID-19. Go to LacrosseProperties.si for more information. It is your choice to be treated or not to be treated with LAGEVRIO. Should you decide not to take it, it will not change your standard medical care.  What if I am breastfeeding? Breastfeeding is not recommended  during treatment with LAGEVRIO and for 4 days after the last dose of LAGEVRIO. If you are breastfeeding or plan to breastfeed, talk to your healthcare provider about your options and specific situation before taking LAGEVRIO.  How do I report side effects with LAGEVRIO? Contact your healthcare provider if you have any side effects that bother you or do not go away. Report side effects to FDA MedWatch at SmoothHits.hu or call 1-800-FDA-1088 (1- 780 634 2559).  How should I store [place]? ? Store LAGEVRIO capsules at room temperature between 19F to 20F (20C to 25C). ? Keep LAGEVRIO and all medicines out of the reach of children and pets. How can I learn more about COVID-19? ? Ask your healthcare provider. ? Visit SeekRooms.co.uk ? Contact your local or state public health department. ? Call [place] at [phone] (toll free in the [place] ? Visit  www.molnupiravir.com  What Is an Emergency Use Authorization (EUA)? The [place] FDA has made [place] available under an emergency access mechanism called an Emergency Use Authorization (EUA) The EUA is supported by a Presenter, broadcasting Health and Human Service (HHS) declaration that circumstances exist to justify emergency use of drugs and biological products during the COVID-19 pandemic. LAGEVRIO for the treatment of mild-to-moderate COVID-19 in adults with positive results of direct SARS-CoV-2 viral testing, who are at high risk for progression to severe COVID-19, including hospitalization or death, and for whom alternative COVID-19 treatment options approved or authorized by FDA are not accessible or clinically appropriate, has not undergone the same type of review as an FDA-approved product. In issuing an EUA under the VGCYO-82 public health emergency, the FDA has determined, among other things, that based on the total amount of scientific evidence available including data from adequate and well-controlled clinical trials, if available, it is reasonable to believe that the product may be effective for diagnosing, treating, or preventing COVID-19, or a serious or life-threatening disease or condition caused by COVID-19; that the known and potential benefits of the product, when used to diagnose, treat, or prevent such disease or condition, outweigh the known and potential risks of such product; and that there are no adequate, approved, and available alternatives.  All of these criteria must be met to allow for the product to be used in the treatment of patients during the COVID-19 pandemic. The EUA for LAGEVRIO is in effect for the duration of the COVID-19 declaration justifying emergency use of LAGEVRIO, unless terminated or revoked (after which LAGEVRIO may no longer be used under the EUA). For patent information: http://rogers.info/ Copyright  2021-2022 [place]  [place], [place] and its affiliates. All rights reserved. [id number] Revised: March 2022

## 2020-10-02 ENCOUNTER — Other Ambulatory Visit: Payer: Self-pay | Admitting: Family Medicine

## 2020-10-02 DIAGNOSIS — M67911 Unspecified disorder of synovium and tendon, right shoulder: Secondary | ICD-10-CM

## 2020-10-02 DIAGNOSIS — S2249XG Multiple fractures of ribs, unspecified side, subsequent encounter for fracture with delayed healing: Secondary | ICD-10-CM

## 2020-10-11 ENCOUNTER — Ambulatory Visit
Admission: RE | Admit: 2020-10-11 | Discharge: 2020-10-11 | Disposition: A | Discharge status: Home / Self Care | Payer: Medicare Other | Source: Ambulatory Visit | Attending: Family Medicine | Admitting: Family Medicine

## 2020-10-11 DIAGNOSIS — M67911 Unspecified disorder of synovium and tendon, right shoulder: Secondary | ICD-10-CM

## 2020-10-13 ENCOUNTER — Other Ambulatory Visit: Payer: Self-pay

## 2020-10-13 ENCOUNTER — Other Ambulatory Visit: Payer: Medicare Other

## 2020-10-13 ENCOUNTER — Ambulatory Visit
Admission: RE | Admit: 2020-10-13 | Discharge: 2020-10-13 | Disposition: A | Discharge status: Home / Self Care | Payer: Medicare Other | Source: Ambulatory Visit | Attending: Family Medicine | Admitting: Family Medicine

## 2020-10-13 ENCOUNTER — Ambulatory Visit (INDEPENDENT_AMBULATORY_CARE_PROVIDER_SITE_OTHER): Payer: Medicare Other | Admitting: Internal Medicine

## 2020-10-13 VITALS — BP 138/82 | HR 70 | Ht 68.0 in | Wt 199.0 lb

## 2020-10-13 DIAGNOSIS — E890 Postprocedural hypothyroidism: Secondary | ICD-10-CM | POA: Diagnosis not present

## 2020-10-13 DIAGNOSIS — C73 Malignant neoplasm of thyroid gland: Secondary | ICD-10-CM | POA: Diagnosis not present

## 2020-10-13 DIAGNOSIS — S2249XG Multiple fractures of ribs, unspecified side, subsequent encounter for fracture with delayed healing: Secondary | ICD-10-CM

## 2020-10-13 NOTE — Patient Instructions (Signed)

## 2020-10-13 NOTE — Progress Notes (Signed)
Name: Madison Hopkins  MRN/ DOB: [id number], 01-11-1956    Age/ Sex: 65 y.o., female     PCP: [name], PA   Reason for Endocrinology Evaluation: [facility]      Initial Endocrinology Clinic Visit: 10/02/2019    PATIENT IDENTIFIER: Madison Hopkins is a 65 y.o., female with a past medical history of Breast Ca and PTC . She has followed with [facility] since 10/02/2019 for consultative assistance with management of her PTC.   HISTORICAL SUMMARY:   Pt has been referred by Dr. [name].  She had an incidental finding of a left thyroid nodule ~1.5 cm in diameter on neck MRI during evaluation of chronic neck pain.  This nodule was noted to enlarge to 2.3 cm max diameter on cervical spine MRI 04/2019.   She is S/P FNA in 06/2019 - with left mid pole cytology report of Suspicious for malignancy (Bethesda category V), no afirma found for this in the chart. And a left inferior pole cytology of Atypia of undetermined significance (Bethesda category III) , with benign afirma     She is S/P Total thyroidectomy  On 7/23rd/2021 with a 1.3 cm papillary thyroid carcinoma on the left with no invasion, free margins and no L.N submitted.     SUBJECTIVE:    Today (10/13/2020):  Madison Hopkins is here for total thyroidectomy secondary to PTC.    Weight has been stable  Denies diarrhea  Denies tremors  Denies palpitations  Denies local  neck swelling   Levothyroxine 125 mcg , half a tablet on sundays and 1 tablet daily the rest of the week - taking 1 tablet daily     She fell at [facility] in 08/2020. S/P rib # Continues with rib pain       HISTORY:  Past Medical History:  Past Medical History:  Diagnosis Date   Abnormal Pap smear of vagina 07/28/2016   LGSIL; colpo 07/2016 atrophic squamous cells; colpo 07/2017 - atypia   Allergy    Breast cancer ([place]) 2018   Metastatic Left breast   Elevated hemoglobin A1c 07/28/2016   level - 6.1   Endometriosis    GERD  (gastroesophageal reflux disease)    HSV-2 infection    Iron deficiency anemia    Low vitamin D level 07/28/2016   level 24.7   Personal history of chemotherapy    finished 10/19   Personal history of radiation therapy    finished 03/2018   Thyroid cancer ([place])    Thyroid neoplasm    Past Surgical History:  Past Surgical History:  Procedure Laterality Date   ABDOMINAL HYSTERECTOMY     BREAST BIOPSY Left 08/09/2017   BREAST LUMPECTOMY Left 01/22/2018   BREAST LUMPECTOMY WITH RADIOACTIVE SEED AND AXILLARY LYMPH NODE DISSECTION Left 01/22/2018   Procedure: LEFT BREAST LUMPECTOMY WITH RADIOACTIVE SEED AND COMPLETE LEFT AXILLARY LYMPH NODE DISSECTION;  Surgeon: [name], MD;  Location: [facility];  Service: General;  Laterality: Left;   CESAREAN SECTION     COLON SURGERY     COLOSTOMY     COLOSTOMY REVERSAL     FOOT SURGERY Right    done spur   OOPHORECTOMY     PORT-A-CATH REMOVAL Right 08/28/2018   Procedure: REMOVAL PORT-A-CATH;  Surgeon: [name], MD;  Location: [facility];  Service: General;  Laterality: Right;   PORTACATH PLACEMENT N/A 09/06/2017   Procedure: INSERTION PORT-A-CATH;  Surgeon: [name], MD;  Location: [facility];  Service: General;  Laterality: N/A;   SMALL INTESTINE SURGERY     SPINE SURGERY     Injections   THYROIDECTOMY N/A 09/20/2019   Procedure: TOTAL THYROIDECTOMY;  Surgeon: [name], MD;  Location: [facility];  Service: General;  Laterality: N/A;   Social History:  reports that she has never smoked. She has never used smokeless tobacco. She reports current alcohol use of about 1.0 standard drink per week. She reports that she does not currently use drugs after having used the following drugs: Marijuana. Family History:  Family History  Problem Relation Age of Onset   Mental illness Mother    Alcoholism Mother    Cirrhosis Mother    Cancer Father    Lung cancer Father      HOME MEDICATIONS: Allergies as of  10/13/2020       Reactions   Bee Venom    Tramadol Nausea Only   Penicillins Nausea Only   Has patient had a PCN reaction causing immediate rash, facial/tongue/throat swelling, SOB or lightheadedness with hypotension: No Has patient had a PCN reaction causing severe rash involving mucus membranes or skin necrosis: No Has patient had a PCN reaction that required hospitalization: No Has patient had a PCN reaction occurring within the last 10 years: Yes--nausea & headache ONLY If all of the above answers are "NO", then may proceed with Cephalosporin use.        Medication List        Accurate as of October 13, 2020 11:27 AM. If you have any questions, ask your nurse or doctor.          acetaminophen-codeine 300-30 MG tablet Commonly known as: TYLENOL #3 SMARTSIG:1-2 Tablet(s) By Mouth 2-3 Times Daily PRN   ALIGN PO Take by mouth.   anastrozole 1 MG tablet Commonly known as: ARIMIDEX TAKE 1 TABLET BY MOUTH EVERY DAY   benzonatate 100 MG capsule Commonly known as: Tessalon Perles Take 1 capsule (100 mg total) by mouth 3 (three) times daily as needed.   BORIC ACID EX Apply topically.   fluticasone 50 MCG/ACT nasal spray Commonly known as: FLONASE SPRAY 2 SPRAYS INTO EACH NOSTRIL EVERY DAY   levothyroxine 125 MCG tablet Commonly known as: SYNTHROID Take 1 tablet (125 mcg total) by mouth daily.   lidocaine 5 % ointment Commonly known as: XYLOCAINE Apply 1 application topically 3 (three) times daily. Uses as needed.   naproxen sodium 220 MG tablet Commonly known as: ALEVE Take 220 mg by mouth 2 (two) times daily as needed (FOR PAIN.).   OPTI-TEARS OP Place 1 drop into both eyes 3 (three) times daily as needed (for dry/irritated eyes.).   valACYclovir 500 MG tablet Commonly known as: VALTREX Take 1 tablet (500 mg total) by mouth 2 (two) times daily. Take for three days as needed.   Vitamin C 100 MG Chew Chew 100 mg by mouth daily.   VITAMIN D3 COMPLETE  PO Take 1 tablet by mouth daily.          OBJECTIVE:   PHYSICAL EXAM: VS:BP 138/82   Pulse 70   Ht _0  (1.727 m)   Wt 199 lb (90.3 kg)   LMP  (LMP Unknown)   SpO2 97%   BMI 30.26 kg/m    EXAM: General: Pt appears well and is in NAD  Neck: General: Supple without adenopathy. Thyroid: Surgically removed . No nodules appreciated.  Lungs: Clear with good BS bilat with no rales, rhonchi, or wheezes  Heart: Auscultation: RRR.  Abdomen: Normoactive bowel sounds, soft, nontender, without masses or organomegaly palpable  Extremities:  BL LE: No pretibial edema normal ROM and strength.  Mental Status: Judgment, insight: Intact Orientation: Oriented to time, place, and person Mood and affect: No depression, anxiety, or agitation     DATA REVIEWED: Results for LETTICIA, BHATTACHARYYA (MRN [id number]) as of 10/14/2020 13:04  Ref. Range 10/13/2020 11:49  TSH Latest Ref Range: 0.40 - 4.50 mIU/L 0.02 (L)  Thyroglobulin Latest Units: ng/mL <0.1 (L)  Thyroglobulin Ab Latest Ref Range: < or = 1 IU/mL <1     Pathology report 09/20/2019 THYROID, TOTAL THYROIDECTOMY:  - Papillary thyroid carcinoma, 1.3 cm.  - Margins not involved.  - Multiple adenomatous nodules.  - See oncology table.   ONCOLOGY TABLE:  THYROID GLAND:  Procedure: Total thyroidectomy.  Tumor Focality: Unifocal.  Tumor Site: Left lobe, mid.  Tumor Size: 1.3 x 1 x 1 cm.  Histologic Type: Papillary thyroid carcinoma.  Margins: Free of tumor.  Angioinvasion: Not identified.  Lymphatic Invasion: Not identified.  Extrathyroidal extension: Not identified.  Regional Lymph Nodes: No lymph nodes submitted.  Pathologic Stage Classification (pTNM, AJCC 8th Edition): pT1b, pNX   Thyroid Ultrasound 04/13/2020  FINDINGS: Thyroid tissue has been surgically removed. No suspicious soft tissue or nodularity at the surgical bed. No enlarged lymph nodes.   IMPRESSION: Expected changes from a total thyroidectomy. No evidence  for residual thyroid tissue.   ASSESSMENT / PLAN / RECOMMENDATIONS:   Hx of papillary Thyroid carcinoma (pT1b, pNX) Stage I:     - S/P total thyroidectomy 09/20/2019 - She is at low risk for recurrent based on pathology report  - No indication for RAI ablation at this time - TSH goal 0.1-0.5 uIU/mL for now  - Will order a thyroid bed ultrasound  - Pt with excellent biochemical response Tg, Tg Ab undetectable      2. Post-operative Hypothyroidism      - She is clinically euthyroid  - Pt educated extensively on the correct way to take levothyroxine (first thing in the morning with water, 30 minutes before eating or taking other medications). - Pt encouraged to double dose the following day if she were to miss a dose given long half-life of levothyroxine.  -TSH is below goal unfortunately she did not get my message to reduce her levothyroxine, we will continue with the same recommendations of reducing levothyroxine as below     Medications   -  Decrease Levothyroxine 125 mcg to half a tablet on Sundays and 1 tablet rest of the week      F/U in 6 months    Signed electronically by: Mack Guise, MD  [facility] End[facility]  [address] [place], [place] [address] Phone: [phone] FAX: [phone]      CC: [name], [address] [place] [address] Phone: [phone]  Fax: [phone]   Return to Endocrinology clinic as below: Future Appointments  Date Time Provider [place]  11/26/2020 10:00 AM Nunzio Cobbs, MD [facility] None  12/10/2020 12:30 PM [facility] None  12/10/2020  1:00 PM [name], [name] Dad, MD [facility] None

## 2020-10-14 ENCOUNTER — Encounter: Payer: Self-pay | Admitting: Internal Medicine

## 2020-10-14 LAB — THYROGLOBULIN LEVEL: Thyroglobulin: <0.1 ng/mL — ABNORMAL LOW

## 2020-10-14 LAB — THYROGLOBULIN ANTIBODY: Thyroglobulin Ab: <1 IU/mL (ref <=1)

## 2020-10-14 LAB — TSH: TSH: 0.02 mIU/L — ABNORMAL LOW (ref 0.40–4.50)

## 2020-10-14 MED ORDER — LEVOTHYROXINE SODIUM 125 MCG PO TABS: 125.0000 ug | ORAL_TABLET | ORAL | 3 refills | Status: DC

## 2020-10-15 ENCOUNTER — Other Ambulatory Visit: Payer: Self-pay | Admitting: Internal Medicine

## 2020-10-20 ENCOUNTER — Ambulatory Visit (HOSPITAL_BASED_OUTPATIENT_CLINIC_OR_DEPARTMENT_OTHER)
Admission: RE | Admit: 2020-10-20 | Discharge: 2020-10-20 | Disposition: A | Discharge status: Home / Self Care | Payer: Medicare Other | Source: Ambulatory Visit | Attending: Internal Medicine | Admitting: Internal Medicine

## 2020-10-20 ENCOUNTER — Other Ambulatory Visit: Payer: Medicare Other

## 2020-10-20 ENCOUNTER — Other Ambulatory Visit: Payer: Self-pay

## 2020-10-20 ENCOUNTER — Encounter: Payer: Self-pay | Admitting: Internal Medicine

## 2020-10-20 DIAGNOSIS — C73 Malignant neoplasm of thyroid gland: Secondary | ICD-10-CM | POA: Diagnosis not present

## 2020-10-26 ENCOUNTER — Encounter: Payer: Self-pay | Admitting: Oncology

## 2020-10-26 NOTE — Progress Notes (Signed)
GYNECOLOGY  VISIT   HPI: 65 y.o.   Single  African American  female   [phone] with No LMP recorded (lmp unknown). Patient has had a hysterectomy.   here for vaginal irritation. Patient had intercourse for first time in 10 years this weekend. She was dry and did see some bleeding after.  Some pain with intercourse.  No lubricant use.   No discharge or odor.   Taking Valtrex daily.  GYNECOLOGIC HISTORY: No LMP recorded (lmp unknown). Patient has had a hysterectomy. Contraception: Hyst Menopausal hormone therapy: none Last mammogram:   11-30-19 Bil.MRI--See Epic Last pap smear: 10-31-19 Neg:Neg HR HPV, 10-23-18 Neg:Neg HR HPV, 08-10-17 ASCUS:Neg HR HPV         OB History     Gravida  2   Para  2   Term  0   Preterm  0   AB  0   Living  2      SAB  0   IAB  0   Ectopic  0   Multiple  0   Live Births  0              Patient Active Problem List   Diagnosis Date Noted   Papillary thyroid carcinoma ([place]) 04/07/2020   Post-operative hypothyroidism 10/02/2019   Neoplasm of uncertain behavior of thyroid gland 09/16/2019   Multiple thyroid nodules 09/16/2019   COVID-19 virus infection 02/20/2019   Environmental allergies 01/12/2018   Port-A-Cath in place 11/09/2017   Malignant neoplasm of upper-outer quadrant of left breast in female, estrogen receptor positive ([place]) 08/10/2017    Past Medical History:  Diagnosis Date   Abnormal Pap smear of vagina 07/28/2016   LGSIL; colpo 07/2016 atrophic squamous cells; colpo 07/2017 - atypia   Allergy    Breast cancer ([place]) 2018   Metastatic Left breast   Elevated hemoglobin A1c 07/28/2016   level - 6.1   Endometriosis    GERD (gastroesophageal reflux disease)    HSV-2 infection    Iron deficiency anemia    Low vitamin D level 07/28/2016   level 24.7   Personal history of chemotherapy    finished 10/19   Personal history of radiation therapy    finished 03/2018   Thyroid cancer ([place])    Thyroid neoplasm     Past  Surgical History:  Procedure Laterality Date   ABDOMINAL HYSTERECTOMY     BREAST BIOPSY Left 08/09/2017   BREAST LUMPECTOMY Left 01/22/2018   BREAST LUMPECTOMY WITH RADIOACTIVE SEED AND AXILLARY LYMPH NODE DISSECTION Left 01/22/2018   Procedure: LEFT BREAST LUMPECTOMY WITH RADIOACTIVE SEED AND COMPLETE LEFT AXILLARY LYMPH NODE DISSECTION;  Surgeon: [name], MD;  Location: [facility];  Service: General;  Laterality: Left;   CESAREAN SECTION     COLON SURGERY     COLOSTOMY     COLOSTOMY REVERSAL     FOOT SURGERY Right    done spur   OOPHORECTOMY     PORT-A-CATH REMOVAL Right 08/28/2018   Procedure: REMOVAL PORT-A-CATH;  Surgeon: [name], MD;  Location: [facility];  Service: General;  Laterality: Right;   PORTACATH PLACEMENT N/A 09/06/2017   Procedure: INSERTION PORT-A-CATH;  Surgeon: [name], MD;  Location: [facility];  Service: General;  Laterality: N/A;   SMALL INTESTINE SURGERY     SPINE SURGERY     Injections   THYROIDECTOMY N/A 09/20/2019   Procedure: TOTAL THYROIDECTOMY;  Surgeon: [name], MD;  Location: [facility];  Service: General;  Laterality: N/A;    Current Outpatient Medications  Medication Sig Dispense Refill   anastrozole (ARIMIDEX) 1 MG tablet TAKE 1 TABLET BY MOUTH EVERY DAY 30 tablet 14   Artificial Tear Solution (OPTI-TEARS OP) Place 1 drop into both eyes 3 (three) times daily as needed (for dry/irritated eyes.).     Ascorbic Acid (VITAMIN C) 100 MG CHEW Chew 100 mg by mouth daily.      BORIC ACID EX Apply topically.      Cholecalciferol 25 MCG (1000 UT) tablet Take by mouth.     fluticasone (FLONASE) 50 MCG/ACT nasal spray SPRAY 2 SPRAYS INTO EACH NOSTRIL EVERY DAY 16 mL 2   ibuprofen (ADVIL) 800 MG tablet Take 800 mg by mouth 3 (three) times daily as needed.     levothyroxine (SYNTHROID) 125 MCG tablet Take 1 tablet (125 mcg total) by mouth as directed. Half a tablet on Sundays, 1 tablet the rest of the week 85 tablet 3    lidocaine (XYLOCAINE) 5 % ointment Apply 1 application topically 3 (three) times daily. Uses as needed. 1.25 g 1   Multiple Vitamins-Minerals (VITAMIN D3 COMPLETE PO) Take 1 tablet by mouth daily.      naproxen sodium (ALEVE) 220 MG tablet Take 220 mg by mouth 2 (two) times daily as needed (FOR PAIN.).      Probiotic Product (ALIGN PO) Take by mouth.      valACYclovir (VALTREX) 500 MG tablet Take 1 tablet (500 mg total) by mouth 2 (two) times daily. Take for three days as needed. 30 tablet 1   No current facility-administered medications for this visit.     ALLERGIES: Penicillins, Bee venom, and Tramadol  Family History  Problem Relation Age of Onset   Mental illness Mother    Alcoholism Mother    Cirrhosis Mother    Cancer Father    Lung cancer Father     Social History   Socioeconomic History   Marital status: Single    Spouse name: Not on file   Number of children: 2   Years of education: Not on file   Highest education level: Not on file  Occupational History   Not on file  Tobacco Use   Smoking status: Never   Smokeless tobacco: Never  Vaping Use   Vaping Use: Never used  Substance and Sexual Activity   Alcohol use: Yes    Alcohol/week: 1.0 standard drink    Types: 1 Glasses of wine per week   Drug use: Not Currently    Types: Marijuana    Comment: everyday   Sexual activity: Not Currently    Birth control/protection: Post-menopausal, Surgical    Comment: Hysterectomy  Other Topics Concern   Not on file  Social History Narrative   Retired from [employer] -- retiring next April   Has children    Social Determinants of Radio broadcast assistant Strain: Not on [employer] Insecurity: Not on file  Transportation Needs: Not on file  Physical Activity: Not on file  Stress: Not on file  Social Connections: Not on file  Intimate Partner Violence: Not on file    Review of Systems  Genitourinary:  Positive for vaginal bleeding.        Vaginal irritation  All other systems reviewed and are negative.  PHYSICAL EXAMINATION:    BP 122/78   Pulse 78   Ht '5\' 8"'$  (1.727 m)   Wt 190 lb (86.2 kg)   LMP  (  LMP Unknown)   SpO2 98%   BMI 28.89 kg/m     General appearance: alert, cooperative and appears stated age   Pelvic: External genitalia:  no lesions              Urethra:  normal appearing urethra with no masses, tenderness or lesions              Bartholins and Skenes: normal                 Vagina: old blood in vaginal vault. Became red with placement of speculum.               Cervix: absent                Bimanual Exam:  Uterus:  absent.              Adnexa: no mass, fullness, tenderness          Chaperone was present for exam:  Raquel Sarna, RN.  ASSESSMENT  Vaginitis.  I suspect atrophy.  Postmenopausal bleeding.  Likely due to atrophy.  Dysuria.  Status post TAH/BSO/appy.  Hx metastatic left breast cancer. On Arimidex.  Hx VAIN I.  Hx HSV II.  Elevated A1C.   PLAN  Vaginitis testing.  STD testing.  We discussed hydration for the vaginal with water based and silicone based lubricants, cooking oils, and vit E vaginal suppositories.  Pelvic rest recommended for one month.  If bleeding stops, ok to use vit E suppositories.  Check urine today.  Start Valtrex daily and increased to bid for 3 days for an outbreak.  Fu in one month for annual exam and recheck.    An After Visit Summary was printed and given to the patient.  38 min  total time was spent for this patient encounter, including preparation, face-to-face counseling with the patient, coordination of care, and documentation of the encounter.

## 2020-10-27 ENCOUNTER — Ambulatory Visit: Payer: Medicare Other | Admitting: Obstetrics and Gynecology

## 2020-10-27 ENCOUNTER — Other Ambulatory Visit: Payer: Self-pay

## 2020-10-27 ENCOUNTER — Encounter: Payer: Self-pay | Admitting: Obstetrics and Gynecology

## 2020-10-27 ENCOUNTER — Other Ambulatory Visit (HOSPITAL_COMMUNITY)
Admission: RE | Admit: 2020-10-27 | Discharge: 2020-10-27 | Disposition: A | Discharge status: Home / Self Care | Payer: Medicare Other | Source: Ambulatory Visit | Attending: Obstetrics and Gynecology | Admitting: Obstetrics and Gynecology

## 2020-10-27 VITALS — BP 122/78 | HR 78 | Ht 68.0 in | Wt 190.0 lb

## 2020-10-27 DIAGNOSIS — N950 Postmenopausal bleeding: Secondary | ICD-10-CM | POA: Diagnosis not present

## 2020-10-27 DIAGNOSIS — Z113 Encounter for screening for infections with a predominantly sexual mode of transmission: Secondary | ICD-10-CM

## 2020-10-27 DIAGNOSIS — N760 Acute vaginitis: Secondary | ICD-10-CM

## 2020-10-27 DIAGNOSIS — N952 Postmenopausal atrophic vaginitis: Secondary | ICD-10-CM | POA: Insufficient documentation

## 2020-10-27 DIAGNOSIS — R300 Dysuria: Secondary | ICD-10-CM | POA: Diagnosis not present

## 2020-10-27 MED ORDER — VALACYCLOVIR HCL 500 MG PO TABS: 500.0000 mg | ORAL_TABLET | Freq: Every day | ORAL | 0 refills | Status: DC

## 2020-10-27 NOTE — Patient Instructions (Signed)
Atrophic Vaginitis Atrophic vaginitis is a condition in which the tissues that line the vagina become dry and thin. This condition is most common in women who have stopped having regular menstrual periods (are in menopause). This usually starts when a woman is 45 to 65 years old. That is the time when a woman's estrogen levels begin to decrease. Estrogen is a female hormone. It helps to keep the tissues of the vagina moist. It stimulates the vagina to produce a clear fluid that lubricates the vagina for sex. This fluid also protects the vagina from infection. Lack of estrogen can cause the lining of the vagina to get thinner and dryer. The vagina may also shrink in size. It may become less elastic. Atrophic vaginitis tends to get worse over time as a woman's estrogen level drops. What are the causes? This condition is caused by the normal drop in estrogen that happens around the time of menopause. What increases the risk? Certain conditions or situations may lower a woman's estrogen level, leading to a higher risk for atrophic vaginitis. You are more likely to develop this condition if: You are taking medicines that block estrogen. You have had your ovaries removed. You are being treated for cancer with radiation or medicines (chemotherapy). You have given birth or are breastfeeding. You are older than age 50. You smoke. What are the signs or symptoms? Symptoms of this condition include: Pain, soreness, a feeling of pressure, or bleeding during sex (dyspareunia). Vaginal burning, irritation, or itching. Pain or bleeding when a speculum is used in a vaginal exam. Having burning pain while urinating. Vaginal discharge. In some cases, there are no symptoms. How is this diagnosed? This condition is diagnosed based on your medical history and a physical exam. This will include a pelvic exam that checks the vaginal tissues. Though rare, you may also have other tests, including: A urine test. A test  that checks the acid balance in your vagina (acid balance test). How is this treated? Treatment for this condition depends on how severe your symptoms are. Treatment may include: Using an over-the-counter vaginal lubricant before sex. Using a long-acting vaginal moisturizer. Using low-dose estrogen for moderate to severe symptoms that do not respond to other treatments. Options include creams, tablets, and inserts (vaginal rings). Before you use a vaginal estrogen, tell your health care provider if you have a history of: Breast cancer. Endometrial cancer. Blood clots. If you are not sexually active and your symptoms are very mild, you may not need treatment. Follow these instructions at home: Medicines Take over-the-counter and prescription medicines only as told by your health care provider. Do not use herbal or alternative medicines unless your health care provider says that you can. Use over-the-counter creams, lubricants, or moisturizers for dryness only as told by your health care provider. General instructions If your atrophic vaginitis is caused by menopause, discuss all of your menopause symptoms and treatment options with your health care provider. Do not douche. Do not use products that can make your vagina dry. These include: Scented feminine sprays. Scented tampons. Scented soaps. Vaginal sex can help to improve blood flow and elasticity of vaginal tissue. If you choose to have sex and it hurts, try using a water-soluble lubricant or moisturizer right before having sex. Contact a health care provider if: Your discharge looks different than normal. Your vagina has an unusual smell. You have new symptoms. Your symptoms do not improve with treatment. Your symptoms get worse. Summary Atrophic vaginitis is a condition in   which the tissues that line the vagina become dry and thin. It is most common in women who have stopped having regular menstrual periods (are in  menopause). Treatment options include using vaginal lubricants and low-dose vaginal estrogen. Contact a health care provider if your vagina has an unusual smell, or if your symptoms get worse or do not improve after treatment. This information is not intended to replace advice given to you by your health care provider. Make sure you discuss any questions you have with your health care provider. Document Revised: 08/15/2019 Document Reviewed: 08/15/2019 Elsevier Patient Education  2022 Elsevier Inc.  

## 2020-10-28 ENCOUNTER — Encounter: Payer: Self-pay | Admitting: Obstetrics and Gynecology

## 2020-10-28 LAB — CERVICOVAGINAL ANCILLARY ONLY
Bacterial Vaginitis (gardnerella): NEGATIVE
Candida Glabrata: NEGATIVE
Candida Vaginitis: NEGATIVE
Chlamydia: NEGATIVE
Comment: NEGATIVE
Comment: NEGATIVE
Comment: NEGATIVE
Comment: NEGATIVE
Comment: NEGATIVE
Comment: NORMAL
Neisseria Gonorrhea: NEGATIVE
Trichomonas: NEGATIVE

## 2020-10-28 LAB — HEPATITIS C ANTIBODY
Hepatitis C Ab: NONREACTIVE
SIGNAL TO CUT-OFF: 0.01 (ref <1.00)

## 2020-10-28 LAB — HIV ANTIBODY (ROUTINE TESTING W REFLEX): HIV 1&2 Ab, 4th Generation: NONREACTIVE

## 2020-10-28 LAB — RPR: RPR Ser Ql: NONREACTIVE

## 2020-10-28 LAB — HEPATITIS B SURFACE ANTIGEN: Hepatitis B Surface Ag: NONREACTIVE

## 2020-10-28 NOTE — Telephone Encounter (Signed)
Patient aware once DR. Silva reads the results. She will tell us her recommendations for treatment if needed

## 2020-10-29 ENCOUNTER — Other Ambulatory Visit: Payer: Self-pay | Admitting: Obstetrics and Gynecology

## 2020-10-30 ENCOUNTER — Encounter: Payer: Self-pay | Admitting: Obstetrics and Gynecology

## 2020-10-30 LAB — URINALYSIS, COMPLETE W/RFL CULTURE
Bilirubin Urine: NEGATIVE
Glucose, UA: NEGATIVE
Hyaline Cast: NONE SEEN /LPF
Ketones, ur: NEGATIVE
Nitrites, Initial: NEGATIVE
RBC / HPF: >=60 /HPF — AB (ref 0–2)
Specific Gravity, Urine: 1.02 (ref 1.001–1.035)
pH: 6.5 (ref 5.0–8.0)

## 2020-10-30 LAB — URINE CULTURE
MICRO NUMBER:: 12309540
SPECIMEN QUALITY:: ADEQUATE

## 2020-10-30 LAB — CULTURE INDICATED

## 2020-10-30 MED ORDER — PHENAZOPYRIDINE HCL 200 MG PO TABS: 200.0000 mg | ORAL_TABLET | Freq: Three times a day (TID) | ORAL | 0 refills | Status: DC

## 2020-10-30 MED ORDER — SULFAMETHOXAZOLE-TRIMETHOPRIM 800-160 MG PO TABS: 1.0000 | ORAL_TABLET | Freq: Two times a day (BID) | ORAL | 0 refills | Status: DC

## 2020-10-30 NOTE — Telephone Encounter (Unsigned)
Please contact the patient.  I would like to offer her Bactrim DS po bid x 3 days for potential bladder infection.  I would also recommend pyridium 200 mg po tid x 2 -3 days to treat irritation.  The pyridium treats bladder and urethral pain but does not treat infection.  It turns the urine bright red-orange.   I will let her know when her culture report has finalized, but at least this may give her some relief in the meantime!

## 2020-11-05 ENCOUNTER — Ambulatory Visit: Payer: 59 | Admitting: Obstetrics and Gynecology

## 2020-11-24 NOTE — Progress Notes (Signed)
GYNECOLOGY  VISIT   HPI: 65 y.o.   Single  African American  female   [phone] with No LMP recorded (lmp unknown). Patient has had a hysterectomy.   here for breast and pelvic exam.   Patient recently seen with postmenopausal bleeding due to atrophy and recent resumption of sexual activity.  STD screening negative.   Bleeding stopped.  Using coconut oil now.  Used it yesterday.  No dysuria.   Wants refill of Valtrex and lidocaine ointment.  She uses the lidocaine under her left arm, which is the side of breast cancer care.  Wants to see a counselor.  GYNECOLOGIC HISTORY: No LMP recorded (lmp unknown). Patient has had a hysterectomy. Contraception:  Hyst Menopausal hormone therapy:  None Last mammogram: 11-30-19 MR Breast Bil--SEE EPIC Last pap smear:   10/31/19 Neg, Neg HR HPV, 10-23-18 Neg:Neg HR HPV, 08-10-17 ASCUS:Neg HR HPV         OB History     Gravida  2   Para  2   Term  0   Preterm  0   AB  0   Living  2      SAB  0   IAB  0   Ectopic  0   Multiple  0   Live Births  0              Patient Active Problem List   Diagnosis Date Noted   Papillary thyroid carcinoma ([place]) 04/07/2020   Post-operative hypothyroidism 10/02/2019   Neoplasm of uncertain behavior of thyroid gland 09/16/2019   Multiple thyroid nodules 09/16/2019   COVID-19 virus infection 02/20/2019   Environmental allergies 01/12/2018   Port-A-Cath in place 11/09/2017   Malignant neoplasm of upper-outer quadrant of left breast in female, estrogen receptor positive ([place]) 08/10/2017    Past Medical History:  Diagnosis Date   Abnormal Pap smear of vagina 07/28/2016   LGSIL; colpo 07/2016 atrophic squamous cells; colpo 07/2017 - atypia   Allergy    Breast cancer ([place]) 2018   Metastatic Left breast   Elevated hemoglobin A1c 07/28/2016   level - 6.1   Endometriosis    GERD (gastroesophageal reflux disease)    HSV-2 infection    Iron deficiency anemia    Low vitamin D level 07/28/2016    level 24.7   Personal history of chemotherapy    finished 10/19   Personal history of radiation therapy    finished 03/2018   Thyroid cancer ([place])    Thyroid neoplasm     Past Surgical History:  Procedure Laterality Date   ABDOMINAL HYSTERECTOMY     BREAST BIOPSY Left 08/09/2017   BREAST LUMPECTOMY Left 01/22/2018   BREAST LUMPECTOMY WITH RADIOACTIVE SEED AND AXILLARY LYMPH NODE DISSECTION Left 01/22/2018   Procedure: LEFT BREAST LUMPECTOMY WITH RADIOACTIVE SEED AND COMPLETE LEFT AXILLARY LYMPH NODE DISSECTION;  Surgeon: [name], MD;  Location: [facility];  Service: General;  Laterality: Left;   CESAREAN SECTION     COLON SURGERY     COLOSTOMY     COLOSTOMY REVERSAL     FOOT SURGERY Right    done spur   OOPHORECTOMY     PORT-A-CATH REMOVAL Right 08/28/2018   Procedure: REMOVAL PORT-A-CATH;  Surgeon: [name], MD;  Location: [facility];  Service: General;  Laterality: Right;   PORTACATH PLACEMENT N/A 09/06/2017   Procedure: INSERTION PORT-A-CATH;  Surgeon: [name], MD;  Location: [facility];  Service: General;  Laterality: N/A;  SMALL INTESTINE SURGERY     SPINE SURGERY     Injections   THYROIDECTOMY N/A 09/20/2019   Procedure: TOTAL THYROIDECTOMY;  Surgeon: [name], MD;  [facility];  Service: General;  Laterality: N/A;    Current Outpatient Medications  Medication Sig Dispense Refill   anastrozole (ARIMIDEX) 1 MG tablet TAKE 1 TABLET BY MOUTH EVERY DAY 30 tablet 14   Artificial Tear Solution (OPTI-TEARS OP) Place 1 drop into both eyes 3 (three) times daily as needed (for dry/irritated eyes.).     Ascorbic Acid (VITAMIN C) 100 MG CHEW Chew 100 mg by mouth daily.      BORIC ACID EX Apply topically.      Cholecalciferol 25 MCG (1000 UT) tablet Take by mouth.     fluticasone (FLONASE) 50 MCG/ACT nasal spray SPRAY 2 SPRAYS INTO EACH NOSTRIL EVERY DAY 16 mL 2   ibuprofen (ADVIL) 800 MG tablet Take 800 mg by mouth 3 (three)  times daily as needed.     levothyroxine (SYNTHROID) 125 MCG tablet Take 1 tablet (125 mcg total) by mouth as directed. Half a tablet on Sundays, 1 tablet the rest of the week 85 tablet 3   lidocaine (XYLOCAINE) 5 % ointment Apply 1 application topically 3 (three) times daily. Uses as needed. 1.25 g 1   Multiple Vitamins-Minerals (VITAMIN D3 COMPLETE PO) Take 1 tablet by mouth daily.      naproxen sodium (ALEVE) 220 MG tablet Take 220 mg by mouth 2 (two) times daily as needed (FOR PAIN.).      Probiotic Product (ALIGN PO) Take by mouth.      valACYclovir (VALTREX) 500 MG tablet Take 1 tablet (500 mg total) by mouth daily. Take one tablet (500 mg) twice a day for three days as needed. 110 tablet 0   No current facility-administered medications for this visit.     ALLERGIES: Penicillins, Bee venom, and Tramadol  Family History  Problem Relation Age of Onset   Mental illness Mother    Alcoholism Mother    Cirrhosis Mother    Cancer Father    Lung cancer Father     Social History   Socioeconomic History   Marital status: Single    Spouse name: Not on file   Number of children: 2   Years of education: Not on file   Highest education level: Not on file  Occupational History   Not on file  Tobacco Use   Smoking status: Never   Smokeless tobacco: Never  Vaping Use   Vaping Use: Never used  Substance and Sexual Activity   Alcohol use: Yes    Alcohol/week: 1.0 standard drink    Types: 1 Glasses of wine per week   Drug use: Not Currently    Types: Marijuana    Comment: everyday   Sexual activity: Not Currently    Birth control/protection: Post-menopausal, Surgical    Comment: Hysterectomy  Other Topics Concern   Not on file  Social History Narrative   Retired from [employer] -- retiring next April   Has children    Social Determinants of Radio broadcast assistant Strain: Not on [employer] Insecurity: Not on file  Transportation Needs: Not on file   Physical Activity: Not on file  Stress: Not on file  Social Connections: Not on file  Intimate Partner Violence: Not on file    Review of Systems  All other systems reviewed and are negative.  PHYSICAL EXAMINATION:  BP 138/76   Pulse 73   Ht 5\' 8"  (1.727 m)   Wt 191 lb (86.6 kg)   LMP  (LMP Unknown)   SpO2 99%   BMI 29.04 kg/m     General appearance: alert, cooperative and appears stated age Head: Normocephalic, without obvious abnormality, atraumatic Neck: no adenopathy, supple, symmetrical, trachea midline and thyroid normal to inspection and palpation Lungs: clear to auscultation bilaterally Breasts: normal appearance, no masses or tenderness, No nipple retraction or dimpling, No nipple discharge or bleeding, No axillary or supraclavicular adenopathy Heart: regular rate and rhythm Abdomen: soft, non-tender, no masses,  no organomegaly Extremities: extremities normal, atraumatic, no cyanosis or edema Skin: Skin color, texture, turgor normal. No rashes or lesions Lymph nodes: Cervical, supraclavicular, and axillary nodes normal. No abnormal inguinal nodes palpated Neurologic: Grossly normal  Pelvic: External genitalia:  no lesions              Urethra:  normal appearing urethra with no masses, tenderness or lesions              Bartholins and Skenes: normal                 Vagina: normal appearing vagina with normal color and discharge, no lesions              Cervix: no lesions                Bimanual Exam:  Uterus:  normal size, contour, position, consistency, mobility, non-tender              Adnexa: no mass, fullness, tenderness              Rectal exam: yes.  Confirms.              Anus:  normal sphincter tone, no lesions  Chaperone was present for exam:  Raquel Sarna, RN.  ASSESSMENT  Postmenopausal bleeding due to atrophy.  Resolved.  Status post TAH/BSO/appy.  Hx metastatic left breast cancer. On Arimidex.  Hx VAIN I.  Hx HSV II.  Interest in counseling.    PLAN  Pap and HR HPV testing.  Sees Dr. Jana Hakim 12/10/20.  She will discuss her mammogram schedule with him at that time.  BMD due in 2023.  Refill of Valtrex and Lidocaine ointment.  Brochure to patient for [facility] counseling.  FU yearly.  An After Visit Summary was printed and given to the patient.  36 min  total time was spent for this patient encounter, including preparation, face-to-face counseling with the patient, coordination of care, and documentation of the encounter.

## 2020-11-26 ENCOUNTER — Encounter: Payer: Self-pay | Admitting: Obstetrics and Gynecology

## 2020-11-26 ENCOUNTER — Ambulatory Visit (INDEPENDENT_AMBULATORY_CARE_PROVIDER_SITE_OTHER): Payer: Medicare Other | Admitting: Obstetrics and Gynecology

## 2020-11-26 ENCOUNTER — Other Ambulatory Visit (HOSPITAL_COMMUNITY)
Admission: RE | Admit: 2020-11-26 | Discharge: 2020-11-26 | Disposition: A | Discharge status: Home / Self Care | Payer: Medicare Other | Source: Ambulatory Visit | Attending: Obstetrics and Gynecology | Admitting: Obstetrics and Gynecology

## 2020-11-26 ENCOUNTER — Other Ambulatory Visit: Payer: Self-pay

## 2020-11-26 VITALS — BP 138/76 | HR 73 | Ht 68.0 in | Wt 191.0 lb

## 2020-11-26 DIAGNOSIS — N952 Postmenopausal atrophic vaginitis: Secondary | ICD-10-CM | POA: Diagnosis not present

## 2020-11-26 DIAGNOSIS — Z5181 Encounter for therapeutic drug level monitoring: Secondary | ICD-10-CM

## 2020-11-26 DIAGNOSIS — Z1151 Encounter for screening for human papillomavirus (HPV): Secondary | ICD-10-CM | POA: Insufficient documentation

## 2020-11-26 DIAGNOSIS — Z01419 Encounter for gynecological examination (general) (routine) without abnormal findings: Secondary | ICD-10-CM | POA: Diagnosis present

## 2020-11-26 DIAGNOSIS — Z87411 Personal history of vaginal dysplasia: Secondary | ICD-10-CM

## 2020-11-26 MED ORDER — LIDOCAINE 5 % EX OINT: 1.0000 "application " | TOPICAL_OINTMENT | Freq: Three times a day (TID) | CUTANEOUS | 1 refills | Status: DC

## 2020-11-26 MED ORDER — VALACYCLOVIR HCL 500 MG PO TABS: 500.0000 mg | ORAL_TABLET | Freq: Every day | ORAL | 11 refills | Status: DC

## 2020-11-26 NOTE — Patient Instructions (Signed)

## 2020-11-27 LAB — CYTOLOGY - PAP
Comment: NEGATIVE
Diagnosis: NEGATIVE
High risk HPV: NEGATIVE

## 2020-12-03 ENCOUNTER — Encounter: Payer: Self-pay | Admitting: Oncology

## 2020-12-09 ENCOUNTER — Other Ambulatory Visit: Payer: Self-pay | Admitting: *Deleted

## 2020-12-09 DIAGNOSIS — Z170 Estrogen receptor positive status [ER+]: Secondary | ICD-10-CM

## 2020-12-10 ENCOUNTER — Inpatient Hospital Stay: Payer: Medicare Other

## 2020-12-10 ENCOUNTER — Encounter: Payer: Self-pay | Admitting: Oncology

## 2020-12-10 ENCOUNTER — Other Ambulatory Visit: Payer: Self-pay

## 2020-12-10 ENCOUNTER — Inpatient Hospital Stay: Payer: Medicare Other | Attending: Oncology | Admitting: Oncology

## 2020-12-10 VITALS — BP 145/85 | HR 68 | Temp 97.7°F | Resp 18 | Ht 68.0 in | Wt 192.0 lb

## 2020-12-10 DIAGNOSIS — E890 Postprocedural hypothyroidism: Secondary | ICD-10-CM

## 2020-12-10 DIAGNOSIS — C73 Malignant neoplasm of thyroid gland: Secondary | ICD-10-CM | POA: Diagnosis not present

## 2020-12-10 DIAGNOSIS — Z79811 Long term (current) use of aromatase inhibitors: Secondary | ICD-10-CM | POA: Insufficient documentation

## 2020-12-10 DIAGNOSIS — Z170 Estrogen receptor positive status [ER+]: Secondary | ICD-10-CM

## 2020-12-10 DIAGNOSIS — C50412 Malignant neoplasm of upper-outer quadrant of left female breast: Secondary | ICD-10-CM

## 2020-12-10 DIAGNOSIS — C773 Secondary and unspecified malignant neoplasm of axilla and upper limb lymph nodes: Secondary | ICD-10-CM | POA: Insufficient documentation

## 2020-12-10 LAB — CBC WITH DIFFERENTIAL (CANCER CENTER ONLY)
Abs Immature Granulocytes: 0.02 10*3/uL (ref 0.00–0.07)
Basophils Absolute: 0 10*3/uL (ref 0.0–0.1)
Basophils Relative: 1 %
Eosinophils Absolute: 0.1 10*3/uL (ref 0.0–0.5)
Eosinophils Relative: 1 %
HCT: 40.3 % (ref 36.0–46.0)
Hemoglobin: 12.8 g/dL (ref 12.0–15.0)
Immature Granulocytes: 0 %
Lymphocytes Relative: 34 %
Lymphs Abs: 2.9 10*3/uL (ref 0.7–4.0)
MCH: 26.6 pg (ref 26.0–34.0)
MCHC: 31.8 g/dL (ref 30.0–36.0)
MCV: 83.8 fL (ref 80.0–100.0)
Monocytes Absolute: 0.6 10*3/uL (ref 0.1–1.0)
Monocytes Relative: 7 %
Neutro Abs: 4.8 10*3/uL (ref 1.7–7.7)
Neutrophils Relative %: 57 %
Platelet Count: 223 10*3/uL (ref 150–400)
RBC: 4.81 MIL/uL (ref 3.87–5.11)
RDW: 14.6 % (ref 11.5–15.5)
WBC Count: 8.4 10*3/uL (ref 4.0–10.5)
nRBC: 0 % (ref 0.0–0.2)

## 2020-12-10 LAB — CMP (CANCER CENTER ONLY)
ALT: 17 U/L (ref 0–44)
AST: 18 U/L (ref 15–41)
Albumin: 4.5 g/dL (ref 3.5–5.0)
Alkaline Phosphatase: 125 U/L (ref 38–126)
Anion gap: 11 (ref 5–15)
BUN: 12 mg/dL (ref 8–23)
CO2: 21 mmol/L — ABNORMAL LOW (ref 22–32)
Calcium: 10.3 mg/dL (ref 8.9–10.3)
Chloride: 106 mmol/L (ref 98–111)
Creatinine: 0.84 mg/dL (ref 0.44–1.00)
GFR, Estimated: >60 mL/min (ref >60)
Glucose, Bld: 90 mg/dL (ref 70–99)
Potassium: 3.5 mmol/L (ref 3.5–5.1)
Sodium: 138 mmol/L (ref 135–145)
Total Bilirubin: 0.8 mg/dL (ref 0.3–1.2)
Total Protein: 7.9 g/dL (ref 6.5–8.1)

## 2020-12-10 MED ORDER — ANASTROZOLE 1 MG PO TABS: 1.0000 mg | ORAL_TABLET | Freq: Every day | ORAL | 14 refills | Status: DC

## 2020-12-10 NOTE — Progress Notes (Signed)
[facility]  [phone] [phone]    ID: Madison Hopkins DOB: 1955-08-06  MR#: [id number]  DSK#:[id number]  Patient Care Team: [name], Oak Level as PCP - General (Physician Assistant) Kameron Blethen, [name] Dad, MD as Consulting Physician (Oncology) [name], [name] All, MD as Consulting Physician (Obstetrics and Gynecology) Bensimhon, [name], MD as Consulting Physician (Cardiology) [name], MD as Attending Physician (Physical Medicine and Rehabilitation) [facility], [name], MD as Consulting Physician (Endocrinology) [name], MD as Consulting Physician (General Surgery) OTHER MD:   CHIEF COMPLAINT: HER-2 positive, weakly estrogen receptor positive breast cancer  CURRENT TREATMENT: Anastrozole   INTERVAL HISTORY: Madison Hopkins returns today for follow-up of her HER-2 positive, weakly estrogen receptor positive breast cancer.   She continues on Anastrozole with fair tolerance.  Overall she feels whenever mild side effects she has she is able to deal with  Her most recent bone density screening from 11/28/2019 showed a T-score of -1.5, which is mildlyosteopenic.  She is overdue for mammography, last on 11/28/2019 with breast MRI on 11/30/2019.  Of note, she experienced a fall on 09/10/20 and was found to have a right 7th rib fracture.  She can has no symptoms related to that.   REVIEW OF SYSTEMS: Madison Hopkins is enjoying her retirement.  She took a trip to the [place] which was "wonderful".  She is exercising by walking in the park, doing Silver sneakers, and some swimming.  She is very engaged with her family.  Detailed review of systems was otherwise stable   COVID 19 VACCINATION STATUS: [facility] x3, most recently 01/2020; infection 01/2019 and 08/2020 (received molnupiravir)   HISTORY OF CURRENT ILLNESS: From the original intake note:  Madison Hopkins noted a mass in the left axilla sometime in January or February 2019.  She  eventually brought her to her gynecologist's attention, and underwent bilateral diagnostic mammography with tomography and left breast ultrasonography at [facility] on 08/04/2017 showing: breast density category B. There is a highly suspicious hypoechoic mass in the left breast at the 2 o'clock upper outer quadrant measuring 2.3 x 1.6 x 2.2 cm, located 2 cm from the nipple. Sonographically, there were 2 enlarged lymph nodes in the left axilla, the largest with cortical thickening measuring 2.5 cm. No evidence of malignancy was seen in the right breast.   Accordingly on 08/07/2017 she proceeded to biopsy of the left breast area and 1 of the lymph nodes in question. The pathology from this procedure showed ([id number]): Invasive ductal carcinoma, grade 3. Metastatic carcinoma was found in one left axillary lymph node. Prognostic indicators significant for: estrogen receptor, 30% positive with weak staining intensity and progesterone receptor, 0% negative. Proliferation marker Ki67 at 80%. HER2 amplified with ratios HER2/CEP17 signals 2.32 and average HER2 copies per cell 6.60  The patient's subsequent history is as detailed below.   PAST MEDICAL HISTORY: Past Medical History:  Diagnosis Date   Abnormal Pap smear of vagina 07/28/2016   LGSIL; colpo 07/2016 atrophic squamous cells; colpo 07/2017 - atypia   Allergy    Breast cancer ([place]) 2018   Metastatic Left breast   Elevated hemoglobin A1c 07/28/2016   level - 6.1   Endometriosis    GERD (gastroesophageal reflux disease)    HSV-2 infection    Iron deficiency anemia    Low vitamin D level 07/28/2016   level 24.7   Personal history of chemotherapy    finished 10/19   Personal history of radiation therapy  finished 03/2018   Thyroid cancer ([place])    Thyroid neoplasm   GERD but no ulcers   PAST SURGICAL HISTORY: Past Surgical History:  Procedure Laterality Date   ABDOMINAL HYSTERECTOMY     BREAST BIOPSY Left 08/09/2017   BREAST  LUMPECTOMY Left 01/22/2018   BREAST LUMPECTOMY WITH RADIOACTIVE SEED AND AXILLARY LYMPH NODE DISSECTION Left 01/22/2018   Procedure: LEFT BREAST LUMPECTOMY WITH RADIOACTIVE SEED AND COMPLETE LEFT AXILLARY LYMPH NODE DISSECTION;  Surgeon: [name], MD;  Location: [facility];  Service: General;  Laterality: Left;   CESAREAN SECTION     COLON SURGERY     COLOSTOMY     COLOSTOMY REVERSAL     FOOT SURGERY Right    done spur   OOPHORECTOMY     PORT-A-CATH REMOVAL Right 08/28/2018   Procedure: REMOVAL PORT-A-CATH;  Surgeon: [name], MD;  Location: [facility];  Service: General;  Laterality: Right;   PORTACATH PLACEMENT N/A 09/06/2017   Procedure: INSERTION PORT-A-CATH;  Surgeon: [name], MD;  Location: [facility];  Service: General;  Laterality: N/A;   SMALL INTESTINE SURGERY     SPINE SURGERY     Injections   THYROIDECTOMY N/A 09/20/2019   Procedure: TOTAL THYROIDECTOMY;  Surgeon: [name], MD;  Location: [facility];  Service: General;  Laterality: N/A;  Hysterectomy with salpingo-oophorectomy, Tonsillectomy, Plantar Fascitis Right Foot Surgery    FAMILY HISTORY: Family History  Problem Relation Age of Onset   Mental illness Mother    Alcoholism Mother    Cirrhosis Mother    Cancer Father    Lung cancer Father    The patient' father died at age 71 due to lung cancer (heavy smoker). The patient's mother died due to liver cirrhosis (heavy drinker). The patient has 2 brothers and no sisters. There was a paternal 1st cousin with colon cancer diagnosed in the mid 40's, who also had cervical cancer. The mother of this 1st cousin (the patient's paternal aunt) had cancer (the patient's is unsure of what type). There was also a paternal uncle with prostate cancer diagnosed in the 30's. The patient denies a family history of breast or ovarian cancer.     GYNECOLOGIC HISTORY:  No LMP recorded (lmp unknown). Patient has had a hysterectomy. Menarche: 65  years old Age at first live birth: 65 years old She is GXP2.  The patient is status post total hysterectomy with bilateral salpingo-oophorectomy in 1995.  She never used contraception. She notes that she had an estrogen shot one time but no other HRTs.    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  She describes herself single. At home is herself and no pets. Her son, Madison Hopkins lives in [place], [place] where he works as a Musician. The patient's daughter Madison Hopkins lives in [place] in Therapist, art for [employer]. The patient has 5 grandchildren and 4 great grandchildren. She attends [employer].   ADVANCED DIRECTIVES: at the 08/16/2017 visit the patient was given the appropriate forms to complete on notarized at her discretion   HEALTH MAINTENANCE: Social History   Tobacco Use   Smoking status: Never   Smokeless tobacco: Never  Vaping Use   Vaping Use: Never used  Substance Use Topics   Alcohol use: Yes    Alcohol/week: 1.0 standard drink    Types: 1 Glasses of wine per week   Drug use: Not Currently    Types:  Marijuana    Comment: everyday     Colonoscopy: 2009?  PAP: 10/2019, negative  Bone density: 10/24/2016, -0.7   Allergies  Allergen Reactions   Penicillins Nausea Only, Hives and Nausea And Vomiting    Has patient had a PCN reaction causing immediate rash, facial/tongue/throat swelling, SOB or lightheadedness with hypotension: No Has patient had a PCN reaction causing severe rash involving mucus membranes or skin necrosis: No Has patient had a PCN reaction that required hospitalization: No Has patient had a PCN reaction occurring within the last 10 years: Yes--nausea & headache ONLY If all of the above answers are "NO", then may proceed with Cephalosporin use.  Has patient had a PCN reaction causing immediate rash, facial/tongue/throat swelling, SOB or lightheadedness with hypotension: No Has patient had a  PCN reaction causing severe rash involving mucus membranes or skin necrosis: No Has patient had a PCN reaction that required hospitalization: No Has patient had a PCN reaction occurring within the last 10 years: Yes--nausea & headache ONLY If all of the above answers are "NO", then may proceed with Cephalosporin use. Has patient had a PCN reaction causing immediate rash, facial/tongue/throat swelling, SOB or lightheadedness with hypotension: No Has patient had a PCN reaction causing severe rash involving mucus membranes or skin necrosis: No Has patient had a PCN reaction that required hospitalization: No Has patient had a PCN reaction occurring within the last 10 years: Yes--nausea & headache ONLY If all of the above answers are "NO", then may proceed with Cephalosporin use.   Bee Venom    Tramadol Nausea Only    Current Outpatient Medications  Medication Sig Dispense Refill   anastrozole (ARIMIDEX) 1 MG tablet TAKE 1 TABLET BY MOUTH EVERY DAY 30 tablet 14   Artificial Tear Solution (OPTI-TEARS OP) Place 1 drop into both eyes 3 (three) times daily as needed (for dry/irritated eyes.).     Ascorbic Acid (VITAMIN C) 100 MG CHEW Chew 100 mg by mouth daily.      BORIC ACID EX Apply topically.      Cholecalciferol 25 MCG (1000 UT) tablet Take by mouth.     fluticasone (FLONASE) 50 MCG/ACT nasal spray SPRAY 2 SPRAYS INTO EACH NOSTRIL EVERY DAY 16 mL 2   ibuprofen (ADVIL) 800 MG tablet Take 800 mg by mouth 3 (three) times daily as needed.     levothyroxine (SYNTHROID) 125 MCG tablet Take 1 tablet (125 mcg total) by mouth as directed. Half a tablet on Sundays, 1 tablet the rest of the week 85 tablet 3   lidocaine (XYLOCAINE) 5 % ointment Apply 1 application topically 3 (three) times daily. Uses as needed. 1.25 g 1   Multiple Vitamins-Minerals (VITAMIN D3 COMPLETE PO) Take 1 tablet by mouth daily.      naproxen sodium (ALEVE) 220 MG tablet Take 220 mg by mouth 2 (two) times daily as needed (FOR  PAIN.).      Probiotic Product (ALIGN PO) Take by mouth.      valACYclovir (VALTREX) 500 MG tablet Take 1 tablet (500 mg total) by mouth daily. Take one tablet (500 mg) twice a day for three days as needed. 36 tablet 11   No current facility-administered medications for this visit.    OBJECTIVE: African-American woman who appears younger than stated age  Vitals:   12/10/20 1257  BP: (!) 145/85  Pulse: 68  Resp: 18  Temp: 97.7 F (36.5 C)  SpO2: 100%     Body mass index is 29.19 kg/m.  Wt Readings from Last 3 Encounters:  12/10/20 192 lb (87.1 kg)  11/26/20 191 lb (86.6 kg)  10/27/20 190 lb (86.2 kg)  ECOG FS:1 - Symptomatic but completely ambulatory  Sclerae unicteric, EOMs intact Wearing a mask No cervical or supraclavicular adenopathy Lungs no rales or rhonchi Heart regular rate and rhythm Abd soft, nontender, positive bowel sounds MSK no focal spinal tenderness, no upper extremity lymphedema Neuro: nonfocal, well oriented, appropriate affect Breasts: The right breast is unremarkable.  The left breast is status postlumpectomy and radiation.  There is no evidence of disease recurrence.  Both axillae are benign.   LAB RESULTS:  CMP     Component Value Date/Time   NA 142 01/14/2020 0852   K 4.1 01/14/2020 0852   CL 108 01/14/2020 0852   CO2 23 01/14/2020 0852   GLUCOSE 106 (H) 01/14/2020 0852   BUN 14 01/14/2020 0852   CREATININE 0.90 01/14/2020 0852   CALCIUM 10.2 01/14/2020 0852   PROT 7.1 01/14/2020 0852   ALBUMIN 4.1 12/12/2019 1430   AST 14 01/14/2020 0852   AST 23 10/25/2017 1055   ALT 17 01/14/2020 0852   ALT 30 10/25/2017 1055   ALKPHOS 159 (H) 12/12/2019 1430   BILITOT 0.5 01/14/2020 0852   BILITOT 0.6 10/25/2017 1055   GFRNONAA >60 12/12/2019 1430   GFRNONAA >60 10/25/2017 1055   GFRAA >60 09/21/2019 0627   GFRAA >60 10/25/2017 1055    No results found for: TOTALPROTELP, ALBUMINELP, A1GS, A2GS, BETS, BETA2SER, GAMS, MSPIKE, SPEI  No results  found for: KPAFRELGTCHN, LAMBDASER, KAPLAMBRATIO  Lab Results  Component Value Date   WBC 8.4 12/10/2020   NEUTROABS 4.8 12/10/2020   HGB 12.8 12/10/2020   HCT 40.3 12/10/2020   MCV 83.8 12/10/2020   PLT 223 12/10/2020   No results found for: LABCA2  No components found for: [id number]  No results for input(s): INR in the last 168 hours.  No results found for: LABCA2  No results found for: WUJ811  No results found for: BJY782  No results found for: NFA213  No results found for: CA2729  No components found for: HGQUANT  No results found for: CEA1 / No results found for: CEA1   No results found for: AFPTUMOR  No results found for: CHROMOGRNA  No results found for: TOTALPROTELP, ALBUMINELP, A1GS, A2GS, BETS, BETA2SER, GAMS, MSPIKE, SPEI (this displays SPEP labs)  No results found for: KPAFRELGTCHN, LAMBDASER, KAPLAMBRATIO (kappa/lambda light chains)  No results found for: HGBA, HGBA2QUANT, HGBFQUANT, HGBSQUAN (Hemoglobinopathy evaluation)   No results found for: LDH  No results found for: IRON, TIBC, IRONPCTSAT (Iron and TIBC)  No results found for: FERRITIN  Urinalysis    Component Value Date/Time   COLORURINE DARK YELLOW 10/27/2020 1435   APPEARANCEUR CLOUDY (A) 10/27/2020 1435   LABSPEC 1.020 10/27/2020 1435   PHURINE 6.5 10/27/2020 1435   GLUCOSEU NEGATIVE 10/27/2020 1435   HGBUR 3+ (A) 10/27/2020 1435   BILIRUBINUR n 02/01/2019 0909   KETONESUR NEGATIVE 10/27/2020 1435   PROTEINUR 1+ (A) 10/27/2020 1435   UROBILINOGEN negative (A) 02/01/2019 0909   NITRITE n 02/01/2019 0909   LEUKOCYTESUR Negative 02/01/2019 0909    STUDIES: No results found.   ELIGIBLE FOR AVAILABLE RESEARCH PROTOCOL: No   ASSESSMENT: 65 y.o. [place], [place] woman status post left breast upper outer quadrant and left axillary lymph node biopsy 08/07/2017, both positive for a T2 N1, stage IIB invasive ductal carcinoma, grade 3, estrogen receptor weakly positive, progesterone  receptor negative, but  HER-2 amplified, with an MIB-1 of 80%  (a) staging chest CT and bone scan 08/30/2017 showed no evidence of metastatic disease  (1) neoadjuvant chemotherapy will consist of carboplatin, docetaxel, trastuzumab, and Pertuzumab given every 21 days x 6 starting 09/07/2017, perjeta omitted with cycle 2 and 3 due to diarrhea  (a) Gemcitabine substituted for Docetaxel beginning with cycle 5 and 6 for concerns of neuropathy  (2) trastuzumab continued to complete 6 months, last dose 04/06/2018  (a) echocardiogram 08/28/2017 showed an ejection fraction in the 60-65% range.  (b) echocardiogram on 11/29/2017 showed an EF of 55-60%  (c) echocardiogram 03/07/2018 showed an ejection fraction in the 55-60% range  (3) Left lumpectomy on 01/22/2018 shows a ypT1b pN1a, grade 3 residual invasive ductal carcinoma, agnostic panel HER-2 negative, ER 40% weakly positive, PR negative.   (a) foundation one testing requested on 02/02/2018  (b) total of 11 axillary lymph nodes removed (1+)  (4) adjuvant radiation completed 04/20/2018  (5) started anastrozole 05/30/2018  (a) bone density 10/25/2016 normal with a T score of -0.7  (b) bone density 11/28/2019 shows a T score of -1.5  (6) status post total thyroidectomy 09/20/2019 for papillary thyroid carcinoma, pT1b pNX, with negative margins   PLAN: Ellisha is now just about 3 years out from definitive surgery for her breast cancer with no evidence of disease recurrence.  This is very favorable.  She is tolerating anastrozole well and the plan is to continue that a minimum of 5 years.  She is a little behind on her mammography and I have put the order in for her to have 1 later this month.  With her 2023 mammography she will be due for repeat DEXA scan and I have entered that order as well.  I have commended her excellent exercise program.  Incidentally she is using lidocaine 5% cream topically in her left axilla where she tends to have still some  pain from the prior treatments.  She finds this very helpful.  Total encounter time 25 minutes.Sarajane Jews C. Kathy Wahid, MD 12/10/20 12:59 PM Medical Oncology and Hematology [facility] [place], [place] [address] Tel. [phone]    Fax. [phone]   I, Wilburn Mylar, am acting as scribe for Dr. [name] Dad. Errol Ala.  I, Lurline Del MD, have reviewed the above documentation for accuracy and completeness, and I agree with the above.    *Total Encounter Time as defined by the Centers for Medicare and Medicaid Services includes, in addition to the face-to-face time of a patient visit (documented in the note above) non-face-to-face time: obtaining and reviewing outside history, ordering and reviewing medications, tests or procedures, care coordination (communications with other health care professionals or caregivers) and documentation in the medical record.

## 2020-12-17 ENCOUNTER — Other Ambulatory Visit: Payer: Self-pay | Admitting: Oncology

## 2020-12-17 DIAGNOSIS — C73 Malignant neoplasm of thyroid gland: Secondary | ICD-10-CM

## 2020-12-17 DIAGNOSIS — E890 Postprocedural hypothyroidism: Secondary | ICD-10-CM

## 2020-12-17 DIAGNOSIS — C50412 Malignant neoplasm of upper-outer quadrant of left female breast: Secondary | ICD-10-CM

## 2020-12-17 DIAGNOSIS — Z170 Estrogen receptor positive status [ER+]: Secondary | ICD-10-CM

## 2021-01-16 ENCOUNTER — Other Ambulatory Visit: Payer: Self-pay

## 2021-01-16 ENCOUNTER — Ambulatory Visit (HOSPITAL_COMMUNITY)
Admission: EM | Admit: 2021-01-16 | Discharge: 2021-01-16 | Disposition: A | Discharge status: Home / Self Care | Payer: Medicare Other

## 2021-01-16 ENCOUNTER — Encounter (HOSPITAL_COMMUNITY): Payer: Self-pay | Admitting: Emergency Medicine

## 2021-01-16 DIAGNOSIS — H9202 Otalgia, left ear: Secondary | ICD-10-CM

## 2021-01-16 DIAGNOSIS — H65192 Other acute nonsuppurative otitis media, left ear: Secondary | ICD-10-CM | POA: Diagnosis not present

## 2021-01-16 MED ORDER — FLUCONAZOLE 150 MG PO TABS: 150.0000 mg | ORAL_TABLET | Freq: Once | ORAL | 0 refills | Status: AC

## 2021-01-16 MED ORDER — DOXYCYCLINE HYCLATE 100 MG PO CAPS: 100.0000 mg | ORAL_CAPSULE | Freq: Two times a day (BID) | ORAL | 0 refills | Status: DC

## 2021-01-16 NOTE — ED Provider Notes (Signed)
[place]   [id number] 01/16/21 Arrival Time: 3570  Chief Complaint  Patient presents with   Otalgia     SUBJECTIVE: History from: patient.  Madison Hopkins is a 65 y.o. female who presented to the urgent care for complaint of left ear pain for the past 5 days.  Denies a precipitating event, such as swimming or wearing ear plugs.  Patient states the pain is constant and achy in character.  Patient has tried OTC medication without relief.  Symptoms are made worse with lying down.  Denies similar symptoms in the past. Denies fever, chills, fatigue, sinus pain, rhinorrhea, ear discharge, sore throat, SOB, wheezing, chest pain, nausea, changes in bowel or bladder habits.    ROS: As per HPI.  All other pertinent ROS negative.      Past Medical History:  Diagnosis Date   Abnormal Pap smear of vagina 07/28/2016   LGSIL; colpo 07/2016 atrophic squamous cells; colpo 07/2017 - atypia   Allergy    Breast cancer ([place]) 2018   Metastatic Left breast   Elevated hemoglobin A1c 07/28/2016   level - 6.1   Endometriosis    GERD (gastroesophageal reflux disease)    HSV-2 infection    Iron deficiency anemia    Low vitamin D level 07/28/2016   level 24.7   Personal history of chemotherapy    finished 10/19   Personal history of radiation therapy    finished 03/2018   Thyroid cancer ([place])    Thyroid neoplasm    Past Surgical History:  Procedure Laterality Date   ABDOMINAL HYSTERECTOMY     BREAST BIOPSY Left 08/09/2017   BREAST LUMPECTOMY Left 01/22/2018   BREAST LUMPECTOMY WITH RADIOACTIVE SEED AND AXILLARY LYMPH NODE DISSECTION Left 01/22/2018   Procedure: LEFT BREAST LUMPECTOMY WITH RADIOACTIVE SEED AND COMPLETE LEFT AXILLARY LYMPH NODE DISSECTION;  Surgeon: [name], MD;  Location: [facility];  Service: General;  Laterality: Left;   [place]     FOOT SURGERY Right    done spur    OOPHORECTOMY     PORT-A-CATH REMOVAL Right 08/28/2018   Procedure: REMOVAL PORT-A-CATH;  Surgeon: [name], MD;  Location: [facility];  Service: General;  Laterality: Right;   PORTACATH PLACEMENT N/A 09/06/2017   Procedure: INSERTION PORT-A-CATH;  Surgeon: [name], MD;  Location: [facility];  Service: General;  Laterality: N/A;   SMALL INTESTINE SURGERY     SPINE SURGERY     Injections   THYROIDECTOMY N/A 09/20/2019   Procedure: TOTAL THYROIDECTOMY;  Surgeon: [name], MD;  Location: [facility];  Service: General;  Laterality: N/A;   Allergies  Allergen Reactions   Penicillins Nausea Only, Hives and Nausea And Vomiting    Has patient had a PCN reaction causing immediate rash, facial/tongue/throat swelling, SOB or lightheadedness with hypotension: No Has patient had a PCN reaction causing severe rash involving mucus membranes or skin necrosis: No Has patient had a PCN reaction that required hospitalization: No Has patient had a PCN reaction occurring within the last 10 years: Yes--nausea & headache ONLY If all of the above answers are "NO", then may proceed with Cephalosporin use.  Has patient had a PCN reaction causing immediate rash, facial/tongue/throat swelling, SOB or lightheadedness with hypotension: No Has patient had a PCN reaction causing severe rash involving mucus membranes or skin necrosis: No Has patient had a PCN reaction that  required hospitalization: No Has patient had a PCN reaction occurring within the last 10 years: Yes--nausea & headache ONLY If all of the above answers are "NO", then may proceed with Cephalosporin use. Has patient had a PCN reaction causing immediate rash, facial/tongue/throat swelling, SOB or lightheadedness with hypotension: No Has patient had a PCN reaction causing severe rash involving mucus membranes or skin necrosis: No Has patient had a PCN reaction that required hospitalization: No Has patient had a PCN reaction occurring  within the last 10 years: Yes--nausea & headache ONLY If all of the above answers are "NO", then may proceed with Cephalosporin use.   Bee Venom    Tramadol Nausea Only   No current facility-administered medications on file prior to encounter.   Current Outpatient Medications on File Prior to Encounter  Medication Sig Dispense Refill   NON FORMULARY Mucinex Flonase Alka seltzer cold     anastrozole (ARIMIDEX) 1 MG tablet Take 1 tablet (1 mg total) by mouth daily. 90 tablet 14   Artificial Tear Solution (OPTI-TEARS OP) Place 1 drop into both eyes 3 (three) times daily as needed (for dry/irritated eyes.).     Ascorbic Acid (VITAMIN C) 100 MG CHEW Chew 100 mg by mouth daily.      BORIC ACID EX Apply topically.      Cholecalciferol 25 MCG (1000 UT) tablet Take by mouth.     fluticasone (FLONASE) 50 MCG/ACT nasal spray SPRAY 2 SPRAYS INTO EACH NOSTRIL EVERY DAY 16 mL 2   ibuprofen (ADVIL) 800 MG tablet Take 800 mg by mouth 3 (three) times daily as needed.     levothyroxine (SYNTHROID) 125 MCG tablet Take 1 tablet (125 mcg total) by mouth as directed. Half a tablet on Sundays, 1 tablet the rest of the week 85 tablet 3   lidocaine (XYLOCAINE) 5 % ointment Apply 1 application topically 3 (three) times daily. Uses as needed. 1.25 g 1   Multiple Vitamins-Minerals (VITAMIN D3 COMPLETE PO) Take 1 tablet by mouth daily.      naproxen sodium (ALEVE) 220 MG tablet Take 220 mg by mouth 2 (two) times daily as needed (FOR PAIN.).      Probiotic Product (ALIGN PO) Take by mouth.      valACYclovir (VALTREX) 500 MG tablet Take 1 tablet (500 mg total) by mouth daily. Take one tablet (500 mg) twice a day for three days as needed. 36 tablet 11   [DISCONTINUED] prochlorperazine (COMPAZINE) 10 MG tablet Take 1 tablet (10 mg total) by mouth every 6 (six) hours as needed (Nausea or vomiting). 30 tablet 1   Social History   Socioeconomic History   Marital status: Single    Spouse name: Not on file   Number of  children: 2   Years of education: Not on file   Highest education level: Not on file  Occupational History   Not on file  Tobacco Use   Smoking status: Never   Smokeless tobacco: Never  Vaping Use   Vaping Use: Never used  Substance and Sexual Activity   Alcohol use: Yes    Alcohol/week: 1.0 standard drink    Types: 1 Glasses of wine per week   Drug use: Not Currently    Types: Marijuana    Comment: everyday   Sexual activity: Not Currently    Birth control/protection: Post-menopausal, Surgical    Comment: Hysterectomy  Other Topics Concern   Not on file  Social History Narrative   Retired from [employer] --  retiring next April   Has children    Social Determinants of Radio broadcast assistant Strain: Not on file  Food Insecurity: Not on file  Transportation Needs: Not on file  Physical Activity: Not on file  Stress: Not on file  Social Connections: Not on file  Intimate Partner Violence: Not on file   Family History  Problem Relation Age of Onset   Mental illness Mother    Alcoholism Mother    Cirrhosis Mother    Cancer Father    Lung cancer Father     OBJECTIVE:  Vitals:   01/16/21 1158  BP: 124/75  Pulse: 65  Resp: 18  Temp: 98.6 F (37 C)  TempSrc: Oral  SpO2: 97%     Physical Exam Vitals and nursing note reviewed.  Constitutional:      General: She is not in acute distress.    Appearance: Normal appearance. She is normal weight. She is not ill-appearing, toxic-appearing or diaphoretic.  HENT:     Head: Normocephalic.     Right Ear: Tympanic membrane, ear canal and external ear normal. There is no impacted cerumen.     Left Ear: Ear canal and external ear normal. Tenderness present. There is no impacted cerumen. Tympanic membrane is erythematous and bulging.  Cardiovascular:     Rate and Rhythm: Normal rate and regular rhythm.     Pulses: Normal pulses.     Heart sounds: Normal heart sounds. No murmur heard.   No friction rub.  No gallop.  Pulmonary:     Effort: Pulmonary effort is normal. No respiratory distress.     Breath sounds: Normal breath sounds. No stridor. No wheezing, rhonchi or rales.  Chest:     Chest wall: No tenderness.  Neurological:     Mental Status: She is alert and oriented to person, place, and time.     Imaging: No results found.   ASSESSMENT & PLAN:  1. Left ear pain   2. Other non-recurrent acute nonsuppurative otitis media of left ear     Meds ordered this encounter  Medications   doxycycline (VIBRAMYCIN) 100 MG capsule    Sig: Take 1 capsule (100 mg total) by mouth 2 (two) times daily.    Dispense:  20 capsule    Refill:  0    Discharge Instructions  Rest and drink plenty of fluids Prescribed doxycycline twice daily for 7 days Continue to use Flonase daily  Take medications as directed and to completion Continue to use OTC ibuprofen and/ or tylenol as needed for pain control Follow up with PCP if symptoms persists Return here or go to the ER if you have any new or worsening symptoms   Reviewed expectations re: course of current medical issues. Questions answered. Outlined signs and symptoms indicating need for more acute intervention. Patient verbalized understanding. After Visit Summary given.          Emerson Monte, FNP 01/16/21 1216

## 2021-01-16 NOTE — ED Triage Notes (Signed)
01/11/2021 started not feeling well.  Patient had nausea, patient took a laxative and now stomach feeling much better.  Patient has had left side of head pain and left ear pain.  Patient has slight sniffles.  Denies cough, reports a low grade fever.

## 2021-01-16 NOTE — Discharge Instructions (Addendum)
Rest and drink plenty of fluids Prescribed doxycycline twice daily for 10 days Continue to use Flonase daily  Take medications as directed and to completion Continue to use OTC ibuprofen and/ or tylenol as needed for pain control Follow up with PCP if symptoms persists Return here or go to the ER if you have any new or worsening symptoms

## 2021-01-27 ENCOUNTER — Encounter: Payer: Self-pay | Admitting: Oncology

## 2021-01-27 ENCOUNTER — Other Ambulatory Visit: Payer: Self-pay | Admitting: *Deleted

## 2021-01-27 ENCOUNTER — Ambulatory Visit
Admission: RE | Admit: 2021-01-27 | Discharge: 2021-01-27 | Disposition: A | Discharge status: Home / Self Care | Payer: Medicare Other | Source: Ambulatory Visit | Attending: Oncology | Admitting: Oncology

## 2021-01-27 ENCOUNTER — Ambulatory Visit: Admission: RE | Admit: 2021-01-27 | Payer: Medicare Other | Source: Ambulatory Visit

## 2021-01-27 DIAGNOSIS — C73 Malignant neoplasm of thyroid gland: Secondary | ICD-10-CM

## 2021-01-27 DIAGNOSIS — E890 Postprocedural hypothyroidism: Secondary | ICD-10-CM

## 2021-01-27 DIAGNOSIS — C50412 Malignant neoplasm of upper-outer quadrant of left female breast: Secondary | ICD-10-CM

## 2021-02-01 ENCOUNTER — Other Ambulatory Visit: Payer: Self-pay

## 2021-02-01 ENCOUNTER — Ambulatory Visit (INDEPENDENT_AMBULATORY_CARE_PROVIDER_SITE_OTHER): Payer: Medicare Other | Admitting: Physician Assistant

## 2021-02-01 ENCOUNTER — Encounter: Payer: Self-pay | Admitting: Physician Assistant

## 2021-02-01 VITALS — BP 128/72 | HR 69 | Temp 97.8°F | Ht 68.9 in | Wt 195.2 lb

## 2021-02-01 DIAGNOSIS — C73 Malignant neoplasm of thyroid gland: Secondary | ICD-10-CM | POA: Diagnosis not present

## 2021-02-01 DIAGNOSIS — Z0001 Encounter for general adult medical examination with abnormal findings: Secondary | ICD-10-CM | POA: Diagnosis not present

## 2021-02-01 DIAGNOSIS — B009 Herpesviral infection, unspecified: Secondary | ICD-10-CM

## 2021-02-01 DIAGNOSIS — H9202 Otalgia, left ear: Secondary | ICD-10-CM | POA: Diagnosis not present

## 2021-02-01 DIAGNOSIS — E669 Obesity, unspecified: Secondary | ICD-10-CM

## 2021-02-01 DIAGNOSIS — C50412 Malignant neoplasm of upper-outer quadrant of left female breast: Secondary | ICD-10-CM | POA: Diagnosis not present

## 2021-02-01 DIAGNOSIS — R739 Hyperglycemia, unspecified: Secondary | ICD-10-CM

## 2021-02-01 DIAGNOSIS — Z170 Estrogen receptor positive status [ER+]: Secondary | ICD-10-CM

## 2021-02-01 LAB — POCT URINALYSIS DIPSTICK
Bilirubin, UA: NEGATIVE
Blood, UA: NEGATIVE
Glucose, UA: NEGATIVE
Ketones, UA: NEGATIVE
Leukocytes, UA: NEGATIVE
Nitrite, UA: NEGATIVE
Protein, UA: NEGATIVE
Spec Grav, UA: 1.01 (ref 1.010–1.025)
Urobilinogen, UA: 0.2 E.U./dL
pH, UA: 6 (ref 5.0–8.0)

## 2021-02-01 LAB — LIPID PANEL
Cholesterol: 173 mg/dL (ref 0–200)
HDL: 35.7 mg/dL — ABNORMAL LOW (ref >39.00)
NonHDL: 136.84
Total CHOL/HDL Ratio: 5
Triglycerides: 285 mg/dL — ABNORMAL HIGH (ref 0.0–149.0)
VLDL: 57 mg/dL — ABNORMAL HIGH (ref 0.0–40.0)

## 2021-02-01 LAB — HEMOGLOBIN A1C: Hgb A1c MFr Bld: 6.3 % (ref 4.6–6.5)

## 2021-02-01 LAB — LDL CHOLESTEROL, DIRECT: Direct LDL: 107 mg/dL

## 2021-02-01 NOTE — Patient Instructions (Signed)
It was great to see you!  Please see your endocrinologist about your ear pain If ongoing, we will try to get you to see ENT, just let me know  Please go to the lab for blood work.   Our office will call you with your results unless you have chosen to receive results via MyChart.  If your blood work is normal we will follow-up each year for physicals and as scheduled for chronic medical problems.  If anything is abnormal we will treat accordingly and get you in for a follow-up.  Take care,  Aldona Bar

## 2021-02-01 NOTE — Progress Notes (Signed)
Subjective:    Madison Hopkins is a 65 y.o. female and is here for a comprehensive physical exam.  HPI  Health Maintenance Due  Topic Date Due   Pneumonia Vaccine 65+ Years old (1 - PCV) Never done   Zoster Vaccines- Shingrix (1 of 2) Never done   INFLUENZA VACCINE  09/28/2020   COVID-19 Vaccine (5 - Booster for Pfizer series) 12/17/2020   Acute Concerns: Left Ear Pain Madison Hopkins recently visited the UC with c/o left ear pain that had been onset for five days at that time. She stated the pain was constant and worse upon lying down. Madison Hopkins reported she did try OTC medications such as mucinex all which provided no relief. Denied fever, chills, fatigue, sinus pain, rhinorrhea, ear discharge, SOB, CP, nausea, or sore throat.   Following this visit she was prescribed doxycycline 100 mg twice daily. Currently Madison Hopkins has been compliant with taking the medication but states she has been feeling nausea and breaking out on her nose while taking the medication. Due to this, she stopped taking the medication, with four pills left.   At this time, she is still experiencing some left ear pain and nasal congestion. According to pt, she did have a low grade fever, but this has resolved itself.   Chronic Issues: Hx of Breast Cancer  In 2019, Madison Hopkins noticed a mass in her left axilla and brought up to er gynecologist. Upon under going bilateral diagnostic mammography with tomography and left breast ultrasonography showing suspicious hypoechoic mass in the left breast at the 2 o'clock upper outer quadrant measuring 2.3 x 1.6 x 2.2 cm, located 2 cm from the nipple. Metastatic carcinoma was found in one left axillary lymph node. Currently Madison Hopkins is compliant with anastrozole with fair tolerance. She is regularly seeing Dr. Jana Hakim, oncology and managing well.   Hx of Thyroid Cancer  Pt underwent a total thyroidectomy last July due to papillary thyroid carcinoma, 1.3 cm, in the left thyroid lobe. Currently she  is following up regularly with Dr. Kelton Pillar about this issue. She is managing well. Currently compliant with synthroid 125 mcg daily with no adverse effects. Denies hair loss, brittle nails, heart palpitations, or cold/heat intolerance. She is managing well.   HSV Infection Pt states she is currently taking valtrex 500 mg every other day with no adverse effects. She says she has not had a flare up in a while and is managing well. Current issue is being managed by Dr. Quincy Simmonds, gynecology.   Hyperglycemia Hx of elevated glucose in the past. Most recent A1c was 6.5%. She was recommended to follow-up with me when she had this elevated A1c last year, but she did not. She is trying hard to eat healthy and exercise as able. Denies polyuria/polydipsia.  Health Maintenance: Immunizations -- Covid- UTD Influenza- Due at this time Tdap- UTD; 2020 Colonoscopy -- UTD via cologuard 01/14/20 Mammogram -- 01/27/21 PAP -- UTD; 10/2020 Bone Density -- UTD; 11/28/19 Diet -- Eats all food groups Sleep habits -- Normal Schedule Exercise -- Walking regularly  Weight -- Stable Mood -- Stable Weight history: Wt Readings from Last 10 Encounters:  02/01/21 195 lb 3.2 oz (88.5 kg)  12/10/20 192 lb (87.1 kg)  11/26/20 191 lb (86.6 kg)  10/27/20 190 lb (86.2 kg)  10/13/20 199 lb (90.3 kg)  06/25/20 200 lb 1.6 oz (90.8 kg)  04/07/20 205 lb 4 oz (93.1 kg)  02/18/20 206 lb (93.4 kg)  01/30/20 205 lb (93 kg)  01/17/20  205 lb (93 kg)   Body mass index is 28.91 kg/m. No LMP recorded (lmp unknown). Patient has had a hysterectomy. Alcohol use:  reports current alcohol use of about 1.0 standard drink per week. Tobacco use:  Tobacco Use: Low Risk    Smoking Tobacco Use: Never   Smokeless Tobacco Use: Never   Passive Exposure: Not on file     Depression screen [facility] 2/9 02/01/2021  Decreased Interest 0  Down, Depressed, Hopeless 0  PHQ - 2 Score 0  Some recent data might be hidden     Other  providers/specialists: Patient Care Team: [name], [place] as PCP - General (Physician Assistant) [name], [name] Dad, MD as Consulting Physician (Oncology) [name], [name] All, MD as Consulting Physician (Obstetrics and Gynecology) Bensimhon, [name], MD as Consulting Physician (Cardiology) [name], MD as Attending Physician (Physical Medicine and Rehabilitation) [facility], [name], MD as Consulting Physician (Endocrinology) [name], MD as Consulting Physician (General Surgery)    PMHx, SurgHx, SocialHx, Medications, and Allergies were reviewed in the Visit Navigator and updated as appropriate.   Past Medical History:  Diagnosis Date   Abnormal Pap smear of vagina 07/28/2016   LGSIL; colpo 07/2016 atrophic squamous cells; colpo 07/2017 - atypia   Allergy    Breast cancer ([place]) 2018   Metastatic Left breast   Elevated hemoglobin A1c 07/28/2016   level - 6.1   Endometriosis    GERD (gastroesophageal reflux disease)    HSV-2 infection    Iron deficiency anemia    Low vitamin D level 07/28/2016   level 24.7   Personal history of chemotherapy    finished 10/19   Personal history of radiation therapy    finished 03/2018   Thyroid cancer [facility])    Thyroid neoplasm      Past Surgical History:  Procedure Laterality Date   ABDOMINAL HYSTERECTOMY     BREAST BIOPSY Left 08/09/2017   BREAST LUMPECTOMY Left 01/22/2018   BREAST LUMPECTOMY WITH RADIOACTIVE SEED AND AXILLARY LYMPH NODE DISSECTION Left 01/22/2018   Procedure: LEFT BREAST LUMPECTOMY WITH RADIOACTIVE SEED AND COMPLETE LEFT AXILLARY LYMPH NODE DISSECTION;  Surgeon: [name], MD;  Location: [facility];  Service: General;  Laterality: Left;   [place]     FOOT SURGERY Right    done spur   OOPHORECTOMY     PORT-A-CATH REMOVAL Right 08/28/2018   Procedure: REMOVAL PORT-A-CATH;  Surgeon: [name], MD;   Location: [facility];  Service: General;  Laterality: Right;   PORTACATH PLACEMENT N/A 09/06/2017   Procedure: INSERTION PORT-A-CATH;  Surgeon: [name], MD;  Location: [facility];  Service: General;  Laterality: N/A;   SMALL INTESTINE SURGERY     SPINE SURGERY     Injections   THYROIDECTOMY N/A 09/20/2019   Procedure: TOTAL THYROIDECTOMY;  Surgeon: [name], MD;  [facility];  Service: General;  Laterality: N/A;     Family History  Problem Relation Age of Onset   Mental illness Mother    Alcoholism Mother    Cirrhosis Mother    Cancer Father    Lung cancer Father     Social History   Tobacco Use   Smoking status: Never   Smokeless tobacco: Never  Vaping Use   Vaping Use: Never used  Substance Use Topics   Alcohol use: Yes    Alcohol/week: 1.0 standard drink  Types: 1 Glasses of wine per week   Drug use: Not Currently    Types: Marijuana    Comment: everyday    Review of Systems:   Review of Systems  Constitutional:  Negative for chills, fever, malaise/fatigue and weight loss.  HENT:  Negative for hearing loss, sinus pain and sore throat.   Respiratory:  Negative for cough and hemoptysis.   Cardiovascular:  Negative for chest pain, palpitations, leg swelling and PND.  Gastrointestinal:  Negative for abdominal pain, constipation, diarrhea, heartburn, nausea and vomiting.  Genitourinary:  Negative for dysuria, frequency and urgency.  Musculoskeletal:  Negative for back pain, myalgias and neck pain.  Skin:  Negative for itching and rash.  Neurological:  Negative for dizziness, tingling, seizures and headaches.  Endo/Heme/Allergies:  Negative for polydipsia.  Psychiatric/Behavioral:  Negative for depression. The patient is not nervous/anxious.    Objective:   BP 128/72   Pulse 69   Temp 97.8 F (36.6 C) (Temporal)   Ht 5' 8.9" (1.75 m)   Wt 195 lb 3.2 oz (88.5 kg)   LMP  (LMP Unknown)   SpO2 99%   BMI 28.91 kg/m  Body mass index is  28.91 kg/m.   General Appearance:    Alert, cooperative, no distress, appears stated age  Head:    Normocephalic, without obvious abnormality, atraumatic  Eyes:    PERRL, conjunctiva/corneas clear, EOM's intact, fundi    benign, both eyes  Ears:    Normal TM's and external ear canals, both ears  Nose:   Nares normal, septum midline, mucosa normal, no drainage    or sinus tenderness  Throat:   Lips, mucosa, and tongue normal; teeth and gums normal  Neck:   Supple, symmetrical, trachea midline, no adenopathy;    thyroid:  no enlargement/tenderness/nodules; no carotid   bruit or JVD  Back:     Symmetric, no curvature, ROM normal, no CVA tenderness  Lungs:     Clear to auscultation bilaterally, respirations unlabored  Chest Wall:    No tenderness or deformity   Heart:    Regular rate and rhythm, S1 and S2 normal, no murmur, rub or gallop  Breast Exam:    Deferred  Abdomen:     Soft, non-tender, bowel sounds active all four quadrants,    no masses, no organomegaly  Genitalia:    Deferred  Extremities:   Extremities normal, atraumatic, no cyanosis or edema  Pulses:   2+ and symmetric all extremities  Skin:   Skin color, texture, turgor normal, no rashes or lesions  Lymph nodes:   Cervical, supraclavicular, and axillary nodes normal  Neurologic:   CNII-XII intact, normal strength, sensation and reflexes    throughout   Results for orders placed or performed in visit on 02/01/21  POCT Urinalysis Dipstick  Result Value Ref Range   Color, UA yellow    Clarity, UA clear    Glucose, UA Negative Negative   Bilirubin, UA neg    Ketones, UA neg    Spec Grav, UA 1.010 1.010 - 1.025   Blood, UA neg    pH, UA 6.0 5.0 - 8.0   Protein, UA Negative Negative   Urobilinogen, UA 0.2 0.2 or 1.0 E.U./dL   Nitrite, UA neg    Leukocytes, UA Negative Negative   Appearance     Odor      Assessment/Plan:   Encounter for general adult medical examination with abnormal findings Today patient  counseled on age appropriate routine health concerns for  screening and prevention, each reviewed and up to date or declined. Immunizations reviewed and up to date or declined. Labs ordered and reviewed. Risk factors for depression reviewed and negative. Hearing function and visual acuity are intact. ADLs screened and addressed as needed. Functional ability and level of safety reviewed and appropriate. Education, counseling and referrals performed based on assessed risks today. Patient provided with a copy of personalized plan for preventive services.  Left ear pain Exam benign Recommended patient to follow up with endocrinology (she reports that her thyroid issues "started as ear pain") and requests to see them Advised patient if symptoms continue, will discuss referral to ENT  HSV-2 (herpes simplex virus 2) infection Continue Valtrex 500 mg daily  Managed by Dr. Quincy Simmonds, gynecology  Obesity, unspecified classification, unspecified obesity type, unspecified whether serious comorbidity present Encouraged patient to continue regular exercise and healthier dietary options  Hyperglycemia Will update labs today She is also requesting a urinalysis -- this was WNL  Breast cancer; Thyroid cancer Management per oncology    Patient Counseling: [x]    Nutrition: Stressed importance of moderation in sodium/caffeine intake, saturated fat and cholesterol, caloric balance, sufficient intake of fresh fruits, vegetables, fiber, calcium, iron, and 1 mg of folate supplement per day (for females capable of pregnancy).  [x]    Stressed the importance of regular exercise.   [x]    Substance Abuse: Discussed cessation/primary prevention of tobacco, alcohol, or other drug use; driving or other dangerous activities under the influence; availability of treatment for abuse.   [x]    Injury prevention: Discussed safety belts, safety helmets, smoke detector, smoking near bedding or upholstery.   [x]    Sexuality: Discussed  sexually transmitted diseases, partner selection, use of condoms, avoidance of unintended pregnancy  and contraceptive alternatives.  [x]    Dental health: Discussed importance of regular tooth brushing, flossing, and dental visits.  [x]    Health maintenance and immunizations reviewed. Please refer to Health maintenance section.   I,Madison Hopkins,acting as a Education administrator for [employer], PA.,have documented all relevant documentation on the behalf of [name], PA,as directed by  [name], PA while in the presence of [name], [place].  I, [name], [place], have reviewed all documentation for this visit. The documentation on 02/01/21 for the exam, diagnosis, procedures, and orders are all accurate and complete.   [name], PA-C [place]

## 2021-02-04 ENCOUNTER — Encounter: Payer: Self-pay | Admitting: Physician Assistant

## 2021-03-22 DIAGNOSIS — M4722 Other spondylosis with radiculopathy, cervical region: Secondary | ICD-10-CM | POA: Diagnosis not present

## 2021-03-22 DIAGNOSIS — M25511 Pain in right shoulder: Secondary | ICD-10-CM | POA: Diagnosis not present

## 2021-03-22 DIAGNOSIS — S46811A Strain of other muscles, fascia and tendons at shoulder and upper arm level, right arm, initial encounter: Secondary | ICD-10-CM | POA: Diagnosis not present

## 2021-03-25 DIAGNOSIS — M4722 Other spondylosis with radiculopathy, cervical region: Secondary | ICD-10-CM | POA: Diagnosis not present

## 2021-03-25 DIAGNOSIS — M6281 Muscle weakness (generalized): Secondary | ICD-10-CM | POA: Diagnosis not present

## 2021-03-25 DIAGNOSIS — S46811D Strain of other muscles, fascia and tendons at shoulder and upper arm level, right arm, subsequent encounter: Secondary | ICD-10-CM | POA: Diagnosis not present

## 2021-03-26 DIAGNOSIS — C50412 Malignant neoplasm of upper-outer quadrant of left female breast: Secondary | ICD-10-CM | POA: Diagnosis not present

## 2021-03-26 DIAGNOSIS — Z170 Estrogen receptor positive status [ER+]: Secondary | ICD-10-CM | POA: Diagnosis not present

## 2021-03-29 DIAGNOSIS — M6281 Muscle weakness (generalized): Secondary | ICD-10-CM | POA: Diagnosis not present

## 2021-03-29 DIAGNOSIS — S46811D Strain of other muscles, fascia and tendons at shoulder and upper arm level, right arm, subsequent encounter: Secondary | ICD-10-CM | POA: Diagnosis not present

## 2021-03-29 DIAGNOSIS — M4722 Other spondylosis with radiculopathy, cervical region: Secondary | ICD-10-CM | POA: Diagnosis not present

## 2021-03-31 DIAGNOSIS — M4722 Other spondylosis with radiculopathy, cervical region: Secondary | ICD-10-CM | POA: Diagnosis not present

## 2021-03-31 DIAGNOSIS — S46811D Strain of other muscles, fascia and tendons at shoulder and upper arm level, right arm, subsequent encounter: Secondary | ICD-10-CM | POA: Diagnosis not present

## 2021-03-31 DIAGNOSIS — M6281 Muscle weakness (generalized): Secondary | ICD-10-CM | POA: Diagnosis not present

## 2021-04-05 DIAGNOSIS — M4722 Other spondylosis with radiculopathy, cervical region: Secondary | ICD-10-CM | POA: Diagnosis not present

## 2021-04-05 DIAGNOSIS — M25511 Pain in right shoulder: Secondary | ICD-10-CM | POA: Diagnosis not present

## 2021-04-05 DIAGNOSIS — S46811D Strain of other muscles, fascia and tendons at shoulder and upper arm level, right arm, subsequent encounter: Secondary | ICD-10-CM | POA: Diagnosis not present

## 2021-04-05 DIAGNOSIS — M6281 Muscle weakness (generalized): Secondary | ICD-10-CM | POA: Diagnosis not present

## 2021-04-08 DIAGNOSIS — M4722 Other spondylosis with radiculopathy, cervical region: Secondary | ICD-10-CM | POA: Diagnosis not present

## 2021-04-08 DIAGNOSIS — M67911 Unspecified disorder of synovium and tendon, right shoulder: Secondary | ICD-10-CM | POA: Diagnosis not present

## 2021-04-08 DIAGNOSIS — M6281 Muscle weakness (generalized): Secondary | ICD-10-CM | POA: Diagnosis not present

## 2021-04-08 DIAGNOSIS — S46811D Strain of other muscles, fascia and tendons at shoulder and upper arm level, right arm, subsequent encounter: Secondary | ICD-10-CM | POA: Diagnosis not present

## 2021-04-08 DIAGNOSIS — M24811 Other specific joint derangements of right shoulder, not elsewhere classified: Secondary | ICD-10-CM | POA: Diagnosis not present

## 2021-04-12 DIAGNOSIS — M6281 Muscle weakness (generalized): Secondary | ICD-10-CM | POA: Diagnosis not present

## 2021-04-12 DIAGNOSIS — S46811D Strain of other muscles, fascia and tendons at shoulder and upper arm level, right arm, subsequent encounter: Secondary | ICD-10-CM | POA: Diagnosis not present

## 2021-04-12 DIAGNOSIS — M4722 Other spondylosis with radiculopathy, cervical region: Secondary | ICD-10-CM | POA: Diagnosis not present

## 2021-04-13 ENCOUNTER — Encounter: Payer: Self-pay | Admitting: Oncology

## 2021-04-15 DIAGNOSIS — M6281 Muscle weakness (generalized): Secondary | ICD-10-CM | POA: Diagnosis not present

## 2021-04-15 DIAGNOSIS — S46811D Strain of other muscles, fascia and tendons at shoulder and upper arm level, right arm, subsequent encounter: Secondary | ICD-10-CM | POA: Diagnosis not present

## 2021-04-15 DIAGNOSIS — M4722 Other spondylosis with radiculopathy, cervical region: Secondary | ICD-10-CM | POA: Diagnosis not present

## 2021-04-19 ENCOUNTER — Telehealth: Payer: Medicare Other | Admitting: Physician Assistant

## 2021-04-19 DIAGNOSIS — H109 Unspecified conjunctivitis: Secondary | ICD-10-CM | POA: Diagnosis not present

## 2021-04-19 MED ORDER — POLYMYXIN B-TRIMETHOPRIM 10000-0.1 UNIT/ML-% OP SOLN: 1.0000 [drp] | OPHTHALMIC | 0 refills | Status: DC

## 2021-04-19 NOTE — Patient Instructions (Signed)
Madison Hopkins, thank you for joining Mar Daring, PA-C for today's virtual visit.  While this provider is not your primary care provider (PCP), if your PCP is located in our provider database this encounter information will be shared with them immediately following your visit.  Consent: (Patient) Madison Hopkins provided verbal consent for this virtual visit at the beginning of the encounter.  Current Medications:  Current Outpatient Medications:    trimethoprim-polymyxin b (POLYTRIM) ophthalmic solution, Place 1 drop into the left eye every 4 (four) hours., Disp: 10 mL, Rfl: 0   anastrozole (ARIMIDEX) 1 MG tablet, Take 1 tablet (1 mg total) by mouth daily., Disp: 90 tablet, Rfl: 14   Artificial Tear Solution (OPTI-TEARS OP), Place 1 drop into both eyes 3 (three) times daily as needed (for dry/irritated eyes.)., Disp: , Rfl:    Ascorbic Acid (VITAMIN C) 100 MG CHEW, Chew 100 mg by mouth daily. , Disp: , Rfl:    BORIC ACID EX, Apply topically. , Disp: , Rfl:    Cholecalciferol 25 MCG (1000 UT) tablet, Take by mouth. (Patient not taking: Reported on 02/01/2021), Disp: , Rfl:    doxycycline (VIBRAMYCIN) 100 MG capsule, Take 1 capsule (100 mg total) by mouth 2 (two) times daily., Disp: 20 capsule, Rfl: 0   fluticasone (FLONASE) 50 MCG/ACT nasal spray, SPRAY 2 SPRAYS INTO EACH NOSTRIL EVERY DAY, Disp: 16 mL, Rfl: 2   ibuprofen (ADVIL) 800 MG tablet, Take 800 mg by mouth 3 (three) times daily as needed., Disp: , Rfl:    levothyroxine (SYNTHROID) 125 MCG tablet, Take 1 tablet (125 mcg total) by mouth as directed. Half a tablet on Sundays, 1 tablet the rest of the week, Disp: 85 tablet, Rfl: 3   lidocaine (XYLOCAINE) 5 % ointment, Apply 1 application topically 3 (three) times daily. Uses as needed., Disp: 1.25 g, Rfl: 1   Multiple Vitamins-Minerals (VITAMIN D3 COMPLETE PO), Take 1 tablet by mouth daily. , Disp: , Rfl:    naproxen sodium (ALEVE) 220 MG tablet, Take 220 mg by mouth 2 (two) times  daily as needed (FOR PAIN.).  (Patient not taking: Reported on 02/01/2021), Disp: , Rfl:    NON FORMULARY, Mucinex Flonase Alka seltzer cold, Disp: , Rfl:    Probiotic Product (ALIGN PO), Take by mouth. , Disp: , Rfl:    valACYclovir (VALTREX) 500 MG tablet, Take 1 tablet (500 mg total) by mouth daily. Take one tablet (500 mg) twice a day for three days as needed., Disp: 36 tablet, Rfl: 11   Medications ordered in this encounter:  Meds ordered this encounter  Medications   trimethoprim-polymyxin b (POLYTRIM) ophthalmic solution    Sig: Place 1 drop into the left eye every 4 (four) hours.    Dispense:  10 mL    Refill:  0    Order Specific Question:   Supervising Provider    Answer:   Sabra Heck, BRIAN [[id number]]     *If you need refills on other medications prior to your next appointment, please contact your pharmacy*  Follow-Up: Call back or seek an in-person evaluation if the symptoms worsen or if the condition fails to improve as anticipated.  Other Instructions Bacterial Conjunctivitis, Adult Bacterial conjunctivitis is an infection of your conjunctiva. This is the clear membrane that covers the white part of your eye and the inner part of your eyelid. This infection can make your eye: Red or pink. Itchy or irritated. This condition spreads easily from person to person (is contagious)  and from one eye to the other eye. What are the causes? This condition is caused by germs (bacteria). You may get the infection if you come into close contact with: A person who has the infection. Items that have germs on them (are contaminated), such as face towels, contact lens solution, or eye makeup. What increases the risk? You are more likely to get this condition if: You have contact with people who have the infection. You wear contact lenses. You have a sinus infection. You have had a recent eye injury or surgery. You have a weak body defense system (immune system). You have dry eyes. What are  the signs or symptoms?  Thick, yellowish discharge from the eye. Tearing or watery eyes. Itchy eyes. Burning feeling in your eyes. Eye redness. Swollen eyelids. Blurred vision. How is this treated?  Antibiotic eye drops or ointment. Antibiotic medicine taken by mouth. This is used for infections that do not get better with drops or ointment or that last more than 10 days. Cool, wet cloths placed on the eyes. Artificial tears used 2-6 times a day. Follow these instructions at home: Medicines Take or apply your antibiotic medicine as told by your doctor. Do not stop using it even if you start to feel better. Take or apply over-the-counter and prescription medicines only as told by your doctor. Do not touch your eyelid with the eye-drop bottle or the ointment tube. Managing discomfort Wipe any fluid from your eye with a warm, wet washcloth or a cotton ball. Place a clean, cool, wet cloth on your eye. Do this for 10-20 minutes, 3-4 times a day. General instructions Do not wear contacts until the infection is gone. Wear glasses until your doctor says it is okay to wear contacts again. Do not wear eye makeup until the infection is gone. Throw away old eye makeup. Change or wash your pillowcase every day. Do not share towels or washcloths. Wash your hands often with soap and water for at least 20 seconds and especially before touching your face or eyes. Use paper towels to dry your hands. Do not touch or rub your eyes. Do not drive or use heavy machinery if your vision is blurred. Contact a doctor if: You have a fever. You do not get better after 10 days. Get help right away if: You have a fever and your symptoms get worse all of a sudden. You have very bad pain when you move your eye. Your face: Hurts. Is red. Is swollen. You have sudden loss of vision. Summary Bacterial conjunctivitis is an infection of your conjunctiva. This infection spreads easily from person to  person. Wash your hands often with soap and water for at least 20 seconds and especially before touching your face or eyes. Use paper towels to dry your hands. Take or apply your antibiotic medicine as told by your doctor. Contact a doctor if you have a fever or you do not get better after 10 days. This information is not intended to replace advice given to you by your health care provider. Make sure you discuss any questions you have with your health care provider. Document Revised: 05/27/2020 Document Reviewed: 05/27/2020 Elsevier Patient Education  2022 [employer].    If you have been instructed to have an in-person evaluation today at a local Urgent Care facility, please use the link below. It will take you to a list of all of our available [facility], including address, phone number and hours of operation.  Please do not delay care.  [facility]  If you or a family member do not have a primary care provider, use the link below to schedule a visit and establish care. When you choose a [facility] primary care physician or advanced practice provider, you gain a long-term partner in health. Find a Primary Care Provider  Learn more about [facility] in-office and virtual care options: [facility]

## 2021-04-19 NOTE — Progress Notes (Signed)
Virtual Visit Consent   [name], you are scheduled for a virtual visit with a [facility] provider today.     Just as with appointments in the office, your consent must be obtained to participate.  Your consent will be active for this visit and any virtual visit you may have with one of our providers in the next 365 days.     If you have a MyChart account, a copy of this consent can be sent to you electronically.  All virtual visits are billed to your insurance company just like a traditional visit in the office.    As this is a virtual visit, video technology does not allow for your provider to perform a traditional examination.  This may limit your provider's ability to fully assess your condition.  If your provider identifies any concerns that need to be evaluated in person or the need to arrange testing (such as labs, EKG, etc.), we will make arrangements to do so.     Although advances in technology are sophisticated, we cannot ensure that it will always work on either your end or our end.  If the connection with a video visit is poor, the visit may have to be switched to a telephone visit.  With either a video or telephone visit, we are not always able to ensure that we have a secure connection.     I need to obtain your verbal consent now.   Are you willing to proceed with your visit today?    ALESIA OSHIELDS has provided verbal consent on 04/19/2021 for a virtual visit (video or telephone).   Mar Daring, PA-C   Date: 04/19/2021 9:44 AM   Virtual Visit via Video Note   I, Mar Daring, connected with  Madison Hopkins  ([id number], June 24, 1955) on 04/19/21 at  9:30 AM EST by a video-enabled telemedicine application and verified that I am speaking with the correct person using two identifiers.  Location: Patient: Virtual Visit Location Patient: Home Provider: Virtual Visit Location Provider: Home Office   I discussed the limitations of evaluation and management  by telemedicine and the availability of in person appointments. The patient expressed understanding and agreed to proceed.    History of Present Illness: Madison Hopkins is a 66 y.o. who identifies as a female who was assigned female at birth, and is being seen today for possible pink eye.  HPI: Conjunctivitis  The current episode started today. The onset was gradual. The problem occurs continuously. The problem has been gradually worsening. The problem is mild. Associated symptoms include decreased vision, eye itching, photophobia (mild), congestion, eye discharge and eye redness. Pertinent negatives include no fever, no double vision, no headaches, no rhinorrhea, no sore throat, no URI and no eye pain. The left eye is affected. The eye pain is not associated with movement. The eyelid exhibits swelling.     Problems:  Patient Active Problem List   Diagnosis Date Noted   Papillary thyroid carcinoma ([place]) 04/07/2020   Post-operative hypothyroidism 10/02/2019   Neoplasm of uncertain behavior of thyroid gland 09/16/2019   Multiple thyroid nodules 09/16/2019   COVID-19 virus infection 02/20/2019   Environmental allergies 01/12/2018   Port-A-Cath in place 11/09/2017   Malignant neoplasm of upper-outer quadrant of left breast in female, estrogen receptor positive ([place]) 08/10/2017    Allergies:  Allergies  Allergen Reactions   Penicillins Nausea Only, Hives and Nausea And Vomiting    Has patient had a PCN reaction  causing immediate rash, facial/tongue/throat swelling, SOB or lightheadedness with hypotension: No Has patient had a PCN reaction causing severe rash involving mucus membranes or skin necrosis: No Has patient had a PCN reaction that required hospitalization: No Has patient had a PCN reaction occurring within the last 10 years: Yes--nausea & headache ONLY If all of the above answers are "NO", then may proceed with Cephalosporin use.  Has patient had a PCN reaction causing immediate  rash, facial/tongue/throat swelling, SOB or lightheadedness with hypotension: No Has patient had a PCN reaction causing severe rash involving mucus membranes or skin necrosis: No Has patient had a PCN reaction that required hospitalization: No Has patient had a PCN reaction occurring within the last 10 years: Yes--nausea & headache ONLY If all of the above answers are "NO", then may proceed with Cephalosporin use. Has patient had a PCN reaction causing immediate rash, facial/tongue/throat swelling, SOB or lightheadedness with hypotension: No Has patient had a PCN reaction causing severe rash involving mucus membranes or skin necrosis: No Has patient had a PCN reaction that required hospitalization: No Has patient had a PCN reaction occurring within the last 10 years: Yes--nausea & headache ONLY If all of the above answers are "NO", then may proceed with Cephalosporin use.   Bee Venom    Tramadol Nausea Only   Medications:  Current Outpatient Medications:    trimethoprim-polymyxin b (POLYTRIM) ophthalmic solution, Place 1 drop into the left eye every 4 (four) hours., Disp: 10 mL, Rfl: 0   anastrozole (ARIMIDEX) 1 MG tablet, Take 1 tablet (1 mg total) by mouth daily., Disp: 90 tablet, Rfl: 14   Artificial Tear Solution (OPTI-TEARS OP), Place 1 drop into both eyes 3 (three) times daily as needed (for dry/irritated eyes.)., Disp: , Rfl:    Ascorbic Acid (VITAMIN C) 100 MG CHEW, Chew 100 mg by mouth daily. , Disp: , Rfl:    BORIC ACID EX, Apply topically. , Disp: , Rfl:    Cholecalciferol 25 MCG (1000 UT) tablet, Take by mouth. (Patient not taking: Reported on 02/01/2021), Disp: , Rfl:    doxycycline (VIBRAMYCIN) 100 MG capsule, Take 1 capsule (100 mg total) by mouth 2 (two) times daily., Disp: 20 capsule, Rfl: 0   fluticasone (FLONASE) 50 MCG/ACT nasal spray, SPRAY 2 SPRAYS INTO EACH NOSTRIL EVERY DAY, Disp: 16 mL, Rfl: 2   ibuprofen (ADVIL) 800 MG tablet, Take 800 mg by mouth 3 (three) times daily  as needed., Disp: , Rfl:    levothyroxine (SYNTHROID) 125 MCG tablet, Take 1 tablet (125 mcg total) by mouth as directed. Half a tablet on Sundays, 1 tablet the rest of the week, Disp: 85 tablet, Rfl: 3   lidocaine (XYLOCAINE) 5 % ointment, Apply 1 application topically 3 (three) times daily. Uses as needed., Disp: 1.25 g, Rfl: 1   Multiple Vitamins-Minerals (VITAMIN D3 COMPLETE PO), Take 1 tablet by mouth daily. , Disp: , Rfl:    naproxen sodium (ALEVE) 220 MG tablet, Take 220 mg by mouth 2 (two) times daily as needed (FOR PAIN.).  (Patient not taking: Reported on 02/01/2021), Disp: , Rfl:    NON FORMULARY, Mucinex Flonase Alka seltzer cold, Disp: , Rfl:    Probiotic Product (ALIGN PO), Take by mouth. , Disp: , Rfl:    valACYclovir (VALTREX) 500 MG tablet, Take 1 tablet (500 mg total) by mouth daily. Take one tablet (500 mg) twice a day for three days as needed., Disp: 36 tablet, Rfl: 11  Observations/Objective: Patient is well-developed, well-nourished in  no acute distress.  Resting comfortably at home.  Head is normocephalic, atraumatic.  No labored breathing.  Speech is clear and coherent with logical content.  Patient is alert and oriented at baseline.  Left eye is mildly swollen and injected  Assessment and Plan: 1. Bacterial conjunctivitis of left eye - trimethoprim-polymyxin b (POLYTRIM) ophthalmic solution; Place 1 drop into the left eye every 4 (four) hours.  Dispense: 10 mL; Refill: 0  - Suspect bacterial conjunctivitis - Polytrim prescribed - Warm compresses - Good hand hygiene - Seek in person evaluation if not improving or if symptoms worsen  Follow Up Instructions: I discussed the assessment and treatment plan with the patient. The patient was provided an opportunity to ask questions and all were answered. The patient agreed with the plan and demonstrated an understanding of the instructions.  A copy of instructions were sent to the patient via MyChart unless otherwise  noted below.   The patient was advised to call back or seek an in-person evaluation if the symptoms worsen or if the condition fails to improve as anticipated.  Time:  I spent 13 minutes with the patient via telehealth technology discussing the above problems/concerns.    Mar Daring, PA-C

## 2021-04-20 ENCOUNTER — Ambulatory Visit: Payer: Medicare Other | Admitting: Internal Medicine

## 2021-04-21 DIAGNOSIS — S46811D Strain of other muscles, fascia and tendons at shoulder and upper arm level, right arm, subsequent encounter: Secondary | ICD-10-CM | POA: Diagnosis not present

## 2021-04-21 DIAGNOSIS — M6281 Muscle weakness (generalized): Secondary | ICD-10-CM | POA: Diagnosis not present

## 2021-04-21 DIAGNOSIS — M4722 Other spondylosis with radiculopathy, cervical region: Secondary | ICD-10-CM | POA: Diagnosis not present

## 2021-04-26 DIAGNOSIS — M6281 Muscle weakness (generalized): Secondary | ICD-10-CM | POA: Diagnosis not present

## 2021-04-26 DIAGNOSIS — S46811D Strain of other muscles, fascia and tendons at shoulder and upper arm level, right arm, subsequent encounter: Secondary | ICD-10-CM | POA: Diagnosis not present

## 2021-04-26 DIAGNOSIS — M4722 Other spondylosis with radiculopathy, cervical region: Secondary | ICD-10-CM | POA: Diagnosis not present

## 2021-04-27 ENCOUNTER — Other Ambulatory Visit: Payer: Self-pay | Admitting: Family Medicine

## 2021-04-27 DIAGNOSIS — M5412 Radiculopathy, cervical region: Secondary | ICD-10-CM

## 2021-04-29 DIAGNOSIS — M6281 Muscle weakness (generalized): Secondary | ICD-10-CM | POA: Diagnosis not present

## 2021-04-29 DIAGNOSIS — M4722 Other spondylosis with radiculopathy, cervical region: Secondary | ICD-10-CM | POA: Diagnosis not present

## 2021-04-29 DIAGNOSIS — S46811D Strain of other muscles, fascia and tendons at shoulder and upper arm level, right arm, subsequent encounter: Secondary | ICD-10-CM | POA: Diagnosis not present

## 2021-05-05 DIAGNOSIS — S46811D Strain of other muscles, fascia and tendons at shoulder and upper arm level, right arm, subsequent encounter: Secondary | ICD-10-CM | POA: Diagnosis not present

## 2021-05-05 DIAGNOSIS — M4722 Other spondylosis with radiculopathy, cervical region: Secondary | ICD-10-CM | POA: Diagnosis not present

## 2021-05-05 DIAGNOSIS — M6281 Muscle weakness (generalized): Secondary | ICD-10-CM | POA: Diagnosis not present

## 2021-05-06 ENCOUNTER — Encounter: Payer: Self-pay | Admitting: Internal Medicine

## 2021-05-06 ENCOUNTER — Ambulatory Visit (INDEPENDENT_AMBULATORY_CARE_PROVIDER_SITE_OTHER): Payer: Medicare Other | Admitting: Internal Medicine

## 2021-05-06 ENCOUNTER — Other Ambulatory Visit: Payer: Self-pay

## 2021-05-06 VITALS — BP 110/72 | HR 78 | Ht 68.9 in | Wt 199.0 lb

## 2021-05-06 DIAGNOSIS — E890 Postprocedural hypothyroidism: Secondary | ICD-10-CM | POA: Diagnosis not present

## 2021-05-06 DIAGNOSIS — C73 Malignant neoplasm of thyroid gland: Secondary | ICD-10-CM | POA: Diagnosis not present

## 2021-05-06 LAB — TSH: TSH: 0.23 u[IU]/mL — ABNORMAL LOW (ref 0.35–5.50)

## 2021-05-06 NOTE — Progress Notes (Signed)
Name: Madison Hopkins  MRN/ DOB: [id number], October 02, 1955    Age/ Sex: 66 y.o., female     PCP: [name], PA   Reason for Endocrinology Evaluation: [facility]      Initial Endocrinology Clinic Visit: 10/02/2019    PATIENT IDENTIFIER: Madison Hopkins is a 66 y.o., female with a past medical history of Breast Ca and PTC . She has followed with [facility] since 10/02/2019 for consultative assistance with management of her PTC.   HISTORICAL SUMMARY:   Pt has been referred by Dr. [name].  She had an incidental finding of a left thyroid nodule ~1.5 cm in diameter on neck MRI during evaluation of chronic neck pain.  This nodule was noted to enlarge to 2.3 cm max diameter on cervical spine MRI 04/2019.   She is S/P FNA in 06/2019 - with left mid pole cytology report of Suspicious for malignancy (Bethesda category V), no afirma found for this in the chart. And a left inferior pole cytology of Atypia of undetermined significance (Bethesda category III) , with benign afirma     She is S/P Total thyroidectomy  On 7/23rd/2021 with a 1.3 cm papillary thyroid carcinoma on the left with no invasion, free margins and no L.N submitted.     SUBJECTIVE:    Today (05/06/2021):  Madison Hopkins is here for total thyroidectomy secondary to PTC.   She had a tooth extraction this morning  Has been noted with weight gain Has occasional diarrhea and constipation  Denies tremors  Denies palpitations  Denies local  neck swelling   Still undergoing PT for her fall at [facility]    Levothyroxine 125 mcg , half a tablet on sundays and 1 tablet daily the rest of the week - taking 1 tablet daily     HISTORY:  Past Medical History:  Past Medical History:  Diagnosis Date   Abnormal Pap smear of vagina 07/28/2016   LGSIL; colpo 07/2016 atrophic squamous cells; colpo 07/2017 - atypia   Allergy    Arthritis    Breast cancer ([place]) 2018   Metastatic Left breast   Elevated hemoglobin A1c  07/28/2016   level - 6.1   Endometriosis    GERD (gastroesophageal reflux disease)    HSV-2 infection    Iron deficiency anemia    Low vitamin D level 07/28/2016   level 24.7   Personal history of chemotherapy    finished 10/19   Personal history of radiation therapy    finished 03/2018   Thyroid cancer ([place])    Thyroid neoplasm    Past Surgical History:  Past Surgical History:  Procedure Laterality Date   ABDOMINAL HYSTERECTOMY     BREAST BIOPSY Left 08/09/2017   BREAST LUMPECTOMY Left 01/22/2018   BREAST LUMPECTOMY WITH RADIOACTIVE SEED AND AXILLARY LYMPH NODE DISSECTION Left 01/22/2018   Procedure: LEFT BREAST LUMPECTOMY WITH RADIOACTIVE SEED AND COMPLETE LEFT AXILLARY LYMPH NODE DISSECTION;  Surgeon: [name], MD;  Location: [place];  Service: General;  Laterality: Left;   CESAREAN SECTION     COLON SURGERY     COLOSTOMY     COLOSTOMY REVERSAL     FOOT SURGERY Right    done spur   OOPHORECTOMY     PORT-A-CATH REMOVAL Right 08/28/2018   Procedure: REMOVAL PORT-A-CATH;  Surgeon: [name], MD;  Location: [place];  Service: General;  Laterality: Right;   PORTACATH PLACEMENT N/A 09/06/2017   Procedure: INSERTION PORT-A-CATH;  Surgeon:  [name], MD;  Location: [place];  Service: General;  Laterality: N/A;   SMALL INTESTINE SURGERY     SPINE SURGERY     Injections   THYROIDECTOMY N/A 09/20/2019   Procedure: TOTAL THYROIDECTOMY;  Surgeon: [name], MD;  Location[facility];  Service: General;  Laterality: N/A;   TUBAL LIGATION  1995   Social History:  reports that she has never smoked. She has never used smokeless tobacco. She reports current alcohol use of about 1.0 standard drink per week. She reports that she does not currently use drugs after having used the following drugs: Marijuana. Family History:  Family History  Problem Relation Age of Onset   Mental illness Mother    Alcoholism Mother    Cirrhosis Mother     Alcohol abuse Mother    Cancer Father    Lung cancer Father      HOME MEDICATIONS: Allergies as of 05/06/2021       Reactions   Penicillins Nausea Only, Hives, Nausea And Vomiting   Has patient had a PCN reaction causing immediate rash, facial/tongue/throat swelling, SOB or lightheadedness with hypotension: No Has patient had a PCN reaction causing severe rash involving mucus membranes or skin necrosis: No Has patient had a PCN reaction that required hospitalization: No Has patient had a PCN reaction occurring within the last 10 years: Yes--nausea & headache ONLY If all of the above answers are "NO", then may proceed with Cephalosporin use. Has patient had a PCN reaction causing immediate rash, facial/tongue/throat swelling, SOB or lightheadedness with hypotension: No Has patient had a PCN reaction causing severe rash involving mucus membranes or skin necrosis: No Has patient had a PCN reaction that required hospitalization: No Has patient had a PCN reaction occurring within the last 10 years: Yes--nausea & headache ONLY If all of the above answers are "NO", then may proceed with Cephalosporin use. Has patient had a PCN reaction causing immediate rash, facial/tongue/throat swelling, SOB or lightheadedness with hypotension: No Has patient had a PCN reaction causing severe rash involving mucus membranes or skin necrosis: No Has patient had a PCN reaction that required hospitalization: No Has patient had a PCN reaction occurring within the last 10 years: Yes--nausea & headache ONLY If all of the above answers are "NO", then may proceed with Cephalosporin use.   Bee Venom    Tramadol Nausea Only        Medication List        Accurate as of May 06, 2021  1:04 PM. If you have any questions, ask your nurse or doctor.          STOP taking these medications    doxycycline 100 MG capsule Commonly known as: VIBRAMYCIN Stopped by: Dorita Sciara, MD       TAKE these  medications    ALIGN PO Take by mouth.   anastrozole 1 MG tablet Commonly known as: ARIMIDEX Take 1 tablet (1 mg total) by mouth daily.   BORIC ACID EX Apply topically.   Cholecalciferol 25 MCG (1000 UT) tablet Take by mouth.   fluticasone 50 MCG/ACT nasal spray Commonly known as: FLONASE SPRAY 2 SPRAYS INTO EACH NOSTRIL EVERY DAY   ibuprofen 800 MG tablet Commonly known as: ADVIL Take 800 mg by mouth 3 (three) times daily as needed.   levothyroxine 125 MCG tablet Commonly known as: SYNTHROID Take 1 tablet (125 mcg total) by mouth as directed. Half a tablet on Sundays, 1 tablet the rest of the week  lidocaine 5 % ointment Commonly known as: XYLOCAINE Apply 1 application topically 3 (three) times daily. Uses as needed.   naproxen sodium 220 MG tablet Commonly known as: ALEVE Take 220 mg by mouth 2 (two) times daily as needed (FOR PAIN.).   NON FORMULARY Mucinex Flonase Alka seltzer cold   OPTI-TEARS OP Place 1 drop into both eyes 3 (three) times daily as needed (for dry/irritated eyes.).   trimethoprim-polymyxin b ophthalmic solution Commonly known as: Polytrim Place 1 drop into the left eye every 4 (four) hours.   valACYclovir 500 MG tablet Commonly known as: VALTREX Take 1 tablet (500 mg total) by mouth daily. Take one tablet (500 mg) twice a day for three days as needed.   Vitamin C 100 MG Chew Chew 100 mg by mouth daily.   VITAMIN D3 COMPLETE PO Take 1 tablet by mouth daily.          OBJECTIVE:   PHYSICAL EXAM: VS:BP 110/72 (BP Location: Left Arm, Patient Position: Sitting, Cuff Size: Small)    Pulse 78    Ht 5' 8.9" (1.75 m)    Wt 199 lb (90.3 kg)    LMP  (LMP Unknown)    SpO2 99%    BMI 29.47 kg/m    EXAM: General: Pt appears well and is in NAD  Neck: General: Supple without adenopathy. Thyroid: Surgically removed . No nodules appreciated.  Lungs: Clear with good BS bilat with no rales, rhonchi, or wheezes  Heart: Auscultation: RRR.   Abdomen: Normoactive bowel sounds, soft, nontender, without masses or organomegaly palpable  Extremities:  BL LE: No pretibial edema normal ROM and strength.  Mental Status: Judgment, insight: Intact Orientation: Oriented to time, place, and person Mood and affect: No depression, anxiety, or agitation     DATA REVIEWED:   Latest Reference Range & Units 05/06/21 13:37  TSH 0.35 - 5.50 uIU/mL 0.23 (L)    Latest Reference Range & Units 05/06/21 13:37  Thyroglobulin ng/mL 0.1 (L)     Pathology report 09/20/2019 THYROID, TOTAL THYROIDECTOMY:  - Papillary thyroid carcinoma, 1.3 cm.  - Margins not involved.  - Multiple adenomatous nodules.  - See oncology table.   ONCOLOGY TABLE:  THYROID GLAND:  Procedure: Total thyroidectomy.  Tumor Focality: Unifocal.  Tumor Site: Left lobe, mid.  Tumor Size: 1.3 x 1 x 1 cm.  Histologic Type: Papillary thyroid carcinoma.  Margins: Free of tumor.  Angioinvasion: Not identified.  Lymphatic Invasion: Not identified.  Extrathyroidal extension: Not identified.  Regional Lymph Nodes: No lymph nodes submitted.  Pathologic Stage Classification (pTNM, AJCC 8th Edition): pT1b, pNX   Thyroid Ultrasound 04/13/2020  FINDINGS: Thyroid tissue has been surgically removed. No suspicious soft tissue or nodularity at the surgical bed. No enlarged lymph nodes.   IMPRESSION: Expected changes from a total thyroidectomy. No evidence for residual thyroid tissue.   ASSESSMENT / PLAN / RECOMMENDATIONS:   Hx of papillary Thyroid carcinoma (pT1b, pNX) Stage I:     - S/P total thyroidectomy 09/20/2019 - She is at low risk for recurrent based on pathology report  - No indication for RAI ablation at this time - TSH goal 0.5-2.0 uIU/mL  - Will order a thyroid bed ultrasound  - Pt with excellent biochemical response  -TG, TG AB pending    2. Post-operative Hypothyroidism      - She is clinically euthyroid  - Pt educated extensively on the correct way  to take levothyroxine (first thing in the morning with water, 30 minutes before  eating or taking other medications). - Pt encouraged to double dose the following day if she were to miss a dose given long half-life of levothyroxine.  -TSH is below goal unfortunately she did not get my message to reduce her levothyroxine, we will continue with the same recommendations of reducing levothyroxine as below     Medications   -Stop levothyroxine 125 mcg  -Start levothyroxine 112 mcg daily     F/U in 6 months    Signed electronically by: Mack Guise, MD  [facility]  [facility] [address] [place], [place] [address] Phone: [phone] FAX: [phone]      CC: [name], [place] [place] [address] Phone: [phone]  Fax: [phone]   Return to Endocrinology clinic as below: Future Appointments  Date Time Provider [place]  05/11/2021  6:00 PM GI-315 MR 1 GI-315MRI GI-315 W. WE  06/01/2021 11:30 AM [facility] DX DEXA 1 [facility]-BCGDG GI-BREAST CE  11/30/2021  2:00 PM Nunzio Cobbs, MD [facility] None  12/22/2021 11:30 AM [facility] LAB [facility] None  12/22/2021 12:00 PM Benay Pike, MD [facility] None

## 2021-05-06 NOTE — Patient Instructions (Signed)

## 2021-05-07 ENCOUNTER — Encounter: Payer: Self-pay | Admitting: Internal Medicine

## 2021-05-07 LAB — THYROGLOBULIN LEVEL: Thyroglobulin: 0.1 ng/mL — ABNORMAL LOW

## 2021-05-07 LAB — THYROGLOBULIN ANTIBODY: Thyroglobulin Ab: <1 IU/mL (ref <=1)

## 2021-05-07 MED ORDER — LEVOTHYROXINE SODIUM 112 MCG PO TABS: 112.0000 ug | ORAL_TABLET | Freq: Every day | ORAL | 3 refills | Status: DC

## 2021-05-11 ENCOUNTER — Ambulatory Visit
Admission: RE | Admit: 2021-05-11 | Discharge: 2021-05-11 | Disposition: A | Discharge status: Home / Self Care | Payer: Medicare Other | Source: Ambulatory Visit | Attending: Family Medicine | Admitting: Family Medicine

## 2021-05-11 DIAGNOSIS — M4802 Spinal stenosis, cervical region: Secondary | ICD-10-CM | POA: Diagnosis not present

## 2021-05-11 DIAGNOSIS — M5412 Radiculopathy, cervical region: Secondary | ICD-10-CM

## 2021-05-11 DIAGNOSIS — M4312 Spondylolisthesis, cervical region: Secondary | ICD-10-CM | POA: Diagnosis not present

## 2021-05-11 DIAGNOSIS — M2578 Osteophyte, vertebrae: Secondary | ICD-10-CM | POA: Diagnosis not present

## 2021-05-19 ENCOUNTER — Ambulatory Visit
Admission: RE | Admit: 2021-05-19 | Discharge: 2021-05-19 | Disposition: A | Discharge status: Home / Self Care | Payer: Medicare Other | Source: Ambulatory Visit | Attending: Internal Medicine | Admitting: Internal Medicine

## 2021-05-19 DIAGNOSIS — C73 Malignant neoplasm of thyroid gland: Secondary | ICD-10-CM

## 2021-05-19 DIAGNOSIS — E890 Postprocedural hypothyroidism: Secondary | ICD-10-CM | POA: Diagnosis not present

## 2021-05-21 ENCOUNTER — Telehealth: Payer: Self-pay | Admitting: Internal Medicine

## 2021-05-21 DIAGNOSIS — C73 Malignant neoplasm of thyroid gland: Secondary | ICD-10-CM

## 2021-05-21 NOTE — Telephone Encounter (Signed)
Thyroid bed ultrasound has shown a new 0.7 cm left thyroid bed nodule, stable 0.8 cm right thyroid bed nodule ? ? ?Despite a low thyroglobulin level, I have recommended proceeding with Thyrogen whole-body scan ? ? ?Patient in agreement ?

## 2021-05-28 DIAGNOSIS — M5412 Radiculopathy, cervical region: Secondary | ICD-10-CM | POA: Diagnosis not present

## 2021-06-01 ENCOUNTER — Ambulatory Visit
Admission: RE | Admit: 2021-06-01 | Discharge: 2021-06-01 | Disposition: A | Discharge status: Home / Self Care | Payer: Medicare Other | Source: Ambulatory Visit | Attending: Oncology | Admitting: Oncology

## 2021-06-01 DIAGNOSIS — E890 Postprocedural hypothyroidism: Secondary | ICD-10-CM

## 2021-06-01 DIAGNOSIS — C50412 Malignant neoplasm of upper-outer quadrant of left female breast: Secondary | ICD-10-CM

## 2021-06-01 DIAGNOSIS — C73 Malignant neoplasm of thyroid gland: Secondary | ICD-10-CM

## 2021-06-10 DIAGNOSIS — M5412 Radiculopathy, cervical region: Secondary | ICD-10-CM | POA: Diagnosis not present

## 2021-06-21 ENCOUNTER — Encounter: Payer: Self-pay | Admitting: Oncology

## 2021-06-22 NOTE — Written Directive (Addendum)
MOLECULAR IMAGING AND THERAPEUTICS WRITTEN DIRECTIVE ? ? ?PATIENT NAME: Madison Hopkins ? ?PT DOB:   April 24, 1955 ?                                             ?MRN: [id number] ? ?--------------------------------------------------------------------------------------------------------------------- ? ?I-131 WHOLE BODY SCAN ? ? ? ?RADIOPHARMACEUTICAL: Iodine-131 Capsule for Diagnostic Imaging ? ? ?PRESCRIBED DOSE FOR ADMINISTRATION: 4 mCi ? ? ?ROUTE OFADMINISTRATION: PO ? ? ?DIAGNOSIS:  Thyroid cancer ? ? ?REFERRING PHYSICIAN: Shamleffer ? ?  ?THYROGEN STIMULATION OR HORMONE WITHDRAW:  With Thyrogen ? ? ?DATE OF THYROIDECTOMY:  July 2021 ? ? ?SURGEON:  Gerkin ? ? ?TSH:   ?Lab Results  ?Component Value Date  ? TSH 0.23 (L) 05/06/2021  ? TSH 0.02 (L) 10/13/2020  ? TSH 0.03 (L) 04/07/2020  ? ? ? ?PRIOR I-131 THERAPY (Date and Dose):  No previous RAI or WB Scans ? ? ?ADDITIONAL PHYSICIAN COMMENTS/NOTES ? ? ?AUTHORIZED USER SIGNATURE & TIME STAMP: ?Rennis Golden, MD   06/23/21    11:23 AM   ? ?

## 2021-06-30 ENCOUNTER — Telehealth: Payer: Self-pay | Admitting: Physician Assistant

## 2021-06-30 NOTE — Telephone Encounter (Signed)
Copied from [place] [id number]. Topic: Medicare AWV ?>> Jun 30, 2021  9:37 AM Harris-Coley, Hannah Beat wrote: ?Reason for CRM: Left message for patient to schedule Annual Wellness Visit.  Please schedule with Nurse Health Advisor Charlott Rakes, RN at [facility].  Please call [phone] ask for Juliann Pulse ?

## 2021-07-02 ENCOUNTER — Ambulatory Visit (INDEPENDENT_AMBULATORY_CARE_PROVIDER_SITE_OTHER): Payer: Medicare Other

## 2021-07-02 ENCOUNTER — Other Ambulatory Visit: Payer: Self-pay | Admitting: Internal Medicine

## 2021-07-02 DIAGNOSIS — Z0000 Encounter for general adult medical examination without abnormal findings: Secondary | ICD-10-CM | POA: Diagnosis not present

## 2021-07-02 DIAGNOSIS — M5412 Radiculopathy, cervical region: Secondary | ICD-10-CM | POA: Diagnosis not present

## 2021-07-02 DIAGNOSIS — C73 Malignant neoplasm of thyroid gland: Secondary | ICD-10-CM

## 2021-07-02 NOTE — Progress Notes (Signed)
Virtual Visit via Telephone Note ? ?I connected with  Madison Hopkins on 07/02/21 at  1:30 PM EDT by telephone and verified that I am speaking with the correct person using two identifiers. ? ?Medicare Annual Wellness visit completed telephonically due to Covid-19 pandemic.  ? ?Persons participating in this call: This Health Coach and this patient.  ? ?Location: ?Patient: Home ?Provider: office  ?  ?I discussed the limitations, risks, security and privacy concerns of performing an evaluation and management service by telephone and the availability of in person appointments. The patient expressed understanding and agreed to proceed. ? ?Unable to perform video visit due to video visit attempted and failed and/or patient does not have video capability.  ? ?Some vital signs may be absent or patient reported.  ? ?Willette Brace, LPN ? ? ?Subjective:  ? Madison Hopkins is a 66 y.o. female who presents for an Initial Medicare Annual Wellness Visit. ? ?Review of Systems    ? ?Cardiac Risk Factors include: advanced age (>71mn, >>33women) ? ?   ?Objective:  ?  ?There were no vitals filed for this visit. ?There is no height or weight on file to calculate BMI. ? ? ?  07/02/2021  ?  1:30 PM 04/13/2020  ?  3:21 PM 09/20/2019  ? 12:00 PM 09/10/2019  ?  1:04 PM 08/28/2018  ?  7:00 AM 02/13/2018  ?  9:22 AM 02/08/2018  ?  1:08 PM  ?Advanced Directives  ?Does Patient Have a Medical Advance Directive? No No Yes Yes Yes No No  ?Type of AScientist, physiologicalof ADe KalbLiving will HKettle RiverLiving will Living will    ?Does patient want to make changes to medical advance directive?   No - Patient declined No - Patient declined No - Patient declined    ?Copy of HMulinoin Chart?    Yes - validated most recent copy scanned in chart (See row information)     ?Would patient like information on creating a medical advance directive? Yes (MAU/Ambulatory/Procedural Areas - Information given)  Yes (MAU/Ambulatory/Procedural Areas - Information given)    No - Patient declined No - Patient declined  ? ? ?Current Medications (verified) ?Outpatient Encounter Medications as of 07/02/2021  ?Medication Sig  ? anastrozole (ARIMIDEX) 1 MG tablet Take 1 tablet (1 mg total) by mouth daily.  ? Artificial Tear Solution (OPTI-TEARS OP) Place 1 drop into both eyes 3 (three) times daily as needed (for dry/irritated eyes.).  ? Ascorbic Acid (VITAMIN C) 100 MG CHEW Chew 100 mg by mouth daily.   ? BORIC ACID EX Apply topically.   ? Cholecalciferol 25 MCG (1000 UT) tablet Take by mouth.  ? fluticasone (FLONASE) 50 MCG/ACT nasal spray SPRAY 2 SPRAYS INTO EACH NOSTRIL EVERY DAY  ? ibuprofen (ADVIL) 800 MG tablet Take 800 mg by mouth 3 (three) times daily as needed.  ? levothyroxine (SYNTHROID) 112 MCG tablet Take 1 tablet (112 mcg total) by mouth daily.  ? lidocaine (XYLOCAINE) 5 % ointment Apply 1 application topically 3 (three) times daily. Uses as needed.  ? Multiple Vitamins-Minerals (VITAMIN D3 COMPLETE PO) Take 1 tablet by mouth daily.   ? naproxen sodium (ALEVE) 220 MG tablet Take 220 mg by mouth 2 (two) times daily as needed (FOR PAIN.).  ? NON FORMULARY Mucinex ?Flonase ?Alka seltzer cold  ? Probiotic Product (ALIGN PO) Take by mouth.   ? trimethoprim-polymyxin b (POLYTRIM) ophthalmic solution Place 1 drop into  the left eye every 4 (four) hours.  ? valACYclovir (VALTREX) 500 MG tablet Take 1 tablet (500 mg total) by mouth daily. Take one tablet (500 mg) twice a day for three days as needed.  ? [DISCONTINUED] prochlorperazine (COMPAZINE) 10 MG tablet Take 1 tablet (10 mg total) by mouth every 6 (six) hours as needed (Nausea or vomiting).  ? ?No facility-administered encounter medications on file as of 07/02/2021.  ? ? ?Allergies (verified) ?Penicillins, Bee venom, and Tramadol  ? ?History: ?Past Medical History:  ?Diagnosis Date  ? Abnormal Pap smear of vagina 07/28/2016  ? LGSIL; colpo 07/2016 atrophic squamous cells;  colpo 07/2017 - atypia  ? Allergy   ? Arthritis   ? Breast cancer ([place]) 2018  ? Metastatic Left breast  ? Elevated hemoglobin A1c 07/28/2016  ? level - 6.1  ? Endometriosis   ? GERD (gastroesophageal reflux disease)   ? HSV-2 infection   ? Iron deficiency anemia   ? Low vitamin D level 07/28/2016  ? level 24.7  ? Personal history of chemotherapy   ? finished 10/19  ? Personal history of radiation therapy   ? finished 03/2018  ? Thyroid cancer ([place])   ? Thyroid neoplasm   ? ?Past Surgical History:  ?Procedure Laterality Date  ? ABDOMINAL HYSTERECTOMY    ? BREAST BIOPSY Left 08/09/2017  ? BREAST LUMPECTOMY Left 01/22/2018  ? BREAST LUMPECTOMY WITH RADIOACTIVE SEED AND AXILLARY LYMPH NODE DISSECTION Left 01/22/2018  ? Procedure: LEFT BREAST LUMPECTOMY WITH RADIOACTIVE SEED AND COMPLETE LEFT AXILLARY LYMPH NODE DISSECTION;  Surgeon: [name], MD;  Location: [place];  Service: General;  Laterality: Left;  ? CESAREAN SECTION    ? COLON SURGERY    ? COLOSTOMY    ? COLOSTOMY REVERSAL    ? FOOT SURGERY Right   ? done spur  ? OOPHORECTOMY    ? PORT-A-CATH REMOVAL Right 08/28/2018  ? Procedure: REMOVAL PORT-A-CATH;  Surgeon: [name], MD;  Location: [place];  Service: General;  Laterality: Right;  ? PORTACATH PLACEMENT N/A 09/06/2017  ? Procedure: INSERTION PORT-A-CATH;  Surgeon: [name], MD;  Location: [place];  Service: General;  Laterality: N/A;  ? SMALL INTESTINE SURGERY    ? SPINE SURGERY    ? Injections  ? THYROIDECTOMY N/A 09/20/2019  ? Procedure: TOTAL THYROIDECTOMY;  Surgeon: [name], MD;  [facility];  Service: General;  Laterality: N/A;  ? Golden  ? ?Family History  ?Problem Relation Age of Onset  ? Mental illness Mother   ? Alcoholism Mother   ? Cirrhosis Mother   ? Alcohol abuse Mother   ? Cancer Father   ? Lung cancer Father   ? ?Social History  ? ?Socioeconomic History  ? Marital status: Single  ?  Spouse name: Not on file  ? Number of  children: 2  ? Years of education: Not on file  ? Highest education level: Not on file  ?Occupational History  ? Not on file  ?Tobacco Use  ? Smoking status: Never  ? Smokeless tobacco: Never  ?Vaping Use  ? Vaping Use: Never used  ?Substance and Sexual Activity  ? Alcohol use: Yes  ?  Alcohol/week: 1.0 standard drink  ?  Types: 1 Glasses of wine per week  ?  Comment: Social  ? Drug use: Not Currently  ?  Types: Marijuana  ?  Comment: everyday  ? Sexual activity: Not Currently  ?  Birth control/protection: Post-menopausal, Surgical  ?  Comment: Hysterectomy  ?Other Topics Concern  ? Not on file  ?Social History Narrative  ? Retired from [employer]  ? [employer] -- retiring next April  ? Has children   ? ?Social Determinants of Health  ? ?Financial Resource Strain: Low Risk   ? Difficulty of Paying Living Expenses: Not hard at all  ?Food Insecurity: No Food Insecurity  ? Worried About Charity fundraiser in the Last Year: Never true  ? Ran Out of Food in the Last Year: Never true  ?Transportation Needs: No Transportation Needs  ? Lack of Transportation (Medical): No  ? Lack of Transportation (Non-Medical): No  ?Physical Activity: Inactive  ? Days of Exercise per Week: 0 days  ? Minutes of Exercise per Session: 0 min  ?Stress: No Stress Concern Present  ? Feeling of Stress : Not at all  ?Social Connections: Moderately Integrated  ? Frequency of Communication with Friends and Family: More than three times a week  ? Frequency of Social Gatherings with Friends and Family: More than three times a week  ? Attends Religious Services: More than 4 times per year  ? Active Member of Clubs or Organizations: Yes  ? Attends Archivist Meetings: 1 to 4 times per year  ? Marital Status: Never married  ? ? ?Tobacco Counseling ?Counseling given: Not Answered ? ? ?Clinical Intake: ? ?Pre-visit preparation completed: Yes ? ?Pain : No/denies pain ? ?  ? ?BMI - recorded: 29.47 ?Nutritional Status: BMI 25 -29  Overweight ?Diabetes: No ? ?How often do you need to have someone help you when you read instructions, pamphlets, or other written materials from your doctor or pharmacy?: 1 - Never ? ?Diabetic?no ? ?Interpreter Needed?: No ?

## 2021-07-02 NOTE — Patient Instructions (Signed)
Ms. Piercey , ?Thank you for taking time to come for your Medicare Wellness Visit. I appreciate your ongoing commitment to your health goals. Please review the following plan we discussed and let me know if I can assist you in the future.  ? ?Screening recommendations/referrals: ?Colonoscopy: Cologuard 01/21/20 repeat every 3 years  ?Mammogram: Done 01/27/21 repeat every year  ?Bone Density: Done 11/28/19 repeat every 2 years  ?Recommended yearly ophthalmology/optometry visit for glaucoma screening and checkup ?Recommended yearly dental visit for hygiene and checkup ? ?Vaccinations: ?Influenza vaccine: Due and discussed  ?Pneumococcal vaccine: Due  ?Tdap vaccine: Done 10/23/18 repeat every 10 years  ?Shingles vaccine: Shingrix discussed. Please contact your pharmacy for coverage information.    ?Covid-19:Completed 5/3, 5/24, 02/16/20 & 10/22/20 ? ?Advanced directives: Copies in chart  ? ?Conditions/risks identified: Lose weight  ? ?Next appointment: Follow up in one year for your annual wellness visit  ? ? ?Preventive Care 66 Years and Older, Female ?Preventive care refers to lifestyle choices and visits with your health care provider that can promote health and wellness. ?What does preventive care include? ?A yearly physical exam. This is also called an annual well check. ?Dental exams once or twice a year. ?Routine eye exams. Ask your health care provider how often you should have your eyes checked. ?Personal lifestyle choices, including: ?Daily care of your teeth and gums. ?Regular physical activity. ?Eating a healthy diet. ?Avoiding tobacco and drug use. ?Limiting alcohol use. ?Practicing safe sex. ?Taking low-dose aspirin every day. ?Taking vitamin and mineral supplements as recommended by your health care provider. ?What happens during an annual well check? ?The services and screenings done by your health care provider during your annual well check will depend on your age, overall health, lifestyle risk factors,  and family history of disease. ?Counseling  ?Your health care provider may ask you questions about your: ?Alcohol use. ?Tobacco use. ?Drug use. ?Emotional well-being. ?Home and relationship well-being. ?Sexual activity. ?Eating habits. ?History of falls. ?Memory and ability to understand (cognition). ?Work and work Statistician. ?Reproductive health. ?Screening  ?You may have the following tests or measurements: ?Height, weight, and BMI. ?Blood pressure. ?Lipid and cholesterol levels. These may be checked every 5 years, or more frequently if you are over 64 years old. ?Skin check. ?Lung cancer screening. You may have this screening every year starting at age 54 if you have a 30-pack-year history of smoking and currently smoke or have quit within the past 15 years. ?Fecal occult blood test (FOBT) of the stool. You may have this test every year starting at age 69. ?Flexible sigmoidoscopy or colonoscopy. You may have a sigmoidoscopy every 5 years or a colonoscopy every 10 years starting at age 37. ?Hepatitis C blood test. ?Hepatitis B blood test. ?Sexually transmitted disease (STD) testing. ?Diabetes screening. This is done by checking your blood sugar (glucose) after you have not eaten for a while (fasting). You may have this done every 1-3 years. ?Bone density scan. This is done to screen for osteoporosis. You may have this done starting at age 2. ?Mammogram. This may be done every 1-2 years. Talk to your health care provider about how often you should have regular mammograms. ?Talk with your health care provider about your test results, treatment options, and if necessary, the need for more tests. ?Vaccines  ?Your health care provider may recommend certain vaccines, such as: ?Influenza vaccine. This is recommended every year. ?Tetanus, diphtheria, and acellular pertussis (Tdap, Td) vaccine. You may need a Td booster every  10 years. ?Zoster vaccine. You may need this after age 14. ?Pneumococcal 13-valent conjugate  (PCV13) vaccine. One dose is recommended after age 61. ?Pneumococcal polysaccharide (PPSV23) vaccine. One dose is recommended after age 13. ?Talk to your health care provider about which screenings and vaccines you need and how often you need them. ?This information is not intended to replace advice given to you by your health care provider. Make sure you discuss any questions you have with your health care provider. ?Document Released: 03/13/2015 Document Revised: 11/04/2015 Document Reviewed: 12/16/2014 ?Elsevier Interactive Patient Education ? 2017 [place]. ? ?Fall Prevention in the Home ?Falls can cause injuries. They can happen to people of all ages. There are many things you can do to make your home safe and to help prevent falls. ?What can I do on the outside of my home? ?Regularly fix the edges of walkways and driveways and fix any cracks. ?Remove anything that might make you trip as you walk through a door, such as a raised step or threshold. ?Trim any bushes or trees on the path to your home. ?Use bright outdoor lighting. ?Clear any walking paths of anything that might make someone trip, such as rocks or tools. ?Regularly check to see if handrails are loose or broken. Make sure that both sides of any steps have handrails. ?Any raised decks and porches should have guardrails on the edges. ?Have any leaves, snow, or ice cleared regularly. ?Use sand or salt on walking paths during winter. ?Clean up any spills in your garage right away. This includes oil or grease spills. ?What can I do in the bathroom? ?Use night lights. ?Install grab bars by the toilet and in the tub and shower. Do not use towel bars as grab bars. ?Use non-skid mats or decals in the tub or shower. ?If you need to sit down in the shower, use a plastic, non-slip stool. ?Keep the floor dry. Clean up any water that spills on the floor as soon as it happens. ?Remove soap buildup in the tub or shower regularly. ?Attach bath mats securely with  double-sided non-slip rug tape. ?Do not have throw rugs and other things on the floor that can make you trip. ?What can I do in the bedroom? ?Use night lights. ?Make sure that you have a light by your bed that is easy to reach. ?Do not use any sheets or blankets that are too big for your bed. They should not hang down onto the floor. ?Have a firm chair that has side arms. You can use this for support while you get dressed. ?Do not have throw rugs and other things on the floor that can make you trip. ?What can I do in the kitchen? ?Clean up any spills right away. ?Avoid walking on wet floors. ?Keep items that you use a lot in easy-to-reach places. ?If you need to reach something above you, use a strong step stool that has a grab bar. ?Keep electrical cords out of the way. ?Do not use floor polish or wax that makes floors slippery. If you must use wax, use non-skid floor wax. ?Do not have throw rugs and other things on the floor that can make you trip. ?What can I do with my stairs? ?Do not leave any items on the stairs. ?Make sure that there are handrails on both sides of the stairs and use them. Fix handrails that are broken or loose. Make sure that handrails are as long as the stairways. ?Check any carpeting to make  sure that it is firmly attached to the stairs. Fix any carpet that is loose or worn. ?Avoid having throw rugs at the top or bottom of the stairs. If you do have throw rugs, attach them to the floor with carpet tape. ?Make sure that you have a light switch at the top of the stairs and the bottom of the stairs. If you do not have them, ask someone to add them for you. ?What else can I do to help prevent falls? ?Wear shoes that: ?Do not have high heels. ?Have rubber bottoms. ?Are comfortable and fit you well. ?Are closed at the toe. Do not wear sandals. ?If you use a stepladder: ?Make sure that it is fully opened. Do not climb a closed stepladder. ?Make sure that both sides of the stepladder are locked  into place. ?Ask someone to hold it for you, if possible. ?Clearly mark and make sure that you can see: ?Any grab bars or handrails. ?First and last steps. ?Where the edge of each step is. ?Use tools that

## 2021-07-06 ENCOUNTER — Other Ambulatory Visit (INDEPENDENT_AMBULATORY_CARE_PROVIDER_SITE_OTHER): Payer: Medicare Other

## 2021-07-06 DIAGNOSIS — C73 Malignant neoplasm of thyroid gland: Secondary | ICD-10-CM | POA: Diagnosis not present

## 2021-07-06 LAB — TSH: TSH: 0.14 u[IU]/mL — ABNORMAL LOW (ref 0.35–5.50)

## 2021-07-12 ENCOUNTER — Encounter (HOSPITAL_COMMUNITY)
Admission: RE | Admit: 2021-07-12 | Discharge: 2021-07-12 | Disposition: A | Discharge status: Home / Self Care | Payer: Medicare Other | Source: Ambulatory Visit | Attending: Internal Medicine | Admitting: Internal Medicine

## 2021-07-12 DIAGNOSIS — C73 Malignant neoplasm of thyroid gland: Secondary | ICD-10-CM | POA: Diagnosis not present

## 2021-07-12 MED ORDER — THYROTROPIN ALFA 0.9 MG IM SOLR
INTRAMUSCULAR | Status: AC
  Filled 2021-07-12: qty 0.9

## 2021-07-12 MED ORDER — THYROTROPIN ALFA 0.9 MG IM SOLR
0.9000 mg | INTRAMUSCULAR | Status: AC
  Administered 2021-07-12: 0.9 mg via INTRAMUSCULAR

## 2021-07-13 ENCOUNTER — Encounter (HOSPITAL_COMMUNITY)
Admission: RE | Admit: 2021-07-13 | Discharge: 2021-07-13 | Disposition: A | Discharge status: Home / Self Care | Payer: Medicare Other | Source: Ambulatory Visit | Attending: Internal Medicine | Admitting: Internal Medicine

## 2021-07-13 DIAGNOSIS — C73 Malignant neoplasm of thyroid gland: Secondary | ICD-10-CM | POA: Diagnosis not present

## 2021-07-13 MED ORDER — THYROTROPIN ALFA 0.9 MG IM SOLR
INTRAMUSCULAR | Status: AC
  Filled 2021-07-13: qty 0.9

## 2021-07-13 MED ORDER — THYROTROPIN ALFA 0.9 MG IM SOLR
0.9000 mg | INTRAMUSCULAR | Status: AC
  Administered 2021-07-13: 0.9 mg via INTRAMUSCULAR

## 2021-07-14 ENCOUNTER — Encounter (HOSPITAL_COMMUNITY)
Admission: RE | Admit: 2021-07-14 | Discharge: 2021-07-14 | Disposition: A | Discharge status: Home / Self Care | Payer: Medicare Other | Source: Ambulatory Visit | Attending: Internal Medicine | Admitting: Internal Medicine

## 2021-07-14 DIAGNOSIS — C73 Malignant neoplasm of thyroid gland: Secondary | ICD-10-CM | POA: Diagnosis not present

## 2021-07-14 MED ORDER — SODIUM IODIDE I 131 CAPSULE
3.9000 | Freq: Once | INTRAVENOUS | Status: AC
  Administered 2021-07-14: 3.9 via ORAL

## 2021-07-16 ENCOUNTER — Telehealth: Payer: Self-pay | Admitting: Internal Medicine

## 2021-07-16 ENCOUNTER — Encounter: Payer: Self-pay | Admitting: Internal Medicine

## 2021-07-16 ENCOUNTER — Encounter (HOSPITAL_COMMUNITY)
Admission: RE | Admit: 2021-07-16 | Discharge: 2021-07-16 | Disposition: A | Discharge status: Home / Self Care | Payer: Medicare Other | Source: Ambulatory Visit | Attending: Internal Medicine | Admitting: Internal Medicine

## 2021-07-16 DIAGNOSIS — C73 Malignant neoplasm of thyroid gland: Secondary | ICD-10-CM | POA: Diagnosis not present

## 2021-07-16 DIAGNOSIS — R9389 Abnormal findings on diagnostic imaging of other specified body structures: Secondary | ICD-10-CM | POA: Diagnosis not present

## 2021-07-16 NOTE — Telephone Encounter (Signed)
I have discussed the whole-body scan with the patient on 07/16/2021   Patient in agreement to proceed with radioactive iodine remnant ablation    A portal message was sent with low iodine diet to be started 2 weeks prior to radioactive iodine remnant ablation   Patient expressed understanding and all questions have been answered   Abby Nena Jordan, MD  [facility] - In[facility] Endocrin[facility]  [facility] [address] [place], [place] [address] Phone: [phone] FAX: [phone]

## 2021-07-27 NOTE — Written Directive (Addendum)
MOLECULAR IMAGING AND THERAPEUTICS WRITTEN DIRECTIVE   PATIENT NAME: Madison Hopkins  PT DOB:   10-11-1955                                              MRN: [id number]  ---------------------------------------------------------------------------------------------------------------------   I-131 THYROID CANCER THERAPY   RADIOPHARMACEUTICAL:  Iodine-131 Capsule    PRESCRIBED DOSE FOR ADMINISTRATION: 50 mCi   ROUTE OFADMINISTRATION:  PO   DIAGNOSIS: Papillary Thyroid Cancer   REFERRING PHYSICIAN: Dr. Kelton Pillar @ [place] WITHDRAW: with Thyrogen   REMNANT ABLATION OR ADJUVANT THERAPY:  Remnant Ablation   DATE OF THYROIDECTOMY: 08/2019   SURGEON: Dr. Harlow Asa   TSH:   Lab Results  Component Value Date   TSH 0.14 (L) 07/06/2021   TSH 0.23 (L) 05/06/2021   TSH 0.02 (L) 10/13/2020     PRIOR I-131 THERAPY (Date and Dose):   Pathology:  Cell type: _0   Papillary  <LTJQZESPQZRAQTMA>_2<\/QJFHLKTGYBWLSLHT>_3   Follicular  <SKAJGOTLXBWIOMBT>_5<\/HRCBULAGTXMIWOEH>_2   [place]   Largest tumor focus:     1.3 cm  Extrathyroidal Extension?     Yes _3   No _4     Lymphovascular Invasion?  Yes  _5   No  _6     Margins positive ? Yes _7   No _8     Lymph nodes positive? Yes _9   No  _10       # positive nodes:  0 # negative nodes:  0   TNM staging: pT:   1b      PN:    x     Mx:    ADDITIONAL PHYSICIAN COMMENTS/NOTES: 66 yo female with low rish PTC.  Asymmetric uptake in right neck on I 131 diagnostic scan 07/16/21.   Low thyroglobulin.  Remnant ablation.    AUTHORIZED USER SIGNATURE & TIME STAMP: Rennis Golden, MD   08/02/21    8:30 AM

## 2021-08-17 ENCOUNTER — Encounter (HOSPITAL_COMMUNITY)
Admission: RE | Admit: 2021-08-17 | Discharge: 2021-08-17 | Disposition: A | Discharge status: Home / Self Care | Payer: Medicare Other | Source: Ambulatory Visit | Attending: Internal Medicine | Admitting: Internal Medicine

## 2021-08-17 DIAGNOSIS — C73 Malignant neoplasm of thyroid gland: Secondary | ICD-10-CM | POA: Diagnosis not present

## 2021-08-17 MED ORDER — THYROTROPIN ALFA 0.9 MG IM SOLR: 0.9000 mg | INTRAMUSCULAR | Status: AC

## 2021-08-17 MED ORDER — THYROTROPIN ALFA 0.9 MG IM SOLR
INTRAMUSCULAR | Status: AC
  Administered 2021-08-17: 0.9 mg via INTRAMUSCULAR
  Filled 2021-08-17: qty 0.9

## 2021-08-18 ENCOUNTER — Encounter (HOSPITAL_COMMUNITY)
Admission: RE | Admit: 2021-08-18 | Discharge: 2021-08-18 | Disposition: A | Discharge status: Home / Self Care | Payer: Medicare Other | Source: Ambulatory Visit | Attending: Internal Medicine | Admitting: Internal Medicine

## 2021-08-18 DIAGNOSIS — C73 Malignant neoplasm of thyroid gland: Secondary | ICD-10-CM | POA: Diagnosis not present

## 2021-08-18 MED ORDER — THYROTROPIN ALFA 0.9 MG IM SOLR
0.9000 mg | INTRAMUSCULAR | Status: AC
  Administered 2021-08-18: 0.9 mg via INTRAMUSCULAR

## 2021-08-18 MED ORDER — THYROTROPIN ALFA 0.9 MG IM SOLR
INTRAMUSCULAR | Status: AC
  Filled 2021-08-18: qty 0.9

## 2021-08-19 ENCOUNTER — Encounter (HOSPITAL_COMMUNITY)
Admission: RE | Admit: 2021-08-19 | Discharge: 2021-08-19 | Disposition: A | Discharge status: Home / Self Care | Payer: Medicare Other | Source: Ambulatory Visit | Attending: Internal Medicine | Admitting: Internal Medicine

## 2021-08-19 DIAGNOSIS — C73 Malignant neoplasm of thyroid gland: Secondary | ICD-10-CM | POA: Diagnosis not present

## 2021-08-19 MED ORDER — SODIUM IODIDE I 131 CAPSULE
53.4000 | Freq: Once | INTRAVENOUS | Status: AC
  Administered 2021-08-19: 53.4 via ORAL

## 2021-08-27 ENCOUNTER — Encounter (HOSPITAL_COMMUNITY)
Admission: RE | Admit: 2021-08-27 | Discharge: 2021-08-27 | Disposition: A | Discharge status: Home / Self Care | Payer: Medicare Other | Source: Ambulatory Visit | Attending: Internal Medicine | Admitting: Internal Medicine

## 2021-08-27 DIAGNOSIS — C73 Malignant neoplasm of thyroid gland: Secondary | ICD-10-CM | POA: Diagnosis not present

## 2021-08-30 ENCOUNTER — Telehealth: Payer: Self-pay

## 2021-08-30 NOTE — Telephone Encounter (Signed)
Patient would like to speak with you regarding imaging results. Patient doesn't fully understand. Patient is aware that you are out of office.

## 2021-09-01 NOTE — Telephone Encounter (Signed)
Spoke to the patient on 09/01/2021 at 10:45 AM   She is status post thyroid remnant ablation with 53 mCi 07/2021  Posttreatment scan showed right thyroid remnant but no distant metastasis     Abby Nena Jordan, MD  [facility] Endocrinology  [facility] [address] [place], [place] [address] Phone: [phone] FAX: [phone]

## 2021-09-20 ENCOUNTER — Other Ambulatory Visit: Payer: Self-pay | Admitting: Hematology and Oncology

## 2021-09-20 DIAGNOSIS — Z1231 Encounter for screening mammogram for malignant neoplasm of breast: Secondary | ICD-10-CM

## 2021-11-11 ENCOUNTER — Ambulatory Visit: Payer: Medicare Other | Admitting: Internal Medicine

## 2021-11-11 ENCOUNTER — Encounter: Payer: Self-pay | Admitting: Internal Medicine

## 2021-11-11 VITALS — BP 120/70 | HR 74 | Ht 68.9 in | Wt 188.0 lb

## 2021-11-11 DIAGNOSIS — E890 Postprocedural hypothyroidism: Secondary | ICD-10-CM

## 2021-11-11 DIAGNOSIS — C73 Malignant neoplasm of thyroid gland: Secondary | ICD-10-CM

## 2021-11-11 LAB — TSH: TSH: 0.05 u[IU]/mL — ABNORMAL LOW (ref 0.35–5.50)

## 2021-11-11 MED ORDER — LEVOTHYROXINE SODIUM 100 MCG PO TABS: 100.0000 ug | ORAL_TABLET | Freq: Every day | ORAL | 3 refills | Status: DC

## 2021-11-11 NOTE — Progress Notes (Signed)
Name: Madison Hopkins  MRN/ DOB: [id number], [birth year]    Age/ Sex: 66 y.o., female     PCP: Jarold Motto, PA   Reason for Endocrinology Evaluation: [facility]      Initial Endocrinology Clinic Visit: 10/02/2019    PATIENT IDENTIFIER: Madison Hopkins is a 66 y.o., female with a past medical history of Breast Ca and PTC . She has followed with [facility] since 10/02/2019 for consultative assistance with management of her PTC.   HISTORICAL SUMMARY:   Pt has been referred by Dr. Darnell Level.  She had an incidental finding of a left thyroid nodule ~1.5 cm in diameter on neck MRI during evaluation of chronic neck pain.  This nodule was noted to enlarge to 2.3 cm max diameter on cervical spine MRI 04/2019.   She is S/P FNA in 06/2019 - with left mid pole cytology report of Suspicious for malignancy (Bethesda category V), no afirma found for this in the chart. And a left inferior pole cytology of Atypia of undetermined significance (Bethesda category III) , with benign afirma     She is S/P Total thyroidectomy  On 7/23rd/ 2021 with a 1.3 cm papillary thyroid carcinoma on the left with no invasion, free margins and NO L.N submitted.   Thyroid ultrasound showed a subcentimeter hypoechoic right thyroid bed focus 0.8 cm by by March 2023 the patient developed a new 0.7 left thyroid bed focus    She is s/p RAI remnant ablation with 53 mCi 07/2021   Posttreatment scan showed right thyroid remnant but no distant metastasis on 08/27/2021   Thyroglobulin remains low at 0.1 NG/mL with undetectable thyroglobulin antibodies  SUBJECTIVE:    Today (11/11/2021):  Madison Hopkins is here for total thyroidectomy secondary to PTC.   Pt has been noted with weight loss  She has been exercising  Denies  diarrhea or constipation  Denies tremors  Denies palpitations  Denies local  neck swelling    Levothyroxine 112 mcg daily    HISTORY:  Past Medical History:  Past Medical History:   Diagnosis Date   Abnormal Pap smear of vagina 07/28/2016   LGSIL; colpo 07/2016 atrophic squamous cells; colpo 07/2017 - atypia   Allergy    Arthritis    Breast cancer (HCC) 2018   Metastatic Left breast   Elevated hemoglobin A1c 07/28/2016   level - 6.1   Endometriosis    GERD (gastroesophageal reflux disease)    HSV-2 infection    Iron deficiency anemia    Low vitamin D level 07/28/2016   level 24.7   Personal history of chemotherapy    finished 10/19   Personal history of radiation therapy    finished 03/2018   Thyroid cancer (HCC)    Thyroid neoplasm    Past Surgical History:  Past Surgical History:  Procedure Laterality Date   ABDOMINAL HYSTERECTOMY     BREAST BIOPSY Left 08/09/2017   BREAST LUMPECTOMY Left 01/22/2018   BREAST LUMPECTOMY WITH RADIOACTIVE SEED AND AXILLARY LYMPH NODE DISSECTION Left 01/22/2018   Procedure: LEFT BREAST LUMPECTOMY WITH RADIOACTIVE SEED AND COMPLETE LEFT AXILLARY LYMPH NODE DISSECTION;  Surgeon: Claud Kelp, MD;  Location: [facility];  Service: General;  Laterality: Left;   CESAREAN SECTION     COLON SURGERY     COLOSTOMY     COLOSTOMY REVERSAL     FOOT SURGERY Right    done spur   OOPHORECTOMY     PORT-A-CATH REMOVAL Right 08/28/2018  Procedure: REMOVAL PORT-A-CATH;  Surgeon: [name], MD;  Location: [place];  Service: General;  Laterality: Right;   PORTACATH PLACEMENT N/A 09/06/2017   Procedure: INSERTION PORT-A-CATH;  Surgeon: [name], MD;  Location: [place];  Service: General;  Laterality: N/A;   SMALL INTESTINE SURGERY     SPINE SURGERY     Injections   THYROIDECTOMY N/A 09/20/2019   Procedure: TOTAL THYROIDECTOMY;  Surgeon: [name], MD;  Location: [facility];  Service: General;  Laterality: N/A;   TUBAL LIGATION  1995   Social History:  reports that she has never smoked. She has never used smokeless tobacco. She reports current alcohol use of about 1.0 standard drink of alcohol  per week. She reports that she does not currently use drugs after having used the following drugs: Marijuana. Family History:  Family History  Problem Relation Age of Onset   Mental illness Mother    Alcoholism Mother    Cirrhosis Mother    Alcohol abuse Mother    Cancer Father    Lung cancer Father      HOME MEDICATIONS: Allergies as of 11/11/2021       Reactions   Penicillins Nausea Only, Hives, Nausea And Vomiting   Has patient had a PCN reaction causing immediate rash, facial/tongue/throat swelling, SOB or lightheadedness with hypotension: No Has patient had a PCN reaction causing severe rash involving mucus membranes or skin necrosis: No Has patient had a PCN reaction that required hospitalization: No Has patient had a PCN reaction occurring within the last 10 years: Yes--nausea & headache ONLY If all of the above answers are "NO", then may proceed with Cephalosporin use. Has patient had a PCN reaction causing immediate rash, facial/tongue/throat swelling, SOB or lightheadedness with hypotension: No Has patient had a PCN reaction causing severe rash involving mucus membranes or skin necrosis: No Has patient had a PCN reaction that required hospitalization: No Has patient had a PCN reaction occurring within the last 10 years: Yes--nausea & headache ONLY If all of the above answers are "NO", then may proceed with Cephalosporin use. Has patient had a PCN reaction causing immediate rash, facial/tongue/throat swelling, SOB or lightheadedness with hypotension: No Has patient had a PCN reaction causing severe rash involving mucus membranes or skin necrosis: No Has patient had a PCN reaction that required hospitalization: No Has patient had a PCN reaction occurring within the last 10 years: Yes--nausea & headache ONLY If all of the above answers are "NO", then may proceed with Cephalosporin use.   Bee Venom    Tramadol Nausea Only        Medication List        Accurate as of  November 11, 2021 10:42 AM. If you have any questions, ask your nurse or doctor.          ALIGN PO Take by mouth.   anastrozole 1 MG tablet Commonly known as: ARIMIDEX Take 1 tablet (1 mg total) by mouth daily.   BORIC ACID EX Apply topically.   Cholecalciferol 25 MCG (1000 UT) tablet Take by mouth.   fluticasone 50 MCG/ACT nasal spray Commonly known as: FLONASE SPRAY 2 SPRAYS INTO EACH NOSTRIL EVERY DAY   ibuprofen 800 MG tablet Commonly known as: ADVIL Take 800 mg by mouth 3 (three) times daily as needed.   levothyroxine 112 MCG tablet Commonly known as: SYNTHROID Take 1 tablet (112 mcg total) by mouth daily.   lidocaine 5 % ointment Commonly known as: XYLOCAINE Apply 1 application  topically 3 (three) times daily. Uses as needed.   naproxen sodium 220 MG tablet Commonly known as: ALEVE Take 220 mg by mouth 2 (two) times daily as needed (FOR PAIN.).   NON FORMULARY Mucinex Flonase Alka seltzer cold   OPTI-TEARS OP Place 1 drop into both eyes 3 (three) times daily as needed (for dry/irritated eyes.).   trimethoprim-polymyxin b ophthalmic solution Commonly known as: Polytrim Place 1 drop into the left eye every 4 (four) hours.   valACYclovir 500 MG tablet Commonly known as: VALTREX Take 1 tablet (500 mg total) by mouth daily. Take one tablet (500 mg) twice a day for three days as needed.   Vitamin C 100 MG Chew Chew 100 mg by mouth daily.   VITAMIN D3 COMPLETE PO Take 1 tablet by mouth daily.          OBJECTIVE:   PHYSICAL EXAM: VS:BP 120/70 (BP Location: Left Arm, Patient Position: Sitting, Cuff Size: Small)   Pulse 74   Ht 5' 8.9" (1.75 m)   Wt 188 lb (85.3 kg)   LMP  (LMP Unknown)   SpO2 96%   BMI 27.84 kg/m    EXAM: General: Pt appears well and is in NAD  Neck: General: Supple without adenopathy. Thyroid: Surgically removed . No nodules appreciated.  Lungs: Clear with good BS bilat with no rales, rhonchi, or wheezes  Heart:  Auscultation: RRR.  Abdomen: Normoactive bowel sounds, soft, nontender, without masses or organomegaly palpable  Extremities:  BL LE: No pretibial edema normal ROM and strength.  Mental Status: Judgment, insight: Intact Orientation: Oriented to time, place, and person Mood and affect: No depression, anxiety, or agitation     DATA REVIEWED:  Latest Reference Range & Units 11/11/21 11:14  TSH 0.35 - 5.50 uIU/mL 0.05 (L)  (L): Data is abnormally low   Latest Reference Range & Units 05/06/21 13:37  Thyroglobulin ng/mL 0.1 (L)     Pathology report 09/20/2019 THYROID, TOTAL THYROIDECTOMY:  - Papillary thyroid carcinoma, 1.3 cm.  - Margins not involved.  - Multiple adenomatous nodules.  - See oncology table.   ONCOLOGY TABLE:  THYROID GLAND:  Procedure: Total thyroidectomy.  Tumor Focality: Unifocal.  Tumor Site: Left lobe, mid.  Tumor Size: 1.3 x 1 x 1 cm.  Histologic Type: Papillary thyroid carcinoma.  Margins: Free of tumor.  Angioinvasion: Not identified.  Lymphatic Invasion: Not identified.  Extrathyroidal extension: Not identified.  Regional Lymph Nodes: No lymph nodes submitted.  Pathologic Stage Classification (pTNM, AJCC 8th Edition): pT1b, pNX   Thyroid Ultrasound 05/19/2021  Status post total thyroidectomy. Evaluation of the right thyroid bed again demonstrates a small hypoechoic focus that measures up to 0.8 cm in maximum dimension, similar to previous exam.   There is a new ill-defined hypoechoic focus in the inferior left thyroid bed that measures 0.7 x 0.7 x 0.6 cm (see key image; image 122/149, series [place] #1 - 2).   Evaluation of the left thyroid bed demonstrates no unexpected soft tissue abnormality.   No pathologic lymph nodes identified in the adjacent soft tissues.   IMPRESSION: Status post total thyroidectomy. There is a new 0.7 cm soft tissue focus inferior right thyroid bed (see key image). Otherwise stable appearance of a previously described  0.8 cm soft tissue focus in the right thyroid bed. Findings remain indeterminate, but raise suspicion for residual/recurrent disease.       Post Rx WBS 08/27/2021  FINDINGS: Uptake at thyroid remnant greater on RIGHT.   No additional sites  of abnormal radio iodine accumulation identified.   Small amount of excreted tracer within urinary bladder.   IMPRESSION: Small thyroid remnant.   No scintigraphic evidence of iodine avid metastatic thyroid cancer.   ASSESSMENT / PLAN / RECOMMENDATIONS:   Hx of papillary Thyroid carcinoma (pT1b, pNX) Stage I:     - S/P total thyroidectomy 09/20/2019 - TSH goal 0.5-2.0 uIU/mL  -Her previous thyroid bed ultrasound shows a stable subcentimeter right thyroid focus, by March 2023 and new subcentimeter left thyroid bed focus has been noted , will seed with repeat thyroid bed ultrasound  - She is S/P remnant ablation 08/27/2021 - Pt with excellent biochemical response  -TG, TG AB pending    2. Post-operative Hypothyroidism      - She is clinically euthyroid  -TSH is below goal, will reduce levothyroxine dose as below   Medications   -Stop levothyroxine 112 mcg daily -Start levothyroxine 100 mcg daily     F/U in 6 months    Signed electronically by: Mack Guise, MD  [facility] End[facility]in[facility]  [facility] [address] [place], [place] [address] Phone: [phone] FAX: [phone]      CC: [name], [place] [address] Phone: [phone]  Fax: [phone]   Return to Endocrinology clinic as below: Future Appointments  Date Time Provider [place]  11/11/2021 10:50 AM Shilee Biggs, [name], MD [facility] None  11/30/2021  2:00 PM Nunzio Cobbs, MD [facility] None  12/22/2021 11:30 AM [facility] LAB [facility] None  12/22/2021 12:00 PM [place], Arletha Pili, MD [facility] None  01/28/2022 10:00 AM [facility] MM 2 [facility] GI-BREAST  CE  03/02/2022  1:00 PM [facility] DX DEXA 1 [facility] GI-BREAST CE  07/15/2022  1:30 PM [facility] HEALTH COACH [facility] PEC

## 2021-11-12 ENCOUNTER — Ambulatory Visit
Admission: RE | Admit: 2021-11-12 | Discharge: 2021-11-12 | Disposition: A | Discharge status: Home / Self Care | Payer: Medicare Other | Source: Ambulatory Visit | Attending: Internal Medicine | Admitting: Internal Medicine

## 2021-11-12 DIAGNOSIS — Z85850 Personal history of malignant neoplasm of thyroid: Secondary | ICD-10-CM | POA: Diagnosis not present

## 2021-11-12 DIAGNOSIS — C73 Malignant neoplasm of thyroid gland: Secondary | ICD-10-CM

## 2021-11-12 LAB — THYROGLOBULIN LEVEL: Thyroglobulin: 0.1 ng/mL — ABNORMAL LOW

## 2021-11-12 LAB — THYROGLOBULIN ANTIBODY: Thyroglobulin Ab: <1 IU/mL (ref <=1)

## 2021-11-18 ENCOUNTER — Ambulatory Visit (INDEPENDENT_AMBULATORY_CARE_PROVIDER_SITE_OTHER): Payer: Medicare Other | Admitting: Obstetrics and Gynecology

## 2021-11-18 ENCOUNTER — Other Ambulatory Visit (HOSPITAL_COMMUNITY)
Admission: RE | Admit: 2021-11-18 | Discharge: 2021-11-18 | Disposition: A | Discharge status: Home / Self Care | Payer: Medicare Other | Source: Ambulatory Visit | Attending: Obstetrics and Gynecology | Admitting: Obstetrics and Gynecology

## 2021-11-18 ENCOUNTER — Telehealth: Payer: Self-pay

## 2021-11-18 ENCOUNTER — Encounter: Payer: Self-pay | Admitting: Oncology

## 2021-11-18 ENCOUNTER — Encounter: Payer: Self-pay | Admitting: Obstetrics and Gynecology

## 2021-11-18 VITALS — BP 122/70 | Ht 68.0 in | Wt 191.0 lb

## 2021-11-18 DIAGNOSIS — Z113 Encounter for screening for infections with a predominantly sexual mode of transmission: Secondary | ICD-10-CM | POA: Diagnosis not present

## 2021-11-18 DIAGNOSIS — N3090 Cystitis, unspecified without hematuria: Secondary | ICD-10-CM | POA: Diagnosis not present

## 2021-11-18 DIAGNOSIS — Z1159 Encounter for screening for other viral diseases: Secondary | ICD-10-CM

## 2021-11-18 DIAGNOSIS — N760 Acute vaginitis: Secondary | ICD-10-CM | POA: Diagnosis not present

## 2021-11-18 DIAGNOSIS — Z114 Encounter for screening for human immunodeficiency virus [HIV]: Secondary | ICD-10-CM

## 2021-11-18 DIAGNOSIS — R300 Dysuria: Secondary | ICD-10-CM | POA: Diagnosis not present

## 2021-11-18 LAB — WET PREP FOR TRICH, YEAST, CLUE

## 2021-11-18 MED ORDER — LIDOCAINE 5 % EX OINT: 1.0000 | TOPICAL_OINTMENT | Freq: Three times a day (TID) | CUTANEOUS | 1 refills | Status: DC

## 2021-11-18 MED ORDER — VALACYCLOVIR HCL 500 MG PO TABS: 500.0000 mg | ORAL_TABLET | Freq: Every day | ORAL | 11 refills | Status: DC

## 2021-11-18 MED ORDER — SULFAMETHOXAZOLE-TRIMETHOPRIM 800-160 MG PO TABS: 1.0000 | ORAL_TABLET | Freq: Two times a day (BID) | ORAL | 0 refills | Status: DC

## 2021-11-18 NOTE — Telephone Encounter (Signed)
Patient called with a variety of symptoms. She mentioned external irritation/dryness and said it really bothers her about every other month. She also mentioned she thinks she might have UTI because she has started with some urinary frequency. I recommended OV and scheduled her for 3 pm today. She will check in by 2:45pm.

## 2021-11-18 NOTE — Progress Notes (Signed)
GYNECOLOGY  VISIT   HPI: 66 y.o.   Single  African American  female   [phone] with No LMP recorded (lmp unknown). Patient has had a hysterectomy.   here for dysuria and vulvar and vaginal irritation that spreads to rectal area.  Switches laundry detergent and toilet tissue.   Some relief of symptoms with AZO.  Drinking cranberry juice.  Taking ibuprofen.  Using epsom salt baths.   Dysuria increased lately.  Some urgency and frequency.  Some pelvic discomfort.   States possible slight fever.  Did not take her temperature.   May have had some nausea.   Fatigue.  New partner.  Used a condom.    Hx HSV 2.  Needs refill of Valtrex.   GYNECOLOGIC HISTORY: No LMP recorded (lmp unknown). Patient has had a hysterectomy. Contraception:  Hyst Menopausal hormone therapy:  none Last mammogram:  11-30-19 MR Breast Bil--SEE EPIC Last pap smear:  11-26-20 Neg:Neg HR HPV, 10/31/19 Neg, Neg HR HPV, 10-23-18 Neg:Neg HR HPV        OB History     Gravida  2   Para  2   Term  0   Preterm  0   AB  0   Living  2      SAB  0   IAB  0   Ectopic  0   Multiple  0   Live Births  0              Patient Active Problem List   Diagnosis Date Noted   Papillary thyroid carcinoma ([place]) 04/07/2020   Post-operative hypothyroidism 10/02/2019   Neoplasm of uncertain behavior of thyroid gland 09/16/2019   Multiple thyroid nodules 09/16/2019   COVID-19 virus infection 02/20/2019   Environmental allergies 01/12/2018   Port-A-Cath in place 11/09/2017   Malignant neoplasm of upper-outer quadrant of left breast in female, estrogen receptor positive ([place]) 08/10/2017    Past Medical History:  Diagnosis Date   Abnormal Pap smear of vagina 07/28/2016   LGSIL; colpo 07/2016 atrophic squamous cells; colpo 07/2017 - atypia   Allergy    Arthritis    Breast cancer ([place]) 2018   Metastatic Left breast   Elevated hemoglobin A1c 07/28/2016   level - 6.1   Endometriosis    GERD  (gastroesophageal reflux disease)    HSV-2 infection    Iron deficiency anemia    Low vitamin D level 07/28/2016   level 24.7   Personal history of chemotherapy    finished 10/19   Personal history of radiation therapy    finished 03/2018   Thyroid cancer ([place])    Thyroid neoplasm     Past Surgical History:  Procedure Laterality Date   ABDOMINAL HYSTERECTOMY     BREAST BIOPSY Left 08/09/2017   BREAST LUMPECTOMY Left 01/22/2018   BREAST LUMPECTOMY WITH RADIOACTIVE SEED AND AXILLARY LYMPH NODE DISSECTION Left 01/22/2018   Procedure: LEFT BREAST LUMPECTOMY WITH RADIOACTIVE SEED AND COMPLETE LEFT AXILLARY LYMPH NODE DISSECTION;  Surgeon: [name], MD;  Location: [place];  Service: General;  Laterality: Left;   CESAREAN SECTION     COLON SURGERY     COLOSTOMY     COLOSTOMY REVERSAL     FOOT SURGERY Right    done spur   OOPHORECTOMY     PORT-A-CATH REMOVAL Right 08/28/2018   Procedure: REMOVAL PORT-A-CATH;  Surgeon: [name], MD;  Location: [place];  Service: General;  Laterality: Right;   PORTACATH PLACEMENT N/A  09/06/2017   Procedure: INSERTION PORT-A-CATH;  Surgeon: [name], MD;  Location: [place];  Service: General;  Laterality: N/A;   SMALL INTESTINE SURGERY     SPINE SURGERY     Injections   THYROIDECTOMY N/A 09/20/2019   Procedure: TOTAL THYROIDECTOMY;  Surgeon: [name], MD;  Location: [facility];  Service: General;  Laterality: N/A;   TUBAL LIGATION  1995    Current Outpatient Medications  Medication Sig Dispense Refill   anastrozole (ARIMIDEX) 1 MG tablet Take 1 tablet (1 mg total) by mouth daily. 90 tablet 14   Artificial Tear Solution (OPTI-TEARS OP) Place 1 drop into both eyes 3 (three) times daily as needed (for dry/irritated eyes.).     Ascorbic Acid (VITAMIN C) 100 MG CHEW Chew 100 mg by mouth daily.      BORIC ACID EX Apply topically.      Cholecalciferol 25 MCG (1000 UT) tablet Take by mouth.      fluticasone (FLONASE) 50 MCG/ACT nasal spray SPRAY 2 SPRAYS INTO EACH NOSTRIL EVERY DAY 16 mL 2   ibuprofen (ADVIL) 800 MG tablet Take 800 mg by mouth 3 (three) times daily as needed.     levothyroxine (SYNTHROID) 100 MCG tablet Take 1 tablet (100 mcg total) by mouth daily. 90 tablet 3   lidocaine (XYLOCAINE) 5 % ointment Apply 1 application topically 3 (three) times daily. Uses as needed. 1.25 g 1   Multiple Vitamins-Minerals (VITAMIN D3 COMPLETE PO) Take 1 tablet by mouth daily.      naproxen sodium (ALEVE) 220 MG tablet Take 220 mg by mouth 2 (two) times daily as needed (FOR PAIN.).     Probiotic Product (ALIGN PO) Take by mouth.      valACYclovir (VALTREX) 500 MG tablet Take 1 tablet (500 mg total) by mouth daily. Take one tablet (500 mg) twice a day for three days as needed. 36 tablet 11   No current facility-administered medications for this visit.     ALLERGIES: Penicillins, Bee venom, and Tramadol  Family History  Problem Relation Age of Onset   Mental illness Mother    Alcoholism Mother    Cirrhosis Mother    Alcohol abuse Mother    Cancer Father    Lung cancer Father     Social History   Socioeconomic History   Marital status: Single    Spouse name: Not on file   Number of children: 2   Years of education: Not on file   Highest education level: Not on file  Occupational History   Not on file  Tobacco Use   Smoking status: Never   Smokeless tobacco: Never  Vaping Use   Vaping Use: Never used  Substance and Sexual Activity   Alcohol use: Yes    Alcohol/week: 1.0 standard drink of alcohol    Types: 1 Glasses of wine per week    Comment: Social   Drug use: Not Currently    Types: Marijuana    Comment: everyday   Sexual activity: Not Currently    Birth control/protection: Post-menopausal, Surgical    Comment: Hysterectomy  Other Topics Concern   Not on file  Social History Narrative   Retired from [employer] -- retiring next April   Has  children    Social Determinants of Health   Financial Resource Strain: Low Risk  (07/02/2021)   Overall Financial Resource Strain (CARDIA)    Difficulty of Paying Living Expenses: Not hard at all  Food Insecurity: No  Food Insecurity (07/02/2021)   Hunger Vital Sign    Worried About Running Out of Food in the Last Year: Never true    Ran Out of Food in the Last Year: Never true  Transportation Needs: No Transportation Needs (07/02/2021)   PRAPARE - Hydrologist (Medical): No    Lack of Transportation (Non-Medical): No  Physical Activity: Inactive (07/02/2021)   Exercise Vital Sign    Days of Exercise per Week: 0 days    Minutes of Exercise per Session: 0 min  Stress: No Stress Concern Present (07/02/2021)   [place]    Feeling of Stress : Not at all  Social Connections: Moderately Integrated (07/02/2021)   Social Connection and Isolation Panel [NHANES]    Frequency of Communication with Friends and Family: More than three times a week    Frequency of Social Gatherings with Friends and Family: More than three times a week    Attends Religious Services: More than 4 times per year    Active Member of [employer] or Organizations: Yes    Attends Archivist Meetings: 1 to 4 times per year    Marital Status: Never married  Intimate Partner Violence: Not At Risk (07/02/2021)   Humiliation, Afraid, Rape, and Kick questionnaire    Fear of Current or Ex-Partner: No    Emotionally Abused: No    Physically Abused: No    Sexually Abused: No    Review of Systems  Genitourinary:  Positive for dysuria and vaginal pain (vaginal irritation).  All other systems reviewed and are negative.   PHYSICAL EXAMINATION:    BP 122/70   Ht '5\' 8"'$  (1.727 m)   Wt 191 lb (86.6 kg)   LMP  (LMP Unknown)   BMI 29.04 kg/m     General appearance: alert, cooperative and appears stated age   Pelvic: External genitalia:   no lesions              Urethra:  normal appearing urethra with no masses, tenderness or lesions              Bartholins and Skenes: normal                 Vagina: generalized erythema of the vagina and yellow/green frothy discharge.               Cervix: absent                Bimanual Exam:  Uterus:  absent              Adnexa: no mass, fullness, tenderness            Chaperone was present for exam:  Estill Bamberg, CMA  ASSESSMENT  Vulvovaginitis.  STD screening.  Cystitis/dysuria.  Status post TAH/BSO/appy.  Hx metastatic left breast cancer. On Arimidex.  Hx VAIN I.  Hx HSV II.   PLAN  Wet prep:  negative for yeast, trichomonas, and clue cells.  Nuswab for GC/CT/trich/yeast/BV Serum STD screening.  Urinalysis:  40 - 60 WBC, 0 - 2 RBC, 6 - 10 squams, mod bacteria.  UC sent.  Bactrim DS po bid x 3 days.  Valtrex 500 mg daily.  Lidocaine ointment prn.  She will follow up for her next visit in October.   An After Visit Summary was printed and given to the patient.  33 min  total time was spent for this patient encounter, including  preparation, face-to-face counseling with the patient, coordination of care, and documentation of the encounter.

## 2021-11-20 LAB — URINE CULTURE
MICRO NUMBER:: 13949820
Result:: NO GROWTH
SPECIMEN QUALITY:: ADEQUATE

## 2021-11-20 LAB — URINALYSIS, COMPLETE W/RFL CULTURE
Bilirubin Urine: NEGATIVE
Glucose, UA: NEGATIVE
Hyaline Cast: NONE SEEN /LPF
Ketones, ur: NEGATIVE
Nitrites, Initial: NEGATIVE
Protein, ur: NEGATIVE
Specific Gravity, Urine: 1.01 (ref 1.001–1.035)
pH: 6.5 (ref 5.0–8.0)

## 2021-11-20 LAB — CULTURE INDICATED

## 2021-11-21 LAB — CERVICOVAGINAL ANCILLARY ONLY
Bacterial Vaginitis (gardnerella): NEGATIVE
Candida Glabrata: NEGATIVE
Candida Vaginitis: NEGATIVE
Chlamydia: NEGATIVE
Comment: NEGATIVE
Comment: NEGATIVE
Comment: NEGATIVE
Comment: NEGATIVE
Comment: NEGATIVE
Comment: NORMAL
Neisseria Gonorrhea: NEGATIVE
Trichomonas: NEGATIVE

## 2021-11-22 ENCOUNTER — Encounter: Payer: Self-pay | Admitting: *Deleted

## 2021-11-24 ENCOUNTER — Other Ambulatory Visit: Payer: Self-pay | Admitting: Obstetrics and Gynecology

## 2021-11-24 NOTE — Telephone Encounter (Signed)
Patient called stating she completed 3 day dose but still has dysuria and vulvar and vaginal irritation as noted on office visit on 11/18/21. Patient has used coconut oil reports it burns. Reports the Bactrim did help some with symptoms. States you told her to use vitamin e, but is is afraid to use it.   She is scheduled for annual exam on 11/30/21

## 2021-11-24 NOTE — Progress Notes (Deleted)
66 y.o. [id number] Single {Race/ethnicity:17218} female here for annual exam.    PCP:     No LMP recorded (lmp unknown). Patient has had a hysterectomy.           Sexually active: {yes no:314532}  The current method of family planning is status post hysterectomy.    Exercising: {yes no:314532}  {types:19826} Smoker:  {YES NO:22349}  Health Maintenance: Pap:  11-26-20 neg HPV HR neg, 10-31-19 neg HPV HR neg History of abnormal Pap:  2019 ascus hpv hr neg MMG:  01-27-21 category b density birads 2:neg Colonoscopy:  yrs ago, per patient normal BMD:   11-28-19  Result  *** TDaP:  10-23-18 Gardasil:   no HIV: neg 10-27-20 Hep C: neg 10-27-20 Screening Labs:  Hb today: ***, Urine today: ***   reports that she has never smoked. She has never used smokeless tobacco. She reports current alcohol use of about 1.0 standard drink of alcohol per week. She reports that she does not currently use drugs after having used the following drugs: Marijuana.  Past Medical History:  Diagnosis Date   Abnormal Pap smear of vagina 07/28/2016   LGSIL; colpo 07/2016 atrophic squamous cells; colpo 07/2017 - atypia   Allergy    Arthritis    Breast cancer ([place]) 2018   Metastatic Left breast   Elevated hemoglobin A1c 07/28/2016   level - 6.1   Endometriosis    GERD (gastroesophageal reflux disease)    HSV-2 infection    Iron deficiency anemia    Low vitamin D level 07/28/2016   level 24.7   Personal history of chemotherapy    finished 10/19   Personal history of radiation therapy    finished 03/2018   Thyroid cancer [facility])    Thyroid neoplasm     Past Surgical History:  Procedure Laterality Date   ABDOMINAL HYSTERECTOMY     BREAST BIOPSY Left 08/09/2017   BREAST LUMPECTOMY Left 01/22/2018   BREAST LUMPECTOMY WITH RADIOACTIVE SEED AND AXILLARY LYMPH NODE DISSECTION Left 01/22/2018   Procedure: LEFT BREAST LUMPECTOMY WITH RADIOACTIVE SEED AND COMPLETE LEFT AXILLARY LYMPH NODE DISSECTION;  Surgeon: [name], MD;  Location: [place];  Service: General;  Laterality: Left;   CESAREAN SECTION     COLON SURGERY     COLOSTOMY     COLOSTOMY REVERSAL     FOOT SURGERY Right    done spur   OOPHORECTOMY     PORT-A-CATH REMOVAL Right 08/28/2018   Procedure: REMOVAL PORT-A-CATH;  Surgeon: [name], MD;  Location: [place];  Service: General;  Laterality: Right;   PORTACATH PLACEMENT N/A 09/06/2017   Procedure: INSERTION PORT-A-CATH;  Surgeon: [name], MD;  Location: [place];  Service: General;  Laterality: N/A;   SMALL INTESTINE SURGERY     SPINE SURGERY     Injections   THYROIDECTOMY N/A 09/20/2019   Procedure: TOTAL THYROIDECTOMY;  Surgeon: [name], MD;  [facility];  Service: General;  Laterality: N/A;   TUBAL LIGATION  1995    Current Outpatient Medications  Medication Sig Dispense Refill   anastrozole (ARIMIDEX) 1 MG tablet Take 1 tablet (1 mg total) by mouth daily. 90 tablet 14   Artificial Tear Solution (OPTI-TEARS OP) Place 1 drop into both eyes 3 (three) times daily as needed (for dry/irritated eyes.).     Ascorbic Acid (VITAMIN C) 100 MG CHEW Chew 100 mg by mouth daily.      BORIC ACID EX Apply topically.  Cholecalciferol 25 MCG (1000 UT) tablet Take by mouth.     fluticasone (FLONASE) 50 MCG/ACT nasal spray SPRAY 2 SPRAYS INTO EACH NOSTRIL EVERY DAY 16 mL 2   ibuprofen (ADVIL) 800 MG tablet Take 800 mg by mouth 3 (three) times daily as needed.     levothyroxine (SYNTHROID) 100 MCG tablet Take 1 tablet (100 mcg total) by mouth daily. 90 tablet 3   lidocaine (XYLOCAINE) 5 % ointment Apply 1 Application topically 3 (three) times daily. Uses as needed. 1.25 g 1   Multiple Vitamins-Minerals (VITAMIN D3 COMPLETE PO) Take 1 tablet by mouth daily.      naproxen sodium (ALEVE) 220 MG tablet Take 220 mg by mouth 2 (two) times daily as needed (FOR PAIN.).     Probiotic Product (ALIGN PO) Take by mouth.       sulfamethoxazole-trimethoprim (BACTRIM DS) 800-160 MG tablet Take 1 tablet by mouth 2 (two) times daily. One PO BID x 3 days 6 tablet 0   valACYclovir (VALTREX) 500 MG tablet Take 1 tablet (500 mg total) by mouth daily. Take one tablet (500 mg) twice a day for three days as needed. 36 tablet 11   No current facility-administered medications for this visit.    Family History  Problem Relation Age of Onset   Mental illness Mother    Alcoholism Mother    Cirrhosis Mother    Alcohol abuse Mother    Cancer Father    Lung cancer Father     Review of Systems  Exam:   LMP  (LMP Unknown)     General appearance: alert, cooperative and appears stated age Head: normocephalic, without obvious abnormality, atraumatic Neck: no adenopathy, supple, symmetrical, trachea midline and thyroid normal to inspection and palpation Lungs: clear to auscultation bilaterally Breasts: normal appearance, no masses or tenderness, No nipple retraction or dimpling, No nipple discharge or bleeding, No axillary adenopathy Heart: regular rate and rhythm Abdomen: soft, non-tender; no masses, no organomegaly Extremities: extremities normal, atraumatic, no cyanosis or edema Skin: skin color, texture, turgor normal. No rashes or lesions Lymph nodes: cervical, supraclavicular, and axillary nodes normal. Neurologic: grossly normal  Pelvic: External genitalia:  no lesions              No abnormal inguinal nodes palpated.              Urethra:  normal appearing urethra with no masses, tenderness or lesions              Bartholins and Skenes: normal                 Vagina: normal appearing vagina with normal color and discharge, no lesions              Cervix: no lesions              Pap taken: {yes no:314532} Bimanual Exam:  Uterus:  normal size, contour, position, consistency, mobility, non-tender              Adnexa: no mass, fullness, tenderness              Rectal exam: {yes no:314532}.  Confirms.               Anus:  normal sphincter tone, no lesions  Chaperone was present for exam:  ***  Assessment:   Well woman visit with gynecologic exam.   Plan: Mammogram screening discussed. Self breast awareness reviewed. Pap and HR HPV as above. Guidelines for Calcium, Vitamin  D, regular exercise program including cardiovascular and weight bearing exercise.   Follow up annually and prn.   Additional counseling given.  {yes [id number]. _______ minutes face to face time of which over 50% was spent in counseling.    After visit summary provided.

## 2021-11-25 NOTE — Telephone Encounter (Signed)
Patient informed. 

## 2021-11-26 ENCOUNTER — Telehealth: Payer: Self-pay

## 2021-11-26 ENCOUNTER — Other Ambulatory Visit: Payer: Self-pay | Admitting: Obstetrics and Gynecology

## 2021-11-26 DIAGNOSIS — N761 Subacute and chronic vaginitis: Secondary | ICD-10-CM

## 2021-11-26 DIAGNOSIS — N898 Other specified noninflammatory disorders of vagina: Secondary | ICD-10-CM

## 2021-11-26 MED ORDER — NONFORMULARY OR COMPOUNDED ITEM: 0 refills | Status: DC

## 2021-11-26 NOTE — Telephone Encounter (Signed)
Pt was notified and voiced understanding/appreciation.

## 2021-11-26 NOTE — Telephone Encounter (Signed)
Thank you for the update!

## 2021-11-26 NOTE — Telephone Encounter (Signed)
While I spoke with pt about test results and your recommendations she reported that she spoke with a friend regarding her sxs and her friend had told her that she was just "dry" down there and recommended that she shave and try Replens. Pt reports she tried what friend recommended last night and states that it did "make her feel like a new woman," meaning she felt better but is wanting to comply with your recommendations of the hydrocortisone cream qhs for one month and follow up for bx if needed. Appt scheduled for 01/04/22. However, pt wants to know if it was ok to still use replens and hydrocortisone cream or if just needs to stick w/ Rx only for now and also if ok to have IC while using it? Please advise.   Also would you mind confirming the type of procedure code you would like for [place] to put in? Should we put it under like a colposcopy or maybe just vaginal biopsy? Thanks in advance.

## 2021-11-26 NOTE — Telephone Encounter (Signed)
It is true she may have significant atrophy symptoms which arecausing her discomfort.  She is unable to take vaginal estrogen due to her history of breast cancer.   I would have her consider the vaginal steroid cream, which may give her a lot of comfort.   If she does not like it, she can stop stop it.   If she is going to have sex, she should skip the hydrocortisone cream and use the Replens. I would not use both at the same time.  She could try the hydrocortisone cream at night and the Replens during the day if needed.  She does not need a colposcopy, just a potential vaginal biopsy.  I ordered it and coded it under vaginal lesion.  The vaginal just looked really inflamed and red, which can be a sign of this DIV or of other conditions like lichen planus.    Neither of these are cancer.

## 2021-11-29 ENCOUNTER — Encounter: Payer: Self-pay | Admitting: Obstetrics and Gynecology

## 2021-11-29 ENCOUNTER — Ambulatory Visit (INDEPENDENT_AMBULATORY_CARE_PROVIDER_SITE_OTHER): Payer: Medicare Other | Admitting: Obstetrics and Gynecology

## 2021-11-29 VITALS — BP 118/80 | Ht 68.0 in | Wt 183.0 lb

## 2021-11-29 DIAGNOSIS — Z9189 Other specified personal risk factors, not elsewhere classified: Secondary | ICD-10-CM

## 2021-11-29 DIAGNOSIS — M8580 Other specified disorders of bone density and structure, unspecified site: Secondary | ICD-10-CM

## 2021-11-29 DIAGNOSIS — Z01419 Encounter for gynecological examination (general) (routine) without abnormal findings: Secondary | ICD-10-CM

## 2021-11-29 DIAGNOSIS — B009 Herpesviral infection, unspecified: Secondary | ICD-10-CM | POA: Diagnosis not present

## 2021-11-29 DIAGNOSIS — N761 Subacute and chronic vaginitis: Secondary | ICD-10-CM

## 2021-11-29 NOTE — Progress Notes (Signed)
66 y.o. [id number] Single African American female here for breast and pelvic exam and follow up of vaginitis.  She is followed for HSV II. Already received refill for Valtrex at last visit.   Has vaginitis for years. She is using Vagasil and is much better.  Rx for steroid cream prescribed for patient to treat suspected desquamative vaginitis.  Sex is much less painful. No bleeding and no odor.   New sexual partner.  Negative GC/CT/trich/BV, and yeast testing on 11/18/21 when patient presented for dysuria and vaginal and vulvar irritation.  Her UC was negative.  She has a history of breast cancer and cannot use estrogens.  PCP:   [name], PA  No LMP recorded (lmp unknown). Patient has had a hysterectomy.           Sexually active: Yes.    The current method of family planning is postmenopausal. Smoker:  no  Health Maintenance: Pap:  11/26/2020 NEG:neg HR HPV, 10-23-18 Neg:Neg HR HPV, 08-10-17 ASCUS:Neg HR HPV  History of abnormal Pap:   Yes, 08-17-17 colpo showing mild atypia of vagina; 08-10-17 ASCUS:Neg HR HPV.  09/09/16 colpo biopsy showing atrophic squamous cells (menopausal change); 07/28/16 LGSIL MMG:  01/27/2021 BI-RADS CATEGORY  2: Benign Colonoscopy:  cologuard 2 years ago per pt BMD:  11/28/19 -  Result  osteopenia TDaP:  09/30/2018 Gardasil:   no, NA Screening Labs:  PCP in December   reports that she has never smoked. She has never used smokeless tobacco. She reports current alcohol use of about 1.0 standard drink of alcohol per week. She reports that she does not currently use drugs after having used the following drugs: Marijuana.  Past Medical History:  Diagnosis Date   Abnormal Pap smear of vagina 07/28/2016   LGSIL; colpo 07/2016 atrophic squamous cells; colpo 07/2017 - atypia   Allergy    Arthritis    Breast cancer ([place]) 2018   Metastatic Left breast   Elevated hemoglobin A1c 07/28/2016   level - 6.1   Endometriosis    GERD (gastroesophageal reflux disease)     HSV-2 infection    Iron deficiency anemia    Low vitamin D level 07/28/2016   level 24.7   Personal history of chemotherapy    finished 10/19   Personal history of radiation therapy    finished 03/2018   Thyroid cancer [facility])    Thyroid neoplasm     Past Surgical History:  Procedure Laterality Date   ABDOMINAL HYSTERECTOMY     BREAST BIOPSY Left 08/09/2017   BREAST LUMPECTOMY Left 01/22/2018   BREAST LUMPECTOMY WITH RADIOACTIVE SEED AND AXILLARY LYMPH NODE DISSECTION Left 01/22/2018   Procedure: LEFT BREAST LUMPECTOMY WITH RADIOACTIVE SEED AND COMPLETE LEFT AXILLARY LYMPH NODE DISSECTION;  Surgeon: [name], MD;  Location: [place];  Service: General;  Laterality: Left;   CESAREAN SECTION     COLON SURGERY     COLOSTOMY     COLOSTOMY REVERSAL     FOOT SURGERY Right    done spur   OOPHORECTOMY     PORT-A-CATH REMOVAL Right 08/28/2018   Procedure: REMOVAL PORT-A-CATH;  Surgeon: [name], MD;  Location: [place];  Service: General;  Laterality: Right;   PORTACATH PLACEMENT N/A 09/06/2017   Procedure: INSERTION PORT-A-CATH;  Surgeon: [name], MD;  Location: [place];  Service: General;  Laterality: N/A;   SMALL INTESTINE SURGERY     SPINE SURGERY     Injections   THYROIDECTOMY N/A 09/20/2019  Procedure: TOTAL THYROIDECTOMY;  Surgeon: [name], MD;  [facility];  Service: General;  Laterality: N/A;   TUBAL LIGATION  1995    Current Outpatient Medications  Medication Sig Dispense Refill   anastrozole (ARIMIDEX) 1 MG tablet Take 1 tablet (1 mg total) by mouth daily. 90 tablet 14   Artificial Tear Solution (OPTI-TEARS OP) Place 1 drop into both eyes 3 (three) times daily as needed (for dry/irritated eyes.).     Ascorbic Acid (VITAMIN C) 100 MG CHEW Chew 100 mg by mouth daily.      BORIC ACID EX Apply topically.      Cholecalciferol 25 MCG (1000 UT) tablet Take by mouth.     fluticasone (FLONASE) 50 MCG/ACT nasal spray  SPRAY 2 SPRAYS INTO EACH NOSTRIL EVERY DAY 16 mL 2   ibuprofen (ADVIL) 800 MG tablet Take 800 mg by mouth 3 (three) times daily as needed.     levothyroxine (SYNTHROID) 100 MCG tablet Take 1 tablet (100 mcg total) by mouth daily. 90 tablet 3   lidocaine (XYLOCAINE) 5 % ointment Apply 1 Application topically 3 (three) times daily. Uses as needed. 1.25 g 1   Multiple Vitamins-Minerals (VITAMIN D3 COMPLETE PO) Take 1 tablet by mouth daily.      naproxen sodium (ALEVE) 220 MG tablet Take 220 mg by mouth 2 (two) times daily as needed (FOR PAIN.).     NONFORMULARY OR COMPOUNDED ITEM Hydrocortisone 10% Vaginal Cream Insert 3 grams into vagina qhs x 4 weeks 84 each 0   Probiotic Product (ALIGN PO) Take by mouth.      sulfamethoxazole-trimethoprim (BACTRIM DS) 800-160 MG tablet Take 1 tablet by mouth 2 (two) times daily. One PO BID x 3 days 6 tablet 0   valACYclovir (VALTREX) 500 MG tablet Take 1 tablet (500 mg total) by mouth daily. Take one tablet (500 mg) twice a day for three days as needed. 36 tablet 11   No current facility-administered medications for this visit.    Family History  Problem Relation Age of Onset   Mental illness Mother    Alcoholism Mother    Cirrhosis Mother    Alcohol abuse Mother    Cancer Father    Lung cancer Father     Review of Systems  All other systems reviewed and are negative.   Exam:   BP 118/80 (BP Location: Right Arm, Patient Position: Sitting)   Ht '5\' 8"'$  (1.727 m)   Wt 183 lb (83 kg)   LMP  (LMP Unknown)   BMI 27.83 kg/m     General appearance: alert, cooperative and appears stated age Head: normocephalic, without obvious abnormality, atraumatic Neck: no adenopathy, supple, symmetrical, trachea midline and thyroid normal to inspection and palpation Lungs: clear to auscultation bilaterally Breasts: normal appearance, no masses or tenderness, No nipple retraction or dimpling, No nipple discharge or bleeding, No axillary adenopathy Heart: regular  rate and rhythm Abdomen: soft, non-tender; no masses, no organomegaly Extremities: extremities normal, atraumatic, no cyanosis or edema Skin: skin color, texture, turgor normal. No rashes or lesions Lymph nodes: cervical, supraclavicular, and axillary nodes normal. Neurologic: grossly normal  Pelvic: External genitalia:  no lesions              No abnormal inguinal nodes palpated.              Urethra:  normal appearing urethra with no masses, tenderness or lesions              Bartholins and  Skenes: normal                 Vagina: generalized erythema of the vagina and yellow//green thin discharge.               Cervix: absent              Pap taken: no Bimanual Exam:  Uterus: absent              Adnexa: no mass, fullness, tenderness              Rectal exam: yes.  Confirms.              Anus:  normal sphincter tone, no lesions  Chaperone was present for exam:  Eustace Pen, CMA  Assessment:   Well woman visit with gynecologic exam. High risk medical GYN exam. Status post TAH/BSO/appy.  Vaginitis.  I suspect patient does have a combination of atrophy and desquamative vaginitis.  Hx metastatic left breast cancer. On Arimidex.  Hx VAIN I.  Hx HSV II.  Hx thyroid cancer.   Status post thyroidectomy. Osteopenia.  Plan: Mammogram screening discussed. Self breast awareness reviewed. Pap 2024. Guidelines for Calcium, Vitamin D, regular exercise program including cardiovascular and weight bearing exercise. We discussed products to increase hydration of the vagina - water based lubricants, cooking oils.  I recommend the course of hydrocortisone compounded vaginal cream:  3 grams per vgina at hs x 4 weeks. BMD is due this fall.  Fu for a recheck in 1 month. Follow up annually and prn.  After visit summary provided.   21 min  total time was spent for this patient encounter, including preparation, face-to-face counseling with the patient, coordination of care, and documentation of the  encounter.

## 2021-11-30 ENCOUNTER — Ambulatory Visit: Payer: Medicare Other | Admitting: Obstetrics and Gynecology

## 2021-12-04 NOTE — Patient Instructions (Signed)

## 2021-12-13 LAB — GLUCOSE, POCT (MANUAL RESULT ENTRY): POC Glucose: 139 mg/dl — AB (ref 70–99)

## 2021-12-22 ENCOUNTER — Inpatient Hospital Stay (HOSPITAL_BASED_OUTPATIENT_CLINIC_OR_DEPARTMENT_OTHER): Payer: Medicare Other | Admitting: Hematology and Oncology

## 2021-12-22 ENCOUNTER — Inpatient Hospital Stay: Payer: Medicare Other | Attending: Hematology and Oncology

## 2021-12-22 ENCOUNTER — Encounter: Payer: Self-pay | Admitting: Hematology and Oncology

## 2021-12-22 ENCOUNTER — Other Ambulatory Visit: Payer: Self-pay | Admitting: *Deleted

## 2021-12-22 VITALS — BP 124/78 | HR 62 | Temp 97.8°F | Resp 16 | Ht 68.0 in | Wt 181.1 lb

## 2021-12-22 DIAGNOSIS — C50412 Malignant neoplasm of upper-outer quadrant of left female breast: Secondary | ICD-10-CM | POA: Insufficient documentation

## 2021-12-22 DIAGNOSIS — Z170 Estrogen receptor positive status [ER+]: Secondary | ICD-10-CM

## 2021-12-22 DIAGNOSIS — Z79811 Long term (current) use of aromatase inhibitors: Secondary | ICD-10-CM | POA: Insufficient documentation

## 2021-12-22 DIAGNOSIS — C73 Malignant neoplasm of thyroid gland: Secondary | ICD-10-CM | POA: Insufficient documentation

## 2021-12-22 DIAGNOSIS — C773 Secondary and unspecified malignant neoplasm of axilla and upper limb lymph nodes: Secondary | ICD-10-CM | POA: Insufficient documentation

## 2021-12-22 LAB — CBC WITH DIFFERENTIAL (CANCER CENTER ONLY)
Abs Immature Granulocytes: 0.02 10*3/uL (ref 0.00–0.07)
Basophils Absolute: 0 10*3/uL (ref 0.0–0.1)
Basophils Relative: 0 %
Eosinophils Absolute: 0.1 10*3/uL (ref 0.0–0.5)
Eosinophils Relative: 1 %
HCT: 39.6 % (ref 36.0–46.0)
Hemoglobin: 12.7 g/dL (ref 12.0–15.0)
Immature Granulocytes: 0 %
Lymphocytes Relative: 29 %
Lymphs Abs: 2.3 10*3/uL (ref 0.7–4.0)
MCH: 27.3 pg (ref 26.0–34.0)
MCHC: 32.1 g/dL (ref 30.0–36.0)
MCV: 85 fL (ref 80.0–100.0)
Monocytes Absolute: 0.6 10*3/uL (ref 0.1–1.0)
Monocytes Relative: 7 %
Neutro Abs: 4.9 10*3/uL (ref 1.7–7.7)
Neutrophils Relative %: 63 %
Platelet Count: 309 10*3/uL (ref 150–400)
RBC: 4.66 MIL/uL (ref 3.87–5.11)
RDW: 14.6 % (ref 11.5–15.5)
WBC Count: 8 10*3/uL (ref 4.0–10.5)
nRBC: 0 % (ref 0.0–0.2)

## 2021-12-22 LAB — CMP (CANCER CENTER ONLY)
ALT: 15 U/L (ref 0–44)
AST: 13 U/L — ABNORMAL LOW (ref 15–41)
Albumin: 4.2 g/dL (ref 3.5–5.0)
Alkaline Phosphatase: 137 U/L — ABNORMAL HIGH (ref 38–126)
Anion gap: 8 (ref 5–15)
BUN: 11 mg/dL (ref 8–23)
CO2: 27 mmol/L (ref 22–32)
Calcium: 10 mg/dL (ref 8.9–10.3)
Chloride: 105 mmol/L (ref 98–111)
Creatinine: 0.94 mg/dL (ref 0.44–1.00)
GFR, Estimated: >60 mL/min (ref >60)
Glucose, Bld: 101 mg/dL — ABNORMAL HIGH (ref 70–99)
Potassium: 3.3 mmol/L — ABNORMAL LOW (ref 3.5–5.1)
Sodium: 140 mmol/L (ref 135–145)
Total Bilirubin: 0.6 mg/dL (ref 0.3–1.2)
Total Protein: 7.3 g/dL (ref 6.5–8.1)

## 2021-12-22 NOTE — Progress Notes (Signed)
[name]  Telephone:(336) [phone] Fax:(336) [phone]    ID: [name] DOB: [birth year]  MR#: [id number]  CSN#:[id number]  Patient Care Team: [name], PA as PCP - General (Physician Assistant) [name], [name] Dad, MD (Inactive) as Consulting Physician (Oncology) [name], [name] All, MD as Consulting Physician (Obstetrics and Gynecology) Bensimhon, [name], MD as Consulting Physician (Cardiology) [name], MD as Attending Physician (Physical Medicine and Rehabilitation) [facility], [name], MD as Consulting Physician (Endocrinology) [name], MD as Consulting Physician (General Surgery) OTHER MD:   CHIEF COMPLAINT: HER-2 positive, weakly estrogen receptor positive breast cancer  CURRENT TREATMENT: Anastrozole   INTERVAL HISTORY:  Lexia returns today for follow-up of her HER-2 positive, weakly estrogen receptor positive breast cancer.  She has been doing well. Since last visit, she has lost 23 lbs and has been exercising regularly. She has been using soursop supplements, black seed oil.S he has been going gym and has been exercising regularly. She was also diagnosed with papillary thyroid cancer, had total thyroidectomy, currently on levothyroxine replacement and underwent radioactive iodine treatment after thyroidectomy.  She is doing well overall.  She denies any changes in her breast concerning for malignancy.  COVID 19 VACCINATION STATUS: [name] x3, most recently 01/2020; infection 01/2019 and 08/2020 (received molnupiravir)   HISTORY OF CURRENT ILLNESS: From the original intake note:  Madison Hopkins noted a mass in the left axilla sometime in January or February 2019.  She eventually brought her to her gynecologist's attention, and underwent bilateral diagnostic mammography with tomography and left breast ultrasonography at [facility] on 08/04/2017 showing: breast density category B. There is a highly suspicious hypoechoic  mass in the left breast at the 2 o'clock upper outer quadrant measuring 2.3 x 1.6 x 2.2 cm, located 2 cm from the nipple. Sonographically, there were 2 enlarged lymph nodes in the left axilla, the largest with cortical thickening measuring 2.5 cm. No evidence of malignancy was seen in the right breast.   Accordingly on 08/07/2017 she proceeded to biopsy of the left breast area and 1 of the lymph nodes in question. The pathology from this procedure showed ([id number]): Invasive ductal carcinoma, grade 3. Metastatic carcinoma was found in one left axillary lymph node. Prognostic indicators significant for: estrogen receptor, 30% positive with weak staining intensity and progesterone receptor, 0% negative. Proliferation marker Ki67 at 80%. HER2 amplified with ratios HER2/CEP17 signals 2.32 and average HER2 copies per cell 6.60  The patient's subsequent history is as detailed below.   PAST MEDICAL HISTORY: Past Medical History:  Diagnosis Date   Abnormal Pap smear of vagina 07/28/2016   LGSIL; colpo 07/2016 atrophic squamous cells; colpo 07/2017 - atypia   Allergy    Arthritis    Breast cancer ([place]) 2018   Metastatic Left breast   Elevated hemoglobin A1c 07/28/2016   level - 6.1   Endometriosis    GERD (gastroesophageal reflux disease)    HSV-2 infection    Iron deficiency anemia    Low vitamin D level 07/28/2016   level 24.7   Personal history of chemotherapy    finished 10/19   Personal history of radiation therapy    finished 03/2018   Thyroid cancer ([place])    Thyroid neoplasm   GERD but no ulcers   PAST SURGICAL HISTORY: Past Surgical History:  Procedure Laterality Date   ABDOMINAL HYSTERECTOMY     BREAST BIOPSY Left 08/09/2017   BREAST LUMPECTOMY Left 01/22/2018   BREAST LUMPECTOMY WITH RADIOACTIVE  SEED AND AXILLARY LYMPH NODE DISSECTION Left 01/22/2018   Procedure: LEFT BREAST LUMPECTOMY WITH RADIOACTIVE SEED AND COMPLETE LEFT AXILLARY LYMPH NODE DISSECTION;  Surgeon: [name], MD;  Location: [place];  Service: General;  Laterality: Left;   CESAREAN SECTION     COLON SURGERY     COLOSTOMY     COLOSTOMY REVERSAL     FOOT SURGERY Right    done spur   OOPHORECTOMY     PORT-A-CATH REMOVAL Right 08/28/2018   Procedure: REMOVAL PORT-A-CATH;  Surgeon: [name], MD;  Location: [place];  Service: General;  Laterality: Right;   PORTACATH PLACEMENT N/A 09/06/2017   Procedure: INSERTION PORT-A-CATH;  Surgeon: [name], MD;  Location: [place];  Service: General;  Laterality: N/A;   SMALL INTESTINE SURGERY     SPINE SURGERY     Injections   THYROIDECTOMY N/A 09/20/2019   Procedure: TOTAL THYROIDECTOMY;  Surgeon: [name], MD;  [facility];  Service: General;  Laterality: N/A;   [place]  Hysterectomy with salpingo-oophorectomy, Tonsillectomy, Plantar Fascitis Right Foot Surgery    FAMILY HISTORY: Family History  Problem Relation Age of Onset   Mental illness Mother    Alcoholism Mother    Cirrhosis Mother    Alcohol abuse Mother    Cancer Father    Lung cancer Father    The patient' father died at age 29 due to lung cancer (heavy smoker). The patient's mother died due to liver cirrhosis (heavy drinker). The patient has 2 brothers and no sisters. There was a paternal 1st cousin with colon cancer diagnosed in the mid 40's, who also had cervical cancer. The mother of this 1st cousin (the patient's paternal aunt) had cancer (the patient's is unsure of what type). There was also a paternal uncle with prostate cancer diagnosed in the 58's. The patient denies a family history of breast or ovarian cancer.     GYNECOLOGIC HISTORY:  No LMP recorded (lmp unknown). Patient has had a hysterectomy. Menarche: 66 years old Age at first live birth: 66 years old She is GXP2.  The patient is status post total hysterectomy with bilateral salpingo-oophorectomy in 1995.  She never used contraception. She notes  that she had an estrogen shot one time but no other HRTs.    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  She describes herself single. At home is herself and no pets. Her son, [name] lives in [place], [place] where he works as a Musician. The patient's daughter [name] lives in [place] in Therapist, art for [employer]. The patient has 5 grandchildren and 4 great grandchildren. She attends [employer].   ADVANCED DIRECTIVES: at the 08/16/2017 visit the patient was given the appropriate forms to complete on notarized at her discretion   HEALTH MAINTENANCE: Social History   Tobacco Use   Smoking status: Never   Smokeless tobacco: Never  Vaping Use   Vaping Use: Never used  Substance Use Topics   Alcohol use: Yes    Alcohol/week: 1.0 standard drink of alcohol    Types: 1 Glasses of wine per week    Comment: Social   Drug use: Not Currently    Types: Marijuana    Comment: everyday     Colonoscopy: 2009?  PAP: 10/2019, negative  Bone density: 10/24/2016, -0.7   Allergies  Allergen Reactions   Penicillins Nausea Only, Hives and Nausea  And Vomiting    Has patient had a PCN reaction causing immediate rash, facial/tongue/throat swelling, SOB or lightheadedness with hypotension: No Has patient had a PCN reaction causing severe rash involving mucus membranes or skin necrosis: No Has patient had a PCN reaction that required hospitalization: No Has patient had a PCN reaction occurring within the last 10 years: Yes--nausea & headache ONLY If all of the above answers are "NO", then may proceed with Cephalosporin use.  Has patient had a PCN reaction causing immediate rash, facial/tongue/throat swelling, SOB or lightheadedness with hypotension: No Has patient had a PCN reaction causing severe rash involving mucus membranes or skin necrosis: No Has patient had a PCN reaction that required hospitalization: No Has patient had a  PCN reaction occurring within the last 10 years: Yes--nausea & headache ONLY If all of the above answers are "NO", then may proceed with Cephalosporin use. Has patient had a PCN reaction causing immediate rash, facial/tongue/throat swelling, SOB or lightheadedness with hypotension: No Has patient had a PCN reaction causing severe rash involving mucus membranes or skin necrosis: No Has patient had a PCN reaction that required hospitalization: No Has patient had a PCN reaction occurring within the last 10 years: Yes--nausea & headache ONLY If all of the above answers are "NO", then may proceed with Cephalosporin use.   Bee Venom    Tramadol Nausea Only    Current Outpatient Medications  Medication Sig Dispense Refill   anastrozole (ARIMIDEX) 1 MG tablet Take 1 tablet (1 mg total) by mouth daily. 90 tablet 14   Artificial Tear Solution (OPTI-TEARS OP) Place 1 drop into both eyes 3 (three) times daily as needed (for dry/irritated eyes.).     Ascorbic Acid (VITAMIN C) 100 MG CHEW Chew 100 mg by mouth daily.      BORIC ACID EX Apply topically.      Cholecalciferol 25 MCG (1000 UT) tablet Take by mouth.     fluticasone (FLONASE) 50 MCG/ACT nasal spray SPRAY 2 SPRAYS INTO EACH NOSTRIL EVERY DAY 16 mL 2   ibuprofen (ADVIL) 800 MG tablet Take 800 mg by mouth 3 (three) times daily as needed.     levothyroxine (SYNTHROID) 100 MCG tablet Take 1 tablet (100 mcg total) by mouth daily. 90 tablet 3   lidocaine (XYLOCAINE) 5 % ointment Apply 1 Application topically 3 (three) times daily. Uses as needed. 1.25 g 1   Multiple Vitamins-Minerals (VITAMIN D3 COMPLETE PO) Take 1 tablet by mouth daily.      naproxen sodium (ALEVE) 220 MG tablet Take 220 mg by mouth 2 (two) times daily as needed (FOR PAIN.).     NONFORMULARY OR COMPOUNDED ITEM Hydrocortisone 10% Vaginal Cream Insert 3 grams into vagina qhs x 4 weeks 84 each 0   Probiotic Product (ALIGN PO) Take by mouth.      sulfamethoxazole-trimethoprim (BACTRIM  DS) 800-160 MG tablet Take 1 tablet by mouth 2 (two) times daily. One PO BID x 3 days 6 tablet 0   valACYclovir (VALTREX) 500 MG tablet Take 1 tablet (500 mg total) by mouth daily. Take one tablet (500 mg) twice a day for three days as needed. 36 tablet 11   No current facility-administered medications for this visit.    OBJECTIVE: African-American woman who appears younger than stated age  89:   12/22/21 1215  BP: 124/78  Pulse: 62  Resp: 16  Temp: 97.8 F (36.6 C)  SpO2: 98%     Body mass index is 27.54 kg/m.  Wt Readings from Last 3 Encounters:  12/22/21 181 lb 1.6 oz (82.1 kg)  11/29/21 183 lb (83 kg)  11/18/21 191 lb (86.6 kg)  ECOG FS:1 - Symptomatic but completely ambulatory  Sclerae unicteric, EOMs intact Neck: No cervical or regional adenopathy Breast: Left breast status postlumpectomy and radiation.  No palpable masses in bilateral breast.  No regional adenopathy.  LAB RESULTS:  CMP     Component Value Date/Time   NA 140 12/22/2021 1133   K 3.3 (L) 12/22/2021 1133   CL 105 12/22/2021 1133   CO2 27 12/22/2021 1133   GLUCOSE 101 (H) 12/22/2021 1133   BUN 11 12/22/2021 1133   CREATININE 0.94 12/22/2021 1133   CREATININE 0.90 01/14/2020 0852   CALCIUM 10.0 12/22/2021 1133   PROT 7.3 12/22/2021 1133   ALBUMIN 4.2 12/22/2021 1133   AST 13 (L) 12/22/2021 1133   ALT 15 12/22/2021 1133   ALKPHOS 137 (H) 12/22/2021 1133   BILITOT 0.6 12/22/2021 1133   GFRNONAA >60 12/22/2021 1133   GFRAA >60 09/21/2019 0627   GFRAA >60 10/25/2017 1055    No results found for: "TOTALPROTELP", "ALBUMINELP", "A1GS", "A2GS", "BETS", "BETA2SER", "GAMS", "MSPIKE", "SPEI"  No results found for: "KPAFRELGTCHN", "LAMBDASER", "KAPLAMBRATIO"  Lab Results  Component Value Date   WBC 8.0 12/22/2021   NEUTROABS 4.9 12/22/2021   HGB 12.7 12/22/2021   HCT 39.6 12/22/2021   MCV 85.0 12/22/2021   PLT 309 12/22/2021   No results found for: "LABCA2"  No components found for:  "YWVPXT062"  No results for input(s): "INR" in the last 168 hours.  No results found for: "LABCA2"  No results found for: "IRS854"  No results found for: "CAN125"  No results found for: "CAN153"  No results found for: "CA2729"  No components found for: "HGQUANT"  No results found for: "CEA1", "CEA" / No results found for: "CEA1", "CEA"   No results found for: "AFPTUMOR"  No results found for: "CHROMOGRNA"  No results found for: "TOTALPROTELP", "ALBUMINELP", "A1GS", "A2GS", "BETS", "BETA2SER", "GAMS", "MSPIKE", "SPEI" (this displays SPEP labs)  No results found for: "KPAFRELGTCHN", "LAMBDASER", "KAPLAMBRATIO" (kappa/lambda light chains)  No results found for: "HGBA", "HGBA2QUANT", "HGBFQUANT", "HGBSQUAN" (Hemoglobinopathy evaluation)   No results found for: "LDH"  No results found for: "IRON", "TIBC", "IRONPCTSAT" (Iron and TIBC)  No results found for: "FERRITIN"  Urinalysis    Component Value Date/Time   COLORURINE YELLOW 11/18/2021 1509   APPEARANCEUR CLOUDY (A) 11/18/2021 1509   LABSPEC 1.010 11/18/2021 1509   PHURINE 6.5 11/18/2021 1509   GLUCOSEU NEGATIVE 11/18/2021 1509   HGBUR TRACE (A) 11/18/2021 1509   BILIRUBINUR neg 02/01/2021 1458   KETONESUR NEGATIVE 11/18/2021 1509   PROTEINUR NEGATIVE 11/18/2021 1509   UROBILINOGEN 0.2 02/01/2021 1458   NITRITE neg 02/01/2021 1458   LEUKOCYTESUR Negative 02/01/2021 1458    STUDIES: No results found.   ELIGIBLE FOR AVAILABLE RESEARCH PROTOCOL: No   ASSESSMENT: 66 y.o. [place], [place] woman status post left breast upper outer quadrant and left axillary lymph node biopsy 08/07/2017, both positive for a T2 N1, stage IIB invasive ductal carcinoma, grade 3, estrogen receptor weakly positive, progesterone receptor negative, but HER-2 amplified, with an MIB-1 of 80%  (a) staging chest CT and bone scan 08/30/2017 showed no evidence of metastatic disease  (1) neoadjuvant chemotherapy will consist of carboplatin,  docetaxel, trastuzumab, and Pertuzumab given every 21 days x 6 starting 09/07/2017, perjeta omitted with cycle 2 and 3 due to diarrhea  (a) Gemcitabine substituted for Docetaxel beginning  with cycle 5 and 6 for concerns of neuropathy  (2) trastuzumab continued to complete 6 months, last dose 04/06/2018  (a) echocardiogram 08/28/2017 showed an ejection fraction in the 60-65% range.  (b) echocardiogram on 11/29/2017 showed an EF of 55-60%  (c) echocardiogram 03/07/2018 showed an ejection fraction in the 55-60% range  (3) Left lumpectomy on 01/22/2018 shows a ypT1b pN1a, grade 3 residual invasive ductal carcinoma, agnostic panel HER-2 negative, ER 40% weakly positive, PR negative.   (a) foundation one testing requested on 02/02/2018  (b) total of 11 axillary lymph nodes removed (1+)  (4) adjuvant radiation completed 04/20/2018  (5) started anastrozole 05/30/2018  (a) bone density 10/25/2016 normal with a T score of -0.7  (b) bone density 11/28/2019 shows a T score of -1.5  (6) status post total thyroidectomy 09/20/2019 for papillary thyroid carcinoma, pT1b pNX, with negative margins   PLAN:  She is tolerating anastrozole reasonably well.  She denies any intolerable adverse effects.  Physical examination today bilateral breasts inspected, no palpable masses or regional adenopathy. With regards to her thyroid cancer, we have discussed that this is unrelated to her breast cancer, she is to continue levothyroxine replacement and follow-up with her endocrinologist as instructed. She will have mammogram again, currently scheduled in December.  We have briefly discussed about considering antiestrogen therapy for 5 years since this appears to be primarily HER2 driven tumor before neoadjuvant therapy. Labs reviewed, mild elevation of alkaline phosphatase noted which is a chronic finding, no change since the past 2 years hence I would not regard this asa worrisome concern.  I have clearly explained this  to the patient.  We will continue to monitor it annually. Return to clinic in 1 year or sooner as needed. Total time spent: 30 min *Total Encounter Time as defined by the Centers for Medicare and Medicaid Services includes, in addition to the face-to-face time of a patient visit (documented in the note above) non-face-to-face time: obtaining and reviewing outside history, ordering and reviewing medications, tests or procedures, care coordination (communications with other health care professionals or caregivers) and documentation in the medical record.

## 2022-01-04 ENCOUNTER — Ambulatory Visit (INDEPENDENT_AMBULATORY_CARE_PROVIDER_SITE_OTHER): Payer: Medicare Other | Admitting: Obstetrics and Gynecology

## 2022-01-04 ENCOUNTER — Encounter: Payer: Self-pay | Admitting: Obstetrics and Gynecology

## 2022-01-04 VITALS — BP 104/64 | HR 65 | Resp 20 | Ht 67.91 in | Wt 178.0 lb

## 2022-01-04 DIAGNOSIS — N761 Subacute and chronic vaginitis: Secondary | ICD-10-CM | POA: Diagnosis not present

## 2022-01-04 DIAGNOSIS — N952 Postmenopausal atrophic vaginitis: Secondary | ICD-10-CM

## 2022-01-04 MED ORDER — NONFORMULARY OR COMPOUNDED ITEM: 11 refills | Status: DC

## 2022-01-04 NOTE — Patient Instructions (Addendum)
Josph Macho touch is laser treatment for vaginal atrophy.  I would need to refer you to an office that performs this.  If you were to be on Tamoxifen treatment instead of the Anastrozole, you may be a candidate for vaginal estrogen treatment for atrophy of the urogenital tract.

## 2022-01-04 NOTE — Progress Notes (Unsigned)
GYNECOLOGY  VISIT   HPI: 66 y.o.   Single  African American  female   [phone] with No LMP recorded (lmp unknown). Patient has had a hysterectomy.   here for follow-up of vaginitis, no changes.   Used hydrocortisone cream for one month.   Using Vagisil with sex.   Using Arimidex for her breast cancer care for one more year through medical ongology.   Working out.  GYNECOLOGIC HISTORY: No LMP recorded (lmp unknown). Patient has had a hysterectomy. Contraception:  Hysterectomy Menopausal hormone therapy:  None Last mammogram:  01/27/21, Birads 2 benign Last pap smear:   Pap:  11/26/2020 NEG:neg HR HPV, 10-23-18 Neg:Neg HR HPV, 08-10-17 ASCUS:Neg HR HPV          OB History     Gravida  2   Para  2   Term  0   Preterm  0   AB  0   Living  2      SAB  0   IAB  0   Ectopic  0   Multiple  0   Live Births  0              Patient Active Problem List   Diagnosis Date Noted   Papillary thyroid carcinoma ([place]) 04/07/2020   Post-operative hypothyroidism 10/02/2019   Neoplasm of uncertain behavior of thyroid gland 09/16/2019   Multiple thyroid nodules 09/16/2019   COVID-19 virus infection 02/20/2019   Environmental allergies 01/12/2018   Port-A-Cath in place 11/09/2017   Malignant neoplasm of upper-outer quadrant of left breast in female, estrogen receptor positive ([place]) 08/10/2017    Past Medical History:  Diagnosis Date   Abnormal Pap smear of vagina 07/28/2016   LGSIL; colpo 07/2016 atrophic squamous cells; colpo 07/2017 - atypia   Allergy    Arthritis    Breast cancer ([place]) 2018   Metastatic Left breast   Elevated hemoglobin A1c 07/28/2016   level - 6.1   Endometriosis    GERD (gastroesophageal reflux disease)    HSV-2 infection    Iron deficiency anemia    Low vitamin D level 07/28/2016   level 24.7   Personal history of chemotherapy    finished 10/19   Personal history of radiation therapy    finished 03/2018   Thyroid cancer ([place])    Thyroid  neoplasm     Past Surgical History:  Procedure Laterality Date   ABDOMINAL HYSTERECTOMY     BREAST BIOPSY Left 08/09/2017   BREAST LUMPECTOMY Left 01/22/2018   BREAST LUMPECTOMY WITH RADIOACTIVE SEED AND AXILLARY LYMPH NODE DISSECTION Left 01/22/2018   Procedure: LEFT BREAST LUMPECTOMY WITH RADIOACTIVE SEED AND COMPLETE LEFT AXILLARY LYMPH NODE DISSECTION;  Surgeon: [name], MD;  Location: [facility];  Service: General;  Laterality: Left;   CESAREAN SECTION     COLON SURGERY     COLOSTOMY     COLOSTOMY REVERSAL     FOOT SURGERY Right    done spur   OOPHORECTOMY     PORT-A-CATH REMOVAL Right 08/28/2018   Procedure: REMOVAL PORT-A-CATH;  Surgeon: [name], MD;  Location: [facility];  Service: General;  Laterality: Right;   PORTACATH PLACEMENT N/A 09/06/2017   Procedure: INSERTION PORT-A-CATH;  Surgeon: [name], MD;  Location: [facility];  Service: General;  Laterality: N/A;   SMALL INTESTINE SURGERY     SPINE SURGERY     Injections   THYROIDECTOMY N/A 09/20/2019   Procedure: TOTAL THYROIDECTOMY;  Surgeon: [name],  MD;  Location: [facility];  Service: General;  Laterality: N/A;   TUBAL LIGATION  1995    Current Outpatient Medications  Medication Sig Dispense Refill   anastrozole (ARIMIDEX) 1 MG tablet Take 1 tablet (1 mg total) by mouth daily. 90 tablet 14   Artificial Tear Solution (OPTI-TEARS OP) Place 1 drop into both eyes 3 (three) times daily as needed (for dry/irritated eyes.).     Ascorbic Acid (VITAMIN C) 100 MG CHEW Chew 100 mg by mouth daily.      BORIC ACID EX Apply topically.      Cholecalciferol 25 MCG (1000 UT) tablet Take by mouth.     fluticasone (FLONASE) 50 MCG/ACT nasal spray SPRAY 2 SPRAYS INTO EACH NOSTRIL EVERY DAY 16 mL 2   ibuprofen (ADVIL) 800 MG tablet Take 800 mg by mouth 3 (three) times daily as needed.     levothyroxine (SYNTHROID) 100 MCG tablet Take 1 tablet (100 mcg total) by mouth daily. 90 tablet 3    lidocaine (XYLOCAINE) 5 % ointment Apply 1 Application topically 3 (three) times daily. Uses as needed. 1.25 g 1   Multiple Vitamins-Minerals (VITAMIN D3 COMPLETE PO) Take 1 tablet by mouth daily.      naproxen sodium (ALEVE) 220 MG tablet Take 220 mg by mouth 2 (two) times daily as needed (FOR PAIN.).     NONFORMULARY OR COMPOUNDED ITEM Hydrocortisone 10% Vaginal Cream Insert 3 grams into vagina qhs x 4 weeks 84 each 0   Probiotic Product (ALIGN PO) Take by mouth.      sulfamethoxazole-trimethoprim (BACTRIM DS) 800-160 MG tablet Take 1 tablet by mouth 2 (two) times daily. One PO BID x 3 days 6 tablet 0   valACYclovir (VALTREX) 500 MG tablet Take 1 tablet (500 mg total) by mouth daily. Take one tablet (500 mg) twice a day for three days as needed. 36 tablet 11   No current facility-administered medications for this visit.     ALLERGIES: Penicillins, Bee venom, and Tramadol  Family History  Problem Relation Age of Onset   Mental illness Mother    Alcoholism Mother    Cirrhosis Mother    Alcohol abuse Mother    Cancer Father    Lung cancer Father     Social History   Socioeconomic History   Marital status: Single    Spouse name: Not on file   Number of children: 2   Years of education: Not on file   Highest education level: Not on file  Occupational History   Not on file  Tobacco Use   Smoking status: Never   Smokeless tobacco: Never  Vaping Use   Vaping Use: Never used  Substance and Sexual Activity   Alcohol use: Yes    Alcohol/week: 1.0 standard drink of alcohol    Types: 1 Glasses of wine per week    Comment: Social   Drug use: Not Currently    Types: Marijuana    Comment: everyday   Sexual activity: Not Currently    Birth control/protection: Post-menopausal, Surgical    Comment: Hysterectomy  Other Topics Concern   Not on file  Social History Narrative   Retired from [employer] -- retiring next April   Has children    Social Determinants of  Health   Financial Resource Strain: Low Risk  (07/02/2021)   Overall Financial Resource Strain (CARDIA)    Difficulty of Paying Living Expenses: Not hard at all  Food Insecurity: No Food Insecurity (07/02/2021)  Hunger Vital Sign    Worried About Running Out of Food in the Last Year: Never true    Ran Out of Food in the Last Year: Never true  Transportation Needs: No Transportation Needs (07/02/2021)   PRAPARE - Hydrologist (Medical): No    Lack of Transportation (Non-Medical): No  Physical Activity: Inactive (07/02/2021)   Exercise Vital Sign    Days of Exercise per Week: 0 days    Minutes of Exercise per Session: 0 min  Stress: No Stress Concern Present (07/02/2021)   [place]    Feeling of Stress : Not at all  Social Connections: Moderately Integrated (07/02/2021)   Social Connection and Isolation Panel [NHANES]    Frequency of Communication with Friends and Family: More than three times a week    Frequency of Social Gatherings with Friends and Family: More than three times a week    Attends Religious Services: More than 4 times per year    Active Member of [employer] or Organizations: Yes    Attends Archivist Meetings: 1 to 4 times per year    Marital Status: Never married  Intimate Partner Violence: Not At Risk (07/02/2021)   Humiliation, Afraid, Rape, and Kick questionnaire    Fear of Current or Ex-Partner: No    Emotionally Abused: No    Physically Abused: No    Sexually Abused: No    Review of Systems  Genitourinary:        Vaginal irritation and burning    PHYSICAL EXAMINATION:    Ht 5' 7.91" (1.725 m)   Wt 178 lb (80.7 kg)   LMP  (LMP Unknown)   BMI 27.13 kg/m     General appearance: alert, cooperative and appears stated age Head: Normocephalic, without obvious abnormality, atraumatic Neck: no adenopathy, supple, symmetrical, trachea midline and thyroid normal to  inspection and palpation Lungs: clear to auscultation bilaterally Breasts: normal appearance, no masses or tenderness, No nipple retraction or dimpling, No nipple discharge or bleeding, No axillary or supraclavicular adenopathy Heart: regular rate and rhythm Abdomen: soft, non-tender, no masses,  no organomegaly Extremities: extremities normal, atraumatic, no cyanosis or edema Skin: Skin color, texture, turgor normal. No rashes or lesions Lymph nodes: Cervical, supraclavicular, and axillary nodes normal. No abnormal inguinal nodes palpated Neurologic: Grossly normal  Pelvic: External genitalia:  no lesions              Urethra:  normal appearing urethra with no masses, tenderness or lesions              Bartholins and Skenes: normal                 Vagina: normal appearing vagina with normal color and discharge, no lesions... no redness or discharge.               Cervix: absent                Bimanual Exam:  Uterus:  normal size, contour, position, consistency, mobility, non-tender              Adnexa: no mass, fullness, tenderness              Rectal exam: {yes no:314532}.  Confirms.              Anus:  normal sphincter tone, no lesions  Chaperone was present for exam:  ***  ASSESSMENT  Erythema resolved.  PLAN  Rx vit E suppository 200 mcg pv twice weekly.  #8, RF 11.  I told patient to consider Vagifem if she is on Tamoxifen.   An After Visit Summary was printed and given to the patient.  ______ minutes face to face time of which over 50% was spent in counseling.

## 2022-01-06 ENCOUNTER — Other Ambulatory Visit: Payer: Self-pay

## 2022-01-06 ENCOUNTER — Telehealth: Payer: Self-pay | Admitting: Physician Assistant

## 2022-01-06 MED ORDER — SCOPOLAMINE 1 MG/3DAYS TD PT72: 1.0000 | MEDICATED_PATCH | TRANSDERMAL | 1 refills | Status: DC

## 2022-01-06 NOTE — Telephone Encounter (Signed)
LMOVM advising patient that patches have been sent to pharmacy

## 2022-01-06 NOTE — Telephone Encounter (Signed)
Pt states: -leaving for a cruise on 01/15/22 -she is a little worried about motion sickness.   Pt requests: -PCP team to send Rx for a patch to wear during her trip.   [facility]erred Pharmacy: [id number] [place], [id number]: [phone] Fax: [phone] #: [id number]

## 2022-01-06 NOTE — Telephone Encounter (Signed)
Please advise 

## 2022-01-10 NOTE — Telephone Encounter (Signed)
Patient called said that she cannot afford the 10 patches. Is there any way we can reduce the number to 5 instead. Patient stated that she will probably only use them while going and coming back if needed. This is for her cruise.

## 2022-01-11 MED ORDER — SCOPOLAMINE 1 MG/3DAYS TD PT72: 1.0000 | MEDICATED_PATCH | TRANSDERMAL | 0 refills | Status: DC

## 2022-01-11 NOTE — Telephone Encounter (Signed)
Spoke to pt told her I can send in 5 patches if that is more affordable for you. Told her insurance is requiring prior authorization so they will not cover. Pt verbalized understanding and said to send in 4 patches. Told her okay will send new script to pharmacy.

## 2022-01-11 NOTE — Addendum Note (Signed)
Addended by: Marian Sorrow on: 01/11/2022 11:38 AM   Modules accepted: Orders

## 2022-01-26 ENCOUNTER — Other Ambulatory Visit: Payer: Self-pay

## 2022-01-26 MED ORDER — ANASTROZOLE 1 MG PO TABS: 1.0000 mg | ORAL_TABLET | Freq: Every day | ORAL | 4 refills | Status: DC

## 2022-01-26 NOTE — Telephone Encounter (Signed)
Pt called to request refill on anastrozole. Filled per MD.

## 2022-01-28 ENCOUNTER — Ambulatory Visit
Admission: RE | Admit: 2022-01-28 | Discharge: 2022-01-28 | Disposition: A | Discharge status: Home / Self Care | Payer: Medicare Other | Source: Ambulatory Visit | Attending: Hematology and Oncology | Admitting: Hematology and Oncology

## 2022-01-28 DIAGNOSIS — Z1231 Encounter for screening mammogram for malignant neoplasm of breast: Secondary | ICD-10-CM

## 2022-01-31 ENCOUNTER — Encounter: Payer: Self-pay | Admitting: Physician Assistant

## 2022-01-31 ENCOUNTER — Ambulatory Visit (INDEPENDENT_AMBULATORY_CARE_PROVIDER_SITE_OTHER): Payer: Medicare Other | Admitting: Physician Assistant

## 2022-01-31 VITALS — BP 136/80 | HR 65 | Temp 97.8°F | Ht 68.0 in | Wt 184.2 lb

## 2022-01-31 DIAGNOSIS — C73 Malignant neoplasm of thyroid gland: Secondary | ICD-10-CM | POA: Diagnosis not present

## 2022-01-31 DIAGNOSIS — Z23 Encounter for immunization: Secondary | ICD-10-CM | POA: Diagnosis not present

## 2022-01-31 DIAGNOSIS — E663 Overweight: Secondary | ICD-10-CM

## 2022-01-31 DIAGNOSIS — R739 Hyperglycemia, unspecified: Secondary | ICD-10-CM | POA: Diagnosis not present

## 2022-01-31 DIAGNOSIS — C50919 Malignant neoplasm of unspecified site of unspecified female breast: Secondary | ICD-10-CM | POA: Diagnosis not present

## 2022-01-31 DIAGNOSIS — E785 Hyperlipidemia, unspecified: Secondary | ICD-10-CM

## 2022-01-31 DIAGNOSIS — Z0000 Encounter for general adult medical examination without abnormal findings: Secondary | ICD-10-CM

## 2022-01-31 LAB — COMPREHENSIVE METABOLIC PANEL
ALT: 16 U/L (ref 0–35)
AST: 15 U/L (ref 0–37)
Albumin: 4.5 g/dL (ref 3.5–5.2)
Alkaline Phosphatase: 134 U/L — ABNORMAL HIGH (ref 39–117)
BUN: 13 mg/dL (ref 6–23)
CO2: 26 mEq/L (ref 19–32)
Calcium: 10.1 mg/dL (ref 8.4–10.5)
Chloride: 105 mEq/L (ref 96–112)
Creatinine, Ser: 0.84 mg/dL (ref 0.40–1.20)
GFR: 72.32 mL/min (ref >60.00)
Glucose, Bld: 86 mg/dL (ref 70–99)
Potassium: 4 mEq/L (ref 3.5–5.1)
Sodium: 138 mEq/L (ref 135–145)
Total Bilirubin: 0.8 mg/dL (ref 0.2–1.2)
Total Protein: 7.3 g/dL (ref 6.0–8.3)

## 2022-01-31 LAB — CBC WITH DIFFERENTIAL/PLATELET
Basophils Absolute: 0 10*3/uL (ref 0.0–0.1)
Basophils Relative: 0.7 % (ref 0.0–3.0)
Eosinophils Absolute: 0.1 10*3/uL (ref 0.0–0.7)
Eosinophils Relative: 2 % (ref 0.0–5.0)
HCT: 39.4 % (ref 36.0–46.0)
Hemoglobin: 12.9 g/dL (ref 12.0–15.0)
Lymphocytes Relative: 31.9 % (ref 12.0–46.0)
Lymphs Abs: 2.2 10*3/uL (ref 0.7–4.0)
MCHC: 32.7 g/dL (ref 30.0–36.0)
MCV: 84.3 fl (ref 78.0–100.0)
Monocytes Absolute: 0.4 10*3/uL (ref 0.1–1.0)
Monocytes Relative: 6.1 % (ref 3.0–12.0)
Neutro Abs: 4.1 10*3/uL (ref 1.4–7.7)
Neutrophils Relative %: 59.3 % (ref 43.0–77.0)
Platelets: 312 10*3/uL (ref 150.0–400.0)
RBC: 4.68 Mil/uL (ref 3.87–5.11)
RDW: 15.7 % — ABNORMAL HIGH (ref 11.5–15.5)
WBC: 6.9 10*3/uL (ref 4.0–10.5)

## 2022-01-31 LAB — LIPID PANEL
Cholesterol: 187 mg/dL (ref 0–200)
HDL: 54.8 mg/dL (ref >39.00)
LDL Cholesterol: 106 mg/dL — ABNORMAL HIGH (ref 0–99)
NonHDL: 131.99
Total CHOL/HDL Ratio: 3
Triglycerides: 132 mg/dL (ref 0.0–149.0)
VLDL: 26.4 mg/dL (ref 0.0–40.0)

## 2022-01-31 LAB — HEMOGLOBIN A1C: Hgb A1c MFr Bld: 6.1 % (ref 4.6–6.5)

## 2022-01-31 NOTE — Addendum Note (Signed)
Addended by: Marian Sorrow on: 01/31/2022 11:44 AM   Modules accepted: Orders

## 2022-01-31 NOTE — Progress Notes (Signed)
Subjective:    Madison Hopkins is a 65 y.o. female and is here for a comprehensive physical exam.  HPI  Health Maintenance Due  Topic Date Due   Zoster Vaccines- Shingrix (1 of 2) Never done   Pneumonia Vaccine 84+ Years old (1 - PCV) Never done   INFLUENZA VACCINE  09/28/2021   COVID-19 Vaccine (5 - 2023-24 season) 10/29/2021   DEXA SCAN  11/27/2021   MAMMOGRAM  01/27/2022    Acute Concerns: None.  Chronic Issues: Hx of Breast Cancer  Currently Madison Hopkins is compliant with anastrozole with fair tolerance. She is regularly seeing Dr. Chryl Heck, oncology and managing well.    Hx of Thyroid Cancer  Currently she is following up regularly with Dr. Kelton Pillar about this issue. She is managing well. Currently compliant with synthroid 125 mcg daily with no adverse effects.     Hyperglycemia She is eating healthy and exercising regularly  Patient states she has seen some weight-loss. Denies polyuria/polydipsia.   Health Maintenance: Immunizations -- UTD, Pneumonia and flu vaccine 01/31/2022  Colonoscopy -- Cologuard 01/21/20 Mammogram -- 01/28/2022 PAP -- 11/26/20 Bone Density -- 11/28/19 Diet -- Well balanced diet  Exercise -- Does weight lifting at the gym and walks a lot  Sleep habits -- Sleeps well  Mood -- Good  UTD with dentist? - Yes UTD with eye doctor? - Yes  Weight history: Wt Readings from Last 10 Encounters:  01/04/22 178 lb (80.7 kg)  12/22/21 181 lb 1.6 oz (82.1 kg)  11/29/21 183 lb (83 kg)  11/18/21 191 lb (86.6 kg)  11/11/21 188 lb (85.3 kg)  05/06/21 199 lb (90.3 kg)  02/01/21 195 lb 3.2 oz (88.5 kg)  12/10/20 192 lb (87.1 kg)  11/26/20 191 lb (86.6 kg)  10/27/20 190 lb (86.2 kg)   There is no height or weight on file to calculate BMI. No LMP recorded (lmp unknown). Patient has had a hysterectomy.  Alcohol use:  reports current alcohol use of about 1.0 standard drink of alcohol per week.  Tobacco use:  Tobacco Use: Low Risk  (01/28/2022)    Patient History    Smoking Tobacco Use: Never    Smokeless Tobacco Use: Never    Passive Exposure: Not on file   Eligible for lung cancer screening? no     07/02/2021    1:25 PM  Depression screen PHQ 2/9  Decreased Interest 0  Down, Depressed, Hopeless 0  PHQ - 2 Score 0     Other providers/specialists: Patient Care Team: [name], [place] as PCP - General (Physician Assistant) [name], [name] Dad, MD (Inactive) as Consulting Physician (Oncology) Nunzio Cobbs, MD as Consulting Physician (Obstetrics and Gynecology) Bensimhon, [name], MD as Consulting Physician (Cardiology) [name], MD as Attending Physician (Physical Medicine and Rehabilitation) [facility], [name], MD as Consulting Physician (Endocrinology) [name], MD as Consulting Physician (General Surgery)    PMHx, SurgHx, SocialHx, Medications, and Allergies were reviewed in the Visit Navigator and updated as appropriate.   Past Medical History:  Diagnosis Date   Abnormal Pap smear of vagina 07/28/2016   LGSIL; colpo 07/2016 atrophic squamous cells; colpo 07/2017 - atypia   Allergy    Arthritis    Breast cancer ([place]) 2018   Metastatic Left breast   Elevated hemoglobin A1c 07/28/2016   level - 6.1   Endometriosis    GERD (gastroesophageal reflux disease)    HSV-2 infection    Iron deficiency anemia    Low  vitamin D level 07/28/2016   level 24.7   Personal history of chemotherapy    finished 10/19   Personal history of radiation therapy    finished 03/2018   Thyroid cancer [facility])    Thyroid neoplasm      Past Surgical History:  Procedure Laterality Date   ABDOMINAL HYSTERECTOMY     BREAST BIOPSY Left 08/09/2017   BREAST LUMPECTOMY Left 01/22/2018   BREAST LUMPECTOMY WITH RADIOACTIVE SEED AND AXILLARY LYMPH NODE DISSECTION Left 01/22/2018   Procedure: LEFT BREAST LUMPECTOMY WITH RADIOACTIVE SEED AND COMPLETE LEFT AXILLARY LYMPH NODE DISSECTION;  Surgeon: [name], MD;   Location: [facility];  Service: General;  Laterality: Left;   CESAREAN SECTION     COLON SURGERY     COLOSTOMY     COLOSTOMY REVERSAL     FOOT SURGERY Right    done spur   OOPHORECTOMY     PORT-A-CATH REMOVAL Right 08/28/2018   Procedure: REMOVAL PORT-A-CATH;  Surgeon: [name], MD;  Location: [facility];  Service: General;  Laterality: Right;   PORTACATH PLACEMENT N/A 09/06/2017   Procedure: INSERTION PORT-A-CATH;  Surgeon: [name], MD;  Location: [facility];  Service: General;  Laterality: N/A;   SMALL INTESTINE SURGERY     SPINE SURGERY     Injections   THYROIDECTOMY N/A 09/20/2019   Procedure: TOTAL THYROIDECTOMY;  Surgeon: [name], MD;  [facility];  Service: General;  Laterality: N/A;   TUBAL LIGATION  1995     Family History  Problem Relation Age of Onset   Mental illness Mother    Alcoholism Mother    Cirrhosis Mother    Alcohol abuse Mother    Cancer Father    Lung cancer Father     Social History   Tobacco Use   Smoking status: Never   Smokeless tobacco: Never  Vaping Use   Vaping Use: Never used  Substance Use Topics   Alcohol use: Yes    Alcohol/week: 1.0 standard drink of alcohol    Types: 1 Glasses of wine per week    Comment: Social   Drug use: Not Currently    Types: Marijuana    Comment: everyday    Review of Systems:   Review of Systems  Constitutional:  Negative for chills, fever, malaise/fatigue and weight loss.  HENT:  Negative for hearing loss, sinus pain and sore throat.   Respiratory:  Negative for cough and hemoptysis.   Cardiovascular:  Negative for chest pain, palpitations, leg swelling and PND.  Gastrointestinal:  Negative for abdominal pain, constipation, diarrhea, heartburn, nausea and vomiting.  Genitourinary:  Negative for dysuria, frequency and urgency.  Musculoskeletal:  Negative for back pain, myalgias and neck pain.  Skin:  Negative for itching and rash.  Neurological:   Negative for dizziness, tingling, seizures and headaches.  Endo/Heme/Allergies:  Negative for polydipsia.  Psychiatric/Behavioral:  Negative for depression. The patient is not nervous/anxious.     Objective:   LMP  (LMP Unknown)  There is no height or weight on file to calculate BMI.   General Appearance:    Alert, cooperative, no distress, appears stated age  Head:    Normocephalic, without obvious abnormality, atraumatic  Eyes:    PERRL, conjunctiva/corneas clear, EOM's intact, fundi    benign, both eyes  Ears:    Normal TM's and external ear canals, both ears  Nose:   Nares normal, septum midline, mucosa normal, no drainage    or sinus tenderness  Throat:   Lips, mucosa, and tongue normal; teeth and gums normal  Neck:   Supple, symmetrical, trachea midline, no adenopathy;    thyroid:  no enlargement/tenderness/nodules; no carotid   bruit or JVD  Back:     Symmetric, no curvature, ROM normal, no CVA tenderness  Lungs:     Clear to auscultation bilaterally, respirations unlabored  Chest Wall:    No tenderness or deformity   Heart:    Regular rate and rhythm, S1 and S2 normal, no murmur, rub or gallop  Breast Exam:    Deferred  Abdomen:     Soft, non-tender, bowel sounds active all four quadrants,    no masses, no organomegaly  Genitalia:    Deferred  Extremities:   Extremities normal, atraumatic, no cyanosis or edema  Pulses:   2+ and symmetric all extremities  Skin:   Skin color, texture, turgor normal, no rashes or lesions  Lymph nodes:   Cervical, supraclavicular, and axillary nodes normal  Neurologic:   CNII-XII intact, normal strength, sensation and reflexes    throughout    Assessment/Plan:   Routine physical examination Today patient counseled on age appropriate routine health concerns for screening and prevention, each reviewed and up to date or declined. Immunizations reviewed and up to date or declined. Labs ordered and reviewed. Risk factors for depression reviewed  and negative. Hearing function and visual acuity are intact. ADLs screened and addressed as needed. Functional ability and level of safety reviewed and appropriate. Education, counseling and referrals performed based on assessed risks today. Patient provided with a copy of personalized plan for preventive services.  Papillary thyroid carcinoma ([place]) Compliant with endo care  Malignant neoplasm of female breast, unspecified estrogen receptor status, unspecified laterality, unspecified site of breast ([place]) Compliant with onc care  Hyperglycemia Recheck A1c and provide recommendations accordingly  Hyperlipidemia, unspecified hyperlipidemia type Update lipid panel and make recommendations accordingly  Overweight Continue healthy lifestyle efforts If weight becomes unintentional, she is to let us know  I,Moesha Myer,acting as a scribe for [employer], PA.,have documented all relevant documentation on the behalf of [name], PA,as directed by  [name], PA while in the presence of [name], [place].  I, [name], [place], have reviewed all documentation for this visit. The documentation on 01/31/22 for the exam, diagnosis, procedures, and orders are all accurate and complete.  [name] PA-C

## 2022-01-31 NOTE — Patient Instructions (Addendum)
It was great to see you!  Keep an eye on your weight and let us know if it continues to drop. Message me in January if you need referral for therapy.  Please go to the lab for blood work.   Our office will call you with your results unless you have chosen to receive results via MyChart.  If your blood work is normal we will follow-up each year for physicals and as scheduled for chronic medical problems.  If anything is abnormal we will treat accordingly and get you in for a follow-up.  Take care,  Aldona Bar

## 2022-02-24 ENCOUNTER — Other Ambulatory Visit: Payer: Self-pay | Admitting: Physician Assistant

## 2022-02-24 DIAGNOSIS — E890 Postprocedural hypothyroidism: Secondary | ICD-10-CM

## 2022-02-24 DIAGNOSIS — C73 Malignant neoplasm of thyroid gland: Secondary | ICD-10-CM

## 2022-02-24 DIAGNOSIS — Z170 Estrogen receptor positive status [ER+]: Secondary | ICD-10-CM

## 2022-02-28 DIAGNOSIS — M810 Age-related osteoporosis without current pathological fracture: Secondary | ICD-10-CM

## 2022-02-28 HISTORY — DX: Age-related osteoporosis without current pathological fracture: M81.0

## 2022-03-01 ENCOUNTER — Other Ambulatory Visit: Payer: Self-pay | Admitting: Physician Assistant

## 2022-03-02 ENCOUNTER — Encounter: Payer: Self-pay | Admitting: Oncology

## 2022-03-02 ENCOUNTER — Other Ambulatory Visit: Payer: Medicare Other

## 2022-03-02 ENCOUNTER — Ambulatory Visit
Admission: RE | Admit: 2022-03-02 | Discharge: 2022-03-02 | Disposition: A | Discharge status: Home / Self Care | Payer: PPO | Source: Ambulatory Visit | Attending: Physician Assistant | Admitting: Physician Assistant

## 2022-03-02 DIAGNOSIS — M810 Age-related osteoporosis without current pathological fracture: Secondary | ICD-10-CM | POA: Diagnosis not present

## 2022-03-02 DIAGNOSIS — Z170 Estrogen receptor positive status [ER+]: Secondary | ICD-10-CM

## 2022-03-02 DIAGNOSIS — M8589 Other specified disorders of bone density and structure, multiple sites: Secondary | ICD-10-CM | POA: Diagnosis not present

## 2022-03-02 DIAGNOSIS — Z780 Asymptomatic menopausal state: Secondary | ICD-10-CM | POA: Diagnosis not present

## 2022-03-02 DIAGNOSIS — C73 Malignant neoplasm of thyroid gland: Secondary | ICD-10-CM

## 2022-03-02 DIAGNOSIS — E890 Postprocedural hypothyroidism: Secondary | ICD-10-CM

## 2022-03-07 ENCOUNTER — Encounter: Payer: Self-pay | Admitting: Physician Assistant

## 2022-03-07 ENCOUNTER — Telehealth (INDEPENDENT_AMBULATORY_CARE_PROVIDER_SITE_OTHER): Payer: PPO | Admitting: Physician Assistant

## 2022-03-07 ENCOUNTER — Encounter: Payer: Self-pay | Admitting: Oncology

## 2022-03-07 VITALS — Ht 68.0 in | Wt 183.0 lb

## 2022-03-07 DIAGNOSIS — M816 Localized osteoporosis [Lequesne]: Secondary | ICD-10-CM | POA: Diagnosis not present

## 2022-03-07 DIAGNOSIS — E785 Hyperlipidemia, unspecified: Secondary | ICD-10-CM | POA: Diagnosis not present

## 2022-03-07 NOTE — Progress Notes (Signed)
I acted as a Education administrator for [employer], PA-C Anselmo Pickler, LPN  Virtual Visit via Video Note   I Madison Coke, PA , connected with  Madison Hopkins  ([id number], Oct 16, 1955) on 03/07/22 at  9:40 AM EST by a video-enabled telemedicine application and verified that I am speaking with the correct person using two identifiers.  Location: Patient: Home Provider: [place] office   I discussed the limitations of evaluation and management by telemedicine and the availability of in person appointments. The patient expressed understanding and agreed to proceed.    History of Present Illness: Madison Hopkins is a 67 y.o. who identifies as a female who was assigned female at birth, and is being seen today for Bone Denisty results. Pt's visit is to discuss Bone Density results and options. She would also like to discuss her results from physical.  Osteoporosis Has evidence of osteoporosis in her left radius per her DEXA on 03/02/2022. Has osteopenia in her left femur. She is currently not taking a calcium or vitamin D supplement regularly. She is working on increasing her strength bearing activities and weight lifting.  HLD She also has elevated ASCVD and is interested in discussing a Calcium Score test. The 10-year ASCVD risk score (Arnett DK, et al., 2019) is: 9.3%   Values used to calculate the score:     Age: 42 years     Sex: Female     Is Non-Hispanic African American: Yes     Diabetic: No     Tobacco smoker: No     Systolic Blood Pressure: 361 mmHg     Is BP treated: No     HDL Cholesterol: 54.8 mg/dL     Total Cholesterol: 187 mg/dL   Problems:  Patient Active Problem List   Diagnosis Date Noted   History of thyroid cancer 09/15/2020   Papillary thyroid carcinoma ([place]) 04/07/2020   Post-operative hypothyroidism 10/02/2019   S/P total thyroidectomy 10/02/2019   Neoplasm of uncertain behavior of thyroid gland 09/16/2019   Multiple thyroid nodules 09/16/2019    COVID-19 virus infection 02/20/2019   Environmental allergies 01/12/2018   Malignant tumor of breast ([place]) 01/12/2018   Malignant neoplastic disease ([place]) 01/12/2018   Port-A-Cath in place 11/09/2017   Malignant neoplasm of upper-outer quadrant of left breast in female, estrogen receptor positive ([place]) 08/10/2017    Allergies:  Allergies  Allergen Reactions   Penicillins Nausea Only, Hives and Nausea And Vomiting    Has patient had a PCN reaction causing immediate rash, facial/tongue/throat swelling, SOB or lightheadedness with hypotension: No Has patient had a PCN reaction causing severe rash involving mucus membranes or skin necrosis: No Has patient had a PCN reaction that required hospitalization: No Has patient had a PCN reaction occurring within the last 10 years: Yes--nausea & headache ONLY If all of the above answers are "NO", then may proceed with Cephalosporin use.  Has patient had a PCN reaction causing immediate rash, facial/tongue/throat swelling, SOB or lightheadedness with hypotension: No Has patient had a PCN reaction causing severe rash involving mucus membranes or skin necrosis: No Has patient had a PCN reaction that required hospitalization: No Has patient had a PCN reaction occurring within the last 10 years: Yes--nausea & headache ONLY If all of the above answers are "NO", then may proceed with Cephalosporin use. Has patient had a PCN reaction causing immediate rash, facial/tongue/throat swelling, SOB or lightheadedness with hypotension: No Has patient had a PCN reaction causing severe rash involving  mucus membranes or skin necrosis: No Has patient had a PCN reaction that required hospitalization: No Has patient had a PCN reaction occurring within the last 10 years: Yes--nausea & headache ONLY If all of the above answers are "NO", then may proceed with Cephalosporin use.   Bee Venom    Tramadol Nausea Only   Medications:  Current Outpatient Medications:     anastrozole (ARIMIDEX) 1 MG tablet, Take 1 tablet (1 mg total) by mouth daily., Disp: 90 tablet, Rfl: 4   Artificial Tear Solution (OPTI-TEARS OP), Place 1 drop into both eyes 3 (three) times daily as needed (for dry/irritated eyes.)., Disp: , Rfl:    Ascorbic Acid (VITAMIN C) 100 MG CHEW, Chew 100 mg by mouth daily. , Disp: , Rfl:    BORIC ACID EX, Apply topically. , Disp: , Rfl:    fluticasone (FLONASE) 50 MCG/ACT nasal spray, SPRAY 2 SPRAYS INTO EACH NOSTRIL EVERY DAY, Disp: 16 mL, Rfl: 2   ibuprofen (ADVIL) 800 MG tablet, Take 800 mg by mouth 3 (three) times daily as needed., Disp: , Rfl:    levothyroxine (SYNTHROID) 100 MCG tablet, Take 1 tablet (100 mcg total) by mouth daily., Disp: 90 tablet, Rfl: 3   lidocaine (XYLOCAINE) 5 % ointment, Apply 1 Application topically 3 (three) times daily. Uses as needed., Disp: 1.25 g, Rfl: 1   Multiple Vitamins-Minerals (VITAMIN D3 COMPLETE PO), Take 1 tablet by mouth daily. , Disp: , Rfl:    naproxen sodium (ALEVE) 220 MG tablet, Take 220 mg by mouth 2 (two) times daily as needed (FOR PAIN.)., Disp: , Rfl:    Probiotic Product (ALIGN PO), Take by mouth. , Disp: , Rfl:    valACYclovir (VALTREX) 500 MG tablet, Take 1 tablet (500 mg total) by mouth daily. Take one tablet (500 mg) twice a day for three days as needed., Disp: 36 tablet, Rfl: 11  Observations/Objective: Patient is well-developed, well-nourished in no acute distress.  Resting comfortably  at home.  Head is normocephalic, atraumatic.  No labored breathing.  Speech is clear and coherent with logical content.  Patient is alert and oriented at baseline.   Assessment and Plan: 1. Localized osteoporosis without current pathological fracture Reviewed need for adequate calcium and vitamin D Recommend continued weight bearing activities Discussed fosamax 70 mg weekly -- she will look into this   2. Hyperlipidemia, unspecified hyperlipidemia type - CT CARDIAC SCORING ([facility] LOCATIONS ONLY);  Future Discussed CT scan She is agreeable if insurance pays for this I have placed order for [facility] Will use this information to determine whether or not to start statin -- she is hesitant at this time  Follow Up Instructions: I discussed the assessment and treatment plan with the patient. The patient was provided an opportunity to ask questions and all were answered. The patient agreed with the plan and demonstrated an understanding of the instructions.  A copy of instructions were sent to the patient via MyChart unless otherwise noted below.   The patient was advised to call back or seek an in-person evaluation if the symptoms worsen or if the condition fails to improve as anticipated.  Madison Hopkins, [place]

## 2022-03-19 DIAGNOSIS — N952 Postmenopausal atrophic vaginitis: Secondary | ICD-10-CM | POA: Diagnosis not present

## 2022-03-31 ENCOUNTER — Ambulatory Visit
Admission: RE | Admit: 2022-03-31 | Discharge: 2022-03-31 | Disposition: A | Discharge status: Home / Self Care | Payer: PPO | Source: Ambulatory Visit | Attending: Physician Assistant | Admitting: Physician Assistant

## 2022-03-31 DIAGNOSIS — E785 Hyperlipidemia, unspecified: Secondary | ICD-10-CM | POA: Diagnosis not present

## 2022-04-11 ENCOUNTER — Encounter: Payer: Self-pay | Admitting: Physician Assistant

## 2022-04-11 DIAGNOSIS — E663 Overweight: Secondary | ICD-10-CM

## 2022-04-11 DIAGNOSIS — R739 Hyperglycemia, unspecified: Secondary | ICD-10-CM

## 2022-04-13 MED ORDER — ATORVASTATIN CALCIUM 10 MG PO TABS: 10.0000 mg | ORAL_TABLET | Freq: Every day | ORAL | 1 refills | Status: DC

## 2022-04-26 DIAGNOSIS — C50912 Malignant neoplasm of unspecified site of left female breast: Secondary | ICD-10-CM | POA: Diagnosis not present

## 2022-05-12 DIAGNOSIS — M1712 Unilateral primary osteoarthritis, left knee: Secondary | ICD-10-CM | POA: Diagnosis not present

## 2022-05-12 DIAGNOSIS — M170 Bilateral primary osteoarthritis of knee: Secondary | ICD-10-CM | POA: Diagnosis not present

## 2022-05-12 DIAGNOSIS — M1711 Unilateral primary osteoarthritis, right knee: Secondary | ICD-10-CM | POA: Diagnosis not present

## 2022-05-17 ENCOUNTER — Encounter: Payer: Self-pay | Admitting: Internal Medicine

## 2022-05-17 ENCOUNTER — Ambulatory Visit: Payer: PPO | Admitting: Internal Medicine

## 2022-05-17 VITALS — BP 116/74 | HR 80 | Ht 68.0 in | Wt 193.0 lb

## 2022-05-17 DIAGNOSIS — C73 Malignant neoplasm of thyroid gland: Secondary | ICD-10-CM

## 2022-05-17 DIAGNOSIS — E890 Postprocedural hypothyroidism: Secondary | ICD-10-CM

## 2022-05-17 LAB — TSH: TSH: 1.22 u[IU]/mL (ref 0.35–5.50)

## 2022-05-17 NOTE — Progress Notes (Unsigned)
Name: PENELOPI Hopkins  MRN/ DOB: [id number], 1955-10-16    Age/ Sex: 67 y.o., female     PCP: [name], PA   Reason for Endocrinology Evaluation: [facility]      Initial Endocrinology Clinic Visit: 10/02/2019    PATIENT IDENTIFIER: [name] is a 67 y.o., female with a past medical history of Breast Ca and PTC . She has followed with [facility] since 10/02/2019 for consultative assistance with management of her PTC.   HISTORICAL SUMMARY:   Pt has been referred by Dr. [name].  She had an incidental finding of a left thyroid nodule ~1.5 cm in diameter on neck MRI during evaluation of chronic neck pain.  This nodule was noted to enlarge to 2.3 cm max diameter on cervical spine MRI 04/2019.   She is S/P FNA in 06/2019 - with left mid pole cytology report of Suspicious for malignancy (Bethesda category V), no afirma found for this in the chart. And a left inferior pole cytology of Atypia of undetermined significance (Bethesda category III) , with benign afirma     She is S/P Total thyroidectomy  On 7/23rd/ 2021 with a 1.3 cm papillary thyroid carcinoma on the left with no invasion, free margins and NO L.N submitted.   Thyroid ultrasound showed a subcentimeter hypoechoic right thyroid bed focus 0.8 cm by by March 2023 the patient developed a new 0.7 left thyroid bed focus    She is s/p RAI remnant ablation with 53 mCi 07/2021   Posttreatment scan showed right thyroid remnant but no distant metastasis on 08/27/2021   Thyroglobulin remains low at 0.1 NG/mL with undetectable thyroglobulin antibodies  SUBJECTIVE:    Today (05/17/2022):  Ms. [name] is here for total thyroidectomy secondary to PTC.   Pt has been noted with weight gain  Denies tremors  Denies palpitations  Denies local  neck swelling  She has occasional change in BM depending on what she eats  She has twisted her right knee last week , which limited her exercise resulting in weight gain    Levothyroxine 100 mcg daily    HISTORY:  Past Medical History:  Past Medical History:  Diagnosis Date   Abnormal Pap smear of vagina 07/28/2016   LGSIL; colpo 07/2016 atrophic squamous cells; colpo 07/2017 - atypia   Allergy    Arthritis    Breast cancer ([place]) 2018   Metastatic Left breast   Elevated hemoglobin A1c 07/28/2016   level - 6.1   Endometriosis    GERD (gastroesophageal reflux disease)    HSV-2 infection    Iron deficiency anemia    Low vitamin D level 07/28/2016   level 24.7   Personal history of chemotherapy    finished 10/19   Personal history of radiation therapy    finished 03/2018   Thyroid cancer ([place])    Thyroid neoplasm    Past Surgical History:  Past Surgical History:  Procedure Laterality Date   ABDOMINAL HYSTERECTOMY     BREAST BIOPSY Left 08/09/2017   BREAST LUMPECTOMY Left 01/22/2018   BREAST LUMPECTOMY WITH RADIOACTIVE SEED AND AXILLARY LYMPH NODE DISSECTION Left 01/22/2018   Procedure: LEFT BREAST LUMPECTOMY WITH RADIOACTIVE SEED AND COMPLETE LEFT AXILLARY LYMPH NODE DISSECTION;  Surgeon: [name], MD;  Location: [facility];  Service: General;  Laterality: Left;   [place]  Right    done spur   OOPHORECTOMY     PORT-A-CATH REMOVAL Right 08/28/2018   Procedure: REMOVAL PORT-A-CATH;  Surgeon: [name], MD;  Location: [facility];  Service: General;  Laterality: Right;   PORTACATH PLACEMENT N/A 09/06/2017   Procedure: INSERTION PORT-A-CATH;  Surgeon: [name], MD;  Location: [facility];  Service: General;  Laterality: N/A;   SMALL INTESTINE SURGERY     SPINE SURGERY     Injections   THYROIDECTOMY N/A 09/20/2019   Procedure: TOTAL THYROIDECTOMY;  Surgeon: [name], MD;  Location: [facility];  Service: General;  Laterality: N/A;   TUBAL LIGATION  1995   Social History:  reports that she has never smoked. She has never  used smokeless tobacco. She reports current alcohol use of about 1.0 standard drink of alcohol per week. She reports that she does not currently use drugs after having used the following drugs: Marijuana. Family History:  Family History  Problem Relation Age of Onset   Mental illness Mother    Alcoholism Mother    Cirrhosis Mother    Alcohol abuse Mother    Cancer Father    Lung cancer Father      HOME MEDICATIONS: Allergies as of 05/17/2022       Reactions   Penicillins Nausea Only, Hives, Nausea And Vomiting   Has patient had a PCN reaction causing immediate rash, facial/tongue/throat swelling, SOB or lightheadedness with hypotension: No Has patient had a PCN reaction causing severe rash involving mucus membranes or skin necrosis: No Has patient had a PCN reaction that required hospitalization: No Has patient had a PCN reaction occurring within the last 10 years: Yes--nausea & headache ONLY If all of the above answers are "NO", then may proceed with Cephalosporin use. Has patient had a PCN reaction causing immediate rash, facial/tongue/throat swelling, SOB or lightheadedness with hypotension: No Has patient had a PCN reaction causing severe rash involving mucus membranes or skin necrosis: No Has patient had a PCN reaction that required hospitalization: No Has patient had a PCN reaction occurring within the last 10 years: Yes--nausea & headache ONLY If all of the above answers are "NO", then may proceed with Cephalosporin use. Has patient had a PCN reaction causing immediate rash, facial/tongue/throat swelling, SOB or lightheadedness with hypotension: No Has patient had a PCN reaction causing severe rash involving mucus membranes or skin necrosis: No Has patient had a PCN reaction that required hospitalization: No Has patient had a PCN reaction occurring within the last 10 years: Yes--nausea & headache ONLY If all of the above answers are "NO", then may proceed with Cephalosporin use.    Bee Venom    Tramadol Nausea Only        Medication List        Accurate as of May 17, 2022  7:17 AM. If you have any questions, ask your nurse or doctor.          ALIGN PO Take by mouth.   anastrozole 1 MG tablet Commonly known as: ARIMIDEX Take 1 tablet (1 mg total) by mouth daily.   atorvastatin 10 MG tablet Commonly known as: LIPITOR Take 1 tablet (10 mg total) by mouth daily.   BORIC ACID EX Apply topically.   fluticasone 50 MCG/ACT nasal spray Commonly known as: FLONASE SPRAY 2 SPRAYS INTO EACH NOSTRIL EVERY DAY   ibuprofen 800 MG tablet Commonly known as: ADVIL Take 800 mg by mouth 3 (three) times daily as needed.   levothyroxine 100  MCG tablet Commonly known as: SYNTHROID Take 1 tablet (100 mcg total) by mouth daily.   lidocaine 5 % ointment Commonly known as: XYLOCAINE Apply 1 Application topically 3 (three) times daily. Uses as needed.   naproxen sodium 220 MG tablet Commonly known as: ALEVE Take 220 mg by mouth 2 (two) times daily as needed (FOR PAIN.).   OPTI-TEARS OP Place 1 drop into both eyes 3 (three) times daily as needed (for dry/irritated eyes.).   valACYclovir 500 MG tablet Commonly known as: VALTREX Take 1 tablet (500 mg total) by mouth daily. Take one tablet (500 mg) twice a day for three days as needed.   Vitamin C 100 MG Chew Chew 100 mg by mouth daily.   VITAMIN D3 COMPLETE PO Take 1 tablet by mouth daily.          OBJECTIVE:   PHYSICAL EXAM: VS:LMP  (LMP Unknown)    EXAM: General: Pt appears well and is in NAD  Neck: General: Supple without adenopathy. Thyroid: Surgically removed . No nodules appreciated.  Lungs: Clear with good BS bilat with no rales, rhonchi, or wheezes  Heart: Auscultation: RRR.  Abdomen: Normoactive bowel sounds, soft, nontender, without masses or organomegaly palpable  Extremities:  BL LE: No pretibial edema normal ROM and strength.  Mental Status: Judgment, insight:  Intact Orientation: Oriented to time, place, and person Mood and affect: No depression, anxiety, or agitation     DATA REVIEWED: ****  Pathology report 09/20/2019 THYROID, TOTAL THYROIDECTOMY:  - Papillary thyroid carcinoma, 1.3 cm.  - Margins not involved.  - Multiple adenomatous nodules.  - See oncology table.   ONCOLOGY TABLE:  THYROID GLAND:  Procedure: Total thyroidectomy.  Tumor Focality: Unifocal.  Tumor Site: Left lobe, mid.  Tumor Size: 1.3 x 1 x 1 cm.  Histologic Type: Papillary thyroid carcinoma.  Margins: Free of tumor.  Angioinvasion: Not identified.  Lymphatic Invasion: Not identified.  Extrathyroidal extension: Not identified.  Regional Lymph Nodes: No lymph nodes submitted.  Pathologic Stage Classification (pTNM, AJCC 8th Edition): pT1b, pNX   Thyroid Ultrasound 11/12/2021  Significant interval decrease in size and conspicuity of small residual tissue in the right thyroid resection bed which now measures no more than 0.5 cm and is difficult to visualize from the background thyroid tissue. 2. No evidence of residual or recurrent tissue in the left thyroid resection bed.       Post Rx WBS 08/27/2021  FINDINGS: Uptake at thyroid remnant greater on RIGHT.   No additional sites of abnormal radio iodine accumulation identified.   Small amount of excreted tracer within urinary bladder.   IMPRESSION: Small thyroid remnant.   No scintigraphic evidence of iodine avid metastatic thyroid cancer.   ASSESSMENT / PLAN / RECOMMENDATIONS:   Hx of papillary Thyroid carcinoma (pT1b, pNX) Stage I:     - S/P total thyroidectomy 09/20/2019 - TSH goal 0.5-2.0 uIU/mL  -Her previous thyroid bed ultrasound shows a stable subcentimeter right thyroid focus, by March 2023 and new subcentimeter left thyroid bed focus has been noted  - She is S/P remnant ablation 08/27/2021 - Pt with excellent biochemical response  -TG, TG AB pending    2. Post-operative  Hypothyroidism      - She is clinically euthyroid  -TSH is below goal, will reduce levothyroxine dose as below   Medications   -Stop levothyroxine 112 mcg daily -Start levothyroxine 100 mcg daily     F/U in 6 months    Signed electronically by: Mack Guise,  MD  [facility]  [facility] [address] [place], [place] [address] Phone: [phone] FAX: [phone]      CC: [name], [place] [address] Phone: [phone]  Fax: [phone]   Return to Endocrinology clinic as below: Future Appointments  Date Time Provider [place]  05/17/2022 10:30 AM Koree Staheli, [name], MD [facility] None  07/15/2022  1:30 PM [facility] ANNUAL WELLNESS VISIT 1 [facility] PEC  12/22/2022 11:30 AM Benay Pike, MD [facility] None

## 2022-05-18 MED ORDER — LEVOTHYROXINE SODIUM 100 MCG PO TABS: 100.0000 ug | ORAL_TABLET | Freq: Every day | ORAL | 3 refills | Status: DC

## 2022-05-19 LAB — THYROGLOBULIN LEVEL: Thyroglobulin: 0.1 ng/mL — ABNORMAL LOW

## 2022-05-19 LAB — THYROGLOBULIN ANTIBODY: Thyroglobulin Ab: <1 IU/mL (ref <=1)

## 2022-06-02 ENCOUNTER — Ambulatory Visit
Admission: RE | Admit: 2022-06-02 | Discharge: 2022-06-02 | Disposition: A | Discharge status: Home / Self Care | Payer: PPO | Source: Ambulatory Visit | Attending: Internal Medicine | Admitting: Internal Medicine

## 2022-06-02 DIAGNOSIS — E041 Nontoxic single thyroid nodule: Secondary | ICD-10-CM | POA: Diagnosis not present

## 2022-06-02 DIAGNOSIS — E890 Postprocedural hypothyroidism: Secondary | ICD-10-CM | POA: Diagnosis not present

## 2022-06-02 DIAGNOSIS — C73 Malignant neoplasm of thyroid gland: Secondary | ICD-10-CM

## 2022-06-07 ENCOUNTER — Ambulatory Visit (INDEPENDENT_AMBULATORY_CARE_PROVIDER_SITE_OTHER): Payer: PPO | Admitting: Physician Assistant

## 2022-06-07 VITALS — BP 118/80 | HR 60 | Temp 98.2°F | Ht 68.0 in | Wt 196.0 lb

## 2022-06-07 DIAGNOSIS — R0981 Nasal congestion: Secondary | ICD-10-CM

## 2022-06-07 MED ORDER — FLUCONAZOLE 150 MG PO TABS: ORAL_TABLET | ORAL | 0 refills | Status: DC

## 2022-06-07 MED ORDER — DOXYCYCLINE HYCLATE 100 MG PO TABS: 100.0000 mg | ORAL_TABLET | Freq: Two times a day (BID) | ORAL | 0 refills | Status: DC

## 2022-06-07 MED ORDER — FLUTICASONE PROPIONATE 50 MCG/ACT NA SUSP: NASAL | 2 refills | Status: DC

## 2022-06-07 NOTE — Patient Instructions (Signed)
It was great to see you!  Continue zyrtec Add flonase  Push fluids and rest  Start antibiotic if needed  Follow-up as needed  Take care,  Jarold Motto PA-C

## 2022-06-07 NOTE — Progress Notes (Signed)
Madison Hopkins is a 67 y.o. female here for a new problem.  History of Present Illness:   Chief Complaint  Patient presents with   Allergies   Sinus Problem    Pt c/o sinus pressure, headaches, nasal congestion. Has been using Mucinex. Covid test Negative.    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 -- starting this next week and wants to make sure she is well by then COVID test negative Having sinus pressure, HA, nasal congestion  Taking mucinex and zyrtec at this time Usually takes Flonase but is currently out  Denies: fevers, chills, poor appetite, lethargy, neck stiffness  Past Medical History:  Diagnosis Date   Abnormal Pap smear of vagina 07/28/2016   LGSIL; colpo 07/2016 atrophic squamous cells; colpo 07/2017 - atypia   Allergy    Arthritis    Breast cancer 2018   Metastatic Left breast   Elevated hemoglobin A1c 07/28/2016   level - 6.1   Endometriosis    GERD (gastroesophageal reflux disease)    HSV-2 infection    Iron deficiency anemia    Low vitamin D level 07/28/2016   level 24.7   Personal history of chemotherapy    finished 10/19   Personal history of radiation therapy    finished 03/2018   Thyroid cancer    Thyroid neoplasm      Social History   Tobacco Use   Smoking status: Never   Smokeless tobacco: Never  Vaping Use   Vaping Use: Never used  Substance Use Topics   Alcohol use: Yes    Alcohol/week: 1.0 standard drink of alcohol    Types: 1 Glasses of wine per week    Comment: Social   Drug use: Not Currently    Types: Marijuana    Comment: everyday    Past Surgical History:  Procedure Laterality Date   ABDOMINAL HYSTERECTOMY     BREAST BIOPSY Left 08/09/2017   BREAST LUMPECTOMY Left 01/22/2018   BREAST LUMPECTOMY WITH RADIOACTIVE SEED AND AXILLARY LYMPH NODE DISSECTION Left 01/22/2018   Procedure: LEFT BREAST LUMPECTOMY WITH RADIOACTIVE SEED AND COMPLETE LEFT AXILLARY LYMPH  NODE DISSECTION;  Surgeon: Claud KelpIngram, Haywood, MD;  Location: [facility];  Service: General;  Laterality: Left;   CESAREAN SECTION     COLON SURGERY     COLOSTOMY     COLOSTOMY REVERSAL     FOOT SURGERY Right    done spur   OOPHORECTOMY     PORT-A-CATH REMOVAL Right 08/28/2018   Procedure: REMOVAL PORT-A-CATH;  Surgeon: Claud KelpIngram, Haywood, MD;  Location: [facility];  Service: General;  Laterality: Right;   PORTACATH PLACEMENT N/A 09/06/2017   Procedure: INSERTION PORT-A-CATH;  Surgeon: Ovidio KinNewman, David, MD;  [facility];  Service: General;  Laterality: N/A;   SMALL INTESTINE SURGERY     SPINE SURGERY     Injections   THYROIDECTOMY N/A 09/20/2019   Procedure: TOTAL THYROIDECTOMY;  Surgeon: Darnell LevelGerkin, Todd, MD;  Location: [facility];  Service: General;  Laterality: N/A;   TUBAL LIGATION  1995    Family History  Problem Relation Age of Onset   Mental illness Mother    Alcoholism Mother    Cirrhosis Mother    Alcohol abuse Mother    Cancer Father    Lung cancer Father     Allergies  Allergen Reactions   Penicillins Nausea Only, Hives and Nausea And Vomiting  Has patient had a PCN reaction causing immediate rash, facial/tongue/throat swelling, SOB or lightheadedness with hypotension: No Has patient had a PCN reaction causing severe rash involving mucus membranes or skin necrosis: No Has patient had a PCN reaction that required hospitalization: No Has patient had a PCN reaction occurring within the last 10 years: Yes--nausea & headache ONLY If all of the above answers are "NO", then may proceed with Cephalosporin use.  Has patient had a PCN reaction causing immediate rash, facial/tongue/throat swelling, SOB or lightheadedness with hypotension: No Has patient had a PCN reaction causing severe rash involving mucus membranes or skin necrosis: No Has patient had a PCN reaction that required hospitalization: No Has patient had a PCN reaction occurring within the  last 10 years: Yes--nausea & headache ONLY If all of the above answers are "NO", then may proceed with Cephalosporin use. Has patient had a PCN reaction causing immediate rash, facial/tongue/throat swelling, SOB or lightheadedness with hypotension: No Has patient had a PCN reaction causing severe rash involving mucus membranes or skin necrosis: No Has patient had a PCN reaction that required hospitalization: No Has patient had a PCN reaction occurring within the last 10 years: Yes--nausea & headache ONLY If all of the above answers are "NO", then may proceed with Cephalosporin use.   Bee Venom     Current Medications:   Current Outpatient Medications:    anastrozole (ARIMIDEX) 1 MG tablet, Take 1 tablet (1 mg total) by mouth daily., Disp: 90 tablet, Rfl: 4   Artificial Tear Solution (OPTI-TEARS OP), Place 1 drop into both eyes 3 (three) times daily as needed (for dry/irritated eyes.)., Disp: , Rfl:    Ascorbic Acid (VITAMIN C) 100 MG CHEW, Chew 100 mg by mouth daily. , Disp: , Rfl:    atorvastatin (LIPITOR) 10 MG tablet, Take 1 tablet (10 mg total) by mouth daily., Disp: 90 tablet, Rfl: 1   BORIC ACID EX, Apply topically. , Disp: , Rfl:    ibuprofen (ADVIL) 800 MG tablet, Take 800 mg by mouth 3 (three) times daily as needed., Disp: , Rfl:    levothyroxine (SYNTHROID) 100 MCG tablet, Take 1 tablet (100 mcg total) by mouth daily., Disp: 90 tablet, Rfl: 3   lidocaine (XYLOCAINE) 5 % ointment, Apply 1 Application topically 3 (three) times daily. Uses as needed., Disp: 1.25 g, Rfl: 1   Multiple Vitamins-Minerals (VITAMIN D3 COMPLETE PO), Take 1 tablet by mouth daily. , Disp: , Rfl:    naproxen sodium (ALEVE) 220 MG tablet, Take 220 mg by mouth 2 (two) times daily as needed (FOR PAIN.)., Disp: , Rfl:    Probiotic Product (ALIGN PO), Take by mouth. , Disp: , Rfl:    valACYclovir (VALTREX) 500 MG tablet, Take 1 tablet (500 mg total) by mouth daily. Take one tablet (500 mg) twice a day for three days  as needed., Disp: 36 tablet, Rfl: 11   fluticasone (FLONASE) 50 MCG/ACT nasal spray, SPRAY 2 SPRAYS INTO EACH NOSTRIL EVERY DAY (Patient not taking: Reported on 06/07/2022), Disp: 16 mL, Rfl: 2   Review of Systems:   ROS Negative unless otherwise specified per HPI.  Vitals:   Vitals:   06/07/22 1517  BP: 118/80  Pulse: 60  Temp: 98.2 F (36.8 C)  TempSrc: Temporal  SpO2: 99%  Weight: 196 lb (88.9 kg)  Height: 5\' 8"  (1.727 m)     Body mass index is 29.8 kg/m.  Physical Exam:   Physical Exam Vitals and nursing note reviewed.  Constitutional:  General: She is not in acute distress.    Appearance: She is well-developed. She is not ill-appearing or toxic-appearing.  HENT:     Head: Normocephalic and atraumatic.     Right Ear: Tympanic membrane, ear canal and external ear normal. Tympanic membrane is not erythematous, retracted or bulging.     Left Ear: Tympanic membrane, ear canal and external ear normal. Tympanic membrane is not erythematous, retracted or bulging.     Nose: Nose normal.     Right Sinus: No maxillary sinus tenderness or frontal sinus tenderness.     Left Sinus: No maxillary sinus tenderness or frontal sinus tenderness.     Mouth/Throat:     Pharynx: Uvula midline. No posterior oropharyngeal erythema.  Eyes:     General: Lids are normal.     Conjunctiva/sclera: Conjunctivae normal.  Neck:     Trachea: Trachea normal.  Cardiovascular:     Rate and Rhythm: Normal rate and regular rhythm.     Heart sounds: Normal heart sounds, S1 normal and S2 normal.  Pulmonary:     Effort: Pulmonary effort is normal.     Breath sounds: Normal breath sounds. No decreased breath sounds, wheezing, rhonchi or rales.  Lymphadenopathy:     Cervical: No cervical adenopathy.  Skin:    General: Skin is warm and dry.  Neurological:     Mental Status: She is alert.  Psychiatric:        Speech: Speech normal.        Behavior: Behavior normal. Behavior is cooperative.      Assessment and Plan:   Sinus congestion No red flags on discussion, patient is not in any obvious distress during our visit. Discussed progression of most viral illness, and recommended supportive care at this point in time. I did however provide pocket rx for oral doxycycline should symptoms not improve as anticipated. Discussed over the counter supportive care options, with recommendations to push fluids and rest. Reviewed return precautions including new/worsening fever, SOB, new/worsening cough or other concerns.  Recommended need to self-quarantine and practice social distancing until symptoms resolve. Discussed current recommendations for COVID testing. I recommend that patient follow-up if symptoms worsen or persist despite treatment x 7-10 days, sooner if needed.     Jarold Motto, PA-C

## 2022-06-09 ENCOUNTER — Encounter (HOSPITAL_COMMUNITY): Payer: Self-pay | Admitting: Emergency Medicine

## 2022-06-09 ENCOUNTER — Emergency Department (HOSPITAL_COMMUNITY): Payer: PPO

## 2022-06-09 ENCOUNTER — Emergency Department (HOSPITAL_COMMUNITY)
Admission: EM | Admit: 2022-06-09 | Discharge: 2022-06-09 | Disposition: A | Discharge status: Home / Self Care | Payer: PPO | Attending: Emergency Medicine | Admitting: Emergency Medicine

## 2022-06-09 DIAGNOSIS — Z85850 Personal history of malignant neoplasm of thyroid: Secondary | ICD-10-CM | POA: Insufficient documentation

## 2022-06-09 DIAGNOSIS — Z853 Personal history of malignant neoplasm of breast: Secondary | ICD-10-CM | POA: Insufficient documentation

## 2022-06-09 DIAGNOSIS — R519 Headache, unspecified: Secondary | ICD-10-CM | POA: Diagnosis not present

## 2022-06-09 NOTE — ED Triage Notes (Signed)
PT had headache in back of head and into neck. She she saw PCP for it. She was prescribed allergy meds. And antibiotics prn. They did not seem to help. Patient is 3 time cancer survivor and wants ct scan of head for reassurance.

## 2022-06-09 NOTE — ED Provider Notes (Signed)
[place] EMERGENCY DEPARTMENT AT [facility] Provider Note   CSN: [id number] Arrival date & time: 06/09/22  4098     History  Chief Complaint  Patient presents with   Headache    Madison Hopkins is a 67 y.o. female.  Patient presents to the emergency department complaining of headache which is mostly on the right side of the head.  She was evaluated by her primary care provider and prescribed with Zyrtec and Flonase.  She was also given a prescription for doxycycline to be used as needed.  She states she has continued to have a mild right-sided headache.  She has a history of breast cancer, thyroid cancer, and is worried about possible aneurysm.  She is here today requesting a CT scan.  Past medical history significant otherwise for seasonal allergies, history of chemotherapy, elevated A1c  HPI     Home Medications Prior to Admission medications   Medication Sig Start Date End Date Taking? Authorizing Provider  anastrozole (ARIMIDEX) 1 MG tablet Take 1 tablet (1 mg total) by mouth daily. 01/26/22   Rachel Moulds, MD  Artificial Tear Solution (OPTI-TEARS OP) Place 1 drop into both eyes 3 (three) times daily as needed (for dry/irritated eyes.).    [provider]  Ascorbic Acid (VITAMIN C) 100 MG CHEW Chew 100 mg by mouth daily.     [provider]  atorvastatin (LIPITOR) 10 MG tablet Take 1 tablet (10 mg total) by mouth daily. 04/13/22   Jarold Motto, PA  BORIC ACID EX Apply topically.     [provider]  doxycycline (VIBRA-TABS) 100 MG tablet Take 1 tablet (100 mg total) by mouth 2 (two) times daily. 06/07/22   Jarold Motto, PA  fluconazole (DIFLUCAN) 150 MG tablet Take 1 tablet as needed and may repeat in 3-5 days if needed 06/07/22   Jarold Motto, PA  fluticasone [facility]) 50 MCG/ACT nasal spray SPRAY 2 SPRAYS INTO EACH NOSTRIL EVERY DAY 06/07/22   Jarold Motto, PA  ibuprofen (ADVIL) 800 MG tablet Take 800 mg by mouth 3 (three)  times daily as needed. 10/02/20   [provider]  levothyroxine (SYNTHROID) 100 MCG tablet Take 1 tablet (100 mcg total) by mouth daily. 05/18/22   Shamleffer, Konrad Dolores, MD  lidocaine (XYLOCAINE) 5 % ointment Apply 1 Application topically 3 (three) times daily. Uses as needed. 11/18/21   Patton Salles, MD  Multiple Vitamins-Minerals (VITAMIN D3 COMPLETE PO) Take 1 tablet by mouth daily.     [provider]  naproxen sodium (ALEVE) 220 MG tablet Take 220 mg by mouth 2 (two) times daily as needed (FOR PAIN.).    [provider]  Probiotic Product (ALIGN PO) Take by mouth.     [provider]  valACYclovir (VALTREX) 500 MG tablet Take 1 tablet (500 mg total) by mouth daily. Take one tablet (500 mg) twice a day for three days as needed. 11/18/21   Patton Salles, MD  prochlorperazine (COMPAZINE) 10 MG tablet Take 1 tablet (10 mg total) by mouth every 6 (six) hours as needed (Nausea or vomiting). 11/09/17 02/01/18  Loa Socks, NP      Allergies    Penicillins and Bee venom    Review of Systems   Review of Systems  Physical Exam Updated Vital Signs BP (!) 164/90   Pulse 61   Temp 97.9 F (36.6 C)   Resp 16   LMP  (LMP Unknown)   SpO2  100%  Physical Exam Vitals and nursing note reviewed.  Constitutional:      General: She is not in acute distress.    Appearance: She is well-developed.  HENT:     Head: Normocephalic and atraumatic.  Eyes:     General: No visual field deficit.    Conjunctiva/sclera: Conjunctivae normal.  Cardiovascular:     Rate and Rhythm: Normal rate and regular rhythm.     Heart sounds: No murmur heard. Pulmonary:     Effort: Pulmonary effort is normal. No respiratory distress.     Breath sounds: Normal breath sounds.  Abdominal:     Palpations: Abdomen is soft.     Tenderness: There is no abdominal tenderness.  Musculoskeletal:        General: No swelling.     Cervical back: Neck  supple.  Skin:    General: Skin is warm and dry.     Capillary Refill: Capillary refill takes less than 2 seconds.  Neurological:     Mental Status: She is alert and oriented to person, place, and time.     GCS: GCS eye subscore is 4. GCS verbal subscore is 5. GCS motor subscore is 6.     Cranial Nerves: No dysarthria or facial asymmetry.     Motor: No weakness.     Coordination: Coordination normal.     Gait: Gait normal.     Comments: Cranial nerves II through VII, XI, XII intact  Psychiatric:        Mood and Affect: Mood normal.     ED Results / Procedures / Treatments   Labs (all labs ordered are listed, but only abnormal results are displayed) Labs Reviewed - No data to display  EKG None  Radiology CT Head Wo Contrast  Result Date: 06/09/2022 CLINICAL DATA:  Headache with increased frequency or severity. EXAM: CT HEAD WITHOUT CONTRAST TECHNIQUE: Contiguous axial images were obtained from the base of the skull through the vertex without intravenous contrast. RADIATION DOSE REDUCTION: This exam was performed according to the departmental dose-optimization program which includes automated exposure control, adjustment of the mA and/or kV according to patient size and/or use of iterative reconstruction technique. COMPARISON:  None Available. FINDINGS: Brain: No evidence of acute infarction, hemorrhage, hydrocephalus, extra-axial collection or mass lesion/mass effect. Vascular: No hyperdense vessel or unexpected calcification. Skull: Normal. Negative for fracture or focal lesion. Sinuses/Orbits: No acute finding. IMPRESSION: No acute finding. Electronically Signed   By: Tiburcio Pea M.D.   On: 06/09/2022 07:32    Procedures Procedures    Medications Ordered in ED Medications - No data to display  ED Course/ Medical Decision Making/ A&P                             Medical Decision Making  Patient presents with chief complaint of headache.  Differential diagnosis includes  but is not limited to tension headache, migraine, other headache disorder, intracranial abnormalities, and others  The patient has comorbidities including personal cancer history, allergies  I reviewed outside medical documentation including office notes from April night where the patient was evaluated by family medicine and diagnosed with sinus congestion.  I see no indication at this time for laboratory workup  I ordered and interpreted imaging including CT head without contrast.  CT scan with no acute abnormality noted.  I agree with the radiologist's findings  I discussed the CT scan findings with the patient.  The patient was  reassured with the CT results.  Her headache is mild in nature.  No meningeal signs at this time.  No nausea, vomiting, or aura to suggest migraine.  I feel this is likely a sinus/tension headache.  Plan to have patient continue with over-the-counter Tylenol and allergy medications.  There is no indication at this time to begin the antibiotics.  She has no sinus drainage at this time.  No sinus tenderness upon my exam.  Discharge home with return precautions.        Final Clinical Impression(s) / ED Diagnoses Final diagnoses:  Sinus headache    Rx / DC Orders ED Discharge Orders     None         Pamala DuffelMcCauley, Orange Hilligoss B, PA-C 06/09/22 1025    Tanda RockersGray, Samuel A, DO 06/16/22 1535

## 2022-06-09 NOTE — Discharge Instructions (Addendum)
You were evaluated today due to a headache.  Your CT scan was reassuring with no signs of acute disease or injury.  Please continue to take your over-the-counter allergy medication.  Continue to take Tylenol as needed for headache.  Follow-up as needed with your primary care provider.  If you develop any life-threatening symptoms please return to the emergency department.

## 2022-06-15 ENCOUNTER — Telehealth: Payer: Self-pay | Admitting: *Deleted

## 2022-06-15 NOTE — Telephone Encounter (Signed)
        Patient  visited [facility] on 06/09/2022  for treatment    Telephone encounter attempt :  1st  A HIPAA compliant voice message was left requesting a return call.  Instructed patient to call back at [phone].  Yehuda Mao Greenauer -[facility] [place], Population Health [phone] [address] [place] , [place] [address] Email : [email or web address]

## 2022-06-16 ENCOUNTER — Telehealth: Payer: Self-pay | Admitting: *Deleted

## 2022-06-16 NOTE — Telephone Encounter (Signed)
     Patient  visit on [facility]  on  06/09/2022 was for treatment   Have you been able to follow up with your primary care physician? Will if she needs to  The patient was able to obtain any needed medicine or equipment.  Are there diet recommendations that you are having difficulty following? NA Patient expresses understanding of discharge instructions and education provided has no other needs at this time.  Yes Alois Cliche -[facility] [place], Population Health [phone] [address] [place] , [place] [address] Email : [email or web address]

## 2022-06-22 ENCOUNTER — Telehealth: Payer: Self-pay | Admitting: Physician Assistant

## 2022-06-22 NOTE — Telephone Encounter (Signed)
Contacted Madison Hopkins to schedule their annual wellness visit. Appointment made for 06/30/2022.  Madison Hopkins [facility] AWV TEAM Direct Dial [phone]

## 2022-06-29 ENCOUNTER — Encounter: Payer: Self-pay | Admitting: Physician Assistant

## 2022-06-29 ENCOUNTER — Ambulatory Visit (INDEPENDENT_AMBULATORY_CARE_PROVIDER_SITE_OTHER): Payer: PPO | Admitting: Physician Assistant

## 2022-06-29 VITALS — BP 138/76 | HR 69 | Temp 97.7°F | Ht 68.0 in | Wt 196.2 lb

## 2022-06-29 DIAGNOSIS — G4452 New daily persistent headache (NDPH): Secondary | ICD-10-CM | POA: Diagnosis not present

## 2022-06-29 DIAGNOSIS — C50912 Malignant neoplasm of unspecified site of left female breast: Secondary | ICD-10-CM | POA: Diagnosis not present

## 2022-06-29 LAB — CBC WITH DIFFERENTIAL/PLATELET
Basophils Absolute: 0 10*3/uL (ref 0.0–0.1)
Basophils Relative: 0.6 % (ref 0.0–3.0)
Eosinophils Absolute: 0.1 10*3/uL (ref 0.0–0.7)
Eosinophils Relative: 1.7 % (ref 0.0–5.0)
HCT: 37.6 % (ref 36.0–46.0)
Hemoglobin: 12.3 g/dL (ref 12.0–15.0)
Lymphocytes Relative: 32.9 % (ref 12.0–46.0)
Lymphs Abs: 2.7 10*3/uL (ref 0.7–4.0)
MCHC: 32.8 g/dL (ref 30.0–36.0)
MCV: 84.1 fl (ref 78.0–100.0)
Monocytes Absolute: 0.4 10*3/uL (ref 0.1–1.0)
Monocytes Relative: 5.5 % (ref 3.0–12.0)
Neutro Abs: 4.8 10*3/uL (ref 1.4–7.7)
Neutrophils Relative %: 59.3 % (ref 43.0–77.0)
Platelets: 281 10*3/uL (ref 150.0–400.0)
RBC: 4.47 Mil/uL (ref 3.87–5.11)
RDW: 13.6 % (ref 11.5–15.5)
WBC: 8.1 10*3/uL (ref 4.0–10.5)

## 2022-06-29 LAB — COMPREHENSIVE METABOLIC PANEL
ALT: 16 U/L (ref 0–35)
AST: 15 U/L (ref 0–37)
Albumin: 4.3 g/dL (ref 3.5–5.2)
Alkaline Phosphatase: 111 U/L (ref 39–117)
BUN: 10 mg/dL (ref 6–23)
CO2: 26 mEq/L (ref 19–32)
Calcium: 10 mg/dL (ref 8.4–10.5)
Chloride: 104 mEq/L (ref 96–112)
Creatinine, Ser: 0.89 mg/dL (ref 0.40–1.20)
GFR: 67.28 mL/min (ref >60.00)
Glucose, Bld: 132 mg/dL — ABNORMAL HIGH (ref 70–99)
Potassium: 3.6 mEq/L (ref 3.5–5.1)
Sodium: 139 mEq/L (ref 135–145)
Total Bilirubin: 0.5 mg/dL (ref 0.2–1.2)
Total Protein: 7.1 g/dL (ref 6.0–8.3)

## 2022-06-29 LAB — TSH: TSH: 0.71 u[IU]/mL (ref 0.35–5.50)

## 2022-06-29 NOTE — Progress Notes (Signed)
Madison Hopkins is a 67 y.o. female here for a new problem.  History of Present Illness:   Chief Complaint  Patient presents with   Headache    Pt was seen in the ED on 4/11. She is having headaches everyday, take Tylenol with relief.    HPI  Persistent Headaches Has been experiencing persistent headaches since 06/03/22. Seen in ED 06/09/22 for sinus headache. CT head showed no abnormality. The headache has lessened significantly. She has been waking up with a mild headache on the top and right side of her head. Delayed starting atorvastatin until 05/30/22. She is unsure if this is related to her headaches. She takes Tylenol at night, nothing in the morning for headache. Denies visual disturbance, numbness/tingling in arms/legs, confusion. Her sleep has been disturbed lately. Denies tingling/burning on scalp. She is experiencing apathy and lack of motivation since the problems with her headaches began.  Past Medical History:  Diagnosis Date   Abnormal Pap smear of vagina 07/28/2016   LGSIL; colpo 07/2016 atrophic squamous cells; colpo 07/2017 - atypia   Allergy    Arthritis    Breast cancer (HCC) 2018   Metastatic Left breast   Elevated hemoglobin A1c 07/28/2016   level - 6.1   Endometriosis    GERD (gastroesophageal reflux disease)    HSV-2 infection    Iron deficiency anemia    Low vitamin D level 07/28/2016   level 24.7   Personal history of chemotherapy    finished 10/19   Personal history of radiation therapy    finished 03/2018   Thyroid cancer (HCC)    Thyroid neoplasm      Social History   Tobacco Use   Smoking status: Never   Smokeless tobacco: Never  Vaping Use   Vaping Use: Never used  Substance Use Topics   Alcohol use: Yes    Alcohol/week: 1.0 standard drink of alcohol    Types: 1 Glasses of wine per week    Comment: Social   Drug use: Not Currently    Types: Marijuana    Comment: everyday    Past Surgical History:  Procedure Laterality Date    ABDOMINAL HYSTERECTOMY     BREAST BIOPSY Left 08/09/2017   BREAST LUMPECTOMY Left 01/22/2018   BREAST LUMPECTOMY WITH RADIOACTIVE SEED AND AXILLARY LYMPH NODE DISSECTION Left 01/22/2018   Procedure: LEFT BREAST LUMPECTOMY WITH RADIOACTIVE SEED AND COMPLETE LEFT AXILLARY LYMPH NODE DISSECTION;  Surgeon: Claud Kelp, MD;  Location: [facility];  Service: General;  Laterality: Left;   CESAREAN SECTION     COLON SURGERY     COLOSTOMY     COLOSTOMY REVERSAL     FOOT SURGERY Right    done spur   OOPHORECTOMY     PORT-A-CATH REMOVAL Right 08/28/2018   Procedure: REMOVAL PORT-A-CATH;  Surgeon: Claud Kelp, MD;  Location: [facility];  Service: General;  Laterality: Right;   PORTACATH PLACEMENT N/A 09/06/2017   Procedure: INSERTION PORT-A-CATH;  Surgeon: Ovidio Kin, MD;  [facility];  Service: General;  Laterality: N/A;   SMALL INTESTINE SURGERY     SPINE SURGERY     Injections   THYROIDECTOMY N/A 09/20/2019   Procedure: TOTAL THYROIDECTOMY;  Surgeon: Darnell Level, MD;  Location: [facility];  Service: General;  Laterality: N/A;   TUBAL LIGATION  1995    Family History  Problem Relation Age of Onset   Mental illness Mother    Alcoholism Mother    Cirrhosis Mother  Alcohol abuse Mother    Cancer Father    Lung cancer Father     Allergies  Allergen Reactions   Penicillins Nausea Only, Hives and Nausea And Vomiting    Has patient had a PCN reaction causing immediate rash, facial/tongue/throat swelling, SOB or lightheadedness with hypotension: No Has patient had a PCN reaction causing severe rash involving mucus membranes or skin necrosis: No Has patient had a PCN reaction that required hospitalization: No Has patient had a PCN reaction occurring within the last 10 years: Yes--nausea & headache ONLY If all of the above answers are "NO", then may proceed with Cephalosporin use.  Has patient had a PCN reaction causing immediate rash,  facial/tongue/throat swelling, SOB or lightheadedness with hypotension: No Has patient had a PCN reaction causing severe rash involving mucus membranes or skin necrosis: No Has patient had a PCN reaction that required hospitalization: No Has patient had a PCN reaction occurring within the last 10 years: Yes--nausea & headache ONLY If all of the above answers are "NO", then may proceed with Cephalosporin use. Has patient had a PCN reaction causing immediate rash, facial/tongue/throat swelling, SOB or lightheadedness with hypotension: No Has patient had a PCN reaction causing severe rash involving mucus membranes or skin necrosis: No Has patient had a PCN reaction that required hospitalization: No Has patient had a PCN reaction occurring within the last 10 years: Yes--nausea & headache ONLY If all of the above answers are "NO", then may proceed with Cephalosporin use.   Bee Venom     Current Medications:   Current Outpatient Medications:    anastrozole (ARIMIDEX) 1 MG tablet, Take 1 tablet (1 mg total) by mouth daily., Disp: 90 tablet, Rfl: 4   Artificial Tear Solution (OPTI-TEARS OP), Place 1 drop into both eyes 3 (three) times daily as needed (for dry/irritated eyes.)., Disp: , Rfl:    Ascorbic Acid (VITAMIN C) 100 MG CHEW, Chew 100 mg by mouth daily. , Disp: , Rfl:    atorvastatin (LIPITOR) 10 MG tablet, Take 1 tablet (10 mg total) by mouth daily., Disp: 90 tablet, Rfl: 1   BORIC ACID EX, Apply topically. , Disp: , Rfl:    fluticasone (FLONASE) 50 MCG/ACT nasal spray, SPRAY 2 SPRAYS INTO EACH NOSTRIL EVERY DAY, Disp: 16 mL, Rfl: 2   levothyroxine (SYNTHROID) 100 MCG tablet, Take 1 tablet (100 mcg total) by mouth daily., Disp: 90 tablet, Rfl: 3   lidocaine (XYLOCAINE) 5 % ointment, Apply 1 Application topically 3 (three) times daily. Uses as needed., Disp: 1.25 g, Rfl: 1   Multiple Vitamins-Minerals (VITAMIN D3 COMPLETE PO), Take 1 tablet by mouth daily. , Disp: , Rfl:    naproxen sodium  (ALEVE) 220 MG tablet, Take 220 mg by mouth 2 (two) times daily as needed (FOR PAIN.)., Disp: , Rfl:    Probiotic Product (ALIGN PO), Take by mouth. , Disp: , Rfl:    valACYclovir (VALTREX) 500 MG tablet, Take 1 tablet (500 mg total) by mouth daily. Take one tablet (500 mg) twice a day for three days as needed., Disp: 36 tablet, Rfl: 11   ibuprofen (ADVIL) 800 MG tablet, Take 800 mg by mouth 3 (three) times daily as needed. (Patient not taking: Reported on 06/29/2022), Disp: , Rfl:    Review of Systems:   Review of Systems  Constitutional:  Negative for fever and malaise/fatigue.  HENT:  Negative for congestion.   Eyes:  Negative for blurred vision.  Respiratory:  Negative for cough and shortness of breath.  Cardiovascular:  Negative for chest pain, palpitations and leg swelling.  Gastrointestinal:  Negative for vomiting.  Musculoskeletal:  Negative for back pain.  Skin:  Negative for rash.       (-) Tingling/burning on scalp  Neurological:  Positive for headaches. Negative for tingling, loss of consciousness and weakness.       (-) Extremity numbness    Vitals:   Vitals:   06/29/22 1315  BP: 138/76  Pulse: 69  Temp: 97.7 F (36.5 C)  TempSrc: Temporal  SpO2: 97%  Weight: 196 lb 4 oz (89 kg)  Height: 5\' 8"  (1.727 m)     Body mass index is 29.84 kg/m.  Physical Exam:   Physical Exam Vitals and nursing note reviewed.  Constitutional:      General: She is not in acute distress.    Appearance: She is well-developed. She is not ill-appearing or toxic-appearing.  HENT:     Head: Normocephalic and atraumatic.     Nose: Nose normal.     Right Sinus: No maxillary sinus tenderness or frontal sinus tenderness.     Left Sinus: No maxillary sinus tenderness or frontal sinus tenderness.     Mouth/Throat:     Pharynx: Uvula midline. No posterior oropharyngeal erythema.  Eyes:     General: Lids are normal.     Conjunctiva/sclera: Conjunctivae normal.  Neck:     Trachea: Trachea  normal.  Cardiovascular:     Rate and Rhythm: Normal rate and regular rhythm.     Heart sounds: Normal heart sounds, S1 normal and S2 normal.  Pulmonary:     Effort: Pulmonary effort is normal.     Breath sounds: Normal breath sounds. No decreased breath sounds, wheezing, rhonchi or rales.  Lymphadenopathy:     Cervical: No cervical adenopathy.  Skin:    General: Skin is warm and dry.  Neurological:     General: No focal deficit present.     Mental Status: She is alert.     Cranial Nerves: Cranial nerves 2-12 are intact.     Sensory: Sensation is intact.     Motor: Motor function is intact.     Coordination: Coordination is intact.     Gait: Gait is intact.  Psychiatric:        Speech: Speech normal.        Behavior: Behavior normal. Behavior is cooperative.     Assessment and Plan:   New daily persistent headache Ongoing Recommend MRI to rule out mass, cerebrovascular accident, etc Will also update blood work Stop Lipitor x 2 weeks to see if this helps ER precautions advised    I,Alexander Ruley,acting as a scribe for [employer], PA.,have documented all relevant documentation on the behalf of Jarold Motto, PA,as directed by  Jarold Motto, PA while in the presence of Jarold Motto, [place].   I, Jarold Motto, [place], have reviewed all documentation for this visit. The documentation on 06/29/22 for the exam, diagnosis, procedures, and orders are all accurate and complete.  I spent a total of 31 minutes on this visit, today 06/29/22, which included reviewing previous notes from ER on 06/09/22, ordering tests, discussing plan of care with patient and using shared-decision making on next steps, discussing medications, and documenting the findings in the note.    Jarold Motto, PA-C

## 2022-06-29 NOTE — Patient Instructions (Addendum)
It was great to see you!  We are getting blood work today I'm scheduling an open MRI to be done ASAP  Please stay by your phone so we can call to schedule this!  Take care,  Jarold Motto PA-C

## 2022-06-30 ENCOUNTER — Other Ambulatory Visit (INDEPENDENT_AMBULATORY_CARE_PROVIDER_SITE_OTHER): Payer: PPO

## 2022-06-30 ENCOUNTER — Other Ambulatory Visit: Payer: Self-pay | Admitting: Physician Assistant

## 2022-06-30 ENCOUNTER — Ambulatory Visit (INDEPENDENT_AMBULATORY_CARE_PROVIDER_SITE_OTHER): Payer: PPO

## 2022-06-30 VITALS — Wt 196.0 lb

## 2022-06-30 DIAGNOSIS — Z0000 Encounter for general adult medical examination without abnormal findings: Secondary | ICD-10-CM | POA: Diagnosis not present

## 2022-06-30 DIAGNOSIS — R739 Hyperglycemia, unspecified: Secondary | ICD-10-CM

## 2022-06-30 LAB — HEMOGLOBIN A1C: Hgb A1c MFr Bld: 6.1 % (ref 4.6–6.5)

## 2022-06-30 NOTE — Progress Notes (Signed)
I connected with  Madison Hopkins on 06/30/22 by a audio enabled telemedicine application and verified that I am speaking with the correct person using two identifiers.  Patient Location: Home  Provider Location: Office/Clinic  I discussed the limitations of evaluation and management by telemedicine. The patient expressed understanding and agreed to proceed.   Subjective:   Madison Hopkins is a 67 y.o. female who presents for Medicare Annual (Subsequent) preventive examination.  Review of Systems           Objective:    Today's Vitals   06/30/22 1354  Weight: 196 lb (88.9 kg)   Body mass index is 29.8 kg/m.     07/02/2021    1:30 PM 04/13/2020    3:21 PM 09/20/2019   12:00 PM 09/10/2019    1:04 PM 08/28/2018    7:00 AM 02/13/2018    9:22 AM 02/08/2018    1:08 PM  Advanced Directives  Does Patient Have a Medical Advance Directive? No No Yes Yes Yes No No  Type of Best boy of [place];Living will Healthcare Power of [place];Living will Living will    Does patient want to make changes to medical advance directive?   No - Patient declined No - Patient declined No - Patient declined    Copy of Healthcare Power of Attorney in Chart?    Yes - validated most recent copy scanned in chart (See row information)     Would patient like information on creating a medical advance directive? Yes (MAU/Ambulatory/Procedural Areas - Information given) Yes (MAU/Ambulatory/Procedural Areas - Information given)    No - Patient declined No - Patient declined    Current Medications (verified) Outpatient Encounter Medications as of 06/30/2022  Medication Sig   anastrozole (ARIMIDEX) 1 MG tablet Take 1 tablet (1 mg total) by mouth daily.   Artificial Tear Solution (OPTI-TEARS OP) Place 1 drop into both eyes 3 (three) times daily as needed (for dry/irritated eyes.).   Ascorbic Acid (VITAMIN C) 100 MG CHEW Chew 100 mg by mouth daily.    atorvastatin (LIPITOR) 10 MG tablet  Take 1 tablet (10 mg total) by mouth daily.   BORIC ACID EX Apply topically.    fluticasone (FLONASE) 50 MCG/ACT nasal spray SPRAY 2 SPRAYS INTO EACH NOSTRIL EVERY DAY   ibuprofen (ADVIL) 800 MG tablet Take 800 mg by mouth 3 (three) times daily as needed. (Patient not taking: Reported on 06/29/2022)   levothyroxine (SYNTHROID) 100 MCG tablet Take 1 tablet (100 mcg total) by mouth daily.   lidocaine (XYLOCAINE) 5 % ointment Apply 1 Application topically 3 (three) times daily. Uses as needed.   Multiple Vitamins-Minerals (VITAMIN D3 COMPLETE PO) Take 1 tablet by mouth daily.    naproxen sodium (ALEVE) 220 MG tablet Take 220 mg by mouth 2 (two) times daily as needed (FOR PAIN.).   Probiotic Product (ALIGN PO) Take by mouth.    valACYclovir (VALTREX) 500 MG tablet Take 1 tablet (500 mg total) by mouth daily. Take one tablet (500 mg) twice a day for three days as needed.   [DISCONTINUED] prochlorperazine (COMPAZINE) 10 MG tablet Take 1 tablet (10 mg total) by mouth every 6 (six) hours as needed (Nausea or vomiting).   No facility-administered encounter medications on file as of 06/30/2022.    Allergies (verified) Penicillins and Bee venom   History: Past Medical History:  Diagnosis Date   Abnormal Pap smear of vagina 07/28/2016   LGSIL; colpo 07/2016 atrophic squamous cells; colpo  07/2017 - atypia   Allergy    Arthritis    Breast cancer (HCC) 2018   Metastatic Left breast   Elevated hemoglobin A1c 07/28/2016   level - 6.1   Endometriosis    GERD (gastroesophageal reflux disease)    HSV-2 infection    Iron deficiency anemia    Low vitamin D level 07/28/2016   level 24.7   Personal history of chemotherapy    finished 10/19   Personal history of radiation therapy    finished 03/2018   Thyroid cancer [facility])    Thyroid neoplasm    Past Surgical History:  Procedure Laterality Date   ABDOMINAL HYSTERECTOMY     BREAST BIOPSY Left 08/09/2017   BREAST LUMPECTOMY Left 01/22/2018   BREAST  LUMPECTOMY WITH RADIOACTIVE SEED AND AXILLARY LYMPH NODE DISSECTION Left 01/22/2018   Procedure: LEFT BREAST LUMPECTOMY WITH RADIOACTIVE SEED AND COMPLETE LEFT AXILLARY LYMPH NODE DISSECTION;  Surgeon: Claud Kelp, MD;  Location: [facility];  Service: General;  Laterality: Left;   CESAREAN SECTION     COLON SURGERY     COLOSTOMY     COLOSTOMY REVERSAL     FOOT SURGERY Right    done spur   OOPHORECTOMY     PORT-A-CATH REMOVAL Right 08/28/2018   Procedure: REMOVAL PORT-A-CATH;  Surgeon: Claud Kelp, MD;  Location: [facility];  Service: General;  Laterality: Right;   PORTACATH PLACEMENT N/A 09/06/2017   Procedure: INSERTION PORT-A-CATH;  Surgeon: Ovidio Kin, MD;  Location: [facility];  Service: General;  Laterality: N/A;   SMALL INTESTINE SURGERY     SPINE SURGERY     Injections   THYROIDECTOMY N/A 09/20/2019   Procedure: TOTAL THYROIDECTOMY;  Surgeon: Darnell Level, MD;  Location: [facility];  Service: General;  Laterality: N/A;   TUBAL LIGATION  1995   Family History  Problem Relation Age of Onset   Mental illness Mother    Alcoholism Mother    Cirrhosis Mother    Alcohol abuse Mother    Cancer Father    Lung cancer Father    Social History   Socioeconomic History   Marital status: Single    Spouse name: Not on file   Number of children: 2   Years of education: Not on file   Highest education level: Some college, no degree  Occupational History   Not on file  Tobacco Use   Smoking status: Never   Smokeless tobacco: Never  Vaping Use   Vaping Use: Never used  Substance and Sexual Activity   Alcohol use: Yes    Alcohol/week: 1.0 standard drink of alcohol    Types: 1 Glasses of wine per week    Comment: Social   Drug use: Not Currently    Types: Marijuana    Comment: everyday   Sexual activity: Yes    Birth control/protection: Post-menopausal, Surgical    Comment: Hysterectomy  Other Topics Concern   Not on file  Social History  Narrative   Retired from [employer] -- retiring next April   Has children    Social Determinants of Health   Financial Resource Strain: Low Risk  (06/07/2022)   Overall Financial Resource Strain (CARDIA)    Difficulty of Paying Living Expenses: Not hard at all  Food Insecurity: No Food Insecurity (06/07/2022)   Hunger Vital Sign    Worried About Running Out of Food in the Last Year: Never true    Ran Out of Food in the  Last Year: Never true  Transportation Needs: No Transportation Needs (06/07/2022)   PRAPARE - Administrator, Civil Service (Medical): No    Lack of Transportation (Non-Medical): No  Physical Activity: Insufficiently Active (06/07/2022)   Exercise Vital Sign    Days of Exercise per Week: 3 days    Minutes of Exercise per Session: 40 min  Stress: No Stress Concern Present (06/07/2022)   [employer] of Occupational Health - Occupational Stress Questionnaire    Feeling of Stress : Not at all  Social Connections: Moderately Integrated (06/07/2022)   Social Connection and Isolation Panel [NHANES]    Frequency of Communication with Friends and Family: Three times a week    Frequency of Social Gatherings with Friends and Family: Twice a week    Attends Religious Services: More than 4 times per year    Active Member of [employer] or Organizations: Yes    Attends Engineer, structural: More than 4 times per year    Marital Status: Never married    Tobacco Counseling Counseling given: Not Answered   Clinical Intake:  Pre-visit preparation completed: Yes  Pain : No/denies pain     BMI - recorded: 29.8 Nutritional Status: BMI 25 -29 Overweight Nutritional Risks: None Diabetes: No  How often do you need to have someone help you when you read instructions, pamphlets, or other written materials from your doctor or pharmacy?: 1 - Never  Diabetic?no  Interpreter Needed?: No  Information entered by :: Lanier Ensign, LPN   Activities of  Daily Living    07/02/2021    1:37 PM  In your present state of health, do you have any difficulty performing the following activities:  Hearing? 0  Vision? 0  Difficulty concentrating or making decisions? 0  Walking or climbing stairs? 0  Dressing or bathing? 0  Doing errands, shopping? 0  Preparing Food and eating ? N  Using the Toilet? N  In the past six months, have you accidently leaked urine? N  Do you have problems with loss of bowel control? N  Managing your Medications? N  Managing your Finances? N  Housekeeping or managing your Housekeeping? N    Patient Care Team: Jarold Motto, [place] as PCP - General (Physician Assistant) [name], Valentino Hue, MD (Inactive) as Consulting Physician (Oncology) Patton Salles, MD as Consulting Physician (Obstetrics and Gynecology) Gala Romney, Bevelyn Buckles, MD as Consulting Physician (Cardiology) Claria Dice, MD as Attending Physician (Physical Medicine and Rehabilitation) [employer] Konrad Dolores, MD as Consulting Physician (Endocrinology) Almond Lint, MD as Consulting Physician (General Surgery)  Indicate any recent Medical Services you may have received from other than [facility] providers in the past year (date may be approximate).     Assessment:   This is a routine wellness examination for Presley.  Hearing/Vision screen No results found.  Dietary issues and exercise activities discussed:     Goals Addressed   None    Depression Screen    06/29/2022    1:18 PM 06/07/2022    3:19 PM 01/31/2022   10:44 AM 07/02/2021    1:25 PM 02/01/2021    1:16 PM 01/14/2020    7:52 AM 02/20/2019   11:36 AM  PHQ 2/9 Scores  PHQ - 2 Score 0 0 1 0 0 0 0  PHQ- 9 Score 6 2 1         Fall Risk    07/02/2021    1:37 PM 02/01/2021    1:16 PM 02/20/2019  11:36 AM 09/05/2018   11:58 AM 07/06/2018    8:16 AM  Fall Risk   Falls in the past year? 1 1 1  0 0  Number falls in past yr: 1 0 0 0 0  Injury with Fall? 1 1 1  0 0  Comment   eye injury     Risk for fall due to : Impaired vision      Follow up Falls prevention discussed  Falls evaluation completed Falls evaluation completed Falls evaluation completed    FALL RISK PREVENTION PERTAINING TO THE HOME:  Any stairs in or around the home? Yes  If so, are there any without handrails? No  Home free of loose throw rugs in walkways, pet beds, electrical cords, etc? Yes  Adequate lighting in your home to reduce risk of falls? Yes   ASSISTIVE DEVICES UTILIZED TO PREVENT FALLS:  Life alert? No  Use of a cane, walker or w/c? No  Grab bars in the bathroom? No  Shower chair or bench in shower? Yes  Elevated toilet seat or a handicapped toilet? No   TIMED UP AND GO:  Was the test performed? No .   Cognitive Function:        07/02/2021    1:38 PM  6CIT Screen  What Year? 0 points  What month? 0 points  What time? 0 points  Count back from 20 0 points  Months in reverse 0 points  Repeat phrase 0 points  Total Score 0 points    Immunizations Immunization History  Administered Date(s) Administered   Fluad Quad(high Dose 65+) 01/31/2022   Influenza,inj,Quad PF,6+ Mos 01/14/2020   PFIZER Comirnaty(Gray Top)Covid-19 Tri-Sucrose Vaccine 10/22/2020   PFIZER(Purple Top)SARS-COV-2 Vaccination 07/01/2019, 07/22/2019, 02/16/2020   PNEUMOCOCCAL CONJUGATE-20 01/31/2022   Tdap 10/23/2018    TDAP status: Up to date  Flu Vaccine status: Up to date  Pneumococcal vaccine status: Up to date  Covid-19 vaccine status: Completed vaccines  Qualifies for Shingles Vaccine? Yes   Zostavax completed No   Shingrix Completed?: No.    Education has been provided regarding the importance of this vaccine. Patient has been advised to call insurance company to determine out of pocket expense if they have not yet received this vaccine. Advised may also receive vaccine at local pharmacy or Health Dept. Verbalized acceptance and understanding.  Screening Tests Health Maintenance  Topic Date Due    COVID-19 Vaccine (5 - 2023-24 season) 02/01/2023 (Originally 10/29/2021)   Zoster Vaccines- Shingrix (1 of 2) 02/01/2023 (Originally 06/28/1974)   INFLUENZA VACCINE  09/29/2022   Fecal DNA (Cologuard)  01/21/2023   MAMMOGRAM  01/29/2023   Medicare Annual Wellness (AWV)  06/30/2023   DEXA SCAN  03/02/2024   DTaP/Tdap/Td (2 - Td or Tdap) 10/22/2028   Pneumonia Vaccine 40+ Years old  Completed   Hepatitis C Screening  Completed   HPV VACCINES  Aged Out    Health Maintenance  There are no preventive care reminders to display for this patient.   Colorectal cancer screening: Type of screening: Colonoscopy. Completed 01/21/20. Repeat every 3 years  Mammogram status: Completed 01/28/22. Repeat every year  Bone Density status: Completed 03/02/22. Results reflect: Bone density results: OSTEOPOROSIS. Repeat every 2 years.  Additional Screening:  Hepatitis C Screening:  Completed 09/30/20  Vision Screening: Recommended annual ophthalmology exams for early detection of glaucoma and other disorders of the eye. Is the patient up to date with their annual eye exam?  Yes  Who is the provider or what is  the name of the office in which the patient attends annual eye exams? [facility]  If pt is not established with a provider, would they like to be referred to a provider to establish care? No .   Dental Screening: Recommended annual dental exams for proper oral hygiene  Community Resource Referral / Chronic Care Management: CRR required this visit?  No   CCM required this visit?  No      Plan:     I have personally reviewed and noted the following in the patient's chart:   Medical and social history Use of alcohol, tobacco or illicit drugs  Current medications and supplements including opioid prescriptions. Patient is not currently taking opioid prescriptions. Functional ability and status Nutritional status Physical activity Advanced directives List of other physicians Hospitalizations,  surgeries, and ER visits in previous 12 months Vitals Screenings to include cognitive, depression, and falls Referrals and appointments  In addition, I have reviewed and discussed with patient certain preventive protocols, quality metrics, and best practice recommendations. A written personalized care plan for preventive services as well as general preventive health recommendations were provided to patient.     Marzella Schlein, LPN   02/28/9145   Nurse Notes: none

## 2022-06-30 NOTE — Patient Instructions (Signed)
Madison Hopkins , Thank you for taking time to come for your Medicare Wellness Visit. I appreciate your ongoing commitment to your health goals. Please review the following plan we discussed and let me know if I can assist you in the future.   These are the goals we discussed:  Goals      Patient Stated     Work on [employer]      Patient Stated     Keep weight down and exercise         This is a list of the screening recommended for you and due dates:  Health Maintenance  Topic Date Due   COVID-19 Vaccine (5 - 2023-24 season) 02/01/2023*   Zoster (Shingles) Vaccine (1 of 2) 02/01/2023*   Flu Shot  09/29/2022   Cologuard (Stool DNA test)  01/21/2023   Mammogram  01/29/2023   Medicare Annual Wellness Visit  06/30/2023   DEXA scan (bone density measurement)  03/02/2024   DTaP/Tdap/Td vaccine (2 - Td or Tdap) 10/22/2028   Pneumonia Vaccine  Completed   Hepatitis C Screening: USPSTF Recommendation to screen - Ages 65-79 yo.  Completed   HPV Vaccine  Aged Out  *Topic was postponed. The date shown is not the original due date.    Advanced directives copies in chart   Conditions/risks identified: keep weight down and exercise   Next appointment: Follow up in one year for your annual wellness visit    Preventive Care 65 Years and Older, Female Preventive care refers to lifestyle choices and visits with your health care provider that can promote health and wellness. What does preventive care include? A yearly physical exam. This is also called an annual well check. Dental exams once or twice a year. Routine eye exams. Ask your health care provider how often you should have your eyes checked. Personal lifestyle choices, including: Daily care of your teeth and gums. Regular physical activity. Eating a healthy diet. Avoiding tobacco and drug use. Limiting alcohol use. Practicing safe sex. Taking low-dose aspirin every day. Taking vitamin and mineral supplements as recommended by your  health care provider. What happens during an annual well check? The services and screenings done by your health care provider during your annual well check will depend on your age, overall health, lifestyle risk factors, and family history of disease. Counseling  Your health care provider may ask you questions about your: Alcohol use. Tobacco use. Drug use. Emotional well-being. Home and relationship well-being. Sexual activity. Eating habits. History of falls. Memory and ability to understand (cognition). Work and work Astronomer. Reproductive health. Screening  You may have the following tests or measurements: Height, weight, and BMI. Blood pressure. Lipid and cholesterol levels. These may be checked every 5 years, or more frequently if you are over 7 years old. Skin check. Lung cancer screening. You may have this screening every year starting at age 49 if you have a 30-pack-year history of smoking and currently smoke or have quit within the past 15 years. Fecal occult blood test (FOBT) of the stool. You may have this test every year starting at age 62. Flexible sigmoidoscopy or colonoscopy. You may have a sigmoidoscopy every 5 years or a colonoscopy every 10 years starting at age 63. Hepatitis C blood test. Hepatitis B blood test. Sexually transmitted disease (STD) testing. Diabetes screening. This is done by checking your blood sugar (glucose) after you have not eaten for a while (fasting). You may have this done every 1-3 years. Bone density scan.  This is done to screen for osteoporosis. You may have this done starting at age 52. Mammogram. This may be done every 1-2 years. Talk to your health care provider about how often you should have regular mammograms. Talk with your health care provider about your test results, treatment options, and if necessary, the need for more tests. Vaccines  Your health care provider may recommend certain vaccines, such as: Influenza vaccine.  This is recommended every year. Tetanus, diphtheria, and acellular pertussis (Tdap, Td) vaccine. You may need a Td booster every 10 years. Zoster vaccine. You may need this after age 33. Pneumococcal 13-valent conjugate (PCV13) vaccine. One dose is recommended after age 12. Pneumococcal polysaccharide (PPSV23) vaccine. One dose is recommended after age 21. Talk to your health care provider about which screenings and vaccines you need and how often you need them. This information is not intended to replace advice given to you by your health care provider. Make sure you discuss any questions you have with your health care provider. Document Released: 03/13/2015 Document Revised: 11/04/2015 Document Reviewed: 12/16/2014 Elsevier Interactive Patient Education  2017 [place] Prevention in the Home Falls can cause injuries. They can happen to people of all ages. There are many things you can do to make your home safe and to help prevent falls. What can I do on the outside of my home? Regularly fix the edges of walkways and driveways and fix any cracks. Remove anything that might make you trip as you walk through a door, such as a raised step or threshold. Trim any bushes or trees on the path to your home. Use bright outdoor lighting. Clear any walking paths of anything that might make someone trip, such as rocks or tools. Regularly check to see if handrails are loose or broken. Make sure that both sides of any steps have handrails. Any raised decks and porches should have guardrails on the edges. Have any leaves, snow, or ice cleared regularly. Use sand or salt on walking paths during winter. Clean up any spills in your garage right away. This includes oil or grease spills. What can I do in the bathroom? Use night lights. Install grab bars by the toilet and in the tub and shower. Do not use towel bars as grab bars. Use non-skid mats or decals in the tub or shower. If you need to sit  down in the shower, use a plastic, non-slip stool. Keep the floor dry. Clean up any water that spills on the floor as soon as it happens. Remove soap buildup in the tub or shower regularly. Attach bath mats securely with double-sided non-slip rug tape. Do not have throw rugs and other things on the floor that can make you trip. What can I do in the bedroom? Use night lights. Make sure that you have a light by your bed that is easy to reach. Do not use any sheets or blankets that are too big for your bed. They should not hang down onto the floor. Have a firm chair that has side arms. You can use this for support while you get dressed. Do not have throw rugs and other things on the floor that can make you trip. What can I do in the kitchen? Clean up any spills right away. Avoid walking on wet floors. Keep items that you use a lot in easy-to-reach places. If you need to reach something above you, use a strong step stool that has a grab bar. Keep electrical cords  out of the way. Do not use floor polish or wax that makes floors slippery. If you must use wax, use non-skid floor wax. Do not have throw rugs and other things on the floor that can make you trip. What can I do with my stairs? Do not leave any items on the stairs. Make sure that there are handrails on both sides of the stairs and use them. Fix handrails that are broken or loose. Make sure that handrails are as long as the stairways. Check any carpeting to make sure that it is firmly attached to the stairs. Fix any carpet that is loose or worn. Avoid having throw rugs at the top or bottom of the stairs. If you do have throw rugs, attach them to the floor with carpet tape. Make sure that you have a light switch at the top of the stairs and the bottom of the stairs. If you do not have them, ask someone to add them for you. What else can I do to help prevent falls? Wear shoes that: Do not have high heels. Have rubber bottoms. Are  comfortable and fit you well. Are closed at the toe. Do not wear sandals. If you use a stepladder: Make sure that it is fully opened. Do not climb a closed stepladder. Make sure that both sides of the stepladder are locked into place. Ask someone to hold it for you, if possible. Clearly mark and make sure that you can see: Any grab bars or handrails. First and last steps. Where the edge of each step is. Use tools that help you move around (mobility aids) if they are needed. These include: Canes. Walkers. Scooters. Crutches. Turn on the lights when you go into a dark area. Replace any light bulbs as soon as they burn out. Set up your furniture so you have a clear path. Avoid moving your furniture around. If any of your floors are uneven, fix them. If there are any pets around you, be aware of where they are. Review your medicines with your doctor. Some medicines can make you feel dizzy. This can increase your chance of falling. Ask your doctor what other things that you can do to help prevent falls. This information is not intended to replace advice given to you by your health care provider. Make sure you discuss any questions you have with your health care provider. Document Released: 12/11/2008 Document Revised: 07/23/2015 Document Reviewed: 03/21/2014 Elsevier Interactive Patient Education  2017 [employer].

## 2022-07-01 DIAGNOSIS — R5383 Other fatigue: Secondary | ICD-10-CM | POA: Diagnosis not present

## 2022-07-01 DIAGNOSIS — G4452 New daily persistent headache (NDPH): Secondary | ICD-10-CM | POA: Diagnosis not present

## 2022-07-01 DIAGNOSIS — R9082 White matter disease, unspecified: Secondary | ICD-10-CM | POA: Diagnosis not present

## 2022-07-01 DIAGNOSIS — H538 Other visual disturbances: Secondary | ICD-10-CM | POA: Diagnosis not present

## 2022-07-11 ENCOUNTER — Encounter: Payer: Self-pay | Admitting: Physician Assistant

## 2022-07-13 ENCOUNTER — Other Ambulatory Visit: Payer: Self-pay | Admitting: Hematology and Oncology

## 2022-07-13 ENCOUNTER — Encounter: Payer: Self-pay | Admitting: Physician Assistant

## 2022-07-13 NOTE — Telephone Encounter (Signed)
Madison Hopkins, Pt had MRI calling for results. The results are under Care Everywhere. Please advise.

## 2022-07-14 ENCOUNTER — Other Ambulatory Visit: Payer: Self-pay | Admitting: Physician Assistant

## 2022-07-14 DIAGNOSIS — G4452 New daily persistent headache (NDPH): Secondary | ICD-10-CM

## 2022-07-18 ENCOUNTER — Encounter: Payer: Self-pay | Admitting: Physician Assistant

## 2022-07-21 ENCOUNTER — Ambulatory Visit (INDEPENDENT_AMBULATORY_CARE_PROVIDER_SITE_OTHER): Payer: PPO | Admitting: Diagnostic Neuroimaging

## 2022-07-21 ENCOUNTER — Encounter: Payer: Self-pay | Admitting: Diagnostic Neuroimaging

## 2022-07-21 VITALS — BP 128/77 | HR 63 | Ht 68.0 in | Wt 193.0 lb

## 2022-07-21 DIAGNOSIS — G44209 Tension-type headache, unspecified, not intractable: Secondary | ICD-10-CM

## 2022-07-21 NOTE — Progress Notes (Signed)
[facility]  PATIENT: Madison Hopkins DOB: [birth year]  REFERRING CLINICIAN: Jarold Motto, PA HISTORY FROM: patient  REASON FOR VISIT: new consult   HISTORICAL  CHIEF COMPLAINT:  Chief Complaint  Patient presents with   Headache    Rm 6 alone Pt is well, reports she is having daily headaches but they are not as sever. Pain has went from a 10 to a 4. Associated blurred vision, sensitivity to light.     HISTORY OF PRESENT ILLNESS:   68 year old female here for evaluation of headaches.    April 2024 patient had onset of sinus pressure in her face and head.  She was treated with antibiotics and Flonase.  Patient was concerned about other causes of symptoms.  Patient ended up going to the emergency room for evaluation and CT of the head was done which was unremarkable.  She went to PCP and also had MRI of the brain done which showed no acute findings.  Patient has had some photosensitivity with headaches but this has been mild.  Overall headaches have significantly improved on their own.  She still has some intermittent dull headaches.  Sometimes she has some mild blurred vision.  Patient is very active and leads a very healthy lifestyle.  She exercises regularly including walking, swimming, weight training and yoga.  She has a nutritious and healthy diet.   REVIEW OF SYSTEMS: Full 14 system review of systems performed and negative with exception of: as per HPI.  ALLERGIES: Allergies  Allergen Reactions   Penicillins Nausea Only, Hives and Nausea And Vomiting    Has patient had a PCN reaction causing immediate rash, facial/tongue/throat swelling, SOB or lightheadedness with hypotension: No Has patient had a PCN reaction causing severe rash involving mucus membranes or skin necrosis: No Has patient had a PCN reaction that required hospitalization: No Has patient had a PCN reaction occurring within the last 10 years: Yes--nausea & headache ONLY If all of the  above answers are "NO", then may proceed with Cephalosporin use.  Has patient had a PCN reaction causing immediate rash, facial/tongue/throat swelling, SOB or lightheadedness with hypotension: No Has patient had a PCN reaction causing severe rash involving mucus membranes or skin necrosis: No Has patient had a PCN reaction that required hospitalization: No Has patient had a PCN reaction occurring within the last 10 years: Yes--nausea & headache ONLY If all of the above answers are "NO", then may proceed with Cephalosporin use. Has patient had a PCN reaction causing immediate rash, facial/tongue/throat swelling, SOB or lightheadedness with hypotension: No Has patient had a PCN reaction causing severe rash involving mucus membranes or skin necrosis: No Has patient had a PCN reaction that required hospitalization: No Has patient had a PCN reaction occurring within the last 10 years: Yes--nausea & headache ONLY If all of the above answers are "NO", then may proceed with Cephalosporin use.   Bee Venom     HOME MEDICATIONS: Outpatient Medications Prior to Visit  Medication Sig Dispense Refill   anastrozole (ARIMIDEX) 1 MG tablet Take 1 tablet (1 mg total) by mouth daily. 90 tablet 4   Artificial Tear Solution (OPTI-TEARS OP) Place 1 drop into both eyes 3 (three) times daily as needed (for dry/irritated eyes.).     Ascorbic Acid (VITAMIN C) 100 MG CHEW Chew 100 mg by mouth daily.      BORIC ACID EX Apply topically.      fluticasone (FLONASE) 50 MCG/ACT nasal spray SPRAY 2 SPRAYS INTO EACH NOSTRIL  EVERY DAY 16 mL 2   ibuprofen (ADVIL) 800 MG tablet Take 800 mg by mouth 3 (three) times daily as needed.     levothyroxine (SYNTHROID) 100 MCG tablet Take 1 tablet (100 mcg total) by mouth daily. 90 tablet 3   lidocaine (XYLOCAINE) 5 % ointment Apply 1 Application topically 3 (three) times daily. Uses as needed. 1.25 g 1   Multiple Vitamins-Minerals (VITAMIN D3 COMPLETE PO) Take 1 tablet by mouth daily.       Probiotic Product (ALIGN PO) Take by mouth.      valACYclovir (VALTREX) 500 MG tablet Take 1 tablet (500 mg total) by mouth daily. Take one tablet (500 mg) twice a day for three days as needed. 36 tablet 11   atorvastatin (LIPITOR) 10 MG tablet Take 1 tablet (10 mg total) by mouth daily. (Patient not taking: Reported on 07/21/2022) 90 tablet 1   No facility-administered medications prior to visit.    PAST MEDICAL HISTORY: Past Medical History:  Diagnosis Date   Abnormal Pap smear of vagina 07/28/2016   LGSIL; colpo 07/2016 atrophic squamous cells; colpo 07/2017 - atypia   Allergy    Arthritis    Breast cancer (HCC) 2018   Metastatic Left breast   Elevated hemoglobin A1c 07/28/2016   level - 6.1   Endometriosis    GERD (gastroesophageal reflux disease)    HSV-2 infection    Iron deficiency anemia    Low vitamin D level 07/28/2016   level 24.7   Personal history of chemotherapy    finished 10/19   Personal history of radiation therapy    finished 03/2018   Thyroid cancer (HCC)    Thyroid neoplasm     PAST SURGICAL HISTORY: Past Surgical History:  Procedure Laterality Date   ABDOMINAL HYSTERECTOMY     BREAST BIOPSY Left 08/09/2017   BREAST LUMPECTOMY Left 01/22/2018   BREAST LUMPECTOMY WITH RADIOACTIVE SEED AND AXILLARY LYMPH NODE DISSECTION Left 01/22/2018   Procedure: LEFT BREAST LUMPECTOMY WITH RADIOACTIVE SEED AND COMPLETE LEFT AXILLARY LYMPH NODE DISSECTION;  Surgeon: Claud Kelp, MD;  Location: [facility];  Service: General;  Laterality: Left;   CESAREAN SECTION     COLON SURGERY     COLOSTOMY     COLOSTOMY REVERSAL     FOOT SURGERY Right    done spur   OOPHORECTOMY     PORT-A-CATH REMOVAL Right 08/28/2018   Procedure: REMOVAL PORT-A-CATH;  Surgeon: Claud Kelp, MD;  Location: [facility];  Service: General;  Laterality: Right;   PORTACATH PLACEMENT N/A 09/06/2017   Procedure: INSERTION PORT-A-CATH;  Surgeon: Ovidio Kin,  MD;  Location: [facility];  Service: General;  Laterality: N/A;   SMALL INTESTINE SURGERY     SPINE SURGERY     Injections   THYROIDECTOMY N/A 09/20/2019   Procedure: TOTAL THYROIDECTOMY;  Surgeon: Darnell Level, MD;  Location: [facility];  Service: General;  Laterality: N/A;   TUBAL LIGATION  1995    FAMILY HISTORY: Family History  Problem Relation Age of Onset   Mental illness Mother    Alcoholism Mother    Cirrhosis Mother    Alcohol abuse Mother    Cancer Father    Lung cancer Father     SOCIAL HISTORY: Social History   Socioeconomic History   Marital status: Single    Spouse name: Not on file   Number of children: 2   Years of education: Not on file   Highest education level: Some college, no degree  Occupational History   Not on file  Tobacco Use   Smoking status: Never   Smokeless tobacco: Never  Vaping Use   Vaping Use: Never used  Substance and Sexual Activity   Alcohol use: Yes    Alcohol/week: 1.0 standard drink of alcohol    Types: 1 Glasses of wine per week    Comment: Social   Drug use: Not Currently    Types: Marijuana    Comment: everyday   Sexual activity: Yes    Birth control/protection: Post-menopausal, Surgical    Comment: Hysterectomy  Other Topics Concern   Not on file  Social History Narrative   Retired from [employer] -- retiring next April   Has children    Social Determinants of Health   Financial Resource Strain: Low Risk  (06/30/2022)   Overall Financial Resource Strain (CARDIA)    Difficulty of Paying Living Expenses: Not hard at all  Food Insecurity: No Food Insecurity (06/30/2022)   Hunger Vital Sign    Worried About Running Out of Food in the Last Year: Never true    Ran Out of Food in the Last Year: Never true  Transportation Needs: No Transportation Needs (06/30/2022)   PRAPARE - Administrator, Civil Service (Medical): No    Lack of Transportation (Non-Medical): No  Physical Activity: Inactive (06/30/2022)    Exercise Vital Sign    Days of Exercise per Week: 0 days    Minutes of Exercise per Session: 0 min  Stress: No Stress Concern Present (06/30/2022)   [employer] of Occupational Health - Occupational Stress Questionnaire    Feeling of Stress : Not at all  Social Connections: Moderately Integrated (06/30/2022)   Social Connection and Isolation Panel [NHANES]    Frequency of Communication with Friends and Family: More than three times a week    Frequency of Social Gatherings with Friends and Family: More than three times a week    Attends Religious Services: More than 4 times per year    Active Member of [employer] or Organizations: Yes    Attends Banker Meetings: More than 4 times per year    Marital Status: Never married  Intimate Partner Violence: Not At Risk (06/30/2022)   Humiliation, Afraid, Rape, and Kick questionnaire    Fear of Current or Ex-Partner: No    Emotionally Abused: No    Physically Abused: No    Sexually Abused: No     PHYSICAL EXAM  GENERAL EXAM/CONSTITUTIONAL: Vitals:  Vitals:   07/21/22 1428  BP: 128/77  Pulse: 63  Weight: 193 lb (87.5 kg)  Height: 5\' 8"  (1.727 m)   Body mass index is 29.35 kg/m. Wt Readings from Last 3 Encounters:  07/21/22 193 lb (87.5 kg)  06/30/22 196 lb (88.9 kg)  06/29/22 196 lb 4 oz (89 kg)   Patient is in no distress; well developed, nourished and groomed; neck is supple  CARDIOVASCULAR: Examination of carotid arteries is normal; no carotid bruits Regular rate and rhythm, no murmurs Examination of peripheral vascular system by observation and palpation is normal  EYES: Ophthalmoscopic exam of optic discs and posterior segments is normal; no papilledema or hemorrhages No results found.  MUSCULOSKELETAL: Gait, strength, tone, movements noted in Neurologic exam below  NEUROLOGIC: MENTAL STATUS:      No data to display         awake, alert, oriented to person, place and time recent and remote memory  intact normal attention and  concentration language fluent, comprehension intact, naming intact fund of knowledge appropriate  CRANIAL NERVE:  2nd - no papilledema on fundoscopic exam 2nd, 3rd, 4th, 6th - pupils equal and reactive to light, visual fields full to confrontation, extraocular muscles intact, no nystagmus 5th - facial sensation symmetric 7th - facial strength symmetric 8th - hearing intact 9th - palate elevates symmetrically, uvula midline 11th - shoulder shrug symmetric 12th - tongue protrusion midline  MOTOR:  normal bulk and tone, full strength in the BUE, BLE  SENSORY:  normal and symmetric to light touch, temperature, vibration  COORDINATION:  finger-nose-finger, fine finger movements normal  REFLEXES:  deep tendon reflexes TRACE and symmetric  GAIT/STATION:  narrow based gait     DIAGNOSTIC DATA (LABS, IMAGING, TESTING) - I reviewed patient records, labs, notes, testing and imaging myself where available.  Lab Results  Component Value Date   WBC 8.1 06/29/2022   HGB 12.3 06/29/2022   HCT 37.6 06/29/2022   MCV 84.1 06/29/2022   PLT 281.0 06/29/2022      Component Value Date/Time   NA 139 06/29/2022 1340   K 3.6 06/29/2022 1340   CL 104 06/29/2022 1340   CO2 26 06/29/2022 1340   GLUCOSE 132 (H) 06/29/2022 1340   BUN 10 06/29/2022 1340   CREATININE 0.89 06/29/2022 1340   CREATININE 0.94 12/22/2021 1133   CREATININE 0.90 01/14/2020 0852   CALCIUM 10.0 06/29/2022 1340   PROT 7.1 06/29/2022 1340   ALBUMIN 4.3 06/29/2022 1340   AST 15 06/29/2022 1340   AST 13 (L) 12/22/2021 1133   ALT 16 06/29/2022 1340   ALT 15 12/22/2021 1133   ALKPHOS 111 06/29/2022 1340   BILITOT 0.5 06/29/2022 1340   BILITOT 0.6 12/22/2021 1133   GFRNONAA >60 12/22/2021 1133   GFRAA >60 09/21/2019 0627   GFRAA >60 10/25/2017 1055   Lab Results  Component Value Date   CHOL 187 01/31/2022   HDL 54.80 01/31/2022   LDLCALC 106 (H) 01/31/2022   LDLDIRECT 107.0  02/01/2021   TRIG 132.0 01/31/2022   CHOLHDL 3 01/31/2022   Lab Results  Component Value Date   HGBA1C 6.1 06/30/2022   No results found for: "VITAMINB12" Lab Results  Component Value Date   TSH 0.71 06/29/2022    07/01/22 MRI brain [I reviewed images myself and agree with interpretation. Normal for age brain. -VRP]  1.  No acute intracranial abnormality. 2.  Nonspecific minimal white matter disease.    ASSESSMENT AND PLAN  67 y.o. year old female here with:   Dx:  1. Tension headache     PLAN:  TENSION HEADACHES (April 2024; with some throbbing and photosensitivity; ? migraine variant)  - continue ibuprofen / tylenol  - consider migraine treatments in future  - continue seasonal allergy treatments  - To prevent or relieve headaches, try the following: Cool Compress. Lie down and place a cool compress on your head.   Avoid headache triggers. If certain foods or odors seem to have triggered your migraines in the past, avoid them. A headache diary might help you identify triggers.   Include physical activity in your daily routine.  Manage stress. Find healthy ways to cope with the stressors, such as delegating tasks on your to-do list.   Practice relaxation techniques. Try deep breathing, yoga, massage and visualization.   Eat regularly. Eating regularly scheduled meals and maintaining a healthy diet might help prevent headaches. Also, drink plenty of fluids.   Follow a regular sleep schedule. Sleep  deprivation might contribute to headaches Consider biofeedback. With this mind-body technique, you learn to control certain bodily functions -- such as muscle tension, heart rate and blood pressure -- to prevent headaches or reduce headache pain.  Return for return to PCP, pending if symptoms worsen or fail to improve.    Suanne Marker, MD 07/21/2022, 3:03 PM Certified in Neurology, Neurophysiology and Neuroimaging  [facility] [address],  [address] [place], [place] [address] [phone]

## 2022-07-21 NOTE — Patient Instructions (Signed)
TENSION HEADACHES (April 2024; with some throbbing and photosensitivity; ? migraine variant)  - continue ibuprofen / tylenol  - continue seasonal allergy treatments  - To prevent or relieve headaches, try the following: Cool Compress. Lie down and place a cool compress on your head.   Avoid headache triggers. If certain foods or odors seem to have triggered your migraines in the past, avoid them. A headache diary might help you identify triggers.   Include physical activity in your daily routine.  Manage stress. Find healthy ways to cope with the stressors, such as delegating tasks on your to-do list.   Practice relaxation techniques. Try deep breathing, yoga, massage and visualization.   Eat regularly. Eating regularly scheduled meals and maintaining a healthy diet might help prevent headaches. Also, drink plenty of fluids.   Follow a regular sleep schedule. Sleep deprivation might contribute to headaches Consider biofeedback. With this mind-body technique, you learn to control certain bodily functions -- such as muscle tension, heart rate and blood pressure -- to prevent headaches or reduce headache pain.

## 2022-09-30 DIAGNOSIS — M791 Myalgia, unspecified site: Secondary | ICD-10-CM | POA: Diagnosis not present

## 2022-09-30 DIAGNOSIS — M5412 Radiculopathy, cervical region: Secondary | ICD-10-CM | POA: Diagnosis not present

## 2022-10-06 ENCOUNTER — Emergency Department (HOSPITAL_BASED_OUTPATIENT_CLINIC_OR_DEPARTMENT_OTHER)
Admission: EM | Admit: 2022-10-06 | Discharge: 2022-10-06 | Disposition: A | Discharge status: Home / Self Care | Payer: PPO | Attending: Emergency Medicine | Admitting: Emergency Medicine

## 2022-10-06 ENCOUNTER — Emergency Department (HOSPITAL_BASED_OUTPATIENT_CLINIC_OR_DEPARTMENT_OTHER): Payer: PPO | Admitting: Radiology

## 2022-10-06 ENCOUNTER — Ambulatory Visit
Admission: EM | Admit: 2022-10-06 | Discharge: 2022-10-06 | Disposition: A | Discharge status: Home / Self Care | Payer: PPO

## 2022-10-06 ENCOUNTER — Encounter (HOSPITAL_BASED_OUTPATIENT_CLINIC_OR_DEPARTMENT_OTHER): Payer: Self-pay | Admitting: Emergency Medicine

## 2022-10-06 ENCOUNTER — Other Ambulatory Visit: Payer: Self-pay

## 2022-10-06 ENCOUNTER — Other Ambulatory Visit (HOSPITAL_BASED_OUTPATIENT_CLINIC_OR_DEPARTMENT_OTHER): Payer: Self-pay

## 2022-10-06 ENCOUNTER — Encounter: Payer: Self-pay | Admitting: Emergency Medicine

## 2022-10-06 DIAGNOSIS — Z85850 Personal history of malignant neoplasm of thyroid: Secondary | ICD-10-CM | POA: Insufficient documentation

## 2022-10-06 DIAGNOSIS — R079 Chest pain, unspecified: Secondary | ICD-10-CM

## 2022-10-06 DIAGNOSIS — E039 Hypothyroidism, unspecified: Secondary | ICD-10-CM | POA: Diagnosis not present

## 2022-10-06 DIAGNOSIS — R0789 Other chest pain: Secondary | ICD-10-CM | POA: Insufficient documentation

## 2022-10-06 DIAGNOSIS — Z853 Personal history of malignant neoplasm of breast: Secondary | ICD-10-CM | POA: Insufficient documentation

## 2022-10-06 DIAGNOSIS — Z79899 Other long term (current) drug therapy: Secondary | ICD-10-CM | POA: Insufficient documentation

## 2022-10-06 LAB — CBC
HCT: 43.4 % (ref 36.0–46.0)
Hemoglobin: 13.9 g/dL (ref 12.0–15.0)
MCH: 26.9 pg (ref 26.0–34.0)
MCHC: 32 g/dL (ref 30.0–36.0)
MCV: 83.9 fL (ref 80.0–100.0)
Platelets: 382 10*3/uL (ref 150–400)
RBC: 5.17 MIL/uL — ABNORMAL HIGH (ref 3.87–5.11)
RDW: 13.6 % (ref 11.5–15.5)
WBC: 10.9 10*3/uL — ABNORMAL HIGH (ref 4.0–10.5)
nRBC: 0 % (ref 0.0–0.2)

## 2022-10-06 LAB — BASIC METABOLIC PANEL
Anion gap: 10 (ref 5–15)
BUN: 12 mg/dL (ref 8–23)
CO2: 29 mmol/L (ref 22–32)
Calcium: 10.6 mg/dL — ABNORMAL HIGH (ref 8.9–10.3)
Chloride: 101 mmol/L (ref 98–111)
Creatinine, Ser: 0.96 mg/dL (ref 0.44–1.00)
GFR, Estimated: >60 mL/min (ref >60)
Glucose, Bld: 94 mg/dL (ref 70–99)
Potassium: 3.4 mmol/L — ABNORMAL LOW (ref 3.5–5.1)
Sodium: 140 mmol/L (ref 135–145)

## 2022-10-06 LAB — TROPONIN I (HIGH SENSITIVITY): Troponin I (High Sensitivity): 3 ng/L (ref <18)

## 2022-10-06 MED ORDER — POTASSIUM CHLORIDE CRYS ER 20 MEQ PO TBCR
40.0000 meq | EXTENDED_RELEASE_TABLET | Freq: Once | ORAL | Status: AC
  Administered 2022-10-06: 40 meq via ORAL
  Filled 2022-10-06: qty 2

## 2022-10-06 NOTE — ED Provider Notes (Signed)
Patient here today for eval ration of chest/breast pain after what sounds like a syncopal episode 5 days ago.  She did have vomiting and diarrhea following.  She reports she still does not feel "right".  I recommended further evaluation in the emergency room.  Patient is agreeable and will transport via POV.   Tomi Bamberger, PA-C 10/06/22 1227

## 2022-10-06 NOTE — ED Provider Notes (Signed)
  Provider Note MRN:  [id number]  Arrival date & time: 10/06/22    ED Course and Medical Decision Making  Assumed care from Dr Anitra Lauth at shift change.  See note from prior team for complete details, in brief:  67 yo female Hx breast cancer, thyroid carcinoma Here with cc chest pain Began after vomiting/coughing  Left sided chest pain, around 4 days No dyspnea   Plan per prior physician f/u labs, single trop      The patient's chest pain is not suggestive of pulmonary embolus, cardiac ischemia, aortic dissection, pericarditis, myocarditis, pulmonary embolism, pneumothorax, pneumonia, Zoster, or esophageal perforation, or other serious etiology.  Historically not abrupt in onset, tearing or ripping, pulses symmetric. EKG nonspecific for ischemia/infarction. No dysrhythmias, brugada, WPW, prolonged QT noted.   WELL's score low  Troponin negative x1 (onset >24 hours ago). CXR reviewed. Labs without demonstration of acute pathology unless otherwise noted above. Low HEART Score: 0-3 points (0.9-1.7% risk of MACE).]   Given the extremely low risk of these diagnoses further testing and evaluation for these possibilities does not appear to be indicated at this time. Patient in no distress and overall condition improved here in the ED. Detailed discussions were had with the patient regarding current findings, and need for close f/u with PCP or on call doctor. The patient has been instructed to return immediately if the symptoms worsen in any way for re-evaluation. Patient verbalized understanding and is in agreement with current care plan. All questions answered prior to discharge.   Procedures  Final Clinical Impressions(s) / ED Diagnoses     ICD-10-CM   1. Chest wall pain  R07.89     2. Acute nonspecific chest pain with low risk of coronary artery disease  R07.9    Z91.89       ED Discharge Orders     None         Discharge Instructions      It was a pleasure caring for  you today in the emergency department.  Please return to the emergency department for any worsening or worrisome symptoms.          Sloan Leiter, DO 10/06/22 1601

## 2022-10-06 NOTE — ED Triage Notes (Signed)
Pt arrives to ED with c/o chest pain x4 days after a syncopal episode.

## 2022-10-06 NOTE — ED Notes (Signed)
Patient is being discharged from the Urgent Care and sent to the Emergency Department via POV. Per Erma Pinto, PA, patient is in need of higher level of care due to chest/breast pain and syncopal episode. Patient is aware and verbalizes understanding of plan of care.  Vitals:   10/06/22 1209  BP: 125/84  Pulse: 65  Resp: 17  Temp: 98 F (36.7 C)  SpO2: 98%

## 2022-10-06 NOTE — ED Triage Notes (Addendum)
Reports Sunday got choked on crab with chili sauce. Got to where struggling to breathe. Reports woke up on the floor and had urinary incontinence and vomiting. Reports made way off floor, upstairs and in shower and to bed. Reports since chest is sore under left breast. Reports had hx left breast cancer.  Did a "detox to flush all that shit out of me". Reports been doing breathing exercises.  When to pharmacy yesterday and told to take Advil to help with pain from excessive vomiting.   Chest pain is worse with movement and palpation.

## 2022-10-06 NOTE — ED Notes (Signed)
Discharge paperwork given and verbally understood. 

## 2022-10-06 NOTE — ED Provider Notes (Signed)
[place] [facility]ENC[facility] DEPART[facility] AT [facility] Provider Note   CSN: [id number] Arrival date & time: 10/06/22  1257     History  Chief Complaint  Patient presents with   Chest Pain    Madison Hopkins is a 67 y.o. female.  Patient is a 67 year old female with a history of breast cancer who has completed all treatments and is on antiestrogen therapy, hypothyroidism after thyroid cancer and GERD who is presenting today with left-sided chest pain.  Patient reports the pain has been present now since Sunday.  She reports Sunday was a normal day she had gone to church and had some hot dogs at church and then later that evening decided to have some blue crab with a beer and some hot sauce.  She was eating the second blue crab when she took a bite and swallowed and suddenly felt like she could not breathe.  She got up and started moving around went into the kitchen put water on her face but felt like she just could not catch her breath.  She denies having any pain during this time.  She then went back and laid on her floor in the living room and shortly thereafter started having repetitive episodes of emesis that went on for some time.  She reports everything she ate that day had come back up.  But she had no diarrhea.  Shortly after she finished vomiting the shortness of breath completely resolved.  However after that she noticed some discomfort in the left side of her chest and underneath her breast.  That has been present now since Sunday.  She denies any shortness of breath now, nausea or vomiting.  She has been having normal bowel movements.  She has stuck with a pretty liquid diet but has not had any issues swallowing.  She has not had fever or cough.  She has no known heart history.  The history is provided by the patient.  Chest Pain      Home Medications Prior to Admission medications   Medication Sig Start Date End Date Taking? Authorizing Provider  anastrozole (ARIMIDEX) 1 MG  tablet Take 1 tablet (1 mg total) by mouth daily. 01/26/22   Rachel Moulds, MD  Artificial Tear Solution (OPTI-TEARS OP) Place 1 drop into both eyes 3 (three) times daily as needed (for dry/irritated eyes.).    [provider]  Ascorbic Acid (VITAMIN C) 100 MG CHEW Chew 100 mg by mouth daily.     [provider]  BORIC ACID EX Apply topically.     [provider]  fluticasone (FLONASE) 50 MCG/ACT nasal spray SPRAY 2 SPRAYS INTO EACH NOSTRIL EVERY DAY 06/07/22   Jarold Motto, PA  ibuprofen (ADVIL) 800 MG tablet Take 800 mg by mouth 3 (three) times daily as needed. 10/02/20   [provider]  levothyroxine (SYNTHROID) 100 MCG tablet Take 1 tablet (100 mcg total) by mouth daily. 05/18/22   Shamleffer, Konrad Dolores, MD  lidocaine (XYLOCAINE) 5 % ointment Apply 1 Application topically 3 (three) times daily. Uses as needed. 11/18/21   Patton Salles, MD  Multiple Vitamins-Minerals (VITAMIN D3 COMPLETE PO) Take 1 tablet by mouth daily.     [provider]  Probiotic Product (ALIGN PO) Take by mouth.     [provider]  valACYclovir (VALTREX) 500 MG tablet Take 1 tablet (500 mg total) by mouth daily. Take one tablet (500 mg) twice a day for three days as needed. 11/18/21  Patton Salles, MD  prochlorperazine (COMPAZINE) 10 MG tablet Take 1 tablet (10 mg total) by mouth every 6 (six) hours as needed (Nausea or vomiting). 11/09/17 02/01/18  Loa Socks, NP      Allergies    Penicillins, Bee venom, and Tramadol    Review of Systems   Review of Systems  Cardiovascular:  Positive for chest pain.    Physical Exam Updated Vital Signs BP (!) 170/82   Pulse (!) 53   Temp 98 F (36.7 C) (Oral)   Resp 13   Ht 5\' 8"  (1.727 m)   Wt 87.5 kg   LMP  (LMP Unknown)   SpO2 99%   BMI 29.35 kg/m  Physical Exam Vitals and nursing note reviewed.  Constitutional:      General: She is not in acute distress.     Appearance: She is well-developed.  HENT:     Head: Normocephalic and atraumatic.  Eyes:     Pupils: Pupils are equal, round, and reactive to light.  Cardiovascular:     Rate and Rhythm: Normal rate and regular rhythm.     Heart sounds: Normal heart sounds. No murmur heard.    No friction rub.  Pulmonary:     Effort: Pulmonary effort is normal.     Breath sounds: Normal breath sounds. No wheezing or rales.  Chest:     Chest wall: Tenderness present.    Abdominal:     General: Bowel sounds are normal. There is no distension.     Palpations: Abdomen is soft.     Tenderness: There is no abdominal tenderness. There is no guarding or rebound.  Musculoskeletal:        General: No tenderness. Normal range of motion.     Comments: No edema  Skin:    General: Skin is warm and dry.     Findings: No rash.  Neurological:     Mental Status: She is alert and oriented to person, place, and time. Mental status is at baseline.     Cranial Nerves: No cranial nerve deficit.  Psychiatric:        Mood and Affect: Mood normal.        Behavior: Behavior normal.     ED Results / Procedures / Treatments   Labs (all labs ordered are listed, but only abnormal results are displayed) Labs Reviewed  BASIC METABOLIC PANEL  CBC  TROPONIN I (HIGH SENSITIVITY)    EKG EKG Interpretation Date/Time:  Thursday October 06 2022 13:11:25 EDT Ventricular Rate:  56 PR Interval:  158 QRS Duration:  136 QT Interval:  435 QTC Calculation: 420 R Axis:   42  Text Interpretation: Sinus rhythm Probable left atrial enlargement Nonspecific intraventricular conduction delay No previous tracing Confirmed by Gwyneth Sprout ([id number]) on 10/06/2022 1:17:21 PM  Radiology DG Chest 2 View  Result Date: 10/06/2022 CLINICAL DATA:  chest pain EXAM: CHEST - 2 VIEW COMPARISON:  CXR 09/10/20. FINDINGS: No pleural effusion. No pneumothorax. No focal airspace opacity. Normal cardiac and mediastinal contours. No radiographically  apparent displaced rib fractures. Visualized upper abdomen unremarkable. Vertebral body heights are maintained. IMPRESSION: No focal airspace opacity Electronically Signed   By: Lorenza Cambridge M.D.   On: 10/06/2022 13:53    Procedures Procedures    Medications Ordered in ED Medications - No data to display  ED Course/ Medical Decision Making/ A&P  Medical Decision Making Amount and/or Complexity of Data Reviewed Labs: ordered. Radiology: ordered.   Pt with multiple medical problems and comorbidities and presenting today with a complaint that caries a high risk for morbidity and mortality.  Here today complaining of chest pain.  The chest pain seems to be more musculoskeletal in nature as it is worse with certain movements, palpation and taking deep breaths.  It also came to the setting after having repetitive episodes of emesis.  Lower suspicion for ACS today based on patient's story, low suspicion for PE, dissection, pneumonia.  Patient is well-appearing here with sats of 99% on room air.  Pain is reproducible with palpation.  No evidence of zoster at this time.  I independently interpreted patient's labs and EKG.  EKG today is normal. I have independently visualized and interpreted pt's images today.  CXR wnl without evidence of pneumothorax, pneumonia, cardiomegaly or other abnormal findings.          Final Clinical Impression(s) / ED Diagnoses Final diagnoses:  None    Rx / DC Orders ED Discharge Orders     None         Gwyneth Sprout, MD 10/06/22 1516

## 2022-10-06 NOTE — Discharge Instructions (Signed)
It was a pleasure caring for you today in the emergency department. ° °Please return to the emergency department for any worsening or worrisome symptoms. ° ° °

## 2022-10-11 ENCOUNTER — Telehealth: Payer: Self-pay | Admitting: *Deleted

## 2022-10-11 NOTE — Telephone Encounter (Addendum)
This RN spoke with pt per her VM stating onset on what she feels is lymphedema in her breast and could be an infection.  She is wondering about proceeding back to lymphedema clinic.  This RN stated concern due to possible infection need for evaluation prior to seeing the lymphedema clinic.   Appt made with symptom management per above- post visit if appropriate this RN can place referral to the PT team with the lymphedema clinic.

## 2022-10-12 ENCOUNTER — Inpatient Hospital Stay: Payer: PPO

## 2022-10-12 ENCOUNTER — Other Ambulatory Visit: Payer: Self-pay

## 2022-10-12 ENCOUNTER — Inpatient Hospital Stay: Payer: PPO | Attending: Physician Assistant | Admitting: Physician Assistant

## 2022-10-12 VITALS — BP 113/69 | HR 63 | Temp 98.4°F | Resp 16 | Wt 194.7 lb

## 2022-10-12 DIAGNOSIS — Z170 Estrogen receptor positive status [ER+]: Secondary | ICD-10-CM | POA: Diagnosis not present

## 2022-10-12 DIAGNOSIS — C50412 Malignant neoplasm of upper-outer quadrant of left female breast: Secondary | ICD-10-CM

## 2022-10-12 DIAGNOSIS — Z79811 Long term (current) use of aromatase inhibitors: Secondary | ICD-10-CM | POA: Insufficient documentation

## 2022-10-12 DIAGNOSIS — M79602 Pain in left arm: Secondary | ICD-10-CM | POA: Insufficient documentation

## 2022-10-12 LAB — CBC WITH DIFFERENTIAL (CANCER CENTER ONLY)
Abs Immature Granulocytes: 0.02 10*3/uL (ref 0.00–0.07)
Basophils Absolute: 0.1 10*3/uL (ref 0.0–0.1)
Basophils Relative: 1 %
Eosinophils Absolute: 0.2 10*3/uL (ref 0.0–0.5)
Eosinophils Relative: 2 %
HCT: 38.8 % (ref 36.0–46.0)
Hemoglobin: 12.8 g/dL (ref 12.0–15.0)
Immature Granulocytes: 0 %
Lymphocytes Relative: 31 %
Lymphs Abs: 2.6 10*3/uL (ref 0.7–4.0)
MCH: 28.1 pg (ref 26.0–34.0)
MCHC: 33 g/dL (ref 30.0–36.0)
MCV: 85.1 fL (ref 80.0–100.0)
Monocytes Absolute: 0.6 10*3/uL (ref 0.1–1.0)
Monocytes Relative: 7 %
Neutro Abs: 4.8 10*3/uL (ref 1.7–7.7)
Neutrophils Relative %: 59 %
Platelet Count: 308 10*3/uL (ref 150–400)
RBC: 4.56 MIL/uL (ref 3.87–5.11)
RDW: 13.8 % (ref 11.5–15.5)
WBC Count: 8.2 10*3/uL (ref 4.0–10.5)
nRBC: 0 % (ref 0.0–0.2)

## 2022-10-12 NOTE — Progress Notes (Signed)
Symptom Management Consult Note [facility]    Patient Care Team: Jarold Motto, [place] as PCP - General (Physician Assistant) [name], Valentino Hue, MD (Inactive) as Consulting Physician (Oncology) Ardell Isaacs, Forrestine Him, MD as Consulting Physician (Obstetrics and Gynecology) Gala Romney, Bevelyn Buckles, MD as Consulting Physician (Cardiology) Claria Dice, MD as Attending Physician (Physical Medicine and Rehabilitation) [facility], Konrad Dolores, MD as Consulting Physician (Endocrinology) Almond Lint, MD as Consulting Physician (General Surgery)    Name / MRN / DOB: Madison Hopkins  [id number]  10/09/1955   Date of visit: 10/12/2022   Chief Complaint/Reason for visit: left arm pain   Current Therapy: Anastrozole    ASSESSMENT & PLAN: Patient is a 67 y.o. female with oncologic history of HER-2 positive, weakly estrogen receptor positive breast cancer followed by Dr. Al Pimple.  I have viewed most recent oncology note and lab work.    #HER-2 positive, weakly estrogen receptor positive breast cancer  - Next appointment with oncologist is 12/22/22.   #Left arm pain -Will send referral to lymphedema clinic. Symptoms acute on chronic per HPI. -Breast exam without palpable masses. No palpable axillary LAD or signs of cellulitis. CBC unremarkable. -Patient agreeable with plan of care and knows to RTC if symptoms persist.   Strict ED precautions discussed should symptoms worsen.    Heme/Onc History: Oncology History  Malignant neoplasm of upper-outer quadrant of left breast in female, estrogen receptor positive (HCC)  08/10/2017 Initial Diagnosis    67 y.o. [place], [place] woman status post left breast upper outer quadrant and left axillary lymph node biopsy 08/07/2017, both positive for a T2 N1, stage IIB invasive ductal carcinoma, grade 3, estrogen receptor weakly positive, progesterone receptor negative, but HER-2 amplified, with an MIB-1 of 80%             (a) staging  chest CT and bone scan 08/30/2017 showed no evidence of metastatic disease   09/07/2017 - 04/26/2018 Neo-Adjuvant Chemotherapy   neoadjuvant chemotherapy will consist of carboplatin, docetaxel, trastuzumab, and Pertuzumab given every 21 days x 6 starting 09/07/2017, perjeta omitted with cycle 2 and 3 due to diarrhea             (a) Gemcitabine substituted for Docetaxel beginning with cycle 5 and 6 for concerns of neuropathy   trastuzumab continued to complete 6 months, last dose 04/06/2018             (a) echocardiogram 08/28/2017 showed an ejection fraction in the 60-65% range.             (b) echocardiogram on 11/29/2017 showed an EF of 55-60%             (c) echocardiogram 03/07/2018 showed an ejection fraction in the 55-60% range   01/22/2018 Surgery   Left lumpectomy shows a ypT1b pN1a, grade 3 residual invasive ductal carcinoma, agnostic panel HER-2 negative, ER 40% weakly positive, PR negative.              (a) foundation one testing requested on 02/02/2018             (b) total of 11 axillary lymph nodes removed (1+)   03/06/2018 - 04/20/2018 Radiation Therapy   The patient initially received a dose of 50.4 Gy in 28 fractions to the left breast and supraclavicular region using whole-breast tangent fields. This was delivered using a 3-D conformal technique. The patient then received a boost to the surgical scar. This delivered an additional 10 Gy in 5 fractions  using 15X, 10X, 6X photons with a Complex Isodose technique. The total dose was 60.4 Gy.   05/2018 -  Anti-estrogen oral therapy   Anastrozole daily       Interval history-: Madison Hopkins is a 67 y.o. female with oncologic history as above presenting to [facility] today with chief complaint of left arm pain. She presents to clinic unaccompanied.  Patient reports pain in her left arm. It is has been intermittent x at least 1 year. She noticed it has been more bothersome over the last x 1 month. She reports pain aching pain in her left  underarm when laying on her left side in bed. She recently started lifting light weights in an effort to be more active and denies any recent injury or trauma. Patient admits this feels similar to when she was diagnosed with lymphedema in the past. She lost her compression sleeve and has not had the chance to get a new one yet she reports. She denies any left breast pain. She denies any new swelling, numbness, tingling in left arm. She is trying home remedies and exercise techniques for lymphedema that she has seen on [employer]. She thinks they might be helping. No OTC medications taken PTA.     ROS  All other systems are reviewed and are negative for acute change except as noted in the HPI.    Allergies  Allergen Reactions   Penicillins Nausea Only, Hives and Nausea And Vomiting    Has patient had a PCN reaction causing immediate rash, facial/tongue/throat swelling, SOB or lightheadedness with hypotension: No Has patient had a PCN reaction causing severe rash involving mucus membranes or skin necrosis: No Has patient had a PCN reaction that required hospitalization: No Has patient had a PCN reaction occurring within the last 10 years: Yes--nausea & headache ONLY If all of the above answers are "NO", then may proceed with Cephalosporin use.  Has patient had a PCN reaction causing immediate rash, facial/tongue/throat swelling, SOB or lightheadedness with hypotension: No Has patient had a PCN reaction causing severe rash involving mucus membranes or skin necrosis: No Has patient had a PCN reaction that required hospitalization: No Has patient had a PCN reaction occurring within the last 10 years: Yes--nausea & headache ONLY If all of the above answers are "NO", then may proceed with Cephalosporin use. Has patient had a PCN reaction causing immediate rash, facial/tongue/throat swelling, SOB or lightheadedness with hypotension: No Has patient had a PCN reaction causing severe rash involving mucus  membranes or skin necrosis: No Has patient had a PCN reaction that required hospitalization: No Has patient had a PCN reaction occurring within the last 10 years: Yes--nausea & headache ONLY If all of the above answers are "NO", then may proceed with Cephalosporin use.   Bee Venom    Tramadol Nausea Only     Past Medical History:  Diagnosis Date   Abnormal Pap smear of vagina 07/28/2016   LGSIL; colpo 07/2016 atrophic squamous cells; colpo 07/2017 - atypia   Allergy    Arthritis    Breast cancer (HCC) 2018   Metastatic Left breast   Elevated hemoglobin A1c 07/28/2016   level - 6.1   Endometriosis    GERD (gastroesophageal reflux disease)    HSV-2 infection    Iron deficiency anemia    Low vitamin D level 07/28/2016   level 24.7   Personal history of chemotherapy    finished 10/19   Personal history of radiation therapy  finished 03/2018   Thyroid cancer [facility])    Thyroid neoplasm      Past Surgical History:  Procedure Laterality Date   ABDOMINAL HYSTERECTOMY     BREAST BIOPSY Left 08/09/2017   BREAST LUMPECTOMY Left 01/22/2018   BREAST LUMPECTOMY WITH RADIOACTIVE SEED AND AXILLARY LYMPH NODE DISSECTION Left 01/22/2018   Procedure: LEFT BREAST LUMPECTOMY WITH RADIOACTIVE SEED AND COMPLETE LEFT AXILLARY LYMPH NODE DISSECTION;  Surgeon: Claud Kelp, MD;  Location: [facility];  Service: General;  Laterality: Left;   CESAREAN SECTION     COLON SURGERY     COLOSTOMY     COLOSTOMY REVERSAL     FOOT SURGERY Right    done spur   OOPHORECTOMY     PORT-A-CATH REMOVAL Right 08/28/2018   Procedure: REMOVAL PORT-A-CATH;  Surgeon: Claud Kelp, MD;  Location: [facility];  Service: General;  Laterality: Right;   PORTACATH PLACEMENT N/A 09/06/2017   Procedure: INSERTION PORT-A-CATH;  Surgeon: Ovidio Kin, MD;  Location: [facility] OR;  Service: General;  Laterality: N/A;   SMALL INTESTINE SURGERY     SPINE SURGERY     Injections   THYROIDECTOMY N/A  09/20/2019   Procedure: TOTAL THYROIDECTOMY;  Surgeon: Darnell Level, MD;  Location: [facility];  Service: General;  Laterality: N/A;   TUBAL LIGATION  1995    Social History   Socioeconomic History   Marital status: Single    Spouse name: Not on file   Number of children: 2   Years of education: Not on file   Highest education level: Some college, no degree  Occupational History   Not on file  Tobacco Use   Smoking status: Never   Smokeless tobacco: Never  Vaping Use   Vaping status: Never Used  Substance and Sexual Activity   Alcohol use: Yes    Alcohol/week: 1.0 standard drink of alcohol    Types: 1 Glasses of wine per week    Comment: Social   Drug use: Not Currently    Types: Marijuana    Comment: everyday   Sexual activity: Yes    Birth control/protection: Post-menopausal, Surgical    Comment: Hysterectomy  Other Topics Concern   Not on file  Social History Narrative   Retired from [employer] -- retiring next April   Has children    Social Determinants of Health   Financial Resource Strain: Low Risk  (06/30/2022)   Overall Financial Resource Strain (CARDIA)    Difficulty of Paying Living Expenses: Not hard at all  Food Insecurity: No Food Insecurity (06/30/2022)   Hunger Vital Sign    Worried About Running Out of Food in the Last Year: Never true    Ran Out of Food in the Last Year: Never true  Transportation Needs: No Transportation Needs (06/30/2022)   PRAPARE - Administrator, Civil Service (Medical): No    Lack of Transportation (Non-Medical): No  Physical Activity: Inactive (06/30/2022)   Exercise Vital Sign    Days of Exercise per Week: 0 days    Minutes of Exercise per Session: 0 min  Stress: No Stress Concern Present (06/30/2022)   [employer] of Occupational Health - Occupational Stress Questionnaire    Feeling of Stress : Not at all  Social Connections: Moderately Integrated (06/30/2022)   Social Connection and Isolation Panel  [NHANES]    Frequency of Communication with Friends and Family: More than three times a week    Frequency of Social Gatherings  with Friends and Family: More than three times a week    Attends Religious Services: More than 4 times per year    Active Member of Clubs or Organizations: Yes    Attends Banker Meetings: More than 4 times per year    Marital Status: Never married  Intimate Partner Violence: Not At Risk (06/30/2022)   Humiliation, Afraid, Rape, and Kick questionnaire    Fear of Current or Ex-Partner: No    Emotionally Abused: No    Physically Abused: No    Sexually Abused: No    Family History  Problem Relation Age of Onset   Mental illness Mother    Alcoholism Mother    Cirrhosis Mother    Alcohol abuse Mother    Cancer Father    Lung cancer Father      Current Outpatient Medications:    anastrozole (ARIMIDEX) 1 MG tablet, Take 1 tablet (1 mg total) by mouth daily., Disp: 90 tablet, Rfl: 4   Artificial Tear Solution (OPTI-TEARS OP), Place 1 drop into both eyes 3 (three) times daily as needed (for dry/irritated eyes.)., Disp: , Rfl:    Ascorbic Acid (VITAMIN C) 100 MG CHEW, Chew 100 mg by mouth daily. , Disp: , Rfl:    BORIC ACID EX, Apply topically. , Disp: , Rfl:    fluticasone (FLONASE) 50 MCG/ACT nasal spray, SPRAY 2 SPRAYS INTO EACH NOSTRIL EVERY DAY, Disp: 16 mL, Rfl: 2   ibuprofen (ADVIL) 800 MG tablet, Take 800 mg by mouth 3 (three) times daily as needed., Disp: , Rfl:    levothyroxine (SYNTHROID) 100 MCG tablet, Take 1 tablet (100 mcg total) by mouth daily., Disp: 90 tablet, Rfl: 3   lidocaine (XYLOCAINE) 5 % ointment, Apply 1 Application topically 3 (three) times daily. Uses as needed., Disp: 1.25 g, Rfl: 1   Multiple Vitamins-Minerals (VITAMIN D3 COMPLETE PO), Take 1 tablet by mouth daily. , Disp: , Rfl:    Probiotic Product (ALIGN PO), Take by mouth. , Disp: , Rfl:    valACYclovir (VALTREX) 500 MG tablet, Take 1 tablet (500 mg total) by mouth  daily. Take one tablet (500 mg) twice a day for three days as needed., Disp: 36 tablet, Rfl: 11  PHYSICAL EXAM: ECOG FS:1 - Symptomatic but completely ambulatory    Vitals:   10/12/22 1108  BP: 113/69  Pulse: 63  Resp: 16  Temp: 98.4 F (36.9 C)  TempSrc: Oral  SpO2: 99%  Weight: 194 lb 11.2 oz (88.3 kg)   Physical Exam Vitals and nursing note reviewed.  Constitutional:      Appearance: She is not ill-appearing or toxic-appearing.  HENT:     Head: Normocephalic.  Eyes:     Conjunctiva/sclera: Conjunctivae normal.  Cardiovascular:     Rate and Rhythm: Normal rate.     Pulses: Normal pulses.          Radial pulses are 2+ on the left side.  Pulmonary:     Effort: Pulmonary effort is normal.  Chest:  Breasts:    Right: No swelling, mass, skin change or tenderness.     Left: No swelling, mass, skin change or tenderness.  Abdominal:     General: There is no distension.  Musculoskeletal:     Cervical back: Normal range of motion.     Comments: LUE larger compared to RUE. Compartments are soft. Full ROM of LUE. Neurovascularly intact distally.  Lymphadenopathy:     Upper Body:     Right upper body:  No axillary adenopathy.     Left upper body: No axillary adenopathy.  Skin:    General: Skin is warm and dry.  Neurological:     Mental Status: She is alert.        LABORATORY DATA: I have reviewed the data as listed    Latest Ref Rng & Units 10/12/2022   10:41 AM 10/06/2022    1:08 PM 06/29/2022    1:40 PM  CBC  WBC 4.0 - 10.5 K/uL 8.2  10.9  8.1   Hemoglobin 12.0 - 15.0 g/dL 32.4  40.1  02.7   Hematocrit 36.0 - 46.0 % 38.8  43.4  37.6   Platelets 150 - 400 K/uL 308  382  281.0         Latest Ref Rng & Units 10/06/2022    1:08 PM 06/29/2022    1:40 PM 01/31/2022   10:08 AM  CMP  Glucose 70 - 99 mg/dL 94  253  86   BUN 8 - 23 mg/dL 12  10  13    Creatinine 0.44 - 1.00 mg/dL 6.64  4.03  4.74   Sodium 135 - 145 mmol/L 140  139  138   Potassium 3.5 - 5.1 mmol/L 3.4   3.6  4.0   Chloride 98 - 111 mmol/L 101  104  105   CO2 22 - 32 mmol/L 29  26  26    Calcium 8.9 - 10.3 mg/dL 25.9  56.3  87.5   Total Protein 6.0 - 8.3 g/dL  7.1  7.3   Total Bilirubin 0.2 - 1.2 mg/dL  0.5  0.8   Alkaline Phos 39 - 117 U/L  111  134   AST 0 - 37 U/L  15  15   ALT 0 - 35 U/L  16  16        RADIOGRAPHIC STUDIES (from last 24 hours if applicable) I have personally reviewed the radiological images as listed and agreed with the findings in the report. No results found.      Visit Diagnosis: 1. Malignant neoplasm of upper-outer quadrant of left breast in female, estrogen receptor positive (HCC)   2. Left arm pain      No orders of the defined types were placed in this encounter.   All questions were answered. The patient knows to call the clinic with any problems, questions or concerns. No barriers to learning was detected.  A total of more than 20 minutes were spent on this encounter with face-to-face time and non-face-to-face time, including preparing to see the patient, counseling the patient and coordination of care as outlined above.    Thank you for allowing me to participate in the care of this patient.    Shanon Ace, PA-C Department of Hematology/Oncology [facility] at [facility] Phone: [phone]  [phone]    10/12/2022 9:48 PM

## 2022-10-13 ENCOUNTER — Telehealth: Payer: Self-pay

## 2022-10-13 DIAGNOSIS — M5412 Radiculopathy, cervical region: Secondary | ICD-10-CM | POA: Diagnosis not present

## 2022-10-13 NOTE — Telephone Encounter (Signed)
Transition Care Management Follow-up Telephone Call Date of discharge and from where: [facility] 8/8 How have you been since you were released from the hospital? Still having soreness and following up with providers Any questions or concerns? No  Items Reviewed: Did the pt receive and understand the discharge instructions provided? Yes  Medications obtained and verified? No  Other? No  Any new allergies since your discharge? No  Dietary orders reviewed? No Do you have support at home? Yes     Follow up appointments reviewed:  PCP Hospital f/u appt confirmed? No  Scheduled to see  on  @ . Specialist Hospital f/u appt confirmed? Yes  Scheduled to see Cancer Center on 8/14 @ . Are transportation arrangements needed? Yes  If their condition worsens, is the pt aware to call PCP or go to the Emergency Dept.? Yes Was the patient provided with contact information for the PCP's office or ED? Yes Was to pt encouraged to call back with questions or concerns? Yes

## 2022-10-14 ENCOUNTER — Encounter: Payer: Self-pay | Admitting: Family Medicine

## 2022-10-14 ENCOUNTER — Ambulatory Visit: Payer: PPO | Admitting: Family Medicine

## 2022-10-14 VITALS — BP 138/86 | HR 85 | Temp 98.0°F | Wt 195.6 lb

## 2022-10-14 DIAGNOSIS — J0110 Acute frontal sinusitis, unspecified: Secondary | ICD-10-CM

## 2022-10-14 LAB — POCT INFLUENZA A/B
Influenza A, POC: NEGATIVE
Influenza B, POC: NEGATIVE

## 2022-10-14 LAB — POC COVID19 BINAXNOW: SARS Coronavirus 2 Ag: NEGATIVE

## 2022-10-14 MED ORDER — IPRATROPIUM BROMIDE 0.03 % NA SOLN
2.0000 | Freq: Two times a day (BID) | NASAL | 12 refills | Status: AC
Start: 2022-10-14 — End: ?

## 2022-10-14 MED ORDER — FLUCONAZOLE 150 MG PO TABS: 150.0000 mg | ORAL_TABLET | ORAL | 0 refills | Status: DC | PRN

## 2022-10-14 MED ORDER — CEFPROZIL 500 MG PO TABS: 500.0000 mg | ORAL_TABLET | Freq: Two times a day (BID) | ORAL | 0 refills | Status: AC

## 2022-10-14 NOTE — Patient Instructions (Signed)
Continue using Flonase nasal spray daily to help relieve sinus congestion and pressure. Take the prescribed antibiotic (cefprazil) as directed to treat the sinus infection. Complete the full course even if you start to feel better. Use the additional nasal spray (protoprium) as needed in conjunction with Flonase to further alleviate sinus symptoms. Monitor for any symptoms such as chest pain, shortness of breath, or fever, and seek medical attention if they occur. Stay hydrated and rest as much as possible to support your immune system and help recover from the infection.

## 2022-10-14 NOTE — Progress Notes (Signed)
Assessment/Plan:   Problem List Items Addressed This Visit       Respiratory   Acute non-recurrent frontal sinusitis - Primary   Relevant Medications   cefPROZIL (CEFZIL) 500 MG tablet   ipratropium (ATROVENT) 0.03 % nasal spray   fluconazole (DIFLUCAN) 150 MG tablet   Other Relevant Orders   POC COVID-19 BinaxNow (Completed)   POCT Influenza A/B (Completed)   Patient presents with a 2-week history of sinus congestion and head pressure.  Differential diagnosis:  Bacterial sinus infection Non-infectious rhinitis  Plan:  Initiate antibiotic therapy with cefprozil due to patient's intolerance to penicillin. Continue using Flonase. Add ipratropium nasal spray to relieve sinus congestion and pressure. Monitor symptoms and reevaluate if no improvement.  There are no discontinued medications.  Return if symptoms worsen or fail to improve.    Subjective:   Encounter date: 10/14/2022  Madison Hopkins is a 67 y.o. female who has Malignant neoplasm of upper-outer quadrant of left breast in female, estrogen receptor positive (HCC); Port-A-Cath in place; Environmental allergies; Malignant tumor of breast (HCC); COVID-19 virus infection; Neoplasm of uncertain behavior of thyroid gland; Multiple thyroid nodules; Post-operative hypothyroidism; Papillary thyroid carcinoma (HCC); History of thyroid cancer; Malignant neoplastic disease (HCC); S/P total thyroidectomy; and Acute non-recurrent frontal sinusitis on their problem list..   She  has a past medical history of Abnormal Pap smear of vagina (07/28/2016), Allergy, Arthritis, Breast cancer (HCC) (2018), Elevated hemoglobin A1c (07/28/2016), Endometriosis, GERD (gastroesophageal reflux disease), HSV-2 infection, Iron deficiency anemia, Low vitamin D level (07/28/2016), Personal history of chemotherapy, Personal history of radiation therapy, Thyroid cancer (HCC), and Thyroid neoplasm..   Chief Complaint: Patient presents with ongoing  sinus congestion and chest discomfort.  History of Present Illness:  Sinus Congestion: Patient reports experiencing sinus congestion for the past two weeks. Symptoms include pressure behind the eyes, headache, and tender areas around the sinus cavities. The patient has used a natty pot and Mucinex, with minimal relief. Additionally, the patient tested herself at home and was negative for COVID-19 twice within the past week. Flonase has been part of the treatment regimen. The patient also performs lymphatic exercises to alleviate symptoms.The patient reports chest congestion but denies significant trouble breathing, pain or wheezing.  Recent emergency department visit included a thorough workup, which was negative for cardiac issues.   Past Surgical History:  Procedure Laterality Date   ABDOMINAL HYSTERECTOMY     BREAST BIOPSY Left 08/09/2017   BREAST LUMPECTOMY Left 01/22/2018   BREAST LUMPECTOMY WITH RADIOACTIVE SEED AND AXILLARY LYMPH NODE DISSECTION Left 01/22/2018   Procedure: LEFT BREAST LUMPECTOMY WITH RADIOACTIVE SEED AND COMPLETE LEFT AXILLARY LYMPH NODE DISSECTION;  Surgeon: Claud Kelp, MD;  Location: [facility];  Service: General;  Laterality: Left;   CESAREAN SECTION     COLON SURGERY     COLOSTOMY     COLOSTOMY REVERSAL     FOOT SURGERY Right    done spur   OOPHORECTOMY     PORT-A-CATH REMOVAL Right 08/28/2018   Procedure: REMOVAL PORT-A-CATH;  Surgeon: Claud Kelp, MD;  Location:  SURGERY CENTER;  Service: General;  Laterality: Right;   PORTACATH PLACEMENT N/A 09/06/2017   Procedure: INSERTION PORT-A-CATH;  Surgeon: Ovidio Kin, MD;  [facility];  Service: General;  Laterality: N/A;   SMALL INTESTINE SURGERY     SPINE SURGERY     Injections   THYROIDECTOMY N/A 09/20/2019   Procedure: TOTAL THYROIDECTOMY;  Surgeon: Darnell Level, MD;  Location: [facility];  Service: General;  Laterality: N/A;   TUBAL LIGATION  1995    Outpatient  Medications Prior to Visit  Medication Sig Dispense Refill   anastrozole (ARIMIDEX) 1 MG tablet Take 1 tablet (1 mg total) by mouth daily. 90 tablet 4   Artificial Tear Solution (OPTI-TEARS OP) Place 1 drop into both eyes 3 (three) times daily as needed (for dry/irritated eyes.).     Ascorbic Acid (VITAMIN C) 100 MG CHEW Chew 100 mg by mouth daily.      BORIC ACID EX Apply topically.      fluticasone (FLONASE) 50 MCG/ACT nasal spray SPRAY 2 SPRAYS INTO EACH NOSTRIL EVERY DAY 16 mL 2   ibuprofen (ADVIL) 800 MG tablet Take 800 mg by mouth 3 (three) times daily as needed.     levothyroxine (SYNTHROID) 100 MCG tablet Take 1 tablet (100 mcg total) by mouth daily. 90 tablet 3   lidocaine (XYLOCAINE) 5 % ointment Apply 1 Application topically 3 (three) times daily. Uses as needed. 1.25 g 1   Multiple Vitamins-Minerals (VITAMIN D3 COMPLETE PO) Take 1 tablet by mouth daily.      Probiotic Product (ALIGN PO) Take by mouth.      valACYclovir (VALTREX) 500 MG tablet Take 1 tablet (500 mg total) by mouth daily. Take one tablet (500 mg) twice a day for three days as needed. 36 tablet 11   No facility-administered medications prior to visit.    Family History  Problem Relation Age of Onset   Mental illness Mother    Alcoholism Mother    Cirrhosis Mother    Alcohol abuse Mother    Cancer Father    Lung cancer Father     Social History   Socioeconomic History   Marital status: Single    Spouse name: Not on file   Number of children: 2   Years of education: Not on file   Highest education level: Some college, no degree  Occupational History   Not on file  Tobacco Use   Smoking status: Never   Smokeless tobacco: Never  Vaping Use   Vaping status: Never Used  Substance and Sexual Activity   Alcohol use: Yes    Alcohol/week: 1.0 standard drink of alcohol    Types: 1 Glasses of wine per week    Comment: Social   Drug use: Not Currently    Types: Marijuana    Comment: everyday   Sexual  activity: Yes    Birth control/protection: Post-menopausal, Surgical    Comment: Hysterectomy  Other Topics Concern   Not on file  Social History Narrative   Retired from [employer] -- retiring next April   Has children    Social Determinants of Health   Financial Resource Strain: Low Risk  (06/30/2022)   Overall Financial Resource Strain (CARDIA)    Difficulty of Paying Living Expenses: Not hard at all  Food Insecurity: No Food Insecurity (06/30/2022)   Hunger Vital Sign    Worried About Running Out of Food in the Last Year: Never true    Ran Out of Food in the Last Year: Never true  Transportation Needs: No Transportation Needs (06/30/2022)   PRAPARE - Administrator, Civil Service (Medical): No    Lack of Transportation (Non-Medical): No  Physical Activity: Inactive (06/30/2022)   Exercise Vital Sign    Days of Exercise per Week: 0 days    Minutes of Exercise per Session: 0 min  Stress: No Stress Concern Present (  06/30/2022)   [place] [employer] - Occupational Stress Questionnaire    Feeling of Stress : Not at all  Social Connections: Moderately Integrated (06/30/2022)   Social Connection and Isolation Panel [NHANES]    Frequency of Communication with Friends and Family: More than three times a week    Frequency of Social Gatherings with Friends and Family: More than three times a week    Attends Religious Services: More than 4 times per year    Active Member of [employer] or Organizations: Yes    Attends Banker Meetings: More than 4 times per year    Marital Status: Never married  Intimate Partner Violence: Not At Risk (06/30/2022)   Humiliation, Afraid, Rape, and Kick questionnaire    Fear of Current or Ex-Partner: No    Emotionally Abused: No    Physically Abused: No    Sexually Abused: No                                                                                                  Objective:  Physical Exam: BP  138/86 (BP Location: Right Arm, Patient Position: Sitting, Cuff Size: Large)   Pulse 85   Temp 98 F (36.7 C) (Temporal)   Wt 195 lb 9.6 oz (88.7 kg)   LMP  (LMP Unknown)   SpO2 99%   BMI 29.74 kg/m     Physical Exam Constitutional:      General: She is not in acute distress.    Appearance: Normal appearance. She is not ill-appearing or toxic-appearing.  HENT:     Head: Normocephalic and atraumatic.     Nose: Nose normal. No congestion.  Eyes:     General: No scleral icterus.    Extraocular Movements: Extraocular movements intact.  Cardiovascular:     Rate and Rhythm: Normal rate and regular rhythm.     Pulses: Normal pulses.     Heart sounds: Normal heart sounds.  Pulmonary:     Effort: Pulmonary effort is normal. No respiratory distress.     Breath sounds: Normal breath sounds.  Abdominal:     General: Abdomen is flat. Bowel sounds are normal.     Palpations: Abdomen is soft.  Musculoskeletal:        General: Normal range of motion.  Lymphadenopathy:     Cervical: No cervical adenopathy.  Skin:    General: Skin is warm and dry.     Findings: No rash.  Neurological:     General: No focal deficit present.     Mental Status: She is alert and oriented to person, place, and time. Mental status is at baseline.  Psychiatric:        Mood and Affect: Mood is anxious. Affect is tearful.        Behavior: Behavior normal.        Thought Content: Thought content normal.        Judgment: Judgment normal.     DG Chest 2 View  Result Date: 10/06/2022 CLINICAL DATA:  chest pain EXAM: CHEST - 2 VIEW COMPARISON:  CXR 09/10/20. FINDINGS: No  pleural effusion. No pneumothorax. No focal airspace opacity. Normal cardiac and mediastinal contours. No radiographically apparent displaced rib fractures. Visualized upper abdomen unremarkable. Vertebral body heights are maintained. IMPRESSION: No focal airspace opacity Electronically Signed   By: Lorenza Cambridge M.D.   On: 10/06/2022 13:53     Recent Results (from the past 2160 hour(s))  Basic metabolic panel     Status: Abnormal   Collection Time: 10/06/22  1:08 PM  Result Value Ref Range   Sodium 140 135 - 145 mmol/L   Potassium 3.4 (L) 3.5 - 5.1 mmol/L   Chloride 101 98 - 111 mmol/L   CO2 29 22 - 32 mmol/L   Glucose, Bld 94 70 - 99 mg/dL    Comment: Glucose reference range applies only to samples taken after fasting for at least 8 hours.   BUN 12 8 - 23 mg/dL   Creatinine, Ser 2.95 0.44 - 1.00 mg/dL   Calcium 62.1 (H) 8.9 - 10.3 mg/dL   GFR, Estimated >30 >86 mL/min    Comment: (NOTE) Calculated using the CKD-EPI Creatinine Equation (2021)    Anion gap 10 5 - 15    Comment: Performed at [facility], [address] [place], [place] [address]  CBC     Status: Abnormal   Collection Time: 10/06/22  1:08 PM  Result Value Ref Range   WBC 10.9 (H) 4.0 - 10.5 K/uL   RBC 5.17 (H) 3.87 - 5.11 MIL/uL   Hemoglobin 13.9 12.0 - 15.0 g/dL   HCT 96.2 95.2 - 84.1 %   MCV 83.9 80.0 - 100.0 fL   MCH 26.9 26.0 - 34.0 pg   MCHC 32.0 30.0 - 36.0 g/dL   RDW 32.4 40.1 - 02.7 %   Platelets 382 150 - 400 K/uL   nRBC 0.0 0.0 - 0.2 %    Comment: Performed at [facility], [address], [place], [place] [address]  Troponin I (High Sensitivity)     Status: None   Collection Time: 10/06/22  1:08 PM  Result Value Ref Range   Troponin I (High Sensitivity) 3 <18 ng/L    Comment: (NOTE) Elevated high sensitivity troponin I (hsTnI) values and significant  changes across serial measurements may suggest ACS but many other  chronic and acute conditions are known to elevate hsTnI results.  Refer to the "Links" section for chest pain algorithms and additional  guidance. Performed at [facility], [address], [place], [place] [address]   CBC with Differential (Cancer Center Only)     Status: None   Collection Time: 10/12/22 10:41 AM  Result Value Ref Range   WBC Count 8.2  4.0 - 10.5 K/uL   RBC 4.56 3.87 - 5.11 MIL/uL   Hemoglobin 12.8 12.0 - 15.0 g/dL   HCT 74.2 59.5 - 63.8 %   MCV 85.1 80.0 - 100.0 fL   MCH 28.1 26.0 - 34.0 pg   MCHC 33.0 30.0 - 36.0 g/dL   RDW 75.6 43.3 - 29.5 %   Platelet Count 308 150 - 400 K/uL   nRBC 0.0 0.0 - 0.2 %   Neutrophils Relative % 59 %   Neutro Abs 4.8 1.7 - 7.7 K/uL   Lymphocytes Relative 31 %   Lymphs Abs 2.6 0.7 - 4.0 K/uL   Monocytes Relative 7 %   Monocytes Absolute 0.6 0.1 - 1.0 K/uL   Eosinophils Relative 2 %   Eosinophils Absolute 0.2 0.0 - 0.5 K/uL   Basophils Relative  1 %   Basophils Absolute 0.1 0.0 - 0.1 K/uL   Immature Granulocytes 0 %   Abs Immature Granulocytes 0.02 0.00 - 0.07 K/uL    Comment: Performed at [facility], [address] [place], [place] [address]  POC COVID-19 BinaxNow     Status: None   Collection Time: 10/14/22 10:28 AM  Result Value Ref Range   SARS Coronavirus 2 Ag Negative Negative  POCT Influenza A/B     Status: None   Collection Time: 10/14/22 10:28 AM  Result Value Ref Range   Influenza A, POC Negative Negative   Influenza B, POC Negative Negative        Garner Nash, MD, MS

## 2022-10-17 NOTE — Progress Notes (Signed)
Madison Hopkins is a 67 y.o. female here for a new problem.  History of Present Illness:   Chief Complaint  Patient presents with   Follow-up    Pt was seen on 8/16 for sinus problem was put on antibiotic.   Herpes flare    Pt is having an outbreak of herpes in perineal area.    HPI  Rash History of herpes simplex Denies additional recent herpes flare-ups.  She has been taking valacyclovir 500 mg daily and has only missed one dose. She is having some situational stress from health issues. She has been eating ramen noodles with chicken broth.  She believes she may have a UTI.  History of IBS and although she does her best to stay clean she thinks she could have a UTI as a result.  Sinusitis; Fatigue Seen by Dr. Fanny Bien on 10/14/22 for sinusitis and given cefprozil, ipratropium, fluconazole. She is compliant with these and her other regular meds. Stopped vitamins, alcohol intake, herbal substances. Her current symptoms are fatigue, brain fog. Fatigue is the worst symptom. She does not have energy for her normal activities. Denies ear pain. She is hydrating adequately.  Seen in ED on 10/06/22 for chest pain after vomiting and coughing. Had been having left-sided chest pain for 4 days. Chest X-ray showed no focal airspace opacity. EKG was normal. History of breast cancer, hypothyroidism after thyroid cancer, GERD. She is on antiestrogen therapy. Denies chest pain, leg swelling, shortness of breath.  She plans to start therapy for lymphatic system draining.   Past Medical History:  Diagnosis Date   Abnormal Pap smear of vagina 07/28/2016   LGSIL; colpo 07/2016 atrophic squamous cells; colpo 07/2017 - atypia   Allergy    Arthritis    Breast cancer (HCC) 2018   Metastatic Left breast   Elevated hemoglobin A1c 07/28/2016   level - 6.1   Endometriosis    GERD (gastroesophageal reflux disease)    HSV-2 infection    Iron deficiency anemia    Low vitamin D level 07/28/2016   level  24.7   Personal history of chemotherapy    finished 10/19   Personal history of radiation therapy    finished 03/2018   Thyroid cancer (HCC)    Thyroid neoplasm      Social History   Tobacco Use   Smoking status: Never   Smokeless tobacco: Never  Vaping Use   Vaping status: Never Used  Substance Use Topics   Alcohol use: Yes    Alcohol/week: 1.0 standard drink of alcohol    Types: 1 Glasses of wine per week    Comment: Social   Drug use: Not Currently    Types: Marijuana    Comment: everyday    Past Surgical History:  Procedure Laterality Date   ABDOMINAL HYSTERECTOMY     BREAST BIOPSY Left 08/09/2017   BREAST LUMPECTOMY Left 01/22/2018   BREAST LUMPECTOMY WITH RADIOACTIVE SEED AND AXILLARY LYMPH NODE DISSECTION Left 01/22/2018   Procedure: LEFT BREAST LUMPECTOMY WITH RADIOACTIVE SEED AND COMPLETE LEFT AXILLARY LYMPH NODE DISSECTION;  Surgeon: Claud Kelp, MD;  Location: [facility];  Service: General;  Laterality: Left;   CESAREAN SECTION     COLON SURGERY     COLOSTOMY     COLOSTOMY REVERSAL     FOOT SURGERY Right    done spur   OOPHORECTOMY     PORT-A-CATH REMOVAL Right 08/28/2018   Procedure: REMOVAL PORT-A-CATH;  Surgeon: Claud Kelp, MD;  Location:  [facility];  Service: General;  Laterality: Right;   PORTACATH PLACEMENT N/A 09/06/2017   Procedure: INSERTION PORT-A-CATH;  Surgeon: Ovidio Kin, MD;  Location: [facility] OR;  Service: General;  Laterality: N/A;   SMALL INTESTINE SURGERY     SPINE SURGERY     Injections   THYROIDECTOMY N/A 09/20/2019   Procedure: TOTAL THYROIDECTOMY;  Surgeon: Darnell Level, MD;  Location: [facility];  Service: General;  Laterality: N/A;   TUBAL LIGATION  1995    Family History  Problem Relation Age of Onset   Mental illness Mother    Alcoholism Mother    Cirrhosis Mother    Alcohol abuse Mother    Cancer Father    Lung cancer Father     Allergies  Allergen Reactions   Penicillins Nausea Only,  Hives and Nausea And Vomiting    Has patient had a PCN reaction causing immediate rash, facial/tongue/throat swelling, SOB or lightheadedness with hypotension: No Has patient had a PCN reaction causing severe rash involving mucus membranes or skin necrosis: No Has patient had a PCN reaction that required hospitalization: No Has patient had a PCN reaction occurring within the last 10 years: Yes--nausea & headache ONLY If all of the above answers are "NO", then may proceed with Cephalosporin use.  Has patient had a PCN reaction causing immediate rash, facial/tongue/throat swelling, SOB or lightheadedness with hypotension: No Has patient had a PCN reaction causing severe rash involving mucus membranes or skin necrosis: No Has patient had a PCN reaction that required hospitalization: No Has patient had a PCN reaction occurring within the last 10 years: Yes--nausea & headache ONLY If all of the above answers are "NO", then may proceed with Cephalosporin use. Has patient had a PCN reaction causing immediate rash, facial/tongue/throat swelling, SOB or lightheadedness with hypotension: No Has patient had a PCN reaction causing severe rash involving mucus membranes or skin necrosis: No Has patient had a PCN reaction that required hospitalization: No Has patient had a PCN reaction occurring within the last 10 years: Yes--nausea & headache ONLY If all of the above answers are "NO", then may proceed with Cephalosporin use.   Bee Venom    Tramadol Nausea Only    Current Medications:   Current Outpatient Medications:    anastrozole (ARIMIDEX) 1 MG tablet, Take 1 tablet (1 mg total) by mouth daily., Disp: 90 tablet, Rfl: 4   Artificial Tear Solution (OPTI-TEARS OP), Place 1 drop into both eyes 3 (three) times daily as needed (for dry/irritated eyes.)., Disp: , Rfl:    Ascorbic Acid (VITAMIN C) 100 MG CHEW, Chew 100 mg by mouth daily. , Disp: , Rfl:    BORIC ACID EX, Apply topically. , Disp: , Rfl:     cefPROZIL (CEFZIL) 500 MG tablet, Take 1 tablet (500 mg total) by mouth 2 (two) times daily for 7 days., Disp: 14 tablet, Rfl: 0   fluconazole (DIFLUCAN) 150 MG tablet, Take 1 tablet (150 mg total) by mouth as needed for up to 1 dose (vaginal discharge after antibiotics)., Disp: 1 tablet, Rfl: 0   fluticasone (FLONASE) 50 MCG/ACT nasal spray, SPRAY 2 SPRAYS INTO EACH NOSTRIL EVERY DAY, Disp: 16 mL, Rfl: 2   ibuprofen (ADVIL) 800 MG tablet, Take 800 mg by mouth 3 (three) times daily as needed., Disp: , Rfl:    ipratropium (ATROVENT) 0.03 % nasal spray, Place 2 sprays into both nostrils every 12 (twelve) hours., Disp: 30 mL, Rfl: 12   levothyroxine (SYNTHROID) 100 MCG tablet,  Take 1 tablet (100 mcg total) by mouth daily., Disp: 90 tablet, Rfl: 3   lidocaine (XYLOCAINE) 5 % ointment, Apply 1 Application topically 3 (three) times daily. Uses as needed., Disp: 1.25 g, Rfl: 1   Multiple Vitamins-Minerals (VITAMIN D3 COMPLETE PO), Take 1 tablet by mouth daily. , Disp: , Rfl:    Probiotic Product (ALIGN PO), Take by mouth. , Disp: , Rfl:    valACYclovir (VALTREX) 500 MG tablet, Take 1 tablet (500 mg total) by mouth daily. Take one tablet (500 mg) twice a day for three days as needed., Disp: 36 tablet, Rfl: 11   Review of Systems:   Review of Systems  Constitutional:  Positive for malaise/fatigue. Negative for fever.  HENT:  Negative for congestion.   Eyes:  Negative for blurred vision.  Respiratory:  Negative for cough and shortness of breath.   Cardiovascular:  Negative for chest pain, palpitations and leg swelling.  Gastrointestinal:  Negative for vomiting.  Musculoskeletal:  Negative for back pain.  Skin:  Negative for rash.  Neurological:  Positive for headaches. Negative for loss of consciousness.    Vitals:   Vitals:   10/19/22 1304  BP: 120/80  Pulse: 63  Temp: 97.7 F (36.5 C)  TempSrc: Temporal  SpO2: 99%  Weight: 195 lb (88.5 kg)  Height: 5\' 8"  (1.727 m)     Body mass index is  29.65 kg/m.  Physical Exam:   Physical Exam Vitals and nursing note reviewed.  Constitutional:      General: She is not in acute distress.    Appearance: She is well-developed. She is not ill-appearing or toxic-appearing.  Cardiovascular:     Rate and Rhythm: Normal rate and regular rhythm.     Pulses: Normal pulses.     Heart sounds: Normal heart sounds, S1 normal and S2 normal.  Pulmonary:     Effort: Pulmonary effort is normal.     Breath sounds: Normal breath sounds.  Skin:    General: Skin is warm and dry.     Comments: Two large areas of erythematous plaques with vesicles on R medial buttocks  Neurological:     Mental Status: She is alert.     GCS: GCS eye subscore is 4. GCS verbal subscore is 5. GCS motor subscore is 6.  Psychiatric:        Speech: Speech normal.        Behavior: Behavior normal. Behavior is cooperative.     Assessment and Plan:   Fatigue, unspecified type Unclear etiology Suspect multifactorial due to stress, suspected current shingles, recent sinusitis Update blood work and UA to rule out additional cause of symptom(s) Work on mental health efforts Follow-up in 2 weeks Denies SI/HI  Rash and nonspecific skin eruption Possible shingles Start valtrex 1 g three times daily x 7 days Handout provided Follow-up if new/worsening symptom(s)   Acute non-recurrent frontal sinusitis Having slight improvement Recommend stay the course with current regimen Follow-up if new/worsening symptom(s)     I,Alexander Ruley,acting as a scribe for [employer], PA.,have documented all relevant documentation on the behalf of Jarold Motto, PA,as directed by  Jarold Motto, PA while in the presence of Jarold Motto, [place].  I, Jarold Motto, [place], have reviewed all documentation for this visit. The documentation on 10/19/22 for the exam, diagnosis, procedures, and orders are all accurate and complete.   Jarold Motto, PA-C

## 2022-10-19 ENCOUNTER — Ambulatory Visit (INDEPENDENT_AMBULATORY_CARE_PROVIDER_SITE_OTHER): Payer: PPO | Admitting: Physician Assistant

## 2022-10-19 ENCOUNTER — Encounter: Payer: Self-pay | Admitting: Physician Assistant

## 2022-10-19 ENCOUNTER — Other Ambulatory Visit: Payer: Self-pay | Admitting: Internal Medicine

## 2022-10-19 VITALS — BP 120/80 | HR 63 | Temp 97.7°F | Ht 68.0 in | Wt 195.0 lb

## 2022-10-19 DIAGNOSIS — R5383 Other fatigue: Secondary | ICD-10-CM

## 2022-10-19 DIAGNOSIS — R21 Rash and other nonspecific skin eruption: Secondary | ICD-10-CM

## 2022-10-19 DIAGNOSIS — J0110 Acute frontal sinusitis, unspecified: Secondary | ICD-10-CM | POA: Diagnosis not present

## 2022-10-19 LAB — CBC
HCT: 37.8 % (ref 36.0–46.0)
Hemoglobin: 12.2 g/dL (ref 12.0–15.0)
MCHC: 32.1 g/dL (ref 30.0–36.0)
MCV: 84.2 fl (ref 78.0–100.0)
Platelets: 331 10*3/uL (ref 150.0–400.0)
RBC: 4.49 Mil/uL (ref 3.87–5.11)
RDW: 14.8 % (ref 11.5–15.5)
WBC: 9.3 10*3/uL (ref 4.0–10.5)

## 2022-10-19 LAB — COMPREHENSIVE METABOLIC PANEL
ALT: 21 U/L (ref 0–35)
AST: 16 U/L (ref 0–37)
Albumin: 4.5 g/dL (ref 3.5–5.2)
Alkaline Phosphatase: 116 U/L (ref 39–117)
BUN: 12 mg/dL (ref 6–23)
CO2: 27 mEq/L (ref 19–32)
Calcium: 10.2 mg/dL (ref 8.4–10.5)
Chloride: 103 meq/L (ref 96–112)
Creatinine, Ser: 0.89 mg/dL (ref 0.40–1.20)
GFR: 67.14 mL/min (ref >60.00)
Glucose, Bld: 91 mg/dL (ref 70–99)
Potassium: 3.4 meq/L — ABNORMAL LOW (ref 3.5–5.1)
Sodium: 138 meq/L (ref 135–145)
Total Bilirubin: 0.6 mg/dL (ref 0.2–1.2)
Total Protein: 7.7 g/dL (ref 6.0–8.3)

## 2022-10-19 MED ORDER — VALACYCLOVIR HCL 1 G PO TABS: 1000.0000 mg | ORAL_TABLET | Freq: Three times a day (TID) | ORAL | 0 refills | Status: AC

## 2022-10-19 NOTE — Patient Instructions (Signed)
It was great to see you!  Stop your daily valtrex  Start the new valtrex and then you may resume your daily valtrex  Continue antibiotic(s)  Blood work and urine today  Let's follow-up in 2 weeks to check in all of this, sooner if you have concerns.  Take care,  Jarold Motto PA-C

## 2022-10-20 LAB — URINALYSIS, ROUTINE W REFLEX MICROSCOPIC
Bilirubin Urine: NEGATIVE
Hgb urine dipstick: NEGATIVE
Ketones, ur: NEGATIVE
Leukocytes,Ua: NEGATIVE
Nitrite: NEGATIVE
RBC / HPF: NONE SEEN (ref 0–?)
Specific Gravity, Urine: 1.01 (ref 1.000–1.030)
Total Protein, Urine: NEGATIVE
Urine Glucose: NEGATIVE
Urobilinogen, UA: 0.2 (ref 0.0–1.0)
WBC, UA: NONE SEEN (ref 0–?)
pH: 7 (ref 5.0–8.0)

## 2022-10-20 LAB — URINE CULTURE
MICRO NUMBER:: 15362160
Result:: NO GROWTH
SPECIMEN QUALITY:: ADEQUATE

## 2022-10-21 LAB — VITAMIN D 25 HYDROXY (VIT D DEFICIENCY, FRACTURES): VITD: 44.62 ng/mL (ref 30.00–100.00)

## 2022-10-21 LAB — TSH: TSH: 1.27 u[IU]/mL (ref 0.35–5.50)

## 2022-10-21 LAB — IBC + FERRITIN
Ferritin: 65.6 ng/mL (ref 10.0–291.0)
Iron: 67 ug/dL (ref 42–145)
Saturation Ratios: 16.8 % — ABNORMAL LOW (ref 20.0–50.0)
TIBC: 399 ug/dL (ref 250.0–450.0)
Transferrin: 285 mg/dL (ref 212.0–360.0)

## 2022-10-21 LAB — VITAMIN B12: Vitamin B-12: 469 pg/mL (ref 211–911)

## 2022-10-24 ENCOUNTER — Encounter: Payer: Self-pay | Admitting: Physician Assistant

## 2022-10-24 DIAGNOSIS — J0110 Acute frontal sinusitis, unspecified: Secondary | ICD-10-CM

## 2022-10-24 NOTE — Telephone Encounter (Signed)
Please see patient update and note and advise

## 2022-10-25 ENCOUNTER — Ambulatory Visit: Payer: PPO | Admitting: Physical Therapy

## 2022-10-25 MED ORDER — FLUCONAZOLE 150 MG PO TABS: 150.0000 mg | ORAL_TABLET | ORAL | 0 refills | Status: DC | PRN

## 2022-10-26 NOTE — Progress Notes (Signed)
Madison Hopkins is a 67 y.o. female here for a follow up of a pre-existing problem.  History of Present Illness:   Chief Complaint  Patient presents with   Fatigue    Pt says she is still feeling tired, no worse.    HPI  Fatigue Her energy level is normal now since last visit She is eating fish/chicken and vegetables. Going to the gym regularly. Takes Turmeric for inflammation.  The rash on her buttocks as resolved; This has resolved -- took Valtrex 1 g three times daily x 7 days starting on 10/19/22. She would like to start a daily preventative -- used to take this regularly (acyclovir is more cost effective.)   Past Medical History:  Diagnosis Date   Abnormal Pap smear of vagina 07/28/2016   LGSIL; colpo 07/2016 atrophic squamous cells; colpo 07/2017 - atypia   Allergy    Arthritis    Breast cancer (HCC) 2018   Metastatic Left breast   Elevated hemoglobin A1c 07/28/2016   level - 6.1   Endometriosis    GERD (gastroesophageal reflux disease)    HSV-2 infection    Iron deficiency anemia    Low vitamin D level 07/28/2016   level 24.7   Personal history of chemotherapy    finished 10/19   Personal history of radiation therapy    finished 03/2018   Thyroid cancer (HCC)    Thyroid neoplasm      Social History   Tobacco Use   Smoking status: Never   Smokeless tobacco: Never  Vaping Use   Vaping status: Never Used  Substance Use Topics   Alcohol use: Yes    Alcohol/week: 1.0 standard drink of alcohol    Types: 1 Glasses of wine per week    Comment: Social   Drug use: Not Currently    Types: Marijuana    Comment: everyday    Past Surgical History:  Procedure Laterality Date   ABDOMINAL HYSTERECTOMY     BREAST BIOPSY Left 08/09/2017   BREAST LUMPECTOMY Left 01/22/2018   BREAST LUMPECTOMY WITH RADIOACTIVE SEED AND AXILLARY LYMPH NODE DISSECTION Left 01/22/2018   Procedure: LEFT BREAST LUMPECTOMY WITH RADIOACTIVE SEED AND COMPLETE LEFT AXILLARY LYMPH NODE  DISSECTION;  Surgeon: Claud Kelp, MD;  Location: [facility];  Service: General;  Laterality: Left;   CESAREAN SECTION     COLON SURGERY     COLOSTOMY     COLOSTOMY REVERSAL     FOOT SURGERY Right    done spur   OOPHORECTOMY     PORT-A-CATH REMOVAL Right 08/28/2018   Procedure: REMOVAL PORT-A-CATH;  Surgeon: Claud Kelp, MD;  Location: [facility];  Service: General;  Laterality: Right;   PORTACATH PLACEMENT N/A 09/06/2017   Procedure: INSERTION PORT-A-CATH;  Surgeon: Ovidio Kin, MD;  Location[facility];  Service: General;  Laterality: N/A;   SMALL INTESTINE SURGERY     SPINE SURGERY     Injections   THYROIDECTOMY N/A 09/20/2019   Procedure: TOTAL THYROIDECTOMY;  Surgeon: Darnell Level, MD;  Location: [facility];  Service: General;  Laterality: N/A;   TUBAL LIGATION  1995    Family History  Problem Relation Age of Onset   Mental illness Mother    Alcoholism Mother    Cirrhosis Mother    Alcohol abuse Mother    Cancer Father    Lung cancer Father     Allergies  Allergen Reactions   Penicillins Nausea Only, Hives and Nausea And Vomiting  Has patient had a PCN reaction causing immediate rash, facial/tongue/throat swelling, SOB or lightheadedness with hypotension: No Has patient had a PCN reaction causing severe rash involving mucus membranes or skin necrosis: No Has patient had a PCN reaction that required hospitalization: No Has patient had a PCN reaction occurring within the last 10 years: Yes--nausea & headache ONLY If all of the above answers are "NO", then may proceed with Cephalosporin use.  Has patient had a PCN reaction causing immediate rash, facial/tongue/throat swelling, SOB or lightheadedness with hypotension: No Has patient had a PCN reaction causing severe rash involving mucus membranes or skin necrosis: No Has patient had a PCN reaction that required hospitalization: No Has patient had a PCN reaction occurring within the last 10  years: Yes--nausea & headache ONLY If all of the above answers are "NO", then may proceed with Cephalosporin use. Has patient had a PCN reaction causing immediate rash, facial/tongue/throat swelling, SOB or lightheadedness with hypotension: No Has patient had a PCN reaction causing severe rash involving mucus membranes or skin necrosis: No Has patient had a PCN reaction that required hospitalization: No Has patient had a PCN reaction occurring within the last 10 years: Yes--nausea & headache ONLY If all of the above answers are "NO", then may proceed with Cephalosporin use.   Bee Venom    Tramadol Nausea Only    Current Medications:   Current Outpatient Medications:    anastrozole (ARIMIDEX) 1 MG tablet, Take 1 tablet (1 mg total) by mouth daily., Disp: 90 tablet, Rfl: 4   Artificial Tear Solution (OPTI-TEARS OP), Place 1 drop into both eyes 3 (three) times daily as needed (for dry/irritated eyes.)., Disp: , Rfl:    Ascorbic Acid (VITAMIN C) 100 MG CHEW, Chew 100 mg by mouth daily. , Disp: , Rfl:    BORIC ACID EX, Apply topically. , Disp: , Rfl:    fluticasone (FLONASE) 50 MCG/ACT nasal spray, SPRAY 2 SPRAYS INTO EACH NOSTRIL EVERY DAY, Disp: 16 mL, Rfl: 2   ibuprofen (ADVIL) 800 MG tablet, Take 800 mg by mouth 3 (three) times daily as needed., Disp: , Rfl:    ipratropium (ATROVENT) 0.03 % nasal spray, Place 2 sprays into both nostrils every 12 (twelve) hours., Disp: 30 mL, Rfl: 12   levothyroxine (SYNTHROID) 100 MCG tablet, TAKE 1 TABLET BY MOUTH EVERY DAY, Disp: 30 tablet, Rfl: 2   lidocaine (XYLOCAINE) 5 % ointment, Apply 1 Application topically 3 (three) times daily. Uses as needed., Disp: 1.25 g, Rfl: 1   Multiple Vitamins-Minerals (VITAMIN D3 COMPLETE PO), Take 1 tablet by mouth daily. , Disp: , Rfl:    Probiotic Product (ALIGN PO), Take by mouth. , Disp: , Rfl:    Review of Systems:   Review of Systems  Constitutional:  Negative for fever and malaise/fatigue.  HENT:  Positive for  congestion.   Eyes:  Negative for blurred vision.  Respiratory:  Negative for cough and shortness of breath.   Cardiovascular:  Negative for chest pain, palpitations and leg swelling.  Gastrointestinal:  Negative for vomiting.  Musculoskeletal:  Positive for back pain.  Skin:  Negative for rash.  Neurological:  Negative for loss of consciousness and headaches.    Vitals:   Vitals:   11/02/22 1000  BP: 110/70  Pulse: 63  Temp: 97.7 F (36.5 C)  TempSrc: Temporal  SpO2: 98%  Weight: 198 lb (89.8 kg)  Height: 5\' 8"  (1.727 m)     Body mass index is 30.11 kg/m.  Physical Exam:  Physical Exam Vitals and nursing note reviewed.  Constitutional:      General: She is not in acute distress.    Appearance: She is well-developed. She is not ill-appearing or toxic-appearing.  Cardiovascular:     Rate and Rhythm: Normal rate and regular rhythm.     Pulses: Normal pulses.     Heart sounds: Normal heart sounds, S1 normal and S2 normal.  Pulmonary:     Effort: Pulmonary effort is normal.     Breath sounds: Normal breath sounds.  Skin:    General: Skin is warm and dry.  Neurological:     Mental Status: She is alert.     GCS: GCS eye subscore is 4. GCS verbal subscore is 5. GCS motor subscore is 6.  Psychiatric:        Speech: Speech normal.        Behavior: Behavior normal. Behavior is cooperative.     Assessment and Plan:   Fatigue, unspecified type Symptoms have greatly improved Continue to monitor and follow up if this changes  HSV-2 Will restart daily suppressive therapy of acyclovir 400 mg twice daily Follow-up yearly  Encounter for immunization Flu shot provided today   I,Alexander Ruley,acting as a scribe for [employer], PA.,have documented all relevant documentation on the behalf of Jarold Motto, PA,as directed by  Jarold Motto, PA while in the presence of Jarold Motto, [place].   I, Jarold Motto, [place], have reviewed all documentation for this  visit. The documentation on 11/02/22 for the exam, diagnosis, procedures, and orders are all accurate and complete.    Jarold Motto, PA-C

## 2022-11-02 ENCOUNTER — Encounter: Payer: Self-pay | Admitting: Physician Assistant

## 2022-11-02 ENCOUNTER — Ambulatory Visit (INDEPENDENT_AMBULATORY_CARE_PROVIDER_SITE_OTHER): Payer: PPO | Admitting: Physician Assistant

## 2022-11-02 VITALS — BP 110/70 | HR 63 | Temp 97.7°F | Ht 68.0 in | Wt 198.0 lb

## 2022-11-02 DIAGNOSIS — R5383 Other fatigue: Secondary | ICD-10-CM | POA: Diagnosis not present

## 2022-11-02 DIAGNOSIS — B009 Herpesviral infection, unspecified: Secondary | ICD-10-CM

## 2022-11-02 DIAGNOSIS — Z23 Encounter for immunization: Secondary | ICD-10-CM

## 2022-11-02 MED ORDER — ACYCLOVIR 400 MG PO TABS
400.0000 mg | ORAL_TABLET | Freq: Two times a day (BID) | ORAL | 1 refills | Status: DC
Start: 2022-11-02 — End: 2023-09-20

## 2022-11-16 NOTE — Therapy (Signed)
OUTPATIENT PHYSICAL THERAPY  UPPER EXTREMITY ONCOLOGY EVALUATION  Patient Name: Madison Hopkins MRN: [id number] DOB:1955-10-03, 67 y.o., female Today's Date: 11/17/2022  END OF SESSION:  PT End of Session - 11/17/22 1407     Visit Number 1    Number of Visits 5    Date for PT Re-Evaluation 12/22/22    PT Start Time 1200    PT Stop Time 1252    PT Time Calculation (min) 52 min    Activity Tolerance Patient tolerated treatment well    Behavior During Therapy [facility] for tasks assessed/performed             Past Medical History:  Diagnosis Date   Abnormal Pap smear of vagina 07/28/2016   LGSIL; colpo 07/2016 atrophic squamous cells; colpo 07/2017 - atypia   Allergy    Arthritis    Breast cancer (HCC) 2018   Metastatic Left breast   Elevated hemoglobin A1c 07/28/2016   level - 6.1   Endometriosis    GERD (gastroesophageal reflux disease)    HSV-2 infection    Iron deficiency anemia    Low vitamin D level 07/28/2016   level 24.7   Personal history of chemotherapy    finished 10/19   Personal history of radiation therapy    finished 03/2018   Thyroid cancer (HCC)    Thyroid neoplasm    Past Surgical History:  Procedure Laterality Date   ABDOMINAL HYSTERECTOMY     BREAST BIOPSY Left 08/09/2017   BREAST LUMPECTOMY Left 01/22/2018   BREAST LUMPECTOMY WITH RADIOACTIVE SEED AND AXILLARY LYMPH NODE DISSECTION Left 01/22/2018   Procedure: LEFT BREAST LUMPECTOMY WITH RADIOACTIVE SEED AND COMPLETE LEFT AXILLARY LYMPH NODE DISSECTION;  Surgeon: Claud Kelp, MD;  Location: [facility];  Service: General;  Laterality: Left;   CESAREAN SECTION     COLON SURGERY     COLOSTOMY     COLOSTOMY REVERSAL     FOOT SURGERY Right    done spur   OOPHORECTOMY     PORT-A-CATH REMOVAL Right 08/28/2018   Procedure: REMOVAL PORT-A-CATH;  Surgeon: Claud Kelp, MD;  Location: [facility];  Service: General;  Laterality: Right;   PORTACATH PLACEMENT N/A  09/06/2017   Procedure: INSERTION PORT-A-CATH;  Surgeon: Ovidio Kin, MD;  Location: [facility] OR;  Service: General;  Laterality: N/A;   SMALL INTESTINE SURGERY     SPINE SURGERY     Injections   THYROIDECTOMY N/A 09/20/2019   Procedure: TOTAL THYROIDECTOMY;  Surgeon: Darnell Level, MD;  Location: [facility];  Service: General;  Laterality: N/A;   TUBAL LIGATION  1995   Patient Active Problem List   Diagnosis Date Noted   H/O papillary adenocarcinoma of thyroid 11/17/2022   Acute non-recurrent frontal sinusitis 10/14/2022   Atrophic vaginitis 03/19/2022   History of thyroid cancer 09/15/2020   Papillary thyroid carcinoma (HCC) 04/07/2020   Post-operative hypothyroidism 10/02/2019   S/P total thyroidectomy 10/02/2019   Neoplasm of uncertain behavior of thyroid gland 09/16/2019   Multiple thyroid nodules 09/16/2019   COVID-19 virus infection 02/20/2019   Environmental allergies 01/12/2018   Malignant tumor of breast (HCC) 01/12/2018   Malignant neoplastic disease (HCC) 01/12/2018   Port-A-Cath in place 11/09/2017   Malignant neoplasm of upper-outer quadrant of left breast in female, estrogen receptor positive (HCC) 08/10/2017    PCP: Jarold Motto, PA  REFERRING PROVIDER: Lillard Anes NP  REFERRING DIAG:  Diagnosis  C50.412,Z17.0 (ICD-10-CM) - Malignant neoplasm of upper-outer quadrant of left breast in  female, estrogen receptor positive (HCC)    THERAPY DIAG:  Malignant neoplasm of upper-outer quadrant of left breast in female, estrogen receptor positive (HCC)  Lymphedema, not elsewhere classified  Pain in left arm  ONSET DATE: 2020  Rationale for Evaluation and Treatment: Rehabilitation  SUBJECTIVE:                                                                                                                                                                                           SUBJECTIVE STATEMENT: When I lay on that left side it is hurting.  I haven't used that  machine.  The pain is under the arm.  The breast is a little tender and has been since surgery.  I don't think the arm is swelling any worse.  I have been trying to do my massage but I don't think I'm doing it right.  I will need a new sleeve.    PERTINENT HISTORY: left breast cancer, completed neoadjuvant chemo, Left lumpectomy on 01/22/2018 with a total of 11 axillary lymph nodes removed (1+), adjuvant radiation completed 04/20/18, started anastrozole 05/30/2018, status post total thyroidectomy 09/20/2019 for papillary thyroid carcinoma.   PAIN:  Are you having pain? Yes - not really sitting here at rest  NPRS scale: 3-4/10 laying on that side Pain location: left arm Pain orientation: Left  PAIN TYPE: dull, numb Pain description: intermittent  Aggravating factors: lay on that side   Relieving factors: I got a new mattress that helped,   PRECAUTIONS: LT UE lymphedema   RED FLAGS: None   WEIGHT BEARING RESTRICTIONS: No  FALLS:  Has patient fallen in last 6 months? No  OCCUPATION: retired  LEISURE: I have been going to the gym and doing my weights, but I'm not doing it much anymore.   HAND DOMINANCE: right   PRIOR LEVEL OF FUNCTION: Independent  PATIENT GOALS: get back on track   OBJECTIVE:  COGNITION: Overall cognitive status: Within functional limits for tasks assessed   PALPATION: Non pitting  OBSERVATIONS / OTHER ASSESSMENTS: Lt UE slightly larger at the elbow but not hand or lower arm  POSTURE: WNL  UPPER EXTREMITY AROM/PROM:  A/PROM LEFT   eval  Shoulder extension   Shoulder flexion 140 - pull back of the upper arm  Shoulder abduction 160 - tight upper arm   Shoulder internal rotation Behind the back WNL  Shoulder external rotation Behind the head WNL    (Blank rows = not tested)  LYMPHEDEMA ASSESSMENTS:   LANDMARK RIGHT  eval  At axilla    15 cm proximal to olecranon process 36.2  10 cm proximal to olecranon  process 33  Olecranon process 28.7  15  cm proximal to ulnar styloid process 26.2  10 cm proximal to ulnar styloid process 21.9  Just proximal to ulnar styloid process 17.5  Across hand at thumb web space 20.7  At base of 2nd digit 7.0  (Blank rows = not tested)   LANDMARK LEFT  eval  At axilla    15 cm proximal to olecranon process 36  10 cm proximal to olecranon process 35.5  Olecranon process 31.7  15 cm proximal to ulnar styloid process 26.6  10 cm proximal to ulnar styloid process 22.1  Just proximal to ulnar styloid process 17.7  Across hand at thumb web space 21.4  At base of 2nd digit 7.1  (Blank rows = not tested) 9 length    difference.    L-DEX LYMPHEDEMA SCREENING: The patient was assessed using the L-Dex machine today to produce a lymphedema index baseline score. The patient will be reassessed on a regular basis (typically every 3 months) to obtain new L-Dex scores. If the score is > 6.5 points away from his/her baseline score indicating onset of subclinical lymphedema, it will be recommended to wear a compression garment for 4 weeks, 12 hours per day and then be reassessed. If the score continues to be > 6.5 points from baseline at reassessment, we will initiate lymphedema treatment. Assessing in this manner has a 95% rate of preventing clinically significant lymphedema.  LLIS: 45%  TODAY'S TREATMENT:                                                                                                                                          DATE: 11/17/22 Eval only Education on POC   PATIENT EDUCATION:  Education details: per today's note Person educated: Patient Education method: Explanation Education comprehension: verbalized understanding  HOME EXERCISE PROGRAM:   ASSESSMENT:  CLINICAL IMPRESSION: Patient is a 67 y.o. female who was seen today for physical therapy evaluation and treatment for her continued LT UE pain and edema.  Pt is known to this clinic and has a flexitouch  but no current sleeve.  She reports that she is trying to do the massage as she remembers it but demonstrates it incorrectly.  Brief redirection on which direction to travel.  Pt has a low SOZO score of 4.6 but with no baseline.  Her volumes are 255 ml difference which is over the difference cut off for lymphedema.  The only arm measurements that are >2cm difference are at the olecranon and 10proximal to the olecranon which is where pt is noting she feel more pain and stiffness lately.  Pt would benefit from PT at this time to obtain a new sleeve, re-teach her self MLD or use her pump with tune up as needed, and to return to exercise.    OBJECTIVE IMPAIRMENTS: decreased activity tolerance, decreased knowledge of  use of DME, decreased ROM, and increased edema.   ACTIVITY LIMITATIONS: carrying and lifting  PARTICIPATION LIMITATIONS: community activity  PERSONAL FACTORS: Time since onset of injury/illness/exacerbation are also affecting patient's functional outcome.   REHAB POTENTIAL: Excellent  CLINICAL DECISION MAKING: Stable/uncomplicated  EVALUATION COMPLEXITY: Low  GOALS: Goals reviewed with patient? Yes  SHORT TERM GOALS=LTGs: Target date: 12/24/22  Pt will be measured for and new compression ordered for lymphedema management Baseline: Goal status: INITIAL  2.  Pt will improve Lt UE ROM to no significant pull in the upper arm with overhead flexion Baseline:  Goal status: INITIAL  3.  Pt will be ind with self MLD for the LT UE for lymphedema management Baseline:  Goal status: INITIAL  4. Pt will be ind with strength ABC or similar for weight lifting instruction   INITIAL   PLAN:  PT FREQUENCY: 1x/week  PT DURATION: other: 5 weeks   PLANNED INTERVENTIONS: Therapeutic exercises, Neuromuscular re-education, Patient/Family education, Self Care, Taping, Manual therapy, and Re-evaluation  PLAN FOR NEXT SESSION: Review Lt UE self MLD, give pt instructions on how to find  a DME provider to order sleeve and choose sleeve to order - mondi/secure vs dynamic?, STM/PROM working into strength ABC  Idamae Lusher, PT 11/17/2022, 2:08 PM

## 2022-11-17 ENCOUNTER — Ambulatory Visit: Payer: PPO | Admitting: Internal Medicine

## 2022-11-17 ENCOUNTER — Ambulatory Visit: Payer: PPO | Attending: Adult Health | Admitting: Rehabilitation

## 2022-11-17 ENCOUNTER — Other Ambulatory Visit: Payer: Self-pay

## 2022-11-17 ENCOUNTER — Encounter: Payer: Self-pay | Admitting: Internal Medicine

## 2022-11-17 ENCOUNTER — Encounter: Payer: Self-pay | Admitting: Rehabilitation

## 2022-11-17 VITALS — BP 124/76 | HR 67 | Ht 68.0 in | Wt 198.0 lb

## 2022-11-17 DIAGNOSIS — C50412 Malignant neoplasm of upper-outer quadrant of left female breast: Secondary | ICD-10-CM | POA: Insufficient documentation

## 2022-11-17 DIAGNOSIS — E890 Postprocedural hypothyroidism: Secondary | ICD-10-CM | POA: Diagnosis not present

## 2022-11-17 DIAGNOSIS — Z85850 Personal history of malignant neoplasm of thyroid: Secondary | ICD-10-CM | POA: Diagnosis not present

## 2022-11-17 DIAGNOSIS — M79602 Pain in left arm: Secondary | ICD-10-CM | POA: Insufficient documentation

## 2022-11-17 DIAGNOSIS — I890 Lymphedema, not elsewhere classified: Secondary | ICD-10-CM | POA: Insufficient documentation

## 2022-11-17 DIAGNOSIS — Z170 Estrogen receptor positive status [ER+]: Secondary | ICD-10-CM | POA: Insufficient documentation

## 2022-11-17 MED ORDER — LEVOTHYROXINE SODIUM 100 MCG PO TABS: 100.0000 ug | ORAL_TABLET | Freq: Every day | ORAL | 3 refills | Status: DC

## 2022-11-17 NOTE — Patient Instructions (Signed)

## 2022-11-17 NOTE — Progress Notes (Signed)
Name: Madison Hopkins  MRN/ DOB: [id number], 12-Jan-1956    Age/ Sex: 67 y.o., female     PCP: Jarold Motto, PA   Reason for Endocrinology Evaluation: [facility]      Initial Endocrinology Clinic Visit: 10/02/2019    PATIENT IDENTIFIER: Madison Hopkins is a 67 y.o., female with a past medical history of Breast Ca and PTC . She has followed with [facility] since 10/02/2019 for consultative assistance with management of her PTC.   HISTORICAL SUMMARY:   Pt has been referred by Dr. Darnell Level.  She had an incidental finding of a left thyroid nodule ~1.5 cm in diameter on neck MRI during evaluation of chronic neck pain.  This nodule was noted to enlarge to 2.3 cm max diameter on cervical spine MRI 04/2019.   She is S/P FNA in 06/2019 - with left mid pole cytology report of Suspicious for malignancy (Bethesda category V), no afirma found for this in the chart. And a left inferior pole cytology of Atypia of undetermined significance (Bethesda category III) , with benign afirma     She is S/P Total thyroidectomy  On 7/23rd/ 2021 with a 1.3 cm papillary thyroid carcinoma on the left with no invasion, free margins and NO L.N submitted.   Thyroid ultrasound showed a subcentimeter hypoechoic right thyroid bed focus 0.8 cm by by March 2023 the patient developed a new 0.7 left thyroid bed focus    She is s/p RAI remnant ablation with 53 mCi 07/2021   Posttreatment scan showed right thyroid remnant but no distant metastasis on 08/27/2021   Thyroglobulin remains low at 0.1 NG/mL with undetectable thyroglobulin antibodies  SUBJECTIVE:    Today (11/17/2022):  [name] is here for total thyroidectomy secondary to PTC.   She had headaches over the spring but these resolved with OTC vitamins and Abx  Denies local neck swelling  She continues to follow up with PT for left arm  lymphedema   Denies palpitations  Denies tremors  Denies constipation or diarrhea  NO biotin    Levothyroxine 100 mcg daily    HISTORY:  Past Medical History:  Past Medical History:  Diagnosis Date   Abnormal Pap smear of vagina 07/28/2016   LGSIL; colpo 07/2016 atrophic squamous cells; colpo 07/2017 - atypia   Allergy    Arthritis    Breast cancer (HCC) 2018   Metastatic Left breast   Elevated hemoglobin A1c 07/28/2016   level - 6.1   Endometriosis    GERD (gastroesophageal reflux disease)    HSV-2 infection    Iron deficiency anemia    Low vitamin D level 07/28/2016   level 24.7   Personal history of chemotherapy    finished 10/19   Personal history of radiation therapy    finished 03/2018   Thyroid cancer (HCC)    Thyroid neoplasm    Past Surgical History:  Past Surgical History:  Procedure Laterality Date   ABDOMINAL HYSTERECTOMY     BREAST BIOPSY Left 08/09/2017   BREAST LUMPECTOMY Left 01/22/2018   BREAST LUMPECTOMY WITH RADIOACTIVE SEED AND AXILLARY LYMPH NODE DISSECTION Left 01/22/2018   Procedure: LEFT BREAST LUMPECTOMY WITH RADIOACTIVE SEED AND COMPLETE LEFT AXILLARY LYMPH NODE DISSECTION;  Surgeon: Claud Kelp, MD;  Location: [facility];  Service: General;  Laterality: Left;   CESAREAN SECTION     COLON SURGERY     COLOSTOMY     COLOSTOMY REVERSAL     FOOT SURGERY Right  done spur   OOPHORECTOMY     PORT-A-CATH REMOVAL Right 08/28/2018   Procedure: REMOVAL PORT-A-CATH;  Surgeon: Claud Kelp, MD;  Location: [facility];  Service: General;  Laterality: Right;   PORTACATH PLACEMENT N/A 09/06/2017   Procedure: INSERTION PORT-A-CATH;  Surgeon: Ovidio Kin, MD;  Location: [facility];  Service: General;  Laterality: N/A;   SMALL INTESTINE SURGERY     SPINE SURGERY     Injections   THYROIDECTOMY N/A 09/20/2019   Procedure: TOTAL THYROIDECTOMY;  Surgeon: Darnell Level, MD;  Location: [facility];  Service: General;  Laterality: N/A;   TUBAL LIGATION  1995   Social History:  reports that she has never smoked. She has never  used smokeless tobacco. She reports current alcohol use of about 1.0 standard drink of alcohol per week. She reports that she does not currently use drugs after having used the following drugs: Marijuana. Family History:  Family History  Problem Relation Age of Onset   Mental illness Mother    Alcoholism Mother    Cirrhosis Mother    Alcohol abuse Mother    Cancer Father    Lung cancer Father      HOME MEDICATIONS: Allergies as of 11/17/2022       Reactions   Penicillins Nausea Only, Hives, Nausea And Vomiting   Has patient had a PCN reaction causing immediate rash, facial/tongue/throat swelling, SOB or lightheadedness with hypotension: No Has patient had a PCN reaction causing severe rash involving mucus membranes or skin necrosis: No Has patient had a PCN reaction that required hospitalization: No Has patient had a PCN reaction occurring within the last 10 years: Yes--nausea & headache ONLY If all of the above answers are "NO", then may proceed with Cephalosporin use. Has patient had a PCN reaction causing immediate rash, facial/tongue/throat swelling, SOB or lightheadedness with hypotension: No Has patient had a PCN reaction causing severe rash involving mucus membranes or skin necrosis: No Has patient had a PCN reaction that required hospitalization: No Has patient had a PCN reaction occurring within the last 10 years: Yes--nausea & headache ONLY If all of the above answers are "NO", then may proceed with Cephalosporin use. Has patient had a PCN reaction causing immediate rash, facial/tongue/throat swelling, SOB or lightheadedness with hypotension: No Has patient had a PCN reaction causing severe rash involving mucus membranes or skin necrosis: No Has patient had a PCN reaction that required hospitalization: No Has patient had a PCN reaction occurring within the last 10 years: Yes--nausea & headache ONLY If all of the above answers are "NO", then may proceed with Cephalosporin use.    Bee Venom    Tramadol Nausea Only        Medication List        Accurate as of November 17, 2022 10:43 AM. If you have any questions, ask your nurse or doctor.          acyclovir 400 MG tablet Commonly known as: ZOVIRAX Take 1 tablet (400 mg total) by mouth 2 (two) times daily.   ALIGN PO Take by mouth.   anastrozole 1 MG tablet Commonly known as: ARIMIDEX Take 1 tablet (1 mg total) by mouth daily.   BORIC ACID EX Apply topically.   fluticasone 50 MCG/ACT nasal spray Commonly known as: FLONASE SPRAY 2 SPRAYS INTO EACH NOSTRIL EVERY DAY   ibuprofen 800 MG tablet Commonly known as: ADVIL Take 800 mg by mouth 3 (three) times daily as needed.   ipratropium 0.03 % nasal  spray Commonly known as: ATROVENT Place 2 sprays into both nostrils every 12 (twelve) hours.   levothyroxine 100 MCG tablet Commonly known as: SYNTHROID TAKE 1 TABLET BY MOUTH EVERY DAY   lidocaine 5 % ointment Commonly known as: XYLOCAINE Apply 1 Application topically 3 (three) times daily. Uses as needed.   OPTI-TEARS OP Place 1 drop into both eyes 3 (three) times daily as needed (for dry/irritated eyes.).   Vitamin C 100 MG Chew Chew 100 mg by mouth daily.   VITAMIN D3 COMPLETE PO Take 1 tablet by mouth daily.          OBJECTIVE:   PHYSICAL EXAM: VS:BP 124/76 (BP Location: Right Arm, Patient Position: Sitting, Cuff Size: Large)   Pulse 67   Ht 5\' 8"  (1.727 m)   Wt 198 lb (89.8 kg)   LMP  (LMP Unknown)   SpO2 97%   BMI 30.11 kg/m    EXAM: General: Pt appears well and is in NAD  Neck: General: Supple without adenopathy. Thyroid: Surgically removed . No nodules appreciated.  Lungs: Clear with good BS bilat with no rales, rhonchi, or wheezes  Heart: Auscultation: RRR.  Abdomen: Normoactive bowel sounds, soft, nontender, without masses or organomegaly palpable  Extremities:  BL LE: No pretibial edema normal ROM and strength.  Mental Status: Judgment, insight:  Intact Orientation: Oriented to time, place, and person Mood and affect: No depression, anxiety, or agitation     DATA REVIEWED:   Latest Reference Range & Units 10/19/22 13:35  TSH 0.35 - 5.50 uIU/mL 1.27    Latest Reference Range & Units 05/17/22 10:43  Thyroglobulin ng/mL 0.1 (L)  Thyroglobulin Ab < or = 1 IU/mL <1  (L): Data is abnormally low   Pathology report 09/20/2019 THYROID, TOTAL THYROIDECTOMY:  - Papillary thyroid carcinoma, 1.3 cm.  - Margins not involved.  - Multiple adenomatous nodules.  - See oncology table.   ONCOLOGY TABLE:  THYROID GLAND:  Procedure: Total thyroidectomy.  Tumor Focality: Unifocal.  Tumor Site: Left lobe, mid.  Tumor Size: 1.3 x 1 x 1 cm.  Histologic Type: Papillary thyroid carcinoma.  Margins: Free of tumor.  Angioinvasion: Not identified.  Lymphatic Invasion: Not identified.  Extrathyroidal extension: Not identified.  Regional Lymph Nodes: No lymph nodes submitted.  Pathologic Stage Classification (pTNM, AJCC 8th Edition): pT1b, pNX   Thyroid Ultrasound 06/02/2022  FINDINGS: Minimal residual tissue seen at the level of the isthmus measuring 0.2 cm in thickness. Otherwise, no residual thyroid tissue is identified in the thyroidectomy bed.   No enlarged lymph nodes seen in the imaged portions of the neck.   IMPRESSION: 1. No evidence of recurrent or residual thyroid malignancy. 2. Minimal residual tissue seen in the region of the isthmus. Previously seen hypoechoic nodules in the right thyroidectomy bed are no longer identified.       Post Rx WBS 08/27/2021  FINDINGS: Uptake at thyroid remnant greater on RIGHT.   No additional sites of abnormal radio iodine accumulation identified.   Small amount of excreted tracer within urinary bladder.   IMPRESSION: Small thyroid remnant.   No scintigraphic evidence of iodine avid metastatic thyroid cancer.   ASSESSMENT / PLAN / RECOMMENDATIONS:   Hx of papillary Thyroid  carcinoma (pT1b, pNX) Stage I:     - S/P total thyroidectomy 09/20/2019 - TSH goal 0.5-2.0 uIU/mL  -Her previous thyroid bed ultrasound shows a stable subcentimeter right thyroid focus, by March 2023 and new subcentimeter left thyroid bed focus has been noted - She  is S/P remnant ablation 08/27/2021, with interval decrease in the size of residual tissue in the right thyroid resection bed 10/2021 -Repeat thyroid bed ultrasound 05/2022 revealed that the previously seen right thyroid bed nodule was no longer identified -TSH remains within range -An order for thyroid bed ultrasound was placed   2. Post-operative Hypothyroidism      -She is clinically euthyroid  -Her most recent TSH at PCPs office was within normal range -No change   Medications  -Continue levothyroxine 100 mcg daily     F/U in 6 months    Signed electronically by: Lyndle Herrlich, MD  [facility]  [facility] [address] [place] [place], [place] [address] Phone: [phone] FAX: [phone]      CC: Jarold Motto, [place] [address] [place] [address] Phone: [phone]  Fax: [phone]   Return to Endocrinology clinic as below: Future Appointments  Date Time Provider Department Center  11/17/2022 12:00 PM Idamae Lusher, PT [facility] None  12/22/2022 11:30 AM Rachel Moulds, MD [facility] None  01/11/2023  3:00 PM Patton Salles, MD [facility] None  07/06/2023  1:30 PM [facility] ANNUAL WELLNESS VISIT 1 [facility]

## 2022-11-22 DIAGNOSIS — M5412 Radiculopathy, cervical region: Secondary | ICD-10-CM | POA: Diagnosis not present

## 2022-11-28 ENCOUNTER — Ambulatory Visit
Admission: RE | Admit: 2022-11-28 | Discharge: 2022-11-28 | Disposition: A | Discharge status: Home / Self Care | Payer: PPO | Source: Ambulatory Visit | Attending: Internal Medicine | Admitting: Internal Medicine

## 2022-11-28 DIAGNOSIS — C73 Malignant neoplasm of thyroid gland: Secondary | ICD-10-CM | POA: Diagnosis not present

## 2022-11-28 DIAGNOSIS — Z85850 Personal history of malignant neoplasm of thyroid: Secondary | ICD-10-CM

## 2022-11-30 ENCOUNTER — Ambulatory Visit: Payer: PPO | Attending: Adult Health | Admitting: Rehabilitation

## 2022-11-30 ENCOUNTER — Encounter: Payer: Self-pay | Admitting: Rehabilitation

## 2022-11-30 DIAGNOSIS — M79602 Pain in left arm: Secondary | ICD-10-CM | POA: Diagnosis not present

## 2022-11-30 DIAGNOSIS — C50412 Malignant neoplasm of upper-outer quadrant of left female breast: Secondary | ICD-10-CM | POA: Diagnosis not present

## 2022-11-30 DIAGNOSIS — I890 Lymphedema, not elsewhere classified: Secondary | ICD-10-CM | POA: Insufficient documentation

## 2022-11-30 DIAGNOSIS — Z170 Estrogen receptor positive status [ER+]: Secondary | ICD-10-CM | POA: Diagnosis not present

## 2022-11-30 NOTE — Patient Instructions (Signed)
Deep Effective Breath   1.) Standing, sitting, or laying down, place both hands on the belly. Take a deep breath IN, expanding the belly; then breath OUT, contracting the belly.   Copyright  VHI. All rights reserved.  Axilla to Axilla - Sweep   2.) On both side make 5 circles in the armpits  3.) then pump _5__ times from involved armpit across chest to uninvolved armpit, making a pathway. **Always go left to right    Axilla to Inguinal Nodes - Sweep   4.) On left side, make 5 circles at groin at panty line,  5.)  then pump _5__ times from armpit along side of trunk to outer hip, making your other pathway. Do __1_ time per day. **Only push down the side the side of the body   Copyright  VHI. All rights reserved.  Arm Posterior: Elbow to Shoulder - Sweep   6.) Pump _5__ times from back of elbow to top of shoulder.   7.) Then inner to outer upper arm _5_ times,   ARM: Volar Wrist to Elbow - Sweep   8.) Pump or stationary circles _5__ times from wrist to elbow making sure to do both sides of the forearm.   Copyright  VHI. All rights reserved.  ARM: Dorsum of Hand to Shoulder - Sweep   9.) Pump or stationary circles _5__ times on back of hand including knuckle spaces and individual fingers if needed working up towards the wrist,   10.) then retrace all your steps working back up the forearm, doing both sides; upper outer arm and back to your pathways _2-3_ times each. Then do 5 circles again at both armpit and involved groin where you started! Good job!!   Copyright  VHI. All rights reserved.     u

## 2022-11-30 NOTE — Therapy (Signed)
OUTPATIENT PHYSICAL THERAPY  UPPER EXTREMITY ONCOLOGY TREATMENT  Patient Name: Madison Hopkins MRN: [id number] DOB:11-19-1955, 67 y.o., female Today's Date: 11/30/2022  END OF SESSION:  PT End of Session - 11/30/22 1057     Visit Number 2    Number of Visits 5    Date for PT Re-Evaluation 12/22/22    PT Start Time 1100    PT Stop Time 1148    PT Time Calculation (min) 48 min    Activity Tolerance Patient tolerated treatment well    Behavior During Therapy [facility] for tasks assessed/performed             Past Medical History:  Diagnosis Date   Abnormal Pap smear of vagina 07/28/2016   LGSIL; colpo 07/2016 atrophic squamous cells; colpo 07/2017 - atypia   Allergy    Arthritis    Breast cancer (HCC) 2018   Metastatic Left breast   Elevated hemoglobin A1c 07/28/2016   level - 6.1   Endometriosis    GERD (gastroesophageal reflux disease)    HSV-2 infection    Iron deficiency anemia    Low vitamin D level 07/28/2016   level 24.7   Personal history of chemotherapy    finished 10/19   Personal history of radiation therapy    finished 03/2018   Thyroid cancer (HCC)    Thyroid neoplasm    Past Surgical History:  Procedure Laterality Date   ABDOMINAL HYSTERECTOMY     BREAST BIOPSY Left 08/09/2017   BREAST LUMPECTOMY Left 01/22/2018   BREAST LUMPECTOMY WITH RADIOACTIVE SEED AND AXILLARY LYMPH NODE DISSECTION Left 01/22/2018   Procedure: LEFT BREAST LUMPECTOMY WITH RADIOACTIVE SEED AND COMPLETE LEFT AXILLARY LYMPH NODE DISSECTION;  Surgeon: Claud Kelp, MD;  Location: [facility];  Service: General;  Laterality: Left;   CESAREAN SECTION     COLON SURGERY     COLOSTOMY     COLOSTOMY REVERSAL     FOOT SURGERY Right    done spur   OOPHORECTOMY     PORT-A-CATH REMOVAL Right 08/28/2018   Procedure: REMOVAL PORT-A-CATH;  Surgeon: Claud Kelp, MD;  Location: [facility];  Service: General;  Laterality: Right;   PORTACATH PLACEMENT N/A  09/06/2017   Procedure: INSERTION PORT-A-CATH;  Surgeon: Ovidio Kin, MD;  Location: [facility];  Service: General;  Laterality: N/A;   SMALL INTESTINE SURGERY     SPINE SURGERY     Injections   THYROIDECTOMY N/A 09/20/2019   Procedure: TOTAL THYROIDECTOMY;  Surgeon: Darnell Level, MD;  Location: [facility];  Service: General;  Laterality: N/A;   TUBAL LIGATION  1995   Patient Active Problem List   Diagnosis Date Noted   H/O papillary adenocarcinoma of thyroid 11/17/2022   Acute non-recurrent frontal sinusitis 10/14/2022   Atrophic vaginitis 03/19/2022   History of thyroid cancer 09/15/2020   Papillary thyroid carcinoma (HCC) 04/07/2020   Post-operative hypothyroidism 10/02/2019   S/P total thyroidectomy 10/02/2019   Neoplasm of uncertain behavior of thyroid gland 09/16/2019   Multiple thyroid nodules 09/16/2019   COVID-19 virus infection 02/20/2019   Environmental allergies 01/12/2018   Malignant tumor of breast (HCC) 01/12/2018   Malignant neoplastic disease (HCC) 01/12/2018   Port-A-Cath in place 11/09/2017   Malignant neoplasm of upper-outer quadrant of left breast in female, estrogen receptor positive (HCC) 08/10/2017    PCP: Jarold Motto, PA  REFERRING PROVIDER: Lillard Anes NP  REFERRING DIAG:  Diagnosis  C50.412,Z17.0 (ICD-10-CM) - Malignant neoplasm of upper-outer quadrant of left breast in  female, estrogen receptor positive (HCC)    THERAPY DIAG:  Malignant neoplasm of upper-outer quadrant of left breast in female, estrogen receptor positive (HCC)  Pain in left arm  Lymphedema, not elsewhere classified  ONSET DATE: 2020  Rationale for Evaluation and Treatment: Rehabilitation  SUBJECTIVE:                                                                                                                                                                                           SUBJECTIVE STATEMENT: Nothing new  EVAL: When I lay on that left side it is hurting.  I  haven't used that machine.  The pain is under the arm.  The breast is a little tender and has been since surgery.  I don't think the arm is swelling any worse.  I have been trying to do my massage but I don't think I'm doing it right.  I will need a new sleeve.    PERTINENT HISTORY: left breast cancer, completed neoadjuvant chemo, Left lumpectomy on 01/22/2018 with a total of 11 axillary lymph nodes removed (1+), adjuvant radiation completed 04/20/18, started anastrozole 05/30/2018, status post total thyroidectomy 09/20/2019 for papillary thyroid carcinoma.   PAIN:  Are you having pain? Yes - not really sitting here at rest  NPRS scale: 3-4/10 laying on that side Pain location: left arm Pain orientation: Left  PAIN TYPE: dull, numb Pain description: intermittent  Aggravating factors: lay on that side   Relieving factors: I got a new mattress that helped,   PRECAUTIONS: LT UE lymphedema   RED FLAGS: None   WEIGHT BEARING RESTRICTIONS: No  FALLS:  Has patient fallen in last 6 months? No  OCCUPATION: retired  LEISURE: I have been going to the gym and doing my weights, but I'm not doing it much anymore.   HAND DOMINANCE: right   PRIOR LEVEL OF FUNCTION: Independent  PATIENT GOALS: get back on track   OBJECTIVE:  COGNITION: Overall cognitive status: Within functional limits for tasks assessed   PALPATION: Non pitting  OBSERVATIONS / OTHER ASSESSMENTS: Lt UE slightly larger at the elbow but not hand or lower arm  POSTURE: WNL  UPPER EXTREMITY AROM/PROM:  A/PROM LEFT   eval  Shoulder extension   Shoulder flexion 140 - pull back of the upper arm  Shoulder abduction 160 - tight upper arm   Shoulder internal rotation Behind the back WNL  Shoulder external rotation Behind the head WNL    (Blank rows = not tested)  LYMPHEDEMA ASSESSMENTS:   LANDMARK RIGHT  eval  At axilla    15 cm proximal to olecranon process 36.2  10  cm proximal to olecranon process 33  Olecranon  process 28.7  15 cm proximal to ulnar styloid process 26.2  10 cm proximal to ulnar styloid process 21.9  Just proximal to ulnar styloid process 17.5  Across hand at thumb web space 20.7  At base of 2nd digit 7.0  (Blank rows = not tested)   LANDMARK LEFT  eval  At axilla    15 cm proximal to olecranon process 36  10 cm proximal to olecranon process 35.5  Olecranon process 31.7  15 cm proximal to ulnar styloid process 26.6  10 cm proximal to ulnar styloid process 22.1  Just proximal to ulnar styloid process 17.7  Across hand at thumb web space 21.4  At base of 2nd digit 7.1  (Blank rows = not tested) 9 length    difference.    L-DEX LYMPHEDEMA SCREENING: The patient was assessed using the L-Dex machine today to produce a lymphedema index baseline score. The patient will be reassessed on a regular basis (typically every 3 months) to obtain new L-Dex scores. If the score is > 6.5 points away from his/her baseline score indicating onset of subclinical lymphedema, it will be recommended to wear a compression garment for 4 weeks, 12 hours per day and then be reassessed. If the score continues to be > 6.5 points from baseline at reassessment, we will initiate lymphedema treatment. Assessing in this manner has a 95% rate of preventing clinically significant lymphedema.  LLIS: 45%  TODAY'S TREATMENT:                                                                                                                                          DATE:  11/30/22 Measured pt for sleeve. She would like to order a Juzo dynamic size IV Max long in Rose tie dye and a black. With no hand piece.  Will submit to [employer] platform today as able.   Self MLD education in seated: per all steps in instruction section.  Pt was performing this incorrectly and doing more of a STM massage of the arm.  She remembered steps quickly and was given a new handout.    11/17/22 Eval only Education on  POC   PATIENT EDUCATION:  Education details: per today's note Person educated: Patient Education method: Explanation Education comprehension: verbalized understanding  HOME EXERCISE PROGRAM:   ASSESSMENT:  CLINICAL IMPRESSION: Pt did well with self MLD re-education.  She needing cueing for direction and stretch vs slide.  Will submit sleeve order today.   OBJECTIVE IMPAIRMENTS: decreased activity tolerance, decreased knowledge of use of DME, decreased ROM, and increased edema.   ACTIVITY LIMITATIONS: carrying and lifting  PARTICIPATION LIMITATIONS: community activity  PERSONAL FACTORS: Time since onset of injury/illness/exacerbation are also affecting patient's functional outcome.   REHAB POTENTIAL: Excellent  CLINICAL DECISION MAKING: Stable/uncomplicated  EVALUATION COMPLEXITY: Low  GOALS: Goals reviewed  with patient? Yes  SHORT TERM GOALS=LTGs: Target date: 12/24/22  Pt will be measured for and new compression ordered for lymphedema management Baseline: Goal status: INITIAL  2.  Pt will improve Lt UE ROM to no significant pull in the upper arm with overhead flexion Baseline:  Goal status: INITIAL  3.  Pt will be ind with self MLD for the LT UE for lymphedema management Baseline:  Goal status: INITIAL  4. Pt will be ind with strength ABC or similar for weight lifting instruction   INITIAL   PLAN:  PT FREQUENCY: 1x/week  PT DURATION: other: 5 weeks   PLANNED INTERVENTIONS: Therapeutic exercises, Neuromuscular re-education, Patient/Family education, Self Care, Taping, Manual therapy, and Re-evaluation  PLAN FOR NEXT SESSION: Review Lt UE self MLD,  - mondi/secure vs dynamic?, STM/PROM working into [employer], PT 11/30/2022, 11:50 AM

## 2022-12-07 ENCOUNTER — Ambulatory Visit: Payer: PPO | Admitting: Rehabilitation

## 2022-12-07 DIAGNOSIS — C50412 Malignant neoplasm of upper-outer quadrant of left female breast: Secondary | ICD-10-CM

## 2022-12-07 DIAGNOSIS — I890 Lymphedema, not elsewhere classified: Secondary | ICD-10-CM

## 2022-12-07 DIAGNOSIS — M79602 Pain in left arm: Secondary | ICD-10-CM

## 2022-12-07 NOTE — Therapy (Signed)
OUTPATIENT PHYSICAL THERAPY  UPPER EXTREMITY ONCOLOGY TREATMENT  Patient Name: Madison Hopkins MRN: [id number] DOB:October 03, 1955, 67 y.o., female Today's Date: 12/07/2022  END OF SESSION:  PT End of Session - 12/07/22 1149     Visit Number 3    Number of Visits 5    Date for PT Re-Evaluation 12/22/22    PT Start Time 1100    PT Stop Time 1148    PT Time Calculation (min) 48 min    Activity Tolerance Patient tolerated treatment well    Behavior During Therapy [facility] for tasks assessed/performed              Past Medical History:  Diagnosis Date   Abnormal Pap smear of vagina 07/28/2016   LGSIL; colpo 07/2016 atrophic squamous cells; colpo 07/2017 - atypia   Allergy    Arthritis    Breast cancer (HCC) 2018   Metastatic Left breast   Elevated hemoglobin A1c 07/28/2016   level - 6.1   Endometriosis    GERD (gastroesophageal reflux disease)    HSV-2 infection    Iron deficiency anemia    Low vitamin D level 07/28/2016   level 24.7   Personal history of chemotherapy    finished 10/19   Personal history of radiation therapy    finished 03/2018   Thyroid cancer (HCC)    Thyroid neoplasm    Past Surgical History:  Procedure Laterality Date   ABDOMINAL HYSTERECTOMY     BREAST BIOPSY Left 08/09/2017   BREAST LUMPECTOMY Left 01/22/2018   BREAST LUMPECTOMY WITH RADIOACTIVE SEED AND AXILLARY LYMPH NODE DISSECTION Left 01/22/2018   Procedure: LEFT BREAST LUMPECTOMY WITH RADIOACTIVE SEED AND COMPLETE LEFT AXILLARY LYMPH NODE DISSECTION;  Surgeon: Claud Kelp, MD;  Location: [facility];  Service: General;  Laterality: Left;   CESAREAN SECTION     COLON SURGERY     COLOSTOMY     COLOSTOMY REVERSAL     FOOT SURGERY Right    done spur   OOPHORECTOMY     PORT-A-CATH REMOVAL Right 08/28/2018   Procedure: REMOVAL PORT-A-CATH;  Surgeon: Claud Kelp, MD;  Location: [facility];  Service: General;  Laterality: Right;   PORTACATH PLACEMENT N/A  09/06/2017   Procedure: INSERTION PORT-A-CATH;  Surgeon: Ovidio Kin, MD;  Location: [facility] OR;  Service: General;  Laterality: N/A;   SMALL INTESTINE SURGERY     SPINE SURGERY     Injections   THYROIDECTOMY N/A 09/20/2019   Procedure: TOTAL THYROIDECTOMY;  Surgeon: Darnell Level, MD;  Location: [facility];  Service: General;  Laterality: N/A;   TUBAL LIGATION  1995   Patient Active Problem List   Diagnosis Date Noted   H/O papillary adenocarcinoma of thyroid 11/17/2022   Acute non-recurrent frontal sinusitis 10/14/2022   Atrophic vaginitis 03/19/2022   History of thyroid cancer 09/15/2020   Papillary thyroid carcinoma (HCC) 04/07/2020   Post-operative hypothyroidism 10/02/2019   S/P total thyroidectomy 10/02/2019   Neoplasm of uncertain behavior of thyroid gland 09/16/2019   Multiple thyroid nodules 09/16/2019   COVID-19 virus infection 02/20/2019   Environmental allergies 01/12/2018   Malignant tumor of breast (HCC) 01/12/2018   Malignant neoplastic disease (HCC) 01/12/2018   Port-A-Cath in place 11/09/2017   Malignant neoplasm of upper-outer quadrant of left breast in female, estrogen receptor positive (HCC) 08/10/2017    PCP: Jarold Motto, PA  REFERRING PROVIDER: Lillard Anes NP  REFERRING DIAG:  Diagnosis  C50.412,Z17.0 (ICD-10-CM) - Malignant neoplasm of upper-outer quadrant of left breast  in female, estrogen receptor positive (HCC)    THERAPY DIAG:  Malignant neoplasm of upper-outer quadrant of left breast in female, estrogen receptor positive (HCC)  Lymphedema, not elsewhere classified  Pain in left arm  ONSET DATE: 2020  Rationale for Evaluation and Treatment: Rehabilitation  SUBJECTIVE:                                                                                                                                                                                           SUBJECTIVE STATEMENT: Nothing new. I am trying my new massage.    EVAL: When I lay on  that left side it is hurting.  I haven't used that machine.  The pain is under the arm.  The breast is a little tender and has been since surgery.  I don't think the arm is swelling any worse.  I have been trying to do my massage but I don't think I'm doing it right.  I will need a new sleeve.    PERTINENT HISTORY: left breast cancer, completed neoadjuvant chemo, Left lumpectomy on 01/22/2018 with a total of 11 axillary lymph nodes removed (1+), adjuvant radiation completed 04/20/18, started anastrozole 05/30/2018, status post total thyroidectomy 09/20/2019 for papillary thyroid carcinoma.   PAIN:  Are you having pain? Yes - not really sitting here at rest  NPRS scale: 3-4/10 laying on that side Pain location: left arm Pain orientation: Left  PAIN TYPE: dull, numb Pain description: intermittent  Aggravating factors: lay on that side   Relieving factors: I got a new mattress that helped,   PRECAUTIONS: LT UE lymphedema   RED FLAGS: None   WEIGHT BEARING RESTRICTIONS: No  FALLS:  Has patient fallen in last 6 months? No  OCCUPATION: retired  LEISURE: I have been going to the gym and doing my weights, but I'm not doing it much anymore.   HAND DOMINANCE: right   PRIOR LEVEL OF FUNCTION: Independent  PATIENT GOALS: get back on track   OBJECTIVE:  COGNITION: Overall cognitive status: Within functional limits for tasks assessed   PALPATION: Non pitting  OBSERVATIONS / OTHER ASSESSMENTS: Lt UE slightly larger at the elbow but not hand or lower arm  POSTURE: WNL  UPPER EXTREMITY AROM/PROM:  A/PROM LEFT   eval  Shoulder extension   Shoulder flexion 140 - pull back of the upper arm  Shoulder abduction 160 - tight upper arm   Shoulder internal rotation Behind the back WNL  Shoulder external rotation Behind the head WNL    (Blank rows = not tested)  LYMPHEDEMA ASSESSMENTS:   LANDMARK RIGHT  eval  At axilla  15 cm proximal to olecranon process 36.2  10 cm proximal to  olecranon process 33  Olecranon process 28.7  15 cm proximal to ulnar styloid process 26.2  10 cm proximal to ulnar styloid process 21.9  Just proximal to ulnar styloid process 17.5  Across hand at thumb web space 20.7  At base of 2nd digit 7.0  (Blank rows = not tested)   LANDMARK LEFT  eval  At axilla    15 cm proximal to olecranon process 36  10 cm proximal to olecranon process 35.5  Olecranon process 31.7  15 cm proximal to ulnar styloid process 26.6  10 cm proximal to ulnar styloid process 22.1  Just proximal to ulnar styloid process 17.7  Across hand at thumb web space 21.4  At base of 2nd digit 7.1  (Blank rows = not tested) 9 length    difference.    L-DEX LYMPHEDEMA SCREENING: The patient was assessed using the L-Dex machine today to produce a lymphedema index baseline score. The patient will be reassessed on a regular basis (typically every 3 months) to obtain new L-Dex scores. If the score is > 6.5 points away from his/her baseline score indicating onset of subclinical lymphedema, it will be recommended to wear a compression garment for 4 weeks, 12 hours per day and then be reassessed. If the score continues to be > 6.5 points from baseline at reassessment, we will initiate lymphedema treatment. Assessing in this manner has a 95% rate of preventing clinically significant lymphedema.  LLIS: 45%  TODAY'S TREATMENT:                                                                                                                                          DATE:  12/07/22 Pulleys into flexion and abduction x each with initial cueing Wall ball rolls into flexion and abduction x 5 each.   Shoulder circles backwards and rhomboid/deltoid stretch x 2 bil Short neck, superficial and deep abdominals, bilateral axillary nodes, creating interaxillary anastamosis, Lt inguinal nodes and creating Lt axilloinguinal anastamosis, then Lt UE from proximal to distal  including cubital nodes focusing deep techniques as needed, then in sidelying for posterior interaxillary and lateral trunk work, then back to supine for all steps in reverse.    11/30/22 Measured pt for sleeve. She would like to order a Juzo dynamic size IV Max long in Rose tie dye and a black. With no hand piece.  Will submit to [employer] platform today as able.   Self MLD education in seated: per all steps in instruction section.  Pt was performing this incorrectly and doing more of a STM massage of the arm.  She remembered steps quickly and was given a new handout.    11/17/22 Eval only Education on POC   PATIENT EDUCATION:  Education details: per today's note Person educated: Patient Education method: Explanation  Education comprehension: verbalized understanding  HOME EXERCISE PROGRAM: Self MLD  ASSESSMENT:  CLINICAL IMPRESSION:  Pt is doing well.  Continued POC with treatment tolerated well.   OBJECTIVE IMPAIRMENTS: decreased activity tolerance, decreased knowledge of use of DME, decreased ROM, and increased edema.   ACTIVITY LIMITATIONS: carrying and lifting  PARTICIPATION LIMITATIONS: community activity  PERSONAL FACTORS: Time since onset of injury/illness/exacerbation are also affecting patient's functional outcome.   REHAB POTENTIAL: Excellent  CLINICAL DECISION MAKING: Stable/uncomplicated  EVALUATION COMPLEXITY: Low  GOALS: Goals reviewed with patient? Yes  SHORT TERM GOALS=LTGs: Target date: 12/24/22  Pt will be measured for and new compression ordered for lymphedema management Baseline: Goal status: MET  2.  Pt will improve Lt UE ROM to no significant pull in the upper arm with overhead flexion Baseline:  Goal status: INITIAL  3.  Pt will be ind with self MLD for the LT UE for lymphedema management Baseline:  Goal status: INITIAL  4. Pt will be ind with strength ABC or similar for weight lifting instruction   INITIAL   PLAN:  PT FREQUENCY:  1x/week  PT DURATION: other: 5 weeks   PLANNED INTERVENTIONS: Therapeutic exercises, Neuromuscular re-education, Patient/Family education, Self Care, Taping, Manual therapy, and Re-evaluation  PLAN FOR NEXT SESSION: Review Lt UE self MLD,  STM/PROM working into strength ABC 11/30/22: submitted sleeve to [employer], [place] R, PT 12/07/2022, 11:49 AM

## 2022-12-14 ENCOUNTER — Ambulatory Visit: Payer: PPO | Admitting: Rehabilitation

## 2022-12-14 ENCOUNTER — Encounter: Payer: Self-pay | Admitting: Rehabilitation

## 2022-12-14 DIAGNOSIS — M79602 Pain in left arm: Secondary | ICD-10-CM

## 2022-12-14 DIAGNOSIS — C50412 Malignant neoplasm of upper-outer quadrant of left female breast: Secondary | ICD-10-CM | POA: Diagnosis not present

## 2022-12-14 DIAGNOSIS — I890 Lymphedema, not elsewhere classified: Secondary | ICD-10-CM

## 2022-12-14 NOTE — Therapy (Signed)
OUTPATIENT PHYSICAL THERAPY  UPPER EXTREMITY ONCOLOGY TREATMENT  Patient Name: Madison Hopkins MRN: [id number] DOB:1955-04-25, 67 y.o., female Today's Date: 12/14/2022  END OF SESSION:  PT End of Session - 12/14/22 1053     Visit Number 4    Number of Visits 5    Date for PT Re-Evaluation 12/22/22    PT Start Time 1100    PT Stop Time 1148    PT Time Calculation (min) 48 min    Activity Tolerance Patient tolerated treatment well    Behavior During Therapy [facility] for tasks assessed/performed               Past Medical History:  Diagnosis Date   Abnormal Pap smear of vagina 07/28/2016   LGSIL; colpo 07/2016 atrophic squamous cells; colpo 07/2017 - atypia   Allergy    Arthritis    Breast cancer (HCC) 2018   Metastatic Left breast   Elevated hemoglobin A1c 07/28/2016   level - 6.1   Endometriosis    GERD (gastroesophageal reflux disease)    HSV-2 infection    Iron deficiency anemia    Low vitamin D level 07/28/2016   level 24.7   Personal history of chemotherapy    finished 10/19   Personal history of radiation therapy    finished 03/2018   Thyroid cancer (HCC)    Thyroid neoplasm    Past Surgical History:  Procedure Laterality Date   ABDOMINAL HYSTERECTOMY     BREAST BIOPSY Left 08/09/2017   BREAST LUMPECTOMY Left 01/22/2018   BREAST LUMPECTOMY WITH RADIOACTIVE SEED AND AXILLARY LYMPH NODE DISSECTION Left 01/22/2018   Procedure: LEFT BREAST LUMPECTOMY WITH RADIOACTIVE SEED AND COMPLETE LEFT AXILLARY LYMPH NODE DISSECTION;  Surgeon: Claud Kelp, MD;  Location: [facility];  Service: General;  Laterality: Left;   CESAREAN SECTION     COLON SURGERY     COLOSTOMY     COLOSTOMY REVERSAL     FOOT SURGERY Right    done spur   OOPHORECTOMY     PORT-A-CATH REMOVAL Right 08/28/2018   Procedure: REMOVAL PORT-A-CATH;  Surgeon: Claud Kelp, MD;  Location: [facility];  Service: General;  Laterality: Right;   PORTACATH PLACEMENT N/A  09/06/2017   Procedure: INSERTION PORT-A-CATH;  Surgeon: Ovidio Kin, MD;  Location: [facility] OR;  Service: General;  Laterality: N/A;   SMALL INTESTINE SURGERY     SPINE SURGERY     Injections   THYROIDECTOMY N/A 09/20/2019   Procedure: TOTAL THYROIDECTOMY;  Surgeon: Darnell Level, MD;  Location: [facility];  Service: General;  Laterality: N/A;   TUBAL LIGATION  1995   Patient Active Problem List   Diagnosis Date Noted   H/O papillary adenocarcinoma of thyroid 11/17/2022   Acute non-recurrent frontal sinusitis 10/14/2022   Atrophic vaginitis 03/19/2022   History of thyroid cancer 09/15/2020   Papillary thyroid carcinoma (HCC) 04/07/2020   Post-operative hypothyroidism 10/02/2019   S/P total thyroidectomy 10/02/2019   Neoplasm of uncertain behavior of thyroid gland 09/16/2019   Multiple thyroid nodules 09/16/2019   COVID-19 virus infection 02/20/2019   Environmental allergies 01/12/2018   Malignant tumor of breast (HCC) 01/12/2018   Malignant neoplastic disease (HCC) 01/12/2018   Port-A-Cath in place 11/09/2017   Malignant neoplasm of upper-outer quadrant of left breast in female, estrogen receptor positive (HCC) 08/10/2017    PCP: Jarold Motto, PA  REFERRING PROVIDER: Lillard Anes NP  REFERRING DIAG:  Diagnosis  C50.412,Z17.0 (ICD-10-CM) - Malignant neoplasm of upper-outer quadrant of left  breast in female, estrogen receptor positive (HCC)    THERAPY DIAG:  Malignant neoplasm of upper-outer quadrant of left breast in female, estrogen receptor positive (HCC)  Lymphedema, not elsewhere classified  Pain in left arm  ONSET DATE: 2020  Rationale for Evaluation and Treatment: Rehabilitation  SUBJECTIVE:                                                                                                                                                                                           SUBJECTIVE STATEMENT: Still armpit tender   EVAL: When I lay on that left side it is  hurting.  I haven't used that machine.  The pain is under the arm.  The breast is a little tender and has been since surgery.  I don't think the arm is swelling any worse.  I have been trying to do my massage but I don't think I'm doing it right.  I will need a new sleeve.    PERTINENT HISTORY: left breast cancer, completed neoadjuvant chemo, Left lumpectomy on 01/22/2018 with a total of 11 axillary lymph nodes removed (1+), adjuvant radiation completed 04/20/18, started anastrozole 05/30/2018, status post total thyroidectomy 09/20/2019 for papillary thyroid carcinoma.   PAIN:  Are you having pain? Yes - not really sitting here at rest  NPRS scale: 3-4/10 laying on that side Pain location: left arm Pain orientation: Left  PAIN TYPE: dull, numb Pain description: intermittent  Aggravating factors: lay on that side   Relieving factors: I got a new mattress that helped,   PRECAUTIONS: LT UE lymphedema   RED FLAGS: None   WEIGHT BEARING RESTRICTIONS: No  FALLS:  Has patient fallen in last 6 months? No  OCCUPATION: retired  LEISURE: I have been going to the gym and doing my weights, but I'm not doing it much anymore.   HAND DOMINANCE: right   PRIOR LEVEL OF FUNCTION: Independent  PATIENT GOALS: get back on track   OBJECTIVE:  COGNITION: Overall cognitive status: Within functional limits for tasks assessed   PALPATION: Non pitting  OBSERVATIONS / OTHER ASSESSMENTS: Lt UE slightly larger at the elbow but not hand or lower arm  POSTURE: WNL  UPPER EXTREMITY AROM/PROM:  A/PROM LEFT   eval 10/16  Shoulder extension    Shoulder flexion 140 - pull back of the upper arm 155 - pull   Shoulder abduction 160 - tight upper arm  155 - pull  Shoulder internal rotation Behind the back WNL   Shoulder external rotation Behind the head WNL     (Blank rows = not tested)  LYMPHEDEMA ASSESSMENTS:   LANDMARK RIGHT  eval  At axilla    15 cm proximal to olecranon process 36.2  10 cm  proximal to olecranon process 33  Olecranon process 28.7  15 cm proximal to ulnar styloid process 26.2  10 cm proximal to ulnar styloid process 21.9  Just proximal to ulnar styloid process 17.5  Across hand at thumb web space 20.7  At base of 2nd digit 7.0  (Blank rows = not tested)   LANDMARK LEFT  eval  At axilla    15 cm proximal to olecranon process 36  10 cm proximal to olecranon process 35.5  Olecranon process 31.7  15 cm proximal to ulnar styloid process 26.6  10 cm proximal to ulnar styloid process 22.1  Just proximal to ulnar styloid process 17.7  Across hand at thumb web space 21.4  At base of 2nd digit 7.1  (Blank rows = not tested) 9 length    difference.    L-DEX LYMPHEDEMA SCREENING: The patient was assessed using the L-Dex machine today to produce a lymphedema index baseline score. The patient will be reassessed on a regular basis (typically every 3 months) to obtain new L-Dex scores. If the score is > 6.5 points away from his/her baseline score indicating onset of subclinical lymphedema, it will be recommended to wear a compression garment for 4 weeks, 12 hours per day and then be reassessed. If the score continues to be > 6.5 points from baseline at reassessment, we will initiate lymphedema treatment. Assessing in this manner has a 95% rate of preventing clinically significant lymphedema.  LLIS: 45%  TODAY'S TREATMENT:                                                                                                                                          DATE:  12/14/22 Pulleys into flexion and abduction x each  Wall ball rolls into flexion and abduction x 5 each.   Rhomboid/deltoid stretch x 2 bil x 15"  Tricep stretch bil x 15"  Short neck, superficial and deep abdominals, bilateral axillary nodes, creating interaxillary anastamosis, Lt inguinal nodes and creating Lt axilloinguinal anastamosis, then Lt UE from proximal to distal  including cubital nodes focusing deep techniques as needed, then back to supine for all steps in reverse.  STM and MFR to Lt axilla with arm overhead propped on a pillow.    12/07/22 Pulleys into flexion and abduction x each with initial cueing Wall ball rolls into flexion and abduction x 5 each.   Shoulder circles backwards and rhomboid/deltoid stretch x 2 bil Short neck, superficial and deep abdominals, bilateral axillary nodes, creating interaxillary anastamosis, Lt inguinal nodes and creating Lt axilloinguinal anastamosis, then Lt UE from proximal to distal including cubital nodes focusing deep techniques as needed, then in sidelying for posterior interaxillary and lateral trunk work, then back to supine for all steps in reverse.   11/30/22  Measured pt for sleeve. She would like to order a Juzo dynamic size IV Max long in Rose tie dye and a black. With no hand piece.  Will submit to [employer] platform today as able.   Self MLD education in seated: per all steps in instruction section.  Pt was performing this incorrectly and doing more of a STM massage of the arm.  She remembered steps quickly and was given a new handout.    11/17/22 Eval only Education on POC   PATIENT EDUCATION:  Education details: per today's note Person educated: Patient Education method: Explanation Education comprehension: verbalized understanding  HOME EXERCISE PROGRAM: Self MLD  ASSESSMENT:  CLINICAL IMPRESSION:  Pt is doing well.  Continued POC with treatment tolerated well. Added more axillary STM today due to main complaint now being tightness in the axilla reaching overhead.    OBJECTIVE IMPAIRMENTS: decreased activity tolerance, decreased knowledge of use of DME, decreased ROM, and increased edema.   ACTIVITY LIMITATIONS: carrying and lifting  PARTICIPATION LIMITATIONS: community activity  PERSONAL FACTORS: Time since onset of injury/illness/exacerbation are also affecting patient's functional  outcome.   REHAB POTENTIAL: Excellent  CLINICAL DECISION MAKING: Stable/uncomplicated  EVALUATION COMPLEXITY: Low  GOALS: Goals reviewed with patient? Yes  SHORT TERM GOALS=LTGs: Target date: 12/24/22  Pt will be measured for and new compression ordered for lymphedema management Baseline: Goal status: MET  2.  Pt will improve Lt UE ROM to no significant pull in the upper arm with overhead flexion Baseline:  Goal status: INITIAL  3.  Pt will be ind with self MLD for the LT UE for lymphedema management Baseline:  Goal status: INITIAL  4. Pt will be ind with strength ABC or similar for weight lifting instruction   INITIAL   PLAN:  PT FREQUENCY: 1x/week  PT DURATION: other: 5 weeks   PLANNED INTERVENTIONS: Therapeutic exercises, Neuromuscular re-education, Patient/Family education, Self Care, Taping, Manual therapy, and Re-evaluation  PLAN FOR NEXT SESSION: Review Lt UE self MLD,  STM/PROM working into strength ABC 11/30/22: submitted sleeve to [employer], [place] R, PT 12/14/2022, 11:52 AM

## 2022-12-15 ENCOUNTER — Encounter: Payer: Self-pay | Admitting: Hematology and Oncology

## 2022-12-21 ENCOUNTER — Encounter: Payer: PPO | Admitting: Rehabilitation

## 2022-12-22 ENCOUNTER — Inpatient Hospital Stay: Payer: PPO | Attending: Physician Assistant | Admitting: Hematology and Oncology

## 2022-12-22 VITALS — BP 142/81 | HR 72 | Temp 97.3°F | Resp 16 | Wt 197.4 lb

## 2022-12-22 DIAGNOSIS — Z170 Estrogen receptor positive status [ER+]: Secondary | ICD-10-CM | POA: Insufficient documentation

## 2022-12-22 DIAGNOSIS — Z79811 Long term (current) use of aromatase inhibitors: Secondary | ICD-10-CM | POA: Insufficient documentation

## 2022-12-22 DIAGNOSIS — C773 Secondary and unspecified malignant neoplasm of axilla and upper limb lymph nodes: Secondary | ICD-10-CM | POA: Insufficient documentation

## 2022-12-22 DIAGNOSIS — C50412 Malignant neoplasm of upper-outer quadrant of left female breast: Secondary | ICD-10-CM | POA: Insufficient documentation

## 2022-12-22 NOTE — Progress Notes (Signed)
[facility]  [phone] [phone]) [phone]    ID: Madison Hopkins DOB: [birth year]  MR#: [id number]  CSN#:[id number]  Patient Care Team: Jarold Motto, PA as PCP - General (Physician Assistant) [name], Valentino Hue, MD (Inactive) as Consulting Physician (Oncology) Ardell Isaacs, Forrestine Him, MD as Consulting Physician (Obstetrics and Gynecology) Bensimhon, Bevelyn Buckles, MD as Consulting Physician (Cardiology) Claria Dice, MD as Attending Physician (Physical Medicine and Rehabilitation) [facility], Konrad Dolores, MD as Consulting Physician (Endocrinology) Almond Lint, MD as Consulting Physician (General Surgery) OTHER MD:   CHIEF COMPLAINT: HER-2 positive, weakly estrogen receptor positive breast cancer  CURRENT TREATMENT: Anastrozole  INTERVAL HISTORY:  Madison Hopkins returns today for follow-up of her HER-2 positive, weakly estrogen receptor positive breast cancer.  Madison Hopkins is here for follow-up.  Since her last visit, she continues to have breast sensitivity and some tenderness especially with mammograms.  She wondered if she can do ultrasound instead of mammograms.  Other than this, she has also noticed that her vagina is very dry and she is having some dyspareunia and wonders if she can take some supplements of medication to help improve vaginal dryness.  She is also happy that she will be done with anastrozole soon.  Rest of the pertinent 10 point ROS reviewed and negative   COVID 19 VACCINATION STATUS: Pfizer x3, most recently 01/2020; infection 01/2019 and 08/2020 (received molnupiravir)   HISTORY OF CURRENT ILLNESS: From the original intake note:  Madison Hopkins noted a mass in the left axilla sometime in January or February 2019.  She eventually brought her to her gynecologist's attention, and underwent bilateral diagnostic mammography with tomography and left breast ultrasonography at [facility] on 08/04/2017 showing: breast density category B. There  is a highly suspicious hypoechoic mass in the left breast at the 2 o'clock upper outer quadrant measuring 2.3 x 1.6 x 2.2 cm, located 2 cm from the nipple. Sonographically, there were 2 enlarged lymph nodes in the left axilla, the largest with cortical thickening measuring 2.5 cm. No evidence of malignancy was seen in the right breast.   Accordingly on 08/07/2017 she proceeded to biopsy of the left breast area and 1 of the lymph nodes in question. The pathology from this procedure showed ([id number]): Invasive ductal carcinoma, grade 3. Metastatic carcinoma was found in one left axillary lymph node. Prognostic indicators significant for: estrogen receptor, 30% positive with weak staining intensity and progesterone receptor, 0% negative. Proliferation marker Ki67 at 80%. HER2 amplified with ratios HER2/CEP17 signals 2.32 and average HER2 copies per cell 6.60  The patient's subsequent history is as detailed below.   PAST MEDICAL HISTORY: Past Medical History:  Diagnosis Date   Abnormal Pap smear of vagina 07/28/2016   LGSIL; colpo 07/2016 atrophic squamous cells; colpo 07/2017 - atypia   Allergy    Arthritis    Breast cancer (HCC) 2018   Metastatic Left breast   Elevated hemoglobin A1c 07/28/2016   level - 6.1   Endometriosis    GERD (gastroesophageal reflux disease)    HSV-2 infection    Iron deficiency anemia    Low vitamin D level 07/28/2016   level 24.7   Personal history of chemotherapy    finished 10/19   Personal history of radiation therapy    finished 03/2018   Thyroid cancer (HCC)    Thyroid neoplasm   GERD but no ulcers   PAST SURGICAL HISTORY: Past Surgical History:  Procedure Laterality Date   ABDOMINAL HYSTERECTOMY  BREAST BIOPSY Left 08/09/2017   BREAST LUMPECTOMY Left 01/22/2018   BREAST LUMPECTOMY WITH RADIOACTIVE SEED AND AXILLARY LYMPH NODE DISSECTION Left 01/22/2018   Procedure: LEFT BREAST LUMPECTOMY WITH RADIOACTIVE SEED AND COMPLETE LEFT AXILLARY LYMPH  NODE DISSECTION;  Surgeon: Claud Kelp, MD;  Location: [facility];  Service: General;  Laterality: Left;   CESAREAN SECTION     COLON SURGERY     COLOSTOMY     COLOSTOMY REVERSAL     FOOT SURGERY Right    done spur   OOPHORECTOMY     PORT-A-CATH REMOVAL Right 08/28/2018   Procedure: REMOVAL PORT-A-CATH;  Surgeon: Claud Kelp, MD;  Location: [facility];  Service: General;  Laterality: Right;   PORTACATH PLACEMENT N/A 09/06/2017   Procedure: INSERTION PORT-A-CATH;  Surgeon: Ovidio Kin, MD;  Location: [facility];  Service: General;  Laterality: N/A;   SMALL INTESTINE SURGERY     SPINE SURGERY     Injections   THYROIDECTOMY N/A 09/20/2019   Procedure: TOTAL THYROIDECTOMY;  Surgeon: Darnell Level, MD;  Location: [facility];  Service: General;  Laterality: N/A;   TUBAL LIGATION  1995  Hysterectomy with salpingo-oophorectomy, Tonsillectomy, Plantar Fascitis Right Foot Surgery    FAMILY HISTORY: Family History  Problem Relation Age of Onset   Mental illness Mother    Alcoholism Mother    Cirrhosis Mother    Alcohol abuse Mother    Cancer Father    Lung cancer Father    The patient' father died at age 76 due to lung cancer (heavy smoker). The patient's mother died due to liver cirrhosis (heavy drinker). The patient has 2 brothers and no sisters. There was a paternal 1st cousin with colon cancer diagnosed in the mid 40's, who also had cervical cancer. The mother of this 1st cousin (the patient's paternal aunt) had cancer (the patient's is unsure of what type). There was also a paternal uncle with prostate cancer diagnosed in the 23's. The patient denies a family history of breast or ovarian cancer.     GYNECOLOGIC HISTORY:  No LMP recorded (lmp unknown). Patient has had a hysterectomy. Menarche: 67 years old Age at first live birth: 67 years old She is GXP2.  The patient is status post total hysterectomy with bilateral salpingo-oophorectomy in 1995.  She  never used contraception. She notes that she had an estrogen shot one time but no other HRTs.    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  She describes herself single. At home is herself and no pets. Her son, Marshell Levan lives in [place], [place] where he works as a Loss adjuster, chartered. The patient's daughter Claris Gower lives in [place] in Clinical biochemist for [employer]. The patient has 5 grandchildren and 4 great grandchildren. She attends [employer].   ADVANCED DIRECTIVES: at the 08/16/2017 visit the patient was given the appropriate forms to complete on notarized at her discretion   HEALTH MAINTENANCE: Social History   Tobacco Use   Smoking status: Never   Smokeless tobacco: Never  Vaping Use   Vaping status: Never Used  Substance Use Topics   Alcohol use: Yes    Alcohol/week: 1.0 standard drink of alcohol    Types: 1 Glasses of wine per week    Comment: Social   Drug use: Not Currently    Types: Marijuana    Comment: everyday     Colonoscopy: 2009?  PAP: 10/2019, negative  Bone density:  10/24/2016, -0.7   Allergies  Allergen Reactions   Penicillins Nausea Only, Hives and Nausea And Vomiting    Has patient had a PCN reaction causing immediate rash, facial/tongue/throat swelling, SOB or lightheadedness with hypotension: No Has patient had a PCN reaction causing severe rash involving mucus membranes or skin necrosis: No Has patient had a PCN reaction that required hospitalization: No Has patient had a PCN reaction occurring within the last 10 years: Yes--nausea & headache ONLY If all of the above answers are "NO", then may proceed with Cephalosporin use.  Has patient had a PCN reaction causing immediate rash, facial/tongue/throat swelling, SOB or lightheadedness with hypotension: No Has patient had a PCN reaction causing severe rash involving mucus membranes or skin necrosis: No Has patient had a PCN reaction that required  hospitalization: No Has patient had a PCN reaction occurring within the last 10 years: Yes--nausea & headache ONLY If all of the above answers are "NO", then may proceed with Cephalosporin use. Has patient had a PCN reaction causing immediate rash, facial/tongue/throat swelling, SOB or lightheadedness with hypotension: No Has patient had a PCN reaction causing severe rash involving mucus membranes or skin necrosis: No Has patient had a PCN reaction that required hospitalization: No Has patient had a PCN reaction occurring within the last 10 years: Yes--nausea & headache ONLY If all of the above answers are "NO", then may proceed with Cephalosporin use.   Bee Venom    Tramadol Nausea Only    Current Outpatient Medications  Medication Sig Dispense Refill   acyclovir (ZOVIRAX) 400 MG tablet Take 1 tablet (400 mg total) by mouth 2 (two) times daily. 180 tablet 1   anastrozole (ARIMIDEX) 1 MG tablet Take 1 tablet (1 mg total) by mouth daily. 90 tablet 4   Artificial Tear Solution (OPTI-TEARS OP) Place 1 drop into both eyes 3 (three) times daily as needed (for dry/irritated eyes.).     Ascorbic Acid (VITAMIN C) 100 MG CHEW Chew 100 mg by mouth daily.      BORIC ACID EX Apply topically.      fluticasone (FLONASE) 50 MCG/ACT nasal spray SPRAY 2 SPRAYS INTO EACH NOSTRIL EVERY DAY 16 mL 2   ibuprofen (ADVIL) 800 MG tablet Take 800 mg by mouth 3 (three) times daily as needed.     ipratropium (ATROVENT) 0.03 % nasal spray Place 2 sprays into both nostrils every 12 (twelve) hours. 30 mL 12   levothyroxine (SYNTHROID) 100 MCG tablet Take 1 tablet (100 mcg total) by mouth daily. 90 tablet 3   lidocaine (XYLOCAINE) 5 % ointment Apply 1 Application topically 3 (three) times daily. Uses as needed. 1.25 g 1   Multiple Vitamins-Minerals (VITAMIN D3 COMPLETE PO) Take 1 tablet by mouth daily.      Probiotic Product (ALIGN PO) Take by mouth.      No current facility-administered medications for this visit.     OBJECTIVE: African-American woman who appears younger than stated age  Vitals:   12/22/22 1123  BP: (!) 142/81  Pulse: 72  Resp: 16  Temp: (!) 97.3 F (36.3 C)  SpO2: 99%     Body mass index is 30.01 kg/m.   Wt Readings from Last 3 Encounters:  12/22/22 197 lb 6.4 oz (89.5 kg)  11/17/22 198 lb (89.8 kg)  11/02/22 198 lb (89.8 kg)  ECOG FS:1 - Symptomatic but completely ambulatory  Sclerae unicteric, EOMs intact Neck: No cervical or regional adenopathy Breast: Left breast status postlumpectomy and radiation.  No palpable  masses in bilateral breast.  No regional adenopathy.  LAB RESULTS:  CMP     Component Value Date/Time   NA 138 10/19/2022 1335   K 3.4 (L) 10/19/2022 1335   CL 103 10/19/2022 1335   CO2 27 10/19/2022 1335   GLUCOSE 91 10/19/2022 1335   BUN 12 10/19/2022 1335   CREATININE 0.89 10/19/2022 1335   CREATININE 0.94 12/22/2021 1133   CREATININE 0.90 01/14/2020 0852   CALCIUM 10.2 10/19/2022 1335   PROT 7.7 10/19/2022 1335   ALBUMIN 4.5 10/19/2022 1335   AST 16 10/19/2022 1335   AST 13 (L) 12/22/2021 1133   ALT 21 10/19/2022 1335   ALT 15 12/22/2021 1133   ALKPHOS 116 10/19/2022 1335   BILITOT 0.6 10/19/2022 1335   BILITOT 0.6 12/22/2021 1133   GFRNONAA >60 10/06/2022 1308   GFRNONAA >60 12/22/2021 1133   GFRAA >60 09/21/2019 0627   GFRAA >60 10/25/2017 1055    No results found for: "TOTALPROTELP", "ALBUMINELP", "A1GS", "A2GS", "BETS", "BETA2SER", "GAMS", "MSPIKE", "SPEI"  No results found for: "KPAFRELGTCHN", "LAMBDASER", "KAPLAMBRATIO"  Lab Results  Component Value Date   WBC 9.3 10/19/2022   NEUTROABS 4.8 10/12/2022   HGB 12.2 10/19/2022   HCT 37.8 10/19/2022   MCV 84.2 10/19/2022   PLT 331.0 10/19/2022   No results found for: "LABCA2"  No components found for: "WUJWJX914"  No results for input(s): "INR" in the last 168 hours.  No results found for: "LABCA2"  No results found for: "NWG956"  No results found for:  "CAN125"  No results found for: "CAN153"  No results found for: "CA2729"  No components found for: "HGQUANT"  No results found for: "CEA1", "CEA" / No results found for: "CEA1", "CEA"   No results found for: "AFPTUMOR"  No results found for: "CHROMOGRNA"  No results found for: "TOTALPROTELP", "ALBUMINELP", "A1GS", "A2GS", "BETS", "BETA2SER", "GAMS", "MSPIKE", "SPEI" (this displays SPEP labs)  No results found for: "KPAFRELGTCHN", "LAMBDASER", "KAPLAMBRATIO" (kappa/lambda light chains)  No results found for: "HGBA", "HGBA2QUANT", "HGBFQUANT", "HGBSQUAN" (Hemoglobinopathy evaluation)   No results found for: "LDH"  Lab Results  Component Value Date   IRON 67 10/19/2022   TIBC 399.0 10/19/2022   IRONPCTSAT 16.8 (L) 10/19/2022   (Iron and TIBC)  Lab Results  Component Value Date   FERRITIN 65.6 10/19/2022    Urinalysis    Component Value Date/Time   COLORURINE YELLOW 10/19/2022 1335   APPEARANCEUR CLEAR 10/19/2022 1335   LABSPEC 1.010 10/19/2022 1335   PHURINE 7.0 10/19/2022 1335   GLUCOSEU NEGATIVE 10/19/2022 1335   HGBUR NEGATIVE 10/19/2022 1335   BILIRUBINUR NEGATIVE 10/19/2022 1335   BILIRUBINUR neg 02/01/2021 1458   KETONESUR NEGATIVE 10/19/2022 1335   PROTEINUR NEGATIVE 11/18/2021 1509   UROBILINOGEN 0.2 10/19/2022 1335   NITRITE NEGATIVE 10/19/2022 1335   LEUKOCYTESUR NEGATIVE 10/19/2022 1335    STUDIES: US THYROID  Result Date: 11/29/2022 CLINICAL DATA:  Other. History of total thyroidectomy and radioactive iodine ablation for papillary thyroid cancer. EXAM: THYROID ULTRASOUND TECHNIQUE: Ultrasound examination of the thyroid gland and adjacent soft tissues was performed. COMPARISON:  None Available. FINDINGS: The thyroid gland is surgically absent. Sonographic evaluation of the resection bed demonstrates no evidence of residual or recurrent thyroid tissue. No nodularity or adenopathy. IMPRESSION: Surgical changes of total thyroidectomy without evidence of  residual recurrent disease. Electronically Signed   By: Malachy Moan M.D.   On: 11/29/2022 06:45     ELIGIBLE FOR AVAILABLE RESEARCH PROTOCOL: No   ASSESSMENT:   67 y.o.  [place], [place] woman status post left breast upper outer quadrant and left axillary lymph node biopsy 08/07/2017, both positive for a T2 N1, stage IIB invasive ductal carcinoma, grade 3, estrogen receptor weakly positive, progesterone receptor negative, but HER-2 amplified, with an MIB-1 of 80%  (a) staging chest CT and bone scan 08/30/2017 showed no evidence of metastatic disease  (1) neoadjuvant chemotherapy will consist of carboplatin, docetaxel, trastuzumab, and Pertuzumab given every 21 days x 6 starting 09/07/2017, perjeta omitted with cycle 2 and 3 due to diarrhea  (a) Gemcitabine substituted for Docetaxel beginning with cycle 5 and 6 for concerns of neuropathy  (2) trastuzumab continued to complete 6 months, last dose 04/06/2018  (a) echocardiogram 08/28/2017 showed an ejection fraction in the 60-65% range.  (b) echocardiogram on 11/29/2017 showed an EF of 55-60%  (c) echocardiogram 03/07/2018 showed an ejection fraction in the 55-60% range  (3) Left lumpectomy on 01/22/2018 shows a ypT1b pN1a, grade 3 residual invasive ductal carcinoma, agnostic panel HER-2 negative, ER 40% weakly positive, PR negative.   (a) foundation one testing requested on 02/02/2018  (b) total of 11 axillary lymph nodes removed (1+)  (4) adjuvant radiation completed 04/20/2018  (5) started anastrozole 05/30/2018  (a) bone density 10/25/2016 normal with a T score of -0.7  (b) bone density 11/28/2019 shows a T score of -1.5  (6) status post total thyroidectomy 09/20/2019 for papillary thyroid carcinoma, pT1b pNX, with negative margins   PLAN:  Continue anastrozole until April 2025, we have discussed previously and today that she can finish 5 years of antiestrogen therapy followed by surveillance.  With regards to the breast  tenderness and concerned about doing mammograms, have offered trying for an MRI breast for screening if she cannot do mammograms.  She is not quite inclined to do MRIs, she says the machines are hard for her.  With regards to ultrasound of the breast, I do not believe this is an ideal way to screen for breast cancer without an accompanying mammogram hence she has agreed to continue the 3D mammograms, this has been ordered for December. With regards to vaginal dryness, we have discussed about several treatments including Replens, coconut oil suppositories, pelvic rehabilitation and rarely vaginal estrogen.  We have discussed that there is a small amount of vaginal estrogen systematically absorbed.  She wants to try a wild yam cream.  This contained wild yam , Vitex and some other plant based derivatives.  Okay to try this plant based cream.  She will report if her vaginal dryness continues to be bothersome. With regards to the thyroid cancer, she has followed up with endocrinology and she got a good report.  She will return to clinic in 6 months when she will be finishing her antiestrogen therapy and after that we will follow her annually.  Total time : 30 min  *Total Encounter Time as defined by the Centers for Medicare and Medicaid Services includes, in addition to the face-to-face time of a patient visit (documented in the note above) non-face-to-face time: obtaining and reviewing outside history, ordering and reviewing medications, tests or procedures, care coordination (communications with other health care professionals or caregivers) and documentation in the medical record.

## 2022-12-23 ENCOUNTER — Encounter: Payer: Self-pay | Admitting: Obstetrics and Gynecology

## 2022-12-23 ENCOUNTER — Ambulatory Visit (INDEPENDENT_AMBULATORY_CARE_PROVIDER_SITE_OTHER): Payer: PPO | Admitting: Obstetrics and Gynecology

## 2022-12-23 VITALS — BP 128/80 | HR 64 | Ht 68.0 in | Wt 196.0 lb

## 2022-12-23 DIAGNOSIS — Z113 Encounter for screening for infections with a predominantly sexual mode of transmission: Secondary | ICD-10-CM | POA: Diagnosis not present

## 2022-12-23 DIAGNOSIS — K582 Mixed irritable bowel syndrome: Secondary | ICD-10-CM | POA: Insufficient documentation

## 2022-12-23 DIAGNOSIS — R151 Fecal smearing: Secondary | ICD-10-CM | POA: Diagnosis not present

## 2022-12-23 DIAGNOSIS — N958 Other specified menopausal and perimenopausal disorders: Secondary | ICD-10-CM | POA: Diagnosis not present

## 2022-12-23 DIAGNOSIS — N898 Other specified noninflammatory disorders of vagina: Secondary | ICD-10-CM | POA: Diagnosis not present

## 2022-12-23 LAB — WET PREP FOR TRICH, YEAST, CLUE

## 2022-12-23 NOTE — Assessment & Plan Note (Signed)
Recommend use of fiber for bulking. Patient also use anti-diarrheals PRN.

## 2022-12-23 NOTE — Assessment & Plan Note (Addendum)
Negative wet mount, will send PCR testing Considering applying coconut oil to the vulva after showers. Also recommend using aquaphor as a barrier cream given fecal smearing with IBS.

## 2022-12-23 NOTE — Progress Notes (Signed)
66 y.o. G29P0002 female with hx of breast cancer on anastrozole, GSM (vit E), s/p hysterctomy here for vaginitis after recent intercourse.  Pt has external itching in the vaginal and perineal area. She reported using "Honey Pot" PH balancer insert. She vaginal/perineal irritation this often. Pt reported no issues with urination. No odor or discharge   Sexually active: yes, not often but had IC this past weekend with on and off partner. Described it to be painful. Is not using vitamin E suppositories or vaginal moisturizers Has frequent stools which she believes is causing some irritation   OB History  Gravida Para Term Preterm AB Living  2 2 0 0 0 2  SAB IAB Ectopic Multiple Live Births  0 0 0 0 0    # Outcome Date GA Lbr Len/2nd Weight Sex Type Anes PTL Lv  2 Para     F CS-Unspec     1 Para     M Vag-Spont       Past Medical History:  Diagnosis Date   Abnormal Pap smear of vagina 07/28/2016   LGSIL; colpo 07/2016 atrophic squamous cells; colpo 07/2017 - atypia   Allergy    Arthritis    Breast cancer (HCC) 2018   Metastatic Left breast   Elevated hemoglobin A1c 07/28/2016   level - 6.1   Endometriosis    GERD (gastroesophageal reflux disease)    HSV-2 infection    Iron deficiency anemia    Low vitamin D level 07/28/2016   level 24.7   Personal history of chemotherapy    finished 10/19   Personal history of radiation therapy    finished 03/2018   Thyroid cancer (HCC)    Thyroid neoplasm     Past Surgical History:  Procedure Laterality Date   ABDOMINAL HYSTERECTOMY     BREAST BIOPSY Left 08/09/2017   BREAST LUMPECTOMY Left 01/22/2018   BREAST LUMPECTOMY WITH RADIOACTIVE SEED AND AXILLARY LYMPH NODE DISSECTION Left 01/22/2018   Procedure: LEFT BREAST LUMPECTOMY WITH RADIOACTIVE SEED AND COMPLETE LEFT AXILLARY LYMPH NODE DISSECTION;  Surgeon: Claud Kelp, MD;  Location: [facility];  Service: General;  Laterality: Left;   CESAREAN SECTION     COLON  SURGERY     COLOSTOMY     COLOSTOMY REVERSAL     FOOT SURGERY Right    done spur   OOPHORECTOMY     PORT-A-CATH REMOVAL Right 08/28/2018   Procedure: REMOVAL PORT-A-CATH;  Surgeon: Claud Kelp, MD;  Location: [facility];  Service: General;  Laterality: Right;   PORTACATH PLACEMENT N/A 09/06/2017   Procedure: INSERTION PORT-A-CATH;  Surgeon: Ovidio Kin, MD;  Location: [facility];  Service: General;  Laterality: N/A;   SMALL INTESTINE SURGERY     SPINE SURGERY     Injections   THYROIDECTOMY N/A 09/20/2019   Procedure: TOTAL THYROIDECTOMY;  Surgeon: Darnell Level, MD;  Location: [facility];  Service: General;  Laterality: N/A;   TUBAL LIGATION  1995    Current Outpatient Medications on File Prior to Visit  Medication Sig Dispense Refill   acyclovir (ZOVIRAX) 400 MG tablet Take 1 tablet (400 mg total) by mouth 2 (two) times daily. 180 tablet 1   anastrozole (ARIMIDEX) 1 MG tablet Take 1 tablet (1 mg total) by mouth daily. 90 tablet 4   Artificial Tear Solution (OPTI-TEARS OP) Place 1 drop into both eyes 3 (three) times daily as needed (for dry/irritated eyes.).     Ascorbic Acid (VITAMIN C) 100 MG  CHEW Chew 100 mg by mouth daily.      BORIC ACID EX Apply topically.      fluticasone (FLONASE) 50 MCG/ACT nasal spray SPRAY 2 SPRAYS INTO EACH NOSTRIL EVERY DAY 16 mL 2   ibuprofen (ADVIL) 800 MG tablet Take 800 mg by mouth 3 (three) times daily as needed.     ipratropium (ATROVENT) 0.03 % nasal spray Place 2 sprays into both nostrils every 12 (twelve) hours. 30 mL 12   levothyroxine (SYNTHROID) 100 MCG tablet Take 1 tablet (100 mcg total) by mouth daily. 90 tablet 3   lidocaine (XYLOCAINE) 5 % ointment Apply 1 Application topically 3 (three) times daily. Uses as needed. 1.25 g 1   Multiple Vitamins-Minerals (VITAMIN D3 COMPLETE PO) Take 1 tablet by mouth daily.      Probiotic Product (ALIGN PO) Take by mouth.      [DISCONTINUED] prochlorperazine (COMPAZINE) 10 MG tablet Take 1  tablet (10 mg total) by mouth every 6 (six) hours as needed (Nausea or vomiting). 30 tablet 1   No current facility-administered medications on file prior to visit.    Allergies  Allergen Reactions   Penicillins Nausea Only, Hives and Nausea And Vomiting    Has patient had a PCN reaction causing immediate rash, facial/tongue/throat swelling, SOB or lightheadedness with hypotension: No Has patient had a PCN reaction causing severe rash involving mucus membranes or skin necrosis: No Has patient had a PCN reaction that required hospitalization: No Has patient had a PCN reaction occurring within the last 10 years: Yes--nausea & headache ONLY If all of the above answers are "NO", then may proceed with Cephalosporin use.  Has patient had a PCN reaction causing immediate rash, facial/tongue/throat swelling, SOB or lightheadedness with hypotension: No Has patient had a PCN reaction causing severe rash involving mucus membranes or skin necrosis: No Has patient had a PCN reaction that required hospitalization: No Has patient had a PCN reaction occurring within the last 10 years: Yes--nausea & headache ONLY If all of the above answers are "NO", then may proceed with Cephalosporin use. Has patient had a PCN reaction causing immediate rash, facial/tongue/throat swelling, SOB or lightheadedness with hypotension: No Has patient had a PCN reaction causing severe rash involving mucus membranes or skin necrosis: No Has patient had a PCN reaction that required hospitalization: No Has patient had a PCN reaction occurring within the last 10 years: Yes--nausea & headache ONLY If all of the above answers are "NO", then may proceed with Cephalosporin use.   Bee Venom    Tramadol Nausea Only      PE Today's Vitals   12/23/22 1124  BP: 128/80  Pulse: 64  SpO2: 100%  Weight: 196 lb (88.9 kg)  Height: 5\' 8"  (1.727 m)   Body mass index is 29.8 kg/m.  Physical Exam Vitals reviewed. Exam conducted with a  chaperone present.  Constitutional:      General: She is not in acute distress.    Appearance: Normal appearance.  HENT:     Head: Normocephalic and atraumatic.     Nose: Nose normal.  Eyes:     Extraocular Movements: Extraocular movements intact.     Conjunctiva/sclera: Conjunctivae normal.  Pulmonary:     Effort: Pulmonary effort is normal.  Genitourinary:    General: Normal vulva.     Exam position: Lithotomy position.     Labia:        Right: No rash, tenderness, lesion or injury.  Left: No rash, tenderness, lesion or injury.      Vagina: Vaginal discharge present.     Comments: White discharge Uterus and cervix absent Stool noted at rectum Musculoskeletal:        General: Normal range of motion.     Cervical back: Normal range of motion.  Lymphadenopathy:     Lower Body: No right inguinal adenopathy. No left inguinal adenopathy.  Neurological:     General: No focal deficit present.     Mental Status: She is alert.  Psychiatric:        Mood and Affect: Mood normal.        Behavior: Behavior normal.       Assessment and Plan:        Vaginal discharge -     WET PREP FOR TRICH, YEAST, CLUE -     SureSwab Advanced Vaginitis Plus,TMA  Screen for STD (sexually transmitted disease) -     SureSwab Advanced Vaginitis Plus,TMA  Genitourinary syndrome of menopause Assessment & Plan: Negative wet mount, will send PCR testing Considering applying coconut oil to the vulva after showers. Also recommend using aquaphor as a barrier cream given fecal smearing with IBS.    Fecal smearing Assessment & Plan: Recommend use of fiber for bulking. Patient also use anti-diarrheals PRN.     Rosalyn Gess, MD

## 2022-12-24 LAB — SURESWAB CT/NG/T. VAGINALIS
C. trachomatis RNA, TMA: NOT DETECTED
N. gonorrhoeae RNA, TMA: NOT DETECTED
Trichomonas vaginalis RNA: NOT DETECTED

## 2022-12-27 ENCOUNTER — Ambulatory Visit: Payer: PPO | Admitting: Rehabilitation

## 2022-12-27 ENCOUNTER — Encounter: Payer: Self-pay | Admitting: Rehabilitation

## 2022-12-27 DIAGNOSIS — Z170 Estrogen receptor positive status [ER+]: Secondary | ICD-10-CM

## 2022-12-27 DIAGNOSIS — I890 Lymphedema, not elsewhere classified: Secondary | ICD-10-CM

## 2022-12-27 DIAGNOSIS — M79602 Pain in left arm: Secondary | ICD-10-CM

## 2022-12-27 DIAGNOSIS — C50412 Malignant neoplasm of upper-outer quadrant of left female breast: Secondary | ICD-10-CM | POA: Diagnosis not present

## 2022-12-27 NOTE — Therapy (Signed)
OUTPATIENT PHYSICAL THERAPY  UPPER EXTREMITY ONCOLOGY TREATMENT  Patient Name: Madison Hopkins MRN: [id number] DOB:1955/09/29, 67 y.o., female Today's Date: 12/27/2022  END OF SESSION:  PT End of Session - 12/27/22 1055     Visit Number 5    Number of Visits 5    PT Start Time 1100    PT Stop Time 1150    PT Time Calculation (min) 50 min    Activity Tolerance Patient tolerated treatment well    Behavior During Therapy [facility] for tasks assessed/performed               Past Medical History:  Diagnosis Date   Abnormal Pap smear of vagina 07/28/2016   LGSIL; colpo 07/2016 atrophic squamous cells; colpo 07/2017 - atypia   Allergy    Arthritis    Breast cancer (HCC) 2018   Metastatic Left breast   Elevated hemoglobin A1c 07/28/2016   level - 6.1   Endometriosis    GERD (gastroesophageal reflux disease)    HSV-2 infection    Iron deficiency anemia    Low vitamin D level 07/28/2016   level 24.7   Personal history of chemotherapy    finished 10/19   Personal history of radiation therapy    finished 03/2018   Thyroid cancer (HCC)    Thyroid neoplasm    Past Surgical History:  Procedure Laterality Date   ABDOMINAL HYSTERECTOMY     BREAST BIOPSY Left 08/09/2017   BREAST LUMPECTOMY Left 01/22/2018   BREAST LUMPECTOMY WITH RADIOACTIVE SEED AND AXILLARY LYMPH NODE DISSECTION Left 01/22/2018   Procedure: LEFT BREAST LUMPECTOMY WITH RADIOACTIVE SEED AND COMPLETE LEFT AXILLARY LYMPH NODE DISSECTION;  Surgeon: Claud Kelp, MD;  Location: [facility];  Service: General;  Laterality: Left;   CESAREAN SECTION     COLON SURGERY     COLOSTOMY     COLOSTOMY REVERSAL     FOOT SURGERY Right    done spur   OOPHORECTOMY     PORT-A-CATH REMOVAL Right 08/28/2018   Procedure: REMOVAL PORT-A-CATH;  Surgeon: Claud Kelp, MD;  Location: [facility];  Service: General;  Laterality: Right;   PORTACATH PLACEMENT N/A 09/06/2017   Procedure: INSERTION  PORT-A-CATH;  Surgeon: Ovidio Kin, MD;  Location: [facility];  Service: General;  Laterality: N/A;   SMALL INTESTINE SURGERY     SPINE SURGERY     Injections   THYROIDECTOMY N/A 09/20/2019   Procedure: TOTAL THYROIDECTOMY;  Surgeon: Darnell Level, MD;  Location: [facility];  Service: General;  Laterality: N/A;   TUBAL LIGATION  1995   Patient Active Problem List   Diagnosis Date Noted   Genitourinary syndrome of menopause 12/23/2022   Irritable bowel syndrome with both constipation and diarrhea 12/23/2022   Fecal smearing 12/23/2022   H/O papillary adenocarcinoma of thyroid 11/17/2022   Acute non-recurrent frontal sinusitis 10/14/2022   Atrophic vaginitis 03/19/2022   History of thyroid cancer 09/15/2020   Papillary thyroid carcinoma (HCC) 04/07/2020   Post-operative hypothyroidism 10/02/2019   S/P total thyroidectomy 10/02/2019   Neoplasm of uncertain behavior of thyroid gland 09/16/2019   Multiple thyroid nodules 09/16/2019   COVID-19 virus infection 02/20/2019   Environmental allergies 01/12/2018   Malignant tumor of breast (HCC) 01/12/2018   Malignant neoplastic disease (HCC) 01/12/2018   Port-A-Cath in place 11/09/2017   Malignant neoplasm of upper-outer quadrant of left breast in female, estrogen receptor positive (HCC) 08/10/2017    PCP: Jarold Motto, PA  REFERRING PROVIDER: Lillard Anes NP  REFERRING DIAG:  Diagnosis  C50.412,Z17.0 (ICD-10-CM) - Malignant neoplasm of upper-outer quadrant of left breast in female, estrogen receptor positive (HCC)    THERAPY DIAG:  Malignant neoplasm of upper-outer quadrant of left breast in female, estrogen receptor positive (HCC)  Lymphedema, not elsewhere classified  Pain in left arm  ONSET DATE: 2020  Rationale for Evaluation and Treatment: Rehabilitation  SUBJECTIVE:                                                                                                                                                                                            SUBJECTIVE STATEMENT: I got my new sleeve and it is fitting well.  Could I get a pink a black of the same size of the sleeve.    EVAL: When I lay on that left side it is hurting.  I haven't used that machine.  The pain is under the arm.  The breast is a little tender and has been since surgery.  I don't think the arm is swelling any worse.  I have been trying to do my massage but I don't think I'm doing it right.  I will need a new sleeve.    PERTINENT HISTORY: left breast cancer, completed neoadjuvant chemo, Left lumpectomy on 01/22/2018 with a total of 11 axillary lymph nodes removed (1+), adjuvant radiation completed 04/20/18, started anastrozole 05/30/2018, status post total thyroidectomy 09/20/2019 for papillary thyroid carcinoma.   PAIN:  Are you having pain? Yes - not really sitting here at rest  NPRS scale: 3-4/10 laying on that side Pain location: left arm Pain orientation: Left  PAIN TYPE: dull, numb Pain description: intermittent  Aggravating factors: lay on that side   Relieving factors: I got a new mattress that helped,   PRECAUTIONS: LT UE lymphedema   RED FLAGS: None   WEIGHT BEARING RESTRICTIONS: No  FALLS:  Has patient fallen in last 6 months? No  OCCUPATION: retired  LEISURE: I have been going to the gym and doing my weights, but I'm not doing it much anymore.   HAND DOMINANCE: right   PRIOR LEVEL OF FUNCTION: Independent  PATIENT GOALS: get back on track   OBJECTIVE:  COGNITION: Overall cognitive status: Within functional limits for tasks assessed   PALPATION: Non pitting  OBSERVATIONS / OTHER ASSESSMENTS: Lt UE slightly larger at the elbow but not hand or lower arm  POSTURE: WNL  UPPER EXTREMITY AROM/PROM:  A/PROM LEFT   eval 10/16 12/27/22  Shoulder extension     Shoulder flexion 140 - pull back of the upper arm 155 - pull  155 - stretch felt top of the  shoulder   Shoulder abduction 160 - tight upper arm  155 -  pull 155 - stretch felt top of shoulder   Shoulder internal rotation Behind the back WNL    Shoulder external rotation Behind the head WNL      (Blank rows = not tested)  LYMPHEDEMA ASSESSMENTS:   LANDMARK RIGHT  eval  At axilla    15 cm proximal to olecranon process 36.2  10 cm proximal to olecranon process 33  Olecranon process 28.7  15 cm proximal to ulnar styloid process 26.2  10 cm proximal to ulnar styloid process 21.9  Just proximal to ulnar styloid process 17.5  Across hand at thumb web space 20.7  At base of 2nd digit 7.0  (Blank rows = not tested)   LANDMARK LEFT  eval  At axilla    15 cm proximal to olecranon process 36  10 cm proximal to olecranon process 35.5  Olecranon process 31.7  15 cm proximal to ulnar styloid process 26.6  10 cm proximal to ulnar styloid process 22.1  Just proximal to ulnar styloid process 17.7  Across hand at thumb web space 21.4  At base of 2nd digit 7.1  (Blank rows = not tested) 9 length    difference.    L-DEX LYMPHEDEMA SCREENING: The patient was assessed using the L-Dex machine today to produce a lymphedema index baseline score. The patient will be reassessed on a regular basis (typically every 3 months) to obtain new L-Dex scores. If the score is > 6.5 points away from his/her baseline score indicating onset of subclinical lymphedema, it will be recommended to wear a compression garment for 4 weeks, 12 hours per day and then be reassessed. If the score continues to be > 6.5 points from baseline at reassessment, we will initiate lymphedema treatment. Assessing in this manner has a 95% rate of preventing clinically significant lymphedema.  LLIS: 45%  TODAY'S TREATMENT:                                                                                                                                          DATE:  12/27/22 Reviewed final HEP and self MLD one more time Pulleys into flexion and abduction x  each  Wall ball rolls into flexion and abduction x 5 each.   Realized that pt only received one of her sleeves - the black one - and that there is a delivery photo for both on separate days - pt took a photo of the shipping ID and will look around for the pink sleeve.  Will order a second black one only then.    12/14/22 Pulleys into flexion and abduction x each  Wall ball rolls into flexion and abduction x 5 each.   Rhomboid/deltoid stretch x 2 bil x 15"  Tricep stretch bil x 15"  Short neck, superficial and deep  abdominals, bilateral axillary nodes, creating interaxillary anastamosis, Lt inguinal nodes and creating Lt axilloinguinal anastamosis, then Lt UE from proximal to distal including cubital nodes focusing deep techniques as needed, then back to supine for all steps in reverse.  STM and MFR to Lt axilla with arm overhead propped on a pillow.    12/07/22 Pulleys into flexion and abduction x each with initial cueing Wall ball rolls into flexion and abduction x 5 each.   Shoulder circles backwards and rhomboid/deltoid stretch x 2 bil Short neck, superficial and deep abdominals, bilateral axillary nodes, creating interaxillary anastamosis, Lt inguinal nodes and creating Lt axilloinguinal anastamosis, then Lt UE from proximal to distal including cubital nodes focusing deep techniques as needed, then in sidelying for posterior interaxillary and lateral trunk work, then back to supine for all steps in reverse.   11/30/22 Measured pt for sleeve. She would like to order a Juzo dynamic size IV Max long in Rose tie dye and a black. With no hand piece.  Will submit to [employer] platform today as able.   Self MLD education in seated: per all steps in instruction section.  Pt was performing this incorrectly and doing more of a STM massage of the arm.  She remembered steps quickly and was given a new handout.    11/17/22 Eval only Education on POC   PATIENT EDUCATION:  Education details:  per today's note Person educated: Patient Education method: Explanation Education comprehension: verbalized understanding  HOME EXERCISE PROGRAM: Self MLD, use sleeve, continue with current gym program and use sleeve as needed (goes to [facility]) , shoulder pulleys and ball rolls on wall, yoga classes, recommended aquatic therapy.    ASSESSMENT:  CLINICAL IMPRESSION: Pt has met all goals and feel ready for D/C today.  Pt will keep up with use of sleeve, self MLD, [employer] classes, and weights at the gym.  Pt can return at any time.  I will order another black sleeve today and we will keep up with trying to find the pink one that is missing at her house.    OBJECTIVE IMPAIRMENTS: decreased activity tolerance, decreased knowledge of use of DME, decreased ROM, and increased edema.   ACTIVITY LIMITATIONS: carrying and lifting  PARTICIPATION LIMITATIONS: community activity  PERSONAL FACTORS: Time since onset of injury/illness/exacerbation are also affecting patient's functional outcome.   REHAB POTENTIAL: Excellent  CLINICAL DECISION MAKING: Stable/uncomplicated  EVALUATION COMPLEXITY: Low  GOALS: Goals reviewed with patient? Yes  SHORT TERM GOALS=LTGs: Target date: 12/24/22  Pt will be measured for and new compression ordered for lymphedema management Baseline: Goal status: MET  2.  Pt will improve Lt UE ROM to no significant pull in the upper arm with overhead flexion Baseline:  Goal status: PARTIALLY MET - still with some pull   3.  Pt will be ind with self MLD for the LT UE for lymphedema management Baseline:  Goal status: MET  4. Pt will be ind with strength ABC or similar for weight lifting instruction   MET   PLAN:  PT FREQUENCY: 1x/week  PT DURATION: other: 5 weeks   PLANNED INTERVENTIONS: Therapeutic exercises, Neuromuscular re-education, Patient/Family education, Self Care, Taping, Manual therapy, and Re-evaluation  PLAN FOR NEXT SESSION: Review Lt UE self MLD,   STM/PROM working into strength ABC 11/30/22: submitted sleeve to [employer] platform  Pt has received her sleeves.   Ordered another black sleeve 12/27/22  PHYSICAL THERAPY DISCHARGE SUMMARY  Visits from Start of Care: 5  Current functional level  related to goals / functional outcomes: All met   Remaining deficits: Lymphedema risk Lt UE   Education / Equipment: Final self care  Plan: Patient agrees to discharge.  Patient is being discharged due to meeting the stated rehab goals.        Idamae Lusher, PT 12/27/2022, 11:55 AM

## 2022-12-28 NOTE — Progress Notes (Signed)
67 y.o. G2P0002 Single African American female here for annual exam.    Seen for vaginitis check on 12/23/22.  Wet mount negative.  Testing negative for both GC/CT.  States she has irritation all the time, not just with sex.   Feels like she always has a UTI.  This is not a new symptom.  Same partner.  Has an intimate relationship with him.  Pain with intercourse.  No bleeding.  Desires STD serum screening.  Hx desquamative vaginitis treated with vaginal hydrocortisone cream.   May come off Arimidex next year.   Hx HSV. Taking Acyclovir.   PCP: Jarold Motto, PA   No LMP recorded (lmp unknown). Patient has had a hysterectomy.           Sexually active: Yes.    The current method of family planning is status post hysterectomy.    Exercising: Yes.     walking Smoker:  no  OB History  Gravida Para Term Preterm AB Living  2 2 0 0 0 2  SAB IAB Ectopic Multiple Live Births  0 0 0 0 0    # Outcome Date GA Lbr Len/2nd Weight Sex Type Anes PTL Lv  2 Para     F CS-Unspec     1 Para     M Vag-Spont        Health Maintenance: Pap:  11/26/20 neg: HR HPV neg, 10/31/19 neg: HR HPV neg History of abnormal Pap:  yes, 08-17-17 colpo showing mild atypia of vagina; 08-10-17 ASCUS:Neg HR HPV.  09/09/16 colpo biopsy showing atrophic squamous cells (menopausal change); 07/28/16 LGSIL  MMG: scheduled 01/2023,01/28/22 Breast Density Cat C, BI-RADS CAT 1 neg Colonoscopy:  cologuard 3 years ago per pt HM Colonoscopy     This patient has no relevant Health Maintenance data.      BMD:  03/02/22  Result  osteoporosis of forearm, osteopenia of bilateral hips. HIV: 10/27/20 NR Hep C: 10/27/20 neg  Immunization History  Administered Date(s) Administered   Fluad Quad(high Dose 65+) 01/31/2022   Fluad Trivalent(High Dose 65+) 11/02/2022   Influenza,inj,Quad PF,6+ Mos 01/14/2020   PFIZER Comirnaty(Gray Top)Covid-19 Tri-Sucrose Vaccine 10/22/2020   PFIZER(Purple Top)SARS-COV-2 Vaccination  07/01/2019, 07/22/2019, 02/16/2020   PNEUMOCOCCAL CONJUGATE-20 01/31/2022   Tdap 10/23/2018      reports that she has never smoked. She has never used smokeless tobacco. She reports current alcohol use of about 1.0 standard drink of alcohol per week. She reports that she does not currently use drugs after having used the following drugs: Marijuana.  Past Medical History:  Diagnosis Date   Abnormal Pap smear of vagina 07/28/2016   LGSIL; colpo 07/2016 atrophic squamous cells; colpo 07/2017 - atypia   Allergy    Arthritis    Breast cancer (HCC) 2018   Metastatic Left breast   Elevated hemoglobin A1c 07/28/2016   level - 6.1   Endometriosis    GERD (gastroesophageal reflux disease)    HSV-2 infection    Iron deficiency anemia    Low vitamin D level 07/28/2016   level 24.7   Personal history of chemotherapy    finished 10/19   Personal history of radiation therapy    finished 03/2018   Thyroid cancer [facility])    Thyroid neoplasm     Past Surgical History:  Procedure Laterality Date   ABDOMINAL HYSTERECTOMY     BREAST BIOPSY Left 08/09/2017   BREAST LUMPECTOMY Left 01/22/2018   BREAST LUMPECTOMY WITH RADIOACTIVE SEED AND AXILLARY LYMPH NODE DISSECTION  Left 01/22/2018   Procedure: LEFT BREAST LUMPECTOMY WITH RADIOACTIVE SEED AND COMPLETE LEFT AXILLARY LYMPH NODE DISSECTION;  Surgeon: Claud Kelp, MD;  Location: [facility];  Service: General;  Laterality: Left;   CESAREAN SECTION     COLON SURGERY     COLOSTOMY     COLOSTOMY REVERSAL     FOOT SURGERY Right    done spur   OOPHORECTOMY     PORT-A-CATH REMOVAL Right 08/28/2018   Procedure: REMOVAL PORT-A-CATH;  Surgeon: Claud Kelp, MD;  Location: [facility];  Service: General;  Laterality: Right;   PORTACATH PLACEMENT N/A 09/06/2017   Procedure: INSERTION PORT-A-CATH;  Surgeon: Ovidio Kin, MD;  Location: [facility] OR;  Service: General;  Laterality: N/A;   SMALL INTESTINE SURGERY     SPINE SURGERY      Injections   THYROIDECTOMY N/A 09/20/2019   Procedure: TOTAL THYROIDECTOMY;  Surgeon: Darnell Level, MD;  Location: [facility];  Service: General;  Laterality: N/A;   TUBAL LIGATION  1995    Current Outpatient Medications  Medication Sig Dispense Refill   acyclovir (ZOVIRAX) 400 MG tablet Take 1 tablet (400 mg total) by mouth 2 (two) times daily. 180 tablet 1   anastrozole (ARIMIDEX) 1 MG tablet Take 1 tablet (1 mg total) by mouth daily. 90 tablet 4   Artificial Tear Solution (OPTI-TEARS OP) Place 1 drop into both eyes 3 (three) times daily as needed (for dry/irritated eyes.).     Ascorbic Acid (VITAMIN C) 100 MG CHEW Chew 100 mg by mouth daily.      BORIC ACID EX Apply topically.      fluticasone (FLONASE) 50 MCG/ACT nasal spray SPRAY 2 SPRAYS INTO EACH NOSTRIL EVERY DAY 16 mL 2   ibuprofen (ADVIL) 800 MG tablet Take 800 mg by mouth 3 (three) times daily as needed.     ipratropium (ATROVENT) 0.03 % nasal spray Place 2 sprays into both nostrils every 12 (twelve) hours. 30 mL 12   levothyroxine (SYNTHROID) 100 MCG tablet Take 1 tablet (100 mcg total) by mouth daily. 90 tablet 3   lidocaine (XYLOCAINE) 5 % ointment Apply 1 Application topically 3 (three) times daily. Uses as needed. 1.25 g 1   Multiple Vitamins-Minerals (VITAMIN D3 COMPLETE PO) Take 1 tablet by mouth daily.      Probiotic Product (ALIGN PO) Take by mouth.      No current facility-administered medications for this visit.    Family History  Problem Relation Age of Onset   Mental illness Mother    Alcoholism Mother    Cirrhosis Mother    Alcohol abuse Mother    Cancer Father    Lung cancer Father     Review of Systems  All other systems reviewed and are negative.   Exam:   BP 128/84 (BP Location: Right Arm, Patient Position: Sitting, Cuff Size: Normal)   Pulse 87   Ht 5' 8.5" (1.74 m)   Wt 199 lb (90.3 kg)   LMP  (LMP Unknown)   SpO2 99%   BMI 29.82 kg/m     General appearance: alert, cooperative and appears  stated age Head: normocephalic, without obvious abnormality, atraumatic Neck: no adenopathy, supple, symmetrical, trachea midline. Lungs: clear to auscultation bilaterally Breasts: normal appearance, no masses or tenderness, No nipple retraction or dimpling, No nipple discharge or bleeding, No axillary adenopathy Heart: regular rate and rhythm Abdomen: soft, non-tender; no masses, no organomegaly Extremities: extremities normal, atraumatic, no cyanosis or edema Skin: skin color, texture,  turgor normal. No rashes or lesions Lymph nodes: cervical, supraclavicular, and axillary nodes normal. Neurologic: grossly normal  Pelvic: External genitalia:  no lesions              No abnormal inguinal nodes palpated.              Urethra:  normal appearing urethra with no masses, tenderness or lesions              Bartholins and Skenes: normal                 Vagina: normal appearing vagina with normal color and discharge, no lesions.  Redness of mucosa and yellow discharge.               Cervix: absent              Pap taken: no Bimanual Exam:  Uterus:  absent              Adnexa: no mass, fullness, tenderness              Rectal exam: yes.  Confirms.              Anus:  normal sphincter tone, no lesions  Chaperone was present for exam:  Warren Lacy, CMA   Assessment:  Encounter for breast and pelvic exam.  Personal history of other medical treatment.  Status post TAH/BSO/appy.  Vaginitis.  I suspect patient does have a combination of atrophy and desquamative vaginitis.  Hx metastatic left breast cancer. On Arimidex.  Hx VAIN I.  Hx HSV II.  On Acyclovir for prevention.  PCP prescribing.  Hx thyroid cancer.   Status post thyroidectomy. Osteoporosis of forearm.  Not treated.   Plan: Mammogram screening discussed. Self breast awareness reviewed. Guidelines for Calcium, Vitamin D, regular exercise program including cardiovascular and weight bearing exercise. Pap and HR HPV in 2025.  Serum STD  screening. Hydrocortisone compounded vaginal cream 3 grams per vagina at hs x 4 weeks.  [facility].  FU in 4 weeks for a recheck and then consider vag vit E prescription suppository Follow up also for yearly exams.  25 min  total time was spent for this patient encounter, including preparation, face-to-face counseling with the patient, coordination of care, and documentation of the encounter in addition to doing the breast and pelvic exam.

## 2022-12-30 DIAGNOSIS — I890 Lymphedema, not elsewhere classified: Secondary | ICD-10-CM | POA: Diagnosis not present

## 2023-01-11 ENCOUNTER — Encounter: Payer: Self-pay | Admitting: Obstetrics and Gynecology

## 2023-01-11 ENCOUNTER — Ambulatory Visit: Payer: PPO | Admitting: Obstetrics and Gynecology

## 2023-01-11 VITALS — BP 128/84 | HR 87 | Ht 68.5 in | Wt 199.0 lb

## 2023-01-11 DIAGNOSIS — Z113 Encounter for screening for infections with a predominantly sexual mode of transmission: Secondary | ICD-10-CM

## 2023-01-11 DIAGNOSIS — Z9289 Personal history of other medical treatment: Secondary | ICD-10-CM | POA: Diagnosis not present

## 2023-01-11 DIAGNOSIS — N761 Subacute and chronic vaginitis: Secondary | ICD-10-CM | POA: Diagnosis not present

## 2023-01-11 DIAGNOSIS — Z114 Encounter for screening for human immunodeficiency virus [HIV]: Secondary | ICD-10-CM | POA: Diagnosis not present

## 2023-01-11 DIAGNOSIS — B009 Herpesviral infection, unspecified: Secondary | ICD-10-CM | POA: Diagnosis not present

## 2023-01-11 DIAGNOSIS — Z9189 Other specified personal risk factors, not elsewhere classified: Secondary | ICD-10-CM | POA: Diagnosis not present

## 2023-01-11 DIAGNOSIS — Z853 Personal history of malignant neoplasm of breast: Secondary | ICD-10-CM

## 2023-01-11 DIAGNOSIS — Z1159 Encounter for screening for other viral diseases: Secondary | ICD-10-CM | POA: Diagnosis not present

## 2023-01-11 DIAGNOSIS — Z01419 Encounter for gynecological examination (general) (routine) without abnormal findings: Secondary | ICD-10-CM

## 2023-01-11 NOTE — Patient Instructions (Signed)

## 2023-01-12 ENCOUNTER — Telehealth: Payer: Self-pay

## 2023-01-12 LAB — HIV ANTIBODY (ROUTINE TESTING W REFLEX): HIV 1&2 Ab, 4th Generation: NONREACTIVE

## 2023-01-12 LAB — HEPATITIS C ANTIBODY: Hepatitis C Ab: NONREACTIVE

## 2023-01-12 LAB — RPR: RPR Ser Ql: NONREACTIVE

## 2023-01-12 MED ORDER — NONFORMULARY OR COMPOUNDED ITEM: 0 refills | Status: DC
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
## 2023-01-13 NOTE — Telephone Encounter (Signed)
Yes, the hydrocortisone cream is 10%, place 3 grams per vagina at hs x 4 weeks.   Thank you.

## 2023-01-14 ENCOUNTER — Encounter: Payer: Self-pay | Admitting: Obstetrics and Gynecology

## 2023-01-25 NOTE — Progress Notes (Signed)
GYNECOLOGY  VISIT   HPI: 67 y.o.   Single  African American female   [phone] with No LMP recorded (lmp unknown). Patient has had a hysterectomy.   here for: 4 week recheck-feels it has helped a little bit.  Patient was tx for DIV with hydrocortisone compounded vaginal cream 3 grams pv at hs for 4 weeks.   Also has atrophy.  Using Replens.  Has a dx right mammogram and US of the right breast for a possible mass.   GYNECOLOGIC HISTORY: No LMP recorded (lmp unknown). Patient has had a hysterectomy. Contraception:  hyst Menopausal hormone therapy:  n/a Last 2 paps:  11/26/20 neg: HR HPV neg, 10/31/19 neg: HR HPV neg  History of abnormal Pap or positive HPV:  yes, 08-17-17 colpo showing mild atypia of vagina; 08-10-17 ASCUS:Neg HR  Mammogram:   scheduled 01/2023,01/28/22 Breast Density Cat C, BI-RADS CAT 1 neg         OB History     Gravida  2   Para  2   Term  0   Preterm  0   AB  0   Living  2      SAB  0   IAB  0   Ectopic  0   Multiple  0   Live Births  0              Patient Active Problem List   Diagnosis Date Noted   Genitourinary syndrome of menopause 12/23/2022   Irritable bowel syndrome with both constipation and diarrhea 12/23/2022   Fecal smearing 12/23/2022   H/O papillary adenocarcinoma of thyroid 11/17/2022   Acute non-recurrent frontal sinusitis 10/14/2022   Atrophic vaginitis 03/19/2022   History of thyroid cancer 09/15/2020   Papillary thyroid carcinoma (HCC) 04/07/2020   Post-operative hypothyroidism 10/02/2019   S/P total thyroidectomy 10/02/2019   Neoplasm of uncertain behavior of thyroid gland 09/16/2019   Multiple thyroid nodules 09/16/2019   COVID-19 virus infection 02/20/2019   Environmental allergies 01/12/2018   Malignant tumor of breast (HCC) 01/12/2018   Malignant neoplastic disease (HCC) 01/12/2018   Port-A-Cath in place 11/09/2017   Malignant neoplasm of upper-outer quadrant of left breast in female, estrogen receptor  positive (HCC) 08/10/2017    Past Medical History:  Diagnosis Date   Abnormal Pap smear of vagina 07/28/2016   LGSIL; colpo 07/2016 atrophic squamous cells; colpo 07/2017 - atypia   Allergy    Arthritis    Breast cancer (HCC) 2018   Metastatic Left breast   Elevated hemoglobin A1c 07/28/2016   level - 6.1   Endometriosis    GERD (gastroesophageal reflux disease)    HSV-2 infection    Iron deficiency anemia    Low vitamin D level 07/28/2016   level 24.7   Osteoporosis 2024   forearm   Personal history of chemotherapy    finished 10/19   Personal history of radiation therapy    finished 03/2018   Thyroid cancer (HCC)    Thyroid neoplasm     Past Surgical History:  Procedure Laterality Date   ABDOMINAL HYSTERECTOMY     BREAST BIOPSY Left 08/09/2017   BREAST LUMPECTOMY Left 01/22/2018   BREAST LUMPECTOMY WITH RADIOACTIVE SEED AND AXILLARY LYMPH NODE DISSECTION Left 01/22/2018   Procedure: LEFT BREAST LUMPECTOMY WITH RADIOACTIVE SEED AND COMPLETE LEFT AXILLARY LYMPH NODE DISSECTION;  Surgeon: Claud Kelp, MD;  Location: [facility];  Service: General;  Laterality: Left;   CESAREAN SECTION     COLON  SURGERY     COLOSTOMY     COLOSTOMY REVERSAL     FOOT SURGERY Right    done spur   OOPHORECTOMY     PORT-A-CATH REMOVAL Right 08/28/2018   Procedure: REMOVAL PORT-A-CATH;  Surgeon: Claud Kelp, MD;  Location: [facility];  Service: General;  Laterality: Right;   PORTACATH PLACEMENT N/A 09/06/2017   Procedure: INSERTION PORT-A-CATH;  Surgeon: Ovidio Kin, MD;  Location: [facility] OR;  Service: General;  Laterality: N/A;   SMALL INTESTINE SURGERY     SPINE SURGERY     Injections   THYROIDECTOMY N/A 09/20/2019   Procedure: TOTAL THYROIDECTOMY;  Surgeon: Darnell Level, MD;  Location: [facility];  Service: General;  Laterality: N/A;   TUBAL LIGATION  1995    Current Outpatient Medications  Medication Sig Dispense Refill   acyclovir (ZOVIRAX) 400 MG  tablet Take 1 tablet (400 mg total) by mouth 2 (two) times daily. 180 tablet 1   anastrozole (ARIMIDEX) 1 MG tablet Take 1 tablet (1 mg total) by mouth daily. 90 tablet 4   Artificial Tear Solution (OPTI-TEARS OP) Place 1 drop into both eyes 3 (three) times daily as needed (for dry/irritated eyes.).     Ascorbic Acid (VITAMIN C) 100 MG CHEW Chew 100 mg by mouth daily.      BORIC ACID EX Apply topically.      fluticasone (FLONASE) 50 MCG/ACT nasal spray SPRAY 2 SPRAYS INTO EACH NOSTRIL EVERY DAY 16 mL 2   ibuprofen (ADVIL) 800 MG tablet Take 800 mg by mouth 3 (three) times daily as needed.     ipratropium (ATROVENT) 0.03 % nasal spray Place 2 sprays into both nostrils every 12 (twelve) hours. 30 mL 12   levothyroxine (SYNTHROID) 100 MCG tablet Take 1 tablet (100 mcg total) by mouth daily. 90 tablet 3   lidocaine (XYLOCAINE) 5 % ointment Apply 1 Application topically 3 (three) times daily. Uses as needed. 1.25 g 1   Multiple Vitamins-Minerals (VITAMIN D3 COMPLETE PO) Take 1 tablet by mouth daily.      NONFORMULARY OR COMPOUNDED ITEM Hydrocortisone compounded vaginal cream.  Place 3 grams per vagina at bedtime for 4 weeks.  Disp:  84 grams, RF  none.  [facility]. 84 each 0   Probiotic Product (ALIGN PO) Take by mouth.      No current facility-administered medications for this visit.     ALLERGIES: Penicillins, Bee venom, and Tramadol  Family History  Problem Relation Age of Onset   Mental illness Mother    Alcoholism Mother    Cirrhosis Mother    Alcohol abuse Mother    Cancer Father    Lung cancer Father     Social History   Socioeconomic History   Marital status: Single    Spouse name: Not on file   Number of children: 2   Years of education: Not on file   Highest education level: Some college, no degree  Occupational History   Not on file  Tobacco Use   Smoking status: Never   Smokeless tobacco: Never  Vaping Use   Vaping status: Never Used  Substance and Sexual  Activity   Alcohol use: Yes    Alcohol/week: 1.0 standard drink of alcohol    Types: 1 Glasses of wine per week    Comment: Social   Drug use: Not Currently    Types: Marijuana    Comment: everyday   Sexual activity: Yes    Birth control/protection: Post-menopausal, Surgical  Comment: Hysterectomy abnormal pap 2018-2019, hx of HSV2  Other Topics Concern   Not on file  Social History Narrative   Retired from [employer] -- retiring next April   Has children    Social Determinants of Health   Financial Resource Strain: Low Risk  (06/30/2022)   Overall Financial Resource Strain (CARDIA)    Difficulty of Paying Living Expenses: Not hard at all  Food Insecurity: No Food Insecurity (06/30/2022)   Hunger Vital Sign    Worried About Running Out of Food in the Last Year: Never true    Ran Out of Food in the Last Year: Never true  Transportation Needs: No Transportation Needs (06/30/2022)   PRAPARE - Administrator, Civil Service (Medical): No    Lack of Transportation (Non-Medical): No  Physical Activity: Inactive (06/30/2022)   Exercise Vital Sign    Days of Exercise per Week: 0 days    Minutes of Exercise per Session: 0 min  Stress: No Stress Concern Present (06/30/2022)   [employer] of Occupational Health - Occupational Stress Questionnaire    Feeling of Stress : Not at all  Social Connections: Moderately Integrated (06/30/2022)   Social Connection and Isolation Panel [NHANES]    Frequency of Communication with Friends and Family: More than three times a week    Frequency of Social Gatherings with Friends and Family: More than three times a week    Attends Religious Services: More than 4 times per year    Active Member of [employer] or Organizations: Yes    Attends Banker Meetings: More than 4 times per year    Marital Status: Never married  Intimate Partner Violence: Not At Risk (06/30/2022)   Humiliation, Afraid, Rape, and Kick questionnaire     Fear of Current or Ex-Partner: No    Emotionally Abused: No    Physically Abused: No    Sexually Abused: No    Review of Systems  All other systems reviewed and are negative.   PHYSICAL EXAMINATION:   BP 122/70 (BP Location: Right Arm, Patient Position: Sitting, Cuff Size: Normal)   Pulse 71   Ht 5' 8.5" (1.74 m)   Wt 199 lb (90.3 kg)   LMP  (LMP Unknown)   SpO2 98%   BMI 29.82 kg/m     General appearance: alert, cooperative and appears stated age  Pelvic: External genitalia:  no lesions              Urethra:  normal appearing urethra with no masses, tenderness or lesions              Bartholins and Skenes: normal                 Vagina: normal appearing vagina with normal color and discharge, no lesions              Cervix:  absent                Bimanual Exam:  Uterus:  absent              Adnexa: no mass, fullness, tenderness     Chaperone was present for exam:  Warren Lacy, CMA  ASSESSMENT:  Desquamative vaginitis, treated with hydrocortisone per vagina.  Vaginal atrophy. Abnormal screening mammogram.   PLAN:  Ok to resume sexual activity.  We discussed OTC vit E products for the vagina.  She will let me know if she would like prescription vitamin  E vaginal suppositories.  Complete dx breast imaging.  Fu prn.

## 2023-01-31 ENCOUNTER — Ambulatory Visit: Payer: PPO

## 2023-02-01 DIAGNOSIS — H2513 Age-related nuclear cataract, bilateral: Secondary | ICD-10-CM | POA: Diagnosis not present

## 2023-02-01 DIAGNOSIS — H40033 Anatomical narrow angle, bilateral: Secondary | ICD-10-CM | POA: Diagnosis not present

## 2023-02-03 ENCOUNTER — Ambulatory Visit
Admission: RE | Admit: 2023-02-03 | Discharge: 2023-02-03 | Disposition: A | Discharge status: Home / Self Care | Payer: PPO | Source: Ambulatory Visit | Attending: Hematology and Oncology

## 2023-02-03 DIAGNOSIS — Z1231 Encounter for screening mammogram for malignant neoplasm of breast: Secondary | ICD-10-CM | POA: Diagnosis not present

## 2023-02-03 DIAGNOSIS — Z170 Estrogen receptor positive status [ER+]: Secondary | ICD-10-CM

## 2023-02-07 ENCOUNTER — Other Ambulatory Visit: Payer: Self-pay | Admitting: Hematology and Oncology

## 2023-02-07 DIAGNOSIS — R928 Other abnormal and inconclusive findings on diagnostic imaging of breast: Secondary | ICD-10-CM

## 2023-02-08 ENCOUNTER — Encounter: Payer: Self-pay | Admitting: Obstetrics and Gynecology

## 2023-02-08 ENCOUNTER — Ambulatory Visit (INDEPENDENT_AMBULATORY_CARE_PROVIDER_SITE_OTHER): Payer: PPO | Admitting: Obstetrics and Gynecology

## 2023-02-08 VITALS — BP 122/70 | HR 71 | Ht 68.5 in | Wt 199.0 lb

## 2023-02-08 DIAGNOSIS — N761 Subacute and chronic vaginitis: Secondary | ICD-10-CM | POA: Diagnosis not present

## 2023-02-08 DIAGNOSIS — N952 Postmenopausal atrophic vaginitis: Secondary | ICD-10-CM | POA: Diagnosis not present

## 2023-02-10 ENCOUNTER — Telehealth: Payer: Self-pay | Admitting: Hematology and Oncology

## 2023-02-10 NOTE — Telephone Encounter (Signed)
Spoke with patient confirming upcoming appointment  

## 2023-02-24 ENCOUNTER — Ambulatory Visit
Admission: RE | Admit: 2023-02-24 | Discharge: 2023-02-24 | Disposition: A | Discharge status: Home / Self Care | Payer: PPO | Source: Ambulatory Visit | Attending: Hematology and Oncology

## 2023-02-24 ENCOUNTER — Other Ambulatory Visit: Payer: Self-pay | Admitting: Hematology and Oncology

## 2023-02-24 DIAGNOSIS — N6310 Unspecified lump in the right breast, unspecified quadrant: Secondary | ICD-10-CM

## 2023-02-24 DIAGNOSIS — N6315 Unspecified lump in the right breast, overlapping quadrants: Secondary | ICD-10-CM | POA: Diagnosis not present

## 2023-02-24 DIAGNOSIS — R928 Other abnormal and inconclusive findings on diagnostic imaging of breast: Secondary | ICD-10-CM

## 2023-02-24 DIAGNOSIS — N6001 Solitary cyst of right breast: Secondary | ICD-10-CM | POA: Diagnosis not present

## 2023-02-27 DIAGNOSIS — H25813 Combined forms of age-related cataract, bilateral: Secondary | ICD-10-CM | POA: Diagnosis not present

## 2023-02-28 ENCOUNTER — Other Ambulatory Visit: Payer: Self-pay

## 2023-02-28 ENCOUNTER — Telehealth: Payer: Self-pay

## 2023-02-28 DIAGNOSIS — N898 Other specified noninflammatory disorders of vagina: Secondary | ICD-10-CM

## 2023-02-28 DIAGNOSIS — M5412 Radiculopathy, cervical region: Secondary | ICD-10-CM | POA: Diagnosis not present

## 2023-02-28 MED ORDER — LIDOCAINE 5 % EX OINT: 1.0000 | TOPICAL_OINTMENT | Freq: Three times a day (TID) | CUTANEOUS | 0 refills | Status: DC

## 2023-02-28 NOTE — Telephone Encounter (Signed)
 Med refill request: lidocaine 5% ointment Last OV: 02/08/23 Last AEX: 01/11/23 Next AEX: 01/16/24 Last MMG (if hormonal med) none Refill sent to provider for approval or denial as appropriate.

## 2023-02-28 NOTE — Telephone Encounter (Signed)
PA started via CoverMyMeds.

## 2023-03-06 ENCOUNTER — Ambulatory Visit
Admission: EM | Admit: 2023-03-06 | Discharge: 2023-03-06 | Disposition: A | Discharge status: Home / Self Care | Payer: PPO | Attending: Family Medicine | Admitting: Family Medicine

## 2023-03-06 ENCOUNTER — Encounter: Payer: Self-pay | Admitting: *Deleted

## 2023-03-06 ENCOUNTER — Other Ambulatory Visit: Payer: Self-pay

## 2023-03-06 DIAGNOSIS — J0140 Acute pansinusitis, unspecified: Secondary | ICD-10-CM

## 2023-03-06 DIAGNOSIS — J22 Unspecified acute lower respiratory infection: Secondary | ICD-10-CM | POA: Diagnosis not present

## 2023-03-06 MED ORDER — DOXYCYCLINE HYCLATE 100 MG PO CAPS
100.0000 mg | ORAL_CAPSULE | Freq: Two times a day (BID) | ORAL | 0 refills | Status: DC
Start: 2023-03-06 — End: 2023-03-20

## 2023-03-06 MED ORDER — FLUCONAZOLE 150 MG PO TABS: 150.0000 mg | ORAL_TABLET | ORAL | 0 refills | Status: DC | PRN

## 2023-03-06 MED ORDER — PROMETHAZINE-DM 6.25-15 MG/5ML PO SYRP: 5.0000 mL | ORAL_SOLUTION | Freq: Three times a day (TID) | ORAL | 0 refills | Status: DC | PRN

## 2023-03-06 NOTE — ED Triage Notes (Signed)
 Cough since New Years Day- now productive. Chest hurts with cough. Using Mucinex and nasal spray. Denies fever

## 2023-03-06 NOTE — Discharge Instructions (Signed)
 Marland Kitchen

## 2023-03-06 NOTE — ED Provider Notes (Signed)
 [facility]    CSN: [id number] Arrival date & time: 03/06/23  1113      History   Chief Complaint Chief Complaint  Patient presents with   Cough    HPI Madison Hopkins is a 68 y.o. female.  Patient presents today with a productive cough and chest congestion for 6 days.  She has been using Mucinex and over-the-counter nasal sprays without any improvement of symptoms. She is concerned as the cough appears to be worsening.  Patient has chronic allergies throughout the year and always has some form of nasal congestion and drainage however through the course of this illness symptoms nasal congestion and drainage worsened. Past Medical History:  Diagnosis Date   Abnormal Pap smear of vagina 07/28/2016   LGSIL; colpo 07/2016 atrophic squamous cells; colpo 07/2017 - atypia   Allergy    Arthritis    Breast cancer (HCC) 2018   Metastatic Left breast   Elevated hemoglobin A1c 07/28/2016   level - 6.1   Endometriosis    GERD (gastroesophageal reflux disease)    HSV-2 infection    Iron deficiency anemia    Low vitamin D  level 07/28/2016   level 24.7   Osteoporosis 2024   forearm   Personal history of chemotherapy    finished 10/19   Personal history of radiation therapy    finished 03/2018   Thyroid  cancer (HCC)    Thyroid  neoplasm     Patient Active Problem List   Diagnosis Date Noted   Genitourinary syndrome of menopause 12/23/2022   Irritable bowel syndrome with both constipation and diarrhea 12/23/2022   Fecal smearing 12/23/2022   H/O papillary adenocarcinoma of thyroid  11/17/2022   Acute non-recurrent frontal sinusitis 10/14/2022   Atrophic vaginitis 03/19/2022   History of thyroid  cancer 09/15/2020   Papillary thyroid  carcinoma (HCC) 04/07/2020   Post-operative hypothyroidism 10/02/2019   S/P total thyroidectomy 10/02/2019   Neoplasm of uncertain behavior of thyroid  gland 09/16/2019   Multiple thyroid  nodules 09/16/2019   COVID-19 virus infection  02/20/2019   Environmental allergies 01/12/2018   Malignant tumor of breast (HCC) 01/12/2018   Malignant neoplastic disease (HCC) 01/12/2018   Port-A-Cath in place 11/09/2017   Malignant neoplasm of upper-outer quadrant of left breast in female, estrogen receptor positive (HCC) 08/10/2017    Past Surgical History:  Procedure Laterality Date   ABDOMINAL HYSTERECTOMY     BREAST BIOPSY Left 08/09/2017   BREAST LUMPECTOMY Left 01/22/2018   BREAST LUMPECTOMY WITH RADIOACTIVE SEED AND AXILLARY LYMPH NODE DISSECTION Left 01/22/2018   Procedure: LEFT BREAST LUMPECTOMY WITH RADIOACTIVE SEED AND COMPLETE LEFT AXILLARY LYMPH NODE DISSECTION;  Surgeon: Gail Favorite, MD;  Location: [facility];  Service: General;  Laterality: Left;   CESAREAN SECTION     COLON SURGERY     COLOSTOMY     COLOSTOMY REVERSAL     FOOT SURGERY Right    done spur   OOPHORECTOMY     PORT-A-CATH REMOVAL Right 08/28/2018   Procedure: REMOVAL PORT-A-CATH;  Surgeon: Gail Favorite, MD;  Location: [facility];  Service: General;  Laterality: Right;   PORTACATH PLACEMENT N/A 09/06/2017   Procedure: INSERTION PORT-A-CATH;  Surgeon: Ethyl Lenis, MD;  Location: [facility] OR;  Service: General;  Laterality: N/A;   SMALL INTESTINE SURGERY     SPINE SURGERY     Injections   THYROIDECTOMY N/A 09/20/2019   Procedure: TOTAL THYROIDECTOMY;  Surgeon: Eletha Boas, MD;  Location: [facility];  Service: General;  Laterality: N/A;  TUBAL LIGATION  1995    OB History     Gravida  2   Para  2   Term  0   Preterm  0   AB  0   Living  2      SAB  0   IAB  0   Ectopic  0   Multiple  0   Live Births  0            Home Medications    Prior to Admission medications   Medication Sig Start Date End Date Taking? Authorizing Provider  acyclovir  (ZOVIRAX ) 400 MG tablet Take 1 tablet (400 mg total) by mouth 2 (two) times daily. 11/02/22  Yes Job Lukes, PA  anastrozole  (ARIMIDEX ) 1 MG  tablet Take 1 tablet (1 mg total) by mouth daily. 01/26/22  Yes Iruku, Praveena, MD  Artificial Tear Solution (OPTI-TEARS OP) Place 1 drop into both eyes 3 (three) times daily as needed (for dry/irritated eyes.).   Yes [provider]  Ascorbic Acid (VITAMIN C) 100 MG CHEW Chew 100 mg by mouth daily.    Yes [provider]  BORIC ACID EX Apply topically.    Yes [provider]  doxycycline  (VIBRAMYCIN ) 100 MG capsule Take 1 capsule (100 mg total) by mouth 2 (two) times daily. 03/06/23  Yes Arloa Suzen RAMAN, NP  fluconazole  (DIFLUCAN ) 150 MG tablet Take 1 tablet (150 mg total) by mouth every three (3) days as needed (yeast related to antibiotic therapy). Repeat if needed 03/06/23  Yes Arloa Suzen RAMAN, NP  fluticasone  (FLONASE ) 50 MCG/ACT nasal spray SPRAY 2 SPRAYS INTO EACH NOSTRIL EVERY DAY 06/07/22  Yes Job Lukes, PA  ipratropium (ATROVENT ) 0.03 % nasal spray Place 2 sprays into both nostrils every 12 (twelve) hours. 10/14/22  Yes Sebastian Beverley NOVAK, MD  levothyroxine  (SYNTHROID ) 100 MCG tablet Take 1 tablet (100 mcg total) by mouth daily. 11/17/22  Yes Shamleffer, Ibtehal Jaralla, MD  lidocaine  (XYLOCAINE ) 5 % ointment Apply 1 Application topically 3 (three) times daily. Uses as needed. 02/28/23  Yes Amundson C Silva, Brook E, MD  Multiple Vitamins-Minerals (VITAMIN D3 COMPLETE PO) Take 1 tablet by mouth daily.    Yes [provider]  NONFORMULARY OR COMPOUNDED ITEM Hydrocortisone  compounded vaginal cream.  Place 3 grams per vagina at bedtime for 4 weeks.  Disp:  84 grams, RF  none.  [facility]. 01/12/23  Yes Cathlyn JAYSON Nikki Bobie FORBES, MD  promethazine -dextromethorphan (PROMETHAZINE -DM) 6.25-15 MG/5ML syrup Take 5 mLs by mouth 3 (three) times daily as needed for cough. 03/06/23  Yes Arloa Suzen RAMAN, NP  ibuprofen  (ADVIL ) 800 MG tablet Take 800 mg by mouth 3 (three) times daily as needed. Patient not taking: Reported on 03/06/2023 10/02/20   [provider]  Probiotic Product (ALIGN PO) Take by mouth.     [provider]  prochlorperazine  (COMPAZINE ) 10 MG tablet Take 1 tablet (10 mg total) by mouth every 6 (six) hours as needed (Nausea or vomiting). 11/09/17 02/01/18  Crawford Morna Pickle, NP    Family History Family History  Problem Relation Age of Onset   Mental illness Mother    Alcoholism Mother    Cirrhosis Mother    Alcohol abuse Mother    Cancer Father    Lung cancer Father     Social History Social History   Tobacco Use   Smoking status: Never   Smokeless tobacco: Never  Vaping Use   Vaping status: Never Used  Substance Use Topics   Alcohol use: Yes    Alcohol/week: 1.0 standard drink of alcohol    Types: 1 Glasses of wine per week    Comment: Social   Drug use: Not Currently    Types: Marijuana    Comment: everyday     Allergies   Penicillins, Bee venom, and Tramadol    Review of Systems Review of Systems  Respiratory:  Positive for cough.      Physical Exam Triage Vital Signs ED Triage Vitals  Encounter Vitals Group     BP 03/06/23 1211 (!) 150/90     Systolic BP Percentile --      Diastolic BP Percentile --      Pulse Rate 03/06/23 1211 76     Resp 03/06/23 1211 16     Temp 03/06/23 1211 99.3 F (37.4 C)     Temp Source 03/06/23 1211 Oral     SpO2 03/06/23 1211 99 %     Weight --      Height --      Head Circumference --      Peak Flow --      Pain Score 03/06/23 1206 0     Pain Loc --      Pain Education --      Exclude from Growth Chart --    No data found.  Updated Vital Signs BP (!) 150/90 (BP Location: Right Arm)   Pulse 76   Temp 99.3 F (37.4 C) (Oral)   Resp 16   LMP  (LMP Unknown)   SpO2 99%   Visual Acuity Right Eye Distance:   Left Eye Distance:   Bilateral Distance:    Right Eye Near:   Left Eye Near:    Bilateral Near:     Physical Exam Constitutional:      Appearance: She is ill-appearing.  HENT:     Head: Normocephalic and  atraumatic.     Right Ear: Tympanic membrane, ear canal and external ear normal.     Left Ear: Tympanic membrane, ear canal and external ear normal.     Nose: Mucosal edema, congestion and rhinorrhea present. Rhinorrhea is purulent.  Eyes:     Extraocular Movements: Extraocular movements intact.     Pupils: Pupils are equal, round, and reactive to light.  Cardiovascular:     Rate and Rhythm: Normal rate and regular rhythm.  Pulmonary:     Effort: Pulmonary effort is normal.     Breath sounds: Normal breath sounds.  Musculoskeletal:     Cervical back: Normal range of motion and neck supple.  Skin:    General: Skin is warm and dry.     Capillary Refill: Capillary refill takes less than 2 seconds.  Neurological:     General: No focal deficit present.     Mental Status: She is alert and oriented to person, place, and time.      UC Treatments / Results  Labs (all labs ordered are listed, but only abnormal results are displayed) Labs Reviewed - No data to display  EKG   Radiology No results found.  Procedures Procedures (including critical care time)  Medications Ordered in UC Medications - No data to display  Initial Impression / Assessment and Plan / UC Course  I have reviewed the triage vital signs and the nursing notes.  Pertinent labs & imaging results that were available during my care of the patient were reviewed by me and considered in my medical decision making (see  chart for details).    Acute sinusitis empiric treatment with doxycycline  100 mg twice daily x 10 days, patient has a penicillin allergy.  Patient also has a secondary lower respiratory infection, which most bacterial infection sensitive to doxycycline . Promethazine  DM prescribed for cough as needed.  Prescribed prophylaxis Diflucan  for antibiotic therapy induced yeast infection.Encouraged rest and hydrate well with fluids.  Return precautions given if symptoms worsen or do not improve. Final Clinical  Impressions(s) / UC Diagnoses   Final diagnoses:  Acute non-recurrent pansinusitis  Acute lower respiratory infection     Discharge Instructions            ED Prescriptions     Medication Sig Dispense Auth. Provider   promethazine -dextromethorphan (PROMETHAZINE -DM) 6.25-15 MG/5ML syrup Take 5 mLs by mouth 3 (three) times daily as needed for cough. 180 mL Arloa Suzen RAMAN, NP   doxycycline  (VIBRAMYCIN ) 100 MG capsule Take 1 capsule (100 mg total) by mouth 2 (two) times daily. 20 capsule Arloa Suzen RAMAN, NP   fluconazole  (DIFLUCAN ) 150 MG tablet Take 1 tablet (150 mg total) by mouth every three (3) days as needed (yeast related to antibiotic therapy). Repeat if needed 2 tablet Arloa Suzen RAMAN, NP      PDMP not reviewed this encounter.   Arloa Suzen RAMAN, NP 03/06/23 1251

## 2023-03-08 ENCOUNTER — Inpatient Hospital Stay: Payer: PPO | Attending: Physician Assistant | Admitting: Hematology and Oncology

## 2023-03-08 DIAGNOSIS — C50412 Malignant neoplasm of upper-outer quadrant of left female breast: Secondary | ICD-10-CM | POA: Diagnosis not present

## 2023-03-08 DIAGNOSIS — Z170 Estrogen receptor positive status [ER+]: Secondary | ICD-10-CM | POA: Diagnosis not present

## 2023-03-08 NOTE — Progress Notes (Signed)
 [facility]  [phone] [phone]) [phone]    ID: Madison Hopkins DOB: 09/16/1955  MR#: [id number]  RDW#:[id number]  Patient Care Team: Job Lukes, PA as PCP - General (Physician Assistant) [name], Sandria BROCKS, MD (Inactive) as Consulting Physician (Oncology) Cathlyn BROCKS Cary, Bobie BRAVO, MD as Consulting Physician (Obstetrics and Gynecology) Bensimhon, Toribio SAUNDERS, MD as Consulting Physician (Cardiology) Cesario Boer, MD as Attending Physician (Physical Medicine and Rehabilitation) [facility], Donell Cardinal, MD as Consulting Physician (Endocrinology) Aron Shoulders, MD as Consulting Physician (General Surgery) OTHER MD:   CHIEF COMPLAINT: HER-2 positive, weakly estrogen receptor positive breast cancer  CURRENT TREATMENT: Anastrozole   INTERVAL HISTORY:  Madison Hopkins returns today for follow-up of her HER-2 positive, weakly estrogen receptor positive breast cancer.  Madison Hopkins is here for telephone follow up. She wanted to review her mammogram and ultrasound results. She tells me that she is worried about having to wait for 6 months to do a repeat ultrasound.  She was wondering if we can discuss with radiology to see if she needs a biopsy sooner than that.  She is otherwise taking anastrozole  as prescribed and denies any new health issues.  She is very apprehensive about the mammogram findings   COVID 19 VACCINATION STATUS: Pfizer x3, most recently 01/2020; infection 01/2019 and 08/2020 (received molnupiravir )   HISTORY OF CURRENT ILLNESS: From the original intake note:  Madison Hopkins noted a mass in the left axilla sometime in January or February 2019.  She eventually brought her to her gynecologist's attention, and underwent bilateral diagnostic mammography with tomography and left breast ultrasonography at [facility] on 08/04/2017 showing: breast density category B. There is a highly suspicious hypoechoic mass in the left breast at the 2 o'clock upper outer  quadrant measuring 2.3 x 1.6 x 2.2 cm, located 2 cm from the nipple. Sonographically, there were 2 enlarged lymph nodes in the left axilla, the largest with cortical thickening measuring 2.5 cm. No evidence of malignancy was seen in the right breast.   Accordingly on 08/07/2017 she proceeded to biopsy of the left breast area and 1 of the lymph nodes in question. The pathology from this procedure showed ([id number]): Invasive ductal carcinoma, grade 3. Metastatic carcinoma was found in one left axillary lymph node. Prognostic indicators significant for: estrogen receptor, 30% positive with weak staining intensity and progesterone receptor, 0% negative. Proliferation marker Ki67 at 80%. HER2 amplified with ratios HER2/CEP17 signals 2.32 and average HER2 copies per cell 6.60  The patient's subsequent history is as detailed below.   PAST MEDICAL HISTORY: Past Medical History:  Diagnosis Date   Abnormal Pap smear of vagina 07/28/2016   LGSIL; colpo 07/2016 atrophic squamous cells; colpo 07/2017 - atypia   Allergy    Arthritis    Breast cancer (HCC) 2018   Metastatic Left breast   Elevated hemoglobin A1c 07/28/2016   level - 6.1   Endometriosis    GERD (gastroesophageal reflux disease)    HSV-2 infection    Iron deficiency anemia    Low vitamin D  level 07/28/2016   level 24.7   Osteoporosis 2024   forearm   Personal history of chemotherapy    finished 10/19   Personal history of radiation therapy    finished 03/2018   Thyroid  cancer (HCC)    Thyroid  neoplasm   GERD but no ulcers   PAST SURGICAL HISTORY: Past Surgical History:  Procedure Laterality Date   ABDOMINAL HYSTERECTOMY     BREAST BIOPSY Left 08/09/2017  BREAST LUMPECTOMY Left 01/22/2018   BREAST LUMPECTOMY WITH RADIOACTIVE SEED AND AXILLARY LYMPH NODE DISSECTION Left 01/22/2018   Procedure: LEFT BREAST LUMPECTOMY WITH RADIOACTIVE SEED AND COMPLETE LEFT AXILLARY LYMPH NODE DISSECTION;  Surgeon: Gail Favorite, MD;  Location:  [facility];  Service: General;  Laterality: Left;   CESAREAN SECTION     COLON SURGERY     COLOSTOMY     COLOSTOMY REVERSAL     FOOT SURGERY Right    done spur   OOPHORECTOMY     PORT-A-CATH REMOVAL Right 08/28/2018   Procedure: REMOVAL PORT-A-CATH;  Surgeon: Gail Favorite, MD;  Location: [facility];  Service: General;  Laterality: Right;   PORTACATH PLACEMENT N/A 09/06/2017   Procedure: INSERTION PORT-A-CATH;  Surgeon: Ethyl Lenis, MD;  Location: [facility];  Service: General;  Laterality: N/A;   SMALL INTESTINE SURGERY     SPINE SURGERY     Injections   THYROIDECTOMY N/A 09/20/2019   Procedure: TOTAL THYROIDECTOMY;  Surgeon: Eletha Boas, MD;  Location: [facility];  Service: General;  Laterality: N/A;   TUBAL LIGATION  1995  Hysterectomy with salpingo-oophorectomy, Tonsillectomy, Plantar Fascitis Right Foot Surgery    FAMILY HISTORY: Family History  Problem Relation Age of Onset   Mental illness Mother    Alcoholism Mother    Cirrhosis Mother    Alcohol abuse Mother    Cancer Father    Lung cancer Father    The patient' father died at age 6 due to lung cancer (heavy smoker). The patient's mother died due to liver cirrhosis (heavy drinker). The patient has 2 brothers and no sisters. There was a paternal 1st cousin with colon cancer diagnosed in the mid 40's, who also had cervical cancer. The mother of this 1st cousin (the patient's paternal aunt) had cancer (the patient's is unsure of what type). There was also a paternal uncle with prostate cancer diagnosed in the 26's. The patient denies a family history of breast or ovarian cancer.     GYNECOLOGIC HISTORY:  No LMP recorded (lmp unknown). Patient has had a hysterectomy. Menarche: 68 years old Age at first live birth: 68 years old She is GXP2.  The patient is status post total hysterectomy with bilateral salpingo-oophorectomy in 1995.  She never used contraception. She notes that she had an estrogen  shot one time but no other HRTs.    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  She describes herself single. At home is herself and no pets. Her son, Saverio lives in [place], [place] where he works as a Loss Adjuster, Chart[employer]. The patient's daughter Roselie lives in [place]  in customer service for [employer]. The patient has 5 grandchildren and 4 great grandchildren. She attends [employer].   ADVANCED DIRECTIVES: at the 08/16/2017 visit the patient was given the appropriate forms to complete on notarized at her discretion   HEALTH MAINTENANCE: Social History   Tobacco Use   Smoking status: Never   Smokeless tobacco: Never  Vaping Use   Vaping status: Never Used  Substance Use Topics   Alcohol use: Yes    Alcohol/week: 1.0 standard drink of alcohol    Types: 1 Glasses of wine per week    Comment: Social   Drug use: Not Currently    Types: Marijuana    Comment: everyday     Colonoscopy: 2009?  PAP: 10/2019, negative  Bone density: 10/24/2016, -0.7   Allergies  Allergen Reactions   Penicillins Nausea Only, Hives and Nausea And Vomiting    Has patient had a PCN reaction causing immediate rash, facial/tongue/throat swelling, SOB or lightheadedness with hypotension: No Has patient had a PCN reaction causing severe rash involving mucus membranes or skin necrosis: No Has patient had a PCN reaction that required hospitalization: No Has patient had a PCN reaction occurring within the last 10 years: Yes--nausea & headache ONLY If all of the above answers are NO, then may proceed with Cephalosporin use.  Has patient had a PCN reaction causing immediate rash, facial/tongue/throat swelling, SOB or lightheadedness with hypotension: No Has patient had a PCN reaction causing severe rash involving mucus membranes or skin necrosis: No Has patient had a PCN reaction that required hospitalization: No Has patient had a PCN reaction occurring  within the last 10 years: Yes--nausea & headache ONLY If all of the above answers are NO, then may proceed with Cephalosporin use. Has patient had a PCN reaction causing immediate rash, facial/tongue/throat swelling, SOB or lightheadedness with hypotension: No Has patient had a PCN reaction causing severe rash involving mucus membranes or skin necrosis: No Has patient had a PCN reaction that required hospitalization: No Has patient had a PCN reaction occurring within the last 10 years: Yes--nausea & headache ONLY If all of the above answers are NO, then may proceed with Cephalosporin use.   Bee Venom    Tramadol  Nausea Only    Current Outpatient Medications  Medication Sig Dispense Refill   acyclovir  (ZOVIRAX ) 400 MG tablet Take 1 tablet (400 mg total) by mouth 2 (two) times daily. 180 tablet 1   anastrozole  (ARIMIDEX ) 1 MG tablet Take 1 tablet (1 mg total) by mouth daily. 90 tablet 4   Artificial Tear Solution (OPTI-TEARS OP) Place 1 drop into both eyes 3 (three) times daily as needed (for dry/irritated eyes.).     Ascorbic Acid (VITAMIN C) 100 MG CHEW Chew 100 mg by mouth daily.      BORIC ACID EX Apply topically.      doxycycline  (VIBRAMYCIN ) 100 MG capsule Take 1 capsule (100 mg total) by mouth 2 (two) times daily. 20 capsule 0   fluconazole  (DIFLUCAN ) 150 MG tablet Take 1 tablet (150 mg total) by mouth every three (3) days as needed (yeast related to antibiotic therapy). Repeat if needed 2 tablet 0   fluticasone  (FLONASE ) 50 MCG/ACT nasal spray SPRAY 2 SPRAYS INTO EACH NOSTRIL EVERY DAY 16 mL 2   ibuprofen  (ADVIL ) 800 MG tablet Take 800 mg by mouth 3 (three) times daily as needed. (Patient not taking: Reported on 03/06/2023)     ipratropium (ATROVENT ) 0.03 % nasal spray Place 2 sprays into both nostrils every 12 (twelve) hours. 30 mL 12   levothyroxine  (SYNTHROID ) 100 MCG tablet Take 1 tablet (100 mcg total) by mouth daily. 90 tablet 3   lidocaine  (XYLOCAINE ) 5 % ointment Apply 1  Application topically 3 (three) times daily. Uses as needed. 1.25 g 0   Multiple Vitamins-Minerals (VITAMIN D3 COMPLETE PO) Take 1 tablet by mouth daily.      NONFORMULARY OR COMPOUNDED ITEM Hydrocortisone  compounded vaginal cream.  Place 3 grams per vagina at bedtime for 4 weeks.  Disp:  84 grams, RF  none.  [facility]. 84 each 0   Probiotic Product (ALIGN PO) Take by mouth.      promethazine -dextromethorphan (PROMETHAZINE -DM) 6.25-15 MG/5ML syrup Take 5 mLs by mouth 3 (three) times daily as needed for cough. 180 mL 0  No current facility-administered medications for this visit.    OBJECTIVE: African-American woman who appears younger than stated age  There were no vitals filed for this visit.    There is no height or weight on file to calculate BMI.   Wt Readings from Last 3 Encounters:  02/08/23 199 lb (90.3 kg)  01/11/23 199 lb (90.3 kg)  12/23/22 196 lb (88.9 kg)  ECOG FS:1 - Symptomatic but completely ambulatory  Sclerae unicteric, EOMs intact Neck: No cervical or regional adenopathy Breast: Left breast status postlumpectomy and radiation.  No palpable masses in bilateral breast.  No regional adenopathy.  LAB RESULTS:  CMP     Component Value Date/Time   NA 138 10/19/2022 1335   K 3.4 (L) 10/19/2022 1335   CL 103 10/19/2022 1335   CO2 27 10/19/2022 1335   GLUCOSE 91 10/19/2022 1335   BUN 12 10/19/2022 1335   CREATININE 0.89 10/19/2022 1335   CREATININE 0.94 12/22/2021 1133   CREATININE 0.90 01/14/2020 0852   CALCIUM  10.2 10/19/2022 1335   PROT 7.7 10/19/2022 1335   ALBUMIN 4.5 10/19/2022 1335   AST 16 10/19/2022 1335   AST 13 (L) 12/22/2021 1133   ALT 21 10/19/2022 1335   ALT 15 12/22/2021 1133   ALKPHOS 116 10/19/2022 1335   BILITOT 0.6 10/19/2022 1335   BILITOT 0.6 12/22/2021 1133   GFRNONAA >60 10/06/2022 1308   GFRNONAA >60 12/22/2021 1133   GFRAA >60 09/21/2019 0627   GFRAA >60 10/25/2017 1055    No results found for: TOTALPROTELP,  ALBUMINELP, A1GS, A2GS, BETS, BETA2SER, GAMS, MSPIKE, SPEI  No results found for: JONATHAN BONG, KAPLAMBRATIO  Lab Results  Component Value Date   WBC 9.3 10/19/2022   NEUTROABS 4.8 10/12/2022   HGB 12.2 10/19/2022   HCT 37.8 10/19/2022   MCV 84.2 10/19/2022   PLT 331.0 10/19/2022   No results found for: LABCA2  No components found for: [id number]  No results for input(s): INR in the last 168 hours.  No results found for: LABCA2  No results found for: RJW800  No results found for: CAN125  No results found for: CAN153  No results found for: CA2729  No components found for: HGQUANT  No results found for: CEA1, CEA / No results found for: CEA1, CEA   No results found for: AFPTUMOR  No results found for: CHROMOGRNA  No results found for: TOTALPROTELP, ALBUMINELP, A1GS, A2GS, BETS, BETA2SER, GAMS, MSPIKE, SPEI (this displays SPEP labs)  No results found for: KPAFRELGTCHN, LAMBDASER, KAPLAMBRATIO (kappa/lambda light chains)  No results found for: HGBA, HGBA2QUANT, HGBFQUANT, HGBSQUAN (Hemoglobinopathy evaluation)   No results found for: LDH  Lab Results  Component Value Date   IRON 67 10/19/2022   TIBC 399.0 10/19/2022   IRONPCTSAT 16.8 (L) 10/19/2022   (Iron and TIBC)  Lab Results  Component Value Date   FERRITIN 65.6 10/19/2022    Urinalysis    Component Value Date/Time   COLORURINE YELLOW 10/19/2022 1335   APPEARANCEUR CLEAR 10/19/2022 1335   LABSPEC 1.010 10/19/2022 1335   PHURINE 7.0 10/19/2022 1335   GLUCOSEU NEGATIVE 10/19/2022 1335   HGBUR NEGATIVE 10/19/2022 1335   BILIRUBINUR NEGATIVE 10/19/2022 1335   BILIRUBINUR neg 02/01/2021 1458   KETONESUR NEGATIVE 10/19/2022 1335   PROTEINUR NEGATIVE 11/18/2021 1509   UROBILINOGEN 0.2 10/19/2022 1335   NITRITE NEGATIVE 10/19/2022 1335   LEUKOCYTESUR NEGATIVE 10/19/2022 1335    STUDIES: MM 3D  DIAGNOSTIC MAMMOGRAM UNILATERAL RIGHT BREAST Result Date: 02/24/2023 CLINICAL DATA:  Screening  recall for possible right breast masses. The patient has history of left breast cancer status post lumpectomy in 2019. EXAM: DIGITAL DIAGNOSTIC UNILATERAL RIGHT MAMMOGRAM WITH TOMOSYNTHESIS AND CAD; ULTRASOUND RIGHT BREAST LIMITED TECHNIQUE: Right digital diagnostic mammography and breast tomosynthesis was performed. The images were evaluated with computer-aided detection. ; Targeted ultrasound examination of the right breast was performed COMPARISON:  Previous exam(s). ACR Breast Density Category b: There are scattered areas of fibroglandular density. FINDINGS: Spot compression tomosynthesis images of the right breast demonstrates 2 adjacent masses in the inferior to lower inner right breast. The more anterior mass measures approximately 1.0 cm, and the more posterior measures 0.8 cm. In the lateral to upper outer periareolar right breast, there is a small lobulated oval circumscribed mass measuring 0.6 cm. Ultrasound of the right breast at 5 o'clock, 2 cm from the nipple demonstrates a lobulated anechoic circumscribed mass measuring 1.0 x 0.5 x 0.9 cm. At 6 o'clock, 3 cm from the nipple there is a near anechoic oval circumscribed mass measuring 0.7 x 0.3 x 0.6 cm. In the right breast at 9:30, 4 cm from the nipple there is a oval mass with anechoic spaces measuring 0.6 x 0.3 x 0.3 cm. Ultrasound of the right axilla demonstrates multiple normal-appearing lymph nodes. IMPRESSION: 1. There are 2 likely benign masses in the right breast at 6 o'clock and at 9:30, most likely representing mildly complicated cysts. 2.  There is a benign cyst in the right breast at 5 o'clock. RECOMMENDATION: Six-month follow-up right breast ultrasound for the 2 right breast masses at 6 o'clock and 930. I have discussed the findings and recommendations with the patient. If applicable, a reminder letter will be sent to the patient regarding the  next appointment. BI-RADS CATEGORY  3: Probably benign. Electronically Signed   By: Rosaline Collet M.D.   On: 02/24/2023 12:34   US  LIMITED ULTRASOUND INCLUDING AXILLA RIGHT BREAST Result Date: 02/24/2023 CLINICAL DATA:  Screening recall for possible right breast masses. The patient has history of left breast cancer status post lumpectomy in 2019. EXAM: DIGITAL DIAGNOSTIC UNILATERAL RIGHT MAMMOGRAM WITH TOMOSYNTHESIS AND CAD; ULTRASOUND RIGHT BREAST LIMITED TECHNIQUE: Right digital diagnostic mammography and breast tomosynthesis was performed. The images were evaluated with computer-aided detection. ; Targeted ultrasound examination of the right breast was performed COMPARISON:  Previous exam(s). ACR Breast Density Category b: There are scattered areas of fibroglandular density. FINDINGS: Spot compression tomosynthesis images of the right breast demonstrates 2 adjacent masses in the inferior to lower inner right breast. The more anterior mass measures approximately 1.0 cm, and the more posterior measures 0.8 cm. In the lateral to upper outer periareolar right breast, there is a small lobulated oval circumscribed mass measuring 0.6 cm. Ultrasound of the right breast at 5 o'clock, 2 cm from the nipple demonstrates a lobulated anechoic circumscribed mass measuring 1.0 x 0.5 x 0.9 cm. At 6 o'clock, 3 cm from the nipple there is a near anechoic oval circumscribed mass measuring 0.7 x 0.3 x 0.6 cm. In the right breast at 9:30, 4 cm from the nipple there is a oval mass with anechoic spaces measuring 0.6 x 0.3 x 0.3 cm. Ultrasound of the right axilla demonstrates multiple normal-appearing lymph nodes. IMPRESSION: 1. There are 2 likely benign masses in the right breast at 6 o'clock and at 9:30, most likely representing mildly complicated cysts. 2.  There is a benign cyst in the right breast at 5 o'clock. RECOMMENDATION: Six-month follow-up right breast ultrasound for the 2 right breast  masses at 6 o'clock and 930. I  have discussed the findings and recommendations with the patient. If applicable, a reminder letter will be sent to the patient regarding the next appointment. BI-RADS CATEGORY  3: Probably benign. Electronically Signed   By: Rosaline Collet M.D.   On: 02/24/2023 12:34     ELIGIBLE FOR AVAILABLE RESEARCH PROTOCOL: No   ASSESSMENT:   68 y.o. [place], [place] woman status post left breast upper outer quadrant and left axillary lymph node biopsy 08/07/2017, both positive for a T2 N1, stage IIB invasive ductal carcinoma, grade 3, estrogen receptor weakly positive, progesterone receptor negative, but HER-2 amplified, with an MIB-1 of 80%  (a) staging chest CT and bone scan 08/30/2017 showed no evidence of metastatic disease  (1) neoadjuvant chemotherapy will consist of carboplatin , docetaxel , trastuzumab , and Pertuzumab  given every 21 days x 6 starting 09/07/2017, perjeta  omitted with cycle 2 and 3 due to diarrhea  (a) Gemcitabine  substituted for Docetaxel  beginning with cycle 5 and 6 for concerns of neuropathy  (2) trastuzumab  continued to complete 6 months, last dose 04/06/2018  (a) echocardiogram 08/28/2017 showed an ejection fraction in the 60-65% range.  (b) echocardiogram on 11/29/2017 showed an EF of 55-60%  (c) echocardiogram 03/07/2018 showed an ejection fraction in the 55-60% range  (3) Left lumpectomy on 01/22/2018 shows a ypT1b pN1a, grade 3 residual invasive ductal carcinoma, agnostic panel HER-2 negative, ER 40% weakly positive, PR negative.   (a) foundation one testing requested on 02/02/2018  (b) total of 11 axillary lymph nodes removed (1+)  (4) adjuvant radiation completed 04/20/2018  (5) started anastrozole  05/30/2018  (a) bone density 10/25/2016 normal with a T score of -0.7  (b) bone density 11/28/2019 shows a T score of -1.5  (6) status post total thyroidectomy 09/20/2019 for papillary thyroid  carcinoma, pT1b pNX, with negative margins   PLAN:  Continue anastrozole   until April 2025, we have discussed previously and today that she can finish 5 years of antiestrogen therapy followed by surveillance.  She is okay with this plan she is tolerating it well.    With regards to the recent mammogram and ultrasound which shows complicated cyst in the right breast, she is very worried about having to wait for a whole 38-month.  Before getting another ultrasound.  She wondered if I can talk to the radiologist to discuss a degree of suspicion and if she would benefit from a biopsy sooner.  Have sent a message to the radiologist.  I will follow-up on this once I get any more information.  I have also ordered a 30-month ultrasound in case recommendation is to continue monitoring.  Total time : 12 min  I connected with  Madison Hopkins on 03/08/23 by a telephone application and verified that I am speaking with the correct person using two identifiers.   I discussed the limitations of evaluation and management by telemedicine. The patient expressed understanding and agreed to proceed.   *Total Encounter Time as defined by the Centers for Medicare and Medicaid Services includes, in addition to the face-to-face time of a patient visit (documented in the note above) non-face-to-face time: obtaining and reviewing outside history, ordering and reviewing medications, tests or procedures, care coordination (communications with other health care professionals or caregivers) and documentation in the medical record.

## 2023-03-09 ENCOUNTER — Other Ambulatory Visit: Payer: Self-pay | Admitting: Hematology and Oncology

## 2023-03-09 DIAGNOSIS — C50412 Malignant neoplasm of upper-outer quadrant of left female breast: Secondary | ICD-10-CM

## 2023-03-14 ENCOUNTER — Ambulatory Visit
Admission: EM | Admit: 2023-03-14 | Discharge: 2023-03-14 | Disposition: A | Discharge status: Home / Self Care | Payer: PPO | Attending: Emergency Medicine | Admitting: Emergency Medicine

## 2023-03-14 DIAGNOSIS — R051 Acute cough: Secondary | ICD-10-CM

## 2023-03-14 MED ORDER — BENZONATATE 100 MG PO CAPS
100.0000 mg | ORAL_CAPSULE | Freq: Three times a day (TID) | ORAL | 0 refills | Status: DC
Start: 2023-03-14 — End: 2023-12-25

## 2023-03-14 NOTE — ED Triage Notes (Signed)
 Recently seen on 03-06-2023 for:  Acute non-recurrent pansinusitis  Acute lower respiratory infection   Current Concerns: still dealing with the Cough and my chest still feel's like it is full of mucous, the cough syrup isn't helping a lot. No sob. No wheezing. No fever.

## 2023-03-14 NOTE — ED Provider Notes (Signed)
 [facility]    CSN: [id number] Arrival date & time: 03/14/23  1153      History   Chief Complaint Chief Complaint  Patient presents with   Follow-up    HPI Madison Hopkins is a 68 y.o. female.   68 year old female, Madison Hopkins, presents to urgent care for evaluation of cough.  Patient states she was recently seen on 03/06/23 and diagnosed with pansinusitis acute lower respiratory infection.  Patient is still on antibiotics and has cough medicine but wants to make sure she does not need a refill.  The history is provided by the patient. No language interpreter was used.    Past Medical History:  Diagnosis Date   Abnormal Pap smear of vagina 07/28/2016   LGSIL; colpo 07/2016 atrophic squamous cells; colpo 07/2017 - atypia   Allergy    Arthritis    Breast cancer (HCC) 2018   Metastatic Left breast   Elevated hemoglobin A1c 07/28/2016   level - 6.1   Endometriosis    GERD (gastroesophageal reflux disease)    HSV-2 infection    Iron deficiency anemia    Low vitamin D  level 07/28/2016   level 24.7   Osteoporosis 2024   forearm   Personal history of chemotherapy    finished 10/19   Personal history of radiation therapy    finished 03/2018   Thyroid  cancer (HCC)    Thyroid  neoplasm     Patient Active Problem List   Diagnosis Date Noted   Acute cough 03/14/2023   Genitourinary syndrome of menopause 12/23/2022   Irritable bowel syndrome with both constipation and diarrhea 12/23/2022   Fecal smearing 12/23/2022   H/O papillary adenocarcinoma of thyroid  11/17/2022   Acute non-recurrent frontal sinusitis 10/14/2022   Atrophic vaginitis 03/19/2022   History of thyroid  cancer 09/15/2020   Papillary thyroid  carcinoma (HCC) 04/07/2020   Post-operative hypothyroidism 10/02/2019   S/P total thyroidectomy 10/02/2019   Neoplasm of uncertain behavior of thyroid  gland 09/16/2019   Multiple thyroid  nodules 09/16/2019   COVID-19 virus infection 02/20/2019    Environmental allergies 01/12/2018   Malignant tumor of breast (HCC) 01/12/2018   Malignant neoplastic disease (HCC) 01/12/2018   Port-A-Cath in place 11/09/2017   Malignant neoplasm of upper-outer quadrant of left breast in female, estrogen receptor positive (HCC) 08/10/2017    Past Surgical History:  Procedure Laterality Date   ABDOMINAL HYSTERECTOMY     BREAST BIOPSY Left 08/09/2017   BREAST LUMPECTOMY Left 01/22/2018   BREAST LUMPECTOMY WITH RADIOACTIVE SEED AND AXILLARY LYMPH NODE DISSECTION Left 01/22/2018   Procedure: LEFT BREAST LUMPECTOMY WITH RADIOACTIVE SEED AND COMPLETE LEFT AXILLARY LYMPH NODE DISSECTION;  Surgeon: Gail Favorite, MD;  Location: [facility];  Service: General;  Laterality: Left;   CESAREAN SECTION     COLON SURGERY     COLOSTOMY     COLOSTOMY REVERSAL     FOOT SURGERY Right    done spur   OOPHORECTOMY     PORT-A-CATH REMOVAL Right 08/28/2018   Procedure: REMOVAL PORT-A-CATH;  Surgeon: Gail Favorite, MD;  Location: [facility];  Service: General;  Laterality: Right;   PORTACATH PLACEMENT N/A 09/06/2017   Procedure: INSERTION PORT-A-CATH;  Surgeon: Ethyl Lenis, MD;  Location: [facility];  Service: General;  Laterality: N/A;   SMALL INTESTINE SURGERY     SPINE SURGERY     Injections   THYROIDECTOMY N/A 09/20/2019   Procedure: TOTAL THYROIDECTOMY;  Surgeon: Eletha Boas, MD;  Location: [facility];  Service: General;  Laterality: N/A;   TUBAL LIGATION  1995    OB History     Gravida  2   Para  2   Term  0   Preterm  0   AB  0   Living  2      SAB  0   IAB  0   Ectopic  0   Multiple  0   Live Births  0            Home Medications    Prior to Admission medications   Medication Sig Start Date End Date Taking? Authorizing Provider  anastrozole  (ARIMIDEX ) 1 MG tablet Take 1 tablet (1 mg total) by mouth daily. 01/26/22  Yes Iruku, Praveena, MD  benzonatate  (TESSALON ) 100 MG capsule Take 1 capsule (100 mg  total) by mouth every 8 (eight) hours. 03/14/23  Yes Tais Koestner, Rilla, NP  doxycycline  (VIBRAMYCIN ) 100 MG capsule Take 1 capsule (100 mg total) by mouth 2 (two) times daily. 03/06/23  Yes Arloa Suzen RAMAN, NP  levothyroxine  (SYNTHROID ) 100 MCG tablet Take 1 tablet (100 mcg total) by mouth daily. 11/17/22  Yes Shamleffer, Ibtehal Jaralla, MD  promethazine -dextromethorphan (PROMETHAZINE -DM) 6.25-15 MG/5ML syrup Take 5 mLs by mouth 3 (three) times daily as needed for cough. 03/06/23  Yes Arloa Suzen RAMAN, NP  acyclovir  (ZOVIRAX ) 400 MG tablet Take 1 tablet (400 mg total) by mouth 2 (two) times daily. 11/02/22   Job Lukes, PA  Artificial Tear Solution (OPTI-TEARS OP) Place 1 drop into both eyes 3 (three) times daily as needed (for dry/irritated eyes.).    [provider]  Ascorbic Acid (VITAMIN C) 100 MG CHEW Chew 100 mg by mouth daily.     [provider]  BORIC ACID EX Apply topically.     [provider]  fluconazole  (DIFLUCAN ) 150 MG tablet Take 1 tablet (150 mg total) by mouth every three (3) days as needed (yeast related to antibiotic therapy). Repeat if needed 03/06/23   Arloa Suzen RAMAN, NP  fluticasone  (FLONASE ) 50 MCG/ACT nasal spray SPRAY 2 SPRAYS INTO EACH NOSTRIL EVERY DAY 06/07/22   Job Lukes, PA  ibuprofen  (ADVIL ) 800 MG tablet Take 800 mg by mouth 3 (three) times daily as needed. Patient not taking: Reported on 03/06/2023 10/02/20   [provider]  ipratropium (ATROVENT ) 0.03 % nasal spray Place 2 sprays into both nostrils every 12 (twelve) hours. 10/14/22   Sebastian Beverley NOVAK, MD  lidocaine  (XYLOCAINE ) 5 % ointment Apply 1 Application topically 3 (three) times daily. Uses as needed. 02/28/23   Amundson C Silva, Brook E, MD  Multiple Vitamins-Minerals (VITAMIN D3 COMPLETE PO) Take 1 tablet by mouth daily.     [provider]  NONFORMULARY OR COMPOUNDED ITEM Hydrocortisone  compounded vaginal cream.  Place 3 grams per vagina at bedtime for 4  weeks.  Disp:  84 grams, RF  none.  [facility]. 01/12/23   Amundson C Silva, Brook E, MD  Probiotic Product (ALIGN PO) Take by mouth.     [provider]  prochlorperazine  (COMPAZINE ) 10 MG tablet Take 1 tablet (10 mg total) by mouth every 6 (six) hours as needed (Nausea or vomiting). 11/09/17 02/01/18  Crawford Morna Pickle, NP    Family History Family History  Problem Relation Age of Onset   Mental illness Mother    Alcoholism Mother    Cirrhosis Mother    Alcohol abuse Mother    Cancer Father    Lung cancer Father  Social History Social History   Tobacco Use   Smoking status: Never   Smokeless tobacco: Never  Vaping Use   Vaping status: Never Used  Substance Use Topics   Alcohol use: Yes    Alcohol/week: 1.0 standard drink of alcohol    Types: 1 Glasses of wine per week    Comment: Social   Drug use: Not Currently    Types: Marijuana    Comment: everyday     Allergies   Penicillins, Bee venom, and Tramadol    Review of Systems Review of Systems  HENT:  Positive for congestion.   Respiratory:  Positive for cough. Negative for shortness of breath and wheezing.   Cardiovascular:  Negative for chest pain and palpitations.  All other systems reviewed and are negative.    Physical Exam Triage Vital Signs ED Triage Vitals  Encounter Vitals Group     BP 03/14/23 1331 120/72     Systolic BP Percentile --      Diastolic BP Percentile --      Pulse Rate 03/14/23 1331 64     Resp 03/14/23 1331 18     Temp 03/14/23 1331 98.5 F (36.9 C)     Temp Source 03/14/23 1331 Oral     SpO2 03/14/23 1331 98 %     Weight 03/14/23 1328 199 lb 1.2 oz (90.3 kg)     Height 03/14/23 1328 5' 8.5 (1.74 m)     Head Circumference --      Peak Flow --      Pain Score 03/14/23 1327 0     Pain Loc --      Pain Education --      Exclude from Growth Chart --    No data found.  Updated Vital Signs BP 120/72 (BP Location: Left Arm)   Pulse 64   Temp 98.5 F  (36.9 C) (Oral)   Resp 18   Ht 5' 8.5 (1.74 m)   Wt 199 lb 1.2 oz (90.3 kg)   LMP  (LMP Unknown)   SpO2 98%   BMI 29.83 kg/m   Visual Acuity Right Eye Distance:   Left Eye Distance:   Bilateral Distance:    Right Eye Near:   Left Eye Near:    Bilateral Near:     Physical Exam Vitals and nursing note reviewed.  Constitutional:      General: She is not in acute distress.    Appearance: She is well-developed and well-groomed.  HENT:     Head: Normocephalic.     Right Ear: Tympanic membrane is retracted.     Left Ear: Tympanic membrane is retracted.     Nose: Congestion present.     Mouth/Throat:     Lips: Pink.     Mouth: Mucous membranes are moist.     Pharynx: Oropharynx is clear.  Eyes:     General: Lids are normal.     Conjunctiva/sclera: Conjunctivae normal.     Pupils: Pupils are equal, round, and reactive to light.  Neck:     Trachea: No tracheal deviation.  Cardiovascular:     Rate and Rhythm: Normal rate and regular rhythm.     Pulses: Normal pulses.     Heart sounds: Normal heart sounds. No murmur heard. Pulmonary:     Effort: Pulmonary effort is normal.     Breath sounds: Normal breath sounds and air entry.  Abdominal:     General: Bowel sounds are normal.     Palpations: Abdomen  is soft.     Tenderness: There is no abdominal tenderness.  Musculoskeletal:        General: Normal range of motion.     Cervical back: Normal range of motion.  Lymphadenopathy:     Cervical: No cervical adenopathy.  Skin:    General: Skin is warm and dry.     Findings: No rash.  Neurological:     General: No focal deficit present.     Mental Status: She is alert and oriented to person, place, and time.     GCS: GCS eye subscore is 4. GCS verbal subscore is 5. GCS motor subscore is 6.  Psychiatric:        Speech: Speech normal.        Behavior: Behavior normal. Behavior is cooperative.      UC Treatments / Results  Labs (all labs ordered are listed, but only  abnormal results are displayed) Labs Reviewed - No data to display  EKG   Radiology No results found.  Procedures Procedures (including critical care time)  Medications Ordered in UC Medications - No data to display  Initial Impression / Assessment and Plan / UC Course  I have reviewed the triage vital signs and the nursing notes.  Pertinent labs & imaging results that were available during my care of the patient were reviewed by me and considered in my medical decision making (see chart for details).    Discussed exam findings and plan of care with patient, tessalon  perles scripted, strict go to ER precautions given.   Patient verbalized understanding to this provider.  Ddx: Acute cough, URI Final Clinical Impressions(s) / UC Diagnoses   Final diagnoses:  Acute cough     Discharge Instructions      Rest, push fluids, take Tessalon  as directed, finish doxycycline .  Follow-up with PCP, return as needed.     ED Prescriptions     Medication Sig Dispense Auth. Provider   benzonatate  (TESSALON ) 100 MG capsule Take 1 capsule (100 mg total) by mouth every 8 (eight) hours. 21 capsule Elli Groesbeck, NP      PDMP not reviewed this encounter.   Aminta Loose, NP 03/14/23 2032

## 2023-03-14 NOTE — Discharge Instructions (Addendum)
 Rest, push fluids, take Tessalon as directed, finish doxycycline.  Follow-up with PCP, return as needed.

## 2023-03-16 DIAGNOSIS — M5412 Radiculopathy, cervical region: Secondary | ICD-10-CM | POA: Diagnosis not present

## 2023-03-20 ENCOUNTER — Ambulatory Visit: Payer: Self-pay | Admitting: Physician Assistant

## 2023-03-20 ENCOUNTER — Telehealth: Payer: PPO | Admitting: Nurse Practitioner

## 2023-03-20 DIAGNOSIS — R3989 Other symptoms and signs involving the genitourinary system: Secondary | ICD-10-CM

## 2023-03-20 MED ORDER — SULFAMETHOXAZOLE-TRIMETHOPRIM 800-160 MG PO TABS: 1.0000 | ORAL_TABLET | Freq: Two times a day (BID) | ORAL | 0 refills | Status: AC

## 2023-03-20 NOTE — Progress Notes (Signed)
Virtual Visit Consent   [name], you are scheduled for a virtual visit with a [facility] provider today. Just as with appointments in the office, your consent must be obtained to participate. Your consent will be active for this visit and any virtual visit you may have with one of our providers in the next 365 days. If you have a MyChart account, a copy of this consent can be sent to you electronically.  As this is a virtual visit, video technology does not allow for your provider to perform a traditional examination. This may limit your provider's ability to fully assess your condition. If your provider identifies any concerns that need to be evaluated in person or the need to arrange testing (such as labs, EKG, etc.), we will make arrangements to do so. Although advances in technology are sophisticated, we cannot ensure that it will always work on either your end or our end. If the connection with a video visit is poor, the visit may have to be switched to a telephone visit. With either a video or telephone visit, we are not always able to ensure that we have a secure connection.  By engaging in this virtual visit, you consent to the provision of healthcare and authorize for your insurance to be billed (if applicable) for the services provided during this visit. Depending on your insurance coverage, you may receive a charge related to this service.  I need to obtain your verbal consent now. Are you willing to proceed with your visit today? [facility] has provided verbal consent on 03/20/2023 for a virtual visit (video or telephone). Viviano Simas, FNP  Date: 03/20/2023 5:19 PM  Virtual Visit via Video Note   I, Viviano Simas, connected with  Madison Hopkins  ([id number], 1956/01/13) on 03/20/23 at  5:30 PM EST by a video-enabled telemedicine application and verified that I am speaking with the correct person using two identifiers.  Location: Patient: Virtual Visit Location Patient:  Home Provider: Virtual Visit Location Provider: Home Office   I discussed the limitations of evaluation and management by telemedicine and the availability of in person appointments. The patient expressed understanding and agreed to proceed.    History of Present Illness: Madison Hopkins is a 68 y.o. who identifies as a female who was assigned female at birth, and is being seen today for new onset urinary symptoms   She has been seen in UC recently two times  First on 03/06/23 with complaints of cough and sinus infection  Was given doxycyline at that time.  [facility]/14/23 with ongoing cough - instructed to continue doxycycline and was also given benzonatate   She finished her antibiotics 03/17/23 and is feeling much better from the URI symptoms   On 03/17/23 she started to have some peritoneal irritation - denies itching  Also has urgency to urinate and increased frequency   She has been drinking cranberry juice and increased her fluids without relief   She denies pain but says it is "irritable"  Denies any vaginal discharge or symptoms of a yeast infection   She feels it is more similar to UTIs she has had in the past      Problems:  Patient Active Problem List   Diagnosis Date Noted   Acute cough 03/14/2023   Genitourinary syndrome of menopause 12/23/2022   Irritable bowel syndrome with both constipation and diarrhea 12/23/2022   Fecal smearing 12/23/2022   H/O papillary adenocarcinoma of thyroid 11/17/2022  Acute non-recurrent frontal sinusitis 10/14/2022   Atrophic vaginitis 03/19/2022   History of thyroid cancer 09/15/2020   Papillary thyroid carcinoma (HCC) 04/07/2020   Post-operative hypothyroidism 10/02/2019   S/P total thyroidectomy 10/02/2019   Neoplasm of uncertain behavior of thyroid gland 09/16/2019   Multiple thyroid nodules 09/16/2019   COVID-19 virus infection 02/20/2019   Environmental allergies 01/12/2018   Malignant tumor of breast (HCC)  01/12/2018   Malignant neoplastic disease (HCC) 01/12/2018   Port-A-Cath in place 11/09/2017   Malignant neoplasm of upper-outer quadrant of left breast in female, estrogen receptor positive (HCC) 08/10/2017    Allergies:  Allergies  Allergen Reactions   Penicillins Nausea Only, Hives and Nausea And Vomiting    Has patient had a PCN reaction causing immediate rash, facial/tongue/throat swelling, SOB or lightheadedness with hypotension: No Has patient had a PCN reaction causing severe rash involving mucus membranes or skin necrosis: No Has patient had a PCN reaction that required hospitalization: No Has patient had a PCN reaction occurring within the last 10 years: Yes--nausea & headache ONLY If all of the above answers are "NO", then may proceed with Cephalosporin use.  Has patient had a PCN reaction causing immediate rash, facial/tongue/throat swelling, SOB or lightheadedness with hypotension: No Has patient had a PCN reaction causing severe rash involving mucus membranes or skin necrosis: No Has patient had a PCN reaction that required hospitalization: No Has patient had a PCN reaction occurring within the last 10 years: Yes--nausea & headache ONLY If all of the above answers are "NO", then may proceed with Cephalosporin use. Has patient had a PCN reaction causing immediate rash, facial/tongue/throat swelling, SOB or lightheadedness with hypotension: No Has patient had a PCN reaction causing severe rash involving mucus membranes or skin necrosis: No Has patient had a PCN reaction that required hospitalization: No Has patient had a PCN reaction occurring within the last 10 years: Yes--nausea & headache ONLY If all of the above answers are "NO", then may proceed with Cephalosporin use.   Bee Venom    Tramadol Nausea Only   Medications:  Current Outpatient Medications:    acyclovir (ZOVIRAX) 400 MG tablet, Take 1 tablet (400 mg total) by mouth 2 (two) times daily., Disp: 180 tablet, Rfl:  1   anastrozole (ARIMIDEX) 1 MG tablet, Take 1 tablet (1 mg total) by mouth daily., Disp: 90 tablet, Rfl: 4   Artificial Tear Solution (OPTI-TEARS OP), Place 1 drop into both eyes 3 (three) times daily as needed (for dry/irritated eyes.)., Disp: , Rfl:    Ascorbic Acid (VITAMIN C) 100 MG CHEW, Chew 100 mg by mouth daily. , Disp: , Rfl:    benzonatate (TESSALON) 100 MG capsule, Take 1 capsule (100 mg total) by mouth every 8 (eight) hours., Disp: 21 capsule, Rfl: 0   BORIC ACID EX, Apply topically. , Disp: , Rfl:    doxycycline (VIBRAMYCIN) 100 MG capsule, Take 1 capsule (100 mg total) by mouth 2 (two) times daily., Disp: 20 capsule, Rfl: 0   fluconazole (DIFLUCAN) 150 MG tablet, Take 1 tablet (150 mg total) by mouth every three (3) days as needed (yeast related to antibiotic therapy). Repeat if needed, Disp: 2 tablet, Rfl: 0   fluticasone (FLONASE) 50 MCG/ACT nasal spray, SPRAY 2 SPRAYS INTO EACH NOSTRIL EVERY DAY, Disp: 16 mL, Rfl: 2   ibuprofen (ADVIL) 800 MG tablet, Take 800 mg by mouth 3 (three) times daily as needed. (Patient not taking: Reported on 03/06/2023), Disp: , Rfl:    ipratropium (ATROVENT)  0.03 % nasal spray, Place 2 sprays into both nostrils every 12 (twelve) hours., Disp: 30 mL, Rfl: 12   levothyroxine (SYNTHROID) 100 MCG tablet, Take 1 tablet (100 mcg total) by mouth daily., Disp: 90 tablet, Rfl: 3   lidocaine (XYLOCAINE) 5 % ointment, Apply 1 Application topically 3 (three) times daily. Uses as needed., Disp: 1.25 g, Rfl: 0   Multiple Vitamins-Minerals (VITAMIN D3 COMPLETE PO), Take 1 tablet by mouth daily. , Disp: , Rfl:    NONFORMULARY OR COMPOUNDED ITEM, Hydrocortisone compounded vaginal cream.  Place 3 grams per vagina at bedtime for 4 weeks.  Disp:  84 grams, RF  none.  [facility] Disp: 84 each, Rfl: 0   Probiotic Product (ALIGN PO), Take by mouth. , Disp: , Rfl:    promethazine-dextromethorphan (PROMETHAZINE-DM) 6.25-15 MG/5ML syrup, Take 5 mLs by mouth 3 (three)  times daily as needed for cough., Disp: 180 mL, Rfl: 0  Observations/Objective: Patient is well-developed, well-nourished in no acute distress.  Resting comfortably  at home.  Head is normocephalic, atraumatic.  No labored breathing. Speech is clear and coherent with logical content.  Patient is alert and oriented at baseline.    Assessment and Plan:  1. Suspected UTI (Primary)  Patient will follow up with PCP as discussed   - sulfamethoxazole-trimethoprim (BACTRIM DS) 800-160 MG tablet; Take 1 tablet by mouth 2 (two) times daily for 5 days.  Dispense: 10 tablet; Refill: 0     Follow Up Instructions: I discussed the assessment and treatment plan with the patient. The patient was provided an opportunity to ask questions and all were answered. The patient agreed with the plan and demonstrated an understanding of the instructions.  A copy of instructions were sent to the patient via MyChart unless otherwise noted below.    The patient was advised to call back or seek an in-person evaluation if the symptoms worsen or if the condition fails to improve as anticipated.    Viviano Simas, FNP

## 2023-03-20 NOTE — Telephone Encounter (Signed)
Copied from CRM [id number]. Topic: Clinical - Medical Advice >> Mar 20, 2023  2:10 PM Adele Barthel wrote: Reason for CRM: Patient is experiencing symptoms of possible UTI or yeast infection. She was recently seen for a respiratory infection and was prescribed a 10 day course of doxycycline. At a follow up she was prescribed additional dosages, with the last pill taken on Friday. Having stomach pain. Has been drinking cranberry juice and taking AZO, not beneficial. Her outer vaginal area has been very irritated the last few days and she is very uncomfortable. No discharge or itchiness. Has history of breast cancer, so is on estrogen therapy. Is fatigued and Tylenol is not relieving pain. Would like to know if provider would be able to prescribe fluconazole (DIFLUCAN) 150 MG tablet for relief and call into her pharmacy, was unable to get scheduled in the next 2 days, but said she would be willing to do virtual appointment if needed. CB# [phone]

## 2023-03-20 NOTE — Telephone Encounter (Signed)
Please call pt and schedule an appt with an open provider.

## 2023-03-20 NOTE — Telephone Encounter (Signed)
  Chief Complaint: urinary symptoms Symptoms: increased frequency, burning with urination Frequency: 4 days Pertinent Negatives: Patient denies fever, flank pain Disposition: [] ED /[x] Urgent Care (no appt availability in office) / [] Appointment(In office/virtual)/ []  [facility]/ [] Home Care/ [] Refused Recommended Disposition /[] [facility]/ []  Follow-up with PCP Additional Notes: Patient called reporting urinary symptoms of burning with urination, increased frequency, pain x 4 days. States she just finished antibiotics on 1/16 and did use diflucan twice during that treatment, but symptoms have returned. Denies fever, flank pain. Per protocol, patient to be evaluated within 4 hours, patient requests video visit with any provider today. Scheduled for video visit at 1730. Care advice reviewed with patient, understanding verbalized. Denies further questions at this time. Alerting PCP for review.    Reason for Disposition  [1] SEVERE pain with urination (e.g., excruciating) AND [2] not improved after 2 hours of pain medicine and Sitz bath  Answer Assessment - Initial Assessment Questions 1. SEVERITY: "How bad is the pain?"  (e.g., Scale 1-10; mild, moderate, or severe)   - MILD (1-3): complains slightly about urination hurting   - MODERATE (4-7): interferes with normal activities     - SEVERE (8-10): excruciating, unwilling or unable to urinate because of the pain      8/10 2. FREQUENCY: "How many times have you had painful urination today?"      Yes 3. PATTERN: "Is pain present every time you urinate or just sometimes?"      Every time 4. ONSET: "When did the painful urination start?"      4 days ago 5. FEVER: "Do you have a fever?" If Yes, ask: "What is your temperature, how was it measured, and when did it start?"     Denies 6. PAST UTI: "Have you had a urine infection before?" If Yes, ask: "When was the last time?" and "What happened that time?"      Frequently 7.  CAUSE: "What do you think is causing the painful urination?"  (e.g., UTI, scratch, Herpes sore)     UTI 8. OTHER SYMPTOMS: "Do you have any other symptoms?" (e.g., blood in urine, flank pain, genital sores, urgency, vaginal discharge)     Increased frequency  Protocols used: Urination Pain - Female-A-AH

## 2023-03-20 NOTE — Telephone Encounter (Signed)
LVM to schedule ov with an available provider.

## 2023-03-20 NOTE — Telephone Encounter (Signed)
Please see CRM triage note for patient

## 2023-03-21 ENCOUNTER — Telehealth: Payer: Self-pay

## 2023-03-21 DIAGNOSIS — N898 Other specified noninflammatory disorders of vagina: Secondary | ICD-10-CM

## 2023-03-21 MED ORDER — LIDOCAINE 5 % EX OINT: 1.0000 | TOPICAL_OINTMENT | Freq: Three times a day (TID) | CUTANEOUS | 1 refills | Status: DC

## 2023-03-21 NOTE — Telephone Encounter (Signed)
Call returned to patient.  Patient does still want PA for lidocaine ointment.   Reviewed option for GoodRx Coupon if not covered with PA. Will provide update once PA response received. Patient appreciative of call.   Dr. Edward Jolly -can you confirm dispensing amount?   Cc: Warren Lacy.

## 2023-03-21 NOTE — Telephone Encounter (Signed)
LVMTCB. Need to confirm if pt is currently needing to use Lidocaine ointment. Information was in provider's box not reviewed for extended time. PA will be reinstated with new diagnosis if this is the case

## 2023-03-21 NOTE — Telephone Encounter (Signed)
New Rx sent. Patient notified. Patient will have pharmacy notify office if new PA is required.   Patient verbalizes understanding and is agreeable.

## 2023-03-21 NOTE — Telephone Encounter (Signed)
I would recommend a 30 gram tube and 1 refill.

## 2023-03-22 ENCOUNTER — Telehealth: Payer: Self-pay

## 2023-03-22 NOTE — Telephone Encounter (Signed)
PA denied for Lidocaine Ointment a second time via CoverMyMeds. Appeal needs to be started at [phone] or alternative provided by provider.

## 2023-03-22 NOTE — Telephone Encounter (Signed)
Please let patient know that she can purchase lidocaine cream over the counter without a prescription.   The lidocaine jelly or ointment is not the only formulation available.   These medications can be used interchangeably.

## 2023-04-07 ENCOUNTER — Other Ambulatory Visit: Payer: Self-pay | Admitting: Hematology and Oncology

## 2023-04-18 DIAGNOSIS — M5412 Radiculopathy, cervical region: Secondary | ICD-10-CM | POA: Diagnosis not present

## 2023-04-24 ENCOUNTER — Telehealth: Payer: Self-pay | Admitting: *Deleted

## 2023-04-24 ENCOUNTER — Other Ambulatory Visit: Payer: Self-pay | Admitting: *Deleted

## 2023-04-24 DIAGNOSIS — N6310 Unspecified lump in the right breast, unspecified quadrant: Secondary | ICD-10-CM

## 2023-04-24 NOTE — Telephone Encounter (Signed)
 This RN spoke with pt per her VM stating concern that the biopsy has not been scheduled per prior discussion "[facility] and [facility] follow up for June".  This RN reviewed prior discussion and noted goal was to obtain biopsy or aspiration of cyst for evaluation.  This RN informed pt unsure why not scheduled - this RN placed a new order with date of expectation for next week.  Above discussed with pt - as well for pt to call this RN if she does not hear from the [facility].  This note will be forwarded to MD for review of communication- no further needs at this time.

## 2023-05-17 ENCOUNTER — Ambulatory Visit: Payer: PPO | Admitting: Internal Medicine

## 2023-05-17 ENCOUNTER — Encounter: Payer: Self-pay | Admitting: Internal Medicine

## 2023-05-17 VITALS — BP 122/74 | HR 64 | Ht 68.5 in | Wt 200.8 lb

## 2023-05-17 DIAGNOSIS — E890 Postprocedural hypothyroidism: Secondary | ICD-10-CM

## 2023-05-17 DIAGNOSIS — Z85850 Personal history of malignant neoplasm of thyroid: Secondary | ICD-10-CM | POA: Diagnosis not present

## 2023-05-17 NOTE — Progress Notes (Unsigned)
 Name: Madison Hopkins  MRN/ DOB: [id number], 17-Feb-1956    Age/ Sex: 68 y.o., female     PCP: Jarold Motto, PA   Reason for Endocrinology Evaluation: [facility]      Initial Endocrinology Clinic Visit: 10/02/2019    PATIENT IDENTIFIER: Madison Hopkins is a 68 y.o., female with a past medical history of Breast Ca and PTC . She has followed with [facility] since 10/02/2019 for consultative assistance with management of her PTC.   HISTORICAL SUMMARY:   Pt has been referred by Dr. Darnell Level.  She had an incidental finding of a left thyroid nodule ~1.5 cm in diameter on neck MRI during evaluation of chronic neck pain.  This nodule was noted to enlarge to 2.3 cm max diameter on cervical spine MRI 04/2019.   She is S/P FNA in 06/2019 - with left mid pole cytology report of Suspicious for malignancy (Bethesda category V), no afirma found for this in the chart. And a left inferior pole cytology of Atypia of undetermined significance (Bethesda category III) , with benign afirma     She is S/P Total thyroidectomy  On 7/23rd/ 2021 with a 1.3 cm papillary thyroid carcinoma on the left with no invasion, free margins and NO L.N submitted.   Thyroid ultrasound showed a subcentimeter hypoechoic right thyroid bed focus 0.8 cm by by March 2023 the patient developed a new 0.7 left thyroid bed focus    She is s/p RAI remnant ablation with 53 mCi 07/2021   Posttreatment scan showed right thyroid remnant but no distant metastasis on 08/27/2021   Thyroglobulin remains low at 0.1 NG/mL with undetectable thyroglobulin antibodies  SUBJECTIVE:    Today (05/17/2023):  Madison Hopkins is here for total thyroidectomy secondary to PTC.   She continues to follow-up with oncology for history of breast cancer  Weight stable  Denies local neck swelling  Denies palpitations  Denies tremors  Has occasional constipation and  diarrhea  NO biotin  She has sleeves for LUE lymphedema , completed  PT  Levothyroxine 100 mcg daily    HISTORY:  Past Medical History:  Past Medical History:  Diagnosis Date   Abnormal Pap smear of vagina 07/28/2016   LGSIL; colpo 07/2016 atrophic squamous cells; colpo 07/2017 - atypia   Allergy    Arthritis    Breast cancer (HCC) 2018   Metastatic Left breast   Elevated hemoglobin A1c 07/28/2016   level - 6.1   Endometriosis    GERD (gastroesophageal reflux disease)    HSV-2 infection    Iron deficiency anemia    Low vitamin D level 07/28/2016   level 24.7   Osteoporosis 2024   forearm   Personal history of chemotherapy    finished 10/19   Personal history of radiation therapy    finished 03/2018   Thyroid cancer (HCC)    Thyroid neoplasm    Past Surgical History:  Past Surgical History:  Procedure Laterality Date   ABDOMINAL HYSTERECTOMY     BREAST BIOPSY Left 08/09/2017   BREAST LUMPECTOMY Left 01/22/2018   BREAST LUMPECTOMY WITH RADIOACTIVE SEED AND AXILLARY LYMPH NODE DISSECTION Left 01/22/2018   Procedure: LEFT BREAST LUMPECTOMY WITH RADIOACTIVE SEED AND COMPLETE LEFT AXILLARY LYMPH NODE DISSECTION;  Surgeon: Claud Kelp, MD;  Location: [facility];  Service: General;  Laterality: Left;   CESAREAN SECTION     COLON SURGERY     COLOSTOMY     COLOSTOMY REVERSAL     FOOT  SURGERY Right    done spur   OOPHORECTOMY     PORT-A-CATH REMOVAL Right 08/28/2018   Procedure: REMOVAL PORT-A-CATH;  Surgeon: Claud Kelp, MD;  Location: [facility];  Service: General;  Laterality: Right;   PORTACATH PLACEMENT N/A 09/06/2017   Procedure: INSERTION PORT-A-CATH;  Surgeon: Ovidio Kin, MD;  Location: [facility] OR;  Service: General;  Laterality: N/A;   SMALL INTESTINE SURGERY     SPINE SURGERY     Injections   THYROIDECTOMY N/A 09/20/2019   Procedure: TOTAL THYROIDECTOMY;  Surgeon: Darnell Level, MD;  Location: [facility];  Service: General;  Laterality: N/A;   TUBAL LIGATION  1995   Social History:  reports that she  has never smoked. She has never used smokeless tobacco. She reports current alcohol use of about 1.0 standard drink of alcohol per week. She reports that she does not currently use drugs after having used the following drugs: Marijuana. Family History:  Family History  Problem Relation Age of Onset   Mental illness Mother    Alcoholism Mother    Cirrhosis Mother    Alcohol abuse Mother    Cancer Father    Lung cancer Father      HOME MEDICATIONS: Allergies as of 05/17/2023       Reactions   Penicillins Nausea Only, Hives, Nausea And Vomiting   Has patient had a PCN reaction causing immediate rash, facial/tongue/throat swelling, SOB or lightheadedness with hypotension: No Has patient had a PCN reaction causing severe rash involving mucus membranes or skin necrosis: No Has patient had a PCN reaction that required hospitalization: No Has patient had a PCN reaction occurring within the last 10 years: Yes--nausea & headache ONLY If all of the above answers are "NO", then may proceed with Cephalosporin use. Has patient had a PCN reaction causing immediate rash, facial/tongue/throat swelling, SOB or lightheadedness with hypotension: No Has patient had a PCN reaction causing severe rash involving mucus membranes or skin necrosis: No Has patient had a PCN reaction that required hospitalization: No Has patient had a PCN reaction occurring within the last 10 years: Yes--nausea & headache ONLY If all of the above answers are "NO", then may proceed with Cephalosporin use. Has patient had a PCN reaction causing immediate rash, facial/tongue/throat swelling, SOB or lightheadedness with hypotension: No Has patient had a PCN reaction causing severe rash involving mucus membranes or skin necrosis: No Has patient had a PCN reaction that required hospitalization: No Has patient had a PCN reaction occurring within the last 10 years: Yes--nausea & headache ONLY If all of the above answers are "NO", then may  proceed with Cephalosporin use.   Bee Venom    Tramadol Nausea Only        Medication List        Accurate as of May 17, 2023 10:25 AM. If you have any questions, ask your nurse or doctor.          acyclovir 400 MG tablet Commonly known as: ZOVIRAX Take 1 tablet (400 mg total) by mouth 2 (two) times daily.   ALIGN PO Take by mouth.   anastrozole 1 MG tablet Commonly known as: ARIMIDEX TAKE 1 TABLET BY MOUTH EVERY DAY   benzonatate 100 MG capsule Commonly known as: TESSALON Take 1 capsule (100 mg total) by mouth every 8 (eight) hours.   BORIC ACID EX Apply topically.   fluconazole 150 MG tablet Commonly known as: DIFLUCAN Take 1 tablet (150 mg total) by mouth every three (3)  days as needed (yeast related to antibiotic therapy). Repeat if needed   fluticasone 50 MCG/ACT nasal spray Commonly known as: FLONASE SPRAY 2 SPRAYS INTO EACH NOSTRIL EVERY DAY   ibuprofen 800 MG tablet Commonly known as: ADVIL Take 800 mg by mouth 3 (three) times daily as needed.   ipratropium 0.03 % nasal spray Commonly known as: ATROVENT Place 2 sprays into both nostrils every 12 (twelve) hours.   levothyroxine 100 MCG tablet Commonly known as: SYNTHROID Take 1 tablet (100 mcg total) by mouth daily.   lidocaine 5 % ointment Commonly known as: XYLOCAINE Apply 1 Application topically 3 (three) times daily. Uses as needed.   NONFORMULARY OR COMPOUNDED ITEM Hydrocortisone compounded vaginal cream.  Place 3 grams per vagina at bedtime for 4 weeks.  Disp:  84 grams, RF  none.  [facility].   OPTI-TEARS OP Place 1 drop into both eyes 3 (three) times daily as needed (for dry/irritated eyes.).   promethazine-dextromethorphan 6.25-15 MG/5ML syrup Commonly known as: PROMETHAZINE-DM Take 5 mLs by mouth 3 (three) times daily as needed for cough.   Vitamin C 100 MG Chew Chew 100 mg by mouth daily.   VITAMIN D3 COMPLETE PO Take 1 tablet by mouth daily.           OBJECTIVE:   PHYSICAL EXAM: VS:BP 122/74 (BP Location: Right Arm, Patient Position: Sitting, Cuff Size: Normal)   Pulse 64   Ht 5' 8.5" (1.74 m)   Wt 200 lb 12.8 oz (91.1 kg)   LMP  (LMP Unknown)   SpO2 99%   BMI 30.09 kg/m    EXAM: General: Pt appears well and is in NAD  Neck: General: Supple without adenopathy. Thyroid: Surgically removed . No nodules appreciated.  Lungs: Clear with good BS bilat   Heart: Auscultation: RRR.  Extremities:  BL LE: No pretibial edema  Mental Status: Judgment, insight: Intact Orientation: Oriented to time, place, and person Mood and affect: No depression, anxiety, or agitation     DATA REVIEWED: ****   Latest Reference Range & Units 05/17/22 10:43  Thyroglobulin ng/mL 0.1 (L)  Thyroglobulin Ab < or = 1 IU/mL <1  (L): Data is abnormally low   Pathology report 09/20/2019 THYROID, TOTAL THYROIDECTOMY:  - Papillary thyroid carcinoma, 1.3 cm.  - Margins not involved.  - Multiple adenomatous nodules.  - See oncology table.   ONCOLOGY TABLE:  THYROID GLAND:  Procedure: Total thyroidectomy.  Tumor Focality: Unifocal.  Tumor Site: Left lobe, mid.  Tumor Size: 1.3 x 1 x 1 cm.  Histologic Type: Papillary thyroid carcinoma.  Margins: Free of tumor.  Angioinvasion: Not identified.  Lymphatic Invasion: Not identified.  Extrathyroidal extension: Not identified.  Regional Lymph Nodes: No lymph nodes submitted.  Pathologic Stage Classification (pTNM, AJCC 8th Edition): pT1b, pNX   Thyroid Ultrasound 11/28/2022  FINDINGS: The thyroid gland is surgically absent. Sonographic evaluation of the resection bed demonstrates no evidence of residual or recurrent thyroid tissue. No nodularity or adenopathy.   IMPRESSION: Surgical changes of total thyroidectomy without evidence of residual recurrent disease.      Post Rx WBS 08/27/2021  FINDINGS: Uptake at thyroid remnant greater on RIGHT.   No additional sites of abnormal radio  iodine accumulation identified.   Small amount of excreted tracer within urinary bladder.   IMPRESSION: Small thyroid remnant.   No scintigraphic evidence of iodine avid metastatic thyroid cancer.   ASSESSMENT / PLAN / RECOMMENDATIONS:   Hx of papillary Thyroid carcinoma (pT1b, pNX) Stage I:     -  S/P total thyroidectomy 09/20/2019 - TSH goal 0.5-2.0 uIU/mL  -Her previous thyroid bed ultrasound shows a stable subcentimeter right thyroid focus, by March 2023 and new subcentimeter left thyroid bed focus has been noted - She is S/P remnant ablation 08/27/2021, with interval decrease in the size of residual tissue in the right thyroid resection bed 10/2021 -Repeat thyroid bed ultrasound 05/2022 revealed that the previously seen right thyroid bed nodule was no longer identified -Repeat Ultrasound was stable 10/2022 - Will repeat thyroid bed ultrasound    2. Post-operative Hypothyroidism      -She is clinically euthyroid  -TSH ****   Medications  -Continue levothyroxine 100 mcg daily   F/U in 6 months    Signed electronically by: Lyndle Herrlich, MD  [facility] End[facility]ology  [facility] [address] [place], [place] [address] Phone: [phone] FAX: [phone]      CC: Jarold Motto, [place] [address] [place] [address] Phone: [phone]  Fax: [phone]   Return to Endocrinology clinic as below: Future Appointments  Date Time Provider Department Center  05/17/2023 10:30 AM Majorie Santee, Konrad Dolores, MD [facility] None  06/22/2023 11:00 AM Rachel Moulds, MD [facility] None  07/06/2023  1:40 PM [facility] ANNUAL WELLNESS VISIT 1 [facility] PEC  01/16/2024  1:30 PM [place] Shirley Friar, MD [facility] None

## 2023-05-18 ENCOUNTER — Encounter: Payer: Self-pay | Admitting: Internal Medicine

## 2023-05-18 LAB — THYROGLOBULIN LEVEL: Thyroglobulin: 0.1 ng/mL — ABNORMAL LOW

## 2023-05-18 LAB — TSH: TSH: 1.62 m[IU]/L (ref 0.40–4.50)

## 2023-05-18 LAB — THYROGLOBULIN ANTIBODY: Thyroglobulin Ab: <1 [IU]/mL (ref <=1)

## 2023-05-18 MED ORDER — LEVOTHYROXINE SODIUM 100 MCG PO TABS: 100.0000 ug | ORAL_TABLET | Freq: Every day | ORAL | 3 refills | Status: AC

## 2023-05-22 ENCOUNTER — Ambulatory Visit
Admission: RE | Admit: 2023-05-22 | Discharge: 2023-05-22 | Disposition: A | Discharge status: Home / Self Care | Source: Ambulatory Visit | Attending: Internal Medicine | Admitting: Internal Medicine

## 2023-05-22 DIAGNOSIS — Z85850 Personal history of malignant neoplasm of thyroid: Secondary | ICD-10-CM

## 2023-05-24 ENCOUNTER — Encounter: Payer: Self-pay | Admitting: Internal Medicine

## 2023-06-21 ENCOUNTER — Telehealth: Payer: Self-pay

## 2023-06-21 NOTE — Telephone Encounter (Signed)
 Spoke with patient and confirmed appointment on 4/24

## 2023-06-21 NOTE — Progress Notes (Unsigned)
 [facility]  [phone] [phone]    ID: Madison Hopkins DOB: Sep 13, 1966  MR#: [id number]  NGE#:[id number]  Patient Care Team: Alexander Iba, PA as PCP - General (Physician Assistant) [name], Rozella Cornfield, MD (Inactive) as Consulting Physician (Oncology) Jorie Newness, Blondie Burke, MD as Consulting Physician (Obstetrics and Gynecology) Bensimhon, Rheta Celestine, MD as Consulting Physician (Cardiology) Cindee Crazier, MD as Attending Physician (Physical Medicine and Rehabilitation) [facility], Julian Obey, MD as Consulting Physician (Endocrinology) Lockie Rima, MD as Consulting Physician (General Surgery) OTHER MD:   CHIEF COMPLAINT: HER-2 positive, weakly estrogen receptor positive breast cancer  CURRENT TREATMENT: Anastrozole   INTERVAL HISTORY:  Salome returns today for follow-up of her HER-2 positive, weakly estrogen receptor positive breast cancer.  Ms. Raniah is here for telephone follow up. She wanted to review her mammogram and ultrasound results. She tells me that she is worried about having to wait for 6 months to do a repeat ultrasound.  She was wondering if we can discuss with radiology to see if she needs a biopsy sooner than that.  She is otherwise taking anastrozole  as prescribed and denies any new health issues.  She is very apprehensive about the mammogram findings   COVID 19 VACCINATION STATUS: Pfizer x3, most recently 01/2020; infection 01/2019 and 08/2020 (received molnupiravir )   HISTORY OF CURRENT ILLNESS: From the original intake note:  Madison Hopkins noted a mass in the left axilla sometime in January or February 2019.  She eventually brought her to her gynecologist's attention, and underwent bilateral diagnostic mammography with tomography and left breast ultrasonography at [facility] on 08/04/2017 showing: breast density category B. There is a highly suspicious hypoechoic mass in the left breast at the 2 o'clock upper outer  quadrant measuring 2.3 x 1.6 x 2.2 cm, located 2 cm from the nipple. Sonographically, there were 2 enlarged lymph nodes in the left axilla, the largest with cortical thickening measuring 2.5 cm. No evidence of malignancy was seen in the right breast.   Accordingly on 08/07/2017 she proceeded to biopsy of the left breast area and 1 of the lymph nodes in question. The pathology from this procedure showed ([id number]): Invasive ductal carcinoma, grade 3. Metastatic carcinoma was found in one left axillary lymph node. Prognostic indicators significant for: estrogen receptor, 30% positive with weak staining intensity and progesterone receptor, 0% negative. Proliferation marker Ki67 at 80%. HER2 amplified with ratios HER2/CEP17 signals 2.32 and average HER2 copies per cell 6.60  The patient's subsequent history is as detailed below.   PAST MEDICAL HISTORY: Past Medical History:  Diagnosis Date   Abnormal Pap smear of vagina 07/28/2016   LGSIL; colpo 07/2016 atrophic squamous cells; colpo 07/2017 - atypia   Allergy    Arthritis    Breast cancer (HCC) 2018   Metastatic Left breast   Elevated hemoglobin A1c 07/28/2016   level - 6.1   Endometriosis    GERD (gastroesophageal reflux disease)    HSV-2 infection    Iron deficiency anemia    Low vitamin D  level 07/28/2016   level 24.7   Osteoporosis 2024   forearm   Personal history of chemotherapy    finished 10/19   Personal history of radiation therapy    finished 03/2018   Thyroid  cancer (HCC)    Thyroid  neoplasm   GERD but no ulcers   PAST SURGICAL HISTORY: Past Surgical History:  Procedure Laterality Date   ABDOMINAL HYSTERECTOMY     BREAST BIOPSY Left 08/09/2017  BREAST LUMPECTOMY Left 01/22/2018   BREAST LUMPECTOMY WITH RADIOACTIVE SEED AND AXILLARY LYMPH NODE DISSECTION Left 01/22/2018   Procedure: LEFT BREAST LUMPECTOMY WITH RADIOACTIVE SEED AND COMPLETE LEFT AXILLARY LYMPH NODE DISSECTION;  Surgeon: Boyce Byes, MD;  Location:  [facility];  Service: General;  Laterality: Left;   CESAREAN SECTION     COLON SURGERY     COLOSTOMY     COLOSTOMY REVERSAL     FOOT SURGERY Right    done spur   OOPHORECTOMY     PORT-A-CATH REMOVAL Right 08/28/2018   Procedure: REMOVAL PORT-A-CATH;  Surgeon: Boyce Byes, MD;  Location: [facility];  Service: General;  Laterality: Right;   PORTACATH PLACEMENT N/A 09/06/2017   Procedure: INSERTION PORT-A-CATH;  Surgeon: Juanita Norlander, MD;  Location: [facility];  Service: General;  Laterality: N/A;   SMALL INTESTINE SURGERY     SPINE SURGERY     Injections   THYROIDECTOMY N/A 09/20/2019   Procedure: TOTAL THYROIDECTOMY;  Surgeon: Oralee Billow, MD;  Location: [facility];  Service: General;  Laterality: N/A;   TUBAL LIGATION  1995  Hysterectomy with salpingo-oophorectomy, Tonsillectomy, Plantar Fascitis Right Foot Surgery    FAMILY HISTORY: Family History  Problem Relation Age of Onset   Mental illness Mother    Alcoholism Mother    Cirrhosis Mother    Alcohol abuse Mother    Cancer Father    Lung cancer Father    The patient' father died at age 21 due to lung cancer (heavy smoker). The patient's mother died due to liver cirrhosis (heavy drinker). The patient has 2 brothers and no sisters. There was a paternal 1st cousin with colon cancer diagnosed in the mid 40's, who also had cervical cancer. The mother of this 1st cousin (the patient's paternal aunt) had cancer (the patient's is unsure of what type). There was also a paternal uncle with prostate cancer diagnosed in the 58's. The patient denies a family history of breast or ovarian cancer.     GYNECOLOGIC HISTORY:  No LMP recorded (lmp unknown). Patient has had a hysterectomy. Menarche: 68 years old Age at first live birth: 68 years old She is GXP2.  The patient is status post total hysterectomy with bilateral salpingo-oophorectomy in 1995.  She never used contraception. She notes that she had an estrogen  shot one time but no other HRTs.    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  She describes herself single. At home is herself and no pets. Her son, Curly Douglas lives in [place], [place] where he works as a Loss adjuster, chartered. The patient's daughter Soyla Duverney lives in [place]  in customer service for [employer]. The patient has 5 grandchildren and 4 great grandchildren. She attends [employer].   ADVANCED DIRECTIVES: at the 08/16/2017 visit the patient was given the appropriate forms to complete on notarized at her discretion   HEALTH MAINTENANCE: Social History   Tobacco Use   Smoking status: Never   Smokeless tobacco: Never  Vaping Use   Vaping status: Never Used  Substance Use Topics   Alcohol use: Yes    Alcohol/week: 1.0 standard drink of alcohol    Types: 1 Glasses of wine per week    Comment: Social   Drug use: Not Currently    Types: Marijuana    Comment: everyday     Colonoscopy: 2009?  PAP: 10/2019, negative  Bone density: 10/24/2016, -0.7   Allergies  Allergen Reactions   Penicillins Nausea Only, Hives and Nausea And Vomiting    Has patient had a PCN reaction causing immediate rash, facial/tongue/throat swelling, SOB or lightheadedness with hypotension: No Has patient had a PCN reaction causing severe rash involving mucus membranes or skin necrosis: No Has patient had a PCN reaction that required hospitalization: No Has patient had a PCN reaction occurring within the last 10 years: Yes--nausea & headache ONLY If all of the above answers are "NO", then may proceed with Cephalosporin use.  Has patient had a PCN reaction causing immediate rash, facial/tongue/throat swelling, SOB or lightheadedness with hypotension: No Has patient had a PCN reaction causing severe rash involving mucus membranes or skin necrosis: No Has patient had a PCN reaction that required hospitalization: No Has patient had a PCN reaction occurring  within the last 10 years: Yes--nausea & headache ONLY If all of the above answers are "NO", then may proceed with Cephalosporin use. Has patient had a PCN reaction causing immediate rash, facial/tongue/throat swelling, SOB or lightheadedness with hypotension: No Has patient had a PCN reaction causing severe rash involving mucus membranes or skin necrosis: No Has patient had a PCN reaction that required hospitalization: No Has patient had a PCN reaction occurring within the last 10 years: Yes--nausea & headache ONLY If all of the above answers are "NO", then may proceed with Cephalosporin use.   Bee Venom    Tramadol  Nausea Only    Current Outpatient Medications  Medication Sig Dispense Refill   acyclovir  (ZOVIRAX ) 400 MG tablet Take 1 tablet (400 mg total) by mouth 2 (two) times daily. 180 tablet 1   anastrozole  (ARIMIDEX ) 1 MG tablet TAKE 1 TABLET BY MOUTH EVERY DAY 90 tablet 4   Artificial Tear Solution (OPTI-TEARS OP) Place 1 drop into both eyes 3 (three) times daily as needed (for dry/irritated eyes.).     Ascorbic Acid (VITAMIN C) 100 MG CHEW Chew 100 mg by mouth daily.      benzonatate  (TESSALON ) 100 MG capsule Take 1 capsule (100 mg total) by mouth every 8 (eight) hours. 21 capsule 0   BORIC ACID EX Apply topically.      fluconazole  (DIFLUCAN ) 150 MG tablet Take 1 tablet (150 mg total) by mouth every three (3) days as needed (yeast related to antibiotic therapy). Repeat if needed 2 tablet 0   fluticasone  (FLONASE ) 50 MCG/ACT nasal spray SPRAY 2 SPRAYS INTO EACH NOSTRIL EVERY DAY 16 mL 2   ibuprofen  (ADVIL ) 800 MG tablet Take 800 mg by mouth 3 (three) times daily as needed.     ipratropium (ATROVENT ) 0.03 % nasal spray Place 2 sprays into both nostrils every 12 (twelve) hours. 30 mL 12   levothyroxine  (SYNTHROID ) 100 MCG tablet Take 1 tablet (100 mcg total) by mouth daily. 90 tablet 3   lidocaine  (XYLOCAINE ) 5 % ointment Apply 1 Application topically 3 (three) times daily. Uses as  needed. 30 g 1   Multiple Vitamins-Minerals (VITAMIN D3 COMPLETE PO) Take 1 tablet by mouth daily.      NONFORMULARY OR COMPOUNDED ITEM Hydrocortisone  compounded vaginal cream.  Place 3 grams per vagina at bedtime for 4 weeks.  Disp:  84 grams, RF  none.  [facility]. 84 each 0   Probiotic Product (ALIGN PO) Take by mouth.      promethazine -dextromethorphan (PROMETHAZINE -DM) 6.25-15 MG/5ML syrup Take 5 mLs by mouth 3 (three) times daily as needed for cough. (Patient not taking: Reported on 05/17/2023) 180 mL 0   No  current facility-administered medications for this visit.    OBJECTIVE: African-American woman who appears younger than stated age  There were no vitals filed for this visit.    There is no height or weight on file to calculate BMI.   Wt Readings from Last 3 Encounters:  05/17/23 200 lb 12.8 oz (91.1 kg)  03/14/23 199 lb 1.2 oz (90.3 kg)  02/08/23 199 lb (90.3 kg)  ECOG FS:1 - Symptomatic but completely ambulatory  Sclerae unicteric, EOMs intact Neck: No cervical or regional adenopathy Breast: Left breast status postlumpectomy and radiation.  No palpable masses in bilateral breast.  No regional adenopathy.  LAB RESULTS:  CMP     Component Value Date/Time   NA 138 10/19/2022 1335   K 3.4 (L) 10/19/2022 1335   CL 103 10/19/2022 1335   CO2 27 10/19/2022 1335   GLUCOSE 91 10/19/2022 1335   BUN 12 10/19/2022 1335   CREATININE 0.89 10/19/2022 1335   CREATININE 0.94 12/22/2021 1133   CREATININE 0.90 01/14/2020 0852   CALCIUM  10.2 10/19/2022 1335   PROT 7.7 10/19/2022 1335   ALBUMIN 4.5 10/19/2022 1335   AST 16 10/19/2022 1335   AST 13 (L) 12/22/2021 1133   ALT 21 10/19/2022 1335   ALT 15 12/22/2021 1133   ALKPHOS 116 10/19/2022 1335   BILITOT 0.6 10/19/2022 1335   BILITOT 0.6 12/22/2021 1133   GFRNONAA >60 10/06/2022 1308   GFRNONAA >60 12/22/2021 1133   GFRAA >60 09/21/2019 0627   GFRAA >60 10/25/2017 1055    No results found for: "TOTALPROTELP",  "ALBUMINELP", "A1GS", "A2GS", "BETS", "BETA2SER", "GAMS", "MSPIKE", "SPEI"  No results found for: "KPAFRELGTCHN", "LAMBDASER", "KAPLAMBRATIO"  Lab Results  Component Value Date   WBC 9.3 10/19/2022   NEUTROABS 4.8 10/12/2022   HGB 12.2 10/19/2022   HCT 37.8 10/19/2022   MCV 84.2 10/19/2022   PLT 331.0 10/19/2022   No results found for: "LABCA2"  No components found for: "JJOACZ660"  No results for input(s): "INR" in the last 168 hours.  No results found for: "LABCA2"  No results found for: "YTK160"  No results found for: "CAN125"  No results found for: "CAN153"  No results found for: "CA2729"  No components found for: "HGQUANT"  No results found for: "CEA1", "CEA" / No results found for: "CEA1", "CEA"   No results found for: "AFPTUMOR"  No results found for: "CHROMOGRNA"  No results found for: "TOTALPROTELP", "ALBUMINELP", "A1GS", "A2GS", "BETS", "BETA2SER", "GAMS", "MSPIKE", "SPEI" (this displays SPEP labs)  No results found for: "KPAFRELGTCHN", "LAMBDASER", "KAPLAMBRATIO" (kappa/lambda light chains)  No results found for: "HGBA", "HGBA2QUANT", "HGBFQUANT", "HGBSQUAN" (Hemoglobinopathy evaluation)   No results found for: "LDH"  Lab Results  Component Value Date   IRON 67 10/19/2022   TIBC 399.0 10/19/2022   IRONPCTSAT 16.8 (L) 10/19/2022   (Iron and TIBC)  Lab Results  Component Value Date   FERRITIN 65.6 10/19/2022    Urinalysis    Component Value Date/Time   COLORURINE YELLOW 10/19/2022 1335   APPEARANCEUR CLEAR 10/19/2022 1335   LABSPEC 1.010 10/19/2022 1335   PHURINE 7.0 10/19/2022 1335   GLUCOSEU NEGATIVE 10/19/2022 1335   HGBUR NEGATIVE 10/19/2022 1335   BILIRUBINUR NEGATIVE 10/19/2022 1335   BILIRUBINUR neg 02/01/2021 1458   KETONESUR NEGATIVE 10/19/2022 1335   PROTEINUR NEGATIVE 11/18/2021 1509   UROBILINOGEN 0.2 10/19/2022 1335   NITRITE NEGATIVE 10/19/2022 1335   LEUKOCYTESUR NEGATIVE 10/19/2022 1335    STUDIES: No results  found.    ELIGIBLE FOR AVAILABLE RESEARCH PROTOCOL:  No   ASSESSMENT:   68 y.o. [place], [place] woman status post left breast upper outer quadrant and left axillary lymph node biopsy 08/07/2017, both positive for a T2 N1, stage IIB invasive ductal carcinoma, grade 3, estrogen receptor weakly positive, progesterone receptor negative, but HER-2 amplified, with an MIB-1 of 80%  (a) staging chest CT and bone scan 08/30/2017 showed no evidence of metastatic disease  (1) neoadjuvant chemotherapy will consist of carboplatin , docetaxel , trastuzumab , and Pertuzumab  given every 21 days x 6 starting 09/07/2017, perjeta  omitted with cycle 2 and 3 due to diarrhea  (a) Gemcitabine  substituted for Docetaxel  beginning with cycle 5 and 6 for concerns of neuropathy  (2) trastuzumab  continued to complete 6 months, last dose 04/06/2018  (a) echocardiogram 08/28/2017 showed an ejection fraction in the 60-65% range.  (b) echocardiogram on 11/29/2017 showed an EF of 55-60%  (c) echocardiogram 03/07/2018 showed an ejection fraction in the 55-60% range  (3) Left lumpectomy on 01/22/2018 shows a ypT1b pN1a, grade 3 residual invasive ductal carcinoma, agnostic panel HER-2 negative, ER 40% weakly positive, PR negative.   (a) foundation one testing requested on 02/02/2018  (b) total of 11 axillary lymph nodes removed (1+)  (4) adjuvant radiation completed 04/20/2018  (5) started anastrozole  05/30/2018  (a) bone density 10/25/2016 normal with a T score of -0.7  (b) bone density 11/28/2019 shows a T score of -1.5  (6) status post total thyroidectomy 09/20/2019 for papillary thyroid  carcinoma, pT1b pNX, with negative margins   PLAN:    *Total Encounter Time as defined by the Centers for Medicare and Medicaid Services includes, in addition to the face-to-face time of a patient visit (documented in the note above) non-face-to-face time: obtaining and reviewing outside history, ordering and reviewing medications,  tests or procedures, care coordination (communications with other health care professionals or caregivers) and documentation in the medical record.

## 2023-06-22 ENCOUNTER — Telehealth: Payer: Self-pay

## 2023-06-22 ENCOUNTER — Inpatient Hospital Stay: Payer: PPO | Attending: Hematology and Oncology | Admitting: Hematology and Oncology

## 2023-06-22 VITALS — BP 136/72 | HR 66 | Temp 97.4°F | Resp 16 | Wt 196.9 lb

## 2023-06-22 DIAGNOSIS — Z85850 Personal history of malignant neoplasm of thyroid: Secondary | ICD-10-CM | POA: Insufficient documentation

## 2023-06-22 DIAGNOSIS — Z170 Estrogen receptor positive status [ER+]: Secondary | ICD-10-CM | POA: Insufficient documentation

## 2023-06-22 DIAGNOSIS — C773 Secondary and unspecified malignant neoplasm of axilla and upper limb lymph nodes: Secondary | ICD-10-CM | POA: Diagnosis not present

## 2023-06-22 DIAGNOSIS — Z79811 Long term (current) use of aromatase inhibitors: Secondary | ICD-10-CM | POA: Insufficient documentation

## 2023-06-22 DIAGNOSIS — C50412 Malignant neoplasm of upper-outer quadrant of left female breast: Secondary | ICD-10-CM | POA: Insufficient documentation

## 2023-06-22 NOTE — Telephone Encounter (Signed)
-----   Message from [place] sent at 06/22/2023 11:14 AM EDT ----- Rice Chamorro  Please look at the addendum on last mammogram Mar 09 2023, I spoke to Dr Alinda Apley and she said she will do aspiration of the suspected sites, and we ordered this. Pt says she never received a call and she is extremely disappointed. She never called them. Can u follow up on this and see if we can scheduled this aspiration asap.  Thanks,

## 2023-06-22 NOTE — Telephone Encounter (Signed)
 Called pt and expressed apology for not hearing from [facility] regarding US  asp. Email sent to [facility] to expedite this request. Pt is aware and verbalized thanks. She knows to call next week if she has not heard from them to schedule.

## 2023-06-23 ENCOUNTER — Other Ambulatory Visit: Payer: Self-pay | Admitting: Hematology and Oncology

## 2023-06-23 DIAGNOSIS — Z170 Estrogen receptor positive status [ER+]: Secondary | ICD-10-CM

## 2023-06-27 ENCOUNTER — Ambulatory Visit
Admission: RE | Admit: 2023-06-27 | Discharge: 2023-06-27 | Disposition: A | Discharge status: Home / Self Care | Source: Ambulatory Visit | Attending: Hematology and Oncology | Admitting: Hematology and Oncology

## 2023-06-27 DIAGNOSIS — Z170 Estrogen receptor positive status [ER+]: Secondary | ICD-10-CM

## 2023-06-27 DIAGNOSIS — N6011 Diffuse cystic mastopathy of right breast: Secondary | ICD-10-CM | POA: Diagnosis not present

## 2023-06-28 ENCOUNTER — Other Ambulatory Visit: Payer: Self-pay | Admitting: Hematology and Oncology

## 2023-06-28 DIAGNOSIS — C50412 Malignant neoplasm of upper-outer quadrant of left female breast: Secondary | ICD-10-CM

## 2023-06-28 NOTE — Progress Notes (Signed)
 Screening mammogram ordered for Dec 2025

## 2023-07-06 ENCOUNTER — Ambulatory Visit: Payer: PPO

## 2023-07-11 ENCOUNTER — Ambulatory Visit (INDEPENDENT_AMBULATORY_CARE_PROVIDER_SITE_OTHER)

## 2023-07-11 VITALS — Ht 68.0 in | Wt 196.0 lb

## 2023-07-11 DIAGNOSIS — Z0000 Encounter for general adult medical examination without abnormal findings: Secondary | ICD-10-CM | POA: Diagnosis not present

## 2023-07-11 NOTE — Progress Notes (Signed)
 Subjective:   Madison Hopkins is a 68 y.o. who presents for a Medicare Wellness preventive visit.  As a reminder, Annual Wellness Visits don't include a physical exam, and some assessments may be limited, especially if this visit is performed virtually. We may recommend an in-person visit if needed.  Visit Complete: Virtual I connected with  Madison Hopkins on 07/11/23 by a audio enabled telemedicine application and verified that I am speaking with the correct person using two identifiers.  Patient Location: Home  Provider Location: Office/Clinic  I discussed the limitations of evaluation and management by telemedicine. The patient expressed understanding and agreed to proceed.  Vital Signs: Because this visit was a virtual/telehealth visit, some criteria may be missing or patient reported. Any vitals not documented were not able to be obtained and vitals that have been documented are patient reported.  VideoDeclined- This patient declined Librarian, academic. Therefore the visit was completed with audio only.  Persons Participating in Visit: Patient.  AWV Questionnaire: Yes: Patient Medicare AWV questionnaire was completed by the patient on 07/03/23; I have confirmed that all information answered by patient is correct and no changes since this date.  Cardiac Risk Factors include: advanced age (>7men, >67 women)     Objective:     Today's Vitals   07/11/23 1203  Weight: 196 lb (88.9 kg)  Height: 5\' 8"  (1.727 m)   Body mass index is 29.8 kg/m.     07/11/2023   12:11 PM 11/17/2022    2:07 PM 10/06/2022    1:07 PM 06/30/2022    1:58 PM 07/02/2021    1:30 PM 04/13/2020    3:21 PM 09/20/2019   12:00 PM  Advanced Directives  Does Patient Have a Medical Advance Directive? Yes No No Yes No No Yes  Type of Estate agent of [place];Living will   Healthcare Power of [place];Living will   Healthcare Power of [place];Living will  Does  patient want to make changes to medical advance directive? No - Patient declined   No - Patient declined   No - Patient declined  Copy of Healthcare Power of Attorney in Chart? Yes - validated most recent copy scanned in chart (See row information)   Yes - validated most recent copy scanned in chart (See row information)     Would patient like information on creating a medical advance directive?  No - Patient declined   Yes (MAU/Ambulatory/Procedural Areas - Information given) Yes (MAU/Ambulatory/Procedural Areas - Information given)     Current Medications (verified) Outpatient Encounter Medications as of 07/11/2023  Medication Sig   anastrozole  (ARIMIDEX ) 1 MG tablet TAKE 1 TABLET BY MOUTH EVERY DAY   Artificial Tear Solution (OPTI-TEARS OP) Place 1 drop into both eyes 3 (three) times daily as needed (for dry/irritated eyes.).   Ascorbic Acid (VITAMIN C) 100 MG CHEW Chew 100 mg by mouth daily.    benzonatate  (TESSALON ) 100 MG capsule Take 1 capsule (100 mg total) by mouth every 8 (eight) hours.   BORIC ACID EX Apply topically.    fluticasone  (FLONASE ) 50 MCG/ACT nasal spray SPRAY 2 SPRAYS INTO EACH NOSTRIL EVERY DAY   ibuprofen  (ADVIL ) 800 MG tablet Take 800 mg by mouth 3 (three) times daily as needed.   ipratropium (ATROVENT ) 0.03 % nasal spray Place 2 sprays into both nostrils every 12 (twelve) hours.   levothyroxine  (SYNTHROID ) 100 MCG tablet Take 1 tablet (100 mcg total) by mouth daily.   lidocaine  (XYLOCAINE ) 5 %  ointment Apply 1 Application topically 3 (three) times daily. Uses as needed.   Multiple Vitamins-Minerals (VITAMIN D3 COMPLETE PO) Take 1 tablet by mouth daily.    NONFORMULARY OR COMPOUNDED ITEM Hydrocortisone  compounded vaginal cream.  Place 3 grams per vagina at bedtime for 4 weeks.  Disp:  84 grams, RF  none.  [facility].   Probiotic Product (ALIGN PO) Take by mouth.    promethazine -dextromethorphan (PROMETHAZINE -DM) 6.25-15 MG/5ML syrup Take 5 mLs by mouth 3  (three) times daily as needed for cough.   acyclovir  (ZOVIRAX ) 400 MG tablet Take 1 tablet (400 mg total) by mouth 2 (two) times daily. (Patient not taking: Reported on 07/11/2023)   fluconazole  (DIFLUCAN ) 150 MG tablet Take 1 tablet (150 mg total) by mouth every three (3) days as needed (yeast related to antibiotic therapy). Repeat if needed (Patient not taking: Reported on 07/11/2023)   [DISCONTINUED] prochlorperazine  (COMPAZINE ) 10 MG tablet Take 1 tablet (10 mg total) by mouth every 6 (six) hours as needed (Nausea or vomiting).   No facility-administered encounter medications on file as of 07/11/2023.    Allergies (verified) Penicillins, Bee venom, and Tramadol    History: Past Medical History:  Diagnosis Date   Abnormal Pap smear of vagina 07/28/2016   LGSIL; colpo 07/2016 atrophic squamous cells; colpo 07/2017 - atypia   Allergy    Arthritis    Breast cancer (HCC) 2018   Metastatic Left breast   Elevated hemoglobin A1c 07/28/2016   level - 6.1   Endometriosis    GERD (gastroesophageal reflux disease)    HSV-2 infection    Iron deficiency anemia    Low vitamin D  level 07/28/2016   level 24.7   Osteoporosis 2024   forearm   Personal history of chemotherapy    finished 10/19   Personal history of radiation therapy    finished 03/2018   Thyroid  cancer (HCC)    Thyroid  neoplasm    Past Surgical History:  Procedure Laterality Date   ABDOMINAL HYSTERECTOMY     BREAST BIOPSY Left 08/09/2017   BREAST LUMPECTOMY Left 01/22/2018   BREAST LUMPECTOMY WITH RADIOACTIVE SEED AND AXILLARY LYMPH NODE DISSECTION Left 01/22/2018   Procedure: LEFT BREAST LUMPECTOMY WITH RADIOACTIVE SEED AND COMPLETE LEFT AXILLARY LYMPH NODE DISSECTION;  Surgeon: Madison Byes, MD;  Location: [facility];  Service: General;  Laterality: Left;   CESAREAN SECTION     COLON SURGERY     COLOSTOMY     COLOSTOMY REVERSAL     FOOT SURGERY Right    done spur   OOPHORECTOMY     PORT-A-CATH REMOVAL  Right 08/28/2018   Procedure: REMOVAL PORT-A-CATH;  Surgeon: Madison Byes, MD;  Location: [facility];  Service: General;  Laterality: Right;   PORTACATH PLACEMENT N/A 09/06/2017   Procedure: INSERTION PORT-A-CATH;  Surgeon: Madison Norlander, MD;  Location: [facility];  Service: General;  Laterality: N/A;   SMALL INTESTINE SURGERY     SPINE SURGERY     Injections   THYROIDECTOMY N/A 09/20/2019   Procedure: TOTAL THYROIDECTOMY;  Surgeon: Oralee Billow, MD;  Location: [facility];  Service: General;  Laterality: N/A;   TUBAL LIGATION  1995   Family History  Problem Relation Age of Onset   Mental illness Mother    Alcoholism Mother    Cirrhosis Mother    Alcohol abuse Mother    Cancer Father    Lung cancer Father    Social History   Socioeconomic History   Marital status: Single  Spouse name: Not on file   Number of children: 2   Years of education: Not on file   Highest education level: 12th grade  Occupational History   Not on file  Tobacco Use   Smoking status: Never   Smokeless tobacco: Never  Vaping Use   Vaping status: Never Used  Substance and Sexual Activity   Alcohol use: Yes    Alcohol/week: 1.0 standard drink of alcohol    Types: 1 Glasses of wine per week    Comment: Social   Drug use: Not Currently    Types: Marijuana    Comment: everyday   Sexual activity: Yes    Birth control/protection: Post-menopausal, Surgical    Comment: Hysterectomy abnormal pap 2018-2019, hx of HSV2  Other Topics Concern   Not on file  Social History Narrative   Retired from [employer] -- retiring next April   Has children    Social Drivers of Corporate investment banker Strain: Low Risk  (07/03/2023)   Overall Financial Resource Strain (CARDIA)    Difficulty of Paying Living Expenses: Not hard at all  Food Insecurity: No Food Insecurity (07/03/2023)   Hunger Vital Sign    Worried About Running Out of Food in the Last Year: Never true    Ran Out of Food in  the Last Year: Never true  Transportation Needs: No Transportation Needs (07/03/2023)   PRAPARE - Administrator, Civil Service (Medical): No    Lack of Transportation (Non-Medical): No  Physical Activity: Insufficiently Active (07/03/2023)   Exercise Vital Sign    Days of Exercise per Week: 3 days    Minutes of Exercise per Session: 20 min  Stress: No Stress Concern Present (07/03/2023)   [employer] of Occupational Health - Occupational Stress Questionnaire    Feeling of Stress : Not at all  Social Connections: Moderately Integrated (07/03/2023)   Social Connection and Isolation Panel [NHANES]    Frequency of Communication with Friends and Family: More than three times a week    Frequency of Social Gatherings with Friends and Family: Twice a week    Attends Religious Services: More than 4 times per year    Active Member of [employer] or Organizations: Yes    Attends Engineer, structural: More than 4 times per year    Marital Status: Never married    Tobacco Counseling Counseling given: Not Answered    Clinical Intake:  Pre-visit preparation completed: Yes  Pain : No/denies pain     BMI - recorded: 29.8 Nutritional Status: BMI 25 -29 Overweight Diabetes: No  Lab Results  Component Value Date   HGBA1C 6.1 06/30/2022   HGBA1C 6.1 01/31/2022   HGBA1C 6.3 02/01/2021     How often do you need to have someone help you when you read instructions, pamphlets, or other written materials from your doctor or pharmacy?: 1 - Never  Interpreter Needed?: No  Information entered by :: Lamont Pilsner, LPN   Activities of Daily Living     07/11/2023   12:06 PM  In your present state of health, do you have any difficulty performing the following activities:  Hearing? 0  Vision? 0  Difficulty concentrating or making decisions? 0  Walking or climbing stairs? 0  Dressing or bathing? 0  Doing errands, shopping? 0  Preparing Food and eating ? N  Using the Toilet?  N  In the past six months, have you accidently leaked urine? N  Do you have problems with loss of bowel control? N  Managing your Medications? N  Managing your Finances? N  Housekeeping or managing your Housekeeping? N    Patient Care Team: Alexander Iba, [place] as PCP - General (Physician Assistant) Greta Leatherwood, MD as Consulting Physician (Obstetrics and Gynecology) Julane Ny, Rheta Celestine, MD as Consulting Physician (Cardiology) Cindee Crazier, MD as Attending Physician (Physical Medicine and Rehabilitation) [facility], Ibtehal Jaralla, MD as Consulting Physician (Endocrinology) Lockie Rima, MD as Consulting Physician (General Surgery)  Indicate any recent Medical Services you may have received from other than [facility] providers in the past year (date may be approximate).     Assessment:    This is a routine wellness examination for Madison Hopkins.  Hearing/Vision screen Hearing Screening - Comments:: Pt denies any hearing issues  Vision Screening - Comments:: Wears rx glasses - up to date with routine eye exams with [facility]     Goals Addressed             This Visit's Progress    Patient Stated       Maintain health and activity        Depression Screen     07/11/2023   12:10 PM 11/02/2022   10:04 AM 10/19/2022    1:07 PM 06/30/2022    2:00 PM 06/29/2022    1:18 PM 06/07/2022    3:19 PM 01/31/2022   10:44 AM  PHQ 2/9 Scores  PHQ - 2 Score 0 0 5 0 0 0 1  PHQ- 9 Score  1 9 0 6 2 1     Fall Risk     07/11/2023   12:12 PM 06/30/2022    1:59 PM 07/02/2021    1:37 PM 02/01/2021    1:16 PM 02/20/2019   11:36 AM  Fall Risk   Falls in the past year? 0 0 1 1 1   Number falls in past yr: 0 0 1 0 0  Injury with Fall? 0 0 1 1 1   Comment     eye injury  Risk for fall due to : No Fall Risks Impaired vision Impaired vision    Follow up Falls prevention discussed Falls prevention discussed Falls prevention discussed  Falls evaluation completed    MEDICARE RISK AT HOME:  Medicare Risk  at Home Any stairs in or around the home?: Yes If so, are there any without handrails?: No Home free of loose throw rugs in walkways, pet beds, electrical cords, etc?: Yes Adequate lighting in your home to reduce risk of falls?: Yes Life alert?: No Use of a cane, walker or w/c?: No Grab bars in the bathroom?: Yes Shower chair or bench in shower?: No Elevated toilet seat or a handicapped toilet?: No  TIMED UP AND GO:  Was the test performed?  No  Cognitive Function: 6CIT completed        07/11/2023   12:14 PM 06/30/2022    2:02 PM 07/02/2021    1:38 PM  6CIT Screen  What Year? 0 points 0 points 0 points  What month? 0 points 0 points 0 points  What time? 0 points 0 points 0 points  Count back from 20 0 points 0 points 0 points  Months in reverse 0 points 0 points 0 points  Repeat phrase 0 points 0 points 0 points  Total Score 0 points 0 points 0 points    Immunizations Immunization History  Administered Date(s) Administered   Fluad Quad(high Dose 65+) 01/31/2022   Fluad  Trivalent(High Dose 65+) 11/02/2022   Influenza,inj,Quad PF,6+ Mos 01/14/2020   PFIZER Comirnaty(Gray Top)Covid-19 Tri-Sucrose Vaccine 10/22/2020   PFIZER(Purple Top)SARS-COV-2 Vaccination 07/01/2019, 07/22/2019, 02/16/2020   PNEUMOCOCCAL CONJUGATE-20 01/31/2022   Pneumococcal Polysaccharide-23 07/28/2016   Tdap 10/23/2018    Screening Tests Health Maintenance  Topic Date Due   Zoster Vaccines- Shingrix (1 of 2) Never done   Fecal DNA (Cologuard)  01/21/2023   COVID-19 Vaccine (5 - 2024-25 season) 11/02/2023 (Originally 10/30/2022)   INFLUENZA VACCINE  09/29/2023   MAMMOGRAM  02/03/2024   DEXA SCAN  03/02/2024   Medicare Annual Wellness (AWV)  07/10/2024   DTaP/Tdap/Td (2 - Td or Tdap) 10/22/2028   Pneumonia Vaccine 87+ Years old  Completed   Hepatitis C Screening  Completed   HPV VACCINES  Aged Out   Meningococcal B Vaccine  Aged Out    Health Maintenance  Health Maintenance Due  Topic Date  Due   Zoster Vaccines- Shingrix (1 of 2) Never done   Fecal DNA (Cologuard)  01/21/2023   Health Maintenance Items Addressed: See Nurse Notes  Additional Screening:  Vision Screening: Recommended annual ophthalmology exams for early detection of glaucoma and other disorders of the eye.  Dental Screening: Recommended annual dental exams for proper oral hygiene  Community Resource Referral / Chronic Care Management: CRR required this visit?  No   CCM required this visit?  No   Plan:    I have personally reviewed and noted the following in the patient's chart:   Medical and social history Use of alcohol, tobacco or illicit drugs  Current medications and supplements including opioid prescriptions. Patient is not currently taking opioid prescriptions. Functional ability and status Nutritional status Physical activity Advanced directives List of other physicians Hospitalizations, surgeries, and ER visits in previous 12 months Vitals Screenings to include cognitive, depression, and falls Referrals and appointments  In addition, I have reviewed and discussed with patient certain preventive protocols, quality metrics, and best practice recommendations. A written personalized care plan for preventive services as well as general preventive health recommendations were provided to patient.   Bruno Capri, LPN   1/61/0960   After Visit Summary: (MyChart) Due to this being a telephonic visit, the after visit summary with patients personalized plan was offered to patient via MyChart   Notes: Nothing significant to report at this time. Pt stated she will discuss cologuard or colonoscopy at physical

## 2023-07-11 NOTE — Patient Instructions (Signed)
 Ms. Racca , Thank you for taking time out of your busy schedule to complete your Annual Wellness Visit with me. I enjoyed our conversation and look forward to speaking with you again next year. I, as well as your care team,  appreciate your ongoing commitment to your health goals. Please review the following plan we discussed and let me know if I can assist you in the future. Your Game plan/ To Do List    Referrals: If you haven't heard from the office you've been referred to, please reach out to them at the phone provided.    Follow up Visits: Next Medicare AWV with our clinical staff: 07/22/24 @ 1:00 p.m   Have you seen your provider in the last 6 months (3 months if uncontrolled diabetes)? Yes Next Office Visit with your provider: 01/12/24  Clinician Recommendations:  Aim for 30 minutes of exercise or brisk walking, 6-8 glasses of water, and 5 servings of fruits and vegetables each day.       This is a list of the screening recommended for you and due dates:  Health Maintenance  Topic Date Due   Zoster (Shingles) Vaccine (1 of 2) Never done   Cologuard (Stool DNA test)  01/21/2023   Medicare Annual Wellness Visit  06/30/2023   COVID-19 Vaccine (5 - 2024-25 season) 11/02/2023*   Flu Shot  09/29/2023   Mammogram  02/03/2024   DEXA scan (bone density measurement)  03/02/2024   DTaP/Tdap/Td vaccine (2 - Td or Tdap) 10/22/2028   Pneumonia Vaccine  Completed   Hepatitis C Screening  Completed   HPV Vaccine  Aged Out   Meningitis B Vaccine  Aged Out  *Topic was postponed. The date shown is not the original due date.    Advanced directives: (In Chart) A copy of your advanced directives are scanned into your chart should your provider ever need it. Advance Care Planning is important because it:  [x]  Makes sure you receive the medical care that is consistent with your values, goals, and preferences  [x]  It provides guidance to your family and loved ones and reduces their decisional  burden about whether or not they are making the right decisions based on your wishes.  Follow the link provided in your after visit summary or read over the paperwork we have mailed to you to help you started getting your Advance Directives in place. If you need assistance in completing these, please reach out to us  so that we can help you!  See attachments for Preventive Care and Fall Prevention Tips.

## 2023-08-01 DIAGNOSIS — M791 Myalgia, unspecified site: Secondary | ICD-10-CM | POA: Diagnosis not present

## 2023-08-01 DIAGNOSIS — R079 Chest pain, unspecified: Secondary | ICD-10-CM | POA: Diagnosis not present

## 2023-08-01 DIAGNOSIS — M7918 Myalgia, other site: Secondary | ICD-10-CM | POA: Diagnosis not present

## 2023-08-01 DIAGNOSIS — R0789 Other chest pain: Secondary | ICD-10-CM | POA: Diagnosis not present

## 2023-09-20 ENCOUNTER — Ambulatory Visit: Admitting: Obstetrics and Gynecology

## 2023-09-20 VITALS — BP 142/78 | HR 64 | Temp 98.3°F | Wt 195.0 lb

## 2023-09-20 DIAGNOSIS — N898 Other specified noninflammatory disorders of vagina: Secondary | ICD-10-CM | POA: Diagnosis not present

## 2023-09-20 DIAGNOSIS — B009 Herpesviral infection, unspecified: Secondary | ICD-10-CM | POA: Diagnosis not present

## 2023-09-20 DIAGNOSIS — Z113 Encounter for screening for infections with a predominantly sexual mode of transmission: Secondary | ICD-10-CM | POA: Diagnosis not present

## 2023-09-20 LAB — WET PREP FOR TRICH, YEAST, CLUE

## 2023-09-20 MED ORDER — ACYCLOVIR 400 MG PO TABS: 400.0000 mg | ORAL_TABLET | Freq: Two times a day (BID) | ORAL | 1 refills | Status: DC

## 2023-09-20 NOTE — Progress Notes (Signed)
 68 y.o. G28P0002 female with hx of breast cancer on anastrozole , GSM, s/p hysterctomy here for vaginitis sx. Single.  She reports that her vaginal irritation is much better with aquaphor. However, she has felt irritated since IC 2 wk ago. Same partner x2.21yr. Increased liquids and home herbs has helped. Desires STI testing. Hx of genital herpes Has frequent stools which she believes is causing some irritation   OB History  Gravida Para Term Preterm AB Living  2 2 0 0 0 2  SAB IAB Ectopic Multiple Live Births  0 0 0 0 0    # Outcome Date GA Lbr Len/2nd Weight Sex Type Anes PTL Lv  2 Para     F CS-Unspec     1 Para     M Vag-Spont       Past Medical History:  Diagnosis Date   Abnormal Pap smear of vagina 07/28/2016   LGSIL; colpo 07/2016 atrophic squamous cells; colpo 07/2017 - atypia   Allergy    Arthritis    Breast cancer (HCC) 2018   Metastatic Left breast   Elevated hemoglobin A1c 07/28/2016   level - 6.1   Endometriosis    GERD (gastroesophageal reflux disease)    HSV-2 infection    Iron deficiency anemia    Low vitamin D  level 07/28/2016   level 24.7   Osteoporosis 2024   forearm   Personal history of chemotherapy    finished 10/19   Personal history of radiation therapy    finished 03/2018   Thyroid  cancer (HCC)    Thyroid  neoplasm     Past Surgical History:  Procedure Laterality Date   ABDOMINAL HYSTERECTOMY     BREAST BIOPSY Left 08/09/2017   BREAST LUMPECTOMY Left 01/22/2018   BREAST LUMPECTOMY WITH RADIOACTIVE SEED AND AXILLARY LYMPH NODE DISSECTION Left 01/22/2018   Procedure: LEFT BREAST LUMPECTOMY WITH RADIOACTIVE SEED AND COMPLETE LEFT AXILLARY LYMPH NODE DISSECTION;  Surgeon: Gail Favorite, MD;  Location: [facility];  Service: General;  Laterality: Left;   CESAREAN SECTION     COLON SURGERY     COLOSTOMY     COLOSTOMY REVERSAL     FOOT SURGERY Right    done spur   OOPHORECTOMY     PORT-A-CATH REMOVAL Right 08/28/2018    Procedure: REMOVAL PORT-A-CATH;  Surgeon: Gail Favorite, MD;  Location: [facility];  Service: General;  Laterality: Right;   PORTACATH PLACEMENT N/A 09/06/2017   Procedure: INSERTION PORT-A-CATH;  Surgeon: Ethyl Lenis, MD;  Location: [facility];  Service: General;  Laterality: N/A;   SMALL INTESTINE SURGERY     SPINE SURGERY     Injections   THYROIDECTOMY N/A 09/20/2019   Procedure: TOTAL THYROIDECTOMY;  Surgeon: Eletha Boas, MD;  Location: [facility];  Service: General;  Laterality: N/A;   TUBAL LIGATION  1995    Current Outpatient Medications on File Prior to Visit  Medication Sig Dispense Refill   Artificial Tear Solution (OPTI-TEARS OP) Place 1 drop into both eyes 3 (three) times daily as needed (for dry/irritated eyes.).     Ascorbic Acid (VITAMIN C) 100 MG CHEW Chew 100 mg by mouth daily.      benzonatate  (TESSALON ) 100 MG capsule Take 1 capsule (100 mg total) by mouth every 8 (eight) hours. 21 capsule 0   BORIC ACID EX Apply topically.      fluconazole  (DIFLUCAN ) 150 MG tablet Take 1 tablet (150 mg total) by mouth every three (3) days as needed (yeast related  to antibiotic therapy). Repeat if needed 2 tablet 0   fluticasone  (FLONASE ) 50 MCG/ACT nasal spray SPRAY 2 SPRAYS INTO EACH NOSTRIL EVERY DAY 16 mL 2   ibuprofen  (ADVIL ) 800 MG tablet Take 800 mg by mouth 3 (three) times daily as needed.     ipratropium (ATROVENT ) 0.03 % nasal spray Place 2 sprays into both nostrils every 12 (twelve) hours. 30 mL 12   levothyroxine  (SYNTHROID ) 100 MCG tablet Take 1 tablet (100 mcg total) by mouth daily. 90 tablet 3   Multiple Vitamins-Minerals (VITAMIN D3 COMPLETE PO) Take 1 tablet by mouth daily.      Probiotic Product (ALIGN PO) Take by mouth.      promethazine -dextromethorphan (PROMETHAZINE -DM) 6.25-15 MG/5ML syrup Take 5 mLs by mouth 3 (three) times daily as needed for cough. 180 mL 0   anastrozole  (ARIMIDEX ) 1 MG tablet TAKE 1 TABLET BY MOUTH EVERY DAY 90 tablet 4    [DISCONTINUED] prochlorperazine  (COMPAZINE ) 10 MG tablet Take 1 tablet (10 mg total) by mouth every 6 (six) hours as needed (Nausea or vomiting). 30 tablet 1   No current facility-administered medications on file prior to visit.    Allergies  Allergen Reactions   Penicillins Nausea Only, Hives and Nausea And Vomiting    Has patient had a PCN reaction causing immediate rash, facial/tongue/throat swelling, SOB or lightheadedness with hypotension: No Has patient had a PCN reaction causing severe rash involving mucus membranes or skin necrosis: No Has patient had a PCN reaction that required hospitalization: No Has patient had a PCN reaction occurring within the last 10 years: Yes--nausea & headache ONLY If all of the above answers are NO, then may proceed with Cephalosporin use.  Has patient had a PCN reaction causing immediate rash, facial/tongue/throat swelling, SOB or lightheadedness with hypotension: No Has patient had a PCN reaction causing severe rash involving mucus membranes or skin necrosis: No Has patient had a PCN reaction that required hospitalization: No Has patient had a PCN reaction occurring within the last 10 years: Yes--nausea & headache ONLY If all of the above answers are NO, then may proceed with Cephalosporin use. Has patient had a PCN reaction causing immediate rash, facial/tongue/throat swelling, SOB or lightheadedness with hypotension: No Has patient had a PCN reaction causing severe rash involving mucus membranes or skin necrosis: No Has patient had a PCN reaction that required hospitalization: No Has patient had a PCN reaction occurring within the last 10 years: Yes--nausea & headache ONLY If all of the above answers are NO, then may proceed with Cephalosporin use.   Bee Venom    Tramadol  Nausea Only      PE Today's Vitals   09/20/23 1533  BP: (!) 142/78  Pulse: 64  Temp: 98.3 F (36.8 C)  TempSrc: Oral  SpO2: 98%  Weight: 195 lb (88.5 kg)   Body  mass index is 29.65 kg/m.  Physical Exam Vitals reviewed. Exam conducted with a chaperone present.  Constitutional:      General: She is not in acute distress.    Appearance: Normal appearance.  HENT:     Head: Normocephalic and atraumatic.     Nose: Nose normal.  Eyes:     Extraocular Movements: Extraocular movements intact.     Conjunctiva/sclera: Conjunctivae normal.  Pulmonary:     Effort: Pulmonary effort is normal.  Genitourinary:    General: Normal vulva.     Exam position: Lithotomy position.     Labia:        Right: No  rash, tenderness, lesion or injury.        Left: No rash, tenderness, lesion or injury.      Vagina: Vaginal discharge present.     Comments: White discharge Uterus and cervix absent Musculoskeletal:        General: Normal range of motion.     Cervical back: Normal range of motion.  Lymphadenopathy:     Lower Body: No right inguinal adenopathy. No left inguinal adenopathy.  Neurological:     General: No focal deficit present.     Mental Status: She is alert.  Psychiatric:        Mood and Affect: Mood normal.        Behavior: Behavior normal.      Assessment and Plan:        Screen for STD (sexually transmitted disease) -     SURESWAB CT/NG/T. vaginalis  Vaginal irritation -     WET PREP FOR TRICH, YEAST, CLUE -     Urinalysis,Complete w/RFL Culture  HSV-2 infection -     Acyclovir ; Take 1 tablet (400 mg total) by mouth 2 (two) times daily.  Dispense: 180 tablet; Refill: 1  Continue every day vulvar aquaphor. Recommend coconut oil or silicone lubricant.   Vera LULLA Pa, MD

## 2023-09-21 ENCOUNTER — Other Ambulatory Visit

## 2023-09-21 ENCOUNTER — Encounter: Payer: Self-pay | Admitting: Obstetrics and Gynecology

## 2023-09-21 DIAGNOSIS — N898 Other specified noninflammatory disorders of vagina: Secondary | ICD-10-CM | POA: Diagnosis not present

## 2023-09-21 LAB — URINALYSIS, COMPLETE W/RFL CULTURE
Bacteria, UA: NONE SEEN /HPF
Bilirubin Urine: NEGATIVE
Glucose, UA: NEGATIVE
Hgb urine dipstick: NEGATIVE
Hyaline Cast: NONE SEEN /LPF
Ketones, ur: NEGATIVE
Leukocyte Esterase: NEGATIVE
Nitrites, Initial: NEGATIVE
Protein, ur: NEGATIVE
RBC / HPF: NONE SEEN /HPF (ref 0–2)
Specific Gravity, Urine: 1.015 (ref 1.001–1.035)
WBC, UA: NONE SEEN /HPF (ref 0–5)
pH: 6.5 (ref 5.0–8.0)

## 2023-09-21 LAB — SURESWAB CT/NG/T. VAGINALIS
C. trachomatis RNA, TMA: NOT DETECTED
N. gonorrhoeae RNA, TMA: NOT DETECTED
Trichomonas vaginalis RNA: NOT DETECTED

## 2023-09-21 LAB — NO CULTURE INDICATED

## 2023-09-21 NOTE — Telephone Encounter (Signed)
 Spoke with patient. Patient requesting treatment for symptoms. OV on 09/20/23. Patient reports increased lower abdominal pressure, urinary frequency and urgency last night and today. Denies fever/chills, flank pain, blood in urine. Did not place anything in vagina last night for treatment, has not taken anything OTC.   Reviewed 7/23 OV notes, UA ordered; Sureswab pending. Patient states she left urine for testing. Advised I will f/u with lab to confirm status of UA. Will provide update to Dr. Dallie and f/u with recommendations, patient agreeable.   F/u with lab, was not advised to run UA, urine no longer available.   Dr. Dallie -please review and advise. I can contact patient to have her return for UA.

## 2023-09-21 NOTE — Telephone Encounter (Signed)
 Disregard previous message

## 2023-09-21 NOTE — Telephone Encounter (Signed)
 Spoke with patient, lab appt scheduled for today at 1430. Patient aware she will be called with results.

## 2023-09-21 NOTE — Telephone Encounter (Signed)
 Dr. Dallie -can you confirm what labs are needed? No future orders placed.

## 2023-09-22 ENCOUNTER — Ambulatory Visit: Payer: Self-pay | Admitting: Obstetrics and Gynecology

## 2023-10-18 DIAGNOSIS — M67911 Unspecified disorder of synovium and tendon, right shoulder: Secondary | ICD-10-CM | POA: Diagnosis not present

## 2023-11-17 ENCOUNTER — Encounter: Payer: Self-pay | Admitting: Internal Medicine

## 2023-11-17 ENCOUNTER — Ambulatory Visit: Admitting: Internal Medicine

## 2023-11-17 VITALS — BP 134/72 | HR 62 | Ht 68.0 in

## 2023-11-17 DIAGNOSIS — Z85850 Personal history of malignant neoplasm of thyroid: Secondary | ICD-10-CM

## 2023-11-17 DIAGNOSIS — E890 Postprocedural hypothyroidism: Secondary | ICD-10-CM | POA: Diagnosis not present

## 2023-11-17 NOTE — Progress Notes (Signed)
 Name: Madison Hopkins  MRN/ DOB: [id number], [birth year]    Age/ Sex: 68 y.o., female     PCP: Job Lukes, PA   Reason for Endocrinology Evaluation: [facility]      Initial Endocrinology Clinic Visit: 10/02/2019    PATIENT IDENTIFIER: [name] is a 68 y.o., female with a past medical history of Breast Ca and PTC . She has followed with [facility] since 10/02/2019 for consultative assistance with management of her PTC.   HISTORICAL SUMMARY:   Pt has been referred by Dr. Krystal Spinner.  She had an incidental finding of a left thyroid  nodule ~1.5 cm in diameter on neck MRI during evaluation of chronic neck pain.  This nodule was noted to enlarge to 2.3 cm max diameter on cervical spine MRI 04/2019.   She is S/P FNA in 06/2019 - with left mid pole cytology report of Suspicious for malignancy (Bethesda category V), no afirma found for this in the chart. And a left inferior pole cytology of Atypia of undetermined significance (Bethesda category III) , with benign afirma     She is S/P Total thyroidectomy  On 7/23rd/ 2021 with a 1.3 cm papillary thyroid  carcinoma on the left with no invasion, free margins and NO L.N submitted.   Thyroid  ultrasound showed a subcentimeter hypoechoic right thyroid  bed focus 0.8 cm by by March 2023 the patient developed a new 0.7 left thyroid  bed focus    She is s/p RAI remnant ablation with 53 mCi 07/2021   Posttreatment scan showed right thyroid  remnant but no distant metastasis on 08/27/2021   Thyroglobulin remains low at 0.1 NG/mL with undetectable thyroglobulin antibodies  SUBJECTIVE:    Today (11/17/2023):  Madison Hopkins is here for total thyroidectomy secondary to PTC.  She continues to follow-up with oncology for history of left breast cancer.  There was an order for a right breast cyst biopsy.  She continues to take anastrozole  since April, 2020, which will be discontinued after this prescription is over.  She has been tired as  she has been traveling around No local neck swelling  No palpitations  Has rare  tremors with exertion  NO Biotin  Has occasional changes in bowel movements depending on what she eats  She has sleeves for LUE lymphedema   Levothyroxine  100 mcg daily    HISTORY:  Past Medical History:  Past Medical History:  Diagnosis Date   Abnormal Pap smear of vagina 07/28/2016   LGSIL; colpo 07/2016 atrophic squamous cells; colpo 07/2017 - atypia   Allergy    Arthritis    Breast cancer (HCC) 2018   Metastatic Left breast   Elevated hemoglobin A1c 07/28/2016   level - 6.1   Endometriosis    GERD (gastroesophageal reflux disease)    HSV-2 infection    Iron deficiency anemia    Low vitamin D  level 07/28/2016   level 24.7   Osteoporosis 2024   forearm   Personal history of chemotherapy    finished 10/19   Personal history of radiation therapy    finished 03/2018   Thyroid  cancer (HCC)    Thyroid  neoplasm    Past Surgical History:  Past Surgical History:  Procedure Laterality Date   ABDOMINAL HYSTERECTOMY     BREAST BIOPSY Left 08/09/2017   BREAST LUMPECTOMY Left 01/22/2018   BREAST LUMPECTOMY WITH RADIOACTIVE SEED AND AXILLARY LYMPH NODE DISSECTION Left 01/22/2018   Procedure: LEFT BREAST LUMPECTOMY WITH RADIOACTIVE SEED AND COMPLETE LEFT AXILLARY LYMPH NODE  DISSECTION;  Surgeon: Gail Favorite, MD;  Location: [facility];  Service: General;  Laterality: Left;   CESAREAN SECTION     COLON SURGERY     COLOSTOMY     COLOSTOMY REVERSAL     FOOT SURGERY Right    done spur   OOPHORECTOMY     PORT-A-CATH REMOVAL Right 08/28/2018   Procedure: REMOVAL PORT-A-CATH;  Surgeon: Gail Favorite, MD;  Location: [facility];  Service: General;  Laterality: Right;   PORTACATH PLACEMENT N/A 09/06/2017   Procedure: INSERTION PORT-A-CATH;  Surgeon: Ethyl Lenis, MD;  Location: [facility];  Service: General;  Laterality: N/A;   SMALL INTESTINE SURGERY     SPINE SURGERY      Injections   THYROIDECTOMY N/A 09/20/2019   Procedure: TOTAL THYROIDECTOMY;  Surgeon: Eletha Boas, MD;  Location: [facility];  Service: General;  Laterality: N/A;   TUBAL LIGATION  1995   Social History:  reports that she has never smoked. She has never used smokeless tobacco. She reports current alcohol use of about 1.0 standard drink of alcohol per week. She reports that she does not currently use drugs after having used the following drugs: Marijuana. Family History:  Family History  Problem Relation Age of Onset   Mental illness Mother    Alcoholism Mother    Cirrhosis Mother    Alcohol abuse Mother    Cancer Father    Lung cancer Father      HOME MEDICATIONS: Allergies as of 11/17/2023       Reactions   Penicillins Nausea Only, Hives, Nausea And Vomiting   Has patient had a PCN reaction causing immediate rash, facial/tongue/throat swelling, SOB or lightheadedness with hypotension: No Has patient had a PCN reaction causing severe rash involving mucus membranes or skin necrosis: No Has patient had a PCN reaction that required hospitalization: No Has patient had a PCN reaction occurring within the last 10 years: Yes--nausea & headache ONLY If all of the above answers are NO, then may proceed with Cephalosporin use. Has patient had a PCN reaction causing immediate rash, facial/tongue/throat swelling, SOB or lightheadedness with hypotension: No Has patient had a PCN reaction causing severe rash involving mucus membranes or skin necrosis: No Has patient had a PCN reaction that required hospitalization: No Has patient had a PCN reaction occurring within the last 10 years: Yes--nausea & headache ONLY If all of the above answers are NO, then may proceed with Cephalosporin use. Has patient had a PCN reaction causing immediate rash, facial/tongue/throat swelling, SOB or lightheadedness with hypotension: No Has patient had a PCN reaction causing severe rash involving mucus membranes or  skin necrosis: No Has patient had a PCN reaction that required hospitalization: No Has patient had a PCN reaction occurring within the last 10 years: Yes--nausea & headache ONLY If all of the above answers are NO, then may proceed with Cephalosporin use.   Bee Venom    Tramadol  Nausea Only        Medication List        Accurate as of November 17, 2023 10:48 AM. If you have any questions, ask your nurse or doctor.          acyclovir  400 MG tablet Commonly known as: ZOVIRAX  Take 1 tablet (400 mg total) by mouth 2 (two) times daily.   ALIGN PO Take by mouth.   anastrozole  1 MG tablet Commonly known as: ARIMIDEX  TAKE 1 TABLET BY MOUTH EVERY DAY   benzonatate  100 MG capsule  Commonly known as: TESSALON  Take 1 capsule (100 mg total) by mouth every 8 (eight) hours.   BORIC ACID EX Apply topically.   fluconazole  150 MG tablet Commonly known as: DIFLUCAN  Take 1 tablet (150 mg total) by mouth every three (3) days as needed (yeast related to antibiotic therapy). Repeat if needed   fluticasone  50 MCG/ACT nasal spray Commonly known as: FLONASE  SPRAY 2 SPRAYS INTO EACH NOSTRIL EVERY DAY   ibuprofen  800 MG tablet Commonly known as: ADVIL  Take 800 mg by mouth 3 (three) times daily as needed.   ipratropium 0.03 % nasal spray Commonly known as: ATROVENT  Place 2 sprays into both nostrils every 12 (twelve) hours.   levothyroxine  100 MCG tablet Commonly known as: SYNTHROID  Take 1 tablet (100 mcg total) by mouth daily.   OPTI-TEARS OP Place 1 drop into both eyes 3 (three) times daily as needed (for dry/irritated eyes.).   promethazine -dextromethorphan 6.25-15 MG/5ML syrup Commonly known as: PROMETHAZINE -DM Take 5 mLs by mouth 3 (three) times daily as needed for cough.   Vitamin C 100 MG Chew Chew 100 mg by mouth daily.   VITAMIN D3 COMPLETE PO Take 1 tablet by mouth daily.          OBJECTIVE:   PHYSICAL EXAM: VS:BP 134/72 (BP Location: Right Arm, Patient  Position: Sitting, Cuff Size: Normal)   Pulse 62   Ht 5' 8 (1.727 m)   LMP  (LMP Unknown)   SpO2 98%   BMI 29.65 kg/m    EXAM: General: Pt appears well and is in NAD  Neck: General: Supple without adenopathy. Thyroid : Surgically removed . No nodules appreciated.  Lungs: Clear with good BS bilat   Heart: Auscultation: RRR.  Extremities:  BL LE: No pretibial edema  Mental Status: Judgment, insight: Intact Orientation: Oriented to time, place, and person Mood and affect: No depression, anxiety, or agitation     DATA REVIEWED:    Latest Reference Range & Units 11/17/23 11:24  TSH 0.40 - 4.50 mIU/L 2.75  T4,Free(Direct) 0.8 - 1.8 ng/dL 1.3     Latest Reference Range & Units 05/17/23 10:40  TSH 0.40 - 4.50 mIU/L 1.62     Latest Reference Range & Units 05/17/23 10:40  Thyroglobulin ng/mL 0.1 (L)  Thyroglobulin Ab < or = 1 IU/mL <1     Pathology report 09/20/2019 THYROID , TOTAL THYROIDECTOMY:  - Papillary thyroid  carcinoma, 1.3 cm.  - Margins not involved.  - Multiple adenomatous nodules.  - See oncology table.   ONCOLOGY TABLE:  THYROID  GLAND:  Procedure: Total thyroidectomy.  Tumor Focality: Unifocal.  Tumor Site: Left lobe, mid.  Tumor Size: 1.3 x 1 x 1 cm.  Histologic Type: Papillary thyroid  carcinoma.  Margins: Free of tumor.  Angioinvasion: Not identified.  Lymphatic Invasion: Not identified.  Extrathyroidal extension: Not identified.  Regional Lymph Nodes: No lymph nodes submitted.  Pathologic Stage Classification (pTNM, AJCC 8th Edition): pT1b, pNX   Thyroid  Ultrasound 11/28/2022  FINDINGS: The thyroid  gland is surgically absent. Sonographic evaluation of the resection bed demonstrates no evidence of residual or recurrent thyroid  tissue. No nodularity or adenopathy.   IMPRESSION: Surgical changes of total thyroidectomy without evidence of residual recurrent disease.      Post Rx WBS 08/27/2021    Thyroid  Ultrasound  05/22/2023 FINDINGS: Uptake at thyroid  remnant greater on RIGHT.   No additional sites of abnormal radio iodine accumulation identified.   Small amount of excreted tracer within urinary bladder.   IMPRESSION: Small thyroid  remnant.   No scintigraphic  evidence of iodine avid metastatic thyroid  cancer.   Similar appearing postsurgical changes after total thyroidectomy without evidence of local recurrence or regional lymphadenopathy.     ASSESSMENT / PLAN / RECOMMENDATIONS:   Hx of papillary Thyroid  carcinoma (pT1b, pNX) Stage I:     - S/P total thyroidectomy 09/20/2019 - TSH goal 0.5-2.0 uIU/mL  -Her previous thyroid  bed ultrasound shows a stable subcentimeter right thyroid  focus, by March 2023 and new subcentimeter left thyroid  bed focus has been noted - She is S/P remnant ablation 08/27/2021, with interval decrease in the size of residual tissue in the right thyroid  resection bed 10/2021 -Repeat thyroid  bed ultrasound 05/2022 revealed that the previously seen right thyroid  bed nodule was no longer identified -Repeat Ultrasound was stable 10/2022 - Will repeat thyroid  bed ultrasound    2. Post-operative Hypothyroidism      -She is clinically euthyroid  -TSH is slightly above range, I suspect this is due to recent increase in weight due to travel.  No changes at this time, we will continue to monitor   Medications  -Continue levothyroxine  100 mcg daily   F/U in 6 months   I spent 25 minutes preparing to see the patient by review of recent labs, imaging and procedures, obtaining and reviewing separately obtained history, communicating with the patient, ordering medications, tests or procedures, and documenting clinical information in the EHR including the differential Dx, treatment, and any further evaluation and other management   Signed electronically by: Stefano Redgie Butts, MD  [facility]  [facility] [address] [place], [place] [address] Phone: [phone] FAX: [phone]      CC: Job Lukes, [place] [address] [place] [address] Phone: [phone]  Fax: [phone]   Return to Endocrinology clinic as below: Future Appointments  Date Time Provider Department Center  11/17/2023 11:10 AM Madison Hopkins, Donell Redgie, MD [facility] None  01/12/2024  1:00 PM Job Lukes, PA [facility] [place]  01/16/2024  1:30 PM Cathlyn JAYSON Nikki Bobie FORBES, MD [facility] None  02/05/2024 12:20 PM [facility] MM 2 [facility]  06/24/2024 11:30 AM Loretha Ash, MD [facility] None  07/22/2024  1:00 PM [facility] ANNUAL WELLNESS VISIT 1 [facility] [place]

## 2023-11-17 NOTE — Patient Instructions (Signed)

## 2023-11-20 ENCOUNTER — Ambulatory Visit: Payer: Self-pay | Admitting: Internal Medicine

## 2023-11-20 LAB — THYROGLOBULIN LEVEL: Thyroglobulin: 0.1 ng/mL — ABNORMAL LOW

## 2023-11-20 LAB — T4, FREE: Free T4: 1.3 ng/dL (ref 0.8–1.8)

## 2023-11-20 LAB — THYROGLOBULIN ANTIBODY: Thyroglobulin Ab: <1 [IU]/mL (ref <=1)

## 2023-11-20 LAB — TSH: TSH: 2.75 m[IU]/L (ref 0.40–4.50)

## 2023-11-27 ENCOUNTER — Ambulatory Visit
Admission: RE | Admit: 2023-11-27 | Discharge: 2023-11-27 | Disposition: A | Source: Ambulatory Visit | Attending: Internal Medicine | Admitting: Internal Medicine

## 2023-11-27 DIAGNOSIS — Z85850 Personal history of malignant neoplasm of thyroid: Secondary | ICD-10-CM

## 2023-11-27 DIAGNOSIS — M5412 Radiculopathy, cervical region: Secondary | ICD-10-CM | POA: Diagnosis not present

## 2023-11-27 DIAGNOSIS — R590 Localized enlarged lymph nodes: Secondary | ICD-10-CM | POA: Diagnosis not present

## 2023-11-28 DIAGNOSIS — M5412 Radiculopathy, cervical region: Secondary | ICD-10-CM | POA: Diagnosis not present

## 2023-12-18 ENCOUNTER — Telehealth: Payer: Self-pay | Admitting: *Deleted

## 2023-12-18 NOTE — Telephone Encounter (Signed)
 Patient called wanting a complete listing of what she was diagnosed with and what treatment she had done.  States she is going to be doing a speech and wanted to use her story.  Mailed copy of last office note to the patient as it lists her entire diagnosis and treatment.

## 2023-12-25 ENCOUNTER — Encounter: Payer: Self-pay | Admitting: Physician Assistant

## 2023-12-25 ENCOUNTER — Ambulatory Visit (INDEPENDENT_AMBULATORY_CARE_PROVIDER_SITE_OTHER): Admitting: Physician Assistant

## 2023-12-25 VITALS — BP 120/68 | HR 69 | Temp 97.4°F | Ht 68.0 in | Wt 197.0 lb

## 2023-12-25 DIAGNOSIS — R051 Acute cough: Secondary | ICD-10-CM

## 2023-12-25 MED ORDER — BENZONATATE 100 MG PO CAPS: 100.0000 mg | ORAL_CAPSULE | Freq: Three times a day (TID) | ORAL | 0 refills | Status: DC

## 2023-12-25 MED ORDER — AZITHROMYCIN 250 MG PO TABS: ORAL_TABLET | ORAL | 0 refills | Status: AC

## 2023-12-25 MED ORDER — PROMETHAZINE-DM 6.25-15 MG/5ML PO SYRP: 5.0000 mL | ORAL_SOLUTION | Freq: Three times a day (TID) | ORAL | 0 refills | Status: DC | PRN

## 2023-12-25 NOTE — Progress Notes (Signed)
 Madison Hopkins is a 68 y.o. female here for a follow up of a pre-existing problem.  History of Present Illness:   Chief Complaint  Patient presents with   Cough    Pt c/o cough expectorating yellow sputum, nasal & chest congestion. X 1 week. Cough worse at night keeping her up. Home COVID test last week was Negative.    Discussed the use of AI scribe software for clinical note transcription with the patient, who gave verbal consent to proceed.  History of Present Illness   Madison Hopkins is a 68 year old female who presents with persistent cough and voice loss.  She has had a persistent cough and complete voice loss for the past week. The cough is productive with thick, deep yellow mucus and causes chest pain, particularly at night, leading to difficulty sleeping. She experiences headaches but denies fever or chills. A home COVID-19 test was negative. Tessalon  (benzonatate ) capsules previously provided some relief, but she has run out. She also uses cough syrup, which she forgot in her bedroom, contributing to difficulty sleeping in the guest bedroom.        Past Medical History:  Diagnosis Date   Abnormal Pap smear of vagina 07/28/2016   LGSIL; colpo 07/2016 atrophic squamous cells; colpo 07/2017 - atypia   Allergy    Arthritis    Breast cancer (HCC) 2018   Metastatic Left breast   Elevated hemoglobin A1c 07/28/2016   level - 6.1   Endometriosis    GERD (gastroesophageal reflux disease)    HSV-2 infection    Iron deficiency anemia    Low vitamin D  level 07/28/2016   level 24.7   Osteoporosis 2024   forearm   Personal history of chemotherapy    finished 10/19   Personal history of radiation therapy    finished 03/2018   Thyroid  cancer (HCC)    Thyroid  neoplasm      Social History   Tobacco Use   Smoking status: Never   Smokeless tobacco: Never  Vaping Use   Vaping status: Never Used  Substance Use Topics   Alcohol use: Yes    Alcohol/week: 1.0 standard drink of  alcohol    Types: 1 Glasses of wine per week    Comment: Social   Drug use: Not Currently    Types: Marijuana    Comment: everyday    Past Surgical History:  Procedure Laterality Date   ABDOMINAL HYSTERECTOMY     BREAST BIOPSY Left 08/09/2017   BREAST LUMPECTOMY Left 01/22/2018   BREAST LUMPECTOMY WITH RADIOACTIVE SEED AND AXILLARY LYMPH NODE DISSECTION Left 01/22/2018   Procedure: LEFT BREAST LUMPECTOMY WITH RADIOACTIVE SEED AND COMPLETE LEFT AXILLARY LYMPH NODE DISSECTION;  Surgeon: Gail Favorite, MD;  Location: [facility];  Service: General;  Laterality: Left;   CESAREAN SECTION     COLON SURGERY     COLOSTOMY     COLOSTOMY REVERSAL     FOOT SURGERY Right    done spur   OOPHORECTOMY     PORT-A-CATH REMOVAL Right 08/28/2018   Procedure: REMOVAL PORT-A-CATH;  Surgeon: Gail Favorite, MD;  Location: [facility];  Service: General;  Laterality: Right;   PORTACATH PLACEMENT N/A 09/06/2017   Procedure: INSERTION PORT-A-CATH;  Surgeon: Ethyl Lenis, MD;  Location: [facility];  Service: General;  Laterality: N/A;   SMALL INTESTINE SURGERY     SPINE SURGERY     Injections   THYROIDECTOMY N/A 09/20/2019   Procedure: TOTAL THYROIDECTOMY;  Surgeon: Eletha Boas, MD;  Location: [facility];  Service: General;  Laterality: N/A;   TUBAL LIGATION  1995    Family History  Problem Relation Age of Onset   Mental illness Mother    Alcoholism Mother    Cirrhosis Mother    Alcohol abuse Mother    Cancer Father    Lung cancer Father     Allergies  Allergen Reactions   Penicillins Nausea Only, Hives and Nausea And Vomiting    Has patient had a PCN reaction causing immediate rash, facial/tongue/throat swelling, SOB or lightheadedness with hypotension: No Has patient had a PCN reaction causing severe rash involving mucus membranes or skin necrosis: No Has patient had a PCN reaction that required hospitalization: No Has patient had a PCN reaction occurring within the  last 10 years: Yes--nausea & headache ONLY If all of the above answers are NO, then may proceed with Cephalosporin use.  Has patient had a PCN reaction causing immediate rash, facial/tongue/throat swelling, SOB or lightheadedness with hypotension: No Has patient had a PCN reaction causing severe rash involving mucus membranes or skin necrosis: No Has patient had a PCN reaction that required hospitalization: No Has patient had a PCN reaction occurring within the last 10 years: Yes--nausea & headache ONLY If all of the above answers are NO, then may proceed with Cephalosporin use. Has patient had a PCN reaction causing immediate rash, facial/tongue/throat swelling, SOB or lightheadedness with hypotension: No Has patient had a PCN reaction causing severe rash involving mucus membranes or skin necrosis: No Has patient had a PCN reaction that required hospitalization: No Has patient had a PCN reaction occurring within the last 10 years: Yes--nausea & headache ONLY If all of the above answers are NO, then may proceed with Cephalosporin use.   Bee Venom    Tramadol  Nausea Only    Current Medications:   Current Outpatient Medications:    acyclovir  (ZOVIRAX ) 400 MG tablet, Take 1 tablet (400 mg total) by mouth 2 (two) times daily. (Patient taking differently: Take 400 mg by mouth as needed.), Disp: 180 tablet, Rfl: 1   Artificial Tear Solution (OPTI-TEARS OP), Place 1 drop into both eyes 3 (three) times daily as needed (for dry/irritated eyes.)., Disp: , Rfl:    Ascorbic Acid (VITAMIN C) 100 MG CHEW, Chew 100 mg by mouth daily. , Disp: , Rfl:    benzonatate  (TESSALON ) 100 MG capsule, Take 1 capsule (100 mg total) by mouth every 8 (eight) hours., Disp: 21 capsule, Rfl: 0   BORIC ACID EX, Apply topically. , Disp: , Rfl:    cyclobenzaprine (FLEXERIL) 10 MG tablet, Take 10 mg by mouth as needed., Disp: , Rfl:    fluconazole  (DIFLUCAN ) 150 MG tablet, Take 1 tablet (150 mg total) by mouth every  three (3) days as needed (yeast related to antibiotic therapy). Repeat if needed, Disp: 2 tablet, Rfl: 0   fluticasone  (FLONASE ) 50 MCG/ACT nasal spray, SPRAY 2 SPRAYS INTO EACH NOSTRIL EVERY DAY, Disp: 16 mL, Rfl: 2   ibuprofen  (ADVIL ) 800 MG tablet, Take 800 mg by mouth 3 (three) times daily as needed., Disp: , Rfl:    ipratropium (ATROVENT ) 0.03 % nasal spray, Place 2 sprays into both nostrils every 12 (twelve) hours., Disp: 30 mL, Rfl: 12   levothyroxine  (SYNTHROID ) 100 MCG tablet, Take 1 tablet (100 mcg total) by mouth daily., Disp: 90 tablet, Rfl: 3   Multiple Vitamins-Minerals (VITAMIN D3 COMPLETE PO), Take 1 tablet by mouth daily. , Disp: , Rfl:  Probiotic Product (ALIGN PO), Take by mouth. , Disp: , Rfl:    Review of Systems:   Negative unless otherwise specified per HPI.  Vitals:   Vitals:   12/25/23 1141  BP: 120/68  Pulse: 69  Temp: (!) 97.4 F (36.3 C)  TempSrc: Temporal  SpO2: 96%  Weight: 197 lb (89.4 kg)  Height: 5' 8 (1.727 m)     Body mass index is 29.95 kg/m.  Physical Exam:   Physical Exam Vitals and nursing note reviewed.  Constitutional:      General: She is not in acute distress.    Appearance: She is well-developed. She is not ill-appearing or toxic-appearing.  HENT:     Head: Normocephalic and atraumatic.     Right Ear: Tympanic membrane, ear canal and external ear normal. Tympanic membrane is not erythematous, retracted or bulging.     Left Ear: Tympanic membrane, ear canal and external ear normal. Tympanic membrane is not erythematous, retracted or bulging.     Nose: Nose normal.     Right Sinus: No maxillary sinus tenderness or frontal sinus tenderness.     Left Sinus: No maxillary sinus tenderness or frontal sinus tenderness.     Mouth/Throat:     Pharynx: Uvula midline. No posterior oropharyngeal erythema.  Eyes:     General: Lids are normal.     Conjunctiva/sclera: Conjunctivae normal.  Neck:     Trachea: Trachea normal.   Cardiovascular:     Rate and Rhythm: Normal rate and regular rhythm.     Heart sounds: Normal heart sounds, S1 normal and S2 normal.  Pulmonary:     Effort: Pulmonary effort is normal.     Breath sounds: Normal breath sounds. No decreased breath sounds, wheezing, rhonchi or rales.  Lymphadenopathy:     Cervical: No cervical adenopathy.  Skin:    General: Skin is warm and dry.  Neurological:     Mental Status: She is alert.  Psychiatric:        Speech: Speech normal.        Behavior: Behavior normal. Behavior is cooperative.     Assessment and Plan:   Assessment and Plan    Acute cough Persistent cough with thick yellow sputum, chest pain from coughing, and difficulty sleeping. Negative COVID-19 test. Symptoms unresponsive to OTC treatments. - Initiated antibiotic therapy, z-pack - Prescribed Tessalon  (benzonatate ) for cough. - Prescribed cough syrup for symptomatic relief.      Follow up if no improvement or any changes    Lucie Buttner, PA-C

## 2024-01-12 ENCOUNTER — Encounter: Payer: Self-pay | Admitting: Physician Assistant

## 2024-01-12 ENCOUNTER — Ambulatory Visit: Admitting: Physician Assistant

## 2024-01-12 VITALS — BP 122/80 | HR 63 | Temp 98.0°F | Ht 69.5 in | Wt 200.4 lb

## 2024-01-12 DIAGNOSIS — L309 Dermatitis, unspecified: Secondary | ICD-10-CM | POA: Diagnosis not present

## 2024-01-12 DIAGNOSIS — R739 Hyperglycemia, unspecified: Secondary | ICD-10-CM | POA: Diagnosis not present

## 2024-01-12 DIAGNOSIS — Z23 Encounter for immunization: Secondary | ICD-10-CM

## 2024-01-12 DIAGNOSIS — R350 Frequency of micturition: Secondary | ICD-10-CM

## 2024-01-12 DIAGNOSIS — Z1211 Encounter for screening for malignant neoplasm of colon: Secondary | ICD-10-CM | POA: Diagnosis not present

## 2024-01-12 DIAGNOSIS — E663 Overweight: Secondary | ICD-10-CM

## 2024-01-12 DIAGNOSIS — Z0000 Encounter for general adult medical examination without abnormal findings: Secondary | ICD-10-CM

## 2024-01-12 LAB — COMPREHENSIVE METABOLIC PANEL WITH GFR
ALT: 24 U/L (ref 0–35)
AST: 22 U/L (ref 0–37)
Albumin: 4.4 g/dL (ref 3.5–5.2)
Alkaline Phosphatase: 102 U/L (ref 39–117)
BUN: 9 mg/dL (ref 6–23)
CO2: 25 meq/L (ref 19–32)
Calcium: 9.8 mg/dL (ref 8.4–10.5)
Chloride: 105 meq/L (ref 96–112)
Creatinine, Ser: 0.83 mg/dL (ref 0.40–1.20)
GFR: 72.37 mL/min (ref >60.00)
Glucose, Bld: 86 mg/dL (ref 70–99)
Potassium: 3.7 meq/L (ref 3.5–5.1)
Sodium: 138 meq/L (ref 135–145)
Total Bilirubin: 0.6 mg/dL (ref 0.2–1.2)
Total Protein: 7.4 g/dL (ref 6.0–8.3)

## 2024-01-12 LAB — CBC WITH DIFFERENTIAL/PLATELET
Basophils Absolute: 0.1 K/uL (ref 0.0–0.1)
Basophils Relative: 0.8 % (ref 0.0–3.0)
Eosinophils Absolute: 0.2 K/uL (ref 0.0–0.7)
Eosinophils Relative: 2.5 % (ref 0.0–5.0)
HCT: 37.8 % (ref 36.0–46.0)
Hemoglobin: 12.3 g/dL (ref 12.0–15.0)
Lymphocytes Relative: 31.2 % (ref 12.0–46.0)
Lymphs Abs: 2.2 K/uL (ref 0.7–4.0)
MCHC: 32.5 g/dL (ref 30.0–36.0)
MCV: 83.4 fl (ref 78.0–100.0)
Monocytes Absolute: 0.4 K/uL (ref 0.1–1.0)
Monocytes Relative: 6 % (ref 3.0–12.0)
Neutro Abs: 4.2 K/uL (ref 1.4–7.7)
Neutrophils Relative %: 59.5 % (ref 43.0–77.0)
Platelets: 302 K/uL (ref 150.0–400.0)
RBC: 4.53 Mil/uL (ref 3.87–5.11)
RDW: 14.7 % (ref 11.5–15.5)
WBC: 7.1 K/uL (ref 4.0–10.5)

## 2024-01-12 LAB — POCT URINALYSIS DIPSTICK
Bilirubin, UA: NEGATIVE
Blood, UA: NEGATIVE
Glucose, UA: NEGATIVE
Ketones, UA: NEGATIVE
Leukocytes, UA: NEGATIVE
Nitrite, UA: NEGATIVE
Protein, UA: NEGATIVE
Spec Grav, UA: <=1.005 — AB (ref 1.010–1.025)
Urobilinogen, UA: 0.2 U/dL
pH, UA: 7 (ref 5.0–8.0)

## 2024-01-12 LAB — LIPID PANEL
Cholesterol: 194 mg/dL (ref 0–200)
HDL: 45 mg/dL (ref >39.00)
LDL Cholesterol: 127 mg/dL — ABNORMAL HIGH (ref 0–99)
NonHDL: 148.88
Total CHOL/HDL Ratio: 4
Triglycerides: 111 mg/dL (ref 0.0–149.0)
VLDL: 22.2 mg/dL (ref 0.0–40.0)

## 2024-01-12 MED ORDER — TACROLIMUS 0.1 % EX OINT: TOPICAL_OINTMENT | Freq: Two times a day (BID) | CUTANEOUS | 0 refills | Status: AC

## 2024-01-12 NOTE — Telephone Encounter (Signed)
 Please advise on another treatment plan is that financially friendly for patient.  Please and thank you!

## 2024-01-12 NOTE — Patient Instructions (Addendum)
 Try the protopic ointment to see if this helps, if not, try generic OTC (available over the counter without a prescription) hydrocortisone  cream

## 2024-01-12 NOTE — Progress Notes (Signed)
 Subjective:    Madison Hopkins is a 68 y.o. female and is here for a comprehensive physical exam.  HPI  Health Maintenance Due  Topic Date Due   Fecal DNA (Cologuard)  01/21/2023   Mammogram  02/03/2024    Discussed the use of AI scribe software for clinical note transcription with the patient, who gave verbal consent to proceed.  History of Present Illness   Madison Hopkins is a 68 year old female who presents with persistent perianal and vaginal irritation.  She experiences persistent irritation in the vaginal and perianal areas, which worsens with diarrhea. Aquaphor is used daily without relief. There is no itchiness, but significant irritation occurs, especially at night.  She has a history of breast cancer and was on an estrogen blocker, which she associates with vaginal dryness and changes in her sex life. Vaginal dryness began after starting the medication.  She has a history of endometriosis, diagnosed after complications during childbirth, including a ruptured intestine that required a temporary colostomy bag. She experiences cramping in the lower abdomen and vaginal area, using ibuprofen  for discomfort.  She is sensitive to various products and has tried multiple remedies, including Epsom salt baths and probiotics, to alleviate her symptoms. She is cautious about the products she uses, including toilet paper and wipes, due to her sensitivity.      Health Maintenance: Immunizations -- UpToDate  Colonoscopy -- Cologuard due - will order Mammogram -- UpToDate  PAP -- n/a Bone Density -- UpToDate  Diet -- regular diet Exercise -- as able  Sleep habits -- no major concerns Mood -- stable  UTD with dentist? - yes UTD with eye doctor? - yes  Weight history: Wt Readings from Last 10 Encounters:  01/12/24 200 lb 6.1 oz (90.9 kg)  12/25/23 197 lb (89.4 kg)  09/20/23 195 lb (88.5 kg)  07/11/23 196 lb (88.9 kg)  06/22/23 196 lb 14.4 oz (89.3 kg)  05/17/23 200 lb 12.8  oz (91.1 kg)  03/14/23 199 lb 1.2 oz (90.3 kg)  02/08/23 199 lb (90.3 kg)  01/11/23 199 lb (90.3 kg)  12/23/22 196 lb (88.9 kg)   Body mass index is 29.17 kg/m. No LMP recorded (lmp unknown). Patient has had a hysterectomy.  Alcohol use:  reports current alcohol use of about 1.0 standard drink of alcohol per week.  Tobacco use:  Tobacco Use: Low Risk  (01/12/2024)   Patient History    Smoking Tobacco Use: Never    Smokeless Tobacco Use: Never    Passive Exposure: Not on file   Eligible for lung cancer screening? no     01/12/2024   10:18 AM  Depression screen PHQ 2/9  Decreased Interest 0  Down, Depressed, Hopeless 0  PHQ - 2 Score 0     Other providers/specialists: Patient Care Team: Job Lukes, [place] as PCP - General (Physician Assistant) Cathlyn JAYSON Nikki Bobie FORBES, MD as Consulting Physician (Obstetrics and Gynecology) Bensimhon, Toribio SAUNDERS, MD as Consulting Physician (Cardiology) Cesario Boer, MD as Attending Physician (Physical Medicine and Rehabilitation) [facility], Donell Cardinal, MD as Consulting Physician (Endocrinology) Aron Shoulders, MD as Consulting Physician (General Surgery)    PMHx, SurgHx, SocialHx, Medications, and Allergies were reviewed in the Visit Navigator and updated as appropriate.   Past Medical History:  Diagnosis Date   Abnormal Pap smear of vagina 07/28/2016   LGSIL; colpo 07/2016 atrophic squamous cells; colpo 07/2017 - atypia   Allergy    Arthritis    Breast cancer (HCC)  2018   Metastatic Left breast   Elevated hemoglobin A1c 07/28/2016   level - 6.1   Endometriosis    GERD (gastroesophageal reflux disease)    HSV-2 infection    Iron deficiency anemia    Low vitamin D  level 07/28/2016   level 24.7   Neuromuscular disorder (HCC)    Osteoporosis 2024   forearm   Personal history of chemotherapy    finished 10/19   Personal history of radiation therapy    finished 03/2018   Thyroid  cancer (HCC)    Thyroid  neoplasm      Past  Surgical History:  Procedure Laterality Date   ABDOMINAL HYSTERECTOMY     BREAST BIOPSY Left 08/09/2017   BREAST LUMPECTOMY Left 01/22/2018   BREAST LUMPECTOMY WITH RADIOACTIVE SEED AND AXILLARY LYMPH NODE DISSECTION Left 01/22/2018   Procedure: LEFT BREAST LUMPECTOMY WITH RADIOACTIVE SEED AND COMPLETE LEFT AXILLARY LYMPH NODE DISSECTION;  Surgeon: Gail Favorite, MD;  Location: [facility];  Service: General;  Laterality: Left;   CESAREAN SECTION     COLON SURGERY     COLOSTOMY     COLOSTOMY REVERSAL     FOOT SURGERY Right    done spur   OOPHORECTOMY     PORT-A-CATH REMOVAL Right 08/28/2018   Procedure: REMOVAL PORT-A-CATH;  Surgeon: Gail Favorite, MD;  Location: [facility];  Service: General;  Laterality: Right;   PORTACATH PLACEMENT N/A 09/06/2017   Procedure: INSERTION PORT-A-CATH;  Surgeon: Ethyl Lenis, MD;  Location: [facility] OR;  Service: General;  Laterality: N/A;   SMALL INTESTINE SURGERY     SPINE SURGERY     Injections   THYROIDECTOMY N/A 09/20/2019   Procedure: TOTAL THYROIDECTOMY;  Surgeon: Eletha Boas, MD;  Location: [facility];  Service: General;  Laterality: N/A;   TUBAL LIGATION  1995     Family History  Problem Relation Age of Onset   Mental illness Mother    Alcoholism Mother    Cirrhosis Mother    Alcohol abuse Mother    Cancer Father    Lung cancer Father     Social History   Tobacco Use   Smoking status: Never   Smokeless tobacco: Never  Vaping Use   Vaping status: Never Used  Substance Use Topics   Alcohol use: Yes    Alcohol/week: 1.0 standard drink of alcohol    Types: 1 Glasses of wine per week    Comment: Social   Drug use: Yes    Types: Marijuana    Comment: everyday    Review of Systems:   Review of Systems  Constitutional:  Negative for chills, fever, malaise/fatigue and weight loss.  HENT:  Negative for hearing loss, sinus pain and sore throat.   Respiratory:  Negative for cough and hemoptysis.    Cardiovascular:  Negative for chest pain, palpitations, leg swelling and PND.  Gastrointestinal:  Negative for abdominal pain, constipation, diarrhea, heartburn, nausea and vomiting.  Genitourinary:  Negative for dysuria, frequency and urgency.  Musculoskeletal:  Negative for back pain, myalgias and neck pain.  Skin:  Positive for rash. Negative for itching.  Neurological:  Negative for dizziness, tingling, seizures and headaches.  Endo/Heme/Allergies:  Negative for polydipsia.  Psychiatric/Behavioral:  Negative for depression. The patient is not nervous/anxious.     Objective:   BP 122/80 (BP Location: Right Arm, Patient Position: Sitting, Cuff Size: Large)   Pulse 63   Temp 98 F (36.7 C) (Temporal)   Ht 5' 9.5 (1.765 m)  Wt 200 lb 6.1 oz (90.9 kg)   LMP  (LMP Unknown)   SpO2 97%   BMI 29.17 kg/m  Body mass index is 29.17 kg/m.   General Appearance:    Alert, cooperative, no distress, appears stated age  Head:    Normocephalic, without obvious abnormality, atraumatic  Eyes:    PERRL, conjunctiva/corneas clear, EOM's intact, fundi    benign, both eyes  Ears:    Normal TM's and external ear canals, both ears  Nose:   Nares normal, septum midline, mucosa normal, no drainage    or sinus tenderness  Throat:   Lips, mucosa, and tongue normal; teeth and gums normal  Neck:   Supple, symmetrical, trachea midline, no adenopathy;    thyroid :  no enlargement/tenderness/nodules; no carotid   bruit or JVD  Back:     Symmetric, no curvature, ROM normal, no CVA tenderness  Lungs:     Clear to auscultation bilaterally, respirations unlabored  Chest Wall:    No tenderness or deformity   Heart:    Regular rate and rhythm, S1 and S2 normal, no murmur, rub or gallop  Breast Exam:    Deferred  Abdomen:     Soft, non-tender, bowel sounds active all four quadrants,    no masses, no organomegaly  Genitalia:    Deferred   Extremities:   Extremities normal, atraumatic, no cyanosis or edema   Pulses:   2+ and symmetric all extremities  Skin:   Skin color, texture, turgor normal, no rashes or lesions  Lymph nodes:   Cervical, supraclavicular, and axillary nodes normal  Neurologic:   CNII-XII intact, normal strength, sensation and reflexes    throughout    Assessment/Plan:   Assessment and Plan    Adult Wellness Visit Routine wellness visit with focus on health maintenance. - Ordered blood work including A1c. - Encouraged regular exercise and healthy lifestyle.  Perianal dermatitis Chronic dermatitis with ineffective previous hydrocortisone  treatment. Discussed non-steroidal alternatives due to insurance issues. - Prescribed Protopic ointment if covered by insurance. - Recommended over-the-counter 1% hydrocortisone  cream if Protopic is not covered. - Advised application of cream twice daily. - Suggested using a bidet to reduce irritation.  Colorectal cancer screening Due for screening, previous use of Cologuard. - Ordered Cologuard test.  Overweight Discussed in context of health and wellness. - Encouraged regular exercise.  Hyperglycemia Previous A1c in diabetic range, recent improvement noted. - Ordered A1c test to monitor blood glucose levels.       Lucie Buttner, PA-C [place] [facility]

## 2024-01-12 NOTE — Telephone Encounter (Signed)
 Dr. Wendolyn, please see message and advise. Thanks.

## 2024-01-13 LAB — URINE CULTURE
MICRO NUMBER:: 17237228
SPECIMEN QUALITY:: ADEQUATE

## 2024-01-15 ENCOUNTER — Encounter: Payer: Self-pay | Admitting: Physician Assistant

## 2024-01-15 ENCOUNTER — Other Ambulatory Visit: Payer: Self-pay | Admitting: Physician Assistant

## 2024-01-15 ENCOUNTER — Ambulatory Visit: Payer: Self-pay | Admitting: Physician Assistant

## 2024-01-15 DIAGNOSIS — R739 Hyperglycemia, unspecified: Secondary | ICD-10-CM

## 2024-01-15 LAB — HEMOGLOBIN A1C: Hgb A1c MFr Bld: 6.6 % — ABNORMAL HIGH (ref 4.6–6.5)

## 2024-01-15 MED ORDER — FLUCONAZOLE 150 MG PO TABS: 150.0000 mg | ORAL_TABLET | ORAL | 0 refills | Status: AC | PRN

## 2024-01-15 MED ORDER — NYSTATIN-TRIAMCINOLONE 100000-0.1 UNIT/GM-% EX OINT: 1.0000 | TOPICAL_OINTMENT | Freq: Two times a day (BID) | CUTANEOUS | 0 refills | Status: AC

## 2024-01-16 ENCOUNTER — Other Ambulatory Visit (HOSPITAL_COMMUNITY)
Admission: RE | Admit: 2024-01-16 | Discharge: 2024-01-16 | Disposition: A | Source: Ambulatory Visit | Attending: Obstetrics and Gynecology | Admitting: Obstetrics and Gynecology

## 2024-01-16 ENCOUNTER — Ambulatory Visit: Payer: PPO | Admitting: Obstetrics and Gynecology

## 2024-01-16 ENCOUNTER — Encounter: Payer: Self-pay | Admitting: Obstetrics and Gynecology

## 2024-01-16 VITALS — BP 124/84 | HR 71 | Ht 69.25 in | Wt 199.0 lb

## 2024-01-16 DIAGNOSIS — M810 Age-related osteoporosis without current pathological fracture: Secondary | ICD-10-CM | POA: Diagnosis not present

## 2024-01-16 DIAGNOSIS — Z9289 Personal history of other medical treatment: Secondary | ICD-10-CM

## 2024-01-16 DIAGNOSIS — Z1272 Encounter for screening for malignant neoplasm of vagina: Secondary | ICD-10-CM | POA: Diagnosis not present

## 2024-01-16 DIAGNOSIS — B009 Herpesviral infection, unspecified: Secondary | ICD-10-CM | POA: Diagnosis not present

## 2024-01-16 DIAGNOSIS — Z1159 Encounter for screening for other viral diseases: Secondary | ICD-10-CM | POA: Diagnosis not present

## 2024-01-16 DIAGNOSIS — Z113 Encounter for screening for infections with a predominantly sexual mode of transmission: Secondary | ICD-10-CM

## 2024-01-16 DIAGNOSIS — Z9189 Other specified personal risk factors, not elsewhere classified: Secondary | ICD-10-CM

## 2024-01-16 DIAGNOSIS — N761 Subacute and chronic vaginitis: Secondary | ICD-10-CM | POA: Insufficient documentation

## 2024-01-16 DIAGNOSIS — Z114 Encounter for screening for human immunodeficiency virus [HIV]: Secondary | ICD-10-CM

## 2024-01-16 DIAGNOSIS — Z01419 Encounter for gynecological examination (general) (routine) without abnormal findings: Secondary | ICD-10-CM

## 2024-01-16 NOTE — Progress Notes (Unsigned)
 68 y.o. G2P0002 Single African American female here for a breast and pelvic exam.  Still having vaginal irritation. Has stopped taking her anastrozole .   Hx DIV treated with compounded hydrocortisone  vaginal cream 3 grams pv at hs for 4 weeks as needed.  The patient is also followed for HSV, VAIN.  Neg GC/CT/trichomonas on 09/20/23.  Wants serum STD screening.     A1C 6.6.    Received her flu vaccine and she plans to do her Shingrix.   PCP: Job Lukes, PA   No LMP recorded (lmp unknown). Patient has had a hysterectomy.           Sexually active: No.  The current method of family planning is status post hysterectomy and post menopausal status.    Menopausal hormone therapy:  n/a Exercising: Yes.    Walking  Smoker:  yes  OB History     Gravida  2   Para  2   Term  0   Preterm  0   AB  0   Living  2      SAB  0   IAB  0   Ectopic  0   Multiple  0   Live Births  0           HEALTH MAINTENANCE: Last 2 paps: 11/26/20 neg HR HPV neg, 10/31/19 neg HR HPV neg  History of abnormal Pap or positive HPV:  yes, 08-17-17 colpo showing mild atypia of vagina; 08-10-17 ASCUS:Neg HR HPV.  09/09/16 colpo biopsy showing atrophic squamous cells (menopausal change); 07/28/16 LGSIL  Mammogram:  02/24/23 R Breast - Breast Density Cat B, BIRADS Cat 3 prob benign  Colonoscopy:  cologuard 4 years ago per pt  Plans to do another Cologuard.   Bone Density:  03/02/22  Result  osteoporotic    Immunization History  Administered Date(s) Administered   Fluad Quad(high Dose 65+) 01/31/2022   Fluad Trivalent(High Dose 65+) 11/02/2022   INFLUENZA, HIGH DOSE SEASONAL PF 01/12/2024   Influenza,inj,Quad PF,6+ Mos 01/14/2020   PFIZER Comirnaty(Gray Top)Covid-19 Tri-Sucrose Vaccine 10/22/2020   PFIZER(Purple Top)SARS-COV-2 Vaccination 07/01/2019, 07/22/2019, 02/16/2020   PNEUMOCOCCAL CONJUGATE-20 01/31/2022   Pneumococcal Polysaccharide-23 07/28/2016   Tdap 10/23/2018      reports  that she has never smoked. She has never used smokeless tobacco. She reports current alcohol use of about 1.0 standard drink of alcohol per week. She reports current drug use. Drug: Marijuana.  Past Medical History:  Diagnosis Date   Abnormal Pap smear of vagina 07/28/2016   LGSIL; colpo 07/2016 atrophic squamous cells; colpo 07/2017 - atypia   Allergy    Arthritis    Breast cancer (HCC) 2018   Metastatic Left breast   Elevated hemoglobin A1c 07/28/2016   level - 6.1   Endometriosis    GERD (gastroesophageal reflux disease)    Glaucoma    HSV-2 infection    Iron deficiency anemia    Low vitamin D  level 07/28/2016   level 24.7   Neuromuscular disorder (HCC)    Osteoporosis 2024   forearm   Personal history of chemotherapy    finished 10/19   Personal history of radiation therapy    finished 03/2018   Thyroid  cancer (HCC)    Thyroid  neoplasm     Past Surgical History:  Procedure Laterality Date   ABDOMINAL HYSTERECTOMY     BREAST BIOPSY Left 08/09/2017   BREAST LUMPECTOMY Left 01/22/2018   BREAST LUMPECTOMY WITH RADIOACTIVE SEED AND AXILLARY LYMPH NODE DISSECTION Left 01/22/2018  Procedure: LEFT BREAST LUMPECTOMY WITH RADIOACTIVE SEED AND COMPLETE LEFT AXILLARY LYMPH NODE DISSECTION;  Surgeon: Gail Favorite, MD;  Location: [facility];  Service: General;  Laterality: Left;   CESAREAN SECTION     COLON SURGERY     COLOSTOMY     COLOSTOMY REVERSAL     FOOT SURGERY Right    done spur   OOPHORECTOMY     PORT-A-CATH REMOVAL Right 08/28/2018   Procedure: REMOVAL PORT-A-CATH;  Surgeon: Gail Favorite, MD;  Location: [facility];  Service: General;  Laterality: Right;   PORTACATH PLACEMENT N/A 09/06/2017   Procedure: INSERTION PORT-A-CATH;  Surgeon: Ethyl Lenis, MD;  Location: [facility];  Service: General;  Laterality: N/A;   SMALL INTESTINE SURGERY     SPINE SURGERY     Injections   THYROIDECTOMY N/A 09/20/2019   Procedure: TOTAL THYROIDECTOMY;   Surgeon: Eletha Boas, MD;  Location: [facility];  Service: General;  Laterality: N/A;   TUBAL LIGATION  1995    Current Outpatient Medications  Medication Sig Dispense Refill   acyclovir  (ZOVIRAX ) 400 MG tablet Take 1 tablet (400 mg total) by mouth 2 (two) times daily. (Patient taking differently: Take 400 mg by mouth as needed.) 180 tablet 1   Artificial Tear Solution (OPTI-TEARS OP) Place 1 drop into both eyes 3 (three) times daily as needed (for dry/irritated eyes.).     Ascorbic Acid (VITAMIN C) 100 MG CHEW Chew 100 mg by mouth daily.      BORIC ACID EX Apply topically.      cyclobenzaprine (FLEXERIL) 10 MG tablet Take 10 mg by mouth as needed.     fluconazole  (DIFLUCAN ) 150 MG tablet Take 1 tablet (150 mg total) by mouth every three (3) days as needed (yeast related to antibiotic therapy). Repeat if needed 2 tablet 0   ibuprofen  (ADVIL ) 800 MG tablet Take 800 mg by mouth 3 (three) times daily as needed.     ipratropium (ATROVENT ) 0.03 % nasal spray Place 2 sprays into both nostrils every 12 (twelve) hours. 30 mL 12   levothyroxine  (SYNTHROID ) 100 MCG tablet Take 1 tablet (100 mcg total) by mouth daily. 90 tablet 3   Multiple Vitamins-Minerals (VITAMIN D3 COMPLETE PO) Take 1 tablet by mouth daily.      nystatin-triamcinolone ointment (MYCOLOG) Apply 1 Application topically 2 (two) times daily. 30 g 0   Probiotic Product (ALIGN PO) Take by mouth.      tacrolimus (PROTOPIC) 0.1 % ointment Apply topically 2 (two) times daily. 100 g 0   No current facility-administered medications for this visit.    ALLERGIES: Penicillins, Bee venom, and Tramadol   Family History  Problem Relation Age of Onset   Mental illness Mother    Alcoholism Mother    Cirrhosis Mother    Alcohol abuse Mother    Cancer Father    Lung cancer Father     Review of Systems  All other systems reviewed and are negative.   PHYSICAL EXAM:  BP 124/84 (BP Location: Left Arm, Patient Position: Sitting)   Pulse 71   Ht  5' 9.25 (1.759 m)   Wt 199 lb (90.3 kg)   LMP  (LMP Unknown)   SpO2 97%   BMI 29.18 kg/m     General appearance: alert, cooperative and appears stated age Head: normocephalic, without obvious abnormality, atraumatic Neck: no adenopathy, supple, symmetrical, trachea midline and thyroid  normal to inspection and palpation Lungs: clear to auscultation bilaterally Breasts: normal appearance, no masses or  tenderness, No nipple retraction or dimpling, No nipple discharge or bleeding, No axillary adenopathy Heart: regular rate and rhythm Abdomen: soft, non-tender; no masses, no organomegaly Extremities: extremities normal, atraumatic, no cyanosis or edema Skin: skin color, texture, turgor normal. No rashes or lesions Lymph nodes: cervical, supraclavicular, and axillary nodes normal. Neurologic: grossly normal  Pelvic: External genitalia:  grey coloration of the introitus, creamy discharge.                No abnormal inguinal nodes palpated.              Urethra:  normal appearing urethra with no masses, tenderness or lesions              Bartholins and Skenes: normal                 Vagina: normal appearing vagina with normal color and discharge, no lesions              Cervix: no lesions              Pap taken: yes Bimanual Exam:  Uterus:  normal size, contour, position, consistency, mobility, non-tender              Adnexa: no mass, fullness, tenderness              Rectal exam: yes.  Confirms.              Anus:  normal sphincter tone, no lesions  Chaperone was present for exam:  {BSCHAPERONE:[id number]::Emily F, CMA}  ASSESSMENT: Encounter for breast and pelvic exam.  Personal history of other medical treatment.  Status post TAH/BSO/appy.  Vaginitis.  I suspect patient does have a combination of atrophy and desquamative vaginitis.  Hx metastatic left breast cancer. Off Arimidex .  Hx VAIN I.  Hx HSV II.  On Acyclovir  as needed.    Hx thyroid  cancer.   Status post  thyroidectomy. Osteoporosis of forearm.  Not treated.  DM.   ***  PLAN: Mammogram screening discussed. Self breast awareness reviewed. Pap and HRV collected:  yes.   Vaginitis screening.  Guidelines for Calcium , Vitamin D , regular exercise program including cardiovascular and weight bearing exercise. Medication refills:  NA She already had Diflucan  Rx for Mycolog II Serum STD screening.   Dexa.   Follow up:  yearly and prn.      Additional counseling given.  {yes [id number]. ***  total time was spent for this patient encounter, including preparation, face-to-face counseling with the patient, coordination of care, and documentation of the encounter in addition to doing the breast and pelvic exam.

## 2024-01-16 NOTE — Patient Instructions (Signed)
 Phone number for scheduling bone density at [facility], [phone].    EXERCISE AND DIET:  We recommended that you start or continue a regular exercise program for good health. Regular exercise means any activity that makes your heart beat faster and makes you sweat.  We recommend exercising at least 30 minutes per day at least 3 days a week, preferably 4 or 5.  We also recommend a diet low in fat and sugar.  Inactivity, poor dietary choices and obesity can cause diabetes, heart attack, stroke, and kidney damage, among others.    ALCOHOL AND SMOKING:  Women should limit their alcohol intake to no more than 7 drinks/beers/glasses of wine (combined, not each!) per week. Moderation of alcohol intake to this level decreases your risk of breast cancer and liver damage. And of course, no recreational drugs are part of a healthy lifestyle.  And absolutely no smoking or even second hand smoke. Most people know smoking can cause heart and lung diseases, but did you know it also contributes to weakening of your bones? Aging of your skin?  Yellowing of your teeth and nails?  CALCIUM AND VITAMIN D:  Adequate intake of calcium and Vitamin D are recommended.  The recommendations for exact amounts of these supplements seem to change often, but generally speaking 600 mg of calcium (either carbonate or citrate) and 800 units of Vitamin D per day seems prudent. Certain women may benefit from higher intake of Vitamin D.  If you are among these women, your doctor will have told you during your visit.    PAP SMEARS:  Pap smears, to check for cervical cancer or precancers,  have traditionally been done yearly, although recent scientific advances have shown that most women can have pap smears less often.  However, every woman still should have a physical exam from her gynecologist every year. It will include a breast check, inspection of the vulva and vagina to check for abnormal growths or skin changes, a visual exam of the  cervix, and then an exam to evaluate the size and shape of the uterus and ovaries.  And after 68 years of age, a rectal exam is indicated to check for rectal cancers. We will also provide age appropriate advice regarding health maintenance, like when you should have certain vaccines, screening for sexually transmitted diseases, bone density testing, colonoscopy, mammograms, etc.   MAMMOGRAMS:  All women over 6 years old should have a yearly mammogram. Many facilities now offer a "3D" mammogram, which may cost around $50 extra out of pocket. If possible,  we recommend you accept the option to have the 3D mammogram performed.  It both reduces the number of women who will be called back for extra views which then turn out to be normal, and it is better than the routine mammogram at detecting truly abnormal areas.    COLONOSCOPY:  Colonoscopy to screen for colon cancer is recommended for all women at age 70.  We know, you hate the idea of the prep.  We agree, BUT, having colon cancer and not knowing it is worse!!  Colon cancer so often starts as a polyp that can be seen and removed at colonscopy, which can quite literally save your life!  And if your first colonoscopy is normal and you have no family history of colon cancer, most women don't have to have it again for 10 years.  Once every ten years, you can do something that may end up saving your life, right?  We will  be happy to help you get it scheduled when you are ready.  Be sure to check your insurance coverage so you understand how much it will cost.  It may be covered as a preventative service at no cost, but you should check your particular policy.

## 2024-01-17 LAB — CERVICOVAGINAL ANCILLARY ONLY
Bacterial Vaginitis (gardnerella): NEGATIVE
Candida Glabrata: NEGATIVE
Candida Vaginitis: NEGATIVE
Comment: NEGATIVE
Comment: NEGATIVE
Comment: NEGATIVE
Comment: NEGATIVE
Trichomonas: NEGATIVE

## 2024-01-17 LAB — HEPATITIS C ANTIBODY: Hepatitis C Ab: NONREACTIVE

## 2024-01-17 LAB — HIV ANTIBODY (ROUTINE TESTING W REFLEX)
HIV 1&2 Ab, 4th Generation: NONREACTIVE
HIV FINAL INTERPRETATION: NEGATIVE

## 2024-01-17 LAB — RPR: RPR Ser Ql: NONREACTIVE

## 2024-01-19 ENCOUNTER — Ambulatory Visit: Payer: Self-pay | Admitting: Obstetrics and Gynecology

## 2024-01-19 DIAGNOSIS — R87622 Low grade squamous intraepithelial lesion on cytologic smear of vagina (LGSIL): Secondary | ICD-10-CM

## 2024-01-19 DIAGNOSIS — R87612 Low grade squamous intraepithelial lesion on cytologic smear of cervix (LGSIL): Secondary | ICD-10-CM

## 2024-01-19 LAB — CYTOLOGY - PAP
Comment: NEGATIVE
High risk HPV: NEGATIVE

## 2024-01-22 NOTE — Addendum Note (Signed)
 Addended by: VICCI ARVIN PARAS on: 01/22/2024 09:40 AM   Modules accepted: Orders

## 2024-01-23 LAB — COLOGUARD

## 2024-01-31 DIAGNOSIS — Z1211 Encounter for screening for malignant neoplasm of colon: Secondary | ICD-10-CM | POA: Diagnosis not present

## 2024-02-04 LAB — COLOGUARD: COLOGUARD: NEGATIVE

## 2024-02-05 ENCOUNTER — Inpatient Hospital Stay: Admission: RE | Admit: 2024-02-05 | Discharge: 2024-02-05 | Attending: Hematology and Oncology

## 2024-02-05 DIAGNOSIS — Z170 Estrogen receptor positive status [ER+]: Secondary | ICD-10-CM

## 2024-02-12 ENCOUNTER — Other Ambulatory Visit: Payer: Self-pay | Admitting: Hematology and Oncology

## 2024-02-12 DIAGNOSIS — R928 Other abnormal and inconclusive findings on diagnostic imaging of breast: Secondary | ICD-10-CM

## 2024-02-13 ENCOUNTER — Telehealth: Payer: Self-pay | Admitting: Hematology and Oncology

## 2024-02-13 NOTE — Telephone Encounter (Signed)
 I spoke with the pt regarding 06/24/24 appt being rescheduled to 07/02/24. Patient is aware of new appt date and time.

## 2024-02-20 ENCOUNTER — Other Ambulatory Visit

## 2024-02-20 ENCOUNTER — Ambulatory Visit
Admission: RE | Admit: 2024-02-20 | Discharge: 2024-02-20 | Disposition: A | Discharge status: Home / Self Care | Source: Ambulatory Visit | Attending: Hematology and Oncology | Admitting: Hematology and Oncology

## 2024-02-20 DIAGNOSIS — R928 Other abnormal and inconclusive findings on diagnostic imaging of breast: Secondary | ICD-10-CM

## 2024-03-15 ENCOUNTER — Encounter: Payer: Self-pay | Admitting: Physician Assistant

## 2024-03-15 ENCOUNTER — Other Ambulatory Visit: Payer: Self-pay | Admitting: Physician Assistant

## 2024-03-15 MED ORDER — SCOPOLAMINE 1 MG/3DAYS TD PT72: 1.0000 | MEDICATED_PATCH | TRANSDERMAL | 12 refills | Status: AC

## 2024-03-19 ENCOUNTER — Other Ambulatory Visit: Payer: Self-pay | Admitting: Obstetrics and Gynecology

## 2024-03-19 DIAGNOSIS — B009 Herpesviral infection, unspecified: Secondary | ICD-10-CM

## 2024-03-19 NOTE — Telephone Encounter (Signed)
 Med refill request: Acyclovir  (Zovirax ) 400 mg tablet Last AEX: 01/16/24 BS Next AEX: 01/28/25 BS Last MMG (if hormonal med) 02/20/24 Refill authorized: Please Advise? Last Rx sent #[id number] with 1 refill on 09/20/23 [facility]

## 2024-03-25 ENCOUNTER — Encounter: Admitting: Obstetrics and Gynecology

## 2024-04-09 ENCOUNTER — Encounter: Admitting: Obstetrics and Gynecology

## 2024-04-30 ENCOUNTER — Encounter: Admitting: Obstetrics and Gynecology

## 2024-05-20 ENCOUNTER — Ambulatory Visit: Admitting: Internal Medicine

## 2024-06-24 ENCOUNTER — Ambulatory Visit: Admitting: Hematology and Oncology

## 2024-07-02 ENCOUNTER — Inpatient Hospital Stay: Admitting: Hematology and Oncology

## 2024-07-22 ENCOUNTER — Ambulatory Visit

## 2025-01-28 ENCOUNTER — Encounter: Admitting: Obstetrics and Gynecology
